# Patient Record
Sex: Female | Born: 1935 | Race: White | Hispanic: No | Smoking: Former smoker
Health system: Southern US, Community
[De-identification: ages and names within clinical notes are randomized; demographics above are authoritative.]

## PROBLEM LIST (undated history)

## (undated) DIAGNOSIS — I350 Nonrheumatic aortic (valve) stenosis: Secondary | ICD-10-CM

## (undated) DIAGNOSIS — I10 Essential (primary) hypertension: Secondary | ICD-10-CM

## (undated) DIAGNOSIS — R0600 Dyspnea, unspecified: Secondary | ICD-10-CM

## (undated) DIAGNOSIS — T7840XA Allergy, unspecified, initial encounter: Secondary | ICD-10-CM

## (undated) DIAGNOSIS — F429 Obsessive-compulsive disorder, unspecified: Secondary | ICD-10-CM

## (undated) DIAGNOSIS — F419 Anxiety disorder, unspecified: Secondary | ICD-10-CM

## (undated) DIAGNOSIS — I639 Cerebral infarction, unspecified: Secondary | ICD-10-CM

## (undated) DIAGNOSIS — R748 Abnormal levels of other serum enzymes: Secondary | ICD-10-CM

## (undated) DIAGNOSIS — W19XXXA Unspecified fall, initial encounter: Secondary | ICD-10-CM

## (undated) DIAGNOSIS — I951 Orthostatic hypotension: Secondary | ICD-10-CM

## (undated) DIAGNOSIS — I6529 Occlusion and stenosis of unspecified carotid artery: Secondary | ICD-10-CM

## (undated) DIAGNOSIS — J471 Bronchiectasis with (acute) exacerbation: Secondary | ICD-10-CM

## (undated) DIAGNOSIS — I2510 Atherosclerotic heart disease of native coronary artery without angina pectoris: Secondary | ICD-10-CM

## (undated) DIAGNOSIS — I0981 Rheumatic heart failure: Secondary | ICD-10-CM

## (undated) DIAGNOSIS — R42 Dizziness and giddiness: Secondary | ICD-10-CM

## (undated) DIAGNOSIS — I5181 Takotsubo syndrome: Secondary | ICD-10-CM

## (undated) DIAGNOSIS — J302 Other seasonal allergic rhinitis: Secondary | ICD-10-CM

## (undated) DIAGNOSIS — E785 Hyperlipidemia, unspecified: Secondary | ICD-10-CM

## (undated) DIAGNOSIS — I480 Paroxysmal atrial fibrillation: Secondary | ICD-10-CM

## (undated) DIAGNOSIS — K219 Gastro-esophageal reflux disease without esophagitis: Secondary | ICD-10-CM

## (undated) DIAGNOSIS — M1990 Unspecified osteoarthritis, unspecified site: Secondary | ICD-10-CM

## (undated) DIAGNOSIS — R011 Cardiac murmur, unspecified: Secondary | ICD-10-CM

## (undated) DIAGNOSIS — N289 Disorder of kidney and ureter, unspecified: Secondary | ICD-10-CM

## (undated) DIAGNOSIS — K649 Unspecified hemorrhoids: Secondary | ICD-10-CM

## (undated) DIAGNOSIS — K7469 Other cirrhosis of liver: Secondary | ICD-10-CM

## (undated) DIAGNOSIS — Z9289 Personal history of other medical treatment: Secondary | ICD-10-CM

## (undated) DIAGNOSIS — R5381 Other malaise: Secondary | ICD-10-CM

## (undated) DIAGNOSIS — I3139 Other pericardial effusion (noninflammatory): Secondary | ICD-10-CM

## (undated) DIAGNOSIS — E8770 Fluid overload, unspecified: Secondary | ICD-10-CM

## (undated) DIAGNOSIS — F0631 Mood disorder due to known physiological condition with depressive features: Secondary | ICD-10-CM

## (undated) HISTORY — DX: Other malaise: R53.81

## (undated) HISTORY — DX: Cardiac murmur, unspecified: R01.1

## (undated) HISTORY — DX: Unspecified fall, initial encounter: W19.XXXA

## (undated) HISTORY — DX: Obsessive-compulsive disorder, unspecified: F42.9

## (undated) HISTORY — DX: Dizziness and giddiness: R42

## (undated) HISTORY — DX: Other cirrhosis of liver: K74.69

## (undated) HISTORY — DX: Rheumatic heart failure: I09.81

## (undated) HISTORY — DX: Cerebral infarction, unspecified: I63.9

## (undated) HISTORY — DX: Abnormal levels of other serum enzymes: R74.8

## (undated) HISTORY — DX: Other pericardial effusion (noninflammatory): I31.39

## (undated) HISTORY — DX: Personal history of other medical treatment: Z92.89

## (undated) HISTORY — DX: Mood disorder due to known physiological condition with depressive features: F06.31

## (undated) HISTORY — DX: Disorder of kidney and ureter, unspecified: N28.9

## (undated) HISTORY — DX: Bronchiectasis with (acute) exacerbation: J47.1

## (undated) HISTORY — DX: Nonrheumatic aortic (valve) stenosis: I35.0

## (undated) HISTORY — DX: Occlusion and stenosis of unspecified carotid artery: I65.29

## (undated) HISTORY — PX: COLONOSCOPY: SHX174

## (undated) HISTORY — PX: UPPER GI ENDOSCOPY: SHX6162

## (undated) HISTORY — DX: Allergy, unspecified, initial encounter: T78.40XA

## (undated) HISTORY — DX: Orthostatic hypotension: I95.1

## (undated) HISTORY — PX: TONSILLECTOMY: SUR1361

## (undated) HISTORY — DX: Fluid overload, unspecified: E87.70

## (undated) HISTORY — PX: EYE SURGERY: SHX253

---

## 2007-10-24 ENCOUNTER — Encounter: Admission: RE | Admit: 2007-10-24 | Discharge: 2007-10-24 | Payer: Self-pay | Admitting: Family Medicine

## 2008-09-15 ENCOUNTER — Ambulatory Visit: Payer: Self-pay | Admitting: Diagnostic Radiology

## 2008-09-15 ENCOUNTER — Ambulatory Visit (HOSPITAL_BASED_OUTPATIENT_CLINIC_OR_DEPARTMENT_OTHER): Admission: RE | Admit: 2008-09-15 | Discharge: 2008-09-15 | Payer: Self-pay | Admitting: Family Medicine

## 2008-10-20 ENCOUNTER — Ambulatory Visit: Payer: Self-pay | Admitting: Radiology

## 2008-10-20 ENCOUNTER — Ambulatory Visit (HOSPITAL_BASED_OUTPATIENT_CLINIC_OR_DEPARTMENT_OTHER): Admission: RE | Admit: 2008-10-20 | Discharge: 2008-10-20 | Payer: Self-pay | Admitting: Family Medicine

## 2009-10-26 ENCOUNTER — Ambulatory Visit: Payer: Self-pay | Admitting: Diagnostic Radiology

## 2009-10-26 ENCOUNTER — Ambulatory Visit (HOSPITAL_BASED_OUTPATIENT_CLINIC_OR_DEPARTMENT_OTHER): Admission: RE | Admit: 2009-10-26 | Discharge: 2009-10-26 | Payer: Self-pay | Admitting: Family Medicine

## 2010-10-06 ENCOUNTER — Ambulatory Visit (HOSPITAL_BASED_OUTPATIENT_CLINIC_OR_DEPARTMENT_OTHER)
Admission: RE | Admit: 2010-10-06 | Discharge: 2010-10-06 | Disposition: A | Discharge status: Home / Self Care | Payer: Medicare Other | Source: Ambulatory Visit | Attending: Specialist | Admitting: Specialist

## 2010-10-06 ENCOUNTER — Ambulatory Visit (HOSPITAL_COMMUNITY): Admission: RE | Admit: 2010-10-06 | Payer: Medicare Other | Source: Ambulatory Visit

## 2010-10-06 DIAGNOSIS — Z01810 Encounter for preprocedural cardiovascular examination: Secondary | ICD-10-CM | POA: Insufficient documentation

## 2010-10-06 DIAGNOSIS — H269 Unspecified cataract: Secondary | ICD-10-CM | POA: Insufficient documentation

## 2010-10-06 DIAGNOSIS — Z01812 Encounter for preprocedural laboratory examination: Secondary | ICD-10-CM | POA: Insufficient documentation

## 2010-10-06 DIAGNOSIS — Z79899 Other long term (current) drug therapy: Secondary | ICD-10-CM | POA: Insufficient documentation

## 2010-10-06 DIAGNOSIS — I10 Essential (primary) hypertension: Secondary | ICD-10-CM | POA: Insufficient documentation

## 2010-10-06 LAB — POCT I-STAT 4, (NA,K, GLUC, HGB,HCT)
HCT: 38 % (ref 36.0–46.0)
Sodium: 137 mEq/L (ref 135–145)

## 2010-10-14 ENCOUNTER — Other Ambulatory Visit (HOSPITAL_BASED_OUTPATIENT_CLINIC_OR_DEPARTMENT_OTHER): Payer: Self-pay | Admitting: Family Medicine

## 2010-10-14 DIAGNOSIS — Z1239 Encounter for other screening for malignant neoplasm of breast: Secondary | ICD-10-CM

## 2010-11-01 ENCOUNTER — Ambulatory Visit (HOSPITAL_BASED_OUTPATIENT_CLINIC_OR_DEPARTMENT_OTHER)
Admission: RE | Admit: 2010-11-01 | Discharge: 2010-11-01 | Disposition: A | Discharge status: Home / Self Care | Payer: Medicare Other | Source: Ambulatory Visit | Attending: Family Medicine | Admitting: Family Medicine

## 2010-11-01 DIAGNOSIS — Z1231 Encounter for screening mammogram for malignant neoplasm of breast: Secondary | ICD-10-CM | POA: Insufficient documentation

## 2010-11-01 DIAGNOSIS — Z1239 Encounter for other screening for malignant neoplasm of breast: Secondary | ICD-10-CM

## 2010-11-10 ENCOUNTER — Ambulatory Visit (HOSPITAL_BASED_OUTPATIENT_CLINIC_OR_DEPARTMENT_OTHER)
Admission: RE | Admit: 2010-11-10 | Discharge: 2010-11-10 | Disposition: A | Discharge status: Home / Self Care | Payer: Medicare Other | Source: Ambulatory Visit | Attending: Specialist | Admitting: Specialist

## 2010-11-10 DIAGNOSIS — H269 Unspecified cataract: Secondary | ICD-10-CM | POA: Insufficient documentation

## 2011-02-18 NOTE — Op Note (Signed)
  NAME:  Barbara Mcconnell, Barbara Mcconnell NO.:  [id number]  MEDICAL RECORD NO.:  [id number]          PATIENT TYPE:  LOCATION:                                 FACILITY:  PHYSICIAN:  Chucky May, M.D.  DATE OF BIRTH:  Feb 21, 1935  DATE OF PROCEDURE: DATE OF DISCHARGE:                              OPERATIVE REPORT   SURGEON:  Chucky May, M.D.  ANESTHESIA:  MAC.  PREOPERATIVE DIAGNOSIS:  Cataract, left eye.  POSTOPERATIVE DIAGNOSIS:  Cataract, left eye.  OPERATION PERFORMED:  Cataract extraction by phacoemulsification with intraocular lens implant, left eye.  INDICATIONS FOR SURGERY:  The patient is a 76 year old female with painless progressive decrease in vision so that she has difficulty seeing for driving and reading.  On examination, she was found to have a cataract consistent with decrease in visual acuity.  DESCRIPTION OF PROCEDURE:  The patient was brought to the main operating room and placed in the supine position.  Anesthesia was obtained by means of topical 4% lidocaine drops and tetracaine.  Additional intravenous sedation was given by Anesthesia.  A lid speculum was inserted and the conjunctiva was irrigated with Balanced Salt Solution. The cornea was entered temporally with a 2.4-mm keratome with an additional port inferiorly.  Viscoelastic was instilled into the anterior chamber and an anterior capsulorrhexis was performed without difficulty.  The nucleus was then mobilized by hydrodissection followed by phacoemulsification of the nucleus and removal of residual cortical material by irrigation-aspiration.  The posterior capsule was then polished and a posterior chamber lens implant was placed in the bag, model [id number], power 22.0 diopters, without difficulty.  Residual viscoelastic was then removed by irrigation-aspiration and replaced with Balanced Salt Solution.  The wound was hydrated with Balanced Salt Solution and checked for fluid leaks and none  were noted.  The eye was dressed with topical Pred Forte, Vigamox and a Fox shield, and the patient was taken to the recovery room where she received written and verbal instructions for postoperative care and was scheduled for followup in 24 hours.          ______________________________ Chucky May, M.D.     DJD/MEDQ  D:  11/10/2010  T:  11/11/2010  Job:  [id number]  Electronically Signed by Nelson Chimes M.D. on 02/18/2011 08:58:12 AM

## 2011-04-19 ENCOUNTER — Encounter: Payer: Self-pay | Admitting: *Deleted

## 2011-04-19 ENCOUNTER — Emergency Department (HOSPITAL_BASED_OUTPATIENT_CLINIC_OR_DEPARTMENT_OTHER)
Admission: EM | Admit: 2011-04-19 | Discharge: 2011-04-19 | Disposition: A | Discharge status: Home / Self Care | Payer: Medicare Other | Attending: Emergency Medicine | Admitting: Emergency Medicine

## 2011-04-19 ENCOUNTER — Emergency Department (INDEPENDENT_AMBULATORY_CARE_PROVIDER_SITE_OTHER): Payer: Medicare Other

## 2011-04-19 DIAGNOSIS — M25569 Pain in unspecified knee: Secondary | ICD-10-CM | POA: Insufficient documentation

## 2011-04-19 DIAGNOSIS — I10 Essential (primary) hypertension: Secondary | ICD-10-CM | POA: Insufficient documentation

## 2011-04-19 DIAGNOSIS — IMO0002 Reserved for concepts with insufficient information to code with codable children: Secondary | ICD-10-CM

## 2011-04-19 DIAGNOSIS — M1711 Unilateral primary osteoarthritis, right knee: Secondary | ICD-10-CM

## 2011-04-19 DIAGNOSIS — E785 Hyperlipidemia, unspecified: Secondary | ICD-10-CM | POA: Insufficient documentation

## 2011-04-19 DIAGNOSIS — M1710 Unilateral primary osteoarthritis, unspecified knee: Secondary | ICD-10-CM

## 2011-04-19 DIAGNOSIS — M1120 Other chondrocalcinosis, unspecified site: Secondary | ICD-10-CM

## 2011-04-19 HISTORY — DX: Essential (primary) hypertension: I10

## 2011-04-19 HISTORY — DX: Hyperlipidemia, unspecified: E78.5

## 2011-04-19 NOTE — ED Notes (Signed)
Pt c/o right knee pain x 3 weeks w/o injury.

## 2011-04-19 NOTE — ED Provider Notes (Signed)
History     CSN: [id number] Arrival date & time: 04/19/2011  3:14 PM  Chief Complaint  Patient presents with  . Knee Pain    HPI  (Consider location/radiation/quality/duration/timing/severity/associated sxs/prior treatment)  HPI This is a 76 year old female comes in today complaining of right knee pain. She states that she has had increasing pain over several weeks. It is more severe today. She has not had any known trauma. She feels that the pain is in the medial aspect of the knee. She has not had any redness but feels that there may be some swelling. Pain when she puts weight on the leg. He denies any calf swelling or foot edema. She has no history of DVT.  Past Medical History  Diagnosis Date  . Hypertension   . Hyperlipemia     Past Surgical History  Procedure Date  . Tonsillectomy     History reviewed. No pertinent family history.  History  Substance Use Topics  . Smoking status: Never Smoker   . Smokeless tobacco: Not on file  . Alcohol Use: No    OB History    Grav Para Term Preterm Abortions TAB SAB Ect Mult Living                  Review of Systems  Review of Systems  All other systems reviewed and are negative.    Allergies  Review of patient's allergies indicates no known allergies.  Home Medications   Current Outpatient Rx  Name Route Sig Dispense Refill  . FEXOFENADINE HCL 180 MG PO TABS Oral Take 180 mg by mouth daily.      . IBUPROFEN 200 MG PO TABS Oral Take 200 mg by mouth every 6 (six) hours as needed. For pain     . LISINOPRIL-HYDROCHLOROTHIAZIDE 20-25 MG PO TABS Oral Take 1 tablet by mouth daily.      Marland Kitchen METOPROLOL SUCCINATE 25 MG PO TB24 Oral Take 25 mg by mouth daily.      Marland Kitchen ONE-DAILY MULTI VITAMINS PO TABS Oral Take 1 tablet by mouth daily.      Marland Kitchen FISH OIL 1200 MG PO CAPS Oral Take 1 capsule by mouth daily.      Marland Kitchen ROSUVASTATIN CALCIUM 10 MG PO TABS Oral Take 10 mg by mouth daily.      Marland Kitchen VISINE OP Ophthalmic Apply 1 drop to eye  daily.        Physical Exam    BP 160/86  Pulse 72  Temp(Src) 98.7 F (37.1 C) (Oral)  Resp 16  Ht 5\' 1"  (1.549 m)  Wt 145 lb (65.772 kg)  BMI 27.40 kg/m2  SpO2 99%  Physical Exam  Well-developed well-nourished 76 year old female sitting on the bed in no acute distress vital signs are stable except for blood pressure elevated at 160/86 HEENT normocephalic atraumatic neck is supple nontender to palpation Chest wall is normal lungs are clear auscultation Heart is regular rate and rhythm Abdomen is soft and nontender Extremities are normal with the exception of some tenderness over the medial aspect of the right knee. Effusion is noted. There is no tenderness of the calf or the medial aspect of the right thigh. She has full active range of motion of the knee. She has pain with no collateral ligament.  ED Course  Procedures (including critical care time)  Labs Reviewed - No data to display Dg Knee 1-2 Views Right  04/19/2011  *RADIOLOGY REPORT*  Clinical Data: Anterior and medial right knee pain, no injury  RIGHT KNEE - 1-2 VIEW  Comparison: None.  Findings:  The AP radiograph is limited due to obliquity.  No fracture or dislocation. Minimal medial compartmental joint space degenerative change and spurring of the tibial spines.  Chondrocalcinosis within the medial and lateral joint spaces as well as the patellofemoral joint.  No definite joint effusion.  Minimal enthesopathic changes of the insertion site of the quadriceps tendon.  Regional soft tissues are normal.  IMPRESSION: 1.  Minimal medial compartmental joint space degenerative change.  2.  Chondrocalcinosis compatible with CPPD.  Original Report Authenticated By: Waynard Reeds, M.D.     No diagnosis found.   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 She is advised regarding this and treatment. She is given a referral for followup.         Hilario Quarry, MD 04/19/11 1800

## 2011-10-12 ENCOUNTER — Other Ambulatory Visit (HOSPITAL_BASED_OUTPATIENT_CLINIC_OR_DEPARTMENT_OTHER): Payer: Self-pay | Admitting: Family Medicine

## 2011-10-12 DIAGNOSIS — Z1231 Encounter for screening mammogram for malignant neoplasm of breast: Secondary | ICD-10-CM

## 2011-11-14 ENCOUNTER — Ambulatory Visit (HOSPITAL_BASED_OUTPATIENT_CLINIC_OR_DEPARTMENT_OTHER)
Admission: RE | Admit: 2011-11-14 | Discharge: 2011-11-14 | Disposition: A | Discharge status: Home / Self Care | Payer: Medicare Other | Source: Ambulatory Visit | Attending: Family Medicine | Admitting: Family Medicine

## 2011-11-14 DIAGNOSIS — Z1231 Encounter for screening mammogram for malignant neoplasm of breast: Secondary | ICD-10-CM | POA: Insufficient documentation

## 2012-10-15 ENCOUNTER — Other Ambulatory Visit: Payer: Self-pay

## 2012-10-15 DIAGNOSIS — Z1231 Encounter for screening mammogram for malignant neoplasm of breast: Secondary | ICD-10-CM

## 2012-11-27 ENCOUNTER — Ambulatory Visit
Admission: RE | Admit: 2012-11-27 | Discharge: 2012-11-27 | Disposition: A | Discharge status: Home / Self Care | Payer: Medicare Other | Source: Ambulatory Visit

## 2012-11-27 DIAGNOSIS — Z1231 Encounter for screening mammogram for malignant neoplasm of breast: Secondary | ICD-10-CM

## 2013-05-11 ENCOUNTER — Encounter (HOSPITAL_BASED_OUTPATIENT_CLINIC_OR_DEPARTMENT_OTHER): Payer: Self-pay | Admitting: Emergency Medicine

## 2013-05-11 ENCOUNTER — Emergency Department (HOSPITAL_BASED_OUTPATIENT_CLINIC_OR_DEPARTMENT_OTHER)
Admission: EM | Admit: 2013-05-11 | Discharge: 2013-05-11 | Disposition: A | Discharge status: Skilled Nursing Facility | Payer: Medicare Other | Attending: Emergency Medicine | Admitting: Emergency Medicine

## 2013-05-11 ENCOUNTER — Emergency Department (HOSPITAL_BASED_OUTPATIENT_CLINIC_OR_DEPARTMENT_OTHER): Payer: Medicare Other

## 2013-05-11 DIAGNOSIS — E785 Hyperlipidemia, unspecified: Secondary | ICD-10-CM | POA: Insufficient documentation

## 2013-05-11 DIAGNOSIS — I10 Essential (primary) hypertension: Secondary | ICD-10-CM | POA: Insufficient documentation

## 2013-05-11 DIAGNOSIS — R6883 Chills (without fever): Secondary | ICD-10-CM | POA: Insufficient documentation

## 2013-05-11 DIAGNOSIS — I219 Acute myocardial infarction, unspecified: Secondary | ICD-10-CM | POA: Insufficient documentation

## 2013-05-11 DIAGNOSIS — R42 Dizziness and giddiness: Secondary | ICD-10-CM | POA: Insufficient documentation

## 2013-05-11 DIAGNOSIS — I214 Non-ST elevation (NSTEMI) myocardial infarction: Secondary | ICD-10-CM

## 2013-05-11 DIAGNOSIS — Z79899 Other long term (current) drug therapy: Secondary | ICD-10-CM | POA: Insufficient documentation

## 2013-05-11 DIAGNOSIS — R0789 Other chest pain: Secondary | ICD-10-CM | POA: Insufficient documentation

## 2013-05-11 DIAGNOSIS — R110 Nausea: Secondary | ICD-10-CM | POA: Insufficient documentation

## 2013-05-11 LAB — PROTIME-INR: Prothrombin Time: 12.6 seconds (ref 11.6–15.2)

## 2013-05-11 LAB — CBC WITH DIFFERENTIAL/PLATELET
Basophils Relative: 0 % (ref 0–1)
Eosinophils Absolute: 0.2 10*3/uL (ref 0.0–0.7)
Hemoglobin: 12 g/dL (ref 12.0–15.0)
MCH: 30.7 pg (ref 26.0–34.0)
MCHC: 34.6 g/dL (ref 30.0–36.0)
MCV: 88.7 fL (ref 78.0–100.0)
Monocytes Relative: 8 % (ref 3–12)
Neutro Abs: 3.6 10*3/uL (ref 1.7–7.7)
WBC: 7.1 10*3/uL (ref 4.0–10.5)

## 2013-05-11 LAB — COMPREHENSIVE METABOLIC PANEL
ALT: 26 U/L (ref 0–35)
AST: 29 U/L (ref 0–37)
Albumin: 4.1 g/dL (ref 3.5–5.2)
Alkaline Phosphatase: 39 U/L (ref 39–117)
BUN: 52 mg/dL — ABNORMAL HIGH (ref 6–23)
CO2: 23 mEq/L (ref 19–32)
Chloride: 99 mEq/L (ref 96–112)
Creatinine, Ser: 1.7 mg/dL — ABNORMAL HIGH (ref 0.50–1.10)
GFR calc non Af Amer: 28 mL/min — ABNORMAL LOW (ref >90)

## 2013-05-11 MED ORDER — HEPARIN (PORCINE) IN NACL 100-0.45 UNIT/ML-% IJ SOLN
INTRAMUSCULAR | Status: AC
  Administered 2013-05-11: 1000 [IU]/h
  Filled 2013-05-11: qty 250

## 2013-05-11 MED ORDER — SODIUM CHLORIDE 0.9 % IV BOLUS (SEPSIS)
500.0000 mL | Freq: Once | INTRAVENOUS | Status: AC
  Administered 2013-05-11: 500 mL via INTRAVENOUS

## 2013-05-11 MED ORDER — SODIUM CHLORIDE 0.9 % IV SOLN
INTRAVENOUS | Status: DC
  Administered 2013-05-11: 20:00:00 via INTRAVENOUS

## 2013-05-11 MED ORDER — ONDANSETRON HCL 4 MG/2ML IJ SOLN
INTRAMUSCULAR | Status: AC
  Administered 2013-05-11: 4 mg
  Filled 2013-05-11: qty 2

## 2013-05-11 NOTE — ED Notes (Signed)
Lab called and states troponin is 0.57. Dr. Radford Pax aware.

## 2013-05-11 NOTE — ED Notes (Signed)
Condition stable and unchanged stable denies pain or SOB skin pale pink warm and dry pt alert x4 NSR on monitor IV site WNL heparin infusing at 1000 units per hr

## 2013-05-11 NOTE — ED Notes (Signed)
Pt states she has had heart palpitations all day. Also c/o cold sweat and nausea.

## 2013-05-11 NOTE — ED Provider Notes (Signed)
CSN: [id number]     Arrival date & time 05/11/13  1903 History  This chart was scribed for Barbara Shi, MD by Danella Maiers, ED Scribe. This patient was seen in room MH02/MH02 and the patient's care was started at 7:13 PM.   Chief Complaint  Patient presents with  . Palpitations   HPI HPI Comments: Loetta Connelley is a 78 y.o. female with a h/o hypertension who presents to the Emergency Department complaining of sudden-onset heart palpitations with associated chills and nausea after getting out of the shower tonight. She reports feeling tightness in her chest and posterior neck and feeling like she was going to pass out. She states she was feeling great all day until after she got out of the shower. Her symptoms have since improved but she still feels "weak". She has been working outside the last two days. She has never felt this way before. She has no h/o heart problems. She is compliant with her blood pressure medications.  Past Medical History  Diagnosis Date  . Hypertension   . Hyperlipemia    Past Surgical History  Procedure Laterality Date  . Tonsillectomy     History reviewed. No pertinent family history. History  Substance Use Topics  . Smoking status: Never Smoker   . Smokeless tobacco: Not on file  . Alcohol Use: No   OB History   Grav Para Term Preterm Abortions TAB SAB Ect Mult Living                 Review of Systems A complete 10 system review of systems was obtained and all systems are negative except as noted in the HPI and PMH.   Allergies  Review of patient's allergies indicates no known allergies.  Home Medications   Current Outpatient Rx  Name  Route  Sig  Dispense  Refill  . fexofenadine (ALLEGRA) 180 MG tablet   Oral   Take 180 mg by mouth daily.           Marland Kitchen ibuprofen (ADVIL,MOTRIN) 200 MG tablet   Oral   Take 200 mg by mouth every 6 (six) hours as needed. For pain          . lisinopril-hydrochlorothiazide (PRINZIDE,ZESTORETIC) 20-25 MG  per tablet   Oral   Take 1 tablet by mouth daily.           . metoprolol succinate (TOPROL-XL) 25 MG 24 hr tablet   Oral   Take 25 mg by mouth daily.           . Multiple Vitamin (MULTIVITAMIN) tablet   Oral   Take 1 tablet by mouth daily.           . Omega-3 Fatty Acids (FISH OIL) 1200 MG CAPS   Oral   Take 1 capsule by mouth daily.           . rosuvastatin (CRESTOR) 10 MG tablet   Oral   Take 10 mg by mouth daily.           . Tetrahydrozoline HCl (VISINE OP)   Ophthalmic   Apply 1 drop to eye daily.            BP 117/90  Pulse 96  Temp(Src) 98.6 F (37 C) (Oral)  Resp 20  Ht 5\' 2"  (1.575 m)  Wt 145 lb (65.772 kg)  BMI 26.51 kg/m2  SpO2 99% Physical Exam  Nursing note and vitals reviewed. Constitutional: She is oriented to person, place, and time. She  appears well-developed and well-nourished. No distress.  HENT:  Head: Normocephalic and atraumatic.  Eyes: Pupils are equal, round, and reactive to light.  Neck: Normal range of motion.  Cardiovascular: Normal rate and intact distal pulses.   Pulmonary/Chest: No respiratory distress. She has no wheezes. She has no rales.  Abdominal: Normal appearance. She exhibits no distension.  Musculoskeletal: Normal range of motion.  Neurological: She is alert and oriented to person, place, and time. No cranial nerve deficit.  Skin: Skin is warm and dry. No rash noted.  Psychiatric: She has a normal mood and affect. Her behavior is normal.    ED Course  Procedures (including critical care time)  CRITICAL CARE Performed by: Nelva Nay L Total critical care time: 45 min Critical care time was exclusive of separately billable procedures and treating other patients. Critical care was necessary to treat or prevent imminent or life-threatening deterioration. Critical care was time spent personally by me on the following activities: development of treatment plan with patient and/or surrogate as well as nursing,  discussions with consultants, evaluation of patient's response to treatment, examination of patient, obtaining history from patient or surrogate, ordering and performing treatments and interventions, ordering and review of laboratory studies, ordering and review of radiographic studies, pulse oximetry and re-evaluation of patient's condition.  Medications  0.9 %  sodium chloride infusion ( Intravenous New Bag/Given 05/11/13 2001)  sodium chloride 0.9 % bolus 500 mL (500 mLs Intravenous New Bag/Given 05/11/13 2001)  ondansetron (ZOFRAN) 4 MG/2ML injection (4 mg  Given 05/11/13 2018)  heparin 100-0.45 UNIT/ML-% infusion (1,000 Units/hr  New Bag/Given 05/11/13 2050)     DIAGNOSTIC STUDIES: Oxygen Saturation is 99% on RA, normal by my interpretation.    COORDINATION OF CARE: 7:28 PM- Discussed treatment plan with pt which includes blood work, orthostatics, IV fluids. Pt agrees to plan.    Labs Review Labs Reviewed  COMPREHENSIVE METABOLIC PANEL - Abnormal; Notable for the following:    Glucose, Bld 123 (*)    BUN 52 (*)    Creatinine, Ser 1.70 (*)    GFR calc non Af Amer 28 (*)    GFR calc Af Amer 33 (*)    All other components within normal limits  CBC WITH DIFFERENTIAL - Abnormal; Notable for the following:    HCT 34.7 (*)    All other components within normal limits  TROPONIN I - Abnormal; Notable for the following:    Troponin I 0.57 (*)    All other components within normal limits  PROTIME-INR   Imaging Review No results found.   Date: 05/11/2013  Rate: 76  Rhythm: normal sinus rhythm  QRS Axis: normal  Intervals: normal  ST/T Wave abnormalities: normal  Conduction Disutrbances: none  Narrative Interpretation: unremarkable  Repeat EKG prior to discharge showed no change in ST segments.   Case was discussed with cardiologist at [facility].  Patient will be transferred there at her request.  Patient given heparin IV prior to transfer.   MDM   1.  NSTEMI (non-ST elevated myocardial infarction)        I personally performed the services described in this documentation, which was scribed in my presence. The recorded information has been reviewed and considered.   Barbara Shi, MD 05/17/13 2007

## 2013-09-23 ENCOUNTER — Emergency Department (HOSPITAL_BASED_OUTPATIENT_CLINIC_OR_DEPARTMENT_OTHER): Payer: Medicare HMO

## 2013-09-23 ENCOUNTER — Emergency Department (HOSPITAL_BASED_OUTPATIENT_CLINIC_OR_DEPARTMENT_OTHER)
Admission: EM | Admit: 2013-09-23 | Discharge: 2013-09-23 | Disposition: A | Discharge status: Home / Self Care | Payer: Medicare HMO | Attending: Emergency Medicine | Admitting: Emergency Medicine

## 2013-09-23 ENCOUNTER — Encounter (HOSPITAL_BASED_OUTPATIENT_CLINIC_OR_DEPARTMENT_OTHER): Payer: Self-pay | Admitting: Emergency Medicine

## 2013-09-23 DIAGNOSIS — R141 Gas pain: Secondary | ICD-10-CM | POA: Insufficient documentation

## 2013-09-23 DIAGNOSIS — R143 Flatulence: Secondary | ICD-10-CM

## 2013-09-23 DIAGNOSIS — I2510 Atherosclerotic heart disease of native coronary artery without angina pectoris: Secondary | ICD-10-CM | POA: Insufficient documentation

## 2013-09-23 DIAGNOSIS — Z79899 Other long term (current) drug therapy: Secondary | ICD-10-CM | POA: Insufficient documentation

## 2013-09-23 DIAGNOSIS — R6883 Chills (without fever): Secondary | ICD-10-CM | POA: Insufficient documentation

## 2013-09-23 DIAGNOSIS — R1032 Left lower quadrant pain: Secondary | ICD-10-CM | POA: Insufficient documentation

## 2013-09-23 DIAGNOSIS — R142 Eructation: Secondary | ICD-10-CM | POA: Insufficient documentation

## 2013-09-23 DIAGNOSIS — I10 Essential (primary) hypertension: Secondary | ICD-10-CM | POA: Insufficient documentation

## 2013-09-23 DIAGNOSIS — R109 Unspecified abdominal pain: Secondary | ICD-10-CM

## 2013-09-23 DIAGNOSIS — E785 Hyperlipidemia, unspecified: Secondary | ICD-10-CM | POA: Insufficient documentation

## 2013-09-23 DIAGNOSIS — M25519 Pain in unspecified shoulder: Secondary | ICD-10-CM | POA: Insufficient documentation

## 2013-09-23 DIAGNOSIS — R911 Solitary pulmonary nodule: Secondary | ICD-10-CM | POA: Insufficient documentation

## 2013-09-23 HISTORY — DX: Atherosclerotic heart disease of native coronary artery without angina pectoris: I25.10

## 2013-09-23 LAB — CBC WITH DIFFERENTIAL/PLATELET
BASOS ABS: 0 10*3/uL (ref 0.0–0.1)
Basophils Relative: 0 % (ref 0–1)
EOS PCT: 1 % (ref 0–5)
Eosinophils Absolute: 0.1 10*3/uL (ref 0.0–0.7)
HEMATOCRIT: 37.9 % (ref 36.0–46.0)
HEMOGLOBIN: 13 g/dL (ref 12.0–15.0)
LYMPHS ABS: 2.9 10*3/uL (ref 0.7–4.0)
LYMPHS PCT: 42 % (ref 12–46)
MCH: 29.7 pg (ref 26.0–34.0)
MCHC: 34.3 g/dL (ref 30.0–36.0)
MCV: 86.7 fL (ref 78.0–100.0)
MONO ABS: 0.5 10*3/uL (ref 0.1–1.0)
Monocytes Relative: 7 % (ref 3–12)
Neutro Abs: 3.4 10*3/uL (ref 1.7–7.7)
Neutrophils Relative %: 50 % (ref 43–77)
Platelets: 232 10*3/uL (ref 150–400)
RBC: 4.37 MIL/uL (ref 3.87–5.11)
RDW: 12.8 % (ref 11.5–15.5)
WBC: 6.9 10*3/uL (ref 4.0–10.5)

## 2013-09-23 LAB — COMPREHENSIVE METABOLIC PANEL
ALT: 19 U/L (ref 0–35)
AST: 22 U/L (ref 0–37)
Albumin: 4.1 g/dL (ref 3.5–5.2)
Alkaline Phosphatase: 55 U/L (ref 39–117)
BUN: 21 mg/dL (ref 6–23)
CHLORIDE: 95 meq/L — AB (ref 96–112)
CO2: 27 meq/L (ref 19–32)
CREATININE: 1.1 mg/dL (ref 0.50–1.10)
Calcium: 9.9 mg/dL (ref 8.4–10.5)
GFR, EST AFRICAN AMERICAN: 55 mL/min — AB (ref >90)
GFR, EST NON AFRICAN AMERICAN: 48 mL/min — AB (ref >90)
Glucose, Bld: 104 mg/dL — ABNORMAL HIGH (ref 70–99)
Potassium: 4 mEq/L (ref 3.7–5.3)
Sodium: 135 mEq/L — ABNORMAL LOW (ref 137–147)
Total Bilirubin: 0.7 mg/dL (ref 0.3–1.2)
Total Protein: 7.5 g/dL (ref 6.0–8.3)

## 2013-09-23 LAB — LIPASE, BLOOD: LIPASE: 39 U/L (ref 11–59)

## 2013-09-23 LAB — TROPONIN I
Troponin I: <0.3 ng/mL (ref <0.30)
Troponin I: <0.3 ng/mL (ref <0.30)

## 2013-09-23 MED ORDER — IOHEXOL 300 MG/ML  SOLN
80.0000 mL | Freq: Once | INTRAMUSCULAR | Status: AC | PRN
  Administered 2013-09-23: 80 mL via INTRAVENOUS

## 2013-09-23 MED ORDER — IOHEXOL 300 MG/ML  SOLN
50.0000 mL | Freq: Once | INTRAMUSCULAR | Status: AC | PRN
  Administered 2013-09-23: 50 mL via ORAL

## 2013-09-23 MED ORDER — PANTOPRAZOLE SODIUM 40 MG IV SOLR
40.0000 mg | Freq: Once | INTRAVENOUS | Status: DC
  Filled 2013-09-23: qty 40

## 2013-09-23 MED ORDER — ALUM & MAG HYDROXIDE-SIMETH 200-200-20 MG/5ML PO SUSP
15.0000 mL | Freq: Once | ORAL | Status: AC
  Administered 2013-09-23: 15 mL via ORAL
  Filled 2013-09-23: qty 30

## 2013-09-23 NOTE — Discharge Instructions (Signed)
Abdominal Pain, Adult Many things can cause abdominal pain. Usually, abdominal pain is not caused by a disease and will improve without treatment. It can often be observed and treated at home. Your health care provider will do a physical exam and possibly order blood tests and X-rays to help determine the seriousness of your pain. However, in many cases, more time must pass before a clear cause of the pain can be found. Before that point, your health care provider may not know if you need more testing or further treatment. HOME CARE INSTRUCTIONS  Monitor your abdominal pain for any changes. The following actions may help to alleviate any discomfort you are experiencing:  Only take over-the-counter or prescription medicines as directed by your health care provider.  Do not take laxatives unless directed to do so by your health care provider.  Try a clear liquid diet (broth, tea, or water) as directed by your health care provider. Slowly move to a bland diet as tolerated. SEEK MEDICAL CARE IF:  You have unexplained abdominal pain.  You have abdominal pain associated with nausea or diarrhea.  You have pain when you urinate or have a bowel movement.  You experience abdominal pain that wakes you in the night.  You have abdominal pain that is worsened or improved by eating food.  You have abdominal pain that is worsened with eating fatty foods. SEEK IMMEDIATE MEDICAL CARE IF:   Your pain does not go away within 2 hours.  You have a fever.  You keep throwing up (vomiting).  Your pain is felt only in portions of the abdomen, such as the right side or the left lower portion of the abdomen.  You pass bloody or black tarry stools. MAKE SURE YOU:  Understand these instructions.   Will watch your condition.   Will get help right away if you are not doing well or get worse.  Document Released: 04/20/2005 Document Revised: 05/01/2013 Document Reviewed: 03/20/2013 [facility] Patient  Information 2014 [employer], [place].  Pulmonary Nodule A pulmonary nodule is a small, round growth of tissue in the lung. Pulmonary nodules can range in size from less than 1/5 inch (4 mm) to a little bigger than an inch (25 mm). Most pulmonary nodules are detected when imaging tests of the lung are being performed for a different problem. Pulmonary nodules are usually not cancerous (benign). However, some pulmonary nodules are cancerous (malignant). Follow-up treatment or testing is based on the size of the pulmonary nodule and your risk of getting lung cancer.  CAUSES Benign pulmonary nodules can be caused by various things. Some of the causes include:   Bacterial, fungal, or viral infections. This is usually an old infection that is no longer active, but it can sometimes be a current, active infection.  A benign mass of tissue.  Inflammation from conditions such as rheumatoid arthritis.   Abnormal blood vessels in the lungs. Malignant pulmonary nodules can result from lung cancer or from cancers that spread to the lung from other places in the body. SIGNS AND SYMPTOMS Pulmonary nodules usually do not cause symptoms. DIAGNOSIS Most often, pulmonary nodules are found incidentally when an X-ray or CT scan is performed to look for some other problem in the lung area. To help determine whether a pulmonary nodule is benign or malignant, your health care provider will take a medical history and order a variety of tests. Tests done may include:   Blood tests.  A skin test called a tuberculin test. This test is  used to determine if you have been exposed to the germ that causes tuberculosis.   Chest X-rays. If possible, a new X-ray may be compared with X-rays you have had in the past.   CT scan. This test shows smaller pulmonary nodules more clearly than an X-ray.   Positron emission tomography (PET) scan. In this test, a safe amount of a radioactive substance is injected into the bloodstream.  Then, the scan takes a picture of the pulmonary nodule. The radioactive substance is eliminated from your body in your urine.   Biopsy. A tiny piece of the pulmonary nodule is removed so it can be checked under a microscope. TREATMENT  Pulmonary nodules that are benign normally do not require any treatment because they usually do not cause symptoms or breathing problems. Your health care provider may want to monitor the pulmonary nodule through follow-up CT scans. The frequency of these CT scans will vary based on the size of the nodule and the risk factors for lung cancer. For example, CT scans will need to be done more frequently if the pulmonary nodule is larger and if you have a history of smoking and a family history of cancer. Further testing or biopsies may be done if any follow-up CT scan shows that the size of the pulmonary nodule has increased. HOME CARE INSTRUCTIONS  Only take over-the-counter or prescription medicines as directed by your health care provider.  Keep all follow-up appointments with your health care provider. SEEK MEDICAL CARE IF:  You have trouble breathing when you are active.   You feel sick or unusually tired.   You do not feel like eating.   You lose weight without trying to.   You develop chills or night sweats.  SEEK IMMEDIATE MEDICAL CARE IF:  You cannot catch your breath, or you begin wheezing.   You cannot stop coughing.   You cough up blood.   You become dizzy or feel like you are going to pass out.   You have sudden chest pain.   You have a fever or persistent symptoms for more than 2 3 days.   You have a fever and your symptoms suddenly get worse. MAKE SURE YOU:  Understand these instructions.  Will watch your condition.  Will get help right away if you are not doing well or get worse. Document Released: 05/08/2009 Document Revised: 03/13/2013 Document Reviewed: 12/31/2012 [facility] Patient Information 2014 [place],  [place].

## 2013-09-23 NOTE — ED Notes (Signed)
Pt drank all of po contrast at once, now belching and having abdominal discomfort, but refusing medication. Ct notified that she is ready for ct. Patient made aware of how to contact RN if anything is needed.

## 2013-09-23 NOTE — ED Notes (Signed)
MD at bedside. 

## 2013-09-23 NOTE — ED Notes (Signed)
Patient transported to X-ray 

## 2013-09-23 NOTE — ED Notes (Signed)
Pt refused iv protonix at this time. She states she does not want to take any more medication

## 2013-09-23 NOTE — ED Notes (Signed)
Discussion with patient regarding protonix medication, pt now requesting to take protonix but only if doctor really wants patient to have it. Informed MD belfi of patients request to speak with her prior to taking the medication.

## 2013-09-23 NOTE — ED Provider Notes (Signed)
CSN: [id number]     Arrival date & time 09/23/13  1902 History  This chart was scribed for Rolan Bucco, MD by Beverly Milch, ED Scribe. This patient was seen in room MH05/MH05 and the patient's care was started at 7:41 PM.    Chief Complaint  Patient presents with  . Chest Pain     The history is provided by the patient and a relative. No language interpreter was used.   HPI Comments: Barbara Mcconnell is a 79 y.o. female with a h/o HTN, HLD, and CAD who presents to the Emergency Department complaining of intermittent "tight" central chest pain that began as early as February 9th with associated "dull" abdominal pain, aching right shoulder pain beneath the shoulder blade, chills, and SOB. Pt states that belching relieves the her chest pain. She states when she had difficulty belching today she drank ginger ale and took maalox with no relief. She reports she had abdominal pain earlier in the day was minimally relieved with the maalox. Pt denies nausea, vomiting, hematochezia, melena, dysuria, fever, and lower extremity edema. Pt denies h/o abdominal surgery. Pt is concerned her symptoms are related to doubling her Crestor dose.  She also took a course of abx in early Feb prior to symptoms starting.  She states over the last month, her stomach has been upset with increased belching and at times producing tightness to center of chest.  She does have improvement with maalox, but states she is unable to take any other antacid type medications.   She denies exertional symptoms.  No SOB, diaphoresis.      Past Medical History  Diagnosis Date  . Hypertension   . Hyperlipemia   . Coronary artery disease    Past Surgical History  Procedure Laterality Date  . Tonsillectomy     No family history on file. History  Substance Use Topics  . Smoking status: Never Smoker   . Smokeless tobacco: Not on file  . Alcohol Use: No   OB History   Grav Para Term Preterm Abortions TAB SAB Ect Mult Living                  Review of Systems  Constitutional: Positive for chills. Negative for fever, diaphoresis and fatigue.  HENT: Negative for congestion, rhinorrhea and sneezing.   Eyes: Negative.   Respiratory: Positive for chest tightness. Negative for cough and shortness of breath.   Cardiovascular: Negative for chest pain and leg swelling.  Gastrointestinal: Positive for abdominal pain. Negative for nausea, vomiting, diarrhea and blood in stool.  Genitourinary: Negative for frequency, hematuria, flank pain and difficulty urinating.  Musculoskeletal: Negative for arthralgias and back pain.  Skin: Negative for rash.  Neurological: Negative for dizziness, speech difficulty, weakness, numbness and headaches.      Allergies  Review of patient's allergies indicates no known allergies.  Home Medications   Current Outpatient Rx  Name  Route  Sig  Dispense  Refill  . fexofenadine (ALLEGRA) 180 MG tablet   Oral   Take 180 mg by mouth daily.           Marland Kitchen ibuprofen (ADVIL,MOTRIN) 200 MG tablet   Oral   Take 200 mg by mouth every 6 (six) hours as needed. For pain          . lisinopril-hydrochlorothiazide (PRINZIDE,ZESTORETIC) 20-25 MG per tablet   Oral   Take 1 tablet by mouth daily.           . metoprolol succinate (TOPROL-XL)  25 MG 24 hr tablet   Oral   Take 25 mg by mouth daily.           . Multiple Vitamin (MULTIVITAMIN) tablet   Oral   Take 1 tablet by mouth daily.           . Omega-3 Fatty Acids (FISH OIL) 1200 MG CAPS   Oral   Take 1 capsule by mouth daily.           . rosuvastatin (CRESTOR) 10 MG tablet   Oral   Take 10 mg by mouth daily.           . Tetrahydrozoline HCl (VISINE OP)   Ophthalmic   Apply 1 drop to eye daily.            Triage Vitals: BP 173/88  Pulse 81  Temp(Src) 98 F (36.7 C) (Oral)  Resp 18  Ht 5\' 2"  (1.575 m)  Wt 138 lb (62.596 kg)  BMI 25.23 kg/m2  SpO2 99%  Physical Exam  Constitutional: She is oriented to person,  place, and time. She appears well-developed and well-nourished.  HENT:  Head: Normocephalic and atraumatic.  Eyes: Pupils are equal, round, and reactive to light.  Neck: Normal range of motion. Neck supple.  Cardiovascular: Normal rate, regular rhythm and normal heart sounds.   Pulmonary/Chest: Effort normal and breath sounds normal. No respiratory distress. She has no wheezes. She has no rales. She exhibits no tenderness.  Abdominal: Soft. Bowel sounds are normal. There is tenderness (Positive tenderness to the left lower quadrant and across the lower abdomen). There is no rebound and no guarding.  Musculoskeletal: Normal range of motion. She exhibits no edema.  Lymphadenopathy:    She has no cervical adenopathy.  Neurological: She is alert and oriented to person, place, and time.  Skin: Skin is warm and dry. No rash noted.  Psychiatric: She has a normal mood and affect.    ED Course  Procedures (including critical care time)  DIAGNOSTIC STUDIES: Oxygen Saturation is 99% on RA, normal by my interpretation.    COORDINATION OF CARE: 7:56 PM- Will order CT scan of abdomen. Pt advised of plan for treatment and pt agrees.    Labs Review Results for orders placed during the hospital encounter of 09/23/13  CBC WITH DIFFERENTIAL      Result Value Ref Range   WBC 6.9  4.0 - 10.5 K/uL   RBC 4.37  3.87 - 5.11 MIL/uL   Hemoglobin 13.0  12.0 - 15.0 g/dL   HCT 16.137.9  09.636.0 - 04.546.0 %   MCV 86.7  78.0 - 100.0 fL   MCH 29.7  26.0 - 34.0 pg   MCHC 34.3  30.0 - 36.0 g/dL   RDW 40.912.8  81.111.5 - 91.415.5 %   Platelets 232  150 - 400 K/uL   Neutrophils Relative % 50  43 - 77 %   Neutro Abs 3.4  1.7 - 7.7 K/uL   Lymphocytes Relative 42  12 - 46 %   Lymphs Abs 2.9  0.7 - 4.0 K/uL   Monocytes Relative 7  3 - 12 %   Monocytes Absolute 0.5  0.1 - 1.0 K/uL   Eosinophils Relative 1  0 - 5 %   Eosinophils Absolute 0.1  0.0 - 0.7 K/uL   Basophils Relative 0  0 - 1 %   Basophils Absolute 0.0  0.0 - 0.1 K/uL   COMPREHENSIVE METABOLIC PANEL      Result Value Ref  Range   Sodium 135 (*) 137 - 147 mEq/L   Potassium 4.0  3.7 - 5.3 mEq/L   Chloride 95 (*) 96 - 112 mEq/L   CO2 27  19 - 32 mEq/L   Glucose, Bld 104 (*) 70 - 99 mg/dL   BUN 21  6 - 23 mg/dL   Creatinine, Ser 1.61  0.50 - 1.10 mg/dL   Calcium 9.9  8.4 - 09.6 mg/dL   Total Protein 7.5  6.0 - 8.3 g/dL   Albumin 4.1  3.5 - 5.2 g/dL   AST 22  0 - 37 U/L   ALT 19  0 - 35 U/L   Alkaline Phosphatase 55  39 - 117 U/L   Total Bilirubin 0.7  0.3 - 1.2 mg/dL   GFR calc non Af Amer 48 (*) >90 mL/min   GFR calc Af Amer 55 (*) >90 mL/min  TROPONIN I      Result Value Ref Range   Troponin I <0.30  <0.30 ng/mL  LIPASE, BLOOD      Result Value Ref Range   Lipase 39  11 - 59 U/L  TROPONIN I      Result Value Ref Range   Troponin I <0.30  <0.30 ng/mL   Dg Chest 2 View  09/23/2013   CLINICAL DATA:  Chest pain  EXAM: CHEST  2 VIEW  COMPARISON:  DG CHEST 1V PORT dated 05/11/2013; DG CHEST 2 VIEW dated 09/15/2008  FINDINGS: Cardiac silhouette is mildly enlarged. Lungs are hyperinflated. No effusion, infiltrate, or pneumothorax. There is a nodular density projecting over the left lung base not clearly seen on prior exams.  IMPRESSION: 1. No acute cardiopulmonary findings. 2. Hyperinflated lungs suggest emphysema. 3. Potential left lower lobe nodule. Recommend nonemergent CT thorax without contrast for further evaluation.   Electronically Signed   By: Genevive Bi M.D.   On: 09/23/2013 19:52   Ct Abdomen Pelvis W Contrast  09/23/2013   CLINICAL DATA:  Left lower quadrant pain.  EXAM: CT ABDOMEN AND PELVIS WITH CONTRAST  TECHNIQUE: Multidetector CT imaging of the abdomen and pelvis was performed using the standard protocol following bolus administration of intravenous contrast.  CONTRAST:  50mL OMNIPAQUE IOHEXOL 300 MG/ML SOLN, 80mL OMNIPAQUE IOHEXOL 300 MG/ML SOLN  COMPARISON:  11/13/2008  FINDINGS: Lung bases demonstrate minimal scarring anteriorly.   Abdominal images demonstrate a 7 mm hypodensity over the right lobe of the liver likely a small cyst or hemangioma. The spleen, pancreas, gallbladder and adrenal glands are within normal. Kidneys are normal in size without hydronephrosis or nephrolithiasis. There is moderate calcified plaque over the abdominal aorta. Appendix is within normal. There is no free fluid or focal inflammatory change. No evidence of bowel obstruction.  Pelvic images demonstrate mild bladder distention. The uterus in ovaries are within normal. There is no free fluid. There are mild degenerative changes of the spine and hips. There is a grade 1 anterolisthesis of L4 on L5 slightly worse.  IMPRESSION: No acute findings in the abdomen/pelvis.  Sub cm hypodensity over the right lobe of the liver likely a cyst or hemangioma.  Mild grade 1 anterolisthesis of L4 on L5.   Electronically Signed   By: Elberta Fortis M.D.   On: 09/23/2013 21:59     Imaging Review Dg Chest 2 View  09/23/2013   CLINICAL DATA:  Chest pain  EXAM: CHEST  2 VIEW  COMPARISON:  DG CHEST 1V PORT dated 05/11/2013; DG CHEST 2 VIEW dated 09/15/2008  FINDINGS:  Cardiac silhouette is mildly enlarged. Lungs are hyperinflated. No effusion, infiltrate, or pneumothorax. There is a nodular density projecting over the left lung base not clearly seen on prior exams.  IMPRESSION: 1. No acute cardiopulmonary findings. 2. Hyperinflated lungs suggest emphysema. 3. Potential left lower lobe nodule. Recommend nonemergent CT thorax without contrast for further evaluation.   Electronically Signed   By: Genevive Bi M.D.   On: 09/23/2013 19:52   Ct Abdomen Pelvis W Contrast  09/23/2013   CLINICAL DATA:  Left lower quadrant pain.  EXAM: CT ABDOMEN AND PELVIS WITH CONTRAST  TECHNIQUE: Multidetector CT imaging of the abdomen and pelvis was performed using the standard protocol following bolus administration of intravenous contrast.  CONTRAST:  50mL OMNIPAQUE IOHEXOL 300 MG/ML SOLN, 80mL  OMNIPAQUE IOHEXOL 300 MG/ML SOLN  COMPARISON:  11/13/2008  FINDINGS: Lung bases demonstrate minimal scarring anteriorly.  Abdominal images demonstrate a 7 mm hypodensity over the right lobe of the liver likely a small cyst or hemangioma. The spleen, pancreas, gallbladder and adrenal glands are within normal. Kidneys are normal in size without hydronephrosis or nephrolithiasis. There is moderate calcified plaque over the abdominal aorta. Appendix is within normal. There is no free fluid or focal inflammatory change. No evidence of bowel obstruction.  Pelvic images demonstrate mild bladder distention. The uterus in ovaries are within normal. There is no free fluid. There are mild degenerative changes of the spine and hips. There is a grade 1 anterolisthesis of L4 on L5 slightly worse.  IMPRESSION: No acute findings in the abdomen/pelvis.  Sub cm hypodensity over the right lobe of the liver likely a cyst or hemangioma.  Mild grade 1 anterolisthesis of L4 on L5.   Electronically Signed   By: Elberta Fortis M.D.   On: 09/23/2013 21:59     EKG Interpretation   Date/Time:  Monday September 23 2013 19:10:30 EST Ventricular Rate:  80 PR Interval:  150 QRS Duration: 94 QT Interval:  388 QTC Calculation: 447 R Axis:   56 Text Interpretation:  Normal sinus rhythm Possible Left atrial enlargement  Nonspecific T wave abnormality Abnormal ECG slight ST depression V5/V6 as  compared to prior EKG Confirmed by Joslynn Jamroz  MD, Trinton Prewitt ([id number]) on 09/23/2013  7:22:52 PM      MDM   Final diagnoses:  Abdominal pain  Lung nodule    Patient presents with intermittent abdominal discomfort for the last several weeks. This is at times associated with increased gas and tightness to her chest. She has no other symptoms that would be more consistent with acute coronary syndrome. She's had 2 negative troponins. She has been so ST depression V5 V6 but no other EKG changes as compared to her prior EKG. Her symptoms sound more  abdominal in nature versus cardiac. Her CT scan was unremarkable. She will not take any antacid take medicines other than Maalox so encouraged her to use this at home and to have close followup with her primary care physician who is with [facility]. I did advise her of the lung nodule that'll need outpatient followup.  I personally performed the services described in this documentation, which was scribed in my presence.  The recorded information has been reviewed and considered.     Rolan Bucco, MD 09/23/13 2350

## 2013-09-23 NOTE — ED Notes (Signed)
Chest pain for a week. Belching with relief. Chills. Right shoulder pain.

## 2013-09-23 NOTE — ED Notes (Signed)
Patient transported to CT 

## 2013-09-24 ENCOUNTER — Other Ambulatory Visit (HOSPITAL_BASED_OUTPATIENT_CLINIC_OR_DEPARTMENT_OTHER): Payer: Self-pay | Admitting: Emergency Medicine

## 2013-09-24 ENCOUNTER — Ambulatory Visit (HOSPITAL_BASED_OUTPATIENT_CLINIC_OR_DEPARTMENT_OTHER)
Admission: RE | Admit: 2013-09-24 | Discharge: 2013-09-24 | Disposition: A | Discharge status: Home / Self Care | Payer: Medicare HMO | Source: Ambulatory Visit | Attending: Emergency Medicine | Admitting: Emergency Medicine

## 2013-09-24 DIAGNOSIS — M25519 Pain in unspecified shoulder: Secondary | ICD-10-CM | POA: Insufficient documentation

## 2013-09-24 DIAGNOSIS — R079 Chest pain, unspecified: Secondary | ICD-10-CM | POA: Insufficient documentation

## 2013-09-24 DIAGNOSIS — R109 Unspecified abdominal pain: Secondary | ICD-10-CM

## 2013-09-24 NOTE — ED Notes (Signed)
tct to patient to review results of u/s done today, pt informed that u/s showed no acute findings in the abdomen to account for the patient's symptoms. Pt stated she has an appt Monday with pcp to discuss additional poc.

## 2013-11-19 ENCOUNTER — Other Ambulatory Visit: Payer: Self-pay

## 2013-11-19 DIAGNOSIS — Z1231 Encounter for screening mammogram for malignant neoplasm of breast: Secondary | ICD-10-CM

## 2013-12-03 ENCOUNTER — Ambulatory Visit
Admission: RE | Admit: 2013-12-03 | Discharge: 2013-12-03 | Disposition: A | Discharge status: Home / Self Care | Payer: Medicare HMO | Source: Ambulatory Visit

## 2013-12-03 DIAGNOSIS — Z1231 Encounter for screening mammogram for malignant neoplasm of breast: Secondary | ICD-10-CM

## 2014-12-09 ENCOUNTER — Other Ambulatory Visit: Payer: Self-pay

## 2014-12-09 DIAGNOSIS — Z1231 Encounter for screening mammogram for malignant neoplasm of breast: Secondary | ICD-10-CM

## 2015-01-06 ENCOUNTER — Ambulatory Visit
Admission: RE | Admit: 2015-01-06 | Discharge: 2015-01-06 | Disposition: A | Discharge status: Home / Self Care | Payer: Medicare HMO | Source: Ambulatory Visit

## 2015-01-06 DIAGNOSIS — Z1231 Encounter for screening mammogram for malignant neoplasm of breast: Secondary | ICD-10-CM

## 2015-01-07 ENCOUNTER — Other Ambulatory Visit: Payer: Self-pay | Admitting: Internal Medicine

## 2015-01-07 DIAGNOSIS — R928 Other abnormal and inconclusive findings on diagnostic imaging of breast: Secondary | ICD-10-CM

## 2015-01-13 ENCOUNTER — Ambulatory Visit
Admission: RE | Admit: 2015-01-13 | Discharge: 2015-01-13 | Disposition: A | Discharge status: Home / Self Care | Payer: Medicare HMO | Source: Ambulatory Visit | Attending: Internal Medicine | Admitting: Internal Medicine

## 2015-01-13 DIAGNOSIS — R928 Other abnormal and inconclusive findings on diagnostic imaging of breast: Secondary | ICD-10-CM

## 2015-06-09 ENCOUNTER — Other Ambulatory Visit: Payer: Self-pay | Admitting: Internal Medicine

## 2015-06-09 DIAGNOSIS — N6320 Unspecified lump in the left breast, unspecified quadrant: Secondary | ICD-10-CM

## 2015-07-14 ENCOUNTER — Other Ambulatory Visit: Payer: Medicare HMO

## 2015-07-14 ENCOUNTER — Ambulatory Visit
Admission: RE | Admit: 2015-07-14 | Discharge: 2015-07-14 | Disposition: A | Discharge status: Home / Self Care | Payer: Medicare HMO | Source: Ambulatory Visit | Attending: Internal Medicine | Admitting: Internal Medicine

## 2015-07-14 DIAGNOSIS — N6320 Unspecified lump in the left breast, unspecified quadrant: Secondary | ICD-10-CM

## 2015-08-04 ENCOUNTER — Other Ambulatory Visit: Payer: Medicare HMO

## 2016-06-09 ENCOUNTER — Emergency Department (HOSPITAL_BASED_OUTPATIENT_CLINIC_OR_DEPARTMENT_OTHER)
Admission: EM | Admit: 2016-06-09 | Discharge: 2016-06-09 | Disposition: A | Discharge status: Home / Self Care | Payer: Medicare HMO | Attending: Emergency Medicine | Admitting: Emergency Medicine

## 2016-06-09 ENCOUNTER — Encounter (HOSPITAL_BASED_OUTPATIENT_CLINIC_OR_DEPARTMENT_OTHER): Payer: Self-pay | Admitting: *Deleted

## 2016-06-09 DIAGNOSIS — E871 Hypo-osmolality and hyponatremia: Secondary | ICD-10-CM | POA: Diagnosis not present

## 2016-06-09 DIAGNOSIS — Z79899 Other long term (current) drug therapy: Secondary | ICD-10-CM | POA: Insufficient documentation

## 2016-06-09 DIAGNOSIS — R799 Abnormal finding of blood chemistry, unspecified: Secondary | ICD-10-CM | POA: Diagnosis present

## 2016-06-09 DIAGNOSIS — I10 Essential (primary) hypertension: Secondary | ICD-10-CM | POA: Insufficient documentation

## 2016-06-09 DIAGNOSIS — I2510 Atherosclerotic heart disease of native coronary artery without angina pectoris: Secondary | ICD-10-CM | POA: Insufficient documentation

## 2016-06-09 DIAGNOSIS — Z791 Long term (current) use of non-steroidal anti-inflammatories (NSAID): Secondary | ICD-10-CM | POA: Diagnosis not present

## 2016-06-09 LAB — BASIC METABOLIC PANEL
ANION GAP: 5 (ref 5–15)
BUN: 15 mg/dL (ref 6–20)
CHLORIDE: 95 mmol/L — AB (ref 101–111)
CO2: 27 mmol/L (ref 22–32)
Calcium: 8.9 mg/dL (ref 8.9–10.3)
Creatinine, Ser: 0.85 mg/dL (ref 0.44–1.00)
GFR calc non Af Amer: >60 mL/min (ref >60)
Glucose, Bld: 104 mg/dL — ABNORMAL HIGH (ref 65–99)
POTASSIUM: 3.9 mmol/L (ref 3.5–5.1)
SODIUM: 127 mmol/L — AB (ref 135–145)

## 2016-06-09 MED ORDER — SODIUM CHLORIDE 0.9 % IV BOLUS (SEPSIS)
500.0000 mL | Freq: Once | INTRAVENOUS | Status: AC
  Administered 2016-06-09: 500 mL via INTRAVENOUS

## 2016-06-09 MED ORDER — SODIUM CHLORIDE 0.9 % IV SOLN: INTRAVENOUS | Status: DC

## 2016-06-09 NOTE — ED Provider Notes (Signed)
[facility] Provider Note   CSN: [id number] Arrival date & time: 06/09/16  1621     History   Chief Complaint Chief Complaint  Patient presents with  . Abnormal Lab    HPI Barbara Mcconnell is a 81 y.o. female.  The patient referred in from [facility]. Patient had lab work done today that critical results in her sodium of 123. Patient's completely asymptomatic. Review of patient's labs show that her sodium over the past several months as been around 127. Renal function was normal. Patient's been consuming large amounts of water she believes at the request of her regular doctor to help her kidney function. A year ago patient did have slight elevation in creatinine. But as been normal for the past several months.      Past Medical History:  Diagnosis Date  . Coronary artery disease   . Hyperlipemia   . Hypertension     There are no active problems to display for this patient.   Past Surgical History:  Procedure Laterality Date  . TONSILLECTOMY      OB History    No data available       Home Medications    Prior to Admission medications   Medication Sig Start Date End Date Taking? Authorizing Provider  fexofenadine (ALLEGRA) 180 MG tablet Take 180 mg by mouth daily.     Yes Historical Provider, MD  ibuprofen (ADVIL,MOTRIN) 200 MG tablet Take 200 mg by mouth every 6 (six) hours as needed. For pain    Yes Historical Provider, MD  lisinopril-hydrochlorothiazide (PRINZIDE,ZESTORETIC) 20-25 MG per tablet Take 1 tablet by mouth daily.     Yes Historical Provider, MD  metoprolol succinate (TOPROL-XL) 25 MG 24 hr tablet Take 25 mg by mouth daily.     Yes Historical Provider, MD  Multiple Vitamin (MULTIVITAMIN) tablet Take 1 tablet by mouth daily.     Yes Historical Provider, MD  Omega-3 Fatty Acids (FISH OIL) 1200 MG CAPS Take 1 capsule by mouth daily.     Yes Historical Provider, MD  rosuvastatin (CRESTOR) 10 MG tablet Take 10 mg by mouth  daily.      Historical Provider, MD  Tetrahydrozoline HCl (VISINE OP) Apply 1 drop to eye daily.      Historical Provider, MD    Family History No family history on file.  Social History Social History  Substance Use Topics  . Smoking status: Never Smoker  . Smokeless tobacco: Never Used  . Alcohol use No     Allergies   Patient has no known allergies.   Review of Systems Review of Systems  Constitutional: Negative for fever.  HENT: Negative for congestion.   Eyes: Negative for redness and visual disturbance.  Respiratory: Negative for chest tightness and shortness of breath.   Cardiovascular: Negative for chest pain and leg swelling.  Gastrointestinal: Negative for abdominal pain, diarrhea, nausea and vomiting.  Genitourinary: Negative for dysuria and hematuria.  Skin: Negative for rash.  Neurological: Negative for seizures, speech difficulty, weakness, numbness and headaches.  Hematological: Does not bruise/bleed easily.  Psychiatric/Behavioral: Negative for confusion.     Physical Exam Updated Vital Signs BP 165/65 (BP Location: Left Arm)   Pulse 68   Temp 97.9 F (36.6 C) (Oral)   Resp 20   Ht 5\' 1"  (1.549 m)   Wt 59 kg   SpO2 100%   BMI 24.56 kg/m   Physical Exam  Constitutional: She is oriented to person, place, and time. She  appears well-developed and well-nourished. No distress.  HENT:  Head: Normocephalic and atraumatic.  Mouth/Throat: Oropharynx is clear and moist.  Eyes: Conjunctivae and EOM are normal. Pupils are equal, round, and reactive to light.  Neck: Normal range of motion.  Cardiovascular: Normal rate and regular rhythm.   Pulmonary/Chest: Effort normal and breath sounds normal. No respiratory distress.  Abdominal: Soft. Bowel sounds are normal. There is no tenderness.  Musculoskeletal: Normal range of motion. She exhibits no edema.  Neurological: She is alert and oriented to person, place, and time. No cranial nerve deficit or sensory  deficit. She exhibits normal muscle tone. Coordination normal.  Skin: Skin is warm.  Nursing note and vitals reviewed.    ED Treatments / Results  Labs (all labs ordered are listed, but only abnormal results are displayed) Labs Reviewed  BASIC METABOLIC PANEL - Abnormal; Notable for the following:       Result Value   Sodium 127 (*)    Chloride 95 (*)    Glucose, Bld 104 (*)    All other components within normal limits   Results for orders placed or performed during the hospital encounter of 06/09/16  Basic metabolic panel  Result Value Ref Range   Sodium 127 (L) 135 - 145 mmol/L   Potassium 3.9 3.5 - 5.1 mmol/L   Chloride 95 (L) 101 - 111 mmol/L   CO2 27 22 - 32 mmol/L   Glucose, Bld 104 (H) 65 - 99 mg/dL   BUN 15 6 - 20 mg/dL   Creatinine, Ser 1.61 0.44 - 1.00 mg/dL   Calcium 8.9 8.9 - 09.6 mg/dL   GFR calc non Af Amer >60 >60 mL/min   GFR calc Af Amer >60 >60 mL/min   Anion gap 5 5 - 15    EKG  EKG Interpretation None       Radiology No results found.  Procedures Procedures (including critical care time)  Medications Ordered in ED Medications  0.9 %  sodium chloride infusion (not administered)  sodium chloride 0.9 % bolus 500 mL (0 mLs Intravenous Stopped 06/09/16 1855)  sodium chloride 0.9 % bolus 500 mL (0 mLs Intravenous Stopped 06/09/16 1924)     Initial Impression / Assessment and Plan / ED Course  I have reviewed the triage vital signs and the nursing notes.  Pertinent labs & imaging results that were available during my care of the patient were reviewed by me and considered in my medical decision making (see chart for details).  Clinical Course     The patient referred in from [facility]. Patient had lab work done today. They had a critical sodium of 123. Patient completely asymptomatic. Renal function was normal. No other significant abnormalities.  Patient here was given 1 L of normal saline which she tolerated fine.  Recheck of her sodium was back up to 127 still not normal. Rest of electrolytes were okay on the chloride being slightly down.  Patient will need a recheck of sodium next week by her primary care doctor.  Patient's low sodium most likely due to a significant increase in water intake. Patient states that that was at the encouragement of her doctor. Patient's previous sodiums were [facility] of been around 127 for the past several several months. Patient will decrease her water intake. We'll see if this helps correct the sodium.  Final Clinical Impressions(s) / ED Diagnoses   Final diagnoses:  Hyponatremia    New Prescriptions New Prescriptions   No medications on file  Vanetta MuldersScott Lashandra Arauz, MD 06/09/16 [phone]

## 2016-06-09 NOTE — ED Notes (Signed)
Pt alert, NAD, calm, interactive, resps e/u, speaking in clear complete sentences, steady gait back from b/r to room. No dyspnea noted.

## 2016-06-09 NOTE — ED Triage Notes (Signed)
She had lab work at her doctors office and was called at home and told her sodium is low.

## 2016-06-09 NOTE — Discharge Instructions (Signed)
Patient referred in from [facility] for hyponatremia. Patient's sodium was 123. Patient without any symptoms. Patient has been consuming a lot of water at her doctor's advice. I believe to try to help her kidney function. But not clear. Patient's sodium show signs of improvement after 1 L of normal saline. Patient will cut down on her water intake she'll follow back up with her primary care doctor for recheck. Return for any new or worse symptoms.

## 2016-06-09 NOTE — ED Notes (Signed)
ED Provider at bedside. 

## 2016-06-09 NOTE — ED Notes (Signed)
Pt states she was seen by here PCP earlier today and was told that her sodium was low. She was told to "drink a lot of water" and she has been doing that. Was also done a week ago and it was "even lower than today". Has been urinating a lot and been on a lot of antibiotics. Pt is sitting up in chair in room. Responds approp to questions and follows commands. A&O.

## 2016-07-21 ENCOUNTER — Ambulatory Visit (HOSPITAL_BASED_OUTPATIENT_CLINIC_OR_DEPARTMENT_OTHER)
Admission: RE | Admit: 2016-07-21 | Discharge: 2016-07-21 | Disposition: A | Discharge status: Home / Self Care | Payer: Medicare HMO | Source: Ambulatory Visit | Attending: Nurse Practitioner | Admitting: Nurse Practitioner

## 2016-07-21 ENCOUNTER — Other Ambulatory Visit (HOSPITAL_BASED_OUTPATIENT_CLINIC_OR_DEPARTMENT_OTHER): Payer: Self-pay | Admitting: Nurse Practitioner

## 2016-07-21 DIAGNOSIS — M25511 Pain in right shoulder: Secondary | ICD-10-CM

## 2016-09-01 ENCOUNTER — Other Ambulatory Visit: Payer: Self-pay | Admitting: Internal Medicine

## 2016-09-01 DIAGNOSIS — N6320 Unspecified lump in the left breast, unspecified quadrant: Secondary | ICD-10-CM

## 2016-10-04 ENCOUNTER — Other Ambulatory Visit: Payer: Medicare HMO

## 2017-01-03 ENCOUNTER — Ambulatory Visit: Payer: Medicare HMO | Attending: General Practice | Admitting: Physical Therapy

## 2017-01-03 DIAGNOSIS — M6281 Muscle weakness (generalized): Secondary | ICD-10-CM | POA: Diagnosis present

## 2017-01-03 DIAGNOSIS — M25511 Pain in right shoulder: Secondary | ICD-10-CM

## 2017-01-03 DIAGNOSIS — R293 Abnormal posture: Secondary | ICD-10-CM

## 2017-01-03 NOTE — Patient Instructions (Signed)
NECK TENSION: Assisted Stretch   Reach right arm around head and hold slightly above ear. Gently bring right ear toward right shoulder. Hold position for _30__ breaths. Repeat with other arm. Repeat _3__ times, alternating arms. Do __2-3_ times per day.   Scapular Retraction (Standing)   With arms at sides, pinch shoulder blades together. Repeat _15___ times per set. Do __2__ sets per session. Do __2__ sessions per day.   Resistive Band Rowing   With resistive band anchored in door, grasp both ends. Keeping elbows bent, pull back, squeezing shoulder blades together. Hold __5__ seconds. Repeat __15__ times. Do __2__ sessions per day.   INTERNAL ROTATION: Standing - Stable: Exercise Band (Active)   Stand, right arm bent to 90, elbow against side, forearm out from body. Against yellow resistance band, rotate arm in to body, keeping elbow at side. Complete __2_ sets of _15__ repetitions.   EXTERNAL ROTATION: Standing - Stable: Exercise Band (Active)   Stand, right arm bent to 90, elbow against side, hand forward. Against yellow resistance band, rotate forearm outward, keeping elbow at side. Rotate forearm outward as far as possible. Complete _2__ sets of _15__ repetitions.

## 2017-01-03 NOTE — Therapy (Signed)
[facility] [address]  [address] [place], [place], [address] Phone: [phone]   Fax:  [phone]  Physical Therapy Evaluation  Patient Details  Name: Barbara Mcconnell MRN: [id number] Date of Birth: 12/10/1934 Referring Provider: Dr. Dagoberto Reef  Encounter Date: 01/03/2017      PT End of Session - 01/03/17 1600    Visit Number 1   Number of Visits 8   Date for PT Re-Evaluation 02/28/17   Authorization Type Medicare   PT Start Time 1445   PT Stop Time 1538   PT Time Calculation (min) 53 min   Activity Tolerance Patient tolerated treatment well   Behavior During Therapy [facility] for tasks assessed/performed      Past Medical History:  Diagnosis Date  . Coronary artery disease   . Hyperlipemia   . Hypertension     Past Surgical History:  Procedure Laterality Date  . TONSILLECTOMY      There were no vitals filed for this visit.       Subjective Assessment - 01/03/17 1549    Subjective Patient reporting R shoulder pain for approx past 6 months. Patient originally seen by PCP in December with x-ray completed and no findings. Then seen by ortho MD in May 2018 - with dx of supraspinatus tear. Patient reporting [place] and lives a very active lifestyle. Patient today very concerned regarding new "bump" on anterior R shoulder, of which she reports MD states is infammation   Pertinent History CAD, HTN   Diagnostic tests Xray R shoulder: negative   Patient Stated Goals improve pain and function   Currently in Pain? Yes   Pain Score 3    Pain Location Shoulder   Pain Orientation Right   Pain Descriptors / Indicators Aching;Sore   Pain Type Acute pain   Pain Radiating Towards intermittent pain radiation into R bicep/deltoid   Pain Onset More than a month ago   Pain Frequency Intermittent   Aggravating Factors  overhead/lateral movements   Pain Relieving Factors Salonpas             [facility] PT Assessment - 01/03/17 0001      Assessment   Medical Diagnosis Nontraumatic tear of R supraspinatus tendon   Referring Provider Dr. Dagoberto Reef   Onset Date/Surgical Date --  approx 6 months ago   Next MD Visit prn   Prior Therapy no     Precautions   Precautions None     Restrictions   Weight Bearing Restrictions No     Balance Screen   Has the patient fallen in the past 6 months No   Has the patient had a decrease in activity level because of a fear of falling?  No   Is the patient reluctant to leave their home because of a fear of falling?  No     Home [employer] information [employer] manager residence     Prior Function   Level of Independence Independent   Vocation Retired   Leisure exercise class     Cognition   Overall Cognitive Status Within Functional Limits for tasks assessed     Observation/Other Assessments   Focus on Therapeutic Outcomes (FOTO)  Shoulder: 38 (62% limited, predicted 39% limited)     Sensation   Light Touch Appears Intact     Coordination   Gross Motor Movements are Fluid and Coordinated Yes     Posture/Postural Control   Posture/Postural  Control Postural limitations   Postural Limitations Rounded Shoulders;Forward head;Increased thoracic kyphosis     ROM / Strength   AROM / PROM / Strength AROM;Strength     AROM   AROM Assessment Site Shoulder   Right/Left Shoulder Right   Right Shoulder Flexion 155 Degrees   Right Shoulder ABduction 156 Degrees   Right Shoulder Internal Rotation --  FIR to approx L1 - slight pain at end range   Right Shoulder External Rotation --  FER to approx TI - slight pain at end range     Strength   Strength Assessment Site Shoulder   Right/Left Shoulder Right   Right Shoulder Flexion 4-/5   Right Shoulder ABduction 3+/5   Right Shoulder Internal Rotation 3+/5   Right Shoulder External Rotation 3+/5     Palpation   Palpation comment slightly moveable nodule at R anterior shoulder -  tender to deep pressure     Special Tests    Special Tests Rotator Cuff Impingement   Rotator Cuff Impingment tests Leanord Asal test;Empty Can test     Hawkins-Kennedy test   Findings Positive   Side Right     Empty Can test   Side Right   Comment painful and weak, able to slightly resist            Objective measurements completed on examination: See above findings.          [facility] Adult PT Treatment/Exercise - 01/03/17 0001      Exercises   Exercises Shoulder     Shoulder Exercises: Seated   Retraction Strengthening;Both;10 reps   Retraction Limitations 5 sec hold     Shoulder Exercises: Standing   External Rotation Strengthening;Right;10 reps;Theraband   Theraband Level (Shoulder External Rotation) Level 2 (Red)   Internal Rotation Strengthening;Right;10 reps;Theraband   Theraband Level (Shoulder Internal Rotation) Level 2 (Red)   Row Strengthening;Both;10 reps;Theraband   Theraband Level (Shoulder Row) Level 2 (Red)     Shoulder Exercises: Stretch   Other Shoulder Stretches R UT stretch - 3 x 30 seconds                PT Education - 01/03/17 1600    Education provided Yes   Education Details exam findings, POC, HEP, advice to follow-up with MD regarding "bump" at R shoulder   Person(s) Educated Patient   Methods Explanation;Demonstration;Handout   Comprehension Verbalized understanding;Returned demonstration;Need further instruction          PT Short Term Goals - 01/03/17 1733      PT SHORT TERM GOAL #1   Title patient to be independent with initial HEP (01/31/17)   Status New           PT Long Term Goals - 01/03/17 1733      PT LONG TERM GOAL #1   Title patient to be independent with advanced HEP (02/28/17)   Status New     PT LONG TERM GOAL #2   Title Patient to perform AROM in all planes WNL without pain limiting (02/28/17)   Status New     PT LONG TERM GOAL #3   Title Patient to improve gross R shoulder strength to >/=  4+/5 without pain (02/28/17)   Status New     PT LONG TERM GOAL #4   Title Patient to report ability to particiapte in exercise class and perform ADLs and household tasks without pain limiting (02/28/17)   Status New     PT LONG TERM GOAL #5  Title Patient to demonstrate good postural alignment with ability to self-correct (02/28/17)   Status New                Plan - 01/03/17 1611    Clinical Impression Statement Patient is a 82 y/o female presenting to OPPT today regarding primary complaints of R shoulder pain limiting particiaption in exercise and daily activities. Patient today with near full AROM at R shoulder, however, noted weakness of RTC musculature, especially with resisted IR/ER and empty can testing -  able to resist motion with heave attempt. Initially patient apprehensive to beginning therapy at this time due to noted nodule at R anterior shoulder. PT providing patient with education to follow-up with MD regarding this new occurence, however if patient wishing to begin therapy at this time, therapy to be geared toward restoring full AROM, strength, and functional use of R UE without pain limiting with good carryover. Nodule at R anterior shoulder is slightly moveable as well as tenderness with deep pressure - with no known mechanism of injury. Patient given initial HEP today geared towards postural re-education as well as for some RTC strengtheing with good carryover. Patient to benefit from PT to address the above listed deficits to allow for improved functional use of R UE as well as improved quality of life.    Clinical Presentation Stable   Clinical Decision Making Low   Rehab Potential Good   PT Frequency 1x / week   PT Duration 8 weeks   PT Treatment/Interventions ADLs/Self Care Home Management;Cryotherapy;Electrical Stimulation;Iontophoresis 4mg /ml Dexamethasone;Moist Heat;Ultrasound;Neuromuscular re-education;Therapeutic exercise;Therapeutic activities;Patient/family  education;Manual techniques;Passive range of motion;Vasopneumatic Device;Taping;Dry needling   Consulted and Agree with Plan of Care Patient      Patient will benefit from skilled therapeutic intervention in order to improve the following deficits and impairments:  Decreased activity tolerance, Decreased range of motion, Decreased strength, Pain, Impaired UE functional use  Visit Diagnosis: Acute pain of right shoulder - Plan: PT plan of care cert/re-cert  Abnormal posture - Plan: PT plan of care cert/re-cert  Muscle weakness (generalized) - Plan: PT plan of care cert/re-cert      G-Codes - 01/03/17 1605    Functional Assessment Tool Used (Outpatient Only) FOTO: 38 (62% limited)   Functional Limitation Carrying, moving and handling objects   Carrying, Moving and Handling Objects Current Status ([id number]) At least 60 percent but less than 80 percent impaired, limited or restricted   Carrying, Moving and Handling Objects Goal Status (U0454(G8985) At least 20 percent but less than 40 percent impaired, limited or restricted       Problem List There are no active problems to display for this patient.    Kipp LaurenceStephanie R Brenten Janney, PT, DPT 01/03/17 5:37 PM   [facility] [place] [address]  [address] [place], [place], [address] Phone: [phone]   Fax:  [phone]  Name: Barbara CorkMary Mcconnell MRN: [id number] Date of Birth: 06/11/1935

## 2017-01-18 ENCOUNTER — Ambulatory Visit: Payer: Medicare HMO | Admitting: Physical Therapy

## 2017-01-18 DIAGNOSIS — M25511 Pain in right shoulder: Secondary | ICD-10-CM | POA: Diagnosis not present

## 2017-01-18 DIAGNOSIS — R293 Abnormal posture: Secondary | ICD-10-CM

## 2017-01-18 DIAGNOSIS — M6281 Muscle weakness (generalized): Secondary | ICD-10-CM

## 2017-01-18 NOTE — Patient Instructions (Signed)
External Rotation: Side-Lying (Dumbbell)    Lie with neck supported, left elbow bent to 90, forearm across stomach. Raise forearm, keeping elbow at side. Repeat _15___ times per set. Do ___2_ sets per session.    Abduction - Side-Lying (Dumbbell)    Lie with neck supported, right arm on hip. Lift straight arm toward ceiling. Repeat __15__ times per set. Do __2__ sets per session.    Prone Dumbbell Row    Lie prone over ball, body straight, toes touching floor. Lift __1-2_ lb dumbbells, pulling shoulder blades together. Do __2_ sets of __15_ repetitions.   Extension - Prone (Dumbbell)    Lie with right arm hanging off side of bed. Lift hand back and up. Repeat __15__ times per set. Do __2__ sets per session.   Abduction: Horizontal - Prone (Dumbbell)    Lie with right arm hanging down. Lift arm out to side, thumb up. Repeat __15__ times per set. Do __2__ sets per session.

## 2017-01-18 NOTE — Therapy (Addendum)
[facility] [address]  [place], [place], [address] Phone: [phone]   Fax:  [phone]  Physical Therapy Treatment  Patient Details  Name: Barbara Mcconnell MRN: [id number] Date of Birth: 1934/10/28 Referring Provider: Dr. Idelia Salm  Encounter Date: 01/18/2017      PT End of Session - 01/18/17 1757    Visit Number 2   Number of Visits 8   Date for PT Re-Evaluation 02/28/17   Authorization Type Medicare   PT Start Time 1400   PT Stop Time 1447   PT Time Calculation (min) 47 min   Activity Tolerance Patient tolerated treatment well   Behavior During Therapy [facility] for tasks assessed/performed      Past Medical History:  Diagnosis Date  . Coronary artery disease   . Hyperlipemia   . Hypertension     Past Surgical History:  Procedure Laterality Date  . TONSILLECTOMY      There were no vitals filed for this visit.      Subjective Assessment - 01/18/17 1753    Subjective Patient reporting that she doesn't believe in PT, but her daughter wants her to continue to be seen. Seen by MD last weekw tih aspiration of cyst at R shoulder. Still having some pain.    Pertinent History CAD, HTN   Diagnostic tests Xray R shoulder: negative   Patient Stated Goals improve pain and function   Currently in Pain? Yes   Pain Score 2    Pain Location Shoulder   Pain Orientation Right   Pain Descriptors / Indicators Aching;Sore;Discomfort   Pain Type Acute pain                         [facility] Adult PT Treatment/Exercise - 01/18/17 0001      Shoulder Exercises: Prone   Retraction Right;15 reps;Weights   Retraction Weight (lbs) 1   Retraction Limitations prone rows   Extension Right;15 reps;Weights   Extension Weight (lbs) 1   Extension Limitations prone I's   Horizontal ABduction 1 Right;15 reps;Weights   Horizontal ABduction 1 Weight (lbs) 1   Horizontal ABduction 1 Limitations prone T's     Shoulder Exercises: Sidelying   External Rotation Right;15 reps;Weights   External Rotation Weight (lbs) 2   ABduction Right;15 reps;Weights   ABduction Weight (lbs) 1   ABduction Limitations heavy VC to reduce shoulder hiking     Shoulder Exercises: ROM/Strengthening   UBE (Upper Arm Bike) L1 x 6 minutes (3/3)     Modalities   Modalities Iontophoresis     Iontophoresis   Type of Iontophoresis Dexamethasone   Location R shoulder   Dose 1.0 mL   Time 80 mA; 4-6 hours                  PT Short Term Goals - 01/03/17 1733      PT SHORT TERM GOAL #1   Title patient to be independent with initial HEP (01/31/17)   Status New           PT Long Term Goals - 01/03/17 1733      PT LONG TERM GOAL #1   Title patient to be independent with advanced HEP (02/28/17)   Status New     PT LONG TERM GOAL #2   Title Patient to perform AROM in all planes WNL without pain limiting (02/28/17)   Status New     PT LONG TERM GOAL #  3   Title Patient to improve gross R shoulder strength to >/= 4+/5 without pain (02/28/17)   Status New     PT LONG TERM GOAL #4   Title Patient to report ability to particiapte in exercise class and perform ADLs and household tasks without pain limiting (02/28/17)   Status New     PT LONG TERM GOAL #5   Title Patient to demonstrate good postural alignment with ability to self-correct (02/28/17)   Status New               Plan - 01/18/17 1757    Clinical Impression Statement Patient today reporting some continued pain - seen by MD last week with aspiration of R cyst. Patient has continued to be active with [employer], as well as intermittent complaince with HEP. PT session today focusing on RTC and periscapular musculature with heavy VC required to reduce shoulder hiking and promote normal biomechanics at [facility] joint. Ionto initiated today for hopeful pain releif with education on indications and precautions with good understanding.    PT  Treatment/Interventions ADLs/Self Care Home Management;Cryotherapy;Electrical Stimulation;Iontophoresis 73m/ml Dexamethasone;Moist Heat;Ultrasound;Neuromuscular re-education;Therapeutic exercise;Therapeutic activities;Patient/family education;Manual techniques;Passive range of motion;Vasopneumatic Device;Taping;Dry needling   Consulted and Agree with Plan of Care Patient      Patient will benefit from skilled therapeutic intervention in order to improve the following deficits and impairments:  Decreased activity tolerance, Decreased range of motion, Decreased strength, Pain, Impaired UE functional use  Visit Diagnosis: Acute pain of right shoulder  Abnormal posture  Muscle weakness (generalized)     G-Codes - 006/16/181605    Functional Assessment Tool Used (Outpatient Only) FOTO: 38 (62% limited)   Functional Limitation Carrying, moving and handling objects   Carrying, Moving and Handling Objects Goal Status ((U8280 At least 20 percent but less than 40 percent impaired, limited or restricted   Carrying, Moving and Handling Objects current Status ((K3491 At least 60 percent but less than 80 percent impaired, limited or restricted     Problem List There are no active problems to display for this patient.     SLanney Gins PT, DPT 01/18/17 6:03 PM    PHYSICAL THERAPY DISCHARGE SUMMARY  Visits from Start of Care: 2  Current functional level related to goals / functional outcomes: See abvoe   Remaining deficits: See above; only presenting to OPPT x2 - patient with poor/fair attitude regarding PT   Education / Equipment: HEP  Plan: Patient agrees to discharge.  Patient goals were not met. Patient is being discharged due to not returning since the last visit.  ?????    SLanney Gins PT, DPT 02/20/17 3:48 PM   [facility] [place]herryvilleHG[place] [address]Phone: [phone]  Fax:   [phone] Name: MVeta DambrosiaMRN: [id number] of Birth: 816-Jan-1936

## 2017-01-24 ENCOUNTER — Ambulatory Visit: Payer: Medicare HMO | Admitting: Physical Therapy

## 2017-02-20 DIAGNOSIS — M25511 Pain in right shoulder: Secondary | ICD-10-CM | POA: Diagnosis not present

## 2019-05-13 ENCOUNTER — Inpatient Hospital Stay (HOSPITAL_COMMUNITY): Payer: Medicare HMO

## 2019-05-13 ENCOUNTER — Inpatient Hospital Stay (HOSPITAL_BASED_OUTPATIENT_CLINIC_OR_DEPARTMENT_OTHER)
Admission: EM | Admit: 2019-05-13 | Discharge: 2019-05-15 | DRG: 280 | Disposition: A | Discharge status: Home / Self Care | Payer: Medicare HMO | Attending: Cardiology | Admitting: Cardiology

## 2019-05-13 ENCOUNTER — Emergency Department (HOSPITAL_BASED_OUTPATIENT_CLINIC_OR_DEPARTMENT_OTHER): Payer: Medicare HMO

## 2019-05-13 ENCOUNTER — Other Ambulatory Visit: Payer: Self-pay

## 2019-05-13 ENCOUNTER — Inpatient Hospital Stay (HOSPITAL_BASED_OUTPATIENT_CLINIC_OR_DEPARTMENT_OTHER): Payer: Medicare HMO

## 2019-05-13 ENCOUNTER — Encounter (HOSPITAL_BASED_OUTPATIENT_CLINIC_OR_DEPARTMENT_OTHER): Payer: Self-pay | Admitting: Emergency Medicine

## 2019-05-13 ENCOUNTER — Encounter (HOSPITAL_COMMUNITY)
Admission: EM | Disposition: A | Discharge status: Home / Self Care | Payer: Self-pay | Source: Home / Self Care | Attending: Cardiology

## 2019-05-13 DIAGNOSIS — Z885 Allergy status to narcotic agent status: Secondary | ICD-10-CM | POA: Diagnosis not present

## 2019-05-13 DIAGNOSIS — R42 Dizziness and giddiness: Secondary | ICD-10-CM

## 2019-05-13 DIAGNOSIS — N179 Acute kidney failure, unspecified: Secondary | ICD-10-CM | POA: Diagnosis not present

## 2019-05-13 DIAGNOSIS — I10 Essential (primary) hypertension: Secondary | ICD-10-CM | POA: Diagnosis not present

## 2019-05-13 DIAGNOSIS — I6523 Occlusion and stenosis of bilateral carotid arteries: Secondary | ICD-10-CM

## 2019-05-13 DIAGNOSIS — I350 Nonrheumatic aortic (valve) stenosis: Secondary | ICD-10-CM | POA: Diagnosis present

## 2019-05-13 DIAGNOSIS — E7800 Pure hypercholesterolemia, unspecified: Secondary | ICD-10-CM | POA: Diagnosis present

## 2019-05-13 DIAGNOSIS — Z888 Allergy status to other drugs, medicaments and biological substances status: Secondary | ICD-10-CM

## 2019-05-13 DIAGNOSIS — I5021 Acute systolic (congestive) heart failure: Secondary | ICD-10-CM | POA: Diagnosis not present

## 2019-05-13 DIAGNOSIS — Z809 Family history of malignant neoplasm, unspecified: Secondary | ICD-10-CM

## 2019-05-13 DIAGNOSIS — I361 Nonrheumatic tricuspid (valve) insufficiency: Secondary | ICD-10-CM | POA: Diagnosis not present

## 2019-05-13 DIAGNOSIS — R110 Nausea: Secondary | ICD-10-CM

## 2019-05-13 DIAGNOSIS — I2510 Atherosclerotic heart disease of native coronary artery without angina pectoris: Secondary | ICD-10-CM | POA: Diagnosis not present

## 2019-05-13 DIAGNOSIS — R778 Other specified abnormalities of plasma proteins: Secondary | ICD-10-CM

## 2019-05-13 DIAGNOSIS — I214 Non-ST elevation (NSTEMI) myocardial infarction: Secondary | ICD-10-CM | POA: Diagnosis not present

## 2019-05-13 DIAGNOSIS — I5181 Takotsubo syndrome: Secondary | ICD-10-CM | POA: Diagnosis not present

## 2019-05-13 DIAGNOSIS — Z20828 Contact with and (suspected) exposure to other viral communicable diseases: Secondary | ICD-10-CM | POA: Diagnosis present

## 2019-05-13 DIAGNOSIS — E785 Hyperlipidemia, unspecified: Secondary | ICD-10-CM | POA: Diagnosis not present

## 2019-05-13 DIAGNOSIS — I272 Pulmonary hypertension, unspecified: Secondary | ICD-10-CM | POA: Diagnosis not present

## 2019-05-13 DIAGNOSIS — I110 Hypertensive heart disease with heart failure: Secondary | ICD-10-CM | POA: Diagnosis not present

## 2019-05-13 DIAGNOSIS — I340 Nonrheumatic mitral (valve) insufficiency: Secondary | ICD-10-CM | POA: Diagnosis not present

## 2019-05-13 DIAGNOSIS — I779 Disorder of arteries and arterioles, unspecified: Secondary | ICD-10-CM | POA: Diagnosis present

## 2019-05-13 DIAGNOSIS — R079 Chest pain, unspecified: Secondary | ICD-10-CM

## 2019-05-13 DIAGNOSIS — I509 Heart failure, unspecified: Secondary | ICD-10-CM

## 2019-05-13 HISTORY — PX: RIGHT/LEFT HEART CATH AND CORONARY ANGIOGRAPHY: CATH118266

## 2019-05-13 LAB — DIFFERENTIAL
Abs Immature Granulocytes: 0.01 10*3/uL (ref 0.00–0.07)
Basophils Absolute: 0 10*3/uL (ref 0.0–0.1)
Basophils Relative: 1 %
Eosinophils Absolute: 0.1 10*3/uL (ref 0.0–0.5)
Eosinophils Relative: 2 %
Immature Granulocytes: 0 %
Lymphocytes Relative: 46 %
Lymphs Abs: 3.2 10*3/uL (ref 0.7–4.0)
Monocytes Absolute: 0.4 10*3/uL (ref 0.1–1.0)
Monocytes Relative: 7 %
Neutro Abs: 3 10*3/uL (ref 1.7–7.7)
Neutrophils Relative %: 44 %

## 2019-05-13 LAB — POCT I-STAT EG7
Acid-base deficit: 4 mmol/L — ABNORMAL HIGH (ref 0.0–2.0)
Acid-base deficit: 4 mmol/L — ABNORMAL HIGH (ref 0.0–2.0)
Bicarbonate: 23.6 mmol/L (ref 20.0–28.0)
Bicarbonate: 24 mmol/L (ref 20.0–28.0)
Calcium, Ion: 1.23 mmol/L (ref 1.15–1.40)
Calcium, Ion: 1.27 mmol/L (ref 1.15–1.40)
HCT: 40 % (ref 36.0–46.0)
HCT: 42 % (ref 36.0–46.0)
Hemoglobin: 13.6 g/dL (ref 12.0–15.0)
Hemoglobin: 14.3 g/dL (ref 12.0–15.0)
O2 Saturation: 62 %
O2 Saturation: 66 %
Potassium: 4.2 mmol/L (ref 3.5–5.1)
Potassium: 4.3 mmol/L (ref 3.5–5.1)
Sodium: 129 mmol/L — ABNORMAL LOW (ref 135–145)
Sodium: 129 mmol/L — ABNORMAL LOW (ref 135–145)
TCO2: 25 mmol/L (ref 22–32)
TCO2: 26 mmol/L (ref 22–32)
pCO2, Ven: 54.2 mmHg (ref 44.0–60.0)
pCO2, Ven: 55.2 mmHg (ref 44.0–60.0)
pH, Ven: 7.247 — ABNORMAL LOW (ref 7.250–7.430)
pH, Ven: 7.247 — ABNORMAL LOW (ref 7.250–7.430)
pO2, Ven: 38 mmHg (ref 32.0–45.0)
pO2, Ven: 40 mmHg (ref 32.0–45.0)

## 2019-05-13 LAB — COMPREHENSIVE METABOLIC PANEL
ALT: 27 U/L (ref 0–44)
AST: 29 U/L (ref 15–41)
Albumin: 3.9 g/dL (ref 3.5–5.0)
Alkaline Phosphatase: 51 U/L (ref 38–126)
Anion gap: 12 (ref 5–15)
BUN: 19 mg/dL (ref 8–23)
CO2: 26 mmol/L (ref 22–32)
Calcium: 9.4 mg/dL (ref 8.9–10.3)
Chloride: 95 mmol/L — ABNORMAL LOW (ref 98–111)
Creatinine, Ser: 0.74 mg/dL (ref 0.44–1.00)
GFR calc Af Amer: >60 mL/min (ref >60)
GFR calc non Af Amer: >60 mL/min (ref >60)
Glucose, Bld: 131 mg/dL — ABNORMAL HIGH (ref 70–99)
Potassium: 4 mmol/L (ref 3.5–5.1)
Sodium: 133 mmol/L — ABNORMAL LOW (ref 135–145)
Total Bilirubin: 1.1 mg/dL (ref 0.3–1.2)
Total Protein: 7.3 g/dL (ref 6.5–8.1)

## 2019-05-13 LAB — URINALYSIS, MICROSCOPIC (REFLEX)

## 2019-05-13 LAB — POCT I-STAT 7, (LYTES, BLD GAS, ICA,H+H)
Acid-base deficit: 4 mmol/L — ABNORMAL HIGH (ref 0.0–2.0)
Bicarbonate: 22.8 mmol/L (ref 20.0–28.0)
Calcium, Ion: 1.22 mmol/L (ref 1.15–1.40)
HCT: 41 % (ref 36.0–46.0)
Hemoglobin: 13.9 g/dL (ref 12.0–15.0)
O2 Saturation: 96 %
Potassium: 4.1 mmol/L (ref 3.5–5.1)
Sodium: 129 mmol/L — ABNORMAL LOW (ref 135–145)
TCO2: 24 mmol/L (ref 22–32)
pCO2 arterial: 48.6 mmHg — ABNORMAL HIGH (ref 32.0–48.0)
pH, Arterial: 7.279 — ABNORMAL LOW (ref 7.350–7.450)
pO2, Arterial: 96 mmHg (ref 83.0–108.0)

## 2019-05-13 LAB — CBC
HCT: 42.6 % (ref 36.0–46.0)
HCT: 40.7 % (ref 36.0–46.0)
Hemoglobin: 13.8 g/dL (ref 12.0–15.0)
Hemoglobin: 13.8 g/dL (ref 12.0–15.0)
MCH: 29.8 pg (ref 26.0–34.0)
MCH: 30.5 pg (ref 26.0–34.0)
MCHC: 32.4 g/dL (ref 30.0–36.0)
MCHC: 33.9 g/dL (ref 30.0–36.0)
MCV: 92 fL (ref 80.0–100.0)
MCV: 89.8 fL (ref 80.0–100.0)
Platelets: 265 10*3/uL (ref 150–400)
Platelets: 234 10*3/uL (ref 150–400)
RBC: 4.63 MIL/uL (ref 3.87–5.11)
RBC: 4.53 MIL/uL (ref 3.87–5.11)
RDW: 12.8 % (ref 11.5–15.5)
RDW: 12.8 % (ref 11.5–15.5)
WBC: 6.8 10*3/uL (ref 4.0–10.5)
WBC: 8.8 10*3/uL (ref 4.0–10.5)
nRBC: 0 % (ref 0.0–0.2)
nRBC: 0 % (ref 0.0–0.2)

## 2019-05-13 LAB — AMMONIA: Ammonia: 9 umol/L (ref 9–35)

## 2019-05-13 LAB — CREATININE, SERUM
Creatinine, Ser: 0.81 mg/dL (ref 0.44–1.00)
GFR calc Af Amer: >60 mL/min (ref >60)
GFR calc non Af Amer: >60 mL/min (ref >60)

## 2019-05-13 LAB — URINALYSIS, ROUTINE W REFLEX MICROSCOPIC
Bilirubin Urine: NEGATIVE
Glucose, UA: NEGATIVE mg/dL
Ketones, ur: NEGATIVE mg/dL
Leukocytes,Ua: NEGATIVE
Nitrite: NEGATIVE
Protein, ur: NEGATIVE mg/dL
Specific Gravity, Urine: 1.015 (ref 1.005–1.030)
pH: 7.5 (ref 5.0–8.0)

## 2019-05-13 LAB — RAPID URINE DRUG SCREEN, HOSP PERFORMED
Amphetamines: NOT DETECTED
Barbiturates: NOT DETECTED
Benzodiazepines: NOT DETECTED
Cocaine: NOT DETECTED
Opiates: NOT DETECTED
Tetrahydrocannabinol: NOT DETECTED

## 2019-05-13 LAB — TSH
TSH: 2.363 u[IU]/mL (ref 0.350–4.500)
TSH: 1.406 u[IU]/mL (ref 0.350–4.500)

## 2019-05-13 LAB — APTT: aPTT: 27 seconds (ref 24–36)

## 2019-05-13 LAB — PROTIME-INR
INR: 0.9 (ref 0.8–1.2)
Prothrombin Time: 12.4 seconds (ref 11.4–15.2)

## 2019-05-13 LAB — TROPONIN I (HIGH SENSITIVITY)
Troponin I (High Sensitivity): 37 ng/L — ABNORMAL HIGH (ref <18)
Troponin I (High Sensitivity): 879 ng/L (ref <18)

## 2019-05-13 LAB — SARS CORONAVIRUS 2 BY RT PCR (HOSPITAL ORDER, PERFORMED IN ~~LOC~~ HOSPITAL LAB): SARS Coronavirus 2: NEGATIVE

## 2019-05-13 LAB — POCT ACTIVATED CLOTTING TIME: Activated Clotting Time: 131 seconds

## 2019-05-13 LAB — CBG MONITORING, ED: Glucose-Capillary: 85 mg/dL (ref 70–99)

## 2019-05-13 LAB — BRAIN NATRIURETIC PEPTIDE: B Natriuretic Peptide: 450.2 pg/mL — ABNORMAL HIGH (ref 0.0–100.0)

## 2019-05-13 LAB — ETHANOL: Alcohol, Ethyl (B): <10 mg/dL (ref <10)

## 2019-05-13 SURGERY — RIGHT/LEFT HEART CATH AND CORONARY ANGIOGRAPHY
Anesthesia: LOCAL

## 2019-05-13 MED ORDER — ASPIRIN EC 81 MG PO TBEC
81.0000 mg | DELAYED_RELEASE_TABLET | Freq: Every day | ORAL | Status: DC
  Administered 2019-05-14 – 2019-05-15 (×2): 81 mg via ORAL
  Filled 2019-05-13 (×2): qty 1

## 2019-05-13 MED ORDER — OMEGA-3-ACID ETHYL ESTERS 1 G PO CAPS
1.0000 g | ORAL_CAPSULE | Freq: Every day | ORAL | Status: DC
  Administered 2019-05-14 – 2019-05-15 (×2): 1 g via ORAL
  Filled 2019-05-13 (×2): qty 1

## 2019-05-13 MED ORDER — MORPHINE SULFATE (PF) 4 MG/ML IV SOLN
INTRAVENOUS | Status: AC
  Administered 2019-05-13: 2 mg via INTRAVENOUS
  Filled 2019-05-13: qty 1

## 2019-05-13 MED ORDER — MORPHINE SULFATE (PF) 2 MG/ML IV SOLN
INTRAVENOUS | Status: AC
  Administered 2019-05-13: 2 mg via INTRAVENOUS
  Filled 2019-05-13: qty 1

## 2019-05-13 MED ORDER — SODIUM CHLORIDE 0.9 % IV SOLN: 250.0000 mL | INTRAVENOUS | Status: DC | PRN

## 2019-05-13 MED ORDER — ENOXAPARIN SODIUM 40 MG/0.4ML ~~LOC~~ SOLN: 40.0000 mg | SUBCUTANEOUS | Status: DC

## 2019-05-13 MED ORDER — ROSUVASTATIN CALCIUM 5 MG PO TABS
10.0000 mg | ORAL_TABLET | Freq: Every day | ORAL | Status: DC
  Filled 2019-05-13: qty 2

## 2019-05-13 MED ORDER — NITROGLYCERIN IN D5W 200-5 MCG/ML-% IV SOLN
INTRAVENOUS | Status: AC | PRN
  Administered 2019-05-13: 10 ug/min via INTRAVENOUS

## 2019-05-13 MED ORDER — FUROSEMIDE 10 MG/ML IJ SOLN
INTRAMUSCULAR | Status: AC
  Filled 2019-05-13: qty 4

## 2019-05-13 MED ORDER — MORPHINE SULFATE (PF) 4 MG/ML IV SOLN
4.0000 mg | Freq: Once | INTRAVENOUS | Status: AC
  Administered 2019-05-13: 12:00:00 2 mg via INTRAVENOUS

## 2019-05-13 MED ORDER — SODIUM CHLORIDE 0.9% FLUSH: 3.0000 mL | INTRAVENOUS | Status: DC | PRN

## 2019-05-13 MED ORDER — ASPIRIN 81 MG PO CHEW
324.0000 mg | CHEWABLE_TABLET | Freq: Once | ORAL | Status: AC
  Administered 2019-05-13: 243 mg via ORAL

## 2019-05-13 MED ORDER — SODIUM CHLORIDE 0.9% FLUSH
3.0000 mL | Freq: Two times a day (BID) | INTRAVENOUS | Status: DC
  Administered 2019-05-14 – 2019-05-15 (×2): 3 mL via INTRAVENOUS

## 2019-05-13 MED ORDER — NITROGLYCERIN IN D5W 200-5 MCG/ML-% IV SOLN
INTRAVENOUS | Status: AC
  Filled 2019-05-13: qty 250

## 2019-05-13 MED ORDER — FUROSEMIDE 10 MG/ML IJ SOLN
INTRAMUSCULAR | Status: DC | PRN
  Administered 2019-05-13: 40 mg via INTRAVENOUS

## 2019-05-13 MED ORDER — IOHEXOL 350 MG/ML SOLN
INTRAVENOUS | Status: DC | PRN
  Administered 2019-05-13: 75 mL

## 2019-05-13 MED ORDER — METOPROLOL SUCCINATE ER 25 MG PO TB24
25.0000 mg | ORAL_TABLET | Freq: Every day | ORAL | Status: DC
  Administered 2019-05-14 – 2019-05-15 (×2): 25 mg via ORAL
  Filled 2019-05-13 (×2): qty 1

## 2019-05-13 MED ORDER — ADULT MULTIVITAMIN W/MINERALS CH
1.0000 | ORAL_TABLET | Freq: Every day | ORAL | Status: DC
  Administered 2019-05-14 – 2019-05-15 (×2): 1 via ORAL
  Filled 2019-05-13 (×2): qty 1

## 2019-05-13 MED ORDER — ASPIRIN 81 MG PO CHEW
CHEWABLE_TABLET | ORAL | Status: AC
  Filled 2019-05-13: qty 4

## 2019-05-13 MED ORDER — CHLORHEXIDINE GLUCONATE CLOTH 2 % EX PADS
6.0000 | MEDICATED_PAD | Freq: Every day | CUTANEOUS | Status: DC
  Administered 2019-05-15: 6 via TOPICAL

## 2019-05-13 MED ORDER — HEPARIN (PORCINE) IN NACL 1000-0.9 UT/500ML-% IV SOLN
INTRAVENOUS | Status: AC
  Filled 2019-05-13: qty 1000

## 2019-05-13 MED ORDER — LORATADINE 10 MG PO TABS
10.0000 mg | ORAL_TABLET | Freq: Every day | ORAL | Status: DC
  Filled 2019-05-13: qty 1

## 2019-05-13 MED ORDER — ONDANSETRON HCL 4 MG/2ML IJ SOLN
INTRAMUSCULAR | Status: AC
  Filled 2019-05-13: qty 2

## 2019-05-13 MED ORDER — LIDOCAINE HCL (PF) 1 % IJ SOLN
INTRAMUSCULAR | Status: AC
  Filled 2019-05-13: qty 30

## 2019-05-13 MED ORDER — LIDOCAINE HCL (PF) 1 % IJ SOLN
INTRAMUSCULAR | Status: DC | PRN
  Administered 2019-05-13: 15 mL
  Administered 2019-05-13: 2 mL

## 2019-05-13 MED ORDER — ENOXAPARIN SODIUM 40 MG/0.4ML ~~LOC~~ SOLN
40.0000 mg | SUBCUTANEOUS | Status: DC
  Administered 2019-05-14: 40 mg via SUBCUTANEOUS
  Filled 2019-05-13: qty 0.4

## 2019-05-13 MED ORDER — HEPARIN BOLUS VIA INFUSION
3500.0000 [IU] | Freq: Once | INTRAVENOUS | Status: AC
  Administered 2019-05-13: 3500 [IU] via INTRAVENOUS

## 2019-05-13 MED ORDER — FUROSEMIDE 10 MG/ML IJ SOLN
40.0000 mg | Freq: Two times a day (BID) | INTRAMUSCULAR | Status: DC
  Administered 2019-05-14 (×2): 40 mg via INTRAVENOUS
  Filled 2019-05-13 (×2): qty 4

## 2019-05-13 MED ORDER — ONDANSETRON HCL 4 MG/2ML IJ SOLN: 4.0000 mg | Freq: Once | INTRAMUSCULAR | Status: DC

## 2019-05-13 MED ORDER — ASPIRIN 81 MG PO CHEW: 324.0000 mg | CHEWABLE_TABLET | Freq: Once | ORAL | Status: DC

## 2019-05-13 MED ORDER — HEPARIN (PORCINE) IN NACL 1000-0.9 UT/500ML-% IV SOLN
INTRAVENOUS | Status: DC | PRN
  Administered 2019-05-13 (×2): 500 mL

## 2019-05-13 MED ORDER — ONDANSETRON HCL 4 MG/2ML IJ SOLN: 4.0000 mg | Freq: Four times a day (QID) | INTRAMUSCULAR | Status: DC | PRN

## 2019-05-13 MED ORDER — VERAPAMIL HCL 2.5 MG/ML IV SOLN
INTRAVENOUS | Status: DC | PRN
  Administered 2019-05-13: 10 mL via INTRA_ARTERIAL

## 2019-05-13 MED ORDER — VERAPAMIL HCL 2.5 MG/ML IV SOLN
INTRAVENOUS | Status: AC
  Filled 2019-05-13: qty 2

## 2019-05-13 MED ORDER — ONDANSETRON HCL 4 MG/2ML IJ SOLN
4.0000 mg | Freq: Once | INTRAMUSCULAR | Status: AC
  Administered 2019-05-13: 4 mg via INTRAVENOUS

## 2019-05-13 MED ORDER — SODIUM CHLORIDE 0.9% FLUSH
3.0000 mL | Freq: Two times a day (BID) | INTRAVENOUS | Status: DC
  Administered 2019-05-14 (×2): 3 mL via INTRAVENOUS

## 2019-05-13 MED ORDER — LISINOPRIL 20 MG PO TABS
20.0000 mg | ORAL_TABLET | Freq: Every day | ORAL | Status: DC
  Filled 2019-05-13: qty 1

## 2019-05-13 MED ORDER — ALPRAZOLAM 0.25 MG PO TABS: 0.2500 mg | ORAL_TABLET | Freq: Once | ORAL | Status: DC | PRN

## 2019-05-13 MED ORDER — HEPARIN (PORCINE) 25000 UT/250ML-% IV SOLN
700.0000 [IU]/h | INTRAVENOUS | Status: DC
  Administered 2019-05-13: 700 [IU]/h via INTRAVENOUS
  Filled 2019-05-13: qty 250

## 2019-05-13 MED ORDER — ACETAMINOPHEN 325 MG PO TABS: 650.0000 mg | ORAL_TABLET | ORAL | Status: DC | PRN

## 2019-05-13 MED ORDER — IOHEXOL 350 MG/ML SOLN
100.0000 mL | Freq: Once | INTRAVENOUS | Status: AC | PRN
  Administered 2019-05-13: 100 mL via INTRAVENOUS

## 2019-05-13 SURGICAL SUPPLY — 18 items
CATH 5FR JL3.5 JR4 ANG PIG MP (CATHETERS) ×2 IMPLANT
CATH SWAN GANZ 7F STRAIGHT (CATHETERS) ×2 IMPLANT
DEVICE RAD COMP TR BAND LRG (VASCULAR PRODUCTS) ×2 IMPLANT
ELECT DEFIB PAD ADLT CADENCE (PAD) ×2 IMPLANT
GLIDESHEATH SLEND SS 6F .021 (SHEATH) ×2 IMPLANT
GUIDEWIRE INQWIRE 1.5J.035X260 (WIRE) ×1 IMPLANT
INQWIRE 1.5J .035X260CM (WIRE) ×2
KIT HEART LEFT (KITS) ×2 IMPLANT
PACK CARDIAC CATHETERIZATION (CUSTOM PROCEDURE TRAY) ×2 IMPLANT
SHEATH PINNACLE 5F 10CM (SHEATH) ×2 IMPLANT
SHEATH PINNACLE 7F 10CM (SHEATH) ×4 IMPLANT
SHEATH PROBE COVER 6X72 (BAG) ×2 IMPLANT
SYR MEDRAD MARK 7 150ML (SYRINGE) ×2 IMPLANT
TRANSDUCER W/STOPCOCK (MISCELLANEOUS) ×2 IMPLANT
TUBING CIL FLEX 10 FLL-RA (TUBING) ×2 IMPLANT
WIRE EMERALD 3MM-J .025X260CM (WIRE) ×2 IMPLANT
WIRE EMERALD 3MM-J .035X150CM (WIRE) ×2 IMPLANT
WIRE HI TORQ VERSACORE-J 145CM (WIRE) ×2 IMPLANT

## 2019-05-13 NOTE — Progress Notes (Signed)
   RN asked if patient needed additional dose of Lasix 40mg  tonight. She received dose of IV Lasix 40mg  at 1555 during heart cath after becoming short of breath. LVEDP moderately elevated. BNP elevated in the 400's. IV Lasix 40mg  twice daily was added after cath. BP soft at 97/64. Breathing has improved. O2 sats in the 90's. Minimal crackles note in bases. Given IV Lasix was given about 4 hours ago, breathing has improved, and BP are now soft; OK to hold additional dose of Lasix at this time. However, if breathing worsens tonight may need additional dose. Patient also very agitated. Daughter, Earnest Bailey, asked if she could be given anything for this. Discussed with Dr. Radford Pax - will put in one time dose for Xanax 0.25mg  PRN.  Darreld Mclean, PA-C 05/13/2019 7:48 PM

## 2019-05-13 NOTE — ED Provider Notes (Signed)
[facility] EMERGENCY DEPARTMENT Provider Note   CSN: [id number] Arrival date & time: 05/13/19  [id number]     History   Chief Complaint Chief Complaint  Patient presents with  . Dizziness    HPI Barbara Mcconnell is a 84 y.o. female.     HPI Patient reports that she felt well this morning when she got up at about 8 AM.  She reports that she was having her garage door replaced and had interacted with the workers.  At about 830 she started to feel nauseated and lightheaded and slightly dizzy.  She called her daughter to talk to her about it.  Her daughter reports that she seemed like she was maybe even slightly confused and was talking about lightheadedness as well as nausea and a feeling of heartburn or slight chest tightness.  Her daughter wanted her to take EMS but she refused so she drove over to pick her up.  She reports that after about 45 minutes the symptoms had resolved.  At the time of my interview the patient no longer has a chest pressure sensation does not feel nauseated or lightheaded.  Her daughter reports that she had a similar episode about 5 years ago whereupon she was seen at the emergency department and her symptoms seem fairly similar and they were planning to discharge her but then something changed and she ended up getting admitted and evaluated for her heart.  She described being diagnosed with Takotsubo syndrome and not ever getting any stents placed.  She reports that she was told that her arteries were clear.  She had not had any ongoing problems since that time.  As an aside, patient does note that she thought she was having a little bit of low back discomfort that was consistent with a UTI.  She started some antibiotics she had leftover from a prescription several days ago.  She was not noticing any change.  She felt like she got some loose stool and stopped antibiotics. Past Medical History:  Diagnosis Date  . Coronary artery disease   . Hyperlipemia   .  Hypertension     Patient Active Problem List   Diagnosis Date Noted  . NSTEMI (non-ST elevated myocardial infarction) (HCC) 05/13/2019    Past Surgical History:  Procedure Laterality Date  . TONSILLECTOMY       OB History   No obstetric history on file.      Home Medications    Prior to Admission medications   Medication Sig Start Date End Date Taking? Authorizing Provider  fexofenadine (ALLEGRA) 180 MG tablet Take 180 mg by mouth daily.      [provider]  ibuprofen (ADVIL,MOTRIN) 200 MG tablet Take 200 mg by mouth every 6 (six) hours as needed. For pain     [provider]  lisinopril-hydrochlorothiazide (PRINZIDE,ZESTORETIC) 20-25 MG per tablet Take 1 tablet by mouth daily.      [provider]  metoprolol succinate (TOPROL-XL) 25 MG 24 hr tablet Take 25 mg by mouth daily.      [provider]  Multiple Vitamin (MULTIVITAMIN) tablet Take 1 tablet by mouth daily.      [provider]  Omega-3 Fatty Acids (FISH OIL) 1200 MG CAPS Take 1 capsule by mouth daily.      [provider]  rosuvastatin (CRESTOR) 10 MG tablet Take 10 mg by mouth daily.      [provider]  Tetrahydrozoline HCl (VISINE OP) Apply 1 drop to eye daily.  [provider]    Family History No family history on file.  Social History Social History   Tobacco Use  . Smoking status: Never Smoker  . Smokeless tobacco: Never Used  Substance Use Topics  . Alcohol use: No  . Drug use: No     Allergies   Codeine and Prednisone   Review of Systems Review of Systems 10 Systems reviewed and are negative for acute change except as noted in the HPI.   Physical Exam Updated Vital Signs BP 102/71   Pulse 83   Temp (!) 97.5 F (36.4 C) (Oral)   Resp 16   SpO2 (!) 89%   Physical Exam Constitutional:      Appearance: She is well-developed.     Comments: Patient is in excellent condition.  She is alert and appropriate.   Well-nourished well-developed.  No respiratory distress.  HENT:     Head: Normocephalic and atraumatic.  Eyes:     Pupils: Pupils are equal, round, and reactive to light.  Neck:     Musculoskeletal: Neck supple.  Cardiovascular:     Rate and Rhythm: Normal rate and regular rhythm.     Heart sounds: Normal heart sounds.  Pulmonary:     Effort: Pulmonary effort is normal.     Breath sounds: Normal breath sounds.  Abdominal:     General: Bowel sounds are normal. There is no distension.     Palpations: Abdomen is soft.     Tenderness: There is no abdominal tenderness.  Musculoskeletal: Normal range of motion.     Right lower leg: No edema.     Left lower leg: No edema.     Comments: Extremities are in very good condition.  Well muscled.  No peripheral edema calves are soft.  Skin:    General: Skin is warm and dry.  Neurological:     Mental Status: She is alert and oriented to person, place, and time.     GCS: GCS eye subscore is 4. GCS verbal subscore is 5. GCS motor subscore is 6.     Coordination: Coordination normal.      ED Treatments / Results  Labs (all labs ordered are listed, but only abnormal results are displayed) Labs Reviewed  COMPREHENSIVE METABOLIC PANEL - Abnormal; Notable for the following components:      Result Value   Sodium 133 (*)    Chloride 95 (*)    Glucose, Bld 131 (*)    All other components within normal limits  URINALYSIS, ROUTINE W REFLEX MICROSCOPIC - Abnormal; Notable for the following components:   APPearance CLOUDY (*)    Hgb urine dipstick TRACE (*)    All other components within normal limits  URINALYSIS, MICROSCOPIC (REFLEX) - Abnormal; Notable for the following components:   Bacteria, UA RARE (*)    All other components within normal limits  TROPONIN I (HIGH SENSITIVITY) - Abnormal; Notable for the following components:   Troponin I (High Sensitivity) 37 (*)    All other components within normal limits  TROPONIN I (HIGH SENSITIVITY)  - Abnormal; Notable for the following components:   Troponin I (High Sensitivity) 879 (*)    All other components within normal limits  SARS CORONAVIRUS 2 BY RT PCR (HOSPITAL ORDER, PERFORMED IN [facility])  ETHANOL  PROTIME-INR  APTT  CBC  DIFFERENTIAL  RAPID URINE DRUG SCREEN, HOSP PERFORMED  AMMONIA  TSH  HEPARIN LEVEL (UNFRACTIONATED)  CBG MONITORING, ED    EKG EKG Interpretation  Date/Time:  Monday May 13 2019 09:47:20 EDT Ventricular Rate:  82 PR Interval:    QRS Duration: 105 QT Interval:  383 QTC Calculation: 448 R Axis:   77 Text Interpretation:  Sinus rhythm LAE, consider biatrial enlargement Left ventricular hypertrophy Minimal ST elevation, inferior leads t waves more acute c/w previous Confirmed by Arby Barrette [phone]) on 05/13/2019 9:51:52 AM   Radiology Ct Head Wo Contrast  Result Date: 05/13/2019 CLINICAL DATA:  Lightheaded.  Flush. EXAM: CT HEAD WITHOUT CONTRAST TECHNIQUE: Contiguous axial images were obtained from the base of the skull through the vertex without intravenous contrast. COMPARISON:  None. FINDINGS: Brain: No subdural, epidural, or subarachnoid hemorrhage. The cerebellum, brainstem, and basal cisterns are normal. No mass effect or midline shift. Ventricles and sulci are unremarkable. No acute cortical ischemia or infarct. Vascular: No hyperdense vessel or unexpected calcification. Skull: Normal. Negative for fracture or focal lesion. Sinuses/Orbits: No acute finding. Other: None. IMPRESSION: No acute intracranial abnormalities. Electronically Signed   By: Gerome Sam III M.D   On: 05/13/2019 10:18    Procedures Procedures (including critical care time) CRITICAL CARE Performed by: Arby Barrette   Total critical care time: 60 minutes  Critical care time was exclusive of separately billable procedures and treating other patients.  Critical care was necessary to treat or prevent imminent or life-threatening  deterioration.  Critical care was time spent personally by me on the following activities: development of treatment plan with patient and/or surrogate as well as nursing, discussions with consultants, evaluation of patient's response to treatment, examination of patient, obtaining history from patient or surrogate, ordering and performing treatments and interventions, ordering and review of laboratory studies, ordering and review of radiographic studies, pulse oximetry and re-evaluation of patient's condition. Medications Ordered in ED Medications  heparin ADULT infusion 100 units/mL (25000 units/242mL sodium chloride 0.45%) (700 Units/hr Intravenous New Bag/Given 05/13/19 1202)  ondansetron (ZOFRAN) injection 4 mg (4 mg Intravenous Given 05/13/19 1130)  morphine 4 MG/ML injection 4 mg (2 mg Intravenous Given 05/13/19 1146)  aspirin chewable tablet 324 mg (243 mg Oral Given 05/13/19 1135)  heparin bolus via infusion 3,500 Units (3,500 Units Intravenous Bolus from Bag 05/13/19 1203)  iohexol (OMNIPAQUE) 350 MG/ML injection 100 mL (100 mLs Intravenous Contrast Given 05/13/19 1300)     Initial Impression / Assessment and Plan / ED Course  I have reviewed the triage vital signs and the nursing notes.  Pertinent labs & imaging results that were available during my care of the patient were reviewed by me and considered in my medical decision making (see chart for details).  Clinical Course as of May 12 1332  Mon May 13, 2019  1135 Consult: Reviewed with Dr. Swaziland.  He has reviewed the EKGs and made the comparison reviews.  EKGs do not meet STEMI criteria.  Agrees with initiating heparin and admission to hospitalist service with consultation to cardiology.   [MP]  1224 Consult:[facility] hospitalist dr. Jacqulyn Bath accepts for admission.  I have reviewed the HPI and consult with Dr. [place] of cardiology.   [MP]  1241 Patient had a repeat episode of getting nauseated and clammy.  Mental status remains  clear.  No respiratory distress.  Patient did become hypotensive with systolic blood pressures reading in the high 70s.  Heart rhythm is still sinus and regular.  Repeat EKG checked with no changes.  Patient reports now that she feels like there is some discomfort or bloating in her abdomen.  She describes having taken some antibiotics  several days ago believing that she might of had a UTI.  She reports having some loose stool.  We will proceed with CT to rule out any intrathoracic or intra-abdominal structural anomaly.  Patient is getting a liter fluid bolus.  Pressures are already rebounding to the 80'X systolic.   [MP]  1331 Reviewed with Wannetta Sender that the patient has had stuttering chest pain episodes and now troponin elevation.  She has called Dr. Martinique and they will take directly to the Cath Lab.  [facility] has been called for transport.  Patient is alert and appropriate.  No signs of confusion.  No respiratory distress.   [MP]    Clinical Course User Index [MP] Charlesetta Shanks, MD       Final Clinical Impressions(s) / ED Diagnoses   Final diagnoses:  Chest pain, unspecified type  Lightheadedness  Nausea  Troponin I above reference range  NSTEMI (non-ST elevated myocardial infarction) [facility]    ED Discharge Orders    None       Charlesetta Shanks, MD 05/13/19 1338

## 2019-05-13 NOTE — H&P (Signed)
Cardiology Admission History and Physical:   Patient ID: Barbara Mcconnell MRN: [id number]; DOB: 06/21/1935   Admission date: 05/13/2019  Primary Care Provider: Kristopher Mcconnell., MD Primary Cardiologist: New Primary Electrophysiologist:  None   Chief Complaint:  Chest tightness  Patient Profile:   Barbara Mcconnell is a 84 y.o. female with history of HTN, HLD, carotid disease and prior history of Takotsubo's disease presents in transfer from [facility] [place] [facility] with chest pain, diaphoresis and SOB. Troponin elevation  History of Present Illness:   Barbara Mcconnell is a pleasant 84 yo WF with history of HTN and HLD. Has moderate carotid disease. Reports hospitalization 5 years ago. Was told she had Takotsubo cardiomyopathy. Had cardiac cath with some blockage noted. Was treated medically and apparently Echo showed good recovery. This am she was at home when she developed SS chest tightness like indigestion. She became very weak and diaphoretic. She went to [place]. Ecg was not diagnostic. Showed LVH. While in ED she went to [facility] and again developed symptoms of diaphoresis and became severely hypotensive with BP 70 systolic. Symptoms resolved. She has had intermittent chest pain since then relieved with MSO4. On transfer here she became acutely SOB just transferring from stretcher.   Heart Pathway Score:     Past Medical History:  Diagnosis Date   Coronary artery disease    Hyperlipemia    Hypertension     Past Surgical History:  Procedure Laterality Date   TONSILLECTOMY       Medications Prior to Admission: Prior to Admission medications   Medication Sig Start Date End Date Taking? Authorizing Provider  fexofenadine (ALLEGRA) 180 MG tablet Take 180 mg by mouth daily.      [provider]  ibuprofen (ADVIL,MOTRIN) 200 MG tablet Take 200 mg by mouth every 6 (six) hours as needed. For pain     [provider]  lisinopril-hydrochlorothiazide (PRINZIDE,ZESTORETIC)  20-25 MG per tablet Take 1 tablet by mouth daily.      [provider]  metoprolol succinate (TOPROL-XL) 25 MG 24 hr tablet Take 25 mg by mouth daily.      [provider]  Multiple Vitamin (MULTIVITAMIN) tablet Take 1 tablet by mouth daily.      [provider]  Omega-3 Fatty Acids (FISH OIL) 1200 MG CAPS Take 1 capsule by mouth daily.      [provider]  rosuvastatin (CRESTOR) 10 MG tablet Take 10 mg by mouth daily.      [provider]  Tetrahydrozoline HCl (VISINE OP) Apply 1 drop to eye daily.      [provider]     Allergies:    Allergies  Allergen Reactions   Codeine Nausea And Vomiting and Nausea Only   Prednisone Other (See Comments)    Sores in mouth    Social History:   Social History   Socioeconomic History   Marital status: Divorced    Spouse name: Not on file   Number of children: Not on file   Years of education: Not on file   Highest education level: Not on file  Occupational History   Not on file  Social Needs   Financial resource strain: Not on file   Food insecurity    Worry: Not on file    Inability: Not on file   Transportation needs    Medical: Not on file    Non-medical: Not on file  Tobacco Use   Smoking status: Never Smoker  Smokeless tobacco: Never Used  Substance and Sexual Activity   Alcohol use: No   Drug use: No   Sexual activity: Not on file  Lifestyle   Physical activity    Days per week: Not on file    Minutes per session: Not on file   Stress: Not on file  Relationships   Social connections    Talks on phone: Not on file    Gets together: Not on file    Attends religious service: Not on file    Active member of club or organization: Not on file    Attends meetings of clubs or organizations: Not on file    Relationship status: Not on file   Intimate partner violence    Fear of current or ex partner: Not on file    Emotionally abused: Not on file     Physically abused: Not on file    Forced sexual activity: Not on file  Other Topics Concern   Not on file  Social History Narrative   Not on file    Family History:  Father died of silicosis. Mother died of cancer age 77. She is one of 10 siblings. No known cardiac history.  The patient's family history is not on file.    ROS:  Please see the history of present illness.  All other ROS reviewed and negative.     Physical Exam/Data:   Vitals:   05/13/19 1345 05/13/19 1357 05/13/19 1400 05/13/19 1455  BP: 105/67  101/60 130/66  Pulse: 80  84   Resp: (!) 22  (!) 29 19  Temp:      TempSrc:      SpO2: 94%  100%   Weight:  56.2 kg    Height:  _0  (1.549 m)     No intake or output data in the 24 hours ending 05/13/19 1501 Last 3 Weights 05/13/2019 06/09/2016 09/23/2013  Weight (lbs) 124 lb 130 lb 138 lb  Weight (kg) 56.246 kg 58.968 kg 62.596 kg     Body mass index is 23.43 kg/m.  General:  Well nourished, well developed, in no acute distress HEENT: normal Lymph: no adenopathy Neck: no JVD, soft carotid bruit.  Endocrine:  No thryomegaly Vascular: No carotid bruits; FA pulses 2+ bilaterally without bruits  Cardiac:  normal S1, S2; RRR; no murmur  Lungs:  Patient appears dyspneic. Mild bilateral wheezes.  Abd: soft, nontender, no hepatomegaly  Ext: no edema Musculoskeletal:  No deformities, BUE and BLE strength normal and equal Skin: warm and dry  Neuro:  CNs 2-12 intact, no focal abnormalities noted Psych:  Normal affect    EKG:  The ECG that was done today was personally reviewed and demonstrates NSR with LVH. I have personally reviewed and interpreted this study.   Relevant CV Studies: none  Laboratory Data:  High Sensitivity Troponin:   Recent Labs  Lab 05/13/19 0952 05/13/19 1235  TROPONINIHS 37* 879*      Chemistry Recent Labs  Lab 05/13/19 0952  NA 133*  K 4.0  CL 95*  CO2 26  GLUCOSE 131*  BUN 19  CREATININE 0.74  CALCIUM 9.4  GFRNONAA >60   GFRAA >60  ANIONGAP 12    Recent Labs  Lab 05/13/19 0952  PROT 7.3  ALBUMIN 3.9  AST 29  ALT 27  ALKPHOS 51  BILITOT 1.1   Hematology Recent Labs  Lab 05/13/19 0952  WBC 6.8  RBC 4.63  HGB 13.8  HCT 42.6  MCV  92.0  MCH 29.8  MCHC 32.4  RDW 12.8  PLT 265   BNPNo results for input(s): BNP, PROBNP in the last 168 hours.  DDimer No results for input(s): DDIMER in the last 168 hours.   Radiology/Studies:  Ct Head Wo Contrast  Result Date: 05/13/2019 CLINICAL DATA:  Lightheaded.  Flush. EXAM: CT HEAD WITHOUT CONTRAST TECHNIQUE: Contiguous axial images were obtained from the base of the skull through the vertex without intravenous contrast. COMPARISON:  None. FINDINGS: Brain: No subdural, epidural, or subarachnoid hemorrhage. The cerebellum, brainstem, and basal cisterns are normal. No mass effect or midline shift. Ventricles and sulci are unremarkable. No acute cortical ischemia or infarct. Vascular: No hyperdense vessel or unexpected calcification. Skull: Normal. Negative for fracture or focal lesion. Sinuses/Orbits: No acute finding. Other: None. IMPRESSION: No acute intracranial abnormalities. Electronically Signed   By: Dorise Bullion III M.D   On: 05/13/2019 10:18   Ct Angio Chest/abd/pel For Dissection W And/or W/wo  Result Date: 05/13/2019 CLINICAL DATA:  Lightheaded, flushed and disorientation. Weakness and chest pain and back pain. EXAM: CT ANGIOGRAPHY CHEST, ABDOMEN AND PELVIS TECHNIQUE: Multidetector CT imaging through the chest, abdomen and pelvis was performed using the standard protocol during bolus administration of intravenous contrast. Multiplanar reconstructed images and MIPs were obtained and reviewed to evaluate the vascular anatomy. CONTRAST:  157m OMNIPAQUE IOHEXOL 350 MG/ML SOLN COMPARISON:  CT abdomen/pelvis 11/24/2013 FINDINGS: CTA CHEST FINDINGS Cardiovascular: The heart is normal in size. No pericardial effusion. There is mild tortuosity, ectasia and  atherosclerotic calcification involving the thoracic aorta but no dissection or focal aneurysm. The branch vessels are patent. Scattered three-vessel coronary artery calcifications are noted. The pulmonary arteries appear normal. No filling defects to suggest pulmonary embolism. Mediastinum/Nodes: No mediastinal or hilar mass or adenopathy. The esophagus is grossly normal. Lungs/Pleura: Patchy peripheral tree-in-bud findings involving both lungs suggesting chronic inflammation or atypical infection such as MAC. Some of these findings are chronic when compared to the lung bases from the prior CT scan of the abdomen from 2015. I do not see any definite emphysematous changes. There are some patchy areas of bronchiectasis which could be related to remote infection. Subpleural atelectasis or scarring change noted in the right middle lobe and lingula. No worrisome pulmonary lesions. No focal airspace consolidation. No pleural effusions. Musculoskeletal: No breast masses, supraclavicular or axillary adenopathy. Small scattered lymph nodes are noted. No significant bony findings. Review of the MIP images confirms the above findings. CTA ABDOMEN AND PELVIS FINDINGS VASCULAR Aorta: Moderate atherosclerotic calcifications most notably near the thoracoabdominal junction. No focal aneurysm or dissection. Celiac: Patent.  Some calcifications near the ostia. SMA: Patent. Renals: Moderate right and mild left proximal renal artery calcifications. Both renal arteries appears somewhat nodular and could not exclude component of FMD. IMA: Patent. Inflow: Moderate atherosclerotic calcifications. No significant stenosis, occlusion or aneurysm. Veins: Grossly normal. Review of the MIP images confirms the above findings. NON-VASCULAR Hepatobiliary: No obvious hepatic lesions or intrahepatic biliary dilatation. The gallbladder appears normal. No common bile duct dilatation. Pancreas: No mass, inflammation or ductal dilatation. Spleen: Normal  size.  No focal lesions. Adrenals/Urinary Tract: Adrenal glands and kidneys are grossly normal. No worrisome renal lesions or hydronephrosis. The bladder is unremarkable. Stomach/Bowel: The stomach, duodenum, small bowel and colon are grossly normal without oral contrast. No acute inflammatory changes, mass lesions or obstructive findings. Sigmoid colon diverticulosis but no findings for acute diverticulitis. Lymphatic: No abdominal or pelvic lymphadenopathy. Reproductive: The uterus and ovaries appear normal. Other: No  pelvic mass or free pelvic fluid collections. No inguinal mass or adenopathy. Musculoskeletal: No significant bony findings. Moderate degenerative lumbar spondylosis with degenerative anterolisthesis of L4. No worrisome bone lesions. Review of the MIP images confirms the above findings. IMPRESSION: 1. Atherosclerotic changes involving the thoracic and abdominal aorta and branch vessels but no aneurysm or dissection or occlusion. 2. Normal appearance of the pulmonary arteries. No findings to suggest pulmonary embolism. 3. Chronic appearing inflammatory changes in the lungs with tree-in-bud appearance. Atypical infection such as MAC is also possible. 4. No worrisome pulmonary lesions or acute pneumonia. 5. No acute abdominal/pelvic findings, mass lesions or lymphadenopathy. Electronically Signed   By: Marijo Sanes M.D.   On: 05/13/2019 13:31    Assessment and Plan:   1. NSTEMI. Troponin rising. Patient with intermittent chest pain throughout the day. Acutely SOB with any movement. Intermittent diaphoresis. Suspect high grade CAD although Takotsubo's cardiomyopathy is a possibility based on prior history 2. Hypercholesterolemia. On low dose Crestor. Will intensify statin therapy. Check lipid levels 3. HTN on lisinopril and metoprolol at home 4. Carotid arterial disease. Asymptomatic.   Plan: recommend urgent cardiac catheterization with possible PCI if indicated. Patient on ASA and IV  heparin. The procedure and risks were reviewed including but not limited to death, myocardial infarction, stroke, arrythmias, bleeding, transfusion, emergency surgery, dye allergy, or renal dysfunction. The patient voices understanding and is agreeable to proceed..   Severity of Illness: The appropriate patient status for this patient is INPATIENT. Inpatient status is judged to be reasonable and necessary in order to provide the required intensity of service to ensure the patient's safety. The patient's presenting symptoms, physical exam findings, and initial radiographic and laboratory data in the context of their chronic comorbidities is felt to place them at high risk for further clinical deterioration. Furthermore, it is not anticipated that the patient will be medically stable for discharge from the hospital within 2 midnights of admission. The following factors support the patient status of inpatient.   " The patient's presenting symptoms include acute chest pain, dyspnea. " The worrisome physical exam findings include wheezing. Carotid bruit. " The initial radiographic and laboratory data are worrisome because of rising troponin levels " The chronic co-morbidities include HTN, HLD, carotid disease.   * I certify that at the point of admission it is my clinical judgment that the patient will require inpatient hospital care spanning beyond 2 midnights from the point of admission due to high intensity of service, high risk for further deterioration and high frequency of surveillance required.*    For questions or updates, please contact [place] Please consult [email or web address] for contact info under        Signed, Vidyuth Belsito Martinique, MD  05/13/2019 3:01 PM

## 2019-05-13 NOTE — ED Triage Notes (Signed)
Pt lightheaded, flushed and warm this am.  Pt states she started feeling bad at 0830 this am.  Daughter states she spoke to her on the phone 0845, states she was having some disorientation.  Pt states she woke up fine this am.  Started to feel weak with some chest tightness.  Pt admits to lower back pain for 2 days.  No obvious weakness.  No LOC.

## 2019-05-13 NOTE — Progress Notes (Signed)
Site area: right groin  Site Prior to Removal:  Level 0  Pressure Applied For 20 MINUTES    Minutes Beginning at 1730  Manual:   Yes.    Patient Status During Pull:  Stable  Post Pull Groin Site:  Level 0  Post Pull Instructions Given:  Yes.    Post Pull Pulses Present:  Yes.    Dressing Applied:  Yes.    Comments:  Bed rest started at 1745 X  4 hr

## 2019-05-13 NOTE — ED Notes (Signed)
Pt's daughter has come out to nurses station.  Pt states she doesn't "Feel right".  Went to check on patient.  Pt states she thinks her kidneys are not doing right.  Second troponin drawn from IV site

## 2019-05-13 NOTE — Progress Notes (Signed)
ANTICOAGULATION CONSULT NOTE - Initial Consult  Pharmacy Consult for heparin Indication: chest pain/ACS   Patient Measurements: Heparin Dosing Weight: 58.5 kg  Vital Signs: Temp: 97.5 F (36.4 C) (10/19 0957) Temp Source: Oral (10/19 0957) BP: 115/71 (10/19 1100) Pulse Rate: 79 (10/19 1100)  Labs: Recent Labs    05/13/19 0952  HGB 13.8  HCT 42.6  PLT 265  APTT 27  LABPROT 12.4  INR 0.9  CREATININE 0.74  TROPONINIHS 37*     Medical History: Past Medical History:  Diagnosis Date  . Coronary artery disease   . Hyperlipemia   . Hypertension     Assessment: 84 yo female admitted with chest tightness and weakness. Planning to initiate heparin for possible ACS. Initial troponin is slightly elevated. SCr is wnl, h/h plts wnl.  Goal of Therapy:  Heparin level 0.3-0.7 units/ml Monitor platelets by anticoagulation protocol: Yes   Plan:  -Heparin bolus 3500 units x1 then 700 units/hr -Daily HL, CBC -Check level this evening   Harvel Quale 05/13/2019,11:36 AM

## 2019-05-14 ENCOUNTER — Inpatient Hospital Stay (HOSPITAL_COMMUNITY): Payer: Medicare HMO

## 2019-05-14 ENCOUNTER — Other Ambulatory Visit: Payer: Self-pay

## 2019-05-14 ENCOUNTER — Encounter (HOSPITAL_COMMUNITY): Payer: Self-pay | Admitting: Cardiology

## 2019-05-14 DIAGNOSIS — I361 Nonrheumatic tricuspid (valve) insufficiency: Secondary | ICD-10-CM

## 2019-05-14 DIAGNOSIS — I340 Nonrheumatic mitral (valve) insufficiency: Secondary | ICD-10-CM

## 2019-05-14 DIAGNOSIS — I5021 Acute systolic (congestive) heart failure: Secondary | ICD-10-CM

## 2019-05-14 LAB — BASIC METABOLIC PANEL
Anion gap: 14 (ref 5–15)
BUN: 21 mg/dL (ref 8–23)
CO2: 22 mmol/L (ref 22–32)
Calcium: 9.2 mg/dL (ref 8.9–10.3)
Chloride: 92 mmol/L — ABNORMAL LOW (ref 98–111)
Creatinine, Ser: 1.21 mg/dL — ABNORMAL HIGH (ref 0.44–1.00)
GFR calc Af Amer: 48 mL/min — ABNORMAL LOW (ref >60)
GFR calc non Af Amer: 42 mL/min — ABNORMAL LOW (ref >60)
Glucose, Bld: 155 mg/dL — ABNORMAL HIGH (ref 70–99)
Potassium: 3.9 mmol/L (ref 3.5–5.1)
Sodium: 128 mmol/L — ABNORMAL LOW (ref 135–145)

## 2019-05-14 LAB — CBC
HCT: 38.7 % (ref 36.0–46.0)
Hemoglobin: 13.2 g/dL (ref 12.0–15.0)
MCH: 30.5 pg (ref 26.0–34.0)
MCHC: 34.1 g/dL (ref 30.0–36.0)
MCV: 89.4 fL (ref 80.0–100.0)
Platelets: 248 10*3/uL (ref 150–400)
RBC: 4.33 MIL/uL (ref 3.87–5.11)
RDW: 12.7 % (ref 11.5–15.5)
WBC: 9.5 10*3/uL (ref 4.0–10.5)
nRBC: 0 % (ref 0.0–0.2)

## 2019-05-14 LAB — ECHOCARDIOGRAM COMPLETE
Height: 61 in
Weight: 1992.96 oz

## 2019-05-14 MED ORDER — CETIRIZINE HCL 10 MG PO TABS
10.0000 mg | ORAL_TABLET | Freq: Every day | ORAL | Status: DC
  Administered 2019-05-15: 10 mg via ORAL
  Filled 2019-05-14 (×2): qty 1

## 2019-05-14 MED ORDER — ENOXAPARIN SODIUM 30 MG/0.3ML ~~LOC~~ SOLN
30.0000 mg | SUBCUTANEOUS | Status: DC
  Filled 2019-05-14: qty 0.3

## 2019-05-14 MED ORDER — LISINOPRIL 20 MG PO TABS
20.0000 mg | ORAL_TABLET | Freq: Every day | ORAL | Status: DC
  Administered 2019-05-14: 20 mg via ORAL
  Filled 2019-05-14: qty 1

## 2019-05-14 MED ORDER — ROSUVASTATIN CALCIUM 5 MG PO TABS
5.0000 mg | ORAL_TABLET | Freq: Every day | ORAL | Status: DC
  Administered 2019-05-14: 5 mg via ORAL

## 2019-05-14 NOTE — Progress Notes (Addendum)
The patient has been seen in conjunction with Obed Agyei. All aspects of care have been considered and discussed. The patient has been personally interviewed, examined, and all clinical data has been reviewed.   Stress CM --> recurrent. EF 71%--> acute systolic HF without volume overload. Plan supportive care with RAAS and BB therapy. Care with diuretic therapy  Moderate AS and also dynamic LVOF (signal related to Takotsubo).  ECG shows evolution to prominent precordial TWI as can be seen with Stress CM  Monitor closely, watch for CHF development and transfer to floor late today or preferably in AM.  Progress Note  Patient Name: Barbara Mcconnell Date of Encounter: 05/14/2019  Primary Cardiologist: No primary care provider on file.   Subjective   Doing well this morning and denies chest pain or shortness of breath.   In 2015, chest x-ray reviewed up with potential left lower lobe nodule for which patient was worried about.  However CT angiography chest that was performed yesterday does not reveal any evidence of pulmonary lesions.   Inpatient Medications    Scheduled Meds: . aspirin EC  81 mg Oral Daily  . Chlorhexidine Gluconate Cloth  6 each Topical Daily  . enoxaparin (LOVENOX) injection  40 mg Subcutaneous Q24H  . furosemide  40 mg Intravenous Q12H  . lisinopril  20 mg Oral Daily  . loratadine  10 mg Oral Daily  . metoprolol succinate  25 mg Oral Daily  . multivitamin with minerals  1 tablet Oral Daily  . omega-3 acid ethyl esters  1 g Oral Daily  . rosuvastatin  10 mg Oral Daily  . sodium chloride flush  3 mL Intravenous Q12H  . sodium chloride flush  3 mL Intravenous Q12H   Continuous Infusions: . sodium chloride    . sodium chloride     PRN Meds: sodium chloride, sodium chloride, acetaminophen, ALPRAZolam, ondansetron (ZOFRAN) IV, sodium chloride flush, sodium chloride flush   Vital Signs    Vitals:   05/14/19 0200 05/14/19 0300 05/14/19 0400 05/14/19 0500   BP: 100/61 103/66 117/67 (!) 106/58  Pulse: 79 78 93 85  Resp: (!) 26 (!) 25 (!) 23 (!) 31  Temp:   (!) 97.5 F (36.4 C)   TempSrc:   Axillary   SpO2: 100% 99% 96% 100%  Weight:      Height:        Intake/Output Summary (Last 24 hours) at 05/14/2019 0708 Last data filed at 05/14/2019 0400 Gross per 24 hour  Intake 556.28 ml  Output 400 ml  Net 156.28 ml   Filed Weights   05/13/19 1357  Weight: 56.2 kg    Telemetry    NSVT- Personally Reviewed  ECG    None this a.m.- Personally Reviewed  Physical Exam   GEN: No acute distress.   Cardiac: RRR, 3/6 systolic murmur in the second right intercostal space, no rubs, or gallops.  Respiratory: Clear to auscultation bilaterally. Skin: No evidence of hematoma at the right radial and right femoral access sites MS: No edema; No deformity. Neuro:  Nonfocal  Psych: Normal affect   Labs    Chemistry Recent Labs  Lab 05/13/19 0952 05/13/19 1554 05/13/19 1608 05/13/19 1610 05/13/19 1830  NA 133* 129* 129* 129*  --   K 4.0 4.3 4.1 4.2  --   CL 95*  --   --   --   --   CO2 26  --   --   --   --  GLUCOSE 131*  --   --   --   --   BUN 19  --   --   --   --   CREATININE 0.74  --   --   --  0.81  CALCIUM 9.4  --   --   --   --   PROT 7.3  --   --   --   --   ALBUMIN 3.9  --   --   --   --   AST 29  --   --   --   --   ALT 27  --   --   --   --   ALKPHOS 51  --   --   --   --   BILITOT 1.1  --   --   --   --   GFRNONAA >60  --   --   --  >60  GFRAA >60  --   --   --  >60  ANIONGAP 12  --   --   --   --      Hematology Recent Labs  Lab 05/13/19 0952  05/13/19 1608 05/13/19 1610 05/13/19 1830  WBC 6.8  --   --   --  8.8  RBC 4.63  --   --   --  4.53  HGB 13.8   < > 13.9 14.3 13.8  HCT 42.6   < > 41.0 42.0 40.7  MCV 92.0  --   --   --  89.8  MCH 29.8  --   --   --  30.5  MCHC 32.4  --   --   --  33.9  RDW 12.8  --   --   --  12.8  PLT 265  --   --   --  234   < > = values in this interval not displayed.     Cardiac EnzymesNo results for input(s): TROPONINI in the last 168 hours. No results for input(s): TROPIPOC in the last 168 hours.   BNP Recent Labs  Lab 05/13/19 1830  BNP 450.2*     DDimer No results for input(s): DDIMER in the last 168 hours.   Radiology    Ct Head Wo Contrast  Result Date: 05/13/2019 CLINICAL DATA:  Lightheaded.  Flush. EXAM: CT HEAD WITHOUT CONTRAST TECHNIQUE: Contiguous axial images were obtained from the base of the skull through the vertex without intravenous contrast. COMPARISON:  None. FINDINGS: Brain: No subdural, epidural, or subarachnoid hemorrhage. The cerebellum, brainstem, and basal cisterns are normal. No mass effect or midline shift. Ventricles and sulci are unremarkable. No acute cortical ischemia or infarct. Vascular: No hyperdense vessel or unexpected calcification. Skull: Normal. Negative for fracture or focal lesion. Sinuses/Orbits: No acute finding. Other: None. IMPRESSION: No acute intracranial abnormalities. Electronically Signed   By: Dorise Bullion III M.D   On: 05/13/2019 10:18   Dg Chest Port 1 View  Result Date: 05/13/2019 CLINICAL DATA:  Postprocedural film, heart cath EXAM: PORTABLE CHEST 1 VIEW COMPARISON:  03/07/2017, CT 05/13/2019 FINDINGS: Hyperinflation. No acute consolidation, pleural effusion or pneumothorax. Mild cardiomegaly with aortic atherosclerosis. IMPRESSION: No active disease. Mild cardiomegaly. Minimal scarring or atelectasis at the bases. Electronically Signed   By: Donavan Foil M.D.   On: 05/13/2019 18:56   Ct Angio Chest/abd/pel For Dissection W And/or W/wo  Result Date: 05/13/2019 CLINICAL DATA:  Lightheaded, flushed and disorientation. Weakness and chest pain and back pain. EXAM: CT ANGIOGRAPHY  CHEST, ABDOMEN AND PELVIS TECHNIQUE: Multidetector CT imaging through the chest, abdomen and pelvis was performed using the standard protocol during bolus administration of intravenous contrast. Multiplanar reconstructed images  and MIPs were obtained and reviewed to evaluate the vascular anatomy. CONTRAST:  117m OMNIPAQUE IOHEXOL 350 MG/ML SOLN COMPARISON:  CT abdomen/pelvis 11/24/2013 FINDINGS: CTA CHEST FINDINGS Cardiovascular: The heart is normal in size. No pericardial effusion. There is mild tortuosity, ectasia and atherosclerotic calcification involving the thoracic aorta but no dissection or focal aneurysm. The branch vessels are patent. Scattered three-vessel coronary artery calcifications are noted. The pulmonary arteries appear normal. No filling defects to suggest pulmonary embolism. Mediastinum/Nodes: No mediastinal or hilar mass or adenopathy. The esophagus is grossly normal. Lungs/Pleura: Patchy peripheral tree-in-bud findings involving both lungs suggesting chronic inflammation or atypical infection such as MAC. Some of these findings are chronic when compared to the lung bases from the prior CT scan of the abdomen from 2015. I do not see any definite emphysematous changes. There are some patchy areas of bronchiectasis which could be related to remote infection. Subpleural atelectasis or scarring change noted in the right middle lobe and lingula. No worrisome pulmonary lesions. No focal airspace consolidation. No pleural effusions. Musculoskeletal: No breast masses, supraclavicular or axillary adenopathy. Small scattered lymph nodes are noted. No significant bony findings. Review of the MIP images confirms the above findings. CTA ABDOMEN AND PELVIS FINDINGS VASCULAR Aorta: Moderate atherosclerotic calcifications most notably near the thoracoabdominal junction. No focal aneurysm or dissection. Celiac: Patent.  Some calcifications near the ostia. SMA: Patent. Renals: Moderate right and mild left proximal renal artery calcifications. Both renal arteries appears somewhat nodular and could not exclude component of FMD. IMA: Patent. Inflow: Moderate atherosclerotic calcifications. No significant stenosis, occlusion or aneurysm.  Veins: Grossly normal. Review of the MIP images confirms the above findings. NON-VASCULAR Hepatobiliary: No obvious hepatic lesions or intrahepatic biliary dilatation. The gallbladder appears normal. No common bile duct dilatation. Pancreas: No mass, inflammation or ductal dilatation. Spleen: Normal size.  No focal lesions. Adrenals/Urinary Tract: Adrenal glands and kidneys are grossly normal. No worrisome renal lesions or hydronephrosis. The bladder is unremarkable. Stomach/Bowel: The stomach, duodenum, small bowel and colon are grossly normal without oral contrast. No acute inflammatory changes, mass lesions or obstructive findings. Sigmoid colon diverticulosis but no findings for acute diverticulitis. Lymphatic: No abdominal or pelvic lymphadenopathy. Reproductive: The uterus and ovaries appear normal. Other: No pelvic mass or free pelvic fluid collections. No inguinal mass or adenopathy. Musculoskeletal: No significant bony findings. Moderate degenerative lumbar spondylosis with degenerative anterolisthesis of L4. No worrisome bone lesions. Review of the MIP images confirms the above findings. IMPRESSION: 1. Atherosclerotic changes involving the thoracic and abdominal aorta and branch vessels but no aneurysm or dissection or occlusion. 2. Normal appearance of the pulmonary arteries. No findings to suggest pulmonary embolism. 3. Chronic appearing inflammatory changes in the lungs with tree-in-bud appearance. Atypical infection such as MAC is also possible. 4. No worrisome pulmonary lesions or acute pneumonia. 5. No acute abdominal/pelvic findings, mass lesions or lymphadenopathy. Electronically Signed   By: PMarijo SanesM.D.   On: 05/13/2019 13:31    Cardiac Studies   05/13/2019-right and left heart cath    Prox RCA lesion is 50% stenosed.  There is moderate to severe left ventricular systolic dysfunction.  LV end diastolic pressure is moderately elevated.  The left ventricular ejection fraction is  35-45% by visual estimate.  There is trivial (1+) mitral regurgitation.  Hemodynamic findings consistent with moderate pulmonary  hypertension.   1. Nonobstructive CAD 2. Moderate to severe LV dysfunction. EF 35-40%. Wall motion abnormality classic for Takotsubo's cardiomyopathy 3. Moderately elevated LV filling pressures 4. Moderate pulmonary venous HTN 5. Reduced cardiac output. CI 2.3  Plan: patient was SOB in the lab. We administered IV Ntg and lasix in the lab and increased oxygen with improvement in her symptoms and improvement in PA sat from 62% to 66%. Will continue with IV diuresis, continue ACEi and Metoprolol  Patient Profile     Latina Frank is a 84 y.o. female with history of HTN, HLD, carotid disease and prior history of Takotsubo's disease presents in transfer from [place] with chest pain, diaphoresis and SOB found to have troponin elevation.  He is status post left heart cath which revealed nonobstructive CAD and moderate pulmonary hypertension  Assessment & Plan    #Takotsubo cardiomyopathy She is doing well this a.m and currently without symptoms.  She status post left heart cath which revealed 50% stenosis of proximal RCA otherwise nonobstructive CAD with moderate pulmonary hypertension.  Review of her images during cath does show apical dyskinesis very quite consistent with Takotsubo.  She reports of having a prior history of ataxia about 5 years ago but denies any recent stressful events though she does mention difficulty sleeping recently.  Noted to have LVEF of 35-40%.  Her EKG this a.m. review diffuse T wave inversion very well consistent with Takotsubo cardiomyopathy. -Continue aspirin, metoprolol, Crestor  #HFrEF secondary to above Echocardiogram reveals apical ballooning with akinesis and LVEF of 30-35%.  Also was found to have increased left ventricular hypertrophy.  Currently, she has signs of volume overload and will treat symptomatically.   #Asymptomatic severe aortic valve stenosis Echocardiogram reviewed severely thickening and severe calcification of aortic valve with aortic valve peak gradient of 44.7 mmHg with aortic valve area of 2.64 cm though the degree of stenosis was noted to be possibly underestimated due to underlying LV dysfunction.  Given that she is asymptomatic, we will optimize medical therapy with beta-blocker and diuresis if symptoms arise.    #Hypertension BP this a.m. in the 100s/50s. -Continue lisinopril, metoprolol  #Moderate pulmonary hypertension This was found on cardiac catheterization yesterday.  SPO2 this a.m. since admission, he has received IV Lasix with improvement to shortness of breath. -If SOB, will assess for volume overload and administer Lasix  #Hyperlipidemia -Continue Crestor  For questions or updates, please contact [place] Please consult [email or web address] for contact info under Cardiology/STEMI.      Signed, Jean Rosenthal, MD  05/14/2019, 7:08 AM

## 2019-05-14 NOTE — Progress Notes (Signed)
  Echocardiogram 2D Echocardiogram has been performed.  Matilde Bash 05/14/2019, 10:01 AM

## 2019-05-14 NOTE — Progress Notes (Signed)
1320-1440 Pt just walked with RN about 1230. Education completed with pt and daughter who voiced understanding. Pt has had takot subo before and has some knowledge about it. Discussed signs of CHF since EF low. Encouraged daily weights and 2LFR. Encouraged pt to take it easy to recover and gave walking instructions for ex. Discussed CRP 2 and will refer to [facility] for follow up. Pt had many questions which I answered. Encouraged pt to set up My Chart as she would like to be able to see labs and reports. Will follow up tomorrow and ambulate.  Daughter stated pt needs to relax more and pt to discuss with cardiologist if she can have something to help with this.Graylon Good RN BSN 05/14/2019 2:44 PM

## 2019-05-15 ENCOUNTER — Telehealth: Payer: Self-pay | Admitting: Cardiology

## 2019-05-15 ENCOUNTER — Encounter (HOSPITAL_COMMUNITY): Payer: Self-pay | Admitting: *Deleted

## 2019-05-15 LAB — BASIC METABOLIC PANEL
Anion gap: 16 — ABNORMAL HIGH (ref 5–15)
BUN: 32 mg/dL — ABNORMAL HIGH (ref 8–23)
CO2: 22 mmol/L (ref 22–32)
Calcium: 9.2 mg/dL (ref 8.9–10.3)
Chloride: 90 mmol/L — ABNORMAL LOW (ref 98–111)
Creatinine, Ser: 1.46 mg/dL — ABNORMAL HIGH (ref 0.44–1.00)
GFR calc Af Amer: 38 mL/min — ABNORMAL LOW (ref >60)
GFR calc non Af Amer: 33 mL/min — ABNORMAL LOW (ref >60)
Glucose, Bld: 119 mg/dL — ABNORMAL HIGH (ref 70–99)
Potassium: 3.6 mmol/L (ref 3.5–5.1)
Sodium: 128 mmol/L — ABNORMAL LOW (ref 135–145)

## 2019-05-15 LAB — CBC
HCT: 39.4 % (ref 36.0–46.0)
Hemoglobin: 13.7 g/dL (ref 12.0–15.0)
MCH: 30.9 pg (ref 26.0–34.0)
MCHC: 34.8 g/dL (ref 30.0–36.0)
MCV: 88.9 fL (ref 80.0–100.0)
Platelets: 259 10*3/uL (ref 150–400)
RBC: 4.43 MIL/uL (ref 3.87–5.11)
RDW: 12.6 % (ref 11.5–15.5)
WBC: 9.5 10*3/uL (ref 4.0–10.5)
nRBC: 0 % (ref 0.0–0.2)

## 2019-05-15 MED ORDER — DOCUSATE SODIUM 100 MG PO CAPS
100.0000 mg | ORAL_CAPSULE | Freq: Every day | ORAL | Status: DC
  Administered 2019-05-15: 100 mg via ORAL
  Filled 2019-05-15: qty 1

## 2019-05-15 MED ORDER — SODIUM CHLORIDE 0.9 % IV SOLN: INTRAVENOUS | Status: DC

## 2019-05-15 MED ORDER — LISINOPRIL 10 MG PO TABS: 10.0000 mg | ORAL_TABLET | Freq: Every day | ORAL | 1 refills | Status: DC

## 2019-05-15 MED ORDER — GLYCERIN (LAXATIVE) 2.1 G RE SUPP
1.0000 | RECTAL | Status: DC | PRN
  Administered 2019-05-15: 1 via RECTAL
  Filled 2019-05-15 (×2): qty 1

## 2019-05-15 MED ORDER — LISINOPRIL 10 MG PO TABS: 10.0000 mg | ORAL_TABLET | Freq: Every day | ORAL | Status: DC

## 2019-05-15 NOTE — Telephone Encounter (Signed)
New Message    [facility] scheduled for 05/28/19 @ 8:30am with Kerin Ransom PA

## 2019-05-15 NOTE — Progress Notes (Signed)
CARDIAC REHAB PHASE I   PRE:  Rate/Rhythm: 95 SR  BP:  Supine: 95/78  Sitting:   Standing:    SaO2: 93%RA  MODE:  Ambulation: 470 ft   POST:  Rate/Rhythm: 112 ST  BP:  Supine:   Sitting: 120/77  Standing:    SaO2: 97%RA [id number] Pt walked 470 ft on RA with hand held asst with steady gait and tolerated well. To recliner with call bell after walk. Wants to go home.   Graylon Good, RN BSN  05/15/2019 8:27 AM

## 2019-05-15 NOTE — Discharge Summary (Addendum)
The patient has been seen in conjunction with Reino Bellis, NP. All aspects of care have been considered and discussed. The patient has been personally interviewed, examined, and all clinical data has been reviewed.   Please see note from earlier today.   Discharge Summary    Patient ID: Barbara Mcconnell,  MRN: [id number], DOB/AGE: [birth year] 82 y.o.  Admit date: 05/13/2019 Discharge date: 05/15/2019  Primary Care Provider: Kristopher Glee. Primary Cardiologist: Peter Martinique, MD  Discharge Diagnoses    Principal Problem:   Takotsubo cardiomyopathy Active Problems:   Non-ST elevation (NSTEMI) myocardial infarction ([place])   HTN (hypertension)   Hypercholesterolemia   Carotid arterial disease (HCC)   Allergies Allergies  Allergen Reactions  . Codeine Nausea And Vomiting and Nausea Only  . Prednisone Other (See Comments)    Sores in mouth    Diagnostic Studies/Procedures    Cath: 05/13/19   Prox RCA lesion is 50% stenosed.  There is moderate to severe left ventricular systolic dysfunction.  LV end diastolic pressure is moderately elevated.  The left ventricular ejection fraction is 35-45% by visual estimate.  There is trivial (1+) mitral regurgitation.  Hemodynamic findings consistent with moderate pulmonary hypertension.   1. Nonobstructive CAD 2. Moderate to severe LV dysfunction. EF 35-40%. Wall motion abnormality classic for Takotsubo's cardiomyopathy 3. Moderately elevated LV filling pressures 4. Moderate pulmonary venous HTN 5. Reduced cardiac output. CI 2.3  Plan: patient was SOB in the lab. We administered IV Ntg and lasix in the lab and increased oxygen with improvement in her symptoms and improvement in PA sat from 62% to 66%. Will continue with IV diuresis, continue ACEi and Metoprolol. Echo.   Diagnostic Dominance: Right  TTE: 05/14/19  IMPRESSIONS    1. There is apical ballooning with akinesis of the mid and apical lateral, mid  inferoseptal, apical septal, apical, mid and apical anterior, apical inferior, mid anteroseptal and apical inferolateral walls. Left ventricular ejection fraction, by visual  estimation, is 30 to 35%. There is severely increased left ventricular hypertrophy. The left ventricular hypertrophy involves basal-septum walls. Findings consistent with stress induced CM.  2. Left ventricular diastolic Doppler parameters are consistent with impaired relaxation pattern of LV diastolic filling.  3. Global right ventricle has normal systolic function.The right ventricular size is normal. No increase in right ventricular wall thickness.  4. Left atrial size was normal.  5. Right atrial size was normal.  6. Moderate mitral annular calcification.  7. The mitral valve is normal in structure. Mild mitral valve regurgitation. No evidence of mitral stenosis.  8. The tricuspid valve is normal in structure. Tricuspid valve regurgitation is mild.  9. The aortic valve is tricuspid. There is Severely thickening of the aortic valve. There is Severe calcifcation of the aortic valve. Aortic valve regurgitation was not visualized by color flow Doppler. Moderate aortic valve stenosis. The degree of  aortic stenosis may be underestimated due to underlying LV dysfunction 10. The pulmonic valve was normal in structure. Pulmonic valve regurgitation is trivial by color flow Doppler. _____________   History of Present Illness     Barbara Mcconnell is a pleasant 84 yo WF with history of HTN, HLD carotid disease and hx of Takotsubo who presented with chest pain. Reported hospitalization 5 years ago. Was told she had Takotsubo cardiomyopathy. Had cardiac cath with some blockage noted. Was treated medically and apparently Echo showed good recovery. The morning of admission she was at home when she developed SS chest tightness like indigestion.  She became very weak and diaphoretic. She went to [place]. Ecg was not diagnostic. Showed  LVH. While in ED she went to [facility] and again developed symptoms of diaphoresis and became severely hypotensive with BP 70 systolic. Symptoms resolved. She has had intermittent chest pain since then relieved with MSO4. On transfer to [facility] she became acutely SOB just transferring from stretcher. Found to have a rising troponin, and decision made to take for cardiac cath.   Hospital Course     She went cardiac cath which revealed 50% stenosis of proximal RCA otherwise nonobstructive CAD with moderate pulmonary hypertension. LV gram showed apical dyskinesis very quite consistent with Takotsubo. Denied any recent stressful events though she did mention difficulty sleeping recently.  Noted to have LVEF of 35-40% on follow up echo. Also noted moderate AS with dynamic LVOF. Her follow up EKG showed diffuse T wave inversion consistent with Takotsubo cardiomyopathy. She was started on low dose metoprolol. Blood pressures were soft but able to add back lisinopril 82m (on 230mPTA) with blood pressure tolerating. No signs of volume overload during admission. Seen by cardiac rehab and educated. She was instructed on the signs/symptoms of worsening CHF and told to call if she develops. No diuretic at the time of discharge. Able to ambulate without difficulty or recurrent chest pain.   MaIsyss Espinalas seen by Dr. SmTamala Juliannd determined stable for discharge home. Follow up in the office has been arranged. Medications are listed below.   _____________  Discharge Vitals Blood pressure 104/63, pulse 78, temperature 98 F (36.7 C), temperature source Oral, resp. rate (!) 24, height _0  (1.549 m), weight 54.4 kg, SpO2 95 %.  Filed Weights   05/13/19 1357 05/14/19 0500 05/15/19 0610  Weight: 56.2 kg 56.5 kg 54.4 kg    Labs & Radiologic Studies    CBC Recent Labs    05/13/19 0952  05/14/19 1910 05/15/19 1215  WBC 6.8   < > 9.5 9.5  NEUTROABS 3.0  --   --   --   HGB 13.8   < > 13.2 13.7  HCT 42.6   < > 38.7 39.4   MCV 92.0   < > 89.4 88.9  PLT 265   < > 248 259   < > = values in this interval not displayed.   Basic Metabolic Panel Recent Labs    05/14/19 1910 05/15/19 1215  NA 128* 128*  K 3.9 3.6  CL 92* 90*  CO2 22 22  GLUCOSE 155* 119*  BUN 21 32*  CREATININE 1.21* 1.46*  CALCIUM 9.2 9.2   Liver Function Tests Recent Labs    05/13/19 0952  AST 29  ALT 27  ALKPHOS 51  BILITOT 1.1  PROT 7.3  ALBUMIN 3.9   No results for input(s): LIPASE, AMYLASE in the last 72 hours. Cardiac Enzymes No results for input(s): CKTOTAL, CKMB, CKMBINDEX, TROPONINI in the last 72 hours. BNP Invalid input(s): POCBNP D-Dimer No results for input(s): DDIMER in the last 72 hours. Hemoglobin A1C No results for input(s): HGBA1C in the last 72 hours. Fasting Lipid Panel No results for input(s): CHOL, HDL, LDLCALC, TRIG, CHOLHDL, LDLDIRECT in the last 72 hours. Thyroid Function Tests Recent Labs    05/13/19 1830  TSH 1.406   _____________  Ct Head Wo Contrast  Result Date: 05/13/2019 CLINICAL DATA:  Lightheaded.  Flush. EXAM: CT HEAD WITHOUT CONTRAST TECHNIQUE: Contiguous axial images were obtained from the base of the skull through the  vertex without intravenous contrast. COMPARISON:  None. FINDINGS: Brain: No subdural, epidural, or subarachnoid hemorrhage. The cerebellum, brainstem, and basal cisterns are normal. No mass effect or midline shift. Ventricles and sulci are unremarkable. No acute cortical ischemia or infarct. Vascular: No hyperdense vessel or unexpected calcification. Skull: Normal. Negative for fracture or focal lesion. Sinuses/Orbits: No acute finding. Other: None. IMPRESSION: No acute intracranial abnormalities. Electronically Signed   By: Dorise Bullion III M.D   On: 05/13/2019 10:18   Dg Chest Port 1 View  Result Date: 05/13/2019 CLINICAL DATA:  Postprocedural film, heart cath EXAM: PORTABLE CHEST 1 VIEW COMPARISON:  03/07/2017, CT 05/13/2019 FINDINGS: Hyperinflation. No acute  consolidation, pleural effusion or pneumothorax. Mild cardiomegaly with aortic atherosclerosis. IMPRESSION: No active disease. Mild cardiomegaly. Minimal scarring or atelectasis at the bases. Electronically Signed   By: Donavan Foil M.D.   On: 05/13/2019 18:56   Ct Angio Chest/abd/pel For Dissection W And/or W/wo  Result Date: 05/13/2019 CLINICAL DATA:  Lightheaded, flushed and disorientation. Weakness and chest pain and back pain. EXAM: CT ANGIOGRAPHY CHEST, ABDOMEN AND PELVIS TECHNIQUE: Multidetector CT imaging through the chest, abdomen and pelvis was performed using the standard protocol during bolus administration of intravenous contrast. Multiplanar reconstructed images and MIPs were obtained and reviewed to evaluate the vascular anatomy. CONTRAST:  137m OMNIPAQUE IOHEXOL 350 MG/ML SOLN COMPARISON:  CT abdomen/pelvis 11/24/2013 FINDINGS: CTA CHEST FINDINGS Cardiovascular: The heart is normal in size. No pericardial effusion. There is mild tortuosity, ectasia and atherosclerotic calcification involving the thoracic aorta but no dissection or focal aneurysm. The branch vessels are patent. Scattered three-vessel coronary artery calcifications are noted. The pulmonary arteries appear normal. No filling defects to suggest pulmonary embolism. Mediastinum/Nodes: No mediastinal or hilar mass or adenopathy. The esophagus is grossly normal. Lungs/Pleura: Patchy peripheral tree-in-bud findings involving both lungs suggesting chronic inflammation or atypical infection such as MAC. Some of these findings are chronic when compared to the lung bases from the prior CT scan of the abdomen from 2015. I do not see any definite emphysematous changes. There are some patchy areas of bronchiectasis which could be related to remote infection. Subpleural atelectasis or scarring change noted in the right middle lobe and lingula. No worrisome pulmonary lesions. No focal airspace consolidation. No pleural effusions.  Musculoskeletal: No breast masses, supraclavicular or axillary adenopathy. Small scattered lymph nodes are noted. No significant bony findings. Review of the MIP images confirms the above findings. CTA ABDOMEN AND PELVIS FINDINGS VASCULAR Aorta: Moderate atherosclerotic calcifications most notably near the thoracoabdominal junction. No focal aneurysm or dissection. Celiac: Patent.  Some calcifications near the ostia. SMA: Patent. Renals: Moderate right and mild left proximal renal artery calcifications. Both renal arteries appears somewhat nodular and could not exclude component of FMD. IMA: Patent. Inflow: Moderate atherosclerotic calcifications. No significant stenosis, occlusion or aneurysm. Veins: Grossly normal. Review of the MIP images confirms the above findings. NON-VASCULAR Hepatobiliary: No obvious hepatic lesions or intrahepatic biliary dilatation. The gallbladder appears normal. No common bile duct dilatation. Pancreas: No mass, inflammation or ductal dilatation. Spleen: Normal size.  No focal lesions. Adrenals/Urinary Tract: Adrenal glands and kidneys are grossly normal. No worrisome renal lesions or hydronephrosis. The bladder is unremarkable. Stomach/Bowel: The stomach, duodenum, small bowel and colon are grossly normal without oral contrast. No acute inflammatory changes, mass lesions or obstructive findings. Sigmoid colon diverticulosis but no findings for acute diverticulitis. Lymphatic: No abdominal or pelvic lymphadenopathy. Reproductive: The uterus and ovaries appear normal. Other: No pelvic mass or free pelvic fluid  collections. No inguinal mass or adenopathy. Musculoskeletal: No significant bony findings. Moderate degenerative lumbar spondylosis with degenerative anterolisthesis of L4. No worrisome bone lesions. Review of the MIP images confirms the above findings. IMPRESSION: 1. Atherosclerotic changes involving the thoracic and abdominal aorta and branch vessels but no aneurysm or dissection  or occlusion. 2. Normal appearance of the pulmonary arteries. No findings to suggest pulmonary embolism. 3. Chronic appearing inflammatory changes in the lungs with tree-in-bud appearance. Atypical infection such as MAC is also possible. 4. No worrisome pulmonary lesions or acute pneumonia. 5. No acute abdominal/pelvic findings, mass lesions or lymphadenopathy. Electronically Signed   By: Marijo Sanes M.D.   On: 05/13/2019 13:31   Disposition   Pt is being discharged home today in good condition.  Follow-up Plans & Appointments    Follow-up Information    Erlene Quan, PA-C Follow up on 05/28/2019.   Specialties: Cardiology, Radiology Why: at 8:30am for your follow up appt.  Contact information: [address] [place] [address] [phone]          Discharge Instructions    (HEART FAILURE PATIENTS) Call MD:  Anytime you have any of the following symptoms: 1) 3 pound weight gain in 24 hours or 5 pounds in 1 week 2) shortness of breath, with or without a dry hacking cough 3) swelling in the hands, feet or stomach 4) if you have to sleep on extra pillows at night in order to breathe.   Complete by: As directed    Amb Referral to Cardiac Rehabilitation   Complete by: As directed    Referring to [place] CRP 2   Diagnosis: NSTEMI   After initial evaluation and assessments completed: Virtual Based Care may be provided alone or in conjunction with Phase 2 Cardiac Rehab based on patient barriers.: Yes   Diet - low sodium heart healthy   Complete by: As directed    Discharge instructions   Complete by: As directed    Radial Site Care Refer to this sheet in the next few weeks. These instructions provide you with information on caring for yourself after your procedure. Your caregiver may also give you more specific instructions. Your treatment has been planned according to current medical practices, but problems sometimes occur. Call your caregiver if you have any  problems or questions after your procedure. HOME CARE INSTRUCTIONS You may shower the day after the procedure.Remove the bandage (dressing) and gently wash the site with plain soap and water.Gently pat the site dry.  Do not apply powder or lotion to the site.  Do not submerge the affected site in water for 3 to 5 days.  Inspect the site at least twice daily.  Do not flex or bend the affected arm for 24 hours.  No lifting over 5 pounds (2.3 kg) for 5 days after your procedure.  What to expect: Any bruising will usually fade within 1 to 2 weeks.  Blood that collects in the tissue (hematoma) may be painful to the touch. It should usually decrease in size and tenderness within 1 to 2 weeks.  SEEK IMMEDIATE MEDICAL CARE IF: You have unusual pain at the radial site.  You have redness, warmth, swelling, or pain at the radial site.  You have drainage (other than a small amount of blood on the dressing).  You have chills.  You have a fever or persistent symptoms for more than 72 hours.  You have a fever and your symptoms suddenly get worse.  Your arm becomes pale, cool, tingly, or numb.  You have heavy bleeding from the site. Hold pressure on the site.   For patients with congestive heart failure, we give them these special instructions:  1. Follow a low-salt diet and watch your fluid intake. In general, you should not be taking in more than 2 liters of fluid per day (no more than 8 glasses per day). Some patients are restricted to less than 1.5 liters of fluid per day (no more than 6 glasses per day). This includes sources of water in foods like soup, coffee, tea, milk, etc. 2. Weigh yourself on the same scale at same time of day and keep a log. 3. Call your doctor: (Anytime you feel any of the following symptoms)  - 3-4 pound weight gain in 1-2 days or 2 pounds overnight  - Shortness of breath, with or without a dry hacking cough  - Swelling in the hands, feet or stomach  - If you have to  sleep on extra pillows at night in order to breathe   IT IS IMPORTANT TO LET YOUR DOCTOR KNOW EARLY ON IF YOU ARE HAVING SYMPTOMS SO WE CAN HELP YOU!   Increase activity slowly   Complete by: As directed       Discharge Medications     Medication List    STOP taking these medications   amoxicillin 875 MG tablet Commonly known as: AMOXIL     TAKE these medications   albuterol 108 (90 Base) MCG/ACT inhaler Commonly known as: VENTOLIN HFA Inhale 2 puffs into the lungs every 4 (four) hours as needed for wheezing or shortness of breath (seasonal allergies).   ASPERCREME EX Apply 1 application topically daily as needed (knee pain).   aspirin EC 81 MG tablet Take 81 mg by mouth See admin instructions. Take one tablet (81 mg) by mouth twice weekly - on Sunday and Thursday   cetirizine 10 MG tablet Commonly known as: ZYRTEC Take 10 mg by mouth daily with breakfast.   docusate sodium 100 MG capsule Commonly known as: COLACE Take 100 mg by mouth daily with breakfast.   Emergen-C Vitamin C Pack Take 1 packet by mouth daily with lunch.   FISH OIL PO Take 1 capsule by mouth daily.   lisinopril 10 MG tablet Commonly known as: ZESTRIL Take 1 tablet (10 mg total) by mouth at bedtime. What changed:   medication strength  how much to take  when to take this   metoprolol succinate 25 MG 24 hr tablet Commonly known as: TOPROL-XL Take 25 mg by mouth daily with breakfast.   montelukast 10 MG tablet Commonly known as: SINGULAIR Take 10 mg by mouth daily after supper.   multivitamin with minerals Tabs tablet Take 1 tablet by mouth daily. Centrum 50 plus   omeprazole 20 MG capsule Commonly known as: PRILOSEC Take 20 mg by mouth daily before breakfast.   rosuvastatin 5 MG tablet Commonly known as: CRESTOR Take 5 mg by mouth daily after supper.   SALONPAS EX Place 1 patch onto the skin daily as needed (knee pain/ shoulder pain).   VITAMIN D3 PO Take 1 tablet by mouth  daily.        Outstanding Labs/Studies   BMET at follow up appt.  Duration of Discharge Encounter   Greater than 30 minutes including physician time.  Signed, Reino Bellis NP-C 05/15/2019, 2:14 PM

## 2019-05-15 NOTE — Progress Notes (Addendum)
The patient has been seen in conjunction with Nathanial Rancher, MD. All aspects of care have been considered and discussed. The patient has been personally interviewed, examined, and all clinical data has been reviewed.   Stress cardiomyopathy, recurrent and clinically stable.  Continue beta blocker anf lisinopril (decreased dose to 10 mg daily from 20 mg due to soft BP).  F/u as transition of care with Dr. Doug Sou team 7 days max.  Specifically requested a call from patient if any shortness of breath.  Cath sites are unremarkable.  Discharge today.  Progress Note  Patient Name: Barbara Mcconnell Date of Encounter: 05/15/2019  Primary Cardiologist: No primary care provider on file.   Subjective   She is progressing very well and currently denies chest pain, shortness of breath.  She has been able to ambulate without any difficulties.  Inpatient Medications    Scheduled Meds:  aspirin EC  81 mg Oral Daily   cetirizine  10 mg Oral Daily   Chlorhexidine Gluconate Cloth  6 each Topical Daily   enoxaparin (LOVENOX) injection  30 mg Subcutaneous Q24H   furosemide  40 mg Intravenous Q12H   lisinopril  20 mg Oral QHS   metoprolol succinate  25 mg Oral Daily   multivitamin with minerals  1 tablet Oral Daily   omega-3 acid ethyl esters  1 g Oral Daily   rosuvastatin  5 mg Oral Daily   sodium chloride flush  3 mL Intravenous Q12H   sodium chloride flush  3 mL Intravenous Q12H   Continuous Infusions:  sodium chloride     sodium chloride     PRN Meds: sodium chloride, sodium chloride, acetaminophen, ALPRAZolam, ondansetron (ZOFRAN) IV, sodium chloride flush, sodium chloride flush   Vital Signs    Vitals:   05/15/19 0400 05/15/19 0430 05/15/19 0525 05/15/19 0610  BP: (!) 80/50 (!) 90/51 (!) 88/75   Pulse: 80 80 72   Resp: (!) 25 (!) 24 (!) 24   Temp:      TempSrc:      SpO2: 91% 94% 91%   Weight:    54.4 kg  Height:        Intake/Output Summary (Last 24 hours)  at 05/15/2019 0649 Last data filed at 05/14/2019 2200 Gross per 24 hour  Intake 180 ml  Output 1725 ml  Net -1545 ml   Filed Weights   05/13/19 1357 05/14/19 0500 05/15/19 0610  Weight: 56.2 kg 56.5 kg 54.4 kg    Telemetry    No evidence of V. Fib, V. tach, SVT- Personally Reviewed  ECG    None today- Personally Reviewed  Physical Exam   GEN: No acute distress.   Cardiac: RRR, no murmurs, rubs, or gallops.  Respiratory: Clear to auscultation bilaterally. MS: No edema; No deformity. Neuro:  Nonfocal  Psych: Normal affect   Labs    Chemistry Recent Labs  Lab 05/13/19 0952  05/13/19 1608 05/13/19 1610 05/13/19 1830 05/14/19 1910  NA 133*   < > 129* 129*  --  128*  K 4.0   < > 4.1 4.2  --  3.9  CL 95*  --   --   --   --  92*  CO2 26  --   --   --   --  22  GLUCOSE 131*  --   --   --   --  155*  BUN 19  --   --   --   --  21  CREATININE 0.74  --   --   --  0.81 1.21*  CALCIUM 9.4  --   --   --   --  9.2  PROT 7.3  --   --   --   --   --   ALBUMIN 3.9  --   --   --   --   --   AST 29  --   --   --   --   --   ALT 27  --   --   --   --   --   ALKPHOS 51  --   --   --   --   --   BILITOT 1.1  --   --   --   --   --   GFRNONAA >60  --   --   --  >60 42*  GFRAA >60  --   --   --  >60 48*  ANIONGAP 12  --   --   --   --  14   < > = values in this interval not displayed.     Hematology Recent Labs  Lab 05/13/19 0952  05/13/19 1610 05/13/19 1830 05/14/19 1910  WBC 6.8  --   --  8.8 9.5  RBC 4.63  --   --  4.53 4.33  HGB 13.8   < > 14.3 13.8 13.2  HCT 42.6   < > 42.0 40.7 38.7  MCV 92.0  --   --  89.8 89.4  MCH 29.8  --   --  30.5 30.5  MCHC 32.4  --   --  33.9 34.1  RDW 12.8  --   --  12.8 12.7  PLT 265  --   --  234 248   < > = values in this interval not displayed.    Cardiac EnzymesNo results for input(s): TROPONINI in the last 168 hours. No results for input(s): TROPIPOC in the last 168 hours.   BNP Recent Labs  Lab 05/13/19 1830  BNP 450.2*       DDimer No results for input(s): DDIMER in the last 168 hours.   Radiology    Ct Head Wo Contrast  Result Date: 05/13/2019 CLINICAL DATA:  Lightheaded.  Flush. EXAM: CT HEAD WITHOUT CONTRAST TECHNIQUE: Contiguous axial images were obtained from the base of the skull through the vertex without intravenous contrast. COMPARISON:  None. FINDINGS: Brain: No subdural, epidural, or subarachnoid hemorrhage. The cerebellum, brainstem, and basal cisterns are normal. No mass effect or midline shift. Ventricles and sulci are unremarkable. No acute cortical ischemia or infarct. Vascular: No hyperdense vessel or unexpected calcification. Skull: Normal. Negative for fracture or focal lesion. Sinuses/Orbits: No acute finding. Other: None. IMPRESSION: No acute intracranial abnormalities. Electronically Signed   By: Dorise Bullion III M.D   On: 05/13/2019 10:18   Dg Chest Port 1 View  Result Date: 05/13/2019 CLINICAL DATA:  Postprocedural film, heart cath EXAM: PORTABLE CHEST 1 VIEW COMPARISON:  03/07/2017, CT 05/13/2019 FINDINGS: Hyperinflation. No acute consolidation, pleural effusion or pneumothorax. Mild cardiomegaly with aortic atherosclerosis. IMPRESSION: No active disease. Mild cardiomegaly. Minimal scarring or atelectasis at the bases. Electronically Signed   By: Donavan Foil M.D.   On: 05/13/2019 18:56   Ct Angio Chest/abd/pel For Dissection W And/or W/wo  Result Date: 05/13/2019 CLINICAL DATA:  Lightheaded, flushed and disorientation. Weakness and chest pain and back pain. EXAM: CT ANGIOGRAPHY CHEST, ABDOMEN AND PELVIS TECHNIQUE: Multidetector CT imaging through the chest, abdomen and pelvis was performed using the standard protocol  during bolus administration of intravenous contrast. Multiplanar reconstructed images and MIPs were obtained and reviewed to evaluate the vascular anatomy. CONTRAST:  128m OMNIPAQUE IOHEXOL 350 MG/ML SOLN COMPARISON:  CT abdomen/pelvis 11/24/2013 FINDINGS: CTA CHEST  FINDINGS Cardiovascular: The heart is normal in size. No pericardial effusion. There is mild tortuosity, ectasia and atherosclerotic calcification involving the thoracic aorta but no dissection or focal aneurysm. The branch vessels are patent. Scattered three-vessel coronary artery calcifications are noted. The pulmonary arteries appear normal. No filling defects to suggest pulmonary embolism. Mediastinum/Nodes: No mediastinal or hilar mass or adenopathy. The esophagus is grossly normal. Lungs/Pleura: Patchy peripheral tree-in-bud findings involving both lungs suggesting chronic inflammation or atypical infection such as MAC. Some of these findings are chronic when compared to the lung bases from the prior CT scan of the abdomen from 2015. I do not see any definite emphysematous changes. There are some patchy areas of bronchiectasis which could be related to remote infection. Subpleural atelectasis or scarring change noted in the right middle lobe and lingula. No worrisome pulmonary lesions. No focal airspace consolidation. No pleural effusions. Musculoskeletal: No breast masses, supraclavicular or axillary adenopathy. Small scattered lymph nodes are noted. No significant bony findings. Review of the MIP images confirms the above findings. CTA ABDOMEN AND PELVIS FINDINGS VASCULAR Aorta: Moderate atherosclerotic calcifications most notably near the thoracoabdominal junction. No focal aneurysm or dissection. Celiac: Patent.  Some calcifications near the ostia. SMA: Patent. Renals: Moderate right and mild left proximal renal artery calcifications. Both renal arteries appears somewhat nodular and could not exclude component of FMD. IMA: Patent. Inflow: Moderate atherosclerotic calcifications. No significant stenosis, occlusion or aneurysm. Veins: Grossly normal. Review of the MIP images confirms the above findings. NON-VASCULAR Hepatobiliary: No obvious hepatic lesions or intrahepatic biliary dilatation. The gallbladder  appears normal. No common bile duct dilatation. Pancreas: No mass, inflammation or ductal dilatation. Spleen: Normal size.  No focal lesions. Adrenals/Urinary Tract: Adrenal glands and kidneys are grossly normal. No worrisome renal lesions or hydronephrosis. The bladder is unremarkable. Stomach/Bowel: The stomach, duodenum, small bowel and colon are grossly normal without oral contrast. No acute inflammatory changes, mass lesions or obstructive findings. Sigmoid colon diverticulosis but no findings for acute diverticulitis. Lymphatic: No abdominal or pelvic lymphadenopathy. Reproductive: The uterus and ovaries appear normal. Other: No pelvic mass or free pelvic fluid collections. No inguinal mass or adenopathy. Musculoskeletal: No significant bony findings. Moderate degenerative lumbar spondylosis with degenerative anterolisthesis of L4. No worrisome bone lesions. Review of the MIP images confirms the above findings. IMPRESSION: 1. Atherosclerotic changes involving the thoracic and abdominal aorta and branch vessels but no aneurysm or dissection or occlusion. 2. Normal appearance of the pulmonary arteries. No findings to suggest pulmonary embolism. 3. Chronic appearing inflammatory changes in the lungs with tree-in-bud appearance. Atypical infection such as MAC is also possible. 4. No worrisome pulmonary lesions or acute pneumonia. 5. No acute abdominal/pelvic findings, mass lesions or lymphadenopathy. Electronically Signed   By: PMarijo SanesM.D.   On: 05/13/2019 13:31    Cardiac Studies   05/13/2019-right and left heart cath    Prox RCA lesion is 50% stenosed.  There is moderate to severe left ventricular systolic dysfunction.  LV end diastolic pressure is moderately elevated.  The left ventricular ejection fraction is 35-45% by visual estimate.  There is trivial (1+) mitral regurgitation.  Hemodynamic findings consistent with moderate pulmonary hypertension.  1. Nonobstructive CAD 2.  Moderate to severe LV dysfunction. EF 35-40%. Wall motion abnormality classic for Takotsubo's cardiomyopathy  3. Moderately elevated LV filling pressures 4. Moderate pulmonary venous HTN 5. Reduced cardiac output. CI 2.3    05/14/2019-echocardiogram  1. There is apical ballooning with akinesis of the mid and apical lateral, mid inferoseptal, apical septal, apical, mid and apical anterior, apical inferior, mid anteroseptal and apical inferolateral walls. Left ventricular ejection fraction, by visual  estimation, is 30 to 35%. There is severely increased left ventricular hypertrophy. The left ventricular hypertrophy involves basal-septum walls. Findings consistent with stress induced CM.  2. Left ventricular diastolic Doppler parameters are consistent with impaired relaxation pattern of LV diastolic filling.  3. Global right ventricle has normal systolic function.The right ventricular size is normal. No increase in right ventricular wall thickness.  4. Left atrial size was normal.  5. Right atrial size was normal.  6. Moderate mitral annular calcification.  7. The mitral valve is normal in structure. Mild mitral valve regurgitation. No evidence of mitral stenosis.  8. The tricuspid valve is normal in structure. Tricuspid valve regurgitation is mild.  9. The aortic valve is tricuspid. There is Severely thickening of the aortic valve. There is Severe calcifcation of the aortic valve. Aortic valve regurgitation was not visualized by color flow Doppler. Moderate aortic valve stenosis. The degree of  aortic stenosis may be underestimated due to underlying LV dysfunction 10. The pulmonic valve was normal in structure. Pulmonic valve regurgitation is trivial by color flow Doppler.   Patient Profile     Alisia Vanengen a 84 y.o.femalewith history of HTN, HLD, carotid disease and prior history of Takotsubo's disease presents in transfer from [place] with chest pain, diaphoresis and SOB  found to have troponin elevation.  He is status post left heart cath which revealed nonobstructive CAD and moderate pulmonary hypertension  Assessment & Plan    #Takotsubo cardiomyopathy She is doing very well this morning and has been able to ambulate without any difficulties. She is very well stable to discharge home today to follow up with cardiology -Continue aspirin, metoprolol, lisinopril, Crestor  #HFrEF secondary to above Echocardiogram reveals apical ballooning with akinesis and LVEF of 30-35%.   She currently has no sign of volume overload however would have to be monitored closely for heart failure.  She has an appointment set up to follow-up with cardiology.  #Moderate aortic stenosis with LVOF She remains asymptomatic without any signs of dyspnea on exertion.  Going forward, will have to be careful with diuresis as LVOF is preload dependent.  #Hypertension BP this a.m. 108/57. Given soft BP, will decrease dose of lisinopril to 66m -Continue lisinopril, metoprolol  #Acute kidney injury-prerenal Serum creatinine yesterday mildly elevated at 1.2 from 0.8 -Strict intake and output -Lasix has been discontinued  #Hyperlipidemia -Continue Crestor  For questions or updates, please contact [facility] Please consult [email or web address] for contact info under Cardiology/STEMI.      Signed, OJean Rosenthal MD  05/15/2019, 6:49 AM

## 2019-05-16 NOTE — Telephone Encounter (Signed)
Patient contacted regarding discharge from [facility] on 05/15/2019.  Patient understands to follow up with provider Kerin Ransom, [place] on 11/6 at 1:30 PM at [facility] office. Patient understands discharge instructions? Yes Patient understands medications and regiment? Yes Patient understands to bring all medications to this visit? Yes  Patient requested to move appointment to a different time- moved appointment from 11/3 to 11/6 at 1:30. Patient verbalized understanding.

## 2019-05-28 ENCOUNTER — Ambulatory Visit: Payer: Medicare HMO | Admitting: Cardiology

## 2019-05-31 ENCOUNTER — Ambulatory Visit: Payer: Medicare HMO | Admitting: Cardiology

## 2019-05-31 ENCOUNTER — Other Ambulatory Visit: Payer: Self-pay

## 2019-05-31 VITALS — BP 152/82 | HR 81 | Temp 97.6°F | Ht 61.0 in | Wt 123.0 lb

## 2019-05-31 DIAGNOSIS — J439 Emphysema, unspecified: Secondary | ICD-10-CM

## 2019-05-31 DIAGNOSIS — I6523 Occlusion and stenosis of bilateral carotid arteries: Secondary | ICD-10-CM

## 2019-05-31 DIAGNOSIS — N289 Disorder of kidney and ureter, unspecified: Secondary | ICD-10-CM

## 2019-05-31 DIAGNOSIS — I10 Essential (primary) hypertension: Secondary | ICD-10-CM | POA: Diagnosis not present

## 2019-05-31 DIAGNOSIS — I5181 Takotsubo syndrome: Secondary | ICD-10-CM | POA: Diagnosis not present

## 2019-05-31 DIAGNOSIS — I214 Non-ST elevation (NSTEMI) myocardial infarction: Secondary | ICD-10-CM | POA: Diagnosis not present

## 2019-05-31 DIAGNOSIS — I2510 Atherosclerotic heart disease of native coronary artery without angina pectoris: Secondary | ICD-10-CM

## 2019-05-31 DIAGNOSIS — I350 Nonrheumatic aortic (valve) stenosis: Secondary | ICD-10-CM | POA: Insufficient documentation

## 2019-05-31 DIAGNOSIS — E785 Hyperlipidemia, unspecified: Secondary | ICD-10-CM

## 2019-05-31 MED ORDER — LISINOPRIL 20 MG PO TABS: 20.0000 mg | ORAL_TABLET | Freq: Every day | ORAL | 5 refills | Status: DC

## 2019-05-31 NOTE — Assessment & Plan Note (Signed)
Followed at [facility] for "moderate emphysema"

## 2019-05-31 NOTE — Progress Notes (Signed)
Cardiology Office Note:    Date:  05/31/2019   ID:  Barbara Mcconnell, DOB 12/14/1934, MRN [id number]  PCP:  Andreas Blowerabeza, Yuri M., MD  Cardiologist:  Peter SwazilandJordan, MD  Electrophysiologist:  None   Referring MD: Andreas Blowerabeza, Yuri M., MD   No chief complaint on file. Post hospital follow up  History of Present Illness:    Barbara Mcconnell is a 84 y.o. female with a hx of Takotsubo cardiomyopathy.  [facility], we have no records of that.  She has been followed by her primary care provider since.  Other medical issues include hypertension, peripheral vascular disease with moderate carotid stenosis, and dyslipidemia with a history of statin intolerance.  The patient tells me she has not been to her primary care provider in more than a year.  The patient [facility] [place] [facility]2019 with substernal chest pain and diaphoresis.  She ruled in for an MI.  Catheterization was done which showed a 50% RCA otherwise normal coronaries and an ejection fraction of 30 to 35% with moderate AS.  In the hospital she was somewhat hypotensive but this improved prior to discharge and she was put back on her lisinopril but at a lower dose.  She is in the office today for follow-up.  Not sure she has a good understanding of her medical issues.  She admitted to me that she is a very anxious person.  Since discharge she is not had chest pain or shortness of breath but she feels somewhat apprehensive, she says she feels "frail".  Blood pressure in the office was initially 152/82, I repeated this and get the exact same reading.  Past Medical History:  Diagnosis Date  . Coronary artery disease   . Hyperlipemia   . Hypertension     Past Surgical History:  Procedure Laterality Date  . RIGHT/LEFT HEART CATH AND CORONARY ANGIOGRAPHY N/A 05/13/2019   Procedure: RIGHT/LEFT HEART CATH AND CORONARY ANGIOGRAPHY;  Surgeon: SwazilandJordan, Peter M, MD;  [facility];   Service: Cardiovascular;  Laterality: N/A;  . TONSILLECTOMY      Current Medications: Current Meds  Medication Sig  . albuterol (VENTOLIN HFA) 108 (90 Base) MCG/ACT inhaler Inhale 2 puffs into the lungs every 4 (four) hours as needed for wheezing or shortness of breath (seasonal allergies).   Marland Kitchen. aspirin EC 81 MG tablet Take 81 mg by mouth See admin instructions. Take one tablet (81 mg) by mouth twice weekly - on Sunday and Thursday  . cetirizine (ZYRTEC) 10 MG tablet Take 10 mg by mouth daily with breakfast.  . Cholecalciferol (VITAMIN D3 PO) Take 1 tablet by mouth daily.  Marland Kitchen. docusate sodium (COLACE) 100 MG capsule Take 100 mg by mouth daily with breakfast.  . Liniments (SALONPAS EX) Place 1 patch onto the skin daily as needed (knee pain/ shoulder pain).  Marland Kitchen. lisinopril (ZESTRIL) 20 MG tablet Take 1 tablet (20 mg total) by mouth at bedtime.  . metoprolol succinate (TOPROL-XL) 25 MG 24 hr tablet Take 25 mg by mouth daily with breakfast.   . montelukast (SINGULAIR) 10 MG tablet Take 10 mg by mouth daily after supper.  . Multiple Vitamin (MULTIVITAMIN WITH MINERALS) TABS tablet Take 1 tablet by mouth daily. Centrum 50 plus  . Multiple Vitamins-Minerals (EMERGEN-C VITAMIN C) PACK Take 1 packet by mouth daily with lunch.  . Omega-3 Fatty Acids (FISH OIL PO) Take 1 capsule by mouth daily.   .Marland Kitchen  omeprazole (PRILOSEC) 20 MG capsule Take 20 mg by mouth daily before breakfast.  . rosuvastatin (CRESTOR) 5 MG tablet Take 5 mg by mouth daily after supper.   Terrance Mass Salicylate (ASPERCREME EX) Apply 1 application topically daily as needed (knee pain).  . [DISCONTINUED] lisinopril (ZESTRIL) 10 MG tablet Take 1 tablet (10 mg total) by mouth at bedtime.     Allergies:   Codeine and Prednisone   Social History   Socioeconomic History  . Marital status: Divorced    Spouse name: Not on file  . Number of children: Not on file  . Years of education: Not on file  . Highest education level: Not on file   Occupational History  . Not on file  Social Needs  . Financial resource strain: Not hard at all  . Food insecurity    Worry: Patient refused    Inability: Patient refused  . Transportation needs    Medical: No    Non-medical: No  Tobacco Use  . Smoking status: Never Smoker  . Smokeless tobacco: Never Used  Substance and Sexual Activity  . Alcohol use: No  . Drug use: No  . Sexual activity: Not Currently  Lifestyle  . Physical activity    Days per week: Not on file    Minutes per session: Not on file  . Stress: Not on file  Relationships  . Social connections    Talks on phone: More than three times a week    Gets together: More than three times a week    Attends religious service: Never    Active member of club or organization: Not on file    Attends meetings of clubs or organizations: Not on file    Relationship status: Not on file  Other Topics Concern  . Not on file  Social History Narrative  . Not on file     Family History: The patient's family history is not on file.  ROS:   Please see the history of present illness.     All other systems reviewed and are negative.  EKGs/Labs/Other Studies Reviewed:    The following studies were reviewed today: Cath 05/13/2019 Echo 05/14/2019  EKG:  EKG is ordered today.  The ekg ordered today demonstrates NSR, HR 81, LVH with repol changes  Recent Labs: 05/13/2019: ALT 27; B Natriuretic Peptide 450.2; TSH 1.406 05/15/2019: BUN 32; Creatinine, Ser 1.46; Hemoglobin 13.7; Platelets 259; Potassium 3.6; Sodium 128  Recent Lipid Panel No results found for: CHOL, TRIG, HDL, CHOLHDL, VLDL, LDLCALC, LDLDIRECT  Physical Exam:    VS:  BP (!) 152/82   Pulse 81   Temp 97.6 F (36.4 C)   Ht 5\' 1"  (1.549 m)   Wt 123 lb (55.8 kg)   BMI 23.24 kg/m     Wt Readings from Last 3 Encounters:  05/31/19 123 lb (55.8 kg)  05/15/19 119 lb 14.9 oz (54.4 kg)  06/09/16 130 lb (59 kg)     GEN: Thin caucasian female, well developed  in no acute distress HEENT: Normal NECK: No JVD; soft RICA stenosis LYMPHATICS: No lymphadenopathy CARDIAC: RRR, no murmurs, rubs, gallops RESPIRATORY:  Clear to auscultation without rales, wheezing or rhonchi  ABDOMEN: Soft, non-tender, non-distended MUSCULOSKELETAL:  No edema; No deformity  SKIN: Warm and dry NEUROLOGIC:  Alert and oriented x 3 PSYCHIATRIC:  Normal affect   ASSESSMENT:    Non-ST elevation (NSTEMI) myocardial infarction (HCC) 05/13/2019- HS Troponin peak 879  Takotsubo cardiomyopathy H/O remote Takotsubo event 5 years  ago- EF 30-35% by echo 05/13/2019 when she presented with second event  Renal insufficiency DC SCr 1.43-? chronic  Essential hypertension Controlled  Carotid arterial disease (HCC) 78-29% RICA, 56-21% LICA by doppler in 3086- [facility] She was evaluated by a vascular surgeon at [facility] who felt it was closer to 60% and 40%- f/u was recomended  CAD (coronary artery disease) 50% RCA at cath Oct 2020  Aortic stenosis Moderate AS by echo Oct 2020  Dyslipidemia, goal LDL below 70 On statin rx prior to admission Oct 2020   PLAN:    Increase lisinopril to her previous home dose of 20 mg daily. I'm not sure she would be a good candidate for Entresto.  Check BMP.  Order f/u carotid dopplers.. F/U with MD 4-6 weeks- repeat echo 3 months.    Medication Adjustments/Labs and Tests Ordered: Current medicines are reviewed at length with the patient today.  Concerns regarding medicines are outlined above.  Orders Placed This Encounter  Procedures  . Basic Metabolic Panel (BMET)  . EKG 12-Lead  . VAS US CAROTID   Meds ordered this encounter  Medications  . lisinopril (ZESTRIL) 20 MG tablet    Sig: Take 1 tablet (20 mg total) by mouth at bedtime.    Dispense:  30 tablet    Refill:  5    Patient Instructions  Medication Instructions:  INCREASE Lisinopril to 20mg  Take 1 tablet once a day  *If you need a refill on your cardiac medications before your  next appointment, please call your pharmacy*  Lab Work: Your physician recommends that you return for lab work in: TODAY-BMET If you have labs (blood work) drawn today and your tests are completely normal, you will receive your results only by: Marland Kitchen MyChart Message (if you have MyChart) OR . A paper copy in the mail If you have any lab test that is abnormal or we need to change your treatment, we will call you to review the results.  Testing/Procedures: Your physician has requested that you have a carotid duplex. This test is an ultrasound of the carotid arteries in your neck. It looks at blood flow through these arteries that supply the brain with blood. Allow one hour for this exam. There are no restrictions or special instructions.  Follow-Up: At [facility], you and your health needs are our priority.  As part of our continuing mission to provide you with exceptional heart care, we have created designated Provider Care Teams.  These Care Teams include your primary Cardiologist (physician) and Advanced Practice Providers (APPs -  Physician Assistants and Nurse Practitioners) who all work together to provide you with the care you need, when you need it.  Your next appointment:   4-6 WEEKS  The format for your next appointment:   In Person  Provider:   Peter Martinique, MD  Other Instructions      Signed, Kerin Ransom, PA-C  05/31/2019 2:19 PM    [place]

## 2019-05-31 NOTE — Assessment & Plan Note (Signed)
Moderate AS by echo Oct 2020

## 2019-05-31 NOTE — Assessment & Plan Note (Signed)
On statin rx prior to admission Oct 2020

## 2019-05-31 NOTE — Assessment & Plan Note (Signed)
Controlled.  

## 2019-05-31 NOTE — Assessment & Plan Note (Signed)
DC SCr 1.43-? chronic

## 2019-05-31 NOTE — Assessment & Plan Note (Signed)
05/13/2019- HS Troponin peak 879

## 2019-05-31 NOTE — Assessment & Plan Note (Addendum)
92-11% RICA, 94-17% LICA by doppler in 4081- [facility] She was evaluated by a vascular surgeon at [facility] who felt it was closer to 60% and 40%- f/u was recomended

## 2019-05-31 NOTE — Assessment & Plan Note (Signed)
50% RCA at cath Oct 2020

## 2019-05-31 NOTE — Assessment & Plan Note (Signed)
H/O remote Takotsubo event 5 years ago- EF 30-35% by echo 05/13/2019 when she presented with second event

## 2019-05-31 NOTE — Patient Instructions (Signed)
Medication Instructions:  INCREASE Lisinopril to 20mg  Take 1 tablet once a day  *If you need a refill on your cardiac medications before your next appointment, please call your pharmacy*  Lab Work: Your physician recommends that you return for lab work in: TODAY-BMET If you have labs (blood work) drawn today and your tests are completely normal, you will receive your results only by: Marland Kitchen MyChart Message (if you have MyChart) OR . A paper copy in the mail If you have any lab test that is abnormal or we need to change your treatment, we will call you to review the results.  Testing/Procedures: Your physician has requested that you have a carotid duplex. This test is an ultrasound of the carotid arteries in your neck. It looks at blood flow through these arteries that supply the brain with blood. Allow one hour for this exam. There are no restrictions or special instructions.  Follow-Up: At [facility], you and your health needs are our priority.  As part of our continuing mission to provide you with exceptional heart care, we have created designated Provider Care Teams.  These Care Teams include your primary Cardiologist (physician) and Advanced Practice Providers (APPs -  Physician Assistants and Nurse Practitioners) who all work together to provide you with the care you need, when you need it.  Your next appointment:   4-6 WEEKS  The format for your next appointment:   In Person  Provider:   Peter Martinique, MD  Other Instructions

## 2019-06-01 LAB — BASIC METABOLIC PANEL
BUN/Creatinine Ratio: 21 (ref 12–28)
BUN: 20 mg/dL (ref 8–27)
CO2: 24 mmol/L (ref 20–29)
Calcium: 10 mg/dL (ref 8.7–10.3)
Chloride: 100 mmol/L (ref 96–106)
Creatinine, Ser: 0.94 mg/dL (ref 0.57–1.00)
GFR calc Af Amer: 65 mL/min/{1.73_m2} (ref >59)
GFR calc non Af Amer: 57 mL/min/{1.73_m2} — ABNORMAL LOW (ref >59)
Glucose: 92 mg/dL (ref 65–99)
Potassium: 4.9 mmol/L (ref 3.5–5.2)
Sodium: 136 mmol/L (ref 134–144)

## 2019-07-09 NOTE — Progress Notes (Signed)
Cardiology Office Note:    Date:  07/11/2019   ID:  Barbara Mcconnell, DOB 11/04/1934, MRN [id number]  PCP:  Andreas Blowerabeza, Yuri M., MD  Cardiologist:  Shakeitha Umbaugh SwazilandJordan, MD  Electrophysiologist:  None   Referring MD: Andreas Blowerabeza, Yuri M., MD   Chief Complaint  Patient presents with  . Congestive Heart Failure    History of Present Illness:    Barbara CorkMary Guin is a 84 y.o. female is seen for follow upTakotsubo cardiomyopathy.  She had a cardiac event approximately [place] ago [place] [facility], we have no records of that. According to the patient this was Takotsubos cardiomyopathy.  She had been followed by her primary care provider since.  Other medical issues include hypertension, peripheral vascular disease with moderate carotid stenosis, and dyslipidemia with a history of statin intolerance.    The patient [facility] 05/13/2019 with substernal chest pain and diaphoresis.  She ruled in for an MI.  [facility] Catheterization was done which showed a 50% RCA otherwise normal coronaries and an ejection fraction of 30 to 35% with moderate AS. LV pattern was c/w Takotsubo's disease.  In the hospital she was somewhat hypotensive but this improved prior to discharge and she was put back on her lisinopril but at a lower dose. She is also on low dose Toprol.  On follow up today she states she is feeling well. Denies dyspnea or chest pain. Admits she is easily stressed. BP at home ranges from 121/66-163/77. Pulse in 70s. Very concerned about the pandemic. No edema. No palpitations.    Past Medical History:  Diagnosis Date  . Coronary artery disease   . Hyperlipemia   . Hypertension     Past Surgical History:  Procedure Laterality Date  . RIGHT/LEFT HEART CATH AND CORONARY ANGIOGRAPHY N/A 05/13/2019   Procedure: RIGHT/LEFT HEART CATH AND CORONARY ANGIOGRAPHY;  Surgeon: SwazilandJordan, Lynde Ludwig M, MD;  [facility];  Service: Cardiovascular;  Laterality: N/A;  .  TONSILLECTOMY      Current Medications: Current Meds  Medication Sig  . albuterol (VENTOLIN HFA) 108 (90 Base) MCG/ACT inhaler Inhale 2 puffs into the lungs every 4 (four) hours as needed for wheezing or shortness of breath (seasonal allergies).   Marland Kitchen. aspirin EC 81 MG tablet Take 81 mg by mouth See admin instructions. Take one tablet (81 mg) by mouth twice weekly - on Sunday and Thursday  . cetirizine (ZYRTEC) 10 MG tablet Take 10 mg by mouth daily with breakfast.  . Cholecalciferol (VITAMIN D3 PO) Take 1 tablet by mouth daily.  Marland Kitchen. docusate sodium (COLACE) 100 MG capsule Take 100 mg by mouth daily with breakfast.  . Liniments (SALONPAS EX) Place 1 patch onto the skin daily as needed (knee pain/ shoulder pain).  Marland Kitchen. lisinopril (ZESTRIL) 20 MG tablet Take 1 tablet (20 mg total) by mouth at bedtime.  . montelukast (SINGULAIR) 10 MG tablet Take 10 mg by mouth daily after supper.  . Multiple Vitamin (MULTIVITAMIN WITH MINERALS) TABS tablet Take 1 tablet by mouth daily. Centrum 50 plus  . Multiple Vitamins-Minerals (EMERGEN-C VITAMIN C) PACK Take 1 packet by mouth daily with lunch.  . Omega-3 Fatty Acids (FISH OIL PO) Take 1 capsule by mouth daily.   Marland Kitchen. omeprazole (PRILOSEC) 20 MG capsule Take 20 mg by mouth daily before breakfast.  . rosuvastatin (CRESTOR) 5 MG tablet Take 5 mg by mouth daily after supper.   Terrance Mass. Trolamine Salicylate (ASPERCREME EX) Apply 1 application  topically daily as needed (knee pain).  . [DISCONTINUED] metoprolol succinate (TOPROL-XL) 25 MG 24 hr tablet Take 25 mg by mouth daily with breakfast.      Allergies:   Codeine and Prednisone   Social History   Socioeconomic History  . Marital status: Divorced    Spouse name: Not on file  . Number of children: Not on file  . Years of education: Not on file  . Highest education level: Not on file  Occupational History  . Not on file  Tobacco Use  . Smoking status: Never Smoker  . Smokeless tobacco: Never Used  Substance and  Sexual Activity  . Alcohol use: No  . Drug use: No  . Sexual activity: Not Currently  Other Topics Concern  . Not on file  Social History Narrative  . Not on file   Social Determinants of Health   Financial Resource Strain: Low Risk   . Difficulty of Paying Living Expenses: Not hard at all  Food Insecurity: Unknown  . Worried About Charity fundraiser in the Last Year: Patient refused  . Ran Out of Food in the Last Year: Patient refused  Transportation Needs: No Transportation Needs  . Lack of Transportation (Medical): No  . Lack of Transportation (Non-Medical): No  Physical Activity:   . Days of Exercise per Week: Not on file  . Minutes of Exercise per Session: Not on file  Stress:   . Feeling of Stress : Not on file  Social Connections: Unknown  . Frequency of Communication with Friends and Family: More than three times a week  . Frequency of Social Gatherings with Friends and Family: More than three times a week  . Attends Religious Services: Never  . Active Member of Clubs or Organizations: Not on file  . Attends Archivist Meetings: Not on file  . Marital Status: Not on file     Family History: The patient's family history is not on file.  ROS:   Please see the history of present illness.     All other systems reviewed and are negative.  EKGs/Labs/Other Studies Reviewed:     EKG:  EKG is not ordered today.    Recent Labs: 05/13/2019: ALT 27; B Natriuretic Peptide 450.2; TSH 1.406 05/15/2019: Hemoglobin 13.7; Platelets 259 05/31/2019: BUN 20; Creatinine, Ser 0.94; Potassium 4.9; Sodium 136  Recent Lipid Panel No results found for: CHOL, TRIG, HDL, CHOLHDL, VLDL, LDLCALC, LDLDIRECT   Cardiac cath: 05/13/19:  RIGHT/LEFT HEART CATH AND CORONARY ANGIOGRAPHY  Conclusion    Prox RCA lesion is 50% stenosed.  There is moderate to severe left ventricular systolic dysfunction.  LV end diastolic pressure is moderately elevated.  The left ventricular  ejection fraction is 35-45% by visual estimate.  There is trivial (1+) mitral regurgitation.  Hemodynamic findings consistent with moderate pulmonary hypertension.   1. Nonobstructive CAD 2. Moderate to severe LV dysfunction. EF 35-40%. Wall motion abnormality classic for Takotsubo's cardiomyopathy 3. Moderately elevated LV filling pressures 4. Moderate pulmonary venous HTN 5. Reduced cardiac output. CI 2.3  Plan: patient was SOB in the lab. We administered IV Ntg and lasix in the lab and increased oxygen with improvement in her symptoms and improvement in PA sat from 62% to 66%. Will continue with IV diuresis, continue ACEi and Metoprolol. Echo.    Echo 05/14/19: 1. There is apical ballooning with akinesis of the mid and apical lateral, mid inferoseptal, apical septal, apical, mid and apical anterior, apical inferior, mid anteroseptal and  apical inferolateral walls. Left ventricular ejection fraction, by visual estimation, is 30 to 35%. There is severely increased left ventricular hypertrophy. The left ventricular hypertrophy involves basal-septum walls. Findings consistent with stress induced CM. 2. Left ventricular diastolic Doppler parameters are consistent with impaired relaxation pattern of LV diastolic filling. 3. Global right ventricle has normal systolic function.The right ventricular size is normal. No increase in right ventricular wall thickness. 4. Left atrial size was normal. 5. Right atrial size was normal. 6. Moderate mitral annular calcification. 7. The mitral valve is normal in structure. Mild mitral valve regurgitation. No evidence of mitral stenosis. 8. The tricuspid valve is normal in structure. Tricuspid valve regurgitation is mild. 9. The aortic valve is tricuspid. There is Severely thickening of the aortic valve. There is Severe calcifcation of the aortic valve. Aortic valve regurgitation was not visualized by color flow Doppler. Moderate aortic valve stenosis.  The degree of aortic stenosis may be underestimated due to underlying LV dysfunction 10. The pulmonic valve was normal in structure. Pulmonic valve regurgitation is trivial by color   Physical Exam:    VS:  BP (!) 191/87   Pulse 73   Ht  (1.549 m)   Wt 128 lb (58.1 kg)   SpO2 98%   BMI 24.19 kg/m     Wt Readings from Last 3 Encounters:  07/11/19 128 lb (58.1 kg)  05/31/19 123 lb (55.8 kg)  05/15/19 119 lb 14.9 oz (54.4 kg)     GEN: Thin caucasian female, well developed in no acute distress HEENT: Normal NECK: No JVD; soft RICA bruit. LYMPHATICS: No lymphadenopathy CARDIAC: RRR, no murmurs, rubs, gallops RESPIRATORY:  Clear to auscultation without rales, wheezing or rhonchi  ABDOMEN: Soft, non-tender, non-distended MUSCULOSKELETAL:  No edema; No deformity  SKIN: Warm and dry NEUROLOGIC:  Alert and oriented x 3 PSYCHIATRIC:  Normal affect   ASSESSMENT:    1.s/p Non-ST elevation (NSTEMI) myocardial infarction (HCC) due to demand ischemia in setting of stress induced CM 05/13/2019- HS Troponin peak 879. With decreased EF and no significant CAD strongly suspect this is related to recurrent Takotsubo cardiomyopathy  2. Takotsubo cardiomyopathy H/O remote Takotsubo event 5 years ago-Recurrent in October 2020 EF 30-35% by echo 05/13/2019 when she presented with second event - she is asymptomatic.  - continue lisinopril 20 mg daily - increase Toprol XL to 50 mg daily - recommend repeat Echo. She is very concerned about Covid exposure so wants to wait until the end of February or until she gets the vaccine. Will tentatively schedule for then.  3. Aortic stenosis. Moderate by Echo in October. Will update echo and follow.   4. Essential hypertension Poorly controlled today. Generally better control at home. Will increase Toprol XL and monitor.   5. Carotid arterial disease (HCC) 60-79% RICA, 40-59% LICA by doppler in 2019- [facility] She was evaluated by a vascular surgeon at  [facility] who felt it was closer to 60% and 40%- f/u was recommended. Dopplers done today. Preliminary review indicates 40-59% right ICA stenosis and <39% left ICA stenosis.   6. CAD (coronary artery disease) 50% RCA at cath Oct 2020  7. Dyslipidemia, goal LDL below 70 On statin rx prior to admission Oct 2020. I see no lipid levels in record. Will repeat on follow up visit.     Medication Adjustments/Labs and Tests Ordered: Current medicines are reviewed at length with the patient today.  Concerns regarding medicines are outlined above.  No orders of the defined types were placed  in this encounter.  No orders of the defined types were placed in this encounter.   Patient Instructions  Increase Toprol XL to 50 mg daily  We will schedule for an Echocardiogram in February  Follow up in 4 months        Signed, Dena Esperanza [place], MD  07/11/2019 4:27 PM    [facility]

## 2019-07-11 ENCOUNTER — Ambulatory Visit (INDEPENDENT_AMBULATORY_CARE_PROVIDER_SITE_OTHER): Payer: Medicare HMO | Admitting: Cardiology

## 2019-07-11 ENCOUNTER — Other Ambulatory Visit (HOSPITAL_COMMUNITY): Payer: Self-pay | Admitting: Cardiology

## 2019-07-11 ENCOUNTER — Encounter: Payer: Self-pay | Admitting: Cardiology

## 2019-07-11 ENCOUNTER — Other Ambulatory Visit: Payer: Self-pay

## 2019-07-11 ENCOUNTER — Ambulatory Visit (HOSPITAL_COMMUNITY)
Admission: RE | Admit: 2019-07-11 | Discharge: 2019-07-11 | Disposition: A | Discharge status: Home / Self Care | Payer: Medicare HMO | Source: Ambulatory Visit | Attending: Cardiology | Admitting: Cardiology

## 2019-07-11 VITALS — BP 191/87 | HR 73 | Ht 61.0 in | Wt 128.0 lb

## 2019-07-11 DIAGNOSIS — J439 Emphysema, unspecified: Secondary | ICD-10-CM | POA: Insufficient documentation

## 2019-07-11 DIAGNOSIS — I350 Nonrheumatic aortic (valve) stenosis: Secondary | ICD-10-CM

## 2019-07-11 DIAGNOSIS — I6523 Occlusion and stenosis of bilateral carotid arteries: Secondary | ICD-10-CM | POA: Diagnosis present

## 2019-07-11 DIAGNOSIS — I214 Non-ST elevation (NSTEMI) myocardial infarction: Secondary | ICD-10-CM | POA: Insufficient documentation

## 2019-07-11 DIAGNOSIS — I10 Essential (primary) hypertension: Secondary | ICD-10-CM | POA: Diagnosis present

## 2019-07-11 DIAGNOSIS — E785 Hyperlipidemia, unspecified: Secondary | ICD-10-CM | POA: Insufficient documentation

## 2019-07-11 DIAGNOSIS — N289 Disorder of kidney and ureter, unspecified: Secondary | ICD-10-CM

## 2019-07-11 DIAGNOSIS — I5181 Takotsubo syndrome: Secondary | ICD-10-CM | POA: Insufficient documentation

## 2019-07-11 DIAGNOSIS — I2510 Atherosclerotic heart disease of native coronary artery without angina pectoris: Secondary | ICD-10-CM | POA: Insufficient documentation

## 2019-07-11 MED ORDER — METOPROLOL SUCCINATE ER 50 MG PO TB24: 50.0000 mg | ORAL_TABLET | Freq: Every day | ORAL | 3 refills | Status: DC

## 2019-07-11 NOTE — Addendum Note (Signed)
Addended by: Kathyrn Lass on: 07/11/2019 04:34 PM   Modules accepted: Orders

## 2019-07-11 NOTE — Patient Instructions (Addendum)
Increase Toprol XL to 50 mg daily  We will schedule for an Echocardiogram in February  Follow up in 4 months

## 2019-07-15 ENCOUNTER — Telehealth: Payer: Self-pay | Admitting: Cardiology

## 2019-07-15 NOTE — Telephone Encounter (Signed)
Patient is heading out for an appt, please leave detailed message on voice mail.

## 2019-07-15 NOTE — Telephone Encounter (Signed)
    Please return call to patient with carotid results

## 2019-07-17 ENCOUNTER — Telehealth: Payer: Self-pay | Admitting: Cardiology

## 2019-07-17 NOTE — Telephone Encounter (Signed)
Encounter not needed

## 2019-07-29 ENCOUNTER — Other Ambulatory Visit: Payer: Self-pay

## 2019-08-23 ENCOUNTER — Ambulatory Visit: Payer: Medicare HMO

## 2019-09-06 ENCOUNTER — Other Ambulatory Visit (HOSPITAL_COMMUNITY): Payer: Medicare HMO

## 2019-09-16 ENCOUNTER — Other Ambulatory Visit (HOSPITAL_BASED_OUTPATIENT_CLINIC_OR_DEPARTMENT_OTHER): Payer: Medicare HMO

## 2019-09-23 ENCOUNTER — Ambulatory Visit (HOSPITAL_BASED_OUTPATIENT_CLINIC_OR_DEPARTMENT_OTHER)
Admission: RE | Admit: 2019-09-23 | Discharge: 2019-09-23 | Disposition: A | Discharge status: Home / Self Care | Payer: Medicare HMO | Source: Ambulatory Visit | Attending: Cardiology | Admitting: Cardiology

## 2019-09-23 ENCOUNTER — Other Ambulatory Visit (HOSPITAL_BASED_OUTPATIENT_CLINIC_OR_DEPARTMENT_OTHER): Payer: Self-pay | Admitting: Internal Medicine

## 2019-09-23 DIAGNOSIS — N289 Disorder of kidney and ureter, unspecified: Secondary | ICD-10-CM | POA: Diagnosis not present

## 2019-09-23 DIAGNOSIS — I350 Nonrheumatic aortic (valve) stenosis: Secondary | ICD-10-CM | POA: Diagnosis not present

## 2019-09-23 DIAGNOSIS — I10 Essential (primary) hypertension: Secondary | ICD-10-CM | POA: Diagnosis not present

## 2019-09-23 DIAGNOSIS — I5181 Takotsubo syndrome: Secondary | ICD-10-CM | POA: Insufficient documentation

## 2019-09-23 NOTE — Progress Notes (Signed)
  Echocardiogram 2D Echocardiogram has been performed.  Sinda Du 09/23/2019, 2:03 PM

## 2019-09-24 ENCOUNTER — Other Ambulatory Visit: Payer: Self-pay

## 2019-09-24 DIAGNOSIS — I5181 Takotsubo syndrome: Secondary | ICD-10-CM

## 2019-09-24 DIAGNOSIS — I350 Nonrheumatic aortic (valve) stenosis: Secondary | ICD-10-CM

## 2019-10-04 ENCOUNTER — Telehealth: Payer: Self-pay | Admitting: Cardiology

## 2019-10-04 NOTE — Telephone Encounter (Signed)
Patient returned my call and states she will call [phone] closer to September to schedule

## 2019-10-04 NOTE — Telephone Encounter (Signed)
Left message for patient to call and schedule Echo in September at [facility] ordered by Dr. Sw[place]iland

## 2019-12-12 ENCOUNTER — Other Ambulatory Visit: Payer: Self-pay | Admitting: Cardiology

## 2020-04-15 ENCOUNTER — Other Ambulatory Visit (HOSPITAL_BASED_OUTPATIENT_CLINIC_OR_DEPARTMENT_OTHER): Payer: Medicare HMO

## 2020-07-10 ENCOUNTER — Encounter (HOSPITAL_COMMUNITY): Payer: Medicare HMO

## 2021-06-30 ENCOUNTER — Other Ambulatory Visit: Payer: Self-pay

## 2021-06-30 ENCOUNTER — Encounter (HOSPITAL_BASED_OUTPATIENT_CLINIC_OR_DEPARTMENT_OTHER): Payer: Self-pay | Admitting: Emergency Medicine

## 2021-06-30 ENCOUNTER — Emergency Department (HOSPITAL_BASED_OUTPATIENT_CLINIC_OR_DEPARTMENT_OTHER): Payer: Medicare HMO

## 2021-06-30 ENCOUNTER — Emergency Department (HOSPITAL_BASED_OUTPATIENT_CLINIC_OR_DEPARTMENT_OTHER)
Admission: EM | Admit: 2021-06-30 | Discharge: 2021-06-30 | Disposition: A | Discharge status: Home / Self Care | Payer: Medicare HMO | Attending: Emergency Medicine | Admitting: Emergency Medicine

## 2021-06-30 DIAGNOSIS — R030 Elevated blood-pressure reading, without diagnosis of hypertension: Secondary | ICD-10-CM

## 2021-06-30 DIAGNOSIS — J984 Other disorders of lung: Secondary | ICD-10-CM

## 2021-06-30 DIAGNOSIS — R0602 Shortness of breath: Secondary | ICD-10-CM | POA: Diagnosis not present

## 2021-06-30 DIAGNOSIS — Z7982 Long term (current) use of aspirin: Secondary | ICD-10-CM | POA: Insufficient documentation

## 2021-06-30 DIAGNOSIS — Z79899 Other long term (current) drug therapy: Secondary | ICD-10-CM | POA: Insufficient documentation

## 2021-06-30 DIAGNOSIS — R0789 Other chest pain: Secondary | ICD-10-CM | POA: Insufficient documentation

## 2021-06-30 DIAGNOSIS — I10 Essential (primary) hypertension: Secondary | ICD-10-CM | POA: Diagnosis not present

## 2021-06-30 DIAGNOSIS — M546 Pain in thoracic spine: Secondary | ICD-10-CM | POA: Insufficient documentation

## 2021-06-30 DIAGNOSIS — R918 Other nonspecific abnormal finding of lung field: Secondary | ICD-10-CM | POA: Insufficient documentation

## 2021-06-30 DIAGNOSIS — I2510 Atherosclerotic heart disease of native coronary artery without angina pectoris: Secondary | ICD-10-CM | POA: Insufficient documentation

## 2021-06-30 DIAGNOSIS — R079 Chest pain, unspecified: Secondary | ICD-10-CM | POA: Diagnosis present

## 2021-06-30 HISTORY — DX: Takotsubo syndrome: I51.81

## 2021-06-30 LAB — BASIC METABOLIC PANEL
Anion gap: 9 (ref 5–15)
BUN: 18 mg/dL (ref 8–23)
CO2: 25 mmol/L (ref 22–32)
Calcium: 9.1 mg/dL (ref 8.9–10.3)
Chloride: 96 mmol/L — ABNORMAL LOW (ref 98–111)
Creatinine, Ser: 0.87 mg/dL (ref 0.44–1.00)
GFR, Estimated: >60 mL/min (ref >60)
Glucose, Bld: 110 mg/dL — ABNORMAL HIGH (ref 70–99)
Potassium: 4.2 mmol/L (ref 3.5–5.1)
Sodium: 130 mmol/L — ABNORMAL LOW (ref 135–145)

## 2021-06-30 LAB — CBC WITH DIFFERENTIAL/PLATELET
Abs Immature Granulocytes: 0.03 10*3/uL (ref 0.00–0.07)
Basophils Absolute: 0 10*3/uL (ref 0.0–0.1)
Basophils Relative: 0 %
Eosinophils Absolute: 0 10*3/uL (ref 0.0–0.5)
Eosinophils Relative: 0 %
HCT: 38.6 % (ref 36.0–46.0)
Hemoglobin: 12.8 g/dL (ref 12.0–15.0)
Immature Granulocytes: 0 %
Lymphocytes Relative: 20 %
Lymphs Abs: 1.6 10*3/uL (ref 0.7–4.0)
MCH: 29.5 pg (ref 26.0–34.0)
MCHC: 33.2 g/dL (ref 30.0–36.0)
MCV: 88.9 fL (ref 80.0–100.0)
Monocytes Absolute: 0.8 10*3/uL (ref 0.1–1.0)
Monocytes Relative: 10 %
Neutro Abs: 5.6 10*3/uL (ref 1.7–7.7)
Neutrophils Relative %: 70 %
Platelets: 199 10*3/uL (ref 150–400)
RBC: 4.34 MIL/uL (ref 3.87–5.11)
RDW: 13.2 % (ref 11.5–15.5)
WBC: 8.1 10*3/uL (ref 4.0–10.5)
nRBC: 0 % (ref 0.0–0.2)

## 2021-06-30 LAB — BRAIN NATRIURETIC PEPTIDE: B Natriuretic Peptide: 350.9 pg/mL — ABNORMAL HIGH (ref 0.0–100.0)

## 2021-06-30 LAB — TROPONIN I (HIGH SENSITIVITY)
Troponin I (High Sensitivity): 11 ng/L (ref <18)
Troponin I (High Sensitivity): 13 ng/L (ref <18)

## 2021-06-30 MED ORDER — LIDOCAINE 5 % EX PTCH: 1.0000 | MEDICATED_PATCH | CUTANEOUS | 0 refills | Status: DC

## 2021-06-30 MED ORDER — CYCLOBENZAPRINE HCL 5 MG PO TABS: 10.0000 mg | ORAL_TABLET | Freq: Two times a day (BID) | ORAL | 0 refills | Status: DC | PRN

## 2021-06-30 MED ORDER — KETOROLAC TROMETHAMINE 30 MG/ML IJ SOLN
15.0000 mg | Freq: Once | INTRAMUSCULAR | Status: AC
  Administered 2021-06-30: 15 mg via INTRAVENOUS
  Filled 2021-06-30: qty 1

## 2021-06-30 MED ORDER — IOHEXOL 350 MG/ML SOLN
100.0000 mL | Freq: Once | INTRAVENOUS | Status: AC | PRN
  Administered 2021-06-30: 100 mL via INTRAVENOUS

## 2021-06-30 NOTE — Discharge Instructions (Addendum)
Your history, exam, work-up today did not show evidence of an acute cardiac cause of her symptoms with negative troponins and the CT scan did not show evidence of any blood clot, dissection, or aneurysm in your chest.  It did show atherosclerosis as we discussed and also showed possible atypical/chronic/indolent infection in the lungs.  Your blood pressure was also quite variable today, please follow-up with your primary doctor to discuss a blood pressure medication titration as well as discussing follow-up with pulmonology and/or treating the atypical finding on the CT scan.  Please use the Lidoderm patches to help with the pain which we suspect is musculoskeletal in nature and also consider using the lower dose muscle relaxant to help.  Please be careful not to fall as it can be a sedating medication.  If any symptoms develop, change, or worsen acutely, please return to the nearest emergency department.

## 2021-06-30 NOTE — ED Triage Notes (Signed)
Pt states she lifted a heavy pot yesterday.  Pt states she later started to have left shoulder and chest pain on inspiration.  Pt states she put on a patch and took ibuprofen.  The pain moved in her back between her shoulder blades.  She continues to have tightness in her chest with inspiration.

## 2021-06-30 NOTE — ED Provider Notes (Signed)
[facility] EMERGENCY DEPARTMENT Provider Note   CSN: [id number] Arrival date & time: 06/30/21  1149     History Chief Complaint  Patient presents with   Chest Pain   Back Pain    Barbara Mcconnell is a 86 y.o. female.  She has known coronary disease hyperlipidemia hypertension aortic stenosis and had a Takotsubo event a few years ago.  She has been having some pain in her left shoulder and between her shoulder blades little bit of tightness in her chest that began yesterday after lifting a heavy pot.  She thinks she may feel little bit short of breath.  Not associated with any diaphoresis nausea lightheadedness.  No abdominal pain vomiting diarrhea.  No numbness or weakness.  Also states she gets shoulder pain with statins and her doctor recently increased her statin.  The history is provided by the patient.  Chest Pain Pain location:  Substernal area Pain quality: tightness   Pain radiates to:  L shoulder and upper back Pain severity:  Moderate Onset quality:  Gradual Duration:  24 hours Timing:  Intermittent Progression:  Unchanged Chronicity:  New Context: lifting   Relieved by: nsaid. Worsened by:  Deep breathing Ineffective treatments:  None tried Associated symptoms: back pain and shortness of breath   Associated symptoms: no abdominal pain, no cough, no diaphoresis, no fever, no headache, no nausea and no vomiting   Risk factors: coronary artery disease, high cholesterol and hypertension   Risk factors: no smoking   Back Pain Location:  Thoracic spine Quality:  Aching Radiates to:  L shoulder Pain severity:  Moderate Pain is:  Same all the time Onset quality:  Gradual Duration:  24 hours Timing:  Intermittent Progression:  Unchanged Chronicity:  New Context: lifting heavy objects   Relieved by:  NSAIDs Worsened by:  Bending, deep breathing and movement Ineffective treatments:  None tried Associated symptoms: chest pain   Associated symptoms: no  abdominal pain, no dysuria, no fever and no headaches       Past Medical History:  Diagnosis Date   Coronary artery disease    Hyperlipemia    Hypertension     Patient Active Problem List   Diagnosis Date Noted   Aortic stenosis 05/31/2019   Renal insufficiency 05/31/2019   CAD (coronary artery disease) 05/31/2019   Non-ST elevation (NSTEMI) myocardial infarction ([place]) 05/13/2019   Essential hypertension 05/13/2019   Dyslipidemia, goal LDL below 70 05/13/2019   Carotid arterial disease ([place]) 05/13/2019   Takotsubo cardiomyopathy     Past Surgical History:  Procedure Laterality Date   RIGHT/LEFT HEART CATH AND CORONARY ANGIOGRAPHY N/A 05/13/2019   Procedure: RIGHT/LEFT HEART CATH AND CORONARY ANGIOGRAPHY;  Surgeon: Martinique, Peter M, MD;  Location: [facility];  Service: Cardiovascular;  Laterality: N/A;   TONSILLECTOMY       OB History   No obstetric history on file.     No family history on file.  Social History   Tobacco Use   Smoking status: Never   Smokeless tobacco: Never  Vaping Use   Vaping Use: Never used  Substance Use Topics   Alcohol use: No   Drug use: No    Home Medications Prior to Admission medications   Medication Sig Start Date End Date Taking? Authorizing Provider  albuterol (VENTOLIN HFA) 108 (90 Base) MCG/ACT inhaler Inhale 2 puffs into the lungs every 4 (four) hours as needed for wheezing or shortness of breath (seasonal allergies).  02/04/19   [provider]  aspirin EC 81 MG tablet Take 81 mg by mouth See admin instructions. Take one tablet (81 mg) by mouth twice weekly - on Sunday and Thursday    [provider]  cetirizine (ZYRTEC) 10 MG tablet Take 10 mg by mouth daily with breakfast.    [provider]  Cholecalciferol (VITAMIN D3 PO) Take 1 tablet by mouth daily.    [provider]  docusate sodium (COLACE) 100 MG capsule Take 100 mg by mouth daily with breakfast.    [provider]   Liniments (SALONPAS EX) Place 1 patch onto the skin daily as needed (knee pain/ shoulder pain).    [provider]  lisinopril (ZESTRIL) 20 MG tablet Take 1 tablet (20 mg total) by mouth at bedtime. 12/12/19   Erlene Quan, PA-C  metoprolol succinate (TOPROL XL) 25 MG 24 hr tablet Take 1 tablet (25 mg total) by mouth daily. 07/29/19   Martinique, Peter M, MD  montelukast (SINGULAIR) 10 MG tablet Take 10 mg by mouth daily after supper. 12/17/18   [provider]  Multiple Vitamin (MULTIVITAMIN WITH MINERALS) TABS tablet Take 1 tablet by mouth daily. Centrum 50 plus    [provider]  Multiple Vitamins-Minerals (EMERGEN-C VITAMIN C) PACK Take 1 packet by mouth daily with lunch.    [provider]  Omega-3 Fatty Acids (FISH OIL PO) Take 1 capsule by mouth daily.     [provider]  omeprazole (PRILOSEC) 20 MG capsule Take 20 mg by mouth daily before breakfast. 03/19/19   [provider]  rosuvastatin (CRESTOR) 5 MG tablet Take 5 mg by mouth daily after supper.  03/17/19   [provider]  Trolamine Salicylate (ASPERCREME EX) Apply 1 application topically daily as needed (knee pain).    [provider]    Allergies    Codeine and Prednisone  Review of Systems   Review of Systems  Constitutional:  Negative for diaphoresis and fever.  HENT:  Negative for sore throat.   Eyes:  Negative for visual disturbance.  Respiratory:  Positive for shortness of breath. Negative for cough.   Cardiovascular:  Positive for chest pain.  Gastrointestinal:  Negative for abdominal pain, nausea and vomiting.  Genitourinary:  Negative for dysuria.  Musculoskeletal:  Positive for back pain.  Skin:  Negative for rash.  Neurological:  Negative for headaches.   Physical Exam Updated Vital Signs BP (!) 189/79   Pulse 88   Temp 100.1 F (37.8 C) (Oral)   Resp (!) 22   Ht _0  (1.549 m)   Wt 59.7 kg   SpO2 98%   BMI 24.87 kg/m   Physical  Exam Vitals and nursing note reviewed.  Constitutional:      General: She is not in acute distress.    Appearance: Normal appearance. She is well-developed.  HENT:     Head: Normocephalic and atraumatic.  Eyes:     Conjunctiva/sclera: Conjunctivae normal.  Cardiovascular:     Rate and Rhythm: Normal rate and regular rhythm.     Heart sounds: Murmur heard.  Systolic murmur is present.  Pulmonary:     Effort: Pulmonary effort is normal. No respiratory distress.     Breath sounds: Normal breath sounds.  Abdominal:     Palpations: Abdomen is soft.     Tenderness: There is no abdominal tenderness.  Musculoskeletal:        General: No swelling. Normal range of motion.     Cervical back: Neck  supple.     Right lower leg: No tenderness. No edema.     Left lower leg: No tenderness. No edema.  Skin:    General: Skin is warm and dry.     Capillary Refill: Capillary refill takes less than 2 seconds.  Neurological:     General: No focal deficit present.     Mental Status: She is alert.     Sensory: No sensory deficit.     Motor: No weakness.  Psychiatric:        Mood and Affect: Mood normal.    ED Results / Procedures / Treatments   Labs (all labs ordered are listed, but only abnormal results are displayed) Labs Reviewed  BASIC METABOLIC PANEL - Abnormal; Notable for the following components:      Result Value   Sodium 130 (*)    Chloride 96 (*)    Glucose, Bld 110 (*)    All other components within normal limits  BRAIN NATRIURETIC PEPTIDE - Abnormal; Notable for the following components:   B Natriuretic Peptide 350.9 (*)    All other components within normal limits  CBC WITH DIFFERENTIAL/PLATELET  TROPONIN I (HIGH SENSITIVITY)  TROPONIN I (HIGH SENSITIVITY)    EKG EKG Interpretation  Date/Time:  Wednesday June 30 2021 11:58:33 EST Ventricular Rate:  94 PR Interval:  148 QRS Duration: 98 QT Interval:  354 QTC Calculation: 442 R Axis:   37 Text  Interpretation: Normal sinus rhythm Possible Left atrial enlargement Minimal voltage criteria for LVH, may be normal variant ( Cornell product ) ST elevation, consider early repolarization, pericarditis, or injury Abnormal ECG sl inferior ST elevation/pr depression Confirmed by Aletta Edouard [phone]) on 06/30/2021 12:09:31 PM  Radiology DG Chest Port 1 View  Result Date: 06/30/2021 CLINICAL DATA:  Chest pain. Additional history provided: Patient reports lifting a heavy pot yesterday, left shoulder and chest pain upon inspiration, pain in back and between shoulder blades, chest tightness with inspiration. EXAM: PORTABLE CHEST 1 VIEW COMPARISON:  Chest radiograph 05/13/2019 and earlier. FINDINGS: Cardiomegaly. Aortic atherosclerosis. Suspected trace left pleural effusion. Minimal atelectasis and/or scarring within the lung bases. No evidence of pneumothorax. No acute bony abnormality identified. IMPRESSION: Suspected trace left pleural effusion. Minimal atelectasis and/or scarring within the lung bases. Cardiomegaly. Aortic Atherosclerosis (ICD10-I70.0). Electronically Signed   By: Kellie Simmering D.O.   On: 06/30/2021 12:59   CT Angio Chest/Abd/Pel for Dissection W and/or Wo Contrast  Result Date: 06/30/2021 CLINICAL DATA:  Chest pain radiating to the back and shoulder with elevated blood pressure EXAM: CT ANGIOGRAPHY CHEST, ABDOMEN AND PELVIS TECHNIQUE: Non-contrast CT of the chest was initially obtained. Multidetector CT imaging through the chest, abdomen and pelvis was performed using the standard protocol during bolus administration of intravenous contrast. Multiplanar reconstructed images and MIPs were obtained and reviewed to evaluate the vascular anatomy. CONTRAST:  167m OMNIPAQUE IOHEXOL 350 MG/ML SOLN COMPARISON:  Chest x-ray 06/30/2021, CT chest 05/13/2019, 02/16/2021, CT abdomen and pelvis 09/23/2013 FINDINGS: CTA CHEST FINDINGS Cardiovascular: Non contrasted images of the chest demonstrate no acute  intramural hematoma. Moderate aortic atherosclerosis. No aneurysm or dissection. Coronary vascular calcifications. Borderline cardiomegaly. No sizable pericardial effusion Mediastinum/Nodes: Midline trachea. No thyroid mass. No suspicious adenopathy. Esophagus unremarkable Lungs/Pleura: Small left-sided pleural effusion with partial atelectasis in the left lower lobe. Scattered bilateral areas of mild bronchiectasis and tree-in-bud density. Probable right middle lobe and lingular scarring. No acute consolidation. Musculoskeletal: Sternum is intact. Vertebral body heights are normal. Old appearing fracture deformities involving the  right third, fourth, and fifth anterior ribs. Stable sclerotic lesion in the T6 vertebral body. Review of the MIP images confirms the above findings. CTA ABDOMEN AND PELVIS FINDINGS VASCULAR Aorta: Moderate aortic atherosclerosis. No aneurysm, dissection, or occlusive disease. Celiac: Origin calcification without significant stenosis. No aneurysm or dissection. SMA: Patent without evidence of aneurysm, dissection, vasculitis or significant stenosis. Renals: Single right and single left renal arteries. Calcification at the origin of right renal artery with mild narrowing. No hemodynamically significant stenosis. IMA: Patent without evidence of aneurysm, dissection, vasculitis or significant stenosis. Inflow: Moderate atherosclerosis without aneurysm, dissection, or occlusive disease. Review of the MIP images confirms the above findings. NON-VASCULAR Hepatobiliary: Subcentimeter hypodensity in the inferior right hepatic lobe too small to further characterize. No calcified gallstone or biliary dilatation Pancreas: Unremarkable. No pancreatic ductal dilatation or surrounding inflammatory changes. Spleen: Normal in size without focal abnormality. Adrenals/Urinary Tract: Adrenal glands are normal. No hydronephrosis. Cortical scarring left kidney. The bladder is unremarkable. Stomach/Bowel:  Stomach is within normal limits. Appendix appears normal. No evidence of bowel wall thickening, distention, or inflammatory changes. Lymphatic: No significantly enlarged lymph nodes. Reproductive: Uterus and bilateral adnexa are unremarkable. Other: Negative for pelvic effusion or free air. Musculoskeletal: No acute osseous abnormality. Grade 1 anterolisthesis L4 on L5. Review of the MIP images confirms the above findings. IMPRESSION: 1. Negative for acute aortic dissection or aneurysm. No acute aortic occlusive disease. 2. Moderate aortic atherosclerosis without hemodynamically significant stenosis within the abdomen or pelvis. 3. Chronic lung disease with areas of bronchiectasis and tree-in-bud density suggesting chronic/indolent infection, possible MAC. 4. Small left-sided pleural effusion Electronically Signed   By: Donavan Foil M.D.   On: 06/30/2021 17:58    Procedures Procedures   Medications Ordered in ED Medications  ketorolac (TORADOL) 30 MG/ML injection 15 mg (15 mg Intravenous Given 06/30/21 1408)  iohexol (OMNIPAQUE) 350 MG/ML injection 100 mL (100 mLs Intravenous Contrast Given 06/30/21 1713)    ED Course  I have reviewed the triage vital signs and the nursing notes.  Pertinent labs & imaging results that were available during my care of the patient were reviewed by me and considered in my medical decision making (see chart for details).  Clinical Course as of 06/30/21 1828  Wed Jun 30, 2021  1211 Cath 2020 -    Prox RCA lesion is 50% stenosed.  There is moderate to severe left ventricular systolic dysfunction.  LV end diastolic pressure is moderately elevated.  The left ventricular ejection fraction is 35-45% by visual estimate.  There is trivial (1+) mitral regurgitation.  Hemodynamic findings consistent with moderate pulmonary hypertension.   1. Nonobstructive CAD 2. Moderate to severe LV dysfunction. EF 35-40%. Wall motion abnormality classic for Takotsubo's  cardiomyopathy 3. Moderately elevated LV filling pressures 4. Moderate pulmonary venous HTN 5. Reduced cardiac output. CI 2.3  [MB]  1302 Discussed with: Cardiology Dr. Miachel Roux.  He agrees that the inferior ST segments do look elevated although there are somewhat also on prior EKGs.  Recommended getting her blood pressure down a little bit and following up on the troponin. [MB]    Clinical Course User Index [MB] Hayden Rasmussen, MD   MDM Rules/Calculators/A&P                          This patient complains of left shoulder pain chest pain upper back pain; this involves an extensive number of treatment Options and is a complaint that carries with it  a high risk of complications and Morbidity. The differential includes musculoskeletal pain, ACS, pneumonia, pneumonia thorax, dissection, reflux, PE   I ordered, reviewed and interpreted labs, which included CBC with normal white count normal hemoglobin, chemistries with low sodium has been low prior, normal renal function, troponins flat, BNP mildly elevated although better than prior I ordered medication IV Toradol with some improvement in her pain I ordered imaging studies which included chest x-ray and I independently    visualized and interpreted imaging which showed no acute disease, possible left effusion Additional history obtained from patient's daughter Previous records obtained and reviewed in epic, no recent admissions  After the interventions stated above, I reevaluated the patient and found patient still to be in discomfort although appears in no distress.  Blood pressure elevated with history of hypertension.  Her care is signed out to oncoming provider Dr. Sherry Ruffing to follow-up on delta troponin and consideration for possible further imaging.  Likely disposition home with outpatient follow-up.   Final Clinical Impression(s) / ED Diagnoses Final diagnoses:  Other chest pain  Acute thoracic back pain, unspecified back pain  laterality  Elevated blood pressure reading  Lung abnormality    Rx / DC Orders ED Discharge Orders     None        Hayden Rasmussen, MD 06/30/21 [phone]

## 2021-06-30 NOTE — ED Provider Notes (Signed)
3:34 PM Care assumed from Dr. Charm Barges.  At time of transfer care, patient is awaiting for results of delta troponin and reassessment.  Given the patient's symptoms and vital signs with tachypnea, plan is to reassess and discussed with patient if we feel that CT PE study is needed given the pleuritic discomfort although she is on chronic anticoagulation.  Anticipate reassessment.   Patient's work-up revealed negative delta troponin however blood pressure has increased to around 190 systolic.  Had a shared decision-making conversation with patient and family.  Due to the pain going from the chest to the back, we did agree to get a CTA to look for dissection which would also see large blood clots.  CT imaging reassuring in regards to dissection or clot but did show some evidence of chronic or atypical indolent infection in the lungs.  Had a discussion with patient and they agree to follow-up with PCP and pulmonology to discuss if she needs antibiotics or further management for that.  Patient was feeling better on reassessment and will be discharged for outpatient follow-up.   Clinical Impression: 1. Other chest pain   2. Acute thoracic back pain, unspecified back pain laterality   3. Elevated blood pressure reading   4. Lung abnormality     Disposition: Discharge  Condition: Good  I have discussed the results, Dx and Tx plan with the pt(& family if present). He/she/they expressed understanding and agree(s) with the plan. Discharge instructions discussed at great length. Strict return precautions discussed and pt &/or family have verbalized understanding of the instructions. No further questions at time of discharge.    Discharge Medication List as of 06/30/2021  6:32 PM     START taking these medications   Details  cyclobenzaprine (FLEXERIL) 5 MG tablet Take 2 tablets (10 mg total) by mouth 2 (two) times daily as needed for muscle spasms., Starting Wed 06/30/2021, Print    lidocaine  (LIDODERM) 5 % Place 1 patch onto the skin daily. Remove & Discard patch within 12 hours or as directed by MD, Starting Wed 06/30/2021, Print        Follow Up: Andreas Blower., MD [address] [place] [address] [phone]     your pulmonologist        Karyl Sharrar, Canary Brim, MD 07/01/21 [phone]

## 2021-07-21 ENCOUNTER — Inpatient Hospital Stay (HOSPITAL_COMMUNITY): Payer: Medicare HMO

## 2021-07-21 ENCOUNTER — Emergency Department (HOSPITAL_COMMUNITY): Payer: Medicare HMO

## 2021-07-21 ENCOUNTER — Inpatient Hospital Stay (HOSPITAL_COMMUNITY)
Admission: EM | Admit: 2021-07-21 | Discharge: 2021-07-30 | DRG: 286 | Disposition: A | Discharge status: Home / Self Care | Payer: Medicare HMO | Source: Ambulatory Visit | Attending: Internal Medicine | Admitting: Internal Medicine

## 2021-07-21 ENCOUNTER — Encounter (HOSPITAL_COMMUNITY): Payer: Self-pay

## 2021-07-21 ENCOUNTER — Other Ambulatory Visit: Payer: Self-pay

## 2021-07-21 DIAGNOSIS — I6529 Occlusion and stenosis of unspecified carotid artery: Secondary | ICD-10-CM | POA: Diagnosis not present

## 2021-07-21 DIAGNOSIS — I2510 Atherosclerotic heart disease of native coronary artery without angina pectoris: Secondary | ICD-10-CM | POA: Diagnosis present

## 2021-07-21 DIAGNOSIS — J189 Pneumonia, unspecified organism: Secondary | ICD-10-CM | POA: Diagnosis not present

## 2021-07-21 DIAGNOSIS — I110 Hypertensive heart disease with heart failure: Secondary | ICD-10-CM | POA: Diagnosis present

## 2021-07-21 DIAGNOSIS — Z79899 Other long term (current) drug therapy: Secondary | ICD-10-CM | POA: Diagnosis not present

## 2021-07-21 DIAGNOSIS — Z66 Do not resuscitate: Secondary | ICD-10-CM | POA: Diagnosis present

## 2021-07-21 DIAGNOSIS — F39 Unspecified mood [affective] disorder: Secondary | ICD-10-CM | POA: Diagnosis present

## 2021-07-21 DIAGNOSIS — I350 Nonrheumatic aortic (valve) stenosis: Secondary | ICD-10-CM | POA: Diagnosis present

## 2021-07-21 DIAGNOSIS — I739 Peripheral vascular disease, unspecified: Secondary | ICD-10-CM | POA: Diagnosis not present

## 2021-07-21 DIAGNOSIS — E785 Hyperlipidemia, unspecified: Secondary | ICD-10-CM | POA: Diagnosis present

## 2021-07-21 DIAGNOSIS — Z8249 Family history of ischemic heart disease and other diseases of the circulatory system: Secondary | ICD-10-CM | POA: Diagnosis not present

## 2021-07-21 DIAGNOSIS — I3139 Other pericardial effusion (noninflammatory): Principal | ICD-10-CM | POA: Diagnosis present

## 2021-07-21 DIAGNOSIS — I4892 Unspecified atrial flutter: Secondary | ICD-10-CM | POA: Diagnosis not present

## 2021-07-21 DIAGNOSIS — K551 Chronic vascular disorders of intestine: Secondary | ICD-10-CM | POA: Diagnosis present

## 2021-07-21 DIAGNOSIS — I10 Essential (primary) hypertension: Secondary | ICD-10-CM | POA: Diagnosis present

## 2021-07-21 DIAGNOSIS — R6889 Other general symptoms and signs: Secondary | ICD-10-CM | POA: Diagnosis not present

## 2021-07-21 DIAGNOSIS — I272 Pulmonary hypertension, unspecified: Secondary | ICD-10-CM | POA: Diagnosis present

## 2021-07-21 DIAGNOSIS — Z20822 Contact with and (suspected) exposure to covid-19: Secondary | ICD-10-CM | POA: Diagnosis present

## 2021-07-21 DIAGNOSIS — B159 Hepatitis A without hepatic coma: Secondary | ICD-10-CM | POA: Diagnosis present

## 2021-07-21 DIAGNOSIS — I480 Paroxysmal atrial fibrillation: Secondary | ICD-10-CM | POA: Diagnosis not present

## 2021-07-21 DIAGNOSIS — Z8679 Personal history of other diseases of the circulatory system: Secondary | ICD-10-CM

## 2021-07-21 DIAGNOSIS — I779 Disorder of arteries and arterioles, unspecified: Secondary | ICD-10-CM | POA: Diagnosis present

## 2021-07-21 DIAGNOSIS — E871 Hypo-osmolality and hyponatremia: Secondary | ICD-10-CM

## 2021-07-21 DIAGNOSIS — E8770 Fluid overload, unspecified: Secondary | ICD-10-CM | POA: Diagnosis not present

## 2021-07-21 DIAGNOSIS — I6523 Occlusion and stenosis of bilateral carotid arteries: Secondary | ICD-10-CM | POA: Diagnosis present

## 2021-07-21 DIAGNOSIS — I252 Old myocardial infarction: Secondary | ICD-10-CM

## 2021-07-21 DIAGNOSIS — K7469 Other cirrhosis of liver: Secondary | ICD-10-CM | POA: Diagnosis present

## 2021-07-21 DIAGNOSIS — J471 Bronchiectasis with (acute) exacerbation: Secondary | ICD-10-CM | POA: Diagnosis present

## 2021-07-21 DIAGNOSIS — M549 Dorsalgia, unspecified: Secondary | ICD-10-CM | POA: Diagnosis present

## 2021-07-21 DIAGNOSIS — Z91041 Radiographic dye allergy status: Secondary | ICD-10-CM

## 2021-07-21 DIAGNOSIS — R7989 Other specified abnormal findings of blood chemistry: Secondary | ICD-10-CM

## 2021-07-21 DIAGNOSIS — R0602 Shortness of breath: Secondary | ICD-10-CM | POA: Diagnosis present

## 2021-07-21 DIAGNOSIS — Z885 Allergy status to narcotic agent status: Secondary | ICD-10-CM

## 2021-07-21 DIAGNOSIS — I509 Heart failure, unspecified: Secondary | ICD-10-CM | POA: Diagnosis not present

## 2021-07-21 DIAGNOSIS — Z888 Allergy status to other drugs, medicaments and biological substances status: Secondary | ICD-10-CM

## 2021-07-21 DIAGNOSIS — I5033 Acute on chronic diastolic (congestive) heart failure: Secondary | ICD-10-CM | POA: Diagnosis present

## 2021-07-21 DIAGNOSIS — R0603 Acute respiratory distress: Secondary | ICD-10-CM | POA: Diagnosis present

## 2021-07-21 HISTORY — DX: Atherosclerotic heart disease of native coronary artery without angina pectoris: I25.10

## 2021-07-21 HISTORY — DX: Nonrheumatic aortic (valve) stenosis: I35.0

## 2021-07-21 LAB — CBC WITH DIFFERENTIAL/PLATELET
Abs Immature Granulocytes: 0.03 10*3/uL (ref 0.00–0.07)
Basophils Absolute: 0 10*3/uL (ref 0.0–0.1)
Basophils Relative: 1 %
Eosinophils Absolute: 0.1 10*3/uL (ref 0.0–0.5)
Eosinophils Relative: 1 %
HCT: 35.6 % — ABNORMAL LOW (ref 36.0–46.0)
Hemoglobin: 11.5 g/dL — ABNORMAL LOW (ref 12.0–15.0)
Immature Granulocytes: 0 %
Lymphocytes Relative: 21 %
Lymphs Abs: 1.8 10*3/uL (ref 0.7–4.0)
MCH: 29.6 pg (ref 26.0–34.0)
MCHC: 32.3 g/dL (ref 30.0–36.0)
MCV: 91.8 fL (ref 80.0–100.0)
Monocytes Absolute: 0.7 10*3/uL (ref 0.1–1.0)
Monocytes Relative: 8 %
Neutro Abs: 5.8 10*3/uL (ref 1.7–7.7)
Neutrophils Relative %: 69 %
Platelets: 267 10*3/uL (ref 150–400)
RBC: 3.88 MIL/uL (ref 3.87–5.11)
RDW: 13.4 % (ref 11.5–15.5)
WBC: 8.4 10*3/uL (ref 4.0–10.5)
nRBC: 0 % (ref 0.0–0.2)

## 2021-07-21 LAB — TROPONIN I (HIGH SENSITIVITY)
Troponin I (High Sensitivity): 63 ng/L — ABNORMAL HIGH (ref <18)
Troponin I (High Sensitivity): 56 ng/L — ABNORMAL HIGH (ref <18)

## 2021-07-21 LAB — URINALYSIS, ROUTINE W REFLEX MICROSCOPIC
Bilirubin Urine: NEGATIVE
Glucose, UA: NEGATIVE mg/dL
Hgb urine dipstick: NEGATIVE
Ketones, ur: NEGATIVE mg/dL
Leukocytes,Ua: NEGATIVE
Nitrite: NEGATIVE
Protein, ur: NEGATIVE mg/dL
Specific Gravity, Urine: 1.004 — ABNORMAL LOW (ref 1.005–1.030)
pH: 7 (ref 5.0–8.0)

## 2021-07-21 LAB — BRAIN NATRIURETIC PEPTIDE: B Natriuretic Peptide: 201.8 pg/mL — ABNORMAL HIGH (ref 0.0–100.0)

## 2021-07-21 LAB — BLOOD GAS, VENOUS
Acid-Base Excess: 2.3 mmol/L — ABNORMAL HIGH (ref 0.0–2.0)
Bicarbonate: 27.8 mmol/L (ref 20.0–28.0)
O2 Saturation: 44.6 %
Patient temperature: 98.6
pCO2, Ven: 49.8 mmHg (ref 44.0–60.0)
pH, Ven: 7.365 (ref 7.250–7.430)
pO2, Ven: 28.3 mmHg — CL (ref 32.0–45.0)

## 2021-07-21 LAB — COMPREHENSIVE METABOLIC PANEL
ALT: 121 U/L — ABNORMAL HIGH (ref 0–44)
AST: 106 U/L — ABNORMAL HIGH (ref 15–41)
Albumin: 3.5 g/dL (ref 3.5–5.0)
Alkaline Phosphatase: 205 U/L — ABNORMAL HIGH (ref 38–126)
Anion gap: 8 (ref 5–15)
BUN: 20 mg/dL (ref 8–23)
CO2: 27 mmol/L (ref 22–32)
Calcium: 8.9 mg/dL (ref 8.9–10.3)
Chloride: 93 mmol/L — ABNORMAL LOW (ref 98–111)
Creatinine, Ser: 0.93 mg/dL (ref 0.44–1.00)
GFR, Estimated: >60 mL/min (ref >60)
Glucose, Bld: 112 mg/dL — ABNORMAL HIGH (ref 70–99)
Potassium: 4.2 mmol/L (ref 3.5–5.1)
Sodium: 128 mmol/L — ABNORMAL LOW (ref 135–145)
Total Bilirubin: 0.8 mg/dL (ref 0.3–1.2)
Total Protein: 7.1 g/dL (ref 6.5–8.1)

## 2021-07-21 LAB — SODIUM, URINE, RANDOM: Sodium, Ur: 101 mmol/L

## 2021-07-21 LAB — RESP PANEL BY RT-PCR (FLU A&B, COVID) ARPGX2
Influenza A by PCR: NEGATIVE
Influenza B by PCR: NEGATIVE
SARS Coronavirus 2 by RT PCR: NEGATIVE

## 2021-07-21 LAB — ACETAMINOPHEN LEVEL: Acetaminophen (Tylenol), Serum: 20 ug/mL (ref 10–30)

## 2021-07-21 LAB — MAGNESIUM: Magnesium: 2.1 mg/dL (ref 1.7–2.4)

## 2021-07-21 LAB — PROTIME-INR
INR: 1 (ref 0.8–1.2)
Prothrombin Time: 12.8 seconds (ref 11.4–15.2)

## 2021-07-21 LAB — D-DIMER, QUANTITATIVE: D-Dimer, Quant: 1.58 ug/mL-FEU — ABNORMAL HIGH (ref 0.00–0.50)

## 2021-07-21 LAB — CREATININE, URINE, RANDOM: Creatinine, Urine: <10 mg/dL

## 2021-07-21 MED ORDER — LIDOCAINE 5 % EX PTCH
1.0000 | MEDICATED_PATCH | CUTANEOUS | Status: DC
  Administered 2021-07-21 – 2021-07-29 (×9): 1 via TRANSDERMAL
  Filled 2021-07-21 (×9): qty 1

## 2021-07-21 MED ORDER — ALBUTEROL SULFATE HFA 108 (90 BASE) MCG/ACT IN AERS
2.0000 | INHALATION_SPRAY | RESPIRATORY_TRACT | Status: DC | PRN
  Filled 2021-07-21: qty 6.7

## 2021-07-21 MED ORDER — NITROGLYCERIN IN D5W 200-5 MCG/ML-% IV SOLN
0.0000 ug/min | INTRAVENOUS | Status: DC
  Filled 2021-07-21: qty 250

## 2021-07-21 MED ORDER — FUROSEMIDE 10 MG/ML IJ SOLN
40.0000 mg | Freq: Once | INTRAMUSCULAR | Status: AC
  Administered 2021-07-21: 21:00:00 40 mg via INTRAVENOUS
  Filled 2021-07-21: qty 4

## 2021-07-21 NOTE — H&P (Signed)
Barbara Mcconnell AST:[id number] DOB: 06-Oct-1934 DOA: 07/21/2021    PCP: Kristopher Glee., MD   Outpatient Specialists:   CARDS:  Dr. Martinique   Pulmonary   Dr. Josefina Do     Patient arrived to ER on 07/21/21 at 1801 Referred by Attending Godfrey Pick, MD   Patient coming from: home Lives alone,    Chief Complaint:   Chief Complaint  Patient presents with   Shortness of Breath   Hypertension    HPI: Barbara Mcconnell is a 86 y.o. female with medical history significant of MAC, HTN, HLD  Takotsubo cardiomyopathy  Presented with  increased work of breathing With severe SOB for few weeks, was seen by pulmonology Has been treated for MAC in the past and restarted her on Azythro and added LAsix Was seen by PCP no hypoxia but given tachypnea was sent to ER   Have not been on diuretic in the past  CTA 07/18/21 no PE scattered tree-in-bud nodular  densities likely reflecting chronic/indolent infection, scarring in  the right middle lobe and lingula, and trace left pleural effusion.    reports orthopnea, lightheadedness,   When her medicines were doubled in the beginning of the month she started to feel more dizzy  She has been having back pain and has been taking tylenol  throughout the day max 6 pill per day Back pain she attributes to moving heavy pots States she have had 2 episodes of Takotsubo cardiomyopathy and recovered from both Reports that she had worked really hard prior to Christmas and then became extremely fatigued  Has   been vaccinated against COVID and boosted had   flu shot   Initial COVID TEST  NEGATIVE   Lab Results  Component Value Date   [place] 07/21/2021   [place] NEGATIVE 05/13/2019     Regarding pertinent Chronic problems:     Hyperlipidemia - on statins Crestor     HTN on lisinopril, metoprololol   chronic CHF diastolic/systolic/ combined - last   Echo March 2021 has completely normalized c/w resolved Takotsubo's  cardiomyopathy. moderate- severe AS.       While in ER:    Bedside echo showed worsening pericardial effusion Called Cardiology who rec admitting to [place] - Cardiomegaly with vascular congestion and small bilateral pleural effusions    Following Medications were ordered in ER: Medications  albuterol (VENTOLIN HFA) 108 (90 Base) MCG/ACT inhaler 2 puff (has no administration in time range)  lidocaine (LIDODERM) 5 % 1 patch (1 patch Transdermal Patch Applied 07/21/21 2044)  furosemide (LASIX) injection 40 mg (40 mg Intravenous Given 07/21/21 2116)    _______________________________________________________ ER Provider Called:  Cardiology   Dr.O'neal They Recommend admit to medicine   Will see In AM    ED Triage Vitals [07/21/21 1828]  Enc Vitals Group     BP (!) 201/109     Pulse Rate (!) 110     Resp (!) 30     Temp 98.2 F (36.8 C)     Temp Source Oral     SpO2 98 %     Weight      Height      Head Circumference      Peak Flow      Pain Score      Pain Loc      Pain Edu?      Excl. in [place]?   LGXQ(11)@     _________________________________________ Significant initial  Findings: Abnormal Labs Reviewed  D-DIMER, QUANTITATIVE - Abnormal; Notable for the following components:      Result Value   D-Dimer, Quant 1.58 (*)    All other components within normal limits  BRAIN NATRIURETIC PEPTIDE - Abnormal; Notable for the following components:   B Natriuretic Peptide 201.8 (*)    All other components within normal limits  CBC WITH DIFFERENTIAL/PLATELET - Abnormal; Notable for the following components:   Hemoglobin 11.5 (*)    HCT 35.6 (*)    All other components within normal limits  COMPREHENSIVE METABOLIC PANEL - Abnormal; Notable for the following components:   Sodium 128 (*)    Chloride 93 (*)    Glucose, Bld 112 (*)    AST 106 (*)    ALT 121 (*)    Alkaline Phosphatase 205 (*)    All other components within normal limits  BLOOD GAS, VENOUS - Abnormal;  Notable for the following components:   pO2, Ven 28.3 (*)    Acid-Base Excess 2.3 (*)    All other components within normal limits  TROPONIN I (HIGH SENSITIVITY) - Abnormal; Notable for the following components:   Troponin I (High Sensitivity) 63 (*)    All other components within normal limits     _________________________ Troponin  63 ECG: Ordered Personally reviewed by me showing: HR : 99 Rhythm: NSR,    no evidence of ischemic changes QTC 402     The recent clinical data is shown below. Vitals:   07/21/21 1830 07/21/21 1900 07/21/21 2003 07/21/21 2032  BP:  (!) 146/100  (!) 177/95  Pulse: (!) 103 97 (!) 101 98  Resp: (!) 38 (!) 29 (!) 34 19  Temp:      TempSrc:      SpO2: 100% 97% 97% 97%     WBC     Component Value Date/Time   WBC 8.4 07/21/2021 1948   LYMPHSABS 1.8 07/21/2021 1948   MONOABS 0.7 07/21/2021 1948   EOSABS 0.1 07/21/2021 1948   BASOSABS 0.0 07/21/2021 1948     Lactic Acid, Venous    Component Value Date/Time   LATICACIDVEN 0.9 07/22/2021 0022     UA  no evidence of UTI      Urine analysis:    Component Value Date/Time   COLORURINE COLORLESS (A) 07/21/2021 2240   APPEARANCEUR CLEAR 07/21/2021 2240   LABSPEC 1.004 (L) 07/21/2021 2240   PHURINE 7.0 07/21/2021 2240   GLUCOSEU NEGATIVE 07/21/2021 2240   HGBUR NEGATIVE 07/21/2021 2240   BILIRUBINUR NEGATIVE 07/21/2021 2240   KETONESUR NEGATIVE 07/21/2021 2240   PROTEINUR NEGATIVE 07/21/2021 2240   NITRITE NEGATIVE 07/21/2021 2240   LEUKOCYTESUR NEGATIVE 07/21/2021 2240    Results for orders placed or performed during the hospital encounter of 07/21/21  Resp Panel by RT-PCR (Flu A&B, Covid) Nasopharyngeal Swab     Status: None   Collection Time: 07/21/21  7:48 PM   Specimen: Nasopharyngeal Swab; Nasopharyngeal(NP) swabs in vial transport medium  Result Value Ref Range Status   SARS Coronavirus 2 by RT PCR NEGATIVE NEGATIVE Final         Influenza A by PCR NEGATIVE NEGATIVE Final    Influenza B by PCR NEGATIVE NEGATIVE Final           _______________________________________________ Hospitalist was called for admission for  CHF exacerbation  The following Work up has been ordered so far:  Orders Placed This Encounter  Procedures   Resp Panel by RT-PCR (Flu A&B, Covid) Nasopharyngeal Swab   DG Chest  2 View   D-dimer, quantitative   Brain natriuretic peptide   CBC WITH DIFFERENTIAL   Comprehensive metabolic panel   Magnesium   Blood gas, venous   Cardiac monitoring   Utilize spacer/aerochamber with mdi inhaler for COVID-19 positive patients or PUI for COVID-19   If O2 Sat <94% administer O2 at 2 liters/minute via nasal cannula   Initiate Carrier Fluid Protocol   Consult to hospitalist   Pulse oximetry, continuous   Adult wheeze protocol - initiate by RT   ED EKG   ECHOCARDIOGRAM COMPLETE     OTHER Significant initial  Findings:  labs showing:    Recent Labs  Lab 07/21/21 1948  NA 128*  K 4.2  CO2 27  GLUCOSE 112*  BUN 20  CREATININE 0.93  CALCIUM 8.9  MG 2.1    Cr  stable  Lab Results  Component Value Date   CREATININE 0.93 07/21/2021   CREATININE 0.87 06/30/2021   CREATININE 0.94 05/31/2019    Recent Labs  Lab 07/21/21 1948  AST 106*  ALT 121*  ALKPHOS 205*  BILITOT 0.8  PROT 7.1  ALBUMIN 3.5   Lab Results  Component Value Date   CALCIUM 8.9 07/21/2021        Plt: Lab Results  Component Value Date   PLT 267 07/21/2021      COVID-19 Labs  Recent Labs    07/21/21 1948  DDIMER 1.58*    Lab Results  Component Value Date   SARSCOV2NAA NEGATIVE 07/21/2021   [place] NEGATIVE 05/13/2019        Recent Labs  Lab 07/21/21 1948  WBC 8.4  NEUTROABS 5.8  HGB 11.5*  HCT 35.6*  MCV 91.8  PLT 267    HG/HCT  stable,      Component Value Date/Time   HGB 11.5 (L) 07/21/2021 1948   HCT 35.6 (L) 07/21/2021 1948   MCV 91.8 07/21/2021 1948      Cardiac Panel (last 3 results) Recent Labs    07/22/21 0022   CKTOTAL 49    .car BNP (last 3 results) Recent Labs    06/30/21 1323 07/21/21 1948  BNP 350.9* 201.8*      Cultures: No results found for: SDES, SPECREQUEST, CULT, REPTSTATUS   Radiological Exams on Admission: DG Chest 2 View  Result Date: 07/21/2021 CLINICAL DATA:  Shortness of breath. EXAM: CHEST - 2 VIEW COMPARISON:  Chest radiograph dated 07/18/2021. FINDINGS: Cardiomegaly with vascular congestion. Probable small bilateral pleural effusions and bibasilar atelectasis or infiltrate. No pneumothorax. Atherosclerotic calcification of the aorta. No acute osseous pathology. IMPRESSION: Cardiomegaly with vascular congestion and small bilateral pleural effusions. Electronically Signed   By: Anner Crete M.D.   On: 07/21/2021 19:28   US Abdomen Limited RUQ (LIVER/GB)  Result Date: 07/21/2021 CLINICAL DATA:  Elevated LFTs. EXAM: ULTRASOUND ABDOMEN LIMITED RIGHT UPPER QUADRANT COMPARISON:  CT dated 05/13/2019. FINDINGS: Gallbladder: No gallstones or wall thickening visualized. No sonographic Murphy sign noted by sonographer. Common bile duct: Diameter: 5 mm Liver: The liver demonstrates a coarsened echotexture with normal echogenicity. There is slight irregularity of the liver contour. Clinical correlation is recommended to evaluate for early changes of cirrhosis. Portal vein is patent on color Doppler imaging with normal direction of blood flow towards the liver. Other: None. IMPRESSION: Coarsened liver echotexture, otherwise unremarkable right upper quadrant ultrasound. Electronically Signed   By: Anner Crete M.D.   On: 07/21/2021 23:52   _______________________________________________________________________________________________________ Latest   Blood pressure (!) 177/95, pulse 98, temperature  98.2 F (36.8 C), temperature source Oral, resp. rate 19, SpO2 97 %.   Vitals  labs and radiology finding personally reviewed  Review of Systems:    Pertinent positives include:    fatigue, Orthopnea, PND,  dizziness, shortness of breath at rest.  dyspnea on exertion,  Constitutional:  No weight loss, night sweats, Fevers, chills,weight loss  HEENT:  No headaches, Difficulty swallowing,Tooth/dental problems,Sore throat,  No sneezing, itching, ear ache, nasal congestion, post nasal drip,  Cardio-vascular:  No chest pain, anasarca, palpitations.no Bilateral lower extremity swelling  GI:  No heartburn, indigestion, abdominal pain, nausea, vomiting, diarrhea, change in bowel habits, loss of appetite, melena, blood in stool, hematemesis Resp:  No excess mucus, no productive cough, No non-productive cough, No coughing up of blood.No change in color of mucus.No wheezing. Skin:  no rash or lesions. No jaundice GU:  no dysuria, change in color of urine, no urgency or frequency. No straining to urinate.  No flank pain.  Musculoskeletal:  No joint pain or no joint swelling. No decreased range of motion. No back pain.  Psych:  No change in mood or affect. No depression or anxiety. No memory loss.  Neuro: no localizing neurological complaints, no tingling, no weakness, no double vision, no gait abnormality, no slurred speech, no confusion  All systems reviewed and apart from [place] all are negative _______________________________________________________________________________________________ Past Medical History:   Past Medical History:  Diagnosis Date   Coronary artery disease    Hyperlipemia    Hypertension    Takotsubo cardiomyopathy       Past Surgical History:  Procedure Laterality Date   RIGHT/LEFT HEART CATH AND CORONARY ANGIOGRAPHY N/A 05/13/2019   Procedure: RIGHT/LEFT HEART CATH AND CORONARY ANGIOGRAPHY;  Surgeon: Martinique, Peter M, MD;  Location: [facility];  Service: Cardiovascular;  Laterality: N/A;   TONSILLECTOMY      Social History:  Ambulatory  independently     reports that she has never smoked. She has never used smokeless tobacco. She  reports that she does not drink alcohol and does not use drugs.     Family History:   Family History  Problem Relation Age of Onset   Hypertension Other    ______________________________________________________________________________________________ Allergies: Allergies  Allergen Reactions   Codeine Nausea And Vomiting and Nausea Only   Cyclobenzaprine    Iodinated Contrast Media    Prednisone Other (See Comments)    Sores in mouth     Prior to Admission medications   Medication Sig Start Date End Date Taking? Authorizing Provider  albuterol (VENTOLIN HFA) 108 (90 Base) MCG/ACT inhaler Inhale 2 puffs into the lungs every 4 (four) hours as needed for wheezing or shortness of breath (seasonal allergies).  02/04/19   [provider]  cetirizine (ZYRTEC) 10 MG tablet Take 10 mg by mouth daily with breakfast.    [provider]  Cholecalciferol (VITAMIN D3 PO) Take 1 tablet by mouth daily.    [provider]  cyclobenzaprine (FLEXERIL) 5 MG tablet Take 2 tablets (10 mg total) by mouth 2 (two) times daily as needed for muscle spasms. 06/30/21   Tegeler, Gwenyth Allegra, MD  docusate sodium (COLACE) 100 MG capsule Take 100 mg by mouth daily with breakfast.    [provider]  escitalopram (LEXAPRO) 5 mg TABS tablet  06/07/19   [provider]  lidocaine (LIDODERM) 5 % Place 1 patch onto the skin daily. Remove & Discard patch within 12 hours or as directed by MD 06/30/21  Tegeler, Gwenyth Allegra, MD  [facility] EX) Place 1 patch onto the skin daily as needed (knee pain/ shoulder pain).    [provider]  lisinopril (ZESTRIL) 10 MG tablet Take 25 mg by mouth daily. 06/15/21   [provider]  metoprolol succinate (TOPROL XL) 25 MG 24 hr tablet Take 1 tablet (25 mg total) by mouth daily. 07/29/19   Martinique, Peter M, MD  montelukast (SINGULAIR) 10 MG tablet Take 10 mg by mouth daily after supper. 12/17/18   [provider]  Multiple Vitamin (MULTIVITAMIN WITH MINERALS) TABS tablet Take 1 tablet by mouth daily. Centrum 50 plus    [provider]  Multiple Vitamins-Minerals (EMERGEN-C VITAMIN C) PACK Take 1 packet by mouth daily with lunch.    [provider]  Omega-3 Fatty Acids (FISH OIL PO) Take 1 capsule by mouth daily.     [provider]  omeprazole (PRILOSEC) 20 MG capsule Take 20 mg by mouth daily before breakfast. 03/19/19   [provider]  rosuvastatin (CRESTOR) 5 MG tablet Take by mouth. 06/24/21 09/22/21  [provider]  Trolamine Salicylate (ASPERCREME EX) Apply 1 application topically daily as needed (knee pain).    [provider]    ___________________________________________________________________________________________________ Physical Exam: Vitals with BMI 07/21/2021 07/21/2021 07/21/2021  Height - - -  Weight - - -  BMI - - -  Systolic 076 - 226  Diastolic 95 - 333  Pulse 98 101 97     1. General:  in No  Acute distress    Chronically ill  -appearing 2. Psychological: Alert and   Oriented 3. Head/ENT:   Moist  Mucous Membranes                          Head Non traumatic, neck supple                          Poor Dentition 4. SKIN: normal  Skin turgor,  Skin clean Dry and intact no rash 5. Heart: Regular rate and rhythm systolic Murmur, no Rub or gallop 6. Lungs:   no wheezes  some  crackles   7. Abdomen: Soft,  non-tender, Non distended  bowel sounds present 8. Lower extremities: no clubbing, cyanosis, trace edema 9. Neurologically Grossly intact, moving all 4 extremities equally   10. MSK: Normal range of motion    Chart has been reviewed  ______________________________________________________________________________________________  Assessment/Plan 86 y.o. female with medical history significant of MAC, HTN, HLD  Takotsubo cardiomyopathy   Admitted for CHF exacerbation and worsening pericardiac effusion   Present  on Admission:  Pericardial effusion  Essential hypertension  Dyslipidemia, goal LDL below 70  Non-ST elevation (NSTEMI) myocardial infarction ([place])  Hypertension  Fluid overload  Bronchiectasis with acute exacerbation (HCC)     Essential hypertension Continue lisinopril  Dyslipidemia, goal LDL below 70 Hold Crestor given elevated LFTs  Aortic stenosis Echogram appreciate cardiology input   Pericardial effusion Chest x-ray showing potentially worsening pericardial effusion Bedside echogram was done by ER provider which confirmed pericardial effusion Cardiology was consulted and recommended diuresis and admitted to [facility] repeat echogram in a.m.  - Will monitor for any evidence of tamponade N.p.o. postmidnight in case needs procedure Avoid over aggressive fluid shifts for today appreciate cardiology consult  Hypertension Restart lisinopril changed Toprol to metoprolol twice daily For better titration  Fluid overload Received a dose of Lasix in the  emergency department good diuresis Obtain Avoid over aggressive fluid shifts given worsening pericardial effusion Appreciate cardiology consult in a.m.  Carotid arterial disease (HCC) Based on CT scan results.  In the past cardiac catheterization showed no significant CAD.  Appreciate cardiology input. Resume home medications when able to tolerate currently no chest pain Slightly elevated troponin stable flAT  Bronchiectasis with acute exacerbation (HCC) History of MAC.  Bronchiectasis may be contributing to dyspnea.  Would benefit from pulmonology consult in a.m. please consult continue azithromycin for now would defer to pulmonology for further treatment  Other cirrhosis of liver ([place]) Right upper quadrant ultrasound showed possible early cirrhosis Could be secondary to underlying cardiogenic fluid overload. Noted elevated LFTs.  Obtain hepatitis serologies would likely benefit from further GI evaluation   Other plan  as per orders.  DVT prophylaxis:  SCD   Code Status:  DNR/DNI  as per patient   I had personally discussed CODE STATUS with patient     Family Communication:   Family  at  Bedside  plan of care was discussed   with  Daughter   Disposition Plan:     To home once workup is complete and patient is stable   Following barriers for discharge:                            Electrolytes corrected                                                         Will likely need home health, home O2, set up                           Will need consultants to evaluate patient prior to discharge    Would benefit from PT/OT eval prior to DC  Ordered                                        Consults called: cARDIOLOYG NOTIFIED BY er AND I SENT EMAIL, Please consult pulmonology in AM Sent msg to [facility]  Admission status:  ED Disposition     ED Disposition  [place]: [place] [[id number]]  Level of Care: Progressive [102]  Admit to Progressive based on following criteria: CARDIOVASCULAR & THORACIC of moderate stability with acute coronary syndrome symptoms/low risk myocardial infarction/hypertensive urgency/arrhythmias/heart failure potentially compromising stability and stable post cardiovascular intervention patients.  May admit patient to [facility] or [facility] if equivalent level of care is available:: No  Covid Evaluation: Confirmed COVID Negative  Diagnosis: Pericardial effusion [810175]  Admitting Physician: Toy Baker [[id number]]  Attending Physician: Toy Baker [[id number]]  Estimated length of stay: past midnight tomorrow  Certification:: I certify this patient will need inpatient services for at least 2 midnights          inpatient     I Expect 2 midnight stay secondary to severity of patient's current illness need for inpatient interventions justified by the following:  hemodynamic instability despite optimal treatment  (hypertention )  Severe lab/radiological/exam abnormalities including:    Pericardial effusion  and extensive comorbidities including:  CHF  CAD   That are currently affecting medical management.   I expect  patient to be hospitalized for 2 midnights requiring inpatient medical care.  Patient is at high risk for adverse outcome (such as loss of life or disability) if not treated.  Indication for inpatient stay as follows:   Need for operative/procedural  intervention      Level of care       progressive tele indefinitely please discontinue once patient no longer qualifies COVID-19 Labs    Lab Results  Component Value Date   [place] 07/21/2021     Precautions: admitted as   Covid Negative      after 2 AM please page floor coverage PA If 7AM-7PM, please contact the day team taking care of the patient using [email or web address]   Patient was evaluated in the context of the global COVID-19 pandemic, which necessitated consideration that the patient might be at risk for infection with the SARS-CoV-2 virus that causes COVID-19. Institutional protocols and algorithms that pertain to the evaluation of patients at risk for COVID-19 are in a state of rapid change based on information released by regulatory bodies including the CDC and federal and state organizations. These policies and algorithms were followed during the patient's care.

## 2021-07-21 NOTE — ED Provider Notes (Signed)
[facility] DEPT Provider Note   CSN: [id number] Arrival date & time: 07/21/21  1801     History Chief Complaint  Patient presents with   Shortness of Breath   Hypertension    Barbara Mcconnell is a 86 y.o. female.   Shortness of Breath Associated symptoms: no abdominal pain, no chest pain, no cough, no ear pain, no fever, no headaches, no neck pain, no rash, no sore throat, no vomiting and no wheezing   Hypertension Associated symptoms include shortness of breath. Pertinent negatives include no chest pain, no abdominal pain and no headaches.  Patient presents for shortness of breath.  Medical history is as follows: She was diagnosed with an atypical lung infection over the summer.  She underwent long-term antibiotics.  Over the past several weeks, she has had fatigue, shortness of breath, exercise intolerance, and orthopnea.  The symptoms have acutely worsened over the past several days.  She was seen by pulmonology yesterday and started on azithromycin and Lasix.  She followed up with her primary care doctor today.  In the office, she was noted to have tachypnea and increased work of breathing.  She was advised to come to the ED for further evaluation and likely admission.  Per chart review, patient underwent a CTA of chest 2 days ago.  Results were negative for PE.  She did have cardiomegaly and a small pericardial effusion.  There were chronic changes and a trace left pleural effusion.  Patient currently endorses continued shortness of breath, even at rest.  She has an area of discomfort in the right subscapular area of her back.  She denies any other areas of discomfort.  She has no known history of CHF or reactive airway disease.    Past Medical History:  Diagnosis Date   Coronary artery disease    Hyperlipemia    Hypertension    Takotsubo cardiomyopathy     Patient Active Problem List   Diagnosis Date Noted   Pericardial effusion 07/21/2021    Hypertension    Aortic stenosis 05/31/2019   Renal insufficiency 05/31/2019   CAD (coronary artery disease) 05/31/2019   Non-ST elevation (NSTEMI) myocardial infarction ([place]) 05/13/2019   Essential hypertension 05/13/2019   Dyslipidemia, goal LDL below 70 05/13/2019   Carotid arterial disease ([place]) 05/13/2019   Takotsubo cardiomyopathy     Past Surgical History:  Procedure Laterality Date   RIGHT/LEFT HEART CATH AND CORONARY ANGIOGRAPHY N/A 05/13/2019   Procedure: RIGHT/LEFT HEART CATH AND CORONARY ANGIOGRAPHY;  Surgeon: Martinique, Peter M, MD;  Location: [facility];  Service: Cardiovascular;  Laterality: N/A;   TONSILLECTOMY       OB History   No obstetric history on file.     History reviewed. No pertinent family history.  Social History   Tobacco Use   Smoking status: Never   Smokeless tobacco: Never  Vaping Use   Vaping Use: Never used  Substance Use Topics   Alcohol use: No   Drug use: No    Home Medications Prior to Admission medications   Medication Sig Start Date End Date Taking? Authorizing Provider  acetaminophen (TYLENOL) 500 MG tablet Take 500-1,000 mg by mouth every 6 (six) hours as needed for moderate pain.   Yes [provider]  albuterol (VENTOLIN HFA) 108 (90 Base) MCG/ACT inhaler Inhale 2 puffs into the lungs every 4 (four) hours as needed for wheezing or shortness of breath (seasonal allergies).  02/04/19  Yes [provider]  azithromycin Azucena Fallen)  250 MG tablet Take 250 mg by mouth daily. 07/20/21  Yes [provider]  cetirizine (ZYRTEC) 10 MG tablet Take 10 mg by mouth daily with breakfast.   Yes [provider]  Cholecalciferol (VITAMIN D3 PO) Take 1 tablet by mouth daily.   Yes [provider]  docusate sodium (COLACE) 100 MG capsule Take 100 mg by mouth daily with breakfast.   Yes [provider]  escitalopram (LEXAPRO) 5 mg TABS tablet Take 5 mg by mouth daily. 06/07/19  Yes [provider]  furosemide (LASIX) 20 MG tablet Take 20 mg by mouth daily. 07/20/21  Yes [provider]  lidocaine (LIDODERM) 5 % Place 1 patch onto the skin daily. Remove & Discard patch within 12 hours or as directed by MD 06/30/21  Yes Tegeler, Gwenyth Allegra, MD  lisinopril (ZESTRIL) 10 MG tablet Take 25 mg by mouth daily. 06/15/21  Yes [provider]  metoprolol succinate (TOPROL-XL) 25 MG 24 hr tablet Take 1 tablet (25 mg total) by mouth daily. 07/29/19  Yes Martinique, Peter M, MD  montelukast (SINGULAIR) 10 MG tablet Take 10 mg by mouth daily after supper. 12/17/18  Yes [provider]  Multiple Vitamin (MULTIVITAMIN WITH MINERALS) TABS tablet Take 1 tablet by mouth daily. Centrum 50 plus   Yes [provider]  Multiple Vitamins-Minerals (EMERGEN-C VITAMIN C) PACK Take 1 packet by mouth daily with lunch.   Yes [provider]  Omega-3 Fatty Acids (FISH OIL PO) Take 1 capsule by mouth daily.    Yes [provider]  omeprazole (PRILOSEC) 20 MG capsule Take 20 mg by mouth daily before breakfast. 03/19/19  Yes [provider]  rosuvastatin (CRESTOR) 5 MG tablet Take 5 mg by mouth every evening. 06/24/21 09/22/21 Yes [provider]  Trolamine Salicylate (ASPERCREME EX) Apply 1 application topically daily as needed (knee pain).   Yes [provider]  cyclobenzaprine (FLEXERIL) 5 MG tablet Take 2 tablets (10 mg total) by mouth 2 (two) times daily as needed for muscle spasms. Patient not taking: Reported on 07/21/2021 06/30/21   Tegeler, Gwenyth Allegra, MD    Allergies    Codeine, Cyclobenzaprine, Iodinated contrast media, and Prednisone  Review of Systems   Review of Systems  Constitutional:  Positive for activity change, appetite change and fatigue. Negative for chills and fever.  HENT:  Negative for congestion, ear pain and sore throat.   Eyes:  Negative for pain and visual disturbance.  Respiratory:  Positive for  shortness of breath. Negative for cough, chest tightness and wheezing.   Cardiovascular:  Negative for chest pain, palpitations and leg swelling.  Gastrointestinal:  Negative for abdominal pain, diarrhea, nausea and vomiting.  Genitourinary:  Negative for dysuria, flank pain, hematuria and pelvic pain.  Musculoskeletal:  Positive for back pain. Negative for arthralgias, joint swelling, myalgias and neck pain.  Skin:  Negative for color change and rash.  Neurological:  Negative for dizziness, seizures, syncope, speech difficulty, weakness, light-headedness, numbness and headaches.  Hematological:  Does not bruise/bleed easily.  Psychiatric/Behavioral:  Negative for confusion and decreased concentration.   All other systems reviewed and are negative.  Physical Exam Updated Vital Signs BP 111/69    Pulse 100    Temp 98.2 F (36.8 C) (Oral)    Resp (!) 27    SpO2 93%   Physical Exam Vitals and nursing note reviewed.  Constitutional:      General: She is not in acute distress.    Appearance:  She is well-developed and normal weight. She is ill-appearing. She is not toxic-appearing.  HENT:     Head: Normocephalic and atraumatic.     Mouth/Throat:     Mouth: Mucous membranes are moist.     Pharynx: Oropharynx is clear.  Eyes:     Extraocular Movements: Extraocular movements intact.     Conjunctiva/sclera: Conjunctivae normal.  Neck:     Vascular: JVD present.  Cardiovascular:     Rate and Rhythm: Normal rate and regular rhythm.     Heart sounds: No murmur heard. Pulmonary:     Effort: Tachypnea, accessory muscle usage and respiratory distress present.     Breath sounds: Normal breath sounds. No decreased air movement. No decreased breath sounds, wheezing, rhonchi or rales.  Chest:     Chest wall: No tenderness.  Abdominal:     Palpations: Abdomen is soft.     Tenderness: There is no abdominal tenderness.  Musculoskeletal:        General: No swelling.     Cervical back: Neck  supple.     Right lower leg: No edema.     Left lower leg: No edema.  Skin:    General: Skin is warm and dry.     Capillary Refill: Capillary refill takes less than 2 seconds.  Neurological:     General: No focal deficit present.     Mental Status: She is alert and oriented to person, place, and time.     Cranial Nerves: No cranial nerve deficit.     Motor: No weakness.  Psychiatric:        Mood and Affect: Mood normal.        Behavior: Behavior normal.    ED Results / Procedures / Treatments   Labs (all labs ordered are listed, but only abnormal results are displayed) Labs Reviewed  D-DIMER, QUANTITATIVE - Abnormal; Notable for the following components:      Result Value   D-Dimer, Quant 1.58 (*)    All other components within normal limits  BRAIN NATRIURETIC PEPTIDE - Abnormal; Notable for the following components:   B Natriuretic Peptide 201.8 (*)    All other components within normal limits  CBC WITH DIFFERENTIAL/PLATELET - Abnormal; Notable for the following components:   Hemoglobin 11.5 (*)    HCT 35.6 (*)    All other components within normal limits  COMPREHENSIVE METABOLIC PANEL - Abnormal; Notable for the following components:   Sodium 128 (*)    Chloride 93 (*)    Glucose, Bld 112 (*)    AST 106 (*)    ALT 121 (*)    Alkaline Phosphatase 205 (*)    All other components within normal limits  BLOOD GAS, VENOUS - Abnormal; Notable for the following components:   pO2, Ven 28.3 (*)    Acid-Base Excess 2.3 (*)    All other components within normal limits  OSMOLALITY, URINE - Abnormal; Notable for the following components:   Osmolality, Ur 246 (*)    All other components within normal limits  URINALYSIS, ROUTINE W REFLEX MICROSCOPIC - Abnormal; Notable for the following components:   Color, Urine COLORLESS (*)    Specific Gravity, Urine 1.004 (*)    All other components within normal limits  TROPONIN I (HIGH SENSITIVITY) - Abnormal; Notable for the following  components:   Troponin I (High Sensitivity) 63 (*)    All other components within normal limits  TROPONIN I (HIGH SENSITIVITY) - Abnormal; Notable for the following components:  Troponin I (High Sensitivity) 56 (*)    All other components within normal limits  TROPONIN I (HIGH SENSITIVITY) - Abnormal; Notable for the following components:   Troponin I (High Sensitivity) 51 (*)    All other components within normal limits  RESP PANEL BY RT-PCR (FLU A&B, COVID) ARPGX2  MAGNESIUM  CK  PHOSPHORUS  CREATININE, URINE, RANDOM  TSH  SODIUM, URINE, RANDOM  ACETAMINOPHEN LEVEL  PROTIME-INR  LACTIC ACID, PLASMA  OSMOLALITY  LACTIC ACID, PLASMA  MAGNESIUM  PHOSPHORUS  CBC WITH DIFFERENTIAL/PLATELET  TSH  COMPREHENSIVE METABOLIC PANEL  TROPONIN I (HIGH SENSITIVITY)    EKG EKG Interpretation  Date/Time:  Wednesday July 21 2021 19:59:39 EST Ventricular Rate:  99 PR Interval:  134 QRS Duration: 100 QT Interval:  313 QTC Calculation: 402 R Axis:   55 Text Interpretation: Sinus tachycardia Atrial premature complexes Probable left atrial enlargement Nonspecific T abnormalities, lateral leads Confirmed by Gloris Manchester [phone]) on 07/21/2021 8:13:24 PM  Radiology DG Chest 2 View  Result Date: 07/21/2021 CLINICAL DATA:  Shortness of breath. EXAM: CHEST - 2 VIEW COMPARISON:  Chest radiograph dated 07/18/2021. FINDINGS: Cardiomegaly with vascular congestion. Probable small bilateral pleural effusions and bibasilar atelectasis or infiltrate. No pneumothorax. Atherosclerotic calcification of the aorta. No acute osseous pathology. IMPRESSION: Cardiomegaly with vascular congestion and small bilateral pleural effusions. Electronically Signed   By: Elgie Collard M.D.   On: 07/21/2021 19:28   US Abdomen Limited RUQ (LIVER/GB)  Result Date: 07/21/2021 CLINICAL DATA:  Elevated LFTs. EXAM: ULTRASOUND ABDOMEN LIMITED RIGHT UPPER QUADRANT COMPARISON:  CT dated 05/13/2019. FINDINGS: Gallbladder: No  gallstones or wall thickening visualized. No sonographic Murphy sign noted by sonographer. Common bile duct: Diameter: 5 mm Liver: The liver demonstrates a coarsened echotexture with normal echogenicity. There is slight irregularity of the liver contour. Clinical correlation is recommended to evaluate for early changes of cirrhosis. Portal vein is patent on color Doppler imaging with normal direction of blood flow towards the liver. Other: None. IMPRESSION: Coarsened liver echotexture, otherwise unremarkable right upper quadrant ultrasound. Electronically Signed   By: Elgie Collard M.D.   On: 07/21/2021 23:52    Procedures Procedures   Medications Ordered in ED Medications  albuterol (VENTOLIN HFA) 108 (90 Base) MCG/ACT inhaler 2 puff (has no administration in time range)  lidocaine (LIDODERM) 5 % 1 patch (1 patch Transdermal Patch Applied 07/21/21 2044)  azithromycin (ZITHROMAX) tablet 250 mg (has no administration in time range)  escitalopram (LEXAPRO) tablet 5 mg (has no administration in time range)  lisinopril (ZESTRIL) tablet 25 mg (has no administration in time range)  metoprolol tartrate (LOPRESSOR) tablet 12.5 mg (12.5 mg Oral Given 07/22/21 0125)  montelukast (SINGULAIR) tablet 10 mg (has no administration in time range)  sodium chloride flush (NS) 0.9 % injection 3 mL (3 mLs Intravenous Given 07/22/21 0116)  sodium chloride flush (NS) 0.9 % injection 3 mL (has no administration in time range)  0.9 %  sodium chloride infusion (has no administration in time range)  furosemide (LASIX) injection 40 mg (40 mg Intravenous Given 07/21/21 2116)    ED Course  I have reviewed the triage vital signs and the nursing notes.  Pertinent labs & imaging results that were available during my care of the patient were reviewed by me and considered in my medical decision making (see chart for details).    MDM Rules/Calculators/A&P  Patient is a pleasant 86 year old female  with no known history of CHF or asthma/COPD, presenting for several weeks of shortness of breath, exercise intolerance, and orthopnea.  The symptoms have worsened over the past several days.  She has undergone outpatient work-up through her pulmonologist as well as her primary care doctor.  She had a CTA of chest 2 days ago that was negative for PE.  She was started on Lasix 2 days ago, 20 mg/day.  She was also restarted on azithromycin for treatment of a previous atypical lung infection that was diagnosed over the summer.  On arrival in the ED, patient is tachycardic, tachypneic, and hypertensive.  She has increased work of breathing and accessory muscle use with inspiration.  JVD is present.  Her lungs are clear to auscultation.  Her SPO2 is normal on room air.  EKG shows variable amplitude, most notable in lead V5.  This, along with her symptoms and presentation, are concerning for cardiac tamponade, although this is unlikely in the setting of hypertension.  Additionally, patient has been diagnosed with moderate to severe aortic stenosis.  This was on an echocardiogram in June of this year.  This may be contributing to her symptoms, however, this would also be inconsistent with her current hypertension.  On bedside echocardiogram, there is evidence of a moderate pericardial effusion with right atrial collapse.  Although patient currently does not appear to be in tamponade, these findings are concerning for impending tamponade.  A stat echocardiogram was ordered.  I received a call from the sonographer who stated that she would need I would need to contact her cardiologist to interpret the study.   I spoke with the cardiologist attending on call, Dr. Marisue Ivan, who stated that there is no concern for pericardial tamponade in the setting of hypertension.  Given that ultrasound sonographer is at [facility], stat echocardiogram was refused.  Patient to be admitted to [facility] to undergo further cardiac  testing.  Per Dr. Vivia Ewing recommendation, patient to be treated for CHF exacerbation with cardiology consult tomorrow as needed.  40 mg of Lasix was ordered.  Patient did not have any clinical worsening throughout the remainder of the shift.  She was admitted to hospitalist for further management and to await transfer to [facility].  Final Clinical Impression(s) / ED Diagnoses Final diagnoses:  Shortness of breath  Exercise intolerance    Rx / DC Orders ED Discharge Orders     None        Godfrey Pick, MD 07/22/21 [phone]

## 2021-07-21 NOTE — Subjective & Objective (Signed)
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

## 2021-07-21 NOTE — ED Triage Notes (Signed)
Pt c/o SOB, with diagnosed lung infection. Pt saw Dr. Luiz Iron and was told to come to [facility] ED and be admitted. Pt was told by MD that she was retaining fluid, pt tachycardic in triage with BP 201/109.

## 2021-07-21 NOTE — ED Notes (Addendum)
Called requesting stat Echocardiogram for EDP, Dixon@20 :28pm.

## 2021-07-22 ENCOUNTER — Inpatient Hospital Stay (HOSPITAL_COMMUNITY): Payer: Medicare HMO

## 2021-07-22 ENCOUNTER — Encounter (HOSPITAL_COMMUNITY): Payer: Self-pay | Admitting: Internal Medicine

## 2021-07-22 DIAGNOSIS — I10 Essential (primary) hypertension: Secondary | ICD-10-CM

## 2021-07-22 DIAGNOSIS — I3139 Other pericardial effusion (noninflammatory): Principal | ICD-10-CM

## 2021-07-22 DIAGNOSIS — I350 Nonrheumatic aortic (valve) stenosis: Secondary | ICD-10-CM

## 2021-07-22 DIAGNOSIS — J471 Bronchiectasis with (acute) exacerbation: Secondary | ICD-10-CM | POA: Diagnosis present

## 2021-07-22 DIAGNOSIS — E8770 Fluid overload, unspecified: Secondary | ICD-10-CM | POA: Diagnosis present

## 2021-07-22 DIAGNOSIS — K7469 Other cirrhosis of liver: Secondary | ICD-10-CM | POA: Diagnosis present

## 2021-07-22 LAB — CBC WITH DIFFERENTIAL/PLATELET
Abs Immature Granulocytes: 0.02 10*3/uL (ref 0.00–0.07)
Basophils Absolute: 0 10*3/uL (ref 0.0–0.1)
Basophils Relative: 1 %
Eosinophils Absolute: 0.1 10*3/uL (ref 0.0–0.5)
Eosinophils Relative: 1 %
HCT: 33.6 % — ABNORMAL LOW (ref 36.0–46.0)
Hemoglobin: 11.3 g/dL — ABNORMAL LOW (ref 12.0–15.0)
Immature Granulocytes: 0 %
Lymphocytes Relative: 27 %
Lymphs Abs: 2 10*3/uL (ref 0.7–4.0)
MCH: 30.5 pg (ref 26.0–34.0)
MCHC: 33.6 g/dL (ref 30.0–36.0)
MCV: 90.8 fL (ref 80.0–100.0)
Monocytes Absolute: 0.6 10*3/uL (ref 0.1–1.0)
Monocytes Relative: 8 %
Neutro Abs: 4.7 10*3/uL (ref 1.7–7.7)
Neutrophils Relative %: 63 %
Platelets: 275 10*3/uL (ref 150–400)
RBC: 3.7 MIL/uL — ABNORMAL LOW (ref 3.87–5.11)
RDW: 13.4 % (ref 11.5–15.5)
WBC: 7.5 10*3/uL (ref 4.0–10.5)
nRBC: 0 % (ref 0.0–0.2)

## 2021-07-22 LAB — COMPREHENSIVE METABOLIC PANEL
ALT: 99 U/L — ABNORMAL HIGH (ref 0–44)
AST: 70 U/L — ABNORMAL HIGH (ref 15–41)
Albumin: 3.2 g/dL — ABNORMAL LOW (ref 3.5–5.0)
Alkaline Phosphatase: 175 U/L — ABNORMAL HIGH (ref 38–126)
Anion gap: 10 (ref 5–15)
BUN: 21 mg/dL (ref 8–23)
CO2: 28 mmol/L (ref 22–32)
Calcium: 9.1 mg/dL (ref 8.9–10.3)
Chloride: 93 mmol/L — ABNORMAL LOW (ref 98–111)
Creatinine, Ser: 1.09 mg/dL — ABNORMAL HIGH (ref 0.44–1.00)
GFR, Estimated: 50 mL/min — ABNORMAL LOW (ref >60)
Glucose, Bld: 109 mg/dL — ABNORMAL HIGH (ref 70–99)
Potassium: 3.9 mmol/L (ref 3.5–5.1)
Sodium: 131 mmol/L — ABNORMAL LOW (ref 135–145)
Total Bilirubin: 0.9 mg/dL (ref 0.3–1.2)
Total Protein: 6.6 g/dL (ref 6.5–8.1)

## 2021-07-22 LAB — CK: Total CK: 49 U/L (ref 38–234)

## 2021-07-22 LAB — LACTIC ACID, PLASMA
Lactic Acid, Venous: 0.9 mmol/L (ref 0.5–1.9)
Lactic Acid, Venous: 0.7 mmol/L (ref 0.5–1.9)

## 2021-07-22 LAB — ECHOCARDIOGRAM COMPLETE
AR max vel: 1.41 cm2
AV Area VTI: 1.38 cm2
AV Area mean vel: 1.25 cm2
AV Mean grad: 33 mmHg
AV Peak grad: 46.5 mmHg
Ao pk vel: 3.41 m/s
Area-P 1/2: 4.24 cm2
S' Lateral: 1.8 cm

## 2021-07-22 LAB — OSMOLALITY: Osmolality: 276 mOsm/kg (ref 275–295)

## 2021-07-22 LAB — MAGNESIUM: Magnesium: 2.2 mg/dL (ref 1.7–2.4)

## 2021-07-22 LAB — PHOSPHORUS
Phosphorus: 3 mg/dL (ref 2.5–4.6)
Phosphorus: 3.4 mg/dL (ref 2.5–4.6)

## 2021-07-22 LAB — OSMOLALITY, URINE: Osmolality, Ur: 246 mOsm/kg — ABNORMAL LOW (ref 300–900)

## 2021-07-22 LAB — TROPONIN I (HIGH SENSITIVITY)
Troponin I (High Sensitivity): 51 ng/L — ABNORMAL HIGH (ref <18)
Troponin I (High Sensitivity): 48 ng/L — ABNORMAL HIGH (ref <18)

## 2021-07-22 LAB — TSH
TSH: 1.763 u[IU]/mL (ref 0.350–4.500)
TSH: 1.472 u[IU]/mL (ref 0.350–4.500)

## 2021-07-22 MED ORDER — LORATADINE 10 MG PO TABS
10.0000 mg | ORAL_TABLET | Freq: Every day | ORAL | Status: DC
  Filled 2021-07-22 (×7): qty 1

## 2021-07-22 MED ORDER — SODIUM CHLORIDE 0.9 % WEIGHT BASED INFUSION
3.0000 mL/kg/h | INTRAVENOUS | Status: DC
  Administered 2021-07-23: 04:00:00 3 mL/kg/h via INTRAVENOUS

## 2021-07-22 MED ORDER — METOPROLOL TARTRATE 25 MG PO TABS
12.5000 mg | ORAL_TABLET | Freq: Two times a day (BID) | ORAL | Status: DC
  Administered 2021-07-22 (×2): 12.5 mg via ORAL
  Filled 2021-07-22 (×2): qty 1

## 2021-07-22 MED ORDER — ACETAMINOPHEN 500 MG PO TABS
500.0000 mg | ORAL_TABLET | Freq: Four times a day (QID) | ORAL | Status: DC | PRN
  Administered 2021-07-22 – 2021-07-23 (×2): 1000 mg via ORAL
  Administered 2021-07-23: 20:00:00 500 mg via ORAL
  Administered 2021-07-24 – 2021-07-28 (×7): 1000 mg via ORAL
  Filled 2021-07-22 (×10): qty 2

## 2021-07-22 MED ORDER — LIDOCAINE 5 % EX PTCH: 1.0000 | MEDICATED_PATCH | CUTANEOUS | Status: DC

## 2021-07-22 MED ORDER — ALBUTEROL SULFATE (2.5 MG/3ML) 0.083% IN NEBU: 2.5000 mg | INHALATION_SOLUTION | RESPIRATORY_TRACT | Status: DC | PRN

## 2021-07-22 MED ORDER — DOCUSATE SODIUM 100 MG PO CAPS
100.0000 mg | ORAL_CAPSULE | Freq: Every day | ORAL | Status: DC
  Administered 2021-07-22 – 2021-07-30 (×9): 100 mg via ORAL
  Filled 2021-07-22 (×9): qty 1

## 2021-07-22 MED ORDER — VITAMIN D 25 MCG (1000 UNIT) PO TABS
1000.0000 [IU] | ORAL_TABLET | Freq: Every day | ORAL | Status: DC
  Administered 2021-07-23 – 2021-07-30 (×8): 1000 [IU] via ORAL
  Filled 2021-07-22 (×9): qty 1

## 2021-07-22 MED ORDER — METOPROLOL TARTRATE 25 MG PO TABS: 25.0000 mg | ORAL_TABLET | Freq: Two times a day (BID) | ORAL | Status: DC

## 2021-07-22 MED ORDER — LISINOPRIL 5 MG PO TABS
25.0000 mg | ORAL_TABLET | Freq: Every day | ORAL | Status: DC
  Administered 2021-07-22 – 2021-07-30 (×9): 25 mg via ORAL
  Filled 2021-07-22 (×6): qty 1
  Filled 2021-07-22: qty 3
  Filled 2021-07-22 (×2): qty 1

## 2021-07-22 MED ORDER — MONTELUKAST SODIUM 10 MG PO TABS
10.0000 mg | ORAL_TABLET | Freq: Every day | ORAL | Status: DC
  Administered 2021-07-22 – 2021-07-29 (×8): 10 mg via ORAL
  Filled 2021-07-22 (×8): qty 1

## 2021-07-22 MED ORDER — ALBUTEROL SULFATE HFA 108 (90 BASE) MCG/ACT IN AERS: 2.0000 | INHALATION_SPRAY | RESPIRATORY_TRACT | Status: DC | PRN

## 2021-07-22 MED ORDER — SODIUM CHLORIDE 0.9% FLUSH
3.0000 mL | Freq: Two times a day (BID) | INTRAVENOUS | Status: DC
  Administered 2021-07-22: 23:00:00 3 mL via INTRAVENOUS

## 2021-07-22 MED ORDER — ASPIRIN EC 81 MG PO TBEC
81.0000 mg | DELAYED_RELEASE_TABLET | Freq: Every day | ORAL | Status: DC
  Administered 2021-07-22 – 2021-07-29 (×6): 81 mg via ORAL
  Filled 2021-07-22 (×7): qty 1

## 2021-07-22 MED ORDER — SODIUM CHLORIDE 0.9% FLUSH: 3.0000 mL | INTRAVENOUS | Status: DC | PRN

## 2021-07-22 MED ORDER — SODIUM CHLORIDE 0.9 % IV SOLN: 250.0000 mL | INTRAVENOUS | Status: DC | PRN

## 2021-07-22 MED ORDER — SODIUM CHLORIDE 0.9% FLUSH
3.0000 mL | Freq: Two times a day (BID) | INTRAVENOUS | Status: DC
  Administered 2021-07-22 (×2): 3 mL via INTRAVENOUS

## 2021-07-22 MED ORDER — PANTOPRAZOLE SODIUM 40 MG PO TBEC
40.0000 mg | DELAYED_RELEASE_TABLET | Freq: Every day | ORAL | Status: DC
  Administered 2021-07-25 – 2021-07-30 (×4): 40 mg via ORAL
  Filled 2021-07-22 (×8): qty 1

## 2021-07-22 MED ORDER — METOPROLOL TARTRATE 25 MG PO TABS
25.0000 mg | ORAL_TABLET | Freq: Two times a day (BID) | ORAL | Status: DC
  Administered 2021-07-22 – 2021-07-30 (×13): 25 mg via ORAL
  Filled 2021-07-22 (×14): qty 1

## 2021-07-22 MED ORDER — ESCITALOPRAM OXALATE 10 MG PO TABS
5.0000 mg | ORAL_TABLET | Freq: Every day | ORAL | Status: DC
  Administered 2021-07-22 – 2021-07-30 (×9): 5 mg via ORAL
  Filled 2021-07-22 (×9): qty 1

## 2021-07-22 MED ORDER — ASPIRIN 81 MG PO CHEW
81.0000 mg | CHEWABLE_TABLET | ORAL | Status: AC
  Administered 2021-07-23: 06:00:00 81 mg via ORAL
  Filled 2021-07-22: qty 1

## 2021-07-22 MED ORDER — SODIUM CHLORIDE 0.9 % WEIGHT BASED INFUSION
1.0000 mL/kg/h | INTRAVENOUS | Status: DC
  Administered 2021-07-23: 06:00:00 1 mL/kg/h via INTRAVENOUS

## 2021-07-22 MED ORDER — AZITHROMYCIN 250 MG PO TABS
250.0000 mg | ORAL_TABLET | Freq: Every day | ORAL | Status: DC
  Administered 2021-07-22 – 2021-07-30 (×9): 250 mg via ORAL
  Filled 2021-07-22 (×10): qty 1

## 2021-07-22 NOTE — Progress Notes (Signed)
HOSPITAL MEDICINE OVERNIGHT EVENT NOTE    Contacted by nursing due to patient exhibiting bouts of significant tachycardia with heart rates upwards of 150 to 160 bpm on occasion, mainly when the patient begins ambulating and pacing around the room.  Patient is hemodynamically stable and denies chest pain or shortness of breath.  Nursing has obtained 2 EKGs.  1 EKG interpretation is atrial fibrillation however I disagree with this.  Patient is likely experiencing sinus tachycardia with extremely frequent PACs as the P waves are easily discernible.  Chart reviewed, last cardiology recommendation was for patient to receive an increased dose of metoprolol.  Next dose of metoprolol is not until 10 PM, we will go ahead and administer this now.  Advised nursing to not let patient ambulate around the room for now.    Will monitor patient's response to administration of metoprolol.  Nursing to notify me if patient begins to develop chest pain, shortness of breath, worsening tachycardia or obvious rhythm change.  Marinda Elk  MD [facility]

## 2021-07-22 NOTE — Assessment & Plan Note (Signed)
Echogram appreciate cardiology input

## 2021-07-22 NOTE — ED Notes (Signed)
RN walked into room, patients heart rate remained in 160s. EKG performed. Dr. Leafy Half notified [facility] coverage.

## 2021-07-22 NOTE — Assessment & Plan Note (Signed)
Chest x-ray showing potentially worsening pericardial effusion Bedside echogram was done by ER provider which confirmed pericardial effusion Cardiology was consulted and recommended diuresis and admitted to [facility] repeat echogram in a.m.  - Will monitor for any evidence of tamponade N.p.o. postmidnight in case needs procedure Avoid over aggressive fluid shifts for today appreciate cardiology consult

## 2021-07-22 NOTE — Assessment & Plan Note (Addendum)
Received a dose of Lasix in the emergency department good diuresis Obtain Avoid over aggressive fluid shifts given worsening pericardial effusion Appreciate cardiology consult in a.m.

## 2021-07-22 NOTE — Assessment & Plan Note (Signed)
Continue lisinopril

## 2021-07-22 NOTE — Progress Notes (Signed)
PROGRESS NOTE  Barbara Mcconnell ZOX:[id number] DOB: 07/13/1935 DOA: 07/21/2021 PCP: Kristopher Glee., MD   LOS: 1 day   Brief narrative:  Barbara Mcconnell is a 86 y.o. female with medical history significant of MAC, HTN, HLD  Takotsubo cardiomyopathy presented hospital with increased work of breathing.  Patient was recently seen by pulmonary as outpatient and was restarted on azithromycin for MAC infection.  Was also seen by primary care physician but no hypoxia but was tachypneic so was sent to the ED.  CT angiogram of the chest done on 07/19/2019 showed no evidence of PE and scattered tree-in-bud nodular densities in the lungs.  Patient continued to report orthopnea lightheadedness with some back pain.  Patient does have a history of 2 episodes of Takotsubo cardiomyopathy in the past.  In the ED, initial vitals were elevated with blood pressure of 201/109, patient was mildly tachycardic at 110/min and tachypneic at 30 breaths/min.  Patient was afebrile with a temperature of 98.2 F with pulse ox of 98%.  D-dimer was 1.5 with BNP of 201 and hemoglobin of 11.5.  Sodium was low at 128.  EKG showed normal sinus rhythm.  Bedside echocardiogram was done which showed worsening pericardial effusion.  Cardiology was consulted who recommended admission to [facility].  Chest x-ray showed cardiomegaly with vascular congestion and small bilateral pleural effusion.  In the ED patient was given albuterol inhaler, Lasix 40 mg and was considered for admission to the hospital.   Assessment/Plan:  Principal Problem:   Pericardial effusion Active Problems:   Essential hypertension   Dyslipidemia, goal LDL below 70   Carotid arterial disease (HCC)   Aortic stenosis   Hypertension   Fluid overload   Bronchiectasis with acute exacerbation (HCC)   Other cirrhosis of liver (HCC)  Pericardial effusion with dyspnea..  Increased pericardial effusion status from bedside echo.  Currently on board.  Continue IV diuresis  as per cardiology.  Patient has been waiting for transfer to [facility]   Essential hypertension.  On lisinopril.  We will continue.  Hyperlipidemia.  On Crestor as outpatient.  Currently on hold due to elevated LFT.  Aortic stenosis.  Check 2D echocardiogram.  Cardiology has been consulted.  Fluid overload.  Patient received IV Lasix in ED.  Continue IV diuresis at this time.  Bronchiectasis with acute exacerbation.  History of MAC.  Continue azithromycin.  Might benefit from pulmonary evaluation.  Cirrhosis of liver.  Right upper quadrant ultrasound with evidence of possible early cirrhosis.  Could be secondary to cardiogenic etiology.  LFTs mildly elevated.  Hold statins.  Closely monitor.  No need for inpatient GI follow-up.  Communicated with Dr. Therisa Doyne who recommended outpatient follow-up with [facility] on discharge.   DVT prophylaxis: SCDs Start: 07/22/21 0013   Code Status: DNR/DNI  Family Communication: Spoke with the patient at bedside.  Status is: Inpatient  Remains inpatient appropriate because: Need for IV diuresis, cardiac evaluation and monitoring.   Consultants: Cardiology  Procedures: Bedside 2D echo  Anti-infectives:  Azithromycin  Anti-infectives (From admission, onward)    Start     Dose/Rate Route Frequency Ordered Stop   07/22/21 1000  azithromycin (ZITHROMAX) tablet 250 mg        250 mg Oral Daily 07/22/21 0012        Subjective: Today, patient was seen and examined at bedside.  Complains of shortness of breath on minimal exertion.  Has orthopnea.  Denies any chest pain, nausea or vomiting.    Objective:  Vitals:   07/22/21 0600 07/22/21 0800  BP: (!) 164/88 (!) 156/84  Pulse: 96 100  Resp: (!) 26 (!) 25  Temp:    SpO2: 96% 96%   No intake or output data in the 24 hours ending 07/22/21 0851 There were no vitals filed for this visit. There is no height or weight on file to calculate BMI.   Physical Exam:  GENERAL: Patient is  alert awake and oriented.  In mild distress, short of breath even on talking, on nasal cannula oxygen, chronically ill. HENT: No scleral pallor or icterus. Pupils equally reactive to light. Oral mucosa is moist, poor dentition elevated JVD NECK: is cardiomyopathy, no gross swelling noted. CHEST: Clear to auscultation. No crackles or wheezes.  Diminished breath sounds bilaterally. CVS: S1 and S2 heard, systolic murmur noted ABDOMEN: Soft, non-tender, bowel sounds are present. EXTREMITIES: Bilateral lower extremity trace edema noted CNS: Cranial nerves are intact. No focal motor deficits. SKIN: warm and dry without rashes.  Data Review: I have personally reviewed the following laboratory data and studies,  CBC: Recent Labs  Lab 07/21/21 1948 07/22/21 0421  WBC 8.4 7.5  NEUTROABS 5.8 4.7  HGB 11.5* 11.3*  HCT 35.6* 33.6*  MCV 91.8 90.8  PLT 267 992   Basic Metabolic Panel: Recent Labs  Lab 07/21/21 1948 07/22/21 0022 07/22/21 0421  NA 128*  --  131*  K 4.2  --  3.9  CL 93*  --  93*  CO2 27  --  28  GLUCOSE 112*  --  109*  BUN 20  --  21  CREATININE 0.93  --  1.09*  CALCIUM 8.9  --  9.1  MG 2.1  --  2.2  PHOS  --  3.0 3.4   Liver Function Tests: Recent Labs  Lab 07/21/21 1948 07/22/21 0421  AST 106* 70*  ALT 121* 99*  ALKPHOS 205* 175*  BILITOT 0.8 0.9  PROT 7.1 6.6  ALBUMIN 3.5 3.2*   No results for input(s): LIPASE, AMYLASE in the last 168 hours. No results for input(s): AMMONIA in the last 168 hours. Cardiac Enzymes: Recent Labs  Lab 07/22/21 0022  CKTOTAL 49   BNP (last 3 results) Recent Labs    06/30/21 1323 07/21/21 1948  BNP 350.9* 201.8*    ProBNP (last 3 results) No results for input(s): PROBNP in the last 8760 hours.  CBG: No results for input(s): GLUCAP in the last 168 hours. Recent Results (from the past 240 hour(s))  Resp Panel by RT-PCR (Flu A&B, Covid) Nasopharyngeal Swab     Status: None   Collection Time: 07/21/21  7:48 PM    Specimen: Nasopharyngeal Swab; Nasopharyngeal(NP) swabs in vial transport medium  Result Value Ref Range Status   SARS Coronavirus 2 by RT PCR NEGATIVE NEGATIVE Final    Comment: (NOTE) SARS-CoV-2 target nucleic acids are NOT DETECTED.  The SARS-CoV-2 RNA is generally detectable in upper respiratory specimens during the acute phase of infection. The lowest concentration of SARS-CoV-2 viral copies this assay can detect is 138 copies/mL. A negative result does not preclude SARS-Cov-2 infection and should not be used as the sole basis for treatment or other patient management decisions. A negative result may occur with  improper specimen collection/handling, submission of specimen other than nasopharyngeal swab, presence of viral mutation(s) within the areas targeted by this assay, and inadequate number of viral copies(<138 copies/mL). A negative result must be combined with clinical observations, patient history, and epidemiological information. The expected result is  Negative.  Fact Sheet for Patients:  EntrepreneurPulse.com.au  Fact Sheet for Healthcare Providers:  IncredibleEmployment.be  This test is no t yet approved or cleared by the [place] FDA and  has been authorized for detection and/or diagnosis of SARS-CoV-2 by FDA under an Emergency Use Authorization (EUA). This EUA will remain  in effect (meaning this test can be used) for the duration of the COVID-19 declaration under Section 564(b)(1) of the Act, 21 U.S.C.section 360bbb-3(b)(1), unless the authorization is terminated  or revoked sooner.       Influenza A by PCR NEGATIVE NEGATIVE Final   Influenza B by PCR NEGATIVE NEGATIVE Final    Comment: (NOTE) The Xpert Xpress SARS-CoV-2/FLU/RSV plus assay is intended as an aid in the diagnosis of influenza from Nasopharyngeal swab specimens and should not be used as a sole basis for treatment. Nasal washings and aspirates are  unacceptable for Xpert Xpress SARS-CoV-2/FLU/RSV testing.  Fact Sheet for Patients: EntrepreneurPulse.com.au  Fact Sheet for Healthcare Providers: IncredibleEmployment.be  This test is not yet approved or cleared by the [place] FDA and has been authorized for detection and/or diagnosis of SARS-CoV-2 by FDA under an Emergency Use Authorization (EUA). This EUA will remain in effect (meaning this test can be used) for the duration of the COVID-19 declaration under Section 564(b)(1) of the Act, 21 U.S.C. section 360bbb-3(b)(1), unless the authorization is terminated or revoked.  Performed at [facility], [place] [address], [place], [place] [address]      Studies: DG Chest 2 View  Result Date: 07/21/2021 CLINICAL DATA:  Shortness of breath. EXAM: CHEST - 2 VIEW COMPARISON:  Chest radiograph dated 07/18/2021. FINDINGS: Cardiomegaly with vascular congestion. Probable small bilateral pleural effusions and bibasilar atelectasis or infiltrate. No pneumothorax. Atherosclerotic calcification of the aorta. No acute osseous pathology. IMPRESSION: Cardiomegaly with vascular congestion and small bilateral pleural effusions. Electronically Signed   By: Anner Crete M.D.   On: 07/21/2021 19:28   US Abdomen Limited RUQ (LIVER/GB)  Result Date: 07/21/2021 CLINICAL DATA:  Elevated LFTs. EXAM: ULTRASOUND ABDOMEN LIMITED RIGHT UPPER QUADRANT COMPARISON:  CT dated 05/13/2019. FINDINGS: Gallbladder: No gallstones or wall thickening visualized. No sonographic Murphy sign noted by sonographer. Common bile duct: Diameter: 5 mm Liver: The liver demonstrates a coarsened echotexture with normal echogenicity. There is slight irregularity of the liver contour. Clinical correlation is recommended to evaluate for early changes of cirrhosis. Portal vein is patent on color Doppler imaging with normal direction of blood flow towards the liver. Other: None.  IMPRESSION: Coarsened liver echotexture, otherwise unremarkable right upper quadrant ultrasound. Electronically Signed   By: Anner Crete M.D.   On: 07/21/2021 23:52      Flora Lipps, MD  [facility] 07/22/2021  If 7PM-7AM, please contact night-coverage

## 2021-07-22 NOTE — Consult Note (Addendum)
Cardiology Consultation:   Patient ID: Barbara Mcconnell MRN: [id number]; DOB: September 16, 1934  Admit date: 07/21/2021 Date of Consult: 07/22/2021  PCP:  Kristopher Glee., MD   [facility] Providers Cardiologist:  Peter Martinique, MD   {  Patient Profile:   Barbara Mcconnell is a 86 y.o. female with a hx of Takotsubo cardiomyopathy with normalized LV function hypertension, hyperlipidemia with statin intolerance, moderate aortic stenosis and peripheral vascular disease with moderate carotid stenosis ho is being seen 07/22/2021 for the evaluation of congestive heart failure at the request of Dr. Louanne Belton.  Reported history of Takotsubo cardiomyopathy in 2015.  He was admitted to [facility].  Cath with some blockages but did not require stenting >>treated medically.  Follow-up echo showed good recovery.  Admitted to [facility] October 2020 for chest pain concerning for unstable angina.  Cath revealed 50% stenosis of proximal RCA otherwise nonobstructive CAD with moderate pulmonary hypertension. LV gram showed apical dyskinesis very quite consistent with Takotsubo. Denied any recent stressful events though she did mention difficulty sleeping recently.  Noted to have LVEF of 35-40% on follow up echo. Also noted moderate AS with dynamic LVOF.  Placed on Toprol and lisinopril.  Patient was doing relatively well on cardiac standpoint when last seen by Dr. Martinique July 11, 2019.  Last echocardiogram March 2021 showed improved LV function to 60 to 65% with no wall motion abnormality.  Moderate to severe aortic stenosis with mean gradient of 20.5 mmHg.  History of Present Illness:   [name] was seen at [facility] ER 12/25 for worsening for shortness of breath and shoulder pain.  Seen by by [facility] pulmonology July 20, 2021 for ER follow-up.  Seems reviewed CT of chest (no PE) from ER and diagnosed with left upper lobe pneumonia>> he was started on azithromycin.   Recommended outpatient sleep study for daytime somnolence and snoring.  Patient was seen by PCP yesterday and noted tachycardic with mild respiratory distress.  Patient did not wanted to go back to [place] and daughter brought him to [facility] long emergency room.  On arrival to ER patient was hypertensive at 201/109. Heart Rate 110.   Per note, bedside echo by ED provider showed worsening pericardial effusion.  Patient was admitted under internal medicine service.  Given IV Lasix 40 mg x 1.  Chest x-ray showed cardiomegaly with vascular congestion and small bilateral pleural effusion. BNP 201 Hs-troponin 63>>56>>51>>48 Elevated LFTs>> trending down Sodium 128>>131 Creatinine 0.93>>1.09 Hemoglobin stable at 11.3 TSH normal Respiratory panel negative for COVID and influenza Right upper quarter ultrasound is unremarkable Echo done pending reading  At baseline, patient is very active.  She does light exercise 3 times per week.  She does Silver sneakers and house hold activities.  For past 6 months he has noted progressive worsening dyspnea on exertion.  Intermittent chest tightness especially waking up in the morning.  Her breathing gets better as days goes by.  Has some orthopnea and PND and lower extremity edema.  May eats excess salt.   Past Medical History:  Diagnosis Date   Aortic stenosis    CAD (coronary artery disease)    cath 04/2019: 50% pRCA   Coronary artery disease    Hyperlipemia    Hypertension    Takotsubo cardiomyopathy     Past Surgical History:  Procedure Laterality Date   RIGHT/LEFT HEART CATH AND CORONARY ANGIOGRAPHY N/A 05/13/2019   Procedure: RIGHT/LEFT HEART CATH AND CORONARY ANGIOGRAPHY;  Surgeon:  Martinique, Peter M, MD;  Location: [place] [facility];  Service: Cardiovascular;  Laterality: N/A;   TONSILLECTOMY      Inpatient Medications: Scheduled Meds:  azithromycin  250 mg Oral Daily   escitalopram  5 mg Oral Daily   lidocaine  1 patch Transdermal Q24H    lisinopril  25 mg Oral Daily   metoprolol tartrate  12.5 mg Oral BID   montelukast  10 mg Oral QPC supper   sodium chloride flush  3 mL Intravenous Q12H   Continuous Infusions:  sodium chloride     PRN Meds: sodium chloride, albuterol, sodium chloride flush  Allergies:    Allergies  Allergen Reactions   Codeine Nausea And Vomiting and Nausea Only   Cyclobenzaprine    Iodinated Contrast Media    Prednisone Other (See Comments)    Sores in mouth    Social History:   Social History   Socioeconomic History   Marital status: Divorced    Spouse name: Not on file   Number of children: Not on file   Years of education: Not on file   Highest education level: Not on file  Occupational History   Not on file  Tobacco Use   Smoking status: Never   Smokeless tobacco: Never  Vaping Use   Vaping Use: Never used  Substance and Sexual Activity   Alcohol use: No   Drug use: No   Sexual activity: Not Currently  Other Topics Concern   Not on file  Social History Narrative   Not on file   Social Determinants of Health   Financial Resource Strain: Not on file  Food Insecurity: Not on file  Transportation Needs: Not on file  Physical Activity: Not on file  Stress: Not on file  Social Connections: Not on file  Intimate Partner Violence: Not on file    Family History:    Family History  Problem Relation Age of Onset   Hypertension Other      ROS:  Please see the history of present illness.  All other ROS reviewed and negative.     Physical Exam/Data:   Vitals:   07/22/21 0445 07/22/21 0500 07/22/21 0600 07/22/21 0800  BP: 133/88 (!) 128/108 (!) 164/88 (!) 156/84  Pulse: 91 91 96 100  Resp: (!) 21 20 (!) 26 (!) 25  Temp:      TempSrc:      SpO2: 94% 97% 96% 96%   No intake or output data in the 24 hours ending 07/22/21 0956 Last 3 Weights 06/30/2021 07/11/2019 05/31/2019  Weight (lbs) 131 lb 9.6 oz 128 lb 123 lb  Weight (kg) 59.693 kg 58.06 kg 55.792 kg      There is no height or weight on file to calculate BMI.  General:  Well nourished, well developed, in no acute distress HEENT: normal Neck: + JVD Vascular: No carotid bruits; Distal pulses 2+ bilaterally Cardiac:  normal S1, S2; RRR; 3/6 murmur  Lungs:  clear to auscultation bilaterally, no wheezing, rhonchi or rales  Abd: soft, nontender, no hepatomegaly  Ext: no edema Musculoskeletal:  No deformities, BUE and BLE strength normal and equal Skin: warm and dry  Neuro:  CNs 2-12 intact, no focal abnormalities noted Psych:  Normal affect   EKG:  The EKG was personally reviewed and demonstrates: Sinus tachycardia, LVH Telemetry:  Telemetry was personally reviewed and demonstrates:  NSR  Relevant CV Studies:  Echo 09/2019  1. Left ventricular ejection fraction, by estimation, is 60 to 65%.  The  left ventricle has normal function. The left ventricle has no regional  wall motion abnormalities. There is severe asymmetric left ventricular  hypertrophy of the septal segment  (Sigmoid basal septum). Left ventricular diastolic parameters are  consistent with Grade II diastolic dysfunction (pseudonormalization).   2. Right ventricular systolic function is normal. The right ventricular  size is normal. There is normal pulmonary artery systolic pressure.   3. Left atrial size was mildly dilated.   4. There is mild posterior mitral annular calcification. Mild to moderate  mitral valve regurgitation. No evidence of mitral stenosis.   5. The aortic valve is is thickened and moderately calcified with annular  calcification. Aortic valve regurgitation is mild to moderate. Moderate to  severe aortic valve stenosis. Aortic valve area, by VTI measures 0.85 cm.  Aortic valve mean gradient  measures 20.5 mmHg. Aortic valve Vmax measures 2.81 m/s.   6. Compared to prior study done is October 2020, there have been  improvement in the EF which was 30-35% with wall motion abnormalities. Now  EF is 60-65%  with no wall motion abnormality. In addition, the Aortic  stenosis is now moderate to severe was  moderate.   Carotid doppler 07/11/2019 Summary:  Right Carotid: Velocities in the right ICA are consistent with a 40-59%                 stenosis. The RICA velocities are elevated and stable  compared to                 the prior exam.   Left Carotid: Velocities in the left ICA are consistent with a 1-39%  stenosis.                Non-hemodynamically significant plaque <50% noted in the  CCA. The                LICA velocities remain within normal range and stable  compared to                the prior exam.   Vertebrals:  Bilateral vertebral arteries demonstrate antegrade flow.  Subclavians: Normal flow hemodynamics were seen in bilateral subclavian               arteries.    Cardiac cath: 05/13/19:  RIGHT/LEFT HEART CATH AND CORONARY ANGIOGRAPHY  Conclusion     Prox RCA lesion is 50% stenosed. There is moderate to severe left ventricular systolic dysfunction. LV end diastolic pressure is moderately elevated. The left ventricular ejection fraction is 35-45% by visual estimate. There is trivial (1+) mitral regurgitation. Hemodynamic findings consistent with moderate pulmonary hypertension.   1. Nonobstructive CAD 2. Moderate to severe LV dysfunction. EF 35-40%. Wall motion abnormality classic for Takotsubo's cardiomyopathy 3. Moderately elevated LV filling pressures 4. Moderate pulmonary venous HTN 5. Reduced cardiac output. CI 2.3   Plan: patient was SOB in the lab. We administered IV Ntg and lasix in the lab and increased oxygen with improvement in her symptoms and improvement in PA sat from 62% to 66%. Will continue with IV diuresis, continue ACEi and Metoprolol. Echo.     Echo 05/14/19: 1. There is apical ballooning with akinesis of the mid and apical lateral, mid inferoseptal, apical septal, apical, mid and apical anterior, apical inferior, mid anteroseptal and  apical inferolateral walls. Left ventricular ejection fraction, by visual estimation, is 30 to 35%. There is severely increased left ventricular hypertrophy. The left ventricular hypertrophy involves basal-septum walls. Findings consistent with  stress induced CM. 2. Left ventricular diastolic Doppler parameters are consistent with impaired relaxation pattern of LV diastolic filling. 3. Global right ventricle has normal systolic function.The right ventricular size is normal. No increase in right ventricular wall thickness. 4. Left atrial size was normal. 5. Right atrial size was normal. 6. Moderate mitral annular calcification. 7. The mitral valve is normal in structure. Mild mitral valve regurgitation. No evidence of mitral stenosis. 8. The tricuspid valve is normal in structure. Tricuspid valve regurgitation is mild. 9. The aortic valve is tricuspid. There is Severely thickening of the aortic valve. There is Severe calcifcation of the aortic valve. Aortic valve regurgitation was not visualized by color flow Doppler. Moderate aortic valve stenosis. The degree of aortic stenosis may be underestimated due to underlying LV dysfunction 10. The pulmonic valve was normal in structure. Pulmonic valve regurgitation is trivial by color     Laboratory Data:  High Sensitivity Troponin:   Recent Labs  Lab 06/30/21 1532 07/21/21 1948 07/21/21 2152 07/22/21 0022 07/22/21 0421  TROPONINIHS 13 63* 56* 51* 48*     Chemistry Recent Labs  Lab 07/21/21 1948 07/22/21 0421  NA 128* 131*  K 4.2 3.9  CL 93* 93*  CO2 27 28  GLUCOSE 112* 109*  BUN 20 21  CREATININE 0.93 1.09*  CALCIUM 8.9 9.1  MG 2.1 2.2  GFRNONAA >60 50*  ANIONGAP 8 10    Recent Labs  Lab 07/21/21 1948 07/22/21 0421  PROT 7.1 6.6  ALBUMIN 3.5 3.2*  AST 106* 70*  ALT 121* 99*  ALKPHOS 205* 175*  BILITOT 0.8 0.9   Lipids No results for input(s): CHOL, TRIG, HDL, LABVLDL, LDLCALC, CHOLHDL in the last 168 hours.   Hematology Recent Labs  Lab 07/21/21 1948 07/22/21 0421  WBC 8.4 7.5  RBC 3.88 3.70*  HGB 11.5* 11.3*  HCT 35.6* 33.6*  MCV 91.8 90.8  MCH 29.6 30.5  MCHC 32.3 33.6  RDW 13.4 13.4  PLT 267 275   Thyroid  Recent Labs  Lab 07/22/21 0421  TSH 1.472    BNP Recent Labs  Lab 07/21/21 1948  BNP 201.8*    DDimer  Recent Labs  Lab 07/21/21 1948  DDIMER 1.58*     Radiology/Studies:  DG Chest 2 View  Result Date: 07/21/2021 CLINICAL DATA:  Shortness of breath. EXAM: CHEST - 2 VIEW COMPARISON:  Chest radiograph dated 07/18/2021. FINDINGS: Cardiomegaly with vascular congestion. Probable small bilateral pleural effusions and bibasilar atelectasis or infiltrate. No pneumothorax. Atherosclerotic calcification of the aorta. No acute osseous pathology. IMPRESSION: Cardiomegaly with vascular congestion and small bilateral pleural effusions. Electronically Signed   By: Anner Crete M.D.   On: 07/21/2021 19:28   US Abdomen Limited RUQ (LIVER/GB)  Result Date: 07/21/2021 CLINICAL DATA:  Elevated LFTs. EXAM: ULTRASOUND ABDOMEN LIMITED RIGHT UPPER QUADRANT COMPARISON:  CT dated 05/13/2019. FINDINGS: Gallbladder: No gallstones or wall thickening visualized. No sonographic Murphy sign noted by sonographer. Common bile duct: Diameter: 5 mm Liver: The liver demonstrates a coarsened echotexture with normal echogenicity. There is slight irregularity of the liver contour. Clinical correlation is recommended to evaluate for early changes of cirrhosis. Portal vein is patent on color Doppler imaging with normal direction of blood flow towards the liver. Other: None. IMPRESSION: Coarsened liver echotexture, otherwise unremarkable right upper quadrant ultrasound. Electronically Signed   By: Anner Crete M.D.   On: 07/21/2021 23:52     Assessment and Plan:   Aortic stenosis -I strongly suspect her symptoms is related  to worsening aortic stenosis.  Her last echocardiogram was approximately 3  years ago showing moderate to severe AS.  She has 18-months history of progressive worsening dyspnea on exertion.  Also mild volume overload recently related to excess salt intake and elevated blood pressure. -Pending reading of echocardiogram to confirm diagnosis -Per ED provider note there was pericardial effusion noted on bedside echocardiogram.  Fortunately patient is stable with normal blood pressure and heart rate currently.  Patient was laying comfortably during my evaluation.  However reports with minimal exertion she will get really short winded.  2.  Nonobstructive CAD with elevated troponin -50% RCA stenosis by cardiac catheterization in 2020 -Intermittent substernal chest pressure -Chest pain-free -As above -Home Crestor on hold due to elevated LFTs -Add aspirin 81 mg daily  3.  History of Takotsubo cardiomyopathy improved LV function -Pending echocardiogram reading today  4.  Acute on chronic diastolic heart failure -Given IV Lasix 40 mg x 1 -Likely patient will need low-dose Lasix  5.  Carotid artery disease -Carotid doppler 06/2019:  right ICA with a 40-59% stenosis & left ICA with a 1-39% stenosis.  - Will need repeat study  6. HTN - BP stable on lopressor 12.5mg  BID and Lisinopril 25mg  qd  7.  Transaminitis -Unremarkable right upper quadrant ultrasound -Home Crestor on hold  Dr. Acie Fredrickson to see.   Risk Assessment/Risk Scores:        New York Heart Association (NYHA) Functional Class NYHA Class III        For questions or updates, please contact [facility] Please consult [email or web address] for contact info under    Jarrett Soho, PA  07/22/2021 9:56 AM   Attending Note:   The patient was seen and examined.  Agree with assessment and plan as noted above.  Changes made to the above note as needed.  Patient seen and independently examined with Robbie Lis, PA .   We discussed all aspects of the encounter. I agree with the assessment and plan  as stated above.     Progressive dyspnea / DOE:  has had months of progressive DOE.   Exercise intolerence.   Still going to silver sneakers but is limited in how much she can do  Her AV gradient has incresed to 33 ( from 20) echo today did not show the AV very well I think she needs a TEE  Schedule is full tomorrow    I think she may also need a R and L heart cath - although she has had caths in the past .  We are able to get a R and L heart cath for tomorrow. I have discussed the risks, benefits, optins with her and her daughter, Earnest Bailey.  They understand an agree to proceed.   I have reviewed the echo. Her LV contractility is very hypercontractile It looks like she would benefit from an increased dose of metoprolol     I have spent a total of 40 minutes with patient reviewing hospital  notes , telemetry, EKGs, labs and examining patient as well as establishing an assessment and plan that was discussed with the patient.  > 50% of time was spent in direct patient care.    Thayer Headings, Brooke Bonito., MD, [facility] 07/22/2021, 4:13 PM [address]  [place] Pager [phone]

## 2021-07-22 NOTE — ED Notes (Signed)
Attempted to call reports to RN at [facility]. Secretary stated she would have the RN call back once the charge RN has approved the admission.

## 2021-07-22 NOTE — H&P (View-Only) (Signed)
°Cardiology Consultation:  ° °Patient ID: Barbara Mcconnell °MRN: [id number]; DOB: 01/19/1935 ° °Admit date: 07/21/2021 °Date of Consult: 07/22/2021 ° °PCP:  Cabeza, Yuri M., MD °  °[facility] Providers °Cardiologist:  Barbara Jordan, MD   { ° °Patient Profile:  ° °Barbara Mcconnell is a 86 y.o. female with a hx of Takotsubo cardiomyopathy with normalized LV function hypertension, hyperlipidemia with statin intolerance, moderate aortic stenosis and peripheral vascular disease with moderate carotid stenosis ho is being seen 07/22/2021 for the evaluation of congestive heart failure at the request of Dr. Pokhrel. ° °Reported history of Takotsubo cardiomyopathy in 2015.  He was admitted to [facility].  Cath with some blockages but did not require stenting >>treated medically.  Follow-up echo showed good recovery. ° °Admitted to [facility] October 2020 for chest pain concerning for unstable angina.  Cath revealed 50% stenosis of proximal RCA otherwise nonobstructive CAD with moderate pulmonary hypertension. LV gram showed apical dyskinesis very quite consistent with Takotsubo. Denied any recent stressful events though she did mention difficulty sleeping recently.  Noted to have LVEF of 35-40% on follow up echo. Also noted moderate AS with dynamic LVOF.  Placed on Toprol and lisinopril. ° °Patient was doing relatively well on cardiac standpoint when last seen by Dr. Jordan July 11, 2019. ° °Last echocardiogram March 2021 showed improved LV function to 60 to 65% with no wall motion abnormality.  Moderate to severe aortic stenosis with mean gradient of 20.5 mmHg. ° °History of Present Illness:  ° °[name] was seen at [facility] ER 12/25 for worsening for shortness of breath and shoulder pain.  Seen by by [facility] pulmonology July 20, 2021 for ER follow-up.  Seems reviewed CT of chest (no PE) from ER and diagnosed with left upper lobe pneumonia>> he was started on azithromycin.   Recommended outpatient sleep study for daytime somnolence and snoring.  Patient was seen by PCP yesterday and noted tachycardic with mild respiratory distress.  Patient did not wanted to go back to [facility] ER and daughter brought him to  emergency room.  On arrival to ER patient was hypertensive at 201/109. Heart Rate 110.  ° °Per note, bedside echo by ED provider showed worsening pericardial effusion.  Patient was admitted under internal medicine service.  Given IV Lasix 40 mg x 1. ° °Chest x-ray showed cardiomegaly with vascular congestion and small bilateral pleural effusion. °BNP 201 °Hs-troponin 63>>56>>51>>48 °Elevated LFTs>> trending down °Sodium 128>>131 °Creatinine 0.93>>1.09 °Hemoglobin stable at 11.3 °TSH normal °Respiratory panel negative for COVID and influenza °Right upper quarter ultrasound is unremarkable °Echo done pending reading ° °At baseline, patient is very active.  She does light exercise 3 times per week.  She does Silver sneakers and house hold activities.  For past 6 months he has noted progressive worsening dyspnea on exertion.  Intermittent chest tightness especially waking up in the morning.  Her breathing gets better as days goes by.  Has some orthopnea and PND and lower extremity edema.  May eats excess salt. ° ° °Past Medical History:  °Diagnosis Date  ° Aortic stenosis   ° CAD (coronary artery disease)   ° cath 04/2019: 50% pRCA  ° Coronary artery disease   ° Hyperlipemia   ° Hypertension   ° Takotsubo cardiomyopathy   ° ° °Past Surgical History:  °Procedure Laterality Date  ° RIGHT/LEFT HEART CATH AND CORONARY ANGIOGRAPHY N/A 05/13/2019  ° Procedure: RIGHT/LEFT HEART CATH AND CORONARY ANGIOGRAPHY;  Surgeon:   Martinique, Barbara M, MD;  Location: [facility];  Service: Cardiovascular;  Laterality: N/A;   TONSILLECTOMY      Inpatient Medications: Scheduled Meds:  azithromycin  250 mg Oral Daily   escitalopram  5 mg Oral Daily   lidocaine  1 patch Transdermal Q24H    lisinopril  25 mg Oral Daily   metoprolol tartrate  12.5 mg Oral BID   montelukast  10 mg Oral QPC supper   sodium chloride flush  3 mL Intravenous Q12H   Continuous Infusions:  sodium chloride     PRN Meds: sodium chloride, albuterol, sodium chloride flush  Allergies:    Allergies  Allergen Reactions   Codeine Nausea And Vomiting and Nausea Only   Cyclobenzaprine    Iodinated Contrast Media    Prednisone Other (See Comments)    Sores in mouth    Social History:   Social History   Socioeconomic History   Marital status: Divorced    Spouse name: Not on file   Number of children: Not on file   Years of education: Not on file   Highest education level: Not on file  Occupational History   Not on file  Tobacco Use   Smoking status: Never   Smokeless tobacco: Never  Vaping Use   Vaping Use: Never used  Substance and Sexual Activity   Alcohol use: No   Drug use: No   Sexual activity: Not Currently  Other Topics Concern   Not on file  Social History Narrative   Not on file   Social Determinants of Health   Financial Resource Strain: Not on file  Food Insecurity: Not on file  Transportation Needs: Not on file  Physical Activity: Not on file  Stress: Not on file  Social Connections: Not on file  Intimate Partner Violence: Not on file    Family History:    Family History  Problem Relation Age of Onset   Hypertension Other      ROS:  Please see the history of present illness.  All other ROS reviewed and negative.     Physical Exam/Data:   Vitals:   07/22/21 0445 07/22/21 0500 07/22/21 0600 07/22/21 0800  BP: 133/88 (!) 128/108 (!) 164/88 (!) 156/84  Pulse: 91 91 96 100  Resp: (!) 21 20 (!) 26 (!) 25  Temp:      TempSrc:      SpO2: 94% 97% 96% 96%   No intake or output data in the 24 hours ending 07/22/21 0956 Last 3 Weights 06/30/2021 07/11/2019 05/31/2019  Weight (lbs) 131 lb 9.6 oz 128 lb 123 lb  Weight (kg) 59.693 kg 58.06 kg 55.792 kg      There is no height or weight on file to calculate BMI.  General:  Well nourished, well developed, in no acute distress HEENT: normal Neck: + JVD Vascular: No carotid bruits; Distal pulses 2+ bilaterally Cardiac:  normal S1, S2; RRR; 3/6 murmur  Lungs:  clear to auscultation bilaterally, no wheezing, rhonchi or rales  Abd: soft, nontender, no hepatomegaly  Ext: no edema Musculoskeletal:  No deformities, BUE and BLE strength normal and equal Skin: warm and dry  Neuro:  CNs 2-12 intact, no focal abnormalities noted Psych:  Normal affect   EKG:  The EKG was personally reviewed and demonstrates: Sinus tachycardia, LVH Telemetry:  Telemetry was personally reviewed and demonstrates:  NSR  Relevant CV Studies:  Echo 09/2019  1. Left ventricular ejection fraction, by estimation, is 60 to 65%.  The  left ventricle has normal function. The left ventricle has no regional  wall motion abnormalities. There is severe asymmetric left ventricular  hypertrophy of the septal segment  (Sigmoid basal septum). Left ventricular diastolic parameters are  consistent with Grade II diastolic dysfunction (pseudonormalization).   2. Right ventricular systolic function is normal. The right ventricular  size is normal. There is normal pulmonary artery systolic pressure.   3. Left atrial size was mildly dilated.   4. There is mild posterior mitral annular calcification. Mild to moderate  mitral valve regurgitation. No evidence of mitral stenosis.   5. The aortic valve is is thickened and moderately calcified with annular  calcification. Aortic valve regurgitation is mild to moderate. Moderate to  severe aortic valve stenosis. Aortic valve area, by VTI measures 0.85 cm.  Aortic valve mean gradient  measures 20.5 mmHg. Aortic valve Vmax measures 2.81 m/s.   6. Compared to prior study done is October 2020, there have been  improvement in the EF which was 30-35% with wall motion abnormalities. Now  EF is 60-65%  with no wall motion abnormality. In addition, the Aortic  stenosis is now moderate to severe was  moderate.   Carotid doppler 07/11/2019 Summary:  Right Carotid: Velocities in the right ICA are consistent with a 40-59%                 stenosis. The RICA velocities are elevated and stable  compared to                 the prior exam.   Left Carotid: Velocities in the left ICA are consistent with a 1-39%  stenosis.                Non-hemodynamically significant plaque <50% noted in the  CCA. The                LICA velocities remain within normal range and stable  compared to                the prior exam.   Vertebrals:  Bilateral vertebral arteries demonstrate antegrade flow.  Subclavians: Normal flow hemodynamics were seen in bilateral subclavian               arteries.    Cardiac cath: 05/13/19:  RIGHT/LEFT HEART CATH AND CORONARY ANGIOGRAPHY  Conclusion     Prox RCA lesion is 50% stenosed. There is moderate to severe left ventricular systolic dysfunction. LV end diastolic pressure is moderately elevated. The left ventricular ejection fraction is 35-45% by visual estimate. There is trivial (1+) mitral regurgitation. Hemodynamic findings consistent with moderate pulmonary hypertension.   1. Nonobstructive CAD 2. Moderate to severe LV dysfunction. EF 35-40%. Wall motion abnormality classic for Takotsubo's cardiomyopathy 3. Moderately elevated LV filling pressures 4. Moderate pulmonary venous HTN 5. Reduced cardiac output. CI 2.3   Plan: patient was SOB in the lab. We administered IV Ntg and lasix in the lab and increased oxygen with improvement in her symptoms and improvement in PA sat from 62% to 66%. Will continue with IV diuresis, continue ACEi and Metoprolol. Echo.     Echo 05/14/19: 1. There is apical ballooning with akinesis of the mid and apical lateral, mid inferoseptal, apical septal, apical, mid and apical anterior, apical inferior, mid anteroseptal and  apical inferolateral walls. Left ventricular ejection fraction, by visual estimation, is 30 to 35%. There is severely increased left ventricular hypertrophy. The left ventricular hypertrophy involves basal-septum walls. Findings consistent with  stress induced CM. 2. Left ventricular diastolic Doppler parameters are consistent with impaired relaxation pattern of LV diastolic filling. 3. Global right ventricle has normal systolic function.The right ventricular size is normal. No increase in right ventricular wall thickness. 4. Left atrial size was normal. 5. Right atrial size was normal. 6. Moderate mitral annular calcification. 7. The mitral valve is normal in structure. Mild mitral valve regurgitation. No evidence of mitral stenosis. 8. The tricuspid valve is normal in structure. Tricuspid valve regurgitation is mild. 9. The aortic valve is tricuspid. There is Severely thickening of the aortic valve. There is Severe calcifcation of the aortic valve. Aortic valve regurgitation was not visualized by color flow Doppler. Moderate aortic valve stenosis. The degree of aortic stenosis may be underestimated due to underlying LV dysfunction 10. The pulmonic valve was normal in structure. Pulmonic valve regurgitation is trivial by color     Laboratory Data:  High Sensitivity Troponin:   Recent Labs  Lab 06/30/21 1532 07/21/21 1948 07/21/21 2152 07/22/21 0022 07/22/21 0421  TROPONINIHS 13 63* 56* 51* 48*     Chemistry Recent Labs  Lab 07/21/21 1948 07/22/21 0421  NA 128* 131*  K 4.2 3.9  CL 93* 93*  CO2 27 28  GLUCOSE 112* 109*  BUN 20 21  CREATININE 0.93 1.09*  CALCIUM 8.9 9.1  MG 2.1 2.2  GFRNONAA >60 50*  ANIONGAP 8 10    Recent Labs  Lab 07/21/21 1948 07/22/21 0421  PROT 7.1 6.6  ALBUMIN 3.5 3.2*  AST 106* 70*  ALT 121* 99*  ALKPHOS 205* 175*  BILITOT 0.8 0.9   Lipids No results for input(s): CHOL, TRIG, HDL, LABVLDL, LDLCALC, CHOLHDL in the last 168 hours.   Hematology Recent Labs  Lab 07/21/21 1948 07/22/21 0421  WBC 8.4 7.5  RBC 3.88 3.70*  HGB 11.5* 11.3*  HCT 35.6* 33.6*  MCV 91.8 90.8  MCH 29.6 30.5  MCHC 32.3 33.6  RDW 13.4 13.4  PLT 267 275   Thyroid  Recent Labs  Lab 07/22/21 0421  TSH 1.472    BNP Recent Labs  Lab 07/21/21 1948  BNP 201.8*    DDimer  Recent Labs  Lab 07/21/21 1948  DDIMER 1.58*     Radiology/Studies:  DG Chest 2 View  Result Date: 07/21/2021 CLINICAL DATA:  Shortness of breath. EXAM: CHEST - 2 VIEW COMPARISON:  Chest radiograph dated 07/18/2021. FINDINGS: Cardiomegaly with vascular congestion. Probable small bilateral pleural effusions and bibasilar atelectasis or infiltrate. No pneumothorax. Atherosclerotic calcification of the aorta. No acute osseous pathology. IMPRESSION: Cardiomegaly with vascular congestion and small bilateral pleural effusions. Electronically Signed   By: Anner Crete McconnellD.   On: 07/21/2021 19:28   US Abdomen Limited RUQ (LIVER/GB)  Result Date: 07/21/2021 CLINICAL DATA:  Elevated LFTs. EXAM: ULTRASOUND ABDOMEN LIMITED RIGHT UPPER QUADRANT COMPARISON:  CT dated 05/13/2019. FINDINGS: Gallbladder: No gallstones or wall thickening visualized. No sonographic Murphy sign noted by sonographer. Common bile duct: Diameter: 5 mm Liver: The liver demonstrates a coarsened echotexture with normal echogenicity. There is slight irregularity of the liver contour. Clinical correlation is recommended to evaluate for early changes of cirrhosis. Portal vein is patent on color Doppler imaging with normal direction of blood flow towards the liver. Other: None. IMPRESSION: Coarsened liver echotexture, otherwise unremarkable right upper quadrant ultrasound. Electronically Signed   By: Anner Crete McconnellD.   On: 07/21/2021 23:52     Assessment and Plan:   Aortic stenosis -I strongly suspect her symptoms is related  to worsening aortic stenosis.  Her last echocardiogram was approximately 3  years ago showing moderate to severe AS.  She has 60-months history of progressive worsening dyspnea on exertion.  Also mild volume overload recently related to excess salt intake and elevated blood pressure. -Pending reading of echocardiogram to confirm diagnosis -Per ED provider note there was pericardial effusion noted on bedside echocardiogram.  Fortunately patient is stable with normal blood pressure and heart rate currently.  Patient was laying comfortably during my evaluation.  However reports with minimal exertion she will get really short winded.  2.  Nonobstructive CAD with elevated troponin -50% RCA stenosis by cardiac catheterization in 2020 -Intermittent substernal chest pressure -Chest pain-free -As above -Home Crestor on hold due to elevated LFTs -Add aspirin 81 mg daily  3.  History of Takotsubo cardiomyopathy improved LV function -Pending echocardiogram reading today  4.  Acute on chronic diastolic heart failure -Given IV Lasix 40 mg x 1 -Likely patient will need low-dose Lasix  5.  Carotid artery disease -Carotid doppler 06/2019:  right ICA with a 40-59% stenosis & left ICA with a 1-39% stenosis.  - Will need repeat study  6. HTN - BP stable on lopressor 12.5mg  BID and Lisinopril 25mg  qd  7.  Transaminitis -Unremarkable right upper quadrant ultrasound -Home Crestor on hold  Dr. Acie Fredrickson to see.   Risk Assessment/Risk Scores:        New York Heart Association (NYHA) Functional Class NYHA Class III        For questions or updates, please contact [facility] Please consult [email or web address] for contact info under    Jarrett Soho, PA  07/22/2021 9:56 AM   Attending Note:   The patient was seen and examined.  Agree with assessment and plan as noted above.  Changes made to the above note as needed.  Patient seen and independently examined with Robbie Lis, PA .   We discussed all aspects of the encounter. I agree with the assessment and plan  as stated above.     Progressive dyspnea / DOE:  has had months of progressive DOE.   Exercise intolerence.   Still going to silver sneakers but is limited in how much she can do  Her AV gradient has incresed to 33 ( from 20) echo today did not show the AV very well I think she needs a TEE  Schedule is full tomorrow    I think she may also need a R and L heart cath - although she has had caths in the past .  We are able to get a R and L heart cath for tomorrow. I have discussed the risks, benefits, optins with her and her daughter, Earnest Bailey.  They understand an agree to proceed.   I have reviewed the echo. Her LV contractility is very hypercontractile It looks like she would benefit from an increased dose of metoprolol     I have spent a total of 40 minutes with patient reviewing hospital  notes , telemetry, EKGs, labs and examining patient as well as establishing an assessment and plan that was discussed with the patient.  > 50% of time was spent in direct patient care.    Thayer Headings, Brooke Bonito., MD, [facility] 07/22/2021, 4:13 PM [address]  [place] Pager [phone]

## 2021-07-22 NOTE — Assessment & Plan Note (Signed)
Right upper quadrant ultrasound showed possible early cirrhosis Could be secondary to underlying cardiogenic fluid overload. Noted elevated LFTs.  Obtain hepatitis serologies would likely benefit from further GI evaluation

## 2021-07-22 NOTE — Assessment & Plan Note (Signed)
Restart lisinopril changed Toprol to metoprolol twice daily For better titration

## 2021-07-22 NOTE — ED Notes (Signed)
[facility] called for transport. 

## 2021-07-22 NOTE — Assessment & Plan Note (Signed)
Based on CT scan results.  In the past cardiac catheterization showed no significant CAD.  Appreciate cardiology input. Resume home medications when able to tolerate currently no chest pain Slightly elevated troponin stable flAT

## 2021-07-22 NOTE — Assessment & Plan Note (Addendum)
Hold Crestor given elevated LFTs 

## 2021-07-22 NOTE — Assessment & Plan Note (Signed)
History of MAC.  Bronchiectasis may be contributing to dyspnea.  Would benefit from pulmonology consult in a.m. please consult continue azithromycin for now would defer to pulmonology for further treatment

## 2021-07-22 NOTE — Progress Notes (Signed)
Echocardiogram 2D Echocardiogram has been performed.  Warren Lacy Makana Feigel RDCS 07/22/2021, 8:33 AM  Notified Dr. Duke Salvia of stat echo

## 2021-07-23 ENCOUNTER — Encounter (HOSPITAL_COMMUNITY): Payer: Self-pay | Admitting: Interventional Cardiology

## 2021-07-23 ENCOUNTER — Encounter (HOSPITAL_COMMUNITY)
Admission: EM | Disposition: A | Discharge status: Home / Self Care | Payer: Self-pay | Source: Ambulatory Visit | Attending: Internal Medicine

## 2021-07-23 DIAGNOSIS — I350 Nonrheumatic aortic (valve) stenosis: Secondary | ICD-10-CM | POA: Diagnosis not present

## 2021-07-23 DIAGNOSIS — I2510 Atherosclerotic heart disease of native coronary artery without angina pectoris: Secondary | ICD-10-CM | POA: Diagnosis not present

## 2021-07-23 DIAGNOSIS — I3139 Other pericardial effusion (noninflammatory): Secondary | ICD-10-CM | POA: Diagnosis not present

## 2021-07-23 HISTORY — PX: RIGHT/LEFT HEART CATH AND CORONARY ANGIOGRAPHY: CATH118266

## 2021-07-23 LAB — POCT I-STAT EG7
Acid-Base Excess: 2 mmol/L (ref 0.0–2.0)
Acid-Base Excess: 3 mmol/L — ABNORMAL HIGH (ref 0.0–2.0)
Bicarbonate: 28.5 mmol/L — ABNORMAL HIGH (ref 20.0–28.0)
Bicarbonate: 29.2 mmol/L — ABNORMAL HIGH (ref 20.0–28.0)
Calcium, Ion: 1.27 mmol/L (ref 1.15–1.40)
Calcium, Ion: 1.27 mmol/L (ref 1.15–1.40)
HCT: 32 % — ABNORMAL LOW (ref 36.0–46.0)
HCT: 31 % — ABNORMAL LOW (ref 36.0–46.0)
Hemoglobin: 10.9 g/dL — ABNORMAL LOW (ref 12.0–15.0)
Hemoglobin: 10.5 g/dL — ABNORMAL LOW (ref 12.0–15.0)
O2 Saturation: 64 %
O2 Saturation: 64 %
Potassium: 3.9 mmol/L (ref 3.5–5.1)
Potassium: 3.8 mmol/L (ref 3.5–5.1)
Sodium: 128 mmol/L — ABNORMAL LOW (ref 135–145)
Sodium: 128 mmol/L — ABNORMAL LOW (ref 135–145)
TCO2: 30 mmol/L (ref 22–32)
TCO2: 31 mmol/L (ref 22–32)
pCO2, Ven: 52 mmHg (ref 44.0–60.0)
pCO2, Ven: 52.3 mmHg (ref 44.0–60.0)
pH, Ven: 7.347 (ref 7.250–7.430)
pH, Ven: 7.354 (ref 7.250–7.430)
pO2, Ven: 35 mmHg (ref 32.0–45.0)
pO2, Ven: 35 mmHg (ref 32.0–45.0)

## 2021-07-23 LAB — POCT I-STAT 7, (LYTES, BLD GAS, ICA,H+H)
Acid-Base Excess: 1 mmol/L (ref 0.0–2.0)
Bicarbonate: 27.1 mmol/L (ref 20.0–28.0)
Calcium, Ion: 1.28 mmol/L (ref 1.15–1.40)
HCT: 31 % — ABNORMAL LOW (ref 36.0–46.0)
Hemoglobin: 10.5 g/dL — ABNORMAL LOW (ref 12.0–15.0)
O2 Saturation: 96 %
Potassium: 3.8 mmol/L (ref 3.5–5.1)
Sodium: 128 mmol/L — ABNORMAL LOW (ref 135–145)
TCO2: 28 mmol/L (ref 22–32)
pCO2 arterial: 47 mmHg (ref 32.0–48.0)
pH, Arterial: 7.368 (ref 7.350–7.450)
pO2, Arterial: 88 mmHg (ref 83.0–108.0)

## 2021-07-23 LAB — BASIC METABOLIC PANEL
Anion gap: 11 (ref 5–15)
BUN: 18 mg/dL (ref 8–23)
CO2: 25 mmol/L (ref 22–32)
Calcium: 8.9 mg/dL (ref 8.9–10.3)
Chloride: 93 mmol/L — ABNORMAL LOW (ref 98–111)
Creatinine, Ser: 0.95 mg/dL (ref 0.44–1.00)
GFR, Estimated: 59 mL/min — ABNORMAL LOW (ref >60)
Glucose, Bld: 106 mg/dL — ABNORMAL HIGH (ref 70–99)
Potassium: 4.1 mmol/L (ref 3.5–5.1)
Sodium: 129 mmol/L — ABNORMAL LOW (ref 135–145)

## 2021-07-23 LAB — HEPATITIS PANEL, ACUTE
HCV Ab: NONREACTIVE
Hep A IgM: REACTIVE — AB
Hep B C IgM: NONREACTIVE
Hepatitis B Surface Ag: NONREACTIVE

## 2021-07-23 SURGERY — RIGHT/LEFT HEART CATH AND CORONARY ANGIOGRAPHY
Anesthesia: LOCAL

## 2021-07-23 MED ORDER — DILTIAZEM LOAD VIA INFUSION
10.0000 mg | Freq: Once | INTRAVENOUS | Status: AC
  Administered 2021-07-23: 16:00:00 10 mg via INTRAVENOUS
  Filled 2021-07-23: qty 10

## 2021-07-23 MED ORDER — SODIUM CHLORIDE 0.9% FLUSH: 3.0000 mL | INTRAVENOUS | Status: DC | PRN

## 2021-07-23 MED ORDER — SODIUM CHLORIDE 0.9 % IV SOLN: 250.0000 mL | INTRAVENOUS | Status: DC | PRN

## 2021-07-23 MED ORDER — SODIUM CHLORIDE 0.9% FLUSH
3.0000 mL | Freq: Two times a day (BID) | INTRAVENOUS | Status: DC
  Administered 2021-07-24 – 2021-07-30 (×10): 3 mL via INTRAVENOUS

## 2021-07-23 MED ORDER — FENTANYL CITRATE (PF) 100 MCG/2ML IJ SOLN
INTRAMUSCULAR | Status: DC | PRN
  Administered 2021-07-23: 25 ug via INTRAVENOUS

## 2021-07-23 MED ORDER — HEPARIN (PORCINE) IN NACL 1000-0.9 UT/500ML-% IV SOLN
INTRAVENOUS | Status: DC | PRN
  Administered 2021-07-23 (×2): 500 mL

## 2021-07-23 MED ORDER — HEPARIN (PORCINE) IN NACL 1000-0.9 UT/500ML-% IV SOLN
INTRAVENOUS | Status: AC
  Filled 2021-07-23: qty 1000

## 2021-07-23 MED ORDER — MIDAZOLAM HCL 2 MG/2ML IJ SOLN
INTRAMUSCULAR | Status: DC | PRN
  Administered 2021-07-23: 1 mg via INTRAVENOUS

## 2021-07-23 MED ORDER — ACETAMINOPHEN 325 MG PO TABS
650.0000 mg | ORAL_TABLET | ORAL | Status: DC | PRN
  Administered 2021-07-27 – 2021-07-29 (×5): 650 mg via ORAL
  Filled 2021-07-23 (×5): qty 2

## 2021-07-23 MED ORDER — LIDOCAINE HCL (PF) 1 % IJ SOLN
INTRAMUSCULAR | Status: DC | PRN
  Administered 2021-07-23: 10 mL

## 2021-07-23 MED ORDER — LABETALOL HCL 5 MG/ML IV SOLN: 10.0000 mg | INTRAVENOUS | Status: AC | PRN

## 2021-07-23 MED ORDER — LIDOCAINE HCL (PF) 1 % IJ SOLN
INTRAMUSCULAR | Status: AC
  Filled 2021-07-23: qty 30

## 2021-07-23 MED ORDER — MIDAZOLAM HCL 2 MG/2ML IJ SOLN
INTRAMUSCULAR | Status: AC
  Filled 2021-07-23: qty 2

## 2021-07-23 MED ORDER — DILTIAZEM HCL-DEXTROSE 125-5 MG/125ML-% IV SOLN (PREMIX)
5.0000 mg/h | INTRAVENOUS | Status: DC
  Administered 2021-07-23 – 2021-07-25 (×3): 5 mg/h via INTRAVENOUS
  Filled 2021-07-23 (×3): qty 125

## 2021-07-23 MED ORDER — ONDANSETRON HCL 4 MG/2ML IJ SOLN: 4.0000 mg | Freq: Four times a day (QID) | INTRAMUSCULAR | Status: DC | PRN

## 2021-07-23 MED ORDER — HYDRALAZINE HCL 20 MG/ML IJ SOLN: 10.0000 mg | INTRAMUSCULAR | Status: AC | PRN

## 2021-07-23 MED ORDER — ONDANSETRON HCL 4 MG/2ML IJ SOLN
4.0000 mg | Freq: Once | INTRAMUSCULAR | Status: AC
  Administered 2021-07-23: 09:00:00 4 mg via INTRAVENOUS
  Filled 2021-07-23: qty 2

## 2021-07-23 MED ORDER — FENTANYL CITRATE (PF) 100 MCG/2ML IJ SOLN
INTRAMUSCULAR | Status: AC
  Filled 2021-07-23: qty 2

## 2021-07-23 MED ORDER — IOHEXOL 350 MG/ML SOLN
INTRAVENOUS | Status: DC | PRN
  Administered 2021-07-23: 10:00:00 40 mL

## 2021-07-23 SURGICAL SUPPLY — 16 items
CATH INFINITI 5FR AL1 (CATHETERS) ×1 IMPLANT
CATH INFINITI 5FR MULTPACK ANG (CATHETERS) ×1 IMPLANT
CATH SWAN GANZ 7F STRAIGHT (CATHETERS) ×1 IMPLANT
GLIDESHEATH SLEND SS 6F .021 (SHEATH) IMPLANT
GUIDEWIRE INQWIRE 1.5J.035X260 (WIRE) IMPLANT
INQWIRE 1.5J .035X260CM (WIRE)
KIT HEART LEFT (KITS) ×2 IMPLANT
PACK CARDIAC CATHETERIZATION (CUSTOM PROCEDURE TRAY) ×2 IMPLANT
SHEATH GLIDE SLENDER 4/5FR (SHEATH) IMPLANT
SHEATH PINNACLE 5F 10CM (SHEATH) ×1 IMPLANT
SHEATH PINNACLE 7F 10CM (SHEATH) ×1 IMPLANT
SYR MEDRAD MARK 7 150ML (SYRINGE) ×2 IMPLANT
TRANSDUCER W/STOPCOCK (MISCELLANEOUS) ×2 IMPLANT
TUBING CIL FLEX 10 FLL-RA (TUBING) ×2 IMPLANT
WIRE EMERALD 3MM-J .035X150CM (WIRE) ×1 IMPLANT
WIRE EMERALD ST .035X260CM (WIRE) ×1 IMPLANT

## 2021-07-23 NOTE — Plan of Care (Signed)
  Problem: Education: Goal: Ability to demonstrate management of disease process will improve Outcome: Progressing Goal: Ability to verbalize understanding of medication therapies will improve Outcome: Progressing   

## 2021-07-23 NOTE — Progress Notes (Signed)
OT Cancellation Note  Patient Details Name: Barbara Mcconnell MRN: [id number] DOB: April 06, 1935   Cancelled Treatment:    Reason Eval/Treat Not Completed: Patient not medically ready. Pt now with active bed rest order, will reattempt as schedule allows.  Alfonzo Beers, OTD, OTR/L Acute Rehab [phone]   Mayer Masker 07/23/2021, 11:35 AM

## 2021-07-23 NOTE — Progress Notes (Signed)
Progress Note  Patient Name: Barbara Mcconnell Date of Encounter: 07/23/2021  [facility] Cardiologist: Peter Martinique, MD    Subjective    86 yo with progressive DOE , hx of mild CAD  Has mod - severe AS - possibly worse   Cath this am showed minimal CAD ( no changes from previous )  Unable to cross the AV   Inpatient Medications    Scheduled Meds:  aspirin EC  81 mg Oral Daily   azithromycin  250 mg Oral Daily   cholecalciferol  1,000 Units Oral Daily   docusate sodium  100 mg Oral Q breakfast   escitalopram  5 mg Oral Daily   lidocaine  1 patch Transdermal Q24H   lisinopril  25 mg Oral Daily   loratadine  10 mg Oral Daily   metoprolol tartrate  25 mg Oral BID   montelukast  10 mg Oral QPC supper   pantoprazole  40 mg Oral Daily   sodium chloride flush  3 mL Intravenous Q12H   Continuous Infusions:  sodium chloride     PRN Meds: sodium chloride, acetaminophen, acetaminophen, albuterol, hydrALAZINE, labetalol, ondansetron (ZOFRAN) IV, sodium chloride flush   Vital Signs    Vitals:   07/23/21 1100 07/23/21 1105 07/23/21 1110 07/23/21 1142  BP: (!) 142/71 120/60 121/65 127/79  Pulse: 77 79 81 83  Resp: 19 20 20    Temp:      TempSrc:      SpO2: 93% 91% (!) 88% 96%  Weight:      Height:       No intake or output data in the 24 hours ending 07/23/21 1328 Last 3 Weights 07/22/2021 06/30/2021 07/11/2019  Weight (lbs) 130 lb 8 oz 131 lb 9.6 oz 128 lb  Weight (kg) 59.194 kg 59.693 kg 58.06 kg      Telemetry     - Personally Reviewed  ECG     - Personally Reviewed  Physical Exam   GEN:  elderly female   Neck: No JVD Cardiac: RRR,  + systolic murmur  Respiratory: Clear to auscultation bilaterally. GI: Soft, nontender, non-distended  MS: No edema; No deformity. Neuro:  Nonfocal  Psych: Normal affect   Labs    High Sensitivity Troponin:   Recent Labs  Lab 06/30/21 1532 07/21/21 1948 07/21/21 2152 07/22/21 0022 07/22/21 0421  TROPONINIHS 13 63*  56* 51* 48*     Chemistry Recent Labs  Lab 07/21/21 1948 07/22/21 0421 07/23/21 0745 07/23/21 0937 07/23/21 0942  NA 128* 131* 129* 128* 128*  K 4.2 3.9 4.1 3.8 3.9  CL 93* 93* 93*  --   --   CO2 27 28 25   --   --   GLUCOSE 112* 109* 106*  --   --   BUN 20 21 18   --   --   CREATININE 0.93 1.09* 0.95  --   --   CALCIUM 8.9 9.1 8.9  --   --   MG 2.1 2.2  --   --   --   PROT 7.1 6.6  --   --   --   ALBUMIN 3.5 3.2*  --   --   --   AST 106* 70*  --   --   --   ALT 121* 99*  --   --   --   ALKPHOS 205* 175*  --   --   --   BILITOT 0.8 0.9  --   --   --  GFRNONAA >60 50* 59*  --   --   ANIONGAP 8 10 11   --   --     Lipids No results for input(s): CHOL, TRIG, HDL, LABVLDL, LDLCALC, CHOLHDL in the last 168 hours.  Hematology Recent Labs  Lab 07/21/21 1948 07/22/21 0421 07/23/21 0937 07/23/21 0942  WBC 8.4 7.5  --   --   RBC 3.88 3.70*  --   --   HGB 11.5* 11.3* 10.5* 10.9*  HCT 35.6* 33.6* 31.0* 32.0*  MCV 91.8 90.8  --   --   MCH 29.6 30.5  --   --   MCHC 32.3 33.6  --   --   RDW 13.4 13.4  --   --   PLT 267 275  --   --    Thyroid  Recent Labs  Lab 07/22/21 0421  TSH 1.472    BNP Recent Labs  Lab 07/21/21 1948  BNP 201.8*    DDimer  Recent Labs  Lab 07/21/21 1948  DDIMER 1.58*     Radiology    DG Chest 2 View  Result Date: 07/21/2021 CLINICAL DATA:  Shortness of breath. EXAM: CHEST - 2 VIEW COMPARISON:  Chest radiograph dated 07/18/2021. FINDINGS: Cardiomegaly with vascular congestion. Probable small bilateral pleural effusions and bibasilar atelectasis or infiltrate. No pneumothorax. Atherosclerotic calcification of the aorta. No acute osseous pathology. IMPRESSION: Cardiomegaly with vascular congestion and small bilateral pleural effusions. Electronically Signed   By: 07/20/2021 M.D.   On: 07/21/2021 19:28   CARDIAC CATHETERIZATION  Result Date: 07/23/2021   Prox RCA lesion is 50% stenosed.   Prox Cx to Mid Cx lesion is 25% stenosed.    Prox LAD to Mid LAD lesion is 25% stenosed.   Hemodynamic findings consistent with pulmonary hypertension. Stable, mild to moderate coronary artery disease.  No indication for PCI. Unable to cross aortic valve, consistent with worsening noted on echocardiogram.  Continue plans for TAVR w/u.   ECHOCARDIOGRAM COMPLETE  Result Date: 07/22/2021    ECHOCARDIOGRAM REPORT   Patient Name:   Barbara Mcconnell Date of Exam: 07/22/2021 Medical Rec #:  07/24/2021    Height:       61.0 in Accession #:    [id number]   Weight:       131.6 lb Date of Birth:  [birth year]     BSA:          1.581 m Patient Age:    86 years     BP:           155/92 mmHg Patient Gender: F            HR:           102 bpm. Exam Location:  Inpatient Procedure: 2D Echo, Color Doppler and Cardiac Doppler STAT ECHO Indications:    I31.3 Pericardial effusion  History:        Patient has prior history of Echocardiogram examinations, most                 recent 09/23/2019. Risk Factors:Hypertension and Dyslipidemia.                 Remote history of Takotsubo x2.  Sonographer:    11/23/2019 Senior RDCS Referring Phys: Irving Burton ANASTASSIA DOUTOVA IMPRESSIONS  1. Left ventricular ejection fraction, by estimation, is 60 to 65%. The left ventricle has normal function. The left ventricle has no regional wall motion abnormalities. There is moderate concentric left ventricular hypertrophy. Left ventricular diastolic parameters are  consistent with Grade I diastolic dysfunction (impaired relaxation). Elevated left ventricular end-diastolic pressure.  2. Right ventricular systolic function is normal. The right ventricular size is normal. There is normal pulmonary artery systolic pressure.  3. Left atrial size was severely dilated.  4. Pericardial effusion measuring 1.5 cm. Increased compared with 09/2019. . Moderate pericardial effusion. The pericardial effusion is circumferential. There is no evidence of cardiac tamponade.  5. The mitral valve is degenerative. Trivial mitral valve  regurgitation. No evidence of mitral stenosis. Moderate mitral annular calcification.  6. AoV not well visualized. Appears calcified. Vmax 3.4 m/s, MG 33 mmHG, AVA 1.38 cm2, DI 0.29. Moderate aortic stenosis. Mean gradient has increased from 20 mmHg 09/2019 to 33 mmHg now. The aortic valve is tricuspid. There is moderate calcification of the aortic valve. There is moderate thickening of the aortic valve. Aortic valve regurgitation is mild. Moderate aortic valve stenosis. Aortic valve area, by VTI measures 1.38 cm. Aortic valve mean gradient measures 33.0 mmHg. Aortic valve Vmax measures  3.41 m/s.  7. Aortic dilatation noted. There is mild dilatation of the ascending aorta, measuring 37 mm.  8. The inferior vena cava is dilated in size with >50% respiratory variability, suggesting right atrial pressure of 8 mmHg. FINDINGS  Left Ventricle: Left ventricular ejection fraction, by estimation, is 60 to 65%. The left ventricle has normal function. The left ventricle has no regional wall motion abnormalities. The left ventricular internal cavity size was normal in size. There is  moderate concentric left ventricular hypertrophy. Left ventricular diastolic parameters are consistent with Grade I diastolic dysfunction (impaired relaxation). Elevated left ventricular end-diastolic pressure. Right Ventricle: The right ventricular size is normal. No increase in right ventricular wall thickness. Right ventricular systolic function is normal. There is normal pulmonary artery systolic pressure. The tricuspid regurgitant velocity is 1.66 m/s, and  with an assumed right atrial pressure of 8 mmHg, the estimated right ventricular systolic pressure is [id number] mmHg. Left Atrium: Left atrial size was severely dilated. Right Atrium: Right atrial size was normal in size. Pericardium: Pericardial effusion measuring 1.5 cm. Increased compared with 09/2019. A moderately sized pericardial effusion is present. The pericardial effusion is  circumferential. There is no evidence of cardiac tamponade. Presence of epicardial fat layer. Thickening/calcification of pericardium present. Mitral Valve: The mitral valve is degenerative in appearance. Moderate mitral annular calcification. Trivial mitral valve regurgitation. No evidence of mitral valve stenosis. MV peak gradient, 17.1 mmHg. The mean mitral valve gradient is 7.0 mmHg. Tricuspid Valve: The tricuspid valve is grossly normal. Tricuspid valve regurgitation is trivial. No evidence of tricuspid stenosis. Aortic Valve: AoV not well visualized. Appears calcified. Vmax 3.4 m/s, MG 33 mmHG, AVA 1.38 cm2, DI 0.29. Moderate aortic stenosis. Mean gradient has increased from 20 mmHg 09/2019 to 33 mmHg now. The aortic valve is tricuspid. There is moderate calcification of the aortic valve. There is moderate thickening of the aortic valve. Aortic valve regurgitation is mild. Moderate aortic stenosis is present. Aortic valve mean gradient measures 33.0 mmHg. Aortic valve peak gradient measures 46.5 mmHg. Aortic valve area, by VTI measures 1.38 cm. Pulmonic Valve: The pulmonic valve was grossly normal. Pulmonic valve regurgitation is trivial. No evidence of pulmonic stenosis. Aorta: Aortic dilatation noted. There is mild dilatation of the ascending aorta, measuring 37 mm. Venous: The inferior vena cava is dilated in size with greater than 50% respiratory variability, suggesting right atrial pressure of 8 mmHg. IAS/Shunts: The atrial septum is grossly normal.  LEFT VENTRICLE PLAX 2D LVIDd:  2.50 cm   Diastology LVIDs:         1.80 cm   LV e' medial:    5.66 cm/s LV PW:         1.30 cm   LV E/e' medial:  16.5 LV IVS:        1.60 cm   LV e' lateral:   3.48 cm/s LVOT diam:     1.91 cm   LV E/e' lateral: 26.8 LV SV:         85 LV SV Index:   54 LVOT Area:     2.87 cm  RIGHT VENTRICLE RV S prime:     13.30 cm/s TAPSE (M-mode): 2.3 cm LEFT ATRIUM             Index        RIGHT ATRIUM           Index LA diam:         2.90 cm 1.83 cm/m   RA Area:     14.50 cm LA Vol (A2C):   44.7 ml 28.27 ml/m  RA Volume:   36.00 ml  22.77 ml/m LA Vol (A4C):   69.0 ml 43.64 ml/m LA Biplane Vol: 56.0 ml 35.42 ml/m  AORTIC VALVE AV Area (Vmax):    1.41 cm AV Area (Vmean):   1.25 cm AV Area (VTI):     1.38 cm AV Vmax:           341.00 cm/s AV Vmean:          280.000 cm/s AV VTI:            0.614 m AV Peak Grad:      46.5 mmHg AV Mean Grad:      33.0 mmHg LVOT Vmax:         168.00 cm/s LVOT Vmean:        122.000 cm/s LVOT VTI:          0.296 m LVOT/AV VTI ratio: 0.48  AORTA Ao Root diam: 3.20 cm Ao Asc diam:  3.70 cm MITRAL VALVE                TRICUSPID VALVE MV Area (PHT): 4.24 cm     TR Peak grad:   11.0 mmHg MV Peak grad:  17.1 mmHg    TR Vmax:        166.00 cm/s MV Mean grad:  7.0 mmHg MV Vmax:       2.07 m/s     SHUNTS MV Vmean:      129.0 cm/s   Systemic VTI:  0.30 m MV Decel Time: 179 msec     Systemic Diam: 1.91 cm MV E velocity: 93.40 cm/s MV A velocity: 179.00 cm/s MV E/A ratio:  0.52 Skeet Latch MD Electronically signed by Skeet Latch MD Signature Date/Time: 07/22/2021/10:57:57 AM    Final    US Abdomen Limited RUQ (LIVER/GB)  Result Date: 07/21/2021 CLINICAL DATA:  Elevated LFTs. EXAM: ULTRASOUND ABDOMEN LIMITED RIGHT UPPER QUADRANT COMPARISON:  CT dated 05/13/2019. FINDINGS: Gallbladder: No gallstones or wall thickening visualized. No sonographic Murphy sign noted by sonographer. Common bile duct: Diameter: 5 mm Liver: The liver demonstrates a coarsened echotexture with normal echogenicity. There is slight irregularity of the liver contour. Clinical correlation is recommended to evaluate for early changes of cirrhosis. Portal vein is patent on color Doppler imaging with normal direction of blood flow towards the liver. Other: None. IMPRESSION: Coarsened liver echotexture, otherwise unremarkable right upper quadrant  ultrasound. Electronically Signed   By: Anner Crete M.D.   On: 07/21/2021 23:52     Cardiac Studies     Patient Profile     86 y.o. female    [place]     1.  AS :  likely more severe than we suspected .  Will need a TEE .  I have consulted the TAVR team to see along with [place] to help [place] make the decision about whether or not she would benefit form a TAVR .    2. CAD :  mild , insignificant    For questions or updates, please contact [place] Please consult [email or web address] for contact info under        Signed, Mertie Moores, MD  07/23/2021, 1:28 PM

## 2021-07-23 NOTE — Consult Note (Addendum)
[place] VALVE TEAM  Cardiology Consultation:   Patient ID: Barbara Mcconnell MRN: [id number]; DOB: 07-Aug-1934  Admit date: 07/21/2021 Date of Consult: 07/23/2021  Primary Care Provider: Kristopher Glee., MD [facility] Cardiologist: Peter Martinique, MD   Patient Profile:   Barbara Mcconnell is a 86 y.o. female with a hx of HTN, HLD with statin intolerance, peripheral vascular disease with moderate carotid artery stenosis and hx of Takotsubo cardiomyopathy with normalization who is being seen today for the evaluation of moderate aortic stenosis at the request of Dr. Cathie Olden.  History of Present Illness:   [name] is a very functional 86yo who lives alone in [place]. She has one daughter who lives locally who is very involved with her care. She states that up until about 5 weeks ago she was doing very well with no issues at all. She began to notice more exertional dyspnea. Activities which used to be easy for her became much harder due to SOB. She denies chest pain, palpitations, orthopnea, LE edema, dizziness, or syncope. She participates in regular exercise with silver sneakers three times per week without an issues. She performs all her own housework and continues to drive herself. More recently, as her symptoms progressed, she followed closely with her PCP along with a pulmonary team. 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 She then saw her PCP 07/21/21 and was found to be tachypneic and in mild respiratory distress and was therefore sent to the ED for further assessment. On arrival she was quite hypertensive with a BP at 201/109. Bedside echocardiogram performed by EDP showed pericardial effusion. CXR showed vascular congestion and small bilateral pleural effusion. BNP 201. HsT 63>>56>>51>>48. LFTs elevated however are now down trending with diuresis.  Creatine 0.93>>1.09. Right upper quarter ultrasound was unremarkable. Repeat formal echocardiogram showed normal LVEF with a moderate circumfrential pericardial effusion measuring 1.5 cm. There was moderate AS with a mean gradient at 52mHg (was 20.578mg), AVA 1.38cm and DI 0.29. She underwent R/LHC today 07/23/21 which showed stable, mild to moderate CAD with hemodynamics consistent with pulmonary hypertension. MD unable to cross aortic valve during procedure. With discrepancies between echocardiogram findings and inability to cross AV, will plan to proceed with further evaluation of her AV with TEE with structural heart reader and full structural heart consultation.   She was initially seen by Dr. [facility]fter [facility]. She had previously been hospitalized about 5 years prior and was told that she had Takotsubo cardiomyopathy with mild non-obstructive CAD which had been treated medically. She reported normalization of her cardiac function although there are no records of her prior cath or echocardiogram reports. She had been doing very well but developed sub sternal chest pain with weakness and diaphoresis and [facility], found to have non-obstructive CAD per cardiac catheterization with severe LV dysfunction at 35-40% with wall motion abnormalities consistent with Takotsubo's cardiomyopathy. Echocardiogram 05/14/19 confirmed EF at 35-40% with moderate AS with a mean gradient at 21.5 mmHg, peak gradient at 44.7 mmHg, and AVA by VTI measuring 2.64 cm. Her medications were adjusted and she was seen in cardiology follow up several times in 2020 however has been lost to follow up until her current admission. It appears that she had a follow up echocardiogram 09/2019 which showed normalized LV function to 60-65% with  no wall motion abnormalities moderate to severe aortic valve stenosis with AVA at 0.85 cm, mean gradient measures 20.5 mmHg and  peak gradient at 31.56mHg.   Past Medical History:  Diagnosis Date   Aortic stenosis    CAD (coronary artery disease)    cath 04/2019: 50% pRCA   Coronary artery disease    Hyperlipemia    Hypertension    Takotsubo cardiomyopathy     Past Surgical History:  Procedure Laterality Date   RIGHT/LEFT HEART CATH AND CORONARY ANGIOGRAPHY N/A 05/13/2019   Procedure: RIGHT/LEFT HEART CATH AND CORONARY ANGIOGRAPHY;  Surgeon: JMartinique Peter M, MD;  [facility];  Service: Cardiovascular;  Laterality: N/A;   TONSILLECTOMY      Inpatient Medications: Scheduled Meds:  aspirin EC  81 mg Oral Daily   azithromycin  250 mg Oral Daily   cholecalciferol  1,000 Units Oral Daily   docusate sodium  100 mg Oral Q breakfast   escitalopram  5 mg Oral Daily   lidocaine  1 patch Transdermal Q24H   lisinopril  25 mg Oral Daily   loratadine  10 mg Oral Daily   metoprolol tartrate  25 mg Oral BID   montelukast  10 mg Oral QPC supper   pantoprazole  40 mg Oral Daily   Continuous Infusions:  PRN Meds: acetaminophen, acetaminophen, albuterol, hydrALAZINE, labetalol, ondansetron (ZOFRAN) IV  Allergies:    Allergies  Allergen Reactions   Codeine Nausea And Vomiting and Nausea Only   Cyclobenzaprine    Iodinated Contrast Media    Prednisone Other (See Comments)    Sores in mouth   Social History:   Social History   Socioeconomic History   Marital status: Divorced    Spouse name: Not on file   Number of children: Not on file   Years of education: Not on file   Highest education level: Not on file  Occupational History   Not on file  Tobacco Use   Smoking status: Never   Smokeless tobacco: Never  Vaping Use   Vaping Use: Never used  Substance and Sexual Activity   Alcohol use: No   Drug use: No   Sexual activity: Not Currently  Other Topics Concern   Not on file  Social History Narrative   Not on file   Social Determinants of Health   Financial Resource Strain: Not  on file  Food Insecurity: Not on file  Transportation Needs: Not on file  Physical Activity: Not on file  Stress: Not on file  Social Connections: Not on file  Intimate Partner Violence: Not on file    Family History:    Family History  Problem Relation Age of Onset   Hypertension Other     ROS:  Please see the history of present illness.   All other ROS reviewed and negative.     Physical Exam/Data:   Vitals:   07/23/21 1055 07/23/21 1100 07/23/21 1105 07/23/21 1110  BP: 132/65 (!) 142/71 120/60 121/65  Pulse: 67 77 79 81  Resp: _0 Temp:      TempSrc:      SpO2: 95% 93% 91% (!) 88%  Weight:      Height:       No intake or output data in the 24 hours ending 07/23/21 1132 Last 3 Weights 07/22/2021 06/30/2021 07/11/2019  Weight (lbs) 130 lb 8 oz 131 lb 9.6 oz 128 lb  Weight (kg) 59.194 kg 59.693 kg 58.06 kg  Body mass index is 25.49 kg/m.   General: Well developed, well nourished, NAD Lungs:Clear to ausculation bilaterally. Breathing is unlabored. Cardiovascular: RRR with S1 S2. + harsh systolic murmur.  Abdomen: Soft, non-tender, non-distended. No obvious abdominal masses. Extremities: No edema.  Neuro: Alert and oriented. No focal deficits. No facial asymmetry. MAE spontaneously. Psych: Responds to questions appropriately with normal affect.    EKG:  The EKG was personally reviewed and demonstrates: 07/22/21 sinus tachycardia with HR 108bpm with frequent PACs.  Telemetry:  Telemetry was personally reviewed and demonstrates: 07/23/21 NSR with HR in the 90's with PACs.   Relevant CV Studies:  [facility] 07/23/21:    Prox RCA lesion is 50% stenosed.   Prox Cx to Mid Cx lesion is 25% stenosed.   Prox LAD to Mid LAD lesion is 25% stenosed.   Hemodynamic findings consistent with pulmonary hypertension.   Stable, mild to moderate coronary artery disease.  No indication for PCI.   Unable to cross aortic valve, consistent with worsening noted on  echocardiogram.  Continue plans for TAVR w/u.  Echocardiogram 07/22/21:  IMPRESSIONS Left ventricular ejection fraction, by estimation, is 60 to 65%. The left ventricle has normal function. The left ventricle has no regional wall motion abnormalities. There is moderate concentric left ventricular hypertrophy. Left ventricular diastolic parameters are consistent with Grade I diastolic dysfunction (impaired relaxation). Elevated left ventricular end-diastolic pressure. 1.Right ventricular systolic function is normal. The right ventricular size is normal. There is normal pulmonary artery systolic pressure. 2. 3. Left atrial size was severely dilated. Pericardial effusion measuring 1.5 cm. Increased compared with 09/2019. . Moderate pericardial effusion. The pericardial effusion is circumferential. There is no evidence of cardiac tamponade. 4. The mitral valve is degenerative. Trivial mitral valve regurgitation. No evidence of mitral stenosis. Moderate mitral annular calcification. 5. AoV not well visualized. Appears calcified. Vmax 3.4 m/s, MG 33 mmHG, AVA 1.38 cm2, DI 0.29. Moderate aortic stenosis. Mean gradient has increased from 20 mmHg 09/2019 to 33 mmHg now. The aortic valve is tricuspid. There is moderate calcification of the aortic valve. There is moderate thickening of the aortic valve. Aortic valve regurgitation is mild. Moderate aortic valve stenosis. Aortic valve area, by VTI measures 1.38 cm. Aortic valve mean gradient measures 33.0 mmHg. Aortic valve Vmax measures 3.41 m/s. 6. 7. Aortic dilatation noted. There is mild dilatation of the ascending aorta, measuring 37  Echocardiogram 09/24/2019:  1. Left ventricular ejection fraction, by estimation, is 60 to 65%. The left ventricle has normal function. The left ventricle has no regional wall motion abnormalities. There is severe asymmetric left ventricular hypertrophy of the septal segment (Sigmoid basal septum).  Left ventricular diastolic parameters are consistent with Grade II diastolic dysfunction (pseudonormalization). 2. Right ventricular systolic function is normal. The right ventricular size is normal. There is normal pulmonary artery systolic pressure. 3. Left atrial size was mildly dilated. 4. There is mild posterior mitral annular calcification. Mild to moderate mitral valve regurgitation. No evidence of mitral stenosis. 5. The aortic valve is is thickened and moderately calcified with annular calcification. Aortic valve regurgitation is mild to moderate. Moderate to severe aortic valve stenosis. Aortic valve area, by VTI measures 0.85 cm. Aortic valve mean gradient measures 20.5 mmHg. Aortic valve Vmax measures 2.81 m/s. 6. Compared to prior study done is October 2020, there have been improvement in the EF which was 30-35% with wall motion abnormalities. Now EF is 60-65% with no wall motion abnormality. In addition, the Aortic stenosis is now moderate to severe  was moderate.  R/LHC 05/13/2019:  Prox RCA lesion is 50% stenosed. There is moderate to severe left ventricular systolic dysfunction. LV end diastolic pressure is moderately elevated. The left ventricular ejection fraction is 35-45% by visual estimate. There is trivial (1+) mitral regurgitation. Hemodynamic findings consistent with moderate pulmonary hypertension.   1. Nonobstructive CAD 2. Moderate to severe LV dysfunction. EF 35-40%. Wall motion abnormality classic for Takotsubo's cardiomyopathy 3. Moderately elevated LV filling pressures 4. Moderate pulmonary venous HTN 5. Reduced cardiac output. CI 2.3   Plan: patient was SOB in the lab. We administered IV Ntg and lasix in the lab and increased oxygen with improvement in her symptoms and improvement in PA sat from 62% to 66%. Will continue with IV diuresis, continue ACEi and Metoprolol. Echo.    Laboratory Data:  High Sensitivity Troponin:   Recent Labs  Lab  06/30/21 1532 07/21/21 1948 07/21/21 2152 07/22/21 0022 07/22/21 0421  TROPONINIHS 13 63* 56* 51* 48*     Chemistry Recent Labs  Lab 07/21/21 1948 07/22/21 0421 07/23/21 0745 07/23/21 0937 07/23/21 0942  NA 128* 131* 129* 128* 128*  K 4.2 3.9 4.1 3.8 3.9  CL 93* 93* 93*  --   --   CO2 _0 --   --   GLUCOSE 112* 109* 106*  --   --   BUN _1 --   --   CREATININE 0.93 1.09* 0.95  --   --   CALCIUM 8.9 9.1 8.9  --   --   GFRNONAA >60 50* 59*  --   --   ANIONGAP _2 --   --     Recent Labs  Lab 07/21/21 1948 07/22/21 0421  PROT 7.1 6.6  ALBUMIN 3.5 3.2*  AST 106* 70*  ALT 121* 99*  ALKPHOS 205* 175*  BILITOT 0.8 0.9   Hematology Recent Labs  Lab 07/21/21 1948 07/22/21 0421 07/23/21 0937 07/23/21 0942  WBC 8.4 7.5  --   --   RBC 3.88 3.70*  --   --   HGB 11.5* 11.3* 10.5* 10.9*  HCT 35.6* 33.6* 31.0* 32.0*  MCV 91.8 90.8  --   --   MCH 29.6 30.5  --   --   MCHC 32.3 33.6  --   --   RDW 13.4 13.4  --   --   PLT 267 275  --   --    BNP Recent Labs  Lab 07/21/21 1948  BNP 201.8*    DDimer  Recent Labs  Lab 07/21/21 1948  DDIMER 1.58*     Radiology/Studies:  DG Chest 2 View  Result Date: 07/21/2021 CLINICAL DATA:  Shortness of breath. EXAM: CHEST - 2 VIEW COMPARISON:  Chest radiograph dated 07/18/2021. FINDINGS: Cardiomegaly with vascular congestion. Probable small bilateral pleural effusions and bibasilar atelectasis or infiltrate. No pneumothorax. Atherosclerotic calcification of the aorta. No acute osseous pathology. IMPRESSION: Cardiomegaly with vascular congestion and small bilateral pleural effusions. Electronically Signed   By: Anner Crete M.D.   On: 07/21/2021 19:28   CARDIAC CATHETERIZATION  Result Date: 07/23/2021   Prox RCA lesion is 50% stenosed.   Prox Cx to Mid Cx lesion is 25% stenosed.   Prox LAD to Mid LAD lesion is 25% stenosed.   Hemodynamic findings consistent with pulmonary hypertension. Stable, mild to  moderate coronary artery disease.  No indication for PCI. Unable to cross aortic valve, consistent with worsening noted on echocardiogram.  Continue plans for  TAVR w/u.   ECHOCARDIOGRAM COMPLETE  Result Date: 07/22/2021    ECHOCARDIOGRAM REPORT   Patient Name:   GLENNIS BORGER Date of Exam: 07/22/2021 Medical Rec #:  [id number]    Height:       61.0 in Accession #:    [id number]   Weight:       131.6 lb Date of Birth:  09/23/1934     BSA:          1.581 m Patient Age:    60 years     BP:           155/92 mmHg Patient Gender: F            HR:           102 bpm. Exam Location:  Inpatient Procedure: 2D Echo, Color Doppler and Cardiac Doppler STAT ECHO Indications:    I31.3 Pericardial effusion  History:        Patient has prior history of Echocardiogram examinations, most                 recent 09/23/2019. Risk Factors:Hypertension and Dyslipidemia.                 Remote history of Takotsubo x2.  Sonographer:    Raquel Sarna Senior RDCS Referring Phys: [place]  1. Left ventricular ejection fraction, by estimation, is 60 to 65%. The left ventricle has normal function. The left ventricle has no regional wall motion abnormalities. There is moderate concentric left ventricular hypertrophy. Left ventricular diastolic parameters are consistent with Grade I diastolic dysfunction (impaired relaxation). Elevated left ventricular end-diastolic pressure.  2. Right ventricular systolic function is normal. The right ventricular size is normal. There is normal pulmonary artery systolic pressure.  3. Left atrial size was severely dilated.  4. Pericardial effusion measuring 1.5 cm. Increased compared with 09/2019. . Moderate pericardial effusion. The pericardial effusion is circumferential. There is no evidence of cardiac tamponade.  5. The mitral valve is degenerative. Trivial mitral valve regurgitation. No evidence of mitral stenosis. Moderate mitral annular calcification.  6. AoV not well visualized. Appears  calcified. Vmax 3.4 m/s, MG 33 mmHG, AVA 1.38 cm2, DI 0.29. Moderate aortic stenosis. Mean gradient has increased from 20 mmHg 09/2019 to 33 mmHg now. The aortic valve is tricuspid. There is moderate calcification of the aortic valve. There is moderate thickening of the aortic valve. Aortic valve regurgitation is mild. Moderate aortic valve stenosis. Aortic valve area, by VTI measures 1.38 cm. Aortic valve mean gradient measures 33.0 mmHg. Aortic valve Vmax measures  3.41 m/s.  7. Aortic dilatation noted. There is mild dilatation of the ascending aorta, measuring 37 mm.  8. The inferior vena cava is dilated in size with >50% respiratory variability, suggesting right atrial pressure of 8 mmHg. FINDINGS  Left Ventricle: Left ventricular ejection fraction, by estimation, is 60 to 65%. The left ventricle has normal function. The left ventricle has no regional wall motion abnormalities. The left ventricular internal cavity size was normal in size. There is  moderate concentric left ventricular hypertrophy. Left ventricular diastolic parameters are consistent with Grade I diastolic dysfunction (impaired relaxation). Elevated left ventricular end-diastolic pressure. Right Ventricle: The right ventricular size is normal. No increase in right ventricular wall thickness. Right ventricular systolic function is normal. There is normal pulmonary artery systolic pressure. The tricuspid regurgitant velocity is 1.66 m/s, and  with an assumed right atrial pressure of 8 mmHg, the estimated right ventricular systolic pressure is 29.5 mmHg.  Left Atrium: Left atrial size was severely dilated. Right Atrium: Right atrial size was normal in size. Pericardium: Pericardial effusion measuring 1.5 cm. Increased compared with 09/2019. A moderately sized pericardial effusion is present. The pericardial effusion is circumferential. There is no evidence of cardiac tamponade. Presence of epicardial fat layer. Thickening/calcification of pericardium  present. Mitral Valve: The mitral valve is degenerative in appearance. Moderate mitral annular calcification. Trivial mitral valve regurgitation. No evidence of mitral valve stenosis. MV peak gradient, 17.1 mmHg. The mean mitral valve gradient is 7.0 mmHg. Tricuspid Valve: The tricuspid valve is grossly normal. Tricuspid valve regurgitation is trivial. No evidence of tricuspid stenosis. Aortic Valve: AoV not well visualized. Appears calcified. Vmax 3.4 m/s, MG 33 mmHG, AVA 1.38 cm2, DI 0.29. Moderate aortic stenosis. Mean gradient has increased from 20 mmHg 09/2019 to 33 mmHg now. The aortic valve is tricuspid. There is moderate calcification of the aortic valve. There is moderate thickening of the aortic valve. Aortic valve regurgitation is mild. Moderate aortic stenosis is present. Aortic valve mean gradient measures 33.0 mmHg. Aortic valve peak gradient measures 46.5 mmHg. Aortic valve area, by VTI measures 1.38 cm. Pulmonic Valve: The pulmonic valve was grossly normal. Pulmonic valve regurgitation is trivial. No evidence of pulmonic stenosis. Aorta: Aortic dilatation noted. There is mild dilatation of the ascending aorta, measuring 37 mm. Venous: The inferior vena cava is dilated in size with greater than 50% respiratory variability, suggesting right atrial pressure of 8 mmHg. IAS/Shunts: The atrial septum is grossly normal.  LEFT VENTRICLE PLAX 2D LVIDd:         2.50 cm   Diastology LVIDs:         1.80 cm   LV e' medial:    5.66 cm/s LV PW:         1.30 cm   LV E/e' medial:  16.5 LV IVS:        1.60 cm   LV e' lateral:   3.48 cm/s LVOT diam:     1.91 cm   LV E/e' lateral: 26.8 LV SV:         85 LV SV Index:   54 LVOT Area:     2.87 cm  RIGHT VENTRICLE RV S prime:     13.30 cm/s TAPSE (M-mode): 2.3 cm LEFT ATRIUM             Index        RIGHT ATRIUM           Index LA diam:        2.90 cm 1.83 cm/m   RA Area:     14.50 cm LA Vol (A2C):   44.7 ml 28.27 ml/m  RA Volume:   36.00 ml  22.77 ml/m LA Vol (A4C):    69.0 ml 43.64 ml/m LA Biplane Vol: 56.0 ml 35.42 ml/m  AORTIC VALVE AV Area (Vmax):    1.41 cm AV Area (Vmean):   1.25 cm AV Area (VTI):     1.38 cm AV Vmax:           341.00 cm/s AV Vmean:          280.000 cm/s AV VTI:            0.614 m AV Peak Grad:      46.5 mmHg AV Mean Grad:      33.0 mmHg LVOT Vmax:         168.00 cm/s LVOT Vmean:        122.000 cm/s LVOT  VTI:          0.296 m LVOT/AV VTI ratio: 0.48  AORTA Ao Root diam: 3.20 cm Ao Asc diam:  3.70 cm MITRAL VALVE                TRICUSPID VALVE MV Area (PHT): 4.24 cm     TR Peak grad:   11.0 mmHg MV Peak grad:  17.1 mmHg    TR Vmax:        166.00 cm/s MV Mean grad:  7.0 mmHg MV Vmax:       2.07 m/s     SHUNTS MV Vmean:      129.0 cm/s   Systemic VTI:  0.30 m MV Decel Time: 179 msec     Systemic Diam: 1.91 cm MV E velocity: 93.40 cm/s MV A velocity: 179.00 cm/s MV E/A ratio:  0.52 Skeet Latch MD Electronically signed by Skeet Latch MD Signature Date/Time: 07/22/2021/10:57:57 AM    Final    US Abdomen Limited RUQ (LIVER/GB)  Result Date: 07/21/2021 CLINICAL DATA:  Elevated LFTs. EXAM: ULTRASOUND ABDOMEN LIMITED RIGHT UPPER QUADRANT COMPARISON:  CT dated 05/13/2019. FINDINGS: Gallbladder: No gallstones or wall thickening visualized. No sonographic Murphy sign noted by sonographer. Common bile duct: Diameter: 5 mm Liver: The liver demonstrates a coarsened echotexture with normal echogenicity. There is slight irregularity of the liver contour. Clinical correlation is recommended to evaluate for early changes of cirrhosis. Portal vein is patent on color Doppler imaging with normal direction of blood flow towards the liver. Other: None. IMPRESSION: Coarsened liver echotexture, otherwise unremarkable right upper quadrant ultrasound. Electronically Signed   By: Anner Crete M.D.   On: 07/21/2021 23:52    STS Risk Calculator: Procedure: AV Replacement: Risk of Mortality: 3.209% Renal Failure: 3.347% Permanent Stroke: 2.478% Prolonged  Ventilation: 10.012% DSW Infection: 0.071% Reoperation: 3.514% Morbidity or Mortality:15.283% Short Length of Stay: 24.645% Long Length of Stay: 7.788%  [facility] Cardiomyopathy Questionnaire  KCCQ-12 07/23/2021  1 a. Ability to shower/bathe Slightly limited  1 b. Ability to walk 1 block Slightly limited  1 c. Ability to hurry/jog Other, Did not do  2. Edema feet/ankles/legs Less than once a week  3. Limited by fatigue 1-2 times a week  4. Limited by dyspnea At least once a day  5. Sitting up / on 3+ pillows Less than once a week  6. Limited enjoyment of life Moderately limited  7. Rest of life w/ symptoms Mostly dissatisfied  8 a. Participation in hobbies Limited quite a bit  8 b. Participation in chores Moderately limited  8 c. Visiting family/friends Slightly limited    Assessment and Plan:   Tinsley Everman is a 86 y.o. female with symptoms of moderate symptomatic aortic stenosis with NYHA Class III symptoms. I have reviewed the patient's recent echocardiogram which is notable for normal LV systolic function and moderate aortic stenosis with peak gradient of 46.100mHg and mean transvalvular gradient of 354mg. The patient's dimensionless index is 0.29 and calculated aortic valve area is 1.38 cm.    I have reviewed the natural history of aortic stenosis with the patient. We have discussed the limitations of medical therapy and the poor prognosis associated with symptomatic aortic stenosis. We have reviewed potential treatment options, including palliative medical therapy, conventional surgical aortic valve replacement, and transcatheter aortic valve replacement. We discussed treatment options in the context of this patient's specific comorbid medical conditions.    The patient's predicted risk of mortality with conventional aortic valve replacement is  3.2% primarily based on recent PNA, and acute CHF with new circumferential pericardial and pleural effusions. Other significant comorbid  conditions include advanced age. TAVR seems like a reasonable treatment option at some point for this patient however given significant pericardial effusion, would likely benefit from pericardial drain placement for symptomatic relief. We can then discern discrepancies between moderate AS on echocardiogram and inability to cross AV on cath earlier today as a contributing factor for her persistent shortness of breath. If TAVR is pursed, she will require formal cardiac surgical consultation. We discussed typical evaluation which will include a gated cardiac CTA and a CTA of the chest/abdomen/pelvis to evaluate both her cardiac anatomy and peripheral vasculature.   For questions or updates, please contact [place] Please consult [email or web address] for contact info under    Signed, Kathyrn Drown, NP  07/23/2021 11:32 AM    ATTENDING ATTESTATION:  After conducting a review of all available clinical information with the care team, interviewing the patient, and performing a physical exam, I agree with the findings and plan described in this note.   The patient is an 86 year old female with moderate symptomatic aortic stenosis (Stage B), hypertension, history of Takotsubo cardiomyopathy with resolution on 2 separate occasions, peripheral vascular disease, mild obstructive coronary artery disease who presents with failure to thrive and acute on chronic diastolic heart failure.  She was treated for pneumonia recently.  She presented with lower extremity edema to her PCPs office and Lasix was initiated.  She was ultimately admitted to the hospital here.  Her echocardiogram which I reviewed demonstrates a calcified aortic valve with only moderate mean gradient.  Her cardiac catheterization demonstrated mild obstructive coronary artery disease.  Of note her right heart catheterization demonstrated no equalization of pressures consistent with tamponade as she has a moderate pericardial effusion.  Her filling  pressures were relatively normal.  I reviewed the natural history of aortic stenosis and the fact that moderate aortic stenosis is not an indication for aortic intervention at this time.  I did tell her that we are participating in the PROGRESS trial randomizing moderate aortic stenosis patients to TAVR and watchful waiting until traditional guideline criteria are met.  I did reach out to our research personnel here and she does qualify for trial inclusion by her echocardiographic parameters.  I discussed the prospect of participating in this trial with the patient and and her daughter.  They are interested in learning further about this.  She is still symptomatic with shortness of breath and fatigue despite only moderate aortic stenosis and relatively normal filling pressures on right heart catheterization.  This may be due to an underlying pulmonary process and the patient is on azithromycin per the primary team.  Additionally this may represent deconditioning as she has been hospitalized several times in the last few months.  Likely it is a combination of all of these etiologies.  I am hopeful that she will improve over the next few days and could be discharged with close follow-up (with our research personnel.)  If she is here on Tuesday we will have her meet with the research personnel then to discuss trial inclusion.  We will obtain a TAVR procotol CTA.     Of note, the patient did also develop rapid atrial fibrillation was placed on a diltiazem drip with conversion to normal sinus rhythm.  Lenna Sciara, MD Pager [phone]

## 2021-07-23 NOTE — Progress Notes (Signed)
OT Cancellation Note  Patient Details Name: Larine Fielding MRN: [id number] DOB: 08/30/1934   Cancelled Treatment:    Reason Eval/Treat Not Completed: (P) Pt at procedure or test/unavailable. Pt is in Cath Lab, will follow up for eval as schedule permits.   Alfonzo Beers, OTD, OTR/L Acute Rehab [phone]    Mayer Masker 07/23/2021, 9:37 AM

## 2021-07-23 NOTE — Progress Notes (Signed)
SITE AREA: right groin/femoral  SITE PRIOR TO REMOVAL:  LEVEL 0  PRESSURE APPLIED FOR: approximately 10 minutes for veinous, approximately 20 minutes for arterial, Veinous sheath removed 1st and arterial sheath removed 2nd  MANUAL: yes  PATIENT STATUS DURING PULL: stable/sleeping  POST PULL SITE:  LEVEL 0  POST PULL INSTRUCTIONS GIVEN: yes  POST PULL PULSES PRESENT: right pedal pulse at +2  DRESSING APPLIED: gauze with tegaderm  BEDREST BEGINS @ 1120  COMMENTS: S Feras Gardella, RN  removed veinous than arterial sheath, [place], RTR, took over midway through arterial sheath removal

## 2021-07-23 NOTE — Progress Notes (Signed)
[facility] PROGRESS NOTE    Progress Note  Dovey Fatzinger  KVQ:[id number] DOB: 08-21-1934 DOA: 07/21/2021 PCP: Kristopher Glee., MD     Brief Narrative:   Keiko Myricks is an 86 y.o. 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   Assessment/Plan:   Shortness of breath multifactorial due to pericardial effusion and moderate aortic stenosis: 2D echo was done that showed a mean aortic gradient on 2D echo 20 mmHg and a moderate pericardial effusion which is circumferential Cardiology has been consulted was started on IV Lasix, poorly recorded. Continue strict I's and O's and daily weights. Continue metoprolol. For left and right heart cath today 07/24/2019  Acute on chronic diastolic heart failure:  She was given a single dose of IV Lasix, I's and O's poorly recorded. Lasix on hold, further management per cardiology. Continue metoprolol.  Essential hypertension Continue lisinopril.  Blood pressure is well controlled,  Hypervolemic hyponatremia: Sodium is improving slowly.  Bronchiectasis with acute exacerbation: With a history of MAC continue azithromycin.  Transaminitis/cirrhosis: Right upper quadrant ultrasound revealed early cirrhosis.  Etiology unclear, hepatitis A was positive statins were held. Discussed with GI they recommended follow-up as an outpatient.  Nonobstructive CAD: Elevated tropes, 50% stenosis of RCA in 2020.  Currently chest pain-free. Continue  aspirin    DVT prophylaxis: lovenox Family  Communication:none Status is: Inpatient  Remains inpatient appropriate because: Shortness of breath        Code Status:     Code Status Orders  (From admission, onward)           Start     Ordered   07/22/21 0013  Do not attempt resuscitation (DNR)  Continuous       Question Answer Comment  In the event of cardiac or respiratory ARREST Do not call a code blue   In the event of cardiac or respiratory ARREST Do not perform Intubation, CPR, defibrillation or ACLS   In the event of cardiac or respiratory ARREST Use medication by any route, position, wound care, and other measures to relive pain and suffering. May use oxygen, suction and manual treatment of airway obstruction as needed for comfort.      07/22/21 0012           Code Status History     Date Active Date Inactive Code Status Order ID Comments User Context   07/21/2021 2247 07/22/2021 0012 DNR [id number]  Toy Baker, MD ED   05/13/2019 1817 05/15/2019 2036 Full Code [id number]  [place], Peter M, MD Inpatient   05/13/2019 1817 05/13/2019 1817 Full Code [id number]  [place], Peter M, MD Inpatient         IV Access:   Peripheral IV   Procedures and diagnostic studies:   DG Chest 2 View  Result Date: 07/21/2021 CLINICAL DATA:  Shortness of breath. EXAM: CHEST - 2 VIEW COMPARISON:  Chest radiograph dated 07/18/2021. FINDINGS: Cardiomegaly with vascular congestion. Probable small bilateral pleural effusions and bibasilar atelectasis or infiltrate. No pneumothorax. Atherosclerotic calcification of the aorta. No acute osseous pathology. IMPRESSION: Cardiomegaly with vascular congestion and small bilateral pleural effusions.  Electronically Signed   By: Anner Crete M.D.   On: 07/21/2021 19:28   ECHOCARDIOGRAM COMPLETE  Result Date: 07/22/2021    ECHOCARDIOGRAM REPORT   Patient Name:   JAYLEA PLOURDE Date of Exam: 07/22/2021 Medical Rec #:  [id number]    Height:       61.0 in Accession #:    [id number]    Weight:       131.6 lb Date of Birth:  Jan 28, 1935     BSA:          1.581 m Patient Age:    49 years     BP:           155/92 mmHg Patient Gender: F            HR:           102 bpm. Exam Location:  Inpatient Procedure: 2D Echo, Color Doppler and Cardiac Doppler STAT ECHO Indications:    I31.3 Pericardial effusion  History:        Patient has prior history of Echocardiogram examinations, most                 recent 09/23/2019. Risk Factors:Hypertension and Dyslipidemia.                 Remote history of Takotsubo x2.  Sonographer:    Raquel Sarna Senior RDCS Referring Phys: [place]  1. Left ventricular ejection fraction, by estimation, is 60 to 65%. The left ventricle has normal function. The left ventricle has no regional wall motion abnormalities. There is moderate concentric left ventricular hypertrophy. Left ventricular diastolic parameters are consistent with Grade I diastolic dysfunction (impaired relaxation). Elevated left ventricular end-diastolic pressure.  2. Right ventricular systolic function is normal. The right ventricular size is normal. There is normal pulmonary artery systolic pressure.  3. Left atrial size was severely dilated.  4. Pericardial effusion measuring 1.5 cm. Increased compared with 09/2019. . Moderate pericardial effusion. The pericardial effusion is circumferential. There is no evidence of cardiac tamponade.  5. The mitral valve is degenerative. Trivial mitral valve regurgitation. No evidence of mitral stenosis. Moderate mitral annular calcification.  6. AoV not well visualized. Appears calcified. Vmax 3.4 m/s, MG 33 mmHG, AVA 1.38 cm2, DI 0.29. Moderate aortic stenosis. Mean gradient has increased from 20 mmHg 09/2019 to 33 mmHg now. The aortic valve is tricuspid. There is moderate calcification of the aortic valve. There is moderate thickening of the aortic valve. Aortic valve regurgitation is mild. Moderate aortic valve stenosis. Aortic valve area, by VTI measures  1.38 cm. Aortic valve mean gradient measures 33.0 mmHg. Aortic valve Vmax measures  3.41 m/s.  7. Aortic dilatation noted. There is mild dilatation of the ascending aorta, measuring 37 mm.  8. The inferior vena cava is dilated in size with >50% respiratory variability, suggesting right atrial pressure of 8 mmHg. FINDINGS  Left Ventricle: Left ventricular ejection fraction, by estimation, is 60 to 65%. The left ventricle has normal function. The left ventricle has no regional wall motion abnormalities. The left ventricular internal cavity size was normal in size. There is  moderate concentric left ventricular hypertrophy. Left ventricular diastolic parameters are consistent with Grade I diastolic dysfunction (impaired relaxation). Elevated left ventricular end-diastolic pressure. Right Ventricle: The right ventricular size is normal. No increase in right ventricular wall thickness. Right ventricular systolic function is normal. There is normal pulmonary artery systolic pressure. The tricuspid regurgitant velocity is 1.66 m/s, and  with an assumed right atrial  pressure of 8 mmHg, the estimated right ventricular systolic pressure is 14.4 mmHg. Left Atrium: Left atrial size was severely dilated. Right Atrium: Right atrial size was normal in size. Pericardium: Pericardial effusion measuring 1.5 cm. Increased compared with 09/2019. A moderately sized pericardial effusion is present. The pericardial effusion is circumferential. There is no evidence of cardiac tamponade. Presence of epicardial fat layer. Thickening/calcification of pericardium present. Mitral Valve: The mitral valve is degenerative in appearance. Moderate mitral annular calcification. Trivial mitral valve regurgitation. No evidence of mitral valve stenosis. MV peak gradient, 17.1 mmHg. The mean mitral valve gradient is 7.0 mmHg. Tricuspid Valve: The tricuspid valve is grossly normal. Tricuspid valve regurgitation is trivial. No evidence of tricuspid  stenosis. Aortic Valve: AoV not well visualized. Appears calcified. Vmax 3.4 m/s, MG 33 mmHG, AVA 1.38 cm2, DI 0.29. Moderate aortic stenosis. Mean gradient has increased from 20 mmHg 09/2019 to 33 mmHg now. The aortic valve is tricuspid. There is moderate calcification of the aortic valve. There is moderate thickening of the aortic valve. Aortic valve regurgitation is mild. Moderate aortic stenosis is present. Aortic valve mean gradient measures 33.0 mmHg. Aortic valve peak gradient measures 46.5 mmHg. Aortic valve area, by VTI measures 1.38 cm. Pulmonic Valve: The pulmonic valve was grossly normal. Pulmonic valve regurgitation is trivial. No evidence of pulmonic stenosis. Aorta: Aortic dilatation noted. There is mild dilatation of the ascending aorta, measuring 37 mm. Venous: The inferior vena cava is dilated in size with greater than 50% respiratory variability, suggesting right atrial pressure of 8 mmHg. IAS/Shunts: The atrial septum is grossly normal.  LEFT VENTRICLE PLAX 2D LVIDd:         2.50 cm   Diastology LVIDs:         1.80 cm   LV e' medial:    5.66 cm/s LV PW:         1.30 cm   LV E/e' medial:  16.5 LV IVS:        1.60 cm   LV e' lateral:   3.48 cm/s LVOT diam:     1.91 cm   LV E/e' lateral: 26.8 LV SV:         85 LV SV Index:   54 LVOT Area:     2.87 cm  RIGHT VENTRICLE RV S prime:     13.30 cm/s TAPSE (M-mode): 2.3 cm LEFT ATRIUM             Index        RIGHT ATRIUM           Index LA diam:        2.90 cm 1.83 cm/m   RA Area:     14.50 cm LA Vol (A2C):   44.7 ml 28.27 ml/m  RA Volume:   36.00 ml  22.77 ml/m LA Vol (A4C):   69.0 ml 43.64 ml/m LA Biplane Vol: 56.0 ml 35.42 ml/m  AORTIC VALVE AV Area (Vmax):    1.41 cm AV Area (Vmean):   1.25 cm AV Area (VTI):     1.38 cm AV Vmax:           341.00 cm/s AV Vmean:          280.000 cm/s AV VTI:            0.614 m AV Peak Grad:      46.5 mmHg AV Mean Grad:      33.0 mmHg LVOT Vmax:         168.00 cm/s  LVOT Vmean:        122.000 cm/s LVOT VTI:           0.296 m LVOT/AV VTI ratio: 0.48  AORTA Ao Root diam: 3.20 cm Ao Asc diam:  3.70 cm MITRAL VALVE                TRICUSPID VALVE MV Area (PHT): 4.24 cm     TR Peak grad:   11.0 mmHg MV Peak grad:  17.1 mmHg    TR Vmax:        166.00 cm/s MV Mean grad:  7.0 mmHg MV Vmax:       2.07 m/s     SHUNTS MV Vmean:      129.0 cm/s   Systemic VTI:  0.30 m MV Decel Time: 179 msec     Systemic Diam: 1.91 cm MV E velocity: 93.40 cm/s MV A velocity: 179.00 cm/s MV E/A ratio:  0.52 Skeet Latch MD Electronically signed by Skeet Latch MD Signature Date/Time: 07/22/2021/10:57:57 AM    Final    US Abdomen Limited RUQ (LIVER/GB)  Result Date: 07/21/2021 CLINICAL DATA:  Elevated LFTs. EXAM: ULTRASOUND ABDOMEN LIMITED RIGHT UPPER QUADRANT COMPARISON:  CT dated 05/13/2019. FINDINGS: Gallbladder: No gallstones or wall thickening visualized. No sonographic Murphy sign noted by sonographer. Common bile duct: Diameter: 5 mm Liver: The liver demonstrates a coarsened echotexture with normal echogenicity. There is slight irregularity of the liver contour. Clinical correlation is recommended to evaluate for early changes of cirrhosis. Portal vein is patent on color Doppler imaging with normal direction of blood flow towards the liver. Other: None. IMPRESSION: Coarsened liver echotexture, otherwise unremarkable right upper quadrant ultrasound. Electronically Signed   By: Anner Crete M.D.   On: 07/21/2021 23:52     Medical Consultants:   None.   Subjective:    Bjorn Pippin shortness of breath is better.  Objective:    Vitals:   07/22/21 1930 07/22/21 2000 07/22/21 2115 07/23/21 0400  BP: (!) 144/92 130/67 (!) 159/82 118/82  Pulse: (!) 108 (!) 110 90   Resp: 20 (!) 23 (!) 22 20  Temp:   98.5 F (36.9 C) (!) 97.5 F (36.4 C)  TempSrc:   Oral Oral  SpO2: 98% 99% 99% 96%  Weight:   59.2 kg   Height:   5' (1.524 m)    SpO2: 96 %  No intake or output data in the 24 hours ending 07/23/21 0729 Filed  Weights   07/22/21 2115  Weight: 59.2 kg    Exam: General exam: In no acute distress. Respiratory system: Good air movement and clear to auscultation. Cardiovascular system: S1 & S2 heard, RRR.  Positive JVD Gastrointestinal system: Abdomen is nondistended, soft and nontender.  Extremities: No pedal edema. Skin: No rashes, lesions or ulcers Psychiatry: Judgement and insight appear normal. Mood & affect appropriate.    Data Reviewed:    Labs: Basic Metabolic Panel: Recent Labs  Lab 07/21/21 1948 07/22/21 0022 07/22/21 0421  NA 128*  --  131*  K 4.2  --  3.9  CL 93*  --  93*  CO2 27  --  28  GLUCOSE 112*  --  109*  BUN 20  --  21  CREATININE 0.93  --  1.09*  CALCIUM 8.9  --  9.1  MG 2.1  --  2.2  PHOS  --  3.0 3.4   GFR Estimated Creatinine Clearance: 30.9 mL/min (A) (by C-G formula based on SCr of 1.09 mg/dL (  H)). Liver Function Tests: Recent Labs  Lab 07/21/21 1948 07/22/21 0421  AST 106* 70*  ALT 121* 99*  ALKPHOS 205* 175*  BILITOT 0.8 0.9  PROT 7.1 6.6  ALBUMIN 3.5 3.2*   No results for input(s): LIPASE, AMYLASE in the last 168 hours. No results for input(s): AMMONIA in the last 168 hours. Coagulation profile Recent Labs  Lab 07/21/21 1948  INR 1.0   COVID-19 Labs  Recent Labs    07/21/21 1948  DDIMER 1.58*    Lab Results  Component Value Date   SARSCOV2NAA NEGATIVE 07/21/2021   [place] NEGATIVE 05/13/2019    CBC: Recent Labs  Lab 07/21/21 1948 07/22/21 0421  WBC 8.4 7.5  NEUTROABS 5.8 4.7  HGB 11.5* 11.3*  HCT 35.6* 33.6*  MCV 91.8 90.8  PLT 267 275   Cardiac Enzymes: Recent Labs  Lab 07/22/21 0022  CKTOTAL 49   BNP (last 3 results) No results for input(s): PROBNP in the last 8760 hours. CBG: No results for input(s): GLUCAP in the last 168 hours. D-Dimer: Recent Labs    07/21/21 1948  DDIMER 1.58*   Hgb A1c: No results for input(s): HGBA1C in the last 72 hours. Lipid Profile: No results for input(s): CHOL,  HDL, LDLCALC, TRIG, CHOLHDL, LDLDIRECT in the last 72 hours. Thyroid function studies: Recent Labs    07/22/21 0421  TSH 1.472   Anemia work up: No results for input(s): VITAMINB12, FOLATE, FERRITIN, TIBC, IRON, RETICCTPCT in the last 72 hours. Sepsis Labs: Recent Labs  Lab 07/21/21 1948 07/22/21 0022 07/22/21 0421 07/22/21 2156  WBC 8.4  --  7.5  --   LATICACIDVEN  --  0.9  --  0.7   Microbiology Recent Results (from the past 240 hour(s))  Resp Panel by RT-PCR (Flu A&B, Covid) Nasopharyngeal Swab     Status: None   Collection Time: 07/21/21  7:48 PM   Specimen: Nasopharyngeal Swab; Nasopharyngeal(NP) swabs in vial transport medium  Result Value Ref Range Status   SARS Coronavirus 2 by RT PCR NEGATIVE NEGATIVE Final    Comment: (NOTE) SARS-CoV-2 target nucleic acids are NOT DETECTED.  The SARS-CoV-2 RNA is generally detectable in upper respiratory specimens during the acute phase of infection. The lowest concentration of SARS-CoV-2 viral copies this assay can detect is 138 copies/mL. A negative result does not preclude SARS-Cov-2 infection and should not be used as the sole basis for treatment or other patient management decisions. A negative result may occur with  improper specimen collection/handling, submission of specimen other than nasopharyngeal swab, presence of viral mutation(s) within the areas targeted by this assay, and inadequate number of viral copies(<138 copies/mL). A negative result must be combined with clinical observations, patient history, and epidemiological information. The expected result is Negative.  Fact Sheet for Patients:  EntrepreneurPulse.com.au  Fact Sheet for Healthcare Providers:  IncredibleEmployment.be  This test is no t yet approved or cleared by the [place] FDA and  has been authorized for detection and/or diagnosis of SARS-CoV-2 by FDA under an Emergency Use Authorization (EUA). This EUA  will remain  in effect (meaning this test can be used) for the duration of the COVID-19 declaration under Section 564(b)(1) of the Act, 21 U.S.C.section 360bbb-3(b)(1), unless the authorization is terminated  or revoked sooner.       Influenza A by PCR NEGATIVE NEGATIVE Final   Influenza B by PCR NEGATIVE NEGATIVE Final    Comment: (NOTE) The Xpert Xpress SARS-CoV-2/FLU/RSV plus assay is intended as an aid in  the diagnosis of influenza from Nasopharyngeal swab specimens and should not be used as a sole basis for treatment. Nasal washings and aspirates are unacceptable for Xpert Xpress SARS-CoV-2/FLU/RSV testing.  Fact Sheet for Patients: EntrepreneurPulse.com.au  Fact Sheet for Healthcare Providers: IncredibleEmployment.be  This test is not yet approved or cleared by the [place] FDA and has been authorized for detection and/or diagnosis of SARS-CoV-2 by FDA under an Emergency Use Authorization (EUA). This EUA will remain in effect (meaning this test can be used) for the duration of the COVID-19 declaration under Section 564(b)(1) of the Act, 21 U.S.C. section 360bbb-3(b)(1), unless the authorization is terminated or revoked.  Performed at [facility], [place] [address], [place], [place] [address]      Medications:    aspirin EC  81 mg Oral Daily   azithromycin  250 mg Oral Daily   cholecalciferol  1,000 Units Oral Daily   docusate sodium  100 mg Oral Q breakfast   escitalopram  5 mg Oral Daily   lidocaine  1 patch Transdermal Q24H   lisinopril  25 mg Oral Daily   loratadine  10 mg Oral Daily   metoprolol tartrate  25 mg Oral BID   montelukast  10 mg Oral QPC supper   pantoprazole  40 mg Oral Daily   sodium chloride flush  3 mL Intravenous Q12H   sodium chloride flush  3 mL Intravenous Q12H   Continuous Infusions:  sodium chloride     sodium chloride     sodium chloride 1 mL/kg/hr (07/23/21 0535)       LOS: 2 days   Charlynne Cousins  [facility]  07/23/2021, 7:29 AM

## 2021-07-23 NOTE — Progress Notes (Signed)
°   07/23/21 1602  Assess: MEWS Score  BP 98/69  Pulse Rate (!) 108  ECG Heart Rate (!) 107  SpO2 97 %  Assess: MEWS Score  MEWS Temp 0  MEWS Systolic 1  MEWS Pulse 1  MEWS RR 0  MEWS LOC 0  MEWS Score 2  MEWS Score Color Yellow  Assess: if the MEWS score is Yellow or Red  Were vital signs taken at a resting state? Yes  Focused Assessment Change from prior assessment (see assessment flowsheet)  Early Detection of Sepsis Score *See Row Information* Low  MEWS guidelines implemented *See Row Information* Yes  Treat  MEWS Interventions Administered prn meds/treatments;Administered scheduled meds/treatments  Notify: Charge Nurse/RN  Name of Charge Nurse/RN Notified Christy RN  Date Charge Nurse/RN Notified 07/23/21  Time Charge Nurse/RN Notified 1600

## 2021-07-23 NOTE — Progress Notes (Signed)
PT Cancellation Note  Patient Details Name: Barbara Mcconnell MRN: [id number] DOB: Nov 27, 1934   Cancelled Treatment:    Reason Eval/Treat Not Completed: Medical issues which prohibited therapy. Pt in cath earlier, then on bedrest post cath, and now went into a-fib. Will try again tomorrow.    Angelina Ok [name] 07/23/2021, 4:30 PM [name] PT Acute Rehabilitation Services Pager [phone] Office [phone]

## 2021-07-23 NOTE — Plan of Care (Signed)
°  Problem: Education: Goal: Ability to demonstrate management of disease process will improve 07/23/2021 0108 by Patria Mane, RN Outcome: Progressing 07/23/2021 0107 by Patria Mane, RN Outcome: Progressing Goal: Ability to verbalize understanding of medication therapies will improve 07/23/2021 0108 by Patria Mane, RN Outcome: Progressing 07/23/2021 0107 by Patria Mane, RN Outcome: Progressing Goal: Individualized Educational Video(s) 07/23/2021 0108 by Patria Mane, RN Outcome: Progressing 07/23/2021 0107 by Patria Mane, RN Outcome: Progressing   Problem: Activity: Goal: Capacity to carry out activities will improve 07/23/2021 0108 by Patria Mane, RN Outcome: Progressing 07/23/2021 0107 by Patria Mane, RN Outcome: Progressing   Problem: Cardiac: Goal: Ability to achieve and maintain adequate cardiopulmonary perfusion will improve 07/23/2021 0108 by Patria Mane, RN Outcome: Progressing 07/23/2021 0107 by Patria Mane, RN Outcome: Progressing   Problem: Education: Goal: Knowledge of General Education information will improve Description: Including pain rating scale, medication(s)/side effects and non-pharmacologic comfort measures Outcome: Progressing   Problem: Health Behavior/Discharge Planning: Goal: Ability to manage health-related needs will improve Outcome: Progressing   Problem: Clinical Measurements: Goal: Ability to maintain clinical measurements within normal limits will improve Outcome: Progressing Goal: Will remain free from infection Outcome: Progressing Goal: Diagnostic test results will improve Outcome: Progressing Goal: Respiratory complications will improve Outcome: Progressing Goal: Cardiovascular complication will be avoided Outcome: Progressing   Problem: Activity: Goal: Risk for activity intolerance will decrease Outcome: Progressing   Problem:  Nutrition: Goal: Adequate nutrition will be maintained Outcome: Progressing   Problem: Coping: Goal: Level of anxiety will decrease Outcome: Progressing   Problem: Elimination: Goal: Will not experience complications related to bowel motility Outcome: Progressing Goal: Will not experience complications related to urinary retention Outcome: Progressing   Problem: Pain Managment: Goal: General experience of comfort will improve Outcome: Progressing   Problem: Safety: Goal: Ability to remain free from injury will improve Outcome: Progressing   Problem: Skin Integrity: Goal: Risk for impaired skin integrity will decrease Outcome: Progressing

## 2021-07-23 NOTE — Progress Notes (Signed)
PT Cancellation Note  Patient Details Name: Barbara Mcconnell MRN: [id number] DOB: September 18, 1934   Cancelled Treatment:    Reason Eval/Treat Not Completed: (P) Patient at procedure or test/unavailable Pt is in the Cath Lab. PT will follow back this afternoon for Eval as able.  Jermiyah Ricotta B. Beverely Risen PT, DPT Acute Rehabilitation Services Pager [phone] Office [phone]    [name] Fleet 07/23/2021, 9:31 AM

## 2021-07-23 NOTE — Interval H&P Note (Signed)
Cath Lab Visit (complete for each Cath Lab visit)  Clinical Evaluation Leading to the Procedure:   ACS: Yes.    Non-ACS:    Anginal Classification: CCS IV  Anti-ischemic medical therapy: Minimal Therapy (1 class of medications)  Non-Invasive Test Results: High-risk stress test findings: cardiac mortality >3%/year  Prior CABG: No previous CABG  Severe aortic stenosis    History and Physical Interval Note:  07/23/2021 8:58 AM  Hilton Cork  has presented today for surgery, with the diagnosis of as.  The various methods of treatment have been discussed with the patient and family. After consideration of risks, benefits and other options for treatment, the patient has consented to  Procedure(s): RIGHT/LEFT HEART CATH AND CORONARY ANGIOGRAPHY (N/A) as a surgical intervention.  The patient's history has been reviewed, patient examined, no change in status, stable for surgery.  I have reviewed the patient's chart and labs.  Questions were answered to the patient's satisfaction.     Lance Muss

## 2021-07-24 ENCOUNTER — Inpatient Hospital Stay (HOSPITAL_COMMUNITY): Payer: Medicare HMO

## 2021-07-24 DIAGNOSIS — I350 Nonrheumatic aortic (valve) stenosis: Secondary | ICD-10-CM | POA: Diagnosis not present

## 2021-07-24 DIAGNOSIS — I3139 Other pericardial effusion (noninflammatory): Secondary | ICD-10-CM | POA: Diagnosis not present

## 2021-07-24 LAB — BASIC METABOLIC PANEL
Anion gap: 9 (ref 5–15)
BUN: 22 mg/dL (ref 8–23)
CO2: 27 mmol/L (ref 22–32)
Calcium: 9.1 mg/dL (ref 8.9–10.3)
Chloride: 93 mmol/L — ABNORMAL LOW (ref 98–111)
Creatinine, Ser: 1 mg/dL (ref 0.44–1.00)
GFR, Estimated: 56 mL/min — ABNORMAL LOW (ref >60)
Glucose, Bld: 101 mg/dL — ABNORMAL HIGH (ref 70–99)
Potassium: 4.1 mmol/L (ref 3.5–5.1)
Sodium: 129 mmol/L — ABNORMAL LOW (ref 135–145)

## 2021-07-24 MED ORDER — DIPHENHYDRAMINE HCL 50 MG/ML IJ SOLN: 50.0000 mg | Freq: Once | INTRAMUSCULAR | Status: AC

## 2021-07-24 MED ORDER — IOHEXOL 350 MG/ML SOLN
100.0000 mL | Freq: Once | INTRAVENOUS | Status: AC | PRN
  Administered 2021-07-24: 100 mL via INTRAVENOUS

## 2021-07-24 MED ORDER — METHYLPREDNISOLONE SODIUM SUCC 125 MG IJ SOLR: 60.0000 mg | Freq: Once | INTRAMUSCULAR | Status: DC

## 2021-07-24 MED ORDER — PREDNISONE 5 MG/ML PO CONC: 60.0000 mg | Freq: Every day | ORAL | Status: DC

## 2021-07-24 MED ORDER — METHYLPREDNISOLONE SODIUM SUCC 125 MG IJ SOLR
60.0000 mg | Freq: Once | INTRAMUSCULAR | Status: AC
  Administered 2021-07-24: 60 mg via INTRAVENOUS
  Filled 2021-07-24: qty 2

## 2021-07-24 MED ORDER — DIPHENHYDRAMINE HCL 25 MG PO CAPS
50.0000 mg | ORAL_CAPSULE | Freq: Once | ORAL | Status: AC
  Administered 2021-07-24: 50 mg via ORAL
  Filled 2021-07-24: qty 2

## 2021-07-24 NOTE — Progress Notes (Signed)
Progress Note  Patient Name: Barbara Mcconnell Date of Encounter: 07/24/2021  [facility] Cardiologist: Shakoya Gilmore [place], MD    Subjective   No complaints confused about w/u for TAVR  I had long discussion with daughter Barbara Mcconnell regarding w/u  Inpatient Medications    Scheduled Meds:  aspirin EC  81 mg Oral Daily   azithromycin  250 mg Oral Daily   cholecalciferol  1,000 Units Oral Daily   diphenhydrAMINE  50 mg Oral Once   Or   diphenhydrAMINE  50 mg Intravenous Once   docusate sodium  100 mg Oral Q breakfast   escitalopram  5 mg Oral Daily   lidocaine  1 patch Transdermal Q24H   lisinopril  25 mg Oral Daily   loratadine  10 mg Oral Daily   methylPREDNISolone (SOLU-MEDROL) injection  60 mg Intravenous Once   metoprolol tartrate  25 mg Oral BID   montelukast  10 mg Oral QPC supper   pantoprazole  40 mg Oral Daily   sodium chloride flush  3 mL Intravenous Q12H   Continuous Infusions:  sodium chloride     diltiazem (CARDIZEM) infusion 5 mg/hr (07/23/21 1601)   PRN Meds: sodium chloride, acetaminophen, acetaminophen, albuterol, ondansetron (ZOFRAN) IV, sodium chloride flush   Vital Signs    Vitals:   07/23/21 1602 07/23/21 1935 07/24/21 0515 07/24/21 0755  BP: 98/69 98/68 138/76 134/63  Pulse: (!) 108  86 86  Resp:  18 18 17   Temp:  (!) 97.5 F (36.4 C) 97.8 F (36.6 C) 98 F (36.7 C)  TempSrc:  Oral Oral Oral  SpO2: 97% 97% 97% 98%  Weight:   58.3 kg   Height:        Intake/Output Summary (Last 24 hours) at 07/24/2021 0916 Last data filed at 07/24/2021 0400 Gross per 24 hour  Intake 59.88 ml  Output --  Net 59.88 ml   Last 3 Weights 07/24/2021 07/22/2021 06/30/2021  Weight (lbs) 128 lb 8 oz 130 lb 8 oz 131 lb 9.6 oz  Weight (kg) 58.287 kg 59.194 kg 59.693 kg      Telemetry     - Personally Reviewed  ECG     - Personally Reviewed  Physical Exam   Elderly female some confusion Significant AS murmur  Lungs clear Abdomen benign  No edema Palpable  DP's   Labs    High Sensitivity Troponin:   Recent Labs  Lab 06/30/21 1532 07/21/21 1948 07/21/21 2152 07/22/21 0022 07/22/21 0421  TROPONINIHS 13 63* 56* 51* 48*     Chemistry Recent Labs  Lab 07/21/21 1948 07/22/21 0421 07/23/21 0745 07/23/21 0937 07/23/21 0942 07/23/21 0943 07/24/21 0119  NA 128* 131* 129*   < > 128* 128* 129*  K 4.2 3.9 4.1   < > 3.9 3.8 4.1  CL 93* 93* 93*  --   --   --  93*  CO2 27 28 25   --   --   --  27  GLUCOSE 112* 109* 106*  --   --   --  101*  BUN 20 21 18   --   --   --  22  CREATININE 0.93 1.09* 0.95  --   --   --  1.00  CALCIUM 8.9 9.1 8.9  --   --   --  9.1  MG 2.1 2.2  --   --   --   --   --   PROT 7.1 6.6  --   --   --   --   --  ALBUMIN 3.5 3.2*  --   --   --   --   --   AST 106* 70*  --   --   --   --   --   ALT 121* 99*  --   --   --   --   --   ALKPHOS 205* 175*  --   --   --   --   --   BILITOT 0.8 0.9  --   --   --   --   --   GFRNONAA >60 50* 59*  --   --   --  56*  ANIONGAP 8 10 11   --   --   --  9   < > = values in this interval not displayed.    Lipids No results for input(s): CHOL, TRIG, HDL, LABVLDL, LDLCALC, CHOLHDL in the last 168 hours.  Hematology Recent Labs  Lab 07/21/21 1948 07/22/21 0421 07/23/21 [id number] 07/23/21 0942 07/23/21 0943  WBC 8.4 7.5  --   --   --   RBC 3.88 3.70*  --   --   --   HGB 11.5* 11.3* 10.5* 10.9* 10.5*  HCT 35.6* 33.6* 31.0* 32.0* 31.0*  MCV 91.8 90.8  --   --   --   MCH 29.6 30.5  --   --   --   MCHC 32.3 33.6  --   --   --   RDW 13.4 13.4  --   --   --   PLT 267 275  --   --   --    Thyroid  Recent Labs  Lab 07/22/21 0421  TSH 1.472    BNP Recent Labs  Lab 07/21/21 1948  BNP 201.8*    DDimer  Recent Labs  Lab 07/21/21 1948  DDIMER 1.58*     Radiology    CARDIAC CATHETERIZATION  Result Date: 07/23/2021   Prox RCA lesion is 50% stenosed.   Prox Cx to Mid Cx lesion is 25% stenosed.   Prox LAD to Mid LAD lesion is 25% stenosed.   Hemodynamic findings consistent  with pulmonary hypertension. Stable, mild to moderate coronary artery disease.  No indication for PCI. Unable to cross aortic valve, consistent with worsening noted on echocardiogram.  Continue plans for TAVR w/u.    Cardiac Studies     Patient Profile     86 y.o. female    [place]     1.  AS :  moderate to severe by echo but morphologically severely calcified and limited motion. Unable to be crossed in lab I don't think she needs to be randomized to clinical trial to be done. Apparently Dr Hillery Hunter had spoken to family at length. He did not order CT scans and she has not been seen by surgeon. She has a mild/moderate pericardial effusion that may need to be drained during any procedure as well. I have ordered CT scans to size and can read tomorrow hopefully. She has allergy listed to prednisone according to daughter GI upset and fatigue during iv contrast for 2 CT abdomen done this past month IV team to start 18 g and solumedrol given Spoke with CT tech Elmyra Ricks who knows to do retrospective TAVR scan    2. CAD :  mild , insignificant at cath    For questions or updates, please contact [place] Please consult [email or web address] for contact info under        Signed, Jenkins Rouge, MD  07/24/2021, 9:16 AM

## 2021-07-24 NOTE — Progress Notes (Signed)
New dinner tray ordered per daughter request.  Tray was cold upon arrival.  Tray should be delivered by 2030 per dietary.

## 2021-07-24 NOTE — Social Work (Signed)
CSW acknowledges consult for SNF. The patient will require PT/OT evaluations. CSW will assist with disposition planning once the evaluations have been completed.    CSW will continue to follow.  

## 2021-07-24 NOTE — Progress Notes (Signed)
[facility] PROGRESS NOTE    Progress Note  Barbara Mcconnell  AXK:[id number] DOB: 25-Jan-1935 DOA: 07/21/2021 PCP: Kristopher Glee., MD     Brief Narrative:   Barbara Mcconnell is an 86 y.o. female past medical history significant for MAC, essential hypertension, hyperlipidemia, Takotsubo cardiomyopathy, recently seen by pulmonary as an outpatient started on azithromycin for MAC infection sent to the ED by PCP ED for shortness of breath CT angio of the chest was done on Christmas Day showed no evidence of PE but it did show scattered tree-in-bud nodular density in lungs bedside echo showed worsening pericardial effusion cardiology was consulted recommended transfer to [facility]. 2D echo was done that showed a mean aortic gradient on 2D echo 20 mmHg and a moderate pericardial effusion which is circumferential.  Cardiac cath showed proximal RCA 50% stenosis stable mild to moderate coronary artery disease no indication for PCI   Assessment/Plan:   Shortness of breath multifactorial due to pericardial effusion and moderate to severe aortic stenosis: Lasix was held, she is currently on lisinopril and metoprolol. cardiology recommended a CT scan to size her valve, she has an allergy to contrast that she was started on steroids. Awaiting cardiology further recommendations.  Acute on chronic diastolic heart failure:  Lasix on hold, further management per cardiology. Currently on metoprolol, diltiazem drip and lisinopril. Her blood pressure is well controlled.  I's and O's are poorly recorded.  Essential hypertension Blood pressure is well, see above for further details.  Hypervolemic hyponatremia: Sodium is improving slowly.  Bronchiectasis with acute exacerbation: With a history of MAC continue azithromycin.  Transaminitis/cirrhosis: Right upper quadrant ultrasound revealed early cirrhosis.  Etiology unclear, hepatitis A was positive statins were held. Discussed with GI they  recommended follow-up as an outpatient.  Nonobstructive CAD: Elevated tropes, 50% stenosis of RCA in 2020.  Currently chest pain-free. Continue  aspirin    DVT prophylaxis: lovenox Family Communication:none Status is: Inpatient  Remains inpatient appropriate because: Shortness of breath        Code Status:     Code Status Orders  (From admission, onward)           Start     Ordered   07/22/21 0013  Do not attempt resuscitation (DNR)  Continuous       Question Answer Comment  In the event of cardiac or respiratory ARREST Do not call a code blue   In the event of cardiac or respiratory ARREST Do not perform Intubation, CPR, defibrillation or ACLS   In the event of cardiac or respiratory ARREST Use medication by any route, position, wound care, and other measures to relive pain and suffering. May use oxygen, suction and manual treatment of airway obstruction as needed for comfort.      07/22/21 0012           Code Status History     Date Active Date Inactive Code Status Order ID Comments User Context   07/21/2021 2247 07/22/2021 0012 DNR [id number]  Toy Baker, MD ED   05/13/2019 1817 05/15/2019 2036 Full Code [id number]  [place], Peter M, MD Inpatient   05/13/2019 1817 05/13/2019 1817 Full Code [id number]  [place], Peter M, MD Inpatient         IV Access:   Peripheral IV   Procedures and diagnostic studies:   CARDIAC CATHETERIZATION  Result Date: 07/23/2021   Prox RCA lesion is 50% stenosed.   Prox Cx to Mid Cx lesion is 25% stenosed.   Prox LAD  to Mid LAD lesion is 25% stenosed.   Hemodynamic findings consistent with pulmonary hypertension. Stable, mild to moderate coronary artery disease.  No indication for PCI. Unable to cross aortic valve, consistent with worsening noted on echocardiogram.  Continue plans for TAVR w/u.     Medical Consultants:   None.   Subjective:    Barbara StewartSOb unchanged  Objective:    Vitals:   07/23/21  1602 07/23/21 1935 07/24/21 0515 07/24/21 0755  BP: 98/69 98/68 138/76 134/63  Pulse: (!) 108  86 86  Resp:  _0 Temp:  (!) 97.5 F (36.4 C) 97.8 F (36.6 C) 98 F (36.7 C)  TempSrc:  Oral Oral Oral  SpO2: 97% 97% 97% 98%  Weight:   58.3 kg   Height:       SpO2: 98 % O2 Flow Rate (L/min): 2 L/min   Intake/Output Summary (Last 24 hours) at 07/24/2021 1013 Last data filed at 07/24/2021 0400 Gross per 24 hour  Intake 59.88 ml  Output --  Net 59.88 ml   Filed Weights   07/22/21 2115 07/24/21 0515  Weight: 59.2 kg 58.3 kg    Exam: General exam: In no acute distress. Respiratory system: Good air movement and clear to auscultation. Cardiovascular system: S1 & S2 heard, RRR. No JVD. Gastrointestinal system: Abdomen is nondistended, soft and nontender.  Extremities: No pedal edema. Skin: No rashes, lesions or ulcers Psychiatry: Judgement and insight appear normal. Mood & affect appropriate.   Data Reviewed:    Labs: Basic Metabolic Panel: Recent Labs  Lab 07/21/21 1948 07/22/21 0022 07/22/21 0421 07/23/21 0745 07/23/21 1751 07/23/21 0258 07/23/21 0943 07/24/21 0119  NA 128*  --  131* 129* 128* 128* 128* 129*  K 4.2  --  3.9 4.1 3.8 3.9 3.8 4.1  CL 93*  --  93* 93*  --   --   --  93*  CO2 27  --  28 25  --   --   --  27  GLUCOSE 112*  --  109* 106*  --   --   --  101*  BUN 20  --  21 18  --   --   --  22  CREATININE 0.93  --  1.09* 0.95  --   --   --  1.00  CALCIUM 8.9  --  9.1 8.9  --   --   --  9.1  MG 2.1  --  2.2  --   --   --   --   --   PHOS  --  3.0 3.4  --   --   --   --   --     GFR Estimated Creatinine Clearance: 33.5 mL/min (by C-G formula based on SCr of 1 mg/dL). Liver Function Tests: Recent Labs  Lab 07/21/21 1948 07/22/21 0421  AST 106* 70*  ALT 121* 99*  ALKPHOS 205* 175*  BILITOT 0.8 0.9  PROT 7.1 6.6  ALBUMIN 3.5 3.2*    No results for input(s): LIPASE, AMYLASE in the last 168 hours. No results for input(s): AMMONIA in  the last 168 hours. Coagulation profile Recent Labs  Lab 07/21/21 1948  INR 1.0    COVID-19 Labs  Recent Labs    07/21/21 1948  DDIMER 1.58*     Lab Results  Component Value Date   SARSCOV2NAA NEGATIVE 07/21/2021   [place] NEGATIVE 05/13/2019    CBC: Recent Labs  Lab 07/21/21 1948 07/22/21 0421 07/23/21 5277  07/23/21 0942 07/23/21 0943  WBC 8.4 7.5  --   --   --   NEUTROABS 5.8 4.7  --   --   --   HGB 11.5* 11.3* 10.5* 10.9* 10.5*  HCT 35.6* 33.6* 31.0* 32.0* 31.0*  MCV 91.8 90.8  --   --   --   PLT 267 275  --   --   --     Cardiac Enzymes: Recent Labs  Lab 07/22/21 0022  CKTOTAL 49    BNP (last 3 results) No results for input(s): PROBNP in the last 8760 hours. CBG: No results for input(s): GLUCAP in the last 168 hours. D-Dimer: Recent Labs    07/21/21 1948  DDIMER 1.58*    Hgb A1c: No results for input(s): HGBA1C in the last 72 hours. Lipid Profile: No results for input(s): CHOL, HDL, LDLCALC, TRIG, CHOLHDL, LDLDIRECT in the last 72 hours. Thyroid function studies: Recent Labs    07/22/21 0421  TSH 1.472    Anemia work up: No results for input(s): VITAMINB12, FOLATE, FERRITIN, TIBC, IRON, RETICCTPCT in the last 72 hours. Sepsis Labs: Recent Labs  Lab 07/21/21 1948 07/22/21 0022 07/22/21 0421 07/22/21 2156  WBC 8.4  --  7.5  --   LATICACIDVEN  --  0.9  --  0.7    Microbiology Recent Results (from the past 240 hour(s))  Resp Panel by RT-PCR (Flu A&B, Covid) Nasopharyngeal Swab     Status: None   Collection Time: 07/21/21  7:48 PM   Specimen: Nasopharyngeal Swab; Nasopharyngeal(NP) swabs in vial transport medium  Result Value Ref Range Status   SARS Coronavirus 2 by RT PCR NEGATIVE NEGATIVE Final    Comment: (NOTE) SARS-CoV-2 target nucleic acids are NOT DETECTED.  The SARS-CoV-2 RNA is generally detectable in upper respiratory specimens during the acute phase of infection. The lowest concentration of SARS-CoV-2 viral  copies this assay can detect is 138 copies/mL. A negative result does not preclude SARS-Cov-2 infection and should not be used as the sole basis for treatment or other patient management decisions. A negative result may occur with  improper specimen collection/handling, submission of specimen other than nasopharyngeal swab, presence of viral mutation(s) within the areas targeted by this assay, and inadequate number of viral copies(<138 copies/mL). A negative result must be combined with clinical observations, patient history, and epidemiological information. The expected result is Negative.  Fact Sheet for Patients:  EntrepreneurPulse.com.au  Fact Sheet for Healthcare Providers:  IncredibleEmployment.be  This test is no t yet approved or cleared by the [place] FDA and  has been authorized for detection and/or diagnosis of SARS-CoV-2 by FDA under an Emergency Use Authorization (EUA). This EUA will remain  in effect (meaning this test can be used) for the duration of the COVID-19 declaration under Section 564(b)(1) of the Act, 21 U.S.C.section 360bbb-3(b)(1), unless the authorization is terminated  or revoked sooner.       Influenza A by PCR NEGATIVE NEGATIVE Final   Influenza B by PCR NEGATIVE NEGATIVE Final    Comment: (NOTE) The Xpert Xpress SARS-CoV-2/FLU/RSV plus assay is intended as an aid in the diagnosis of influenza from Nasopharyngeal swab specimens and should not be used as a sole basis for treatment. Nasal washings and aspirates are unacceptable for Xpert Xpress SARS-CoV-2/FLU/RSV testing.  Fact Sheet for Patients: EntrepreneurPulse.com.au  Fact Sheet for Healthcare Providers: IncredibleEmployment.be  This test is not yet approved or cleared by the [place] FDA and has been authorized for detection and/or diagnosis of  SARS-CoV-2 by FDA under an Emergency Use Authorization (EUA). This  EUA will remain in effect (meaning this test can be used) for the duration of the COVID-19 declaration under Section 564(b)(1) of the Act, 21 U.S.C. section 360bbb-3(b)(1), unless the authorization is terminated or revoked.  Performed at [facility], [place] [address], [place],  [place] [address]      Medications:    aspirin EC  81 mg Oral Daily   azithromycin  250 mg Oral Daily   cholecalciferol  1,000 Units Oral Daily   diphenhydrAMINE  50 mg Oral Once   Or   diphenhydrAMINE  50 mg Intravenous Once   docusate sodium  100 mg Oral Q breakfast   escitalopram  5 mg Oral Daily   lidocaine  1 patch Transdermal Q24H   lisinopril  25 mg Oral Daily   loratadine  10 mg Oral Daily   methylPREDNISolone (SOLU-MEDROL) injection  60 mg Intravenous Once   methylPREDNISolone (SOLU-MEDROL) injection  60 mg Intravenous Once   metoprolol tartrate  25 mg Oral BID   montelukast  10 mg Oral QPC supper   pantoprazole  40 mg Oral Daily   sodium chloride flush  3 mL Intravenous Q12H   Continuous Infusions:  sodium chloride     diltiazem (CARDIZEM) infusion 5 mg/hr (07/23/21 1601)      LOS: 3 days   Charlynne Cousins  [facility]  07/24/2021, 10:13 AM

## 2021-07-24 NOTE — Evaluation (Signed)
Physical Therapy Evaluation Patient Details Name: Barbara Mcconnell MRN: [id number] DOB: 21-May-1935 Today's Date: 07/24/2021  History of Present Illness  86 y.o. female sent to the ED by PCP on 12/28 to ED for shortness of breath. 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  Clinical Impression  Pt was seen for initiation of mobility, and for work on her safety and tolerance with vitals for gait.  Follow along with her to get her safely able to walk with vitals controlled, and to progress her to independence on RW or to no AD, depending on her safety and ability.  Follow as well with family to assist her and to have potentially a caregiver to avoid stressing her system.  Pt discusses pushing herself and does not monitor her response to the effort of keeping her house.  Recommend home health PT with regular help at home, or will have to change her plan to SNF care for safety and monitoring of her ability to care for herself. Focus on balance, LE strength and vitals with gait on room air.     Recommendations for follow up therapy are one component of a multi-disciplinary discharge planning process, led by the attending physician.  Recommendations may be updated based on patient status, additional functional criteria and insurance authorization.  Follow Up Recommendations Home health PT    Assistance Recommended at Discharge Intermittent Supervision/Assistance  Functional Status Assessment Patient has had a recent decline in their functional status and demonstrates the ability to make significant improvements in function in a reasonable and predictable amount of time.  Equipment Recommendations  None recommended by PT    Recommendations for Other Services        Precautions / Restrictions Precautions Precautions: Fall Precaution Comments: monitor HR and sats Restrictions Weight Bearing Restrictions: No      Mobility  Bed Mobility Overal bed mobility: Modified Independent             General bed mobility comments: I to get to chair at side of bed    Transfers Overall transfer level: Needs assistance Equipment used: 1 person hand held assist Transfers: Sit to/from Stand Sit to Stand: Min guard                Ambulation/Gait Ambulation/Gait assistance: Min guard Gait Distance (Feet): 20 Feet Assistive device: Rolling walker (2 wheels) Gait Pattern/deviations: Step-through pattern;Decreased stride length;Trunk flexed Gait velocity: reduced Gait velocity interpretation: <1.31 ft/sec, indicative of household ambulator Pre-gait activities: able to stand without help General Gait Details: pt experienced a drop in sats to 90% and HR up to 113.  Stairs            Wheelchair Mobility    Modified Rankin (Stroke Patients Only)       Balance Overall balance assessment: Needs assistance Sitting-balance support: Feet supported Sitting balance-Leahy Scale: Good     Standing balance support: Bilateral upper extremity supported Standing balance-Leahy Scale: Fair                               Pertinent Vitals/Pain Pain Assessment: No/denies pain    Home Living Family/patient expects to be discharged to:: Private residence Living Arrangements: Alone Available Help at Discharge: Family;Available PRN/intermittently Type of Home: House Home Access: Level entry  Home Layout: One level Home Equipment: Cane - single point Additional Comments: daughter works from home & in the office, can assist PRN    Prior Function Prior Level of Function : Independent/Modified Independent             Mobility Comments: no AD, exercises 3x/wk       Hand Dominance   Dominant Hand: Right     Extremity/Trunk Assessment   Upper Extremity Assessment Upper Extremity Assessment: Defer to OT evaluation    Lower Extremity Assessment Lower Extremity Assessment: Generalized weakness    Cervical / Trunk Assessment Cervical / Trunk Assessment: Normal  Communication   Communication: No difficulties  Cognition Arousal/Alertness: Awake/alert Behavior During Therapy: WFL for tasks assessed/performed;Anxious Overall Cognitive Status: Within Functional Limits for tasks assessed                                 General Comments: talked with pt about her need for control of her situation, after so many trips to ED        General Comments General comments (skin integrity, edema, etc.): on room air    Exercises     Assessment/Plan    PT Assessment Patient needs continued PT services  PT Problem List Decreased strength;Decreased balance;Decreased safety awareness       PT Treatment Interventions DME instruction;Gait training;Functional mobility training;Therapeutic activities;Therapeutic exercise;Balance training;Neuromuscular re-education;Patient/family education    PT Goals (Current goals can be found in the Care Plan section)  Acute Rehab PT Goals Patient Stated Goal: to go home alone and feel better PT Goal Formulation: With patient Time For Goal Achievement: 08/06/21 Potential to Achieve Goals: Good    Frequency Min 3X/week   Barriers to discharge Decreased caregiver support home alone with no family readily available to help    Co-evaluation               AM-PAC PT "6 Clicks" Mobility  Outcome Measure Help needed turning from your back to your side while in a flat bed without using bedrails?: None Help needed moving from lying on your back to sitting on the side of a flat bed without using bedrails?: None Help needed moving to and from a bed to a chair (including a wheelchair)?: A Little Help needed standing up from a chair using your arms  (e.g., wheelchair or bedside chair)?: A Little Help needed to walk in hospital room?: A Little Help needed climbing 3-5 steps with a railing? : A Lot 6 Click Score: 19    End of Session Equipment Utilized During Treatment: Gait belt Activity Tolerance: Patient tolerated treatment well Patient left: in chair;with call bell/phone within reach Nurse Communication: Mobility status PT Visit Diagnosis: Unsteadiness on feet (R26.81);Muscle weakness (generalized) (M62.81)    Time: 1791-5056 PT Time Calculation (min) (ACUTE ONLY): 29 min   Charges:   PT Evaluation $PT Eval Moderate Complexity: 1 Mod PT Treatments $Therapeutic Activity: 8-22 mins       Ramond Dial 07/24/2021, 5:25 PM  Mee Hives, PT PhD Acute Rehab Dept. Number: [place] [facility] [place]

## 2021-07-24 NOTE — Evaluation (Signed)
Occupational Therapy Evaluation Patient Details Name: Barbara Mcconnell MRN: [id number] DOB: [birth year] Today's Date: 07/24/2021   History of Present Illness Barbara Mcconnell is an 86 y.o. female sent to the ED by PCP ED for shortness of breath. 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   Clinical Impression   Barbara Mcconnell was indep PTA, no AD and reports that she exercises 3x/wk. She lives alone in a 1 level town home, level entry. Pt & pt's daughter are concerned with pt returning home, alone, with her SOB and associated symptoms. Pt is limited by general fatigue, impaired activity tolerance, and SOB. Overall she completed transfers and functional mobility with min guard for safety, and ADLs with up to min guard given increased time for all tasks. Pt will benefit from OT acutely to progress to indep baseline. Recommend HHOT at d/c.   Recommendations for follow up therapy are one component of a multi-disciplinary discharge planning process, led by the attending physician.  Recommendations may be updated based on patient status, additional functional criteria and insurance authorization.   Follow Up Recommendations  Home health OT    Assistance Recommended at Discharge Intermittent Supervision/Assistance     Equipment Recommendations  Tub/shower seat       Precautions / Restrictions Precautions Precautions: Fall Restrictions Weight Bearing Restrictions: No      Mobility Bed Mobility Overal bed mobility: Modified Independent             General bed mobility comments: pt sitting in chair upon arrival. Per RN, pt is transferring in/out of bed indep.    Transfers Overall transfer level: Needs assistance Equipment used: None Transfers: Sit to/from  Stand Sit to Stand: Min guard           General transfer comment: for safety only. increased time.      Balance Overall balance assessment: Needs assistance Sitting-balance support: Feet supported Sitting balance-Leahy Scale: Good     Standing balance support: No upper extremity supported;During functional activity Standing balance-Leahy Scale: Fair                             ADL either performed or assessed with clinical judgement   ADL Overall ADL's : Needs assistance/impaired Eating/Feeding: Independent;Sitting   Grooming: Min guard;Standing   Upper Body Bathing: Set up;Sitting   Lower Body Bathing: Min guard;Sit to/from stand   Upper Body Dressing : Set up;Sitting   Lower Body Dressing: Min guard;Sit to/from stand   Toilet Transfer: Min guard;Ambulation   Toileting- Clothing Manipulation and Hygiene: Supervision/safety;Sitting/lateral lean       Functional mobility during ADLs: Min guard General ADL Comments: incrased time for all tasks due to SOB and poor activity tolerance. pt with increased RR after ambulating the the BR and back. no AD.     Vision Baseline Vision/History: 0 No visual deficits Ability to See in Adequate Light: 0 Adequate Patient Visual Report: No change from baseline Vision Assessment?: No apparent visual deficits         Hand Dominance Right   Extremity/Trunk Assessment Upper Extremity Assessment Upper Extremity Assessment: Generalized weakness   Lower Extremity Assessment Lower Extremity Assessment: Defer to PT evaluation   Cervical / Trunk Assessment Cervical / Trunk Assessment: Normal   Communication Communication Communication: No difficulties   Cognition  Arousal/Alertness: Awake/alert Behavior During Therapy: WFL for tasks assessed/performed;Anxious Overall Cognitive Status: Within Functional Limits for tasks assessed             General Comments: pt and daughter are agitated with care, they have  had 3 ED visits in the last month. Pt reports being anxious about her SOB symptoms and wanting to find answers.     General Comments  VSS on RA, daughter present and supportive            Home Living Family/patient expects to be discharged to:: Private residence Living Arrangements: Alone Available Help at Discharge: Family;Available PRN/intermittently (daughter) Type of Home: House (townhome) Home Access: Level entry     Home Layout: One level     Bathroom Shower/Tub: Teacher, early years/pre: Handicapped height     Home Equipment: Radio producer - single point   Additional Comments: daughter works from home & in the office, can assist PRN      Prior Functioning/Environment Prior Level of Function : Independent/Modified Independent;Driving             Mobility Comments: no AD, exercises 3x/wk ADLs Comments: indep        OT Problem List: Decreased activity tolerance;Impaired balance (sitting and/or standing);Decreased knowledge of precautions;Cardiopulmonary status limiting activity      OT Treatment/Interventions: Self-care/ADL training;Therapeutic exercise;Therapeutic activities;Patient/family education;Balance training    OT Goals(Current goals can be found in the care plan section) Acute Rehab OT Goals Patient Stated Goal: back to indep OT Goal Formulation: With patient Time For Goal Achievement: 08/07/21 Potential to Achieve Goals: Good ADL Goals Pt/caregiver will Perform Home Exercise Program: Increased ROM;Increased strength;Both right and left upper extremity;With written HEP provided Additional ADL Goal #1: Pt will indep complete all BADLs Additional ADL Goal #2: pt will indep verbalize at least 3 energy conservation strategies to apply in the home setting  OT Frequency: Min 2X/week   Barriers to D/C: Decreased caregiver support  pt lives alone          AM-PAC OT "6 Clicks" Daily Activity     Outcome Measure Help from another person eating  meals?: None Help from another person taking care of personal grooming?: A Little Help from another person toileting, which includes using toliet, bedpan, or urinal?: A Little Help from another person bathing (including washing, rinsing, drying)?: A Little Help from another person to put on and taking off regular upper body clothing?: None Help from another person to put on and taking off regular lower body clothing?: A Little 6 Click Score: 20   End of Session Nurse Communication: Mobility status  Activity Tolerance: Patient tolerated treatment well Patient left: in chair;with call bell/phone within reach;with family/visitor present  OT Visit Diagnosis: Muscle weakness (generalized) (M62.81);Other abnormalities of gait and mobility (R26.89)                Time: 5027-7412 OT Time Calculation (min): 20 min Charges:  OT General Charges $OT Visit: 1 Visit OT Evaluation $OT Eval Moderate Complexity: 1 Mod  Peytyn Trine A Felicity Penix 07/24/2021, 11:39 AM

## 2021-07-25 DIAGNOSIS — I350 Nonrheumatic aortic (valve) stenosis: Secondary | ICD-10-CM | POA: Diagnosis not present

## 2021-07-25 DIAGNOSIS — I3139 Other pericardial effusion (noninflammatory): Secondary | ICD-10-CM | POA: Diagnosis not present

## 2021-07-25 NOTE — Progress Notes (Signed)
Progress Note  Patient Name: Barbara Mcconnell Date of Encounter: 07/25/2021  [facility] Cardiologist: Linsey Arteaga [place], MD    Subjective   Long discussion again with patient and daughter Barbara Mcconnell with Dr Hillery Hunter as well. She is adamant that she wants valve fixed ASAP and not wait or be enrolled in trial She has been hospitalized with CHF 3 times recently and wants definitive RX  Inpatient Medications    Scheduled Meds:  aspirin EC  81 mg Oral Daily   azithromycin  250 mg Oral Daily   cholecalciferol  1,000 Units Oral Daily   docusate sodium  100 mg Oral Q breakfast   escitalopram  5 mg Oral Daily   lidocaine  1 patch Transdermal Q24H   lisinopril  25 mg Oral Daily   loratadine  10 mg Oral Daily   metoprolol tartrate  25 mg Oral BID   montelukast  10 mg Oral QPC supper   pantoprazole  40 mg Oral Daily   sodium chloride flush  3 mL Intravenous Q12H   Continuous Infusions:  sodium chloride     diltiazem (CARDIZEM) infusion 5 mg/hr (07/24/21 1402)   PRN Meds: sodium chloride, acetaminophen, acetaminophen, albuterol, ondansetron (ZOFRAN) IV, sodium chloride flush   Vital Signs    Vitals:   07/24/21 1643 07/24/21 2015 07/25/21 0500 07/25/21 0805  BP: (!) 147/71 128/64  (!) 149/75  Pulse: 87     Resp: 18 18  18   Temp:      TempSrc: Oral     SpO2: 99% 98%  98%  Weight:   58.6 kg   Height:        Intake/Output Summary (Last 24 hours) at 07/25/2021 0901 Last data filed at 07/25/2021 [id number] Gross per 24 hour  Intake 368.07 ml  Output --  Net 368.07 ml   Last 3 Weights 07/25/2021 07/24/2021 07/22/2021  Weight (lbs) 129 lb 3.2 oz 128 lb 8 oz 130 lb 8 oz  Weight (kg) 58.605 kg 58.287 kg 59.194 kg      Telemetry     - Personally Reviewed  ECG     - Personally Reviewed  Physical Exam   Elderly female some confusion Significant AS murmur  Lungs clear Abdomen benign  No edema Palpable DP's   Labs    High Sensitivity Troponin:   Recent Labs  Lab 06/30/21 1532  07/21/21 1948 07/21/21 2152 07/22/21 0022 07/22/21 0421  TROPONINIHS 13 63* 56* 51* 48*     Chemistry Recent Labs  Lab 07/21/21 1948 07/22/21 0421 07/23/21 0745 07/23/21 0937 07/23/21 0942 07/23/21 0943 07/24/21 0119  NA 128* 131* 129*   < > 128* 128* 129*  K 4.2 3.9 4.1   < > 3.9 3.8 4.1  CL 93* 93* 93*  --   --   --  93*  CO2 27 28 25   --   --   --  27  GLUCOSE 112* 109* 106*  --   --   --  101*  BUN 20 21 18   --   --   --  22  CREATININE 0.93 1.09* 0.95  --   --   --  1.00  CALCIUM 8.9 9.1 8.9  --   --   --  9.1  MG 2.1 2.2  --   --   --   --   --   PROT 7.1 6.6  --   --   --   --   --   ALBUMIN 3.5  3.2*  --   --   --   --   --   AST 106* 70*  --   --   --   --   --   ALT 121* 99*  --   --   --   --   --   ALKPHOS 205* 175*  --   --   --   --   --   BILITOT 0.8 0.9  --   --   --   --   --   GFRNONAA >60 50* 59*  --   --   --  56*  ANIONGAP 8 10 11   --   --   --  9   < > = values in this interval not displayed.    Lipids No results for input(s): CHOL, TRIG, HDL, LABVLDL, LDLCALC, CHOLHDL in the last 168 hours.  Hematology Recent Labs  Lab 07/21/21 1948 07/22/21 0421 07/23/21 [id number] 07/23/21 0942 07/23/21 0943  WBC 8.4 7.5  --   --   --   RBC 3.88 3.70*  --   --   --   HGB 11.5* 11.3* 10.5* 10.9* 10.5*  HCT 35.6* 33.6* 31.0* 32.0* 31.0*  MCV 91.8 90.8  --   --   --   MCH 29.6 30.5  --   --   --   MCHC 32.3 33.6  --   --   --   RDW 13.4 13.4  --   --   --   PLT 267 275  --   --   --    Thyroid  Recent Labs  Lab 07/22/21 0421  TSH 1.472    BNP Recent Labs  Lab 07/21/21 1948  BNP 201.8*    DDimer  Recent Labs  Lab 07/21/21 1948  DDIMER 1.58*     Radiology    CT CHEST W CONTRAST  Result Date: 07/24/2021 CLINICAL DATA:  Thoracic aorta disease, post-op; Aneurysm, pelvis or lower extremity. Aneurysm, pelvis or lower extremity. Severe symptomatic aortic stenosis. EXAM: CT ANGIOGRAPHY CHEST, ABDOMEN AND PELVIS TECHNIQUE: Multidetector CT imaging  through the chest, abdomen and pelvis was performed using the standard protocol during bolus administration of intravenous contrast. Multiplanar reconstructed images and MIPs were obtained and reviewed to evaluate the vascular anatomy. CONTRAST:  160mL OMNIPAQUE IOHEXOL 350 MG/ML SOLN COMPARISON:  07/18/2021 FINDINGS: CTA CHEST FINDINGS Cardiovascular: The thoracic aorta is of normal caliber, measuring 3.6 x 3.7 cm in its ascending segment, 2.9 x 2.6 cm within the arch just beyond the takeoff of the left subclavian artery, and 2.6 x 2.2 cm within its descending segment at the level of the left atrium. No intramural hematoma or dissection. Moderate calcified atherosclerotic plaque. Conventional arch vessel anatomic configuration. Less than 50% stenosis of the left subclavian artery at its origin. Extensive, irregular mixed atherosclerotic plaque at the right carotid bifurcation results in a greater than 90% stenosis of the proximal right internal carotid artery calcified atherosclerotic plaque at the left carotid bifurcation results in a less than 50% stenosis the proximal left internal carotid artery. Calcified atherosclerotic plaque results in a 50% stenosis of the dominant left vertebral artery at its origin. Mild cardiomegaly with left ventricular hypertrophy noted. Extensive atherosclerotic calcification of the aortic valve leaflets. The aortic valve is trileaflet. Moderate coronary artery calcification with probable stenting of the proximal right coronary artery. Moderate pericardial effusion is present, enlarged in size when compared to prior examination. Pericardial fluid appears slightly hyperdense suggesting proteinaceous or  hemorrhagic fluid. No CT evidence of cardiac tamponade. The central pulmonary arteries are of normal caliber. Mediastinum/Nodes: No enlarged mediastinal, hilar, or axillary lymph nodes. Thyroid gland, trachea, and esophagus demonstrate no significant findings. Lungs/Pleura: Small  bilateral pleural effusions are present with compressive atelectasis of the a lower lobes bilaterally, left greater than right. There is cylindrical bronchiectasis and volume loss noted within the lingula and basilar right middle lobe. Mild peribronchial nodularity within these regions may reflect the sequela of remote inflammation or a chronic atypical infectious process as can be seen with mycobacterial or fungal infection. No confluent pulmonary infiltrate. No pneumothorax. No central obstructing lesion. Musculoskeletal: No acute bone abnormality. No lytic or blastic bone lesion. Review of the MIP images confirms the above findings. CTA ABDOMEN AND PELVIS FINDINGS VASCULAR Aorta: Normal caliber. No aneurysm or dissection. Moderate atherosclerotic calcification. No periaortic inflammatory change. Celiac: Less than 50% stenosis of the a celiac origin secondary to calcified atherosclerotic plaque. Distally widely patent. No aneurysm or dissection. SMA: Patent without evidence of aneurysm, dissection, vasculitis or significant stenosis. Renals: Single renal arteries bilaterally. Less than 50% stenosis of the origin of the right renal artery. Widely patent left renal artery. Both arteries demonstrate a a beaded appearance within there mid segments in keeping with changes of fibromuscular dysplasia. No aneurysm or dissection. IMA: 50-75% stenosis of the origin.  Distally widely patent. Inflow: Mild atherosclerotic calcification. No evidence of hemodynamically significant stenosis. No aneurysm or dissection. Common iliac arteries measure 8-9 mm in diameter bilaterally. External iliac arteries measure 6 mm in diameter bilaterally. Common femoral arteries measure 7 mm in diameter bilaterally. Veins: No obvious venous abnormality within the limitations of this arterial phase study. Review of the MIP images confirms the above findings. NON-VASCULAR Hepatobiliary: Geographic region of hyperenhancement within segment 2 of the  liver may relate to underlying arterial portal shunting with a resultant transient hepatic attenuation difference (THAD). Similar region is seen within segment 4B of the liver. There are at least 3 arterial enhancing rounded lesions within segment 4 and segment 8 of the liver measuring up to 8 mm (image # 137/4) which are nonspecific but may represent small flash filled hemangioma. The liver is otherwise unremarkable. Gallbladder unremarkable. No intra or extrahepatic biliary ductal dilation. Pancreas: Unremarkable Spleen: Unremarkable Adrenals/Urinary Tract: The adrenal glands are unremarkable. The kidneys are normal in position. Moderate left renal asymmetric cortical atrophy. No enhancing intrarenal masses. No hydronephrosis. No intrarenal or ureteral calculi. The bladder is unremarkable. Stomach/Bowel: Stomach is within normal limits. Appendix appears normal. No evidence of bowel wall thickening, distention, or inflammatory changes. Lymphatic: No pathologic adenopathy within the abdomen and pelvis. Reproductive: Uterus and bilateral adnexa are unremarkable. Other: Surgical dressing noted within the right inguinal region. No abdominal wall hernia. Musculoskeletal: Degenerative changes are noted within the lumbar spine with grade 1 anterolisthesis of L4-5. No acute bone abnormality. No lytic or blastic bone lesion. Review of the MIP images confirms the above findings. IMPRESSION: No evidence of thoracoabdominal aortic aneurysm or dissection. Peripheral vascular disease with greater than 90% stenosis of the proximal right internal carotid artery. Plaque morphology appears irregular and may be better assessed with dedicated carotid artery Doppler sonography. 50% stenosis of the a dominant left vertebral artery at its origin. 50-75% stenosis of the inferior mesenteric artery at its origin, though this is of questionable clinical significance given patency of the celiac axis and superior mesenteric artery. Morphologic  changes in keeping with fibromuscular dysplasia involving the renal arteries bilaterally. Cardiomegaly with  left ventricular hypertrophy. Extensive calcification of the aortic valve leaflets. Enlarging, moderate pericardial effusion without definite CT evidence of cardiac tamponade. Note, however, small bilateral pleural effusions have developed since prior examination and can be seen the setting of progressive cardiogenic failure. Pericardial fluid appears slightly hyperdense suggesting a proteinaceous or hemorrhagic component. Cylindrical bronchiectasis and volume loss within the lingula and basilar right middle lobe with associated peribronchial nodularity. This may reflect the sequela of remote inflammation or reflect changes of a chronic infectious process such as atypical mycobacterial or fungal pneumonia. Multiple subcentimeter arterially enhancing lesions within the liver possibly representing small flash filled hemangioma. These are not optimally characterized on this examination may be better assessed with dedicated MRI examination if clinically indicated. Aortic Atherosclerosis (ICD10-I70.0). Electronically Signed   By: Fidela Salisbury M.D.   On: 07/24/2021 21:16   CT ANGIO ABDOMEN W &/OR WO CONTRAST  Result Date: 07/24/2021 CLINICAL DATA:  Thoracic aorta disease, post-op; Aneurysm, pelvis or lower extremity. Aneurysm, pelvis or lower extremity. Severe symptomatic aortic stenosis. EXAM: CT ANGIOGRAPHY CHEST, ABDOMEN AND PELVIS TECHNIQUE: Multidetector CT imaging through the chest, abdomen and pelvis was performed using the standard protocol during bolus administration of intravenous contrast. Multiplanar reconstructed images and MIPs were obtained and reviewed to evaluate the vascular anatomy. CONTRAST:  163mL OMNIPAQUE IOHEXOL 350 MG/ML SOLN COMPARISON:  07/18/2021 FINDINGS: CTA CHEST FINDINGS Cardiovascular: The thoracic aorta is of normal caliber, measuring 3.6 x 3.7 cm in its ascending segment,  2.9 x 2.6 cm within the arch just beyond the takeoff of the left subclavian artery, and 2.6 x 2.2 cm within its descending segment at the level of the left atrium. No intramural hematoma or dissection. Moderate calcified atherosclerotic plaque. Conventional arch vessel anatomic configuration. Less than 50% stenosis of the left subclavian artery at its origin. Extensive, irregular mixed atherosclerotic plaque at the right carotid bifurcation results in a greater than 90% stenosis of the proximal right internal carotid artery calcified atherosclerotic plaque at the left carotid bifurcation results in a less than 50% stenosis the proximal left internal carotid artery. Calcified atherosclerotic plaque results in a 50% stenosis of the dominant left vertebral artery at its origin. Mild cardiomegaly with left ventricular hypertrophy noted. Extensive atherosclerotic calcification of the aortic valve leaflets. The aortic valve is trileaflet. Moderate coronary artery calcification with probable stenting of the proximal right coronary artery. Moderate pericardial effusion is present, enlarged in size when compared to prior examination. Pericardial fluid appears slightly hyperdense suggesting proteinaceous or hemorrhagic fluid. No CT evidence of cardiac tamponade. The central pulmonary arteries are of normal caliber. Mediastinum/Nodes: No enlarged mediastinal, hilar, or axillary lymph nodes. Thyroid gland, trachea, and esophagus demonstrate no significant findings. Lungs/Pleura: Small bilateral pleural effusions are present with compressive atelectasis of the a lower lobes bilaterally, left greater than right. There is cylindrical bronchiectasis and volume loss noted within the lingula and basilar right middle lobe. Mild peribronchial nodularity within these regions may reflect the sequela of remote inflammation or a chronic atypical infectious process as can be seen with mycobacterial or fungal infection. No confluent  pulmonary infiltrate. No pneumothorax. No central obstructing lesion. Musculoskeletal: No acute bone abnormality. No lytic or blastic bone lesion. Review of the MIP images confirms the above findings. CTA ABDOMEN AND PELVIS FINDINGS VASCULAR Aorta: Normal caliber. No aneurysm or dissection. Moderate atherosclerotic calcification. No periaortic inflammatory change. Celiac: Less than 50% stenosis of the a celiac origin secondary to calcified atherosclerotic plaque. Distally widely patent. No aneurysm or dissection. SMA: Patent  without evidence of aneurysm, dissection, vasculitis or significant stenosis. Renals: Single renal arteries bilaterally. Less than 50% stenosis of the origin of the right renal artery. Widely patent left renal artery. Both arteries demonstrate a a beaded appearance within there mid segments in keeping with changes of fibromuscular dysplasia. No aneurysm or dissection. IMA: 50-75% stenosis of the origin.  Distally widely patent. Inflow: Mild atherosclerotic calcification. No evidence of hemodynamically significant stenosis. No aneurysm or dissection. Common iliac arteries measure 8-9 mm in diameter bilaterally. External iliac arteries measure 6 mm in diameter bilaterally. Common femoral arteries measure 7 mm in diameter bilaterally. Veins: No obvious venous abnormality within the limitations of this arterial phase study. Review of the MIP images confirms the above findings. NON-VASCULAR Hepatobiliary: Geographic region of hyperenhancement within segment 2 of the liver may relate to underlying arterial portal shunting with a resultant transient hepatic attenuation difference (THAD). Similar region is seen within segment 4B of the liver. There are at least 3 arterial enhancing rounded lesions within segment 4 and segment 8 of the liver measuring up to 8 mm (image # 137/4) which are nonspecific but may represent small flash filled hemangioma. The liver is otherwise unremarkable. Gallbladder  unremarkable. No intra or extrahepatic biliary ductal dilation. Pancreas: Unremarkable Spleen: Unremarkable Adrenals/Urinary Tract: The adrenal glands are unremarkable. The kidneys are normal in position. Moderate left renal asymmetric cortical atrophy. No enhancing intrarenal masses. No hydronephrosis. No intrarenal or ureteral calculi. The bladder is unremarkable. Stomach/Bowel: Stomach is within normal limits. Appendix appears normal. No evidence of bowel wall thickening, distention, or inflammatory changes. Lymphatic: No pathologic adenopathy within the abdomen and pelvis. Reproductive: Uterus and bilateral adnexa are unremarkable. Other: Surgical dressing noted within the right inguinal region. No abdominal wall hernia. Musculoskeletal: Degenerative changes are noted within the lumbar spine with grade 1 anterolisthesis of L4-5. No acute bone abnormality. No lytic or blastic bone lesion. Review of the MIP images confirms the above findings. IMPRESSION: No evidence of thoracoabdominal aortic aneurysm or dissection. Peripheral vascular disease with greater than 90% stenosis of the proximal right internal carotid artery. Plaque morphology appears irregular and may be better assessed with dedicated carotid artery Doppler sonography. 50% stenosis of the a dominant left vertebral artery at its origin. 50-75% stenosis of the inferior mesenteric artery at its origin, though this is of questionable clinical significance given patency of the celiac axis and superior mesenteric artery. 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 Cardiomegaly with left ventricular hypertrophy. Extensive calcification of the aortic valve leaflets. Enlarging, moderate pericardial effusion without definite CT evidence of cardiac tamponade. Note, however, small bilateral pleural effusions have developed since prior examination and can be seen the setting of progressive cardiogenic  failure. Pericardial fluid appears slightly hyperdense suggesting a proteinaceous or hemorrhagic component. Cylindrical bronchiectasis and volume loss within the lingula and basilar right middle lobe with associated peribronchial nodularity. This may reflect the sequela of remote inflammation or reflect changes of a chronic infectious process such as atypical mycobacterial or fungal pneumonia. Multiple subcentimeter arterially enhancing lesions within the liver possibly representing small flash filled hemangioma. These are not optimally characterized on this examination may be better assessed with dedicated MRI examination if clinically indicated. Aortic Atherosclerosis (ICD10-I70.0). Electronically Signed   By: Fidela Salisbury M.D.   On: 07/24/2021 21:16   CARDIAC CATHETERIZATION  Result Date: 07/23/2021   Prox RCA lesion is 50% stenosed.   Prox Cx to Mid Cx lesion is 25% stenosed.   Prox  LAD to Mid LAD lesion is 25% stenosed.   Hemodynamic findings consistent with pulmonary hypertension. Stable, mild to moderate coronary artery disease.  No indication for PCI. Unable to cross aortic valve, consistent with worsening noted on echocardiogram.  Continue plans for TAVR w/u.   CT CORONARY MORPH W/CTA COR W/SCORE W/CA W/CM &/OR WO/CM  Result Date: 07/25/2021 CLINICAL DATA:  Aortic Stenosis EXAM: Cardiac TAVR CT TECHNIQUE: The patient was scanned on a Siemens Force [id number] slice scanner. A 120 kV retrospective scan was triggered in the ascending thoracic aorta at 140 HU's. Gantry rotation speed was 250 msecs and collimation was .6 mm. No beta blockade or nitro were given. The 3D data set was reconstructed in 5% intervals of the R-R cycle. Systolic and diastolic phases were analyzed on a dedicated work station using MPR, MIP and VRT modes. The patient received 80 cc of contrast. FINDINGS: Aortic Valve: Tri leaflet calcified score 2093 Aorta: Bovine arch no aneurysm moderate calcific atherosclerosis Sino-tubular  Junction: 25 nn Ascending Thoracic Aorta: 35 mm Aortic Arch: 27 mm Descending Thoracic Aorta: 27 mm Sinus of Valsalva Measurements: Non-coronary: 29.3 mm Right - coronary: 28.6 mm Left -   coronary: 28 mm Coronary Artery Height above Annulus: Left Main: 15 mm above annulus Right Coronary: 15.6 mm above annulus Virtual Basal Annulus Measurements: Average  diameter: 22.8 mm Perimeter: 74.9 mm Area: 407 mm2 AVA by planimetry 1.01 cm2 Coronary Arteries: Sufficient height above annulus for deployment Optimum Fluoroscopic Angle for Delivery: LAO 20 Caudal 19 degrees IMPRESSION: 1. Tri leaflet AV with calcium score 2093 and planimetered AVA 1.01 cm2 2.  Annular area of 407 mm2 suitable for a 23 mm Sapien 3 valve 3.  Coronary arteries sufficient height above annulus for deployment 4. Optimal angiographic angle for deployment LAO 20 Caudal 19 degrees 5.  Large circumferential pericardial effusion Jenkins Rouge Electronically Signed   By: Jenkins Rouge M.D.   On: 07/25/2021 06:57    Cardiac Studies     Patient Profile     87 y.o. female    [place]     1.  AS :  Despite gradients not being in severe range she has LVH with small LV cavity and likely low SV mean gradient is 33 mmHg Valvue morphologically severe with calcium score 2093 planimetered AVA 1 cm2 and CTA shows she is suitable for a 23 mm Sapien 3 valve. Will discuss with structural team but feel pericardial effusion should be drained Tuesday ( quite large on CT ) and then proceed with surgical consultation and TAVR next available slot     2. CAD :  mild , insignificant at cath    For questions or updates, please contact [place] Please consult [email or web address] for contact info under        Signed, Jenkins Rouge, MD  07/25/2021, 9:01 AM

## 2021-07-25 NOTE — Care Plan (Signed)
Reviewed patient with Dr. Johnsie Cancel.  Given overall clinical trajectory and patient desires, will move towards aortic valve intervention with likely TAVR (as patient does no longer interested in participation in clinical trial) and pericardial effusion evacuation.  I discussed with patient and daughter.  All questions answered.

## 2021-07-25 NOTE — Progress Notes (Signed)
[facility] PROGRESS NOTE    Progress Note  Barbara Mcconnell  GBM:[id number] DOB: 11/11/1934 DOA: 07/21/2021 PCP: Kristopher Glee., MD     Brief Narrative:   Barbara Mcconnell is an 87 y.o. female past medical history significant for MAC, essential hypertension, hyperlipidemia, Takotsubo cardiomyopathy, recently seen by pulmonary as an outpatient started on azithromycin for MAC infection sent to the ED by PCP ED for shortness of breath CT angio of the chest was done on Christmas Day showed no evidence of PE but it did show scattered tree-in-bud nodular density in lungs bedside echo showed worsening pericardial effusion cardiology was consulted recommended transfer to [facility]. 2D echo was done that showed a mean aortic gradient on 2D echo 20 mmHg and a moderate pericardial effusion which is circumferential.  Cardiac cath showed proximal RCA 50% stenosis stable mild to moderate coronary artery disease no indication for PCI   Assessment/Plan:   Shortness of breath multifactorial due to pericardial effusion and moderate to severe aortic stenosis: Lasix was held, she is currently on lisinopril and metoprolol. Cardiology on board CT of the coronaries with a mean gradient of 33 mmHg with severe calcium score. They recommended further evaluation with a TAVR's team, he also recommended to proceed with surgical consultation for pericardial effusion.  Acute on chronic diastolic heart failure:  Lasix on hold, further management per cardiology. Currently on metoprolol, diltiazem drip and lisinopril. Her blood pressure is well controlled.  I's and O's are poorly recorded.  Essential hypertension Blood pressure is well, see above for further details.  Hypervolemic hyponatremia: Sodium was 129.  Bronchiectasis with acute exacerbation: With a history of MAC continue azithromycin.  Transaminitis/cirrhosis: Right upper quadrant ultrasound revealed early cirrhosis.  Etiology unclear,  hepatitis A was positive statins were held. Discussed with GI they recommended follow-up as an outpatient.  Nonobstructive CAD: Elevated tropes, 50% stenosis of RCA in 2020.  Currently chest pain-free. Continue  aspirin  Peripheral vascular disease: CT of the chest showed right carotid artery has 90% stenosis she remains asymptomatic. 50% stenosis of the dominant left vertebral artery 50%-75% stenosis of the inferior mesenteric artery at its origin    DVT prophylaxis: lovenox Family Communication:none Status is: Inpatient  Remains inpatient appropriate because: Shortness of breath        Code Status:     Code Status Orders  (From admission, onward)           Start     Ordered   07/22/21 0013  Do not attempt resuscitation (DNR)  Continuous       Question Answer Comment  In the event of cardiac or respiratory ARREST Do not call a code blue   In the event of cardiac or respiratory ARREST Do not perform Intubation, CPR, defibrillation or ACLS   In the event of cardiac or respiratory ARREST Use medication by any route, position, wound care, and other measures to relive pain and suffering. May use oxygen, suction and manual treatment of airway obstruction as needed for comfort.      07/22/21 0012           Code Status History     Date Active Date Inactive Code Status Order ID Comments User Context   07/21/2021 2247 07/22/2021 0012 DNR [id number]  Toy Baker, MD ED   05/13/2019 1817 05/15/2019 2036 Full Code [id number]  [place], Peter M, MD Inpatient   05/13/2019 1817 05/13/2019 1817 Full Code [id number]  [place], Peter M, MD Inpatient  IV Access:   Peripheral IV   Procedures and diagnostic studies:   CT CHEST W CONTRAST  Result Date: 07/24/2021 CLINICAL DATA:  Thoracic aorta disease, post-op; Aneurysm, pelvis or lower extremity. Aneurysm, pelvis or lower extremity. Severe symptomatic aortic stenosis. EXAM: CT ANGIOGRAPHY CHEST, ABDOMEN  AND PELVIS TECHNIQUE: Multidetector CT imaging through the chest, abdomen and pelvis was performed using the standard protocol during bolus administration of intravenous contrast. Multiplanar reconstructed images and MIPs were obtained and reviewed to evaluate the vascular anatomy. CONTRAST:  153m OMNIPAQUE IOHEXOL 350 MG/ML SOLN COMPARISON:  07/18/2021 FINDINGS: CTA CHEST FINDINGS Cardiovascular: The thoracic aorta is of normal caliber, measuring 3.6 x 3.7 cm in its ascending segment, 2.9 x 2.6 cm within the arch just beyond the takeoff of the left subclavian artery, and 2.6 x 2.2 cm within its descending segment at the level of the left atrium. No intramural hematoma or dissection. Moderate calcified atherosclerotic plaque. Conventional arch vessel anatomic configuration. Less than 50% stenosis of the left subclavian artery at its origin. Extensive, irregular mixed atherosclerotic plaque at the right carotid bifurcation results in a greater than 90% stenosis of the proximal right internal carotid artery calcified atherosclerotic plaque at the left carotid bifurcation results in a less than 50% stenosis the proximal left internal carotid artery. Calcified atherosclerotic plaque results in a 50% stenosis of the dominant left vertebral artery at its origin. Mild cardiomegaly with left ventricular hypertrophy noted. Extensive atherosclerotic calcification of the aortic valve leaflets. The aortic valve is trileaflet. Moderate coronary artery calcification with probable stenting of the proximal right coronary artery. Moderate pericardial effusion is present, enlarged in size when compared to prior examination. Pericardial fluid appears slightly hyperdense suggesting proteinaceous or hemorrhagic fluid. No CT evidence of cardiac tamponade. The central pulmonary arteries are of normal caliber. Mediastinum/Nodes: No enlarged mediastinal, hilar, or axillary lymph nodes. Thyroid gland, trachea, and esophagus demonstrate no  significant findings. Lungs/Pleura: Small bilateral pleural effusions are present with compressive atelectasis of the a lower lobes bilaterally, left greater than right. There is cylindrical bronchiectasis and volume loss noted within the lingula and basilar right middle lobe. Mild peribronchial nodularity within these regions may reflect the sequela of remote inflammation or a chronic atypical infectious process as can be seen with mycobacterial or fungal infection. No confluent pulmonary infiltrate. No pneumothorax. No central obstructing lesion. Musculoskeletal: No acute bone abnormality. No lytic or blastic bone lesion. Review of the MIP images confirms the above findings. CTA ABDOMEN AND PELVIS FINDINGS VASCULAR Aorta: Normal caliber. No aneurysm or dissection. Moderate atherosclerotic calcification. No periaortic inflammatory change. Celiac: Less than 50% stenosis of the a celiac origin secondary to calcified atherosclerotic plaque. Distally widely patent. No aneurysm or dissection. SMA: Patent without evidence of aneurysm, dissection, vasculitis or significant stenosis. Renals: Single renal arteries bilaterally. Less than 50% stenosis of the origin of the right renal artery. Widely patent left renal artery. Both arteries demonstrate a a beaded appearance within there mid segments in keeping with changes of fibromuscular dysplasia. No aneurysm or dissection. IMA: 50-75% stenosis of the origin.  Distally widely patent. Inflow: Mild atherosclerotic calcification. No evidence of hemodynamically significant stenosis. No aneurysm or dissection. Common iliac arteries measure 8-9 mm in diameter bilaterally. External iliac arteries measure 6 mm in diameter bilaterally. Common femoral arteries measure 7 mm in diameter bilaterally. Veins: No obvious venous abnormality within the limitations of this arterial phase study. Review of the MIP images confirms the above findings. NON-VASCULAR Hepatobiliary: Geographic region  of hyperenhancement within  segment 2 of the liver may relate to underlying arterial portal shunting with a resultant transient hepatic attenuation difference (THAD). Similar region is seen within segment 4B of the liver. There are at least 3 arterial enhancing rounded lesions within segment 4 and segment 8 of the liver measuring up to 8 mm (image # 137/4) which are nonspecific but may represent small flash filled hemangioma. The liver is otherwise unremarkable. Gallbladder unremarkable. No intra or extrahepatic biliary ductal dilation. Pancreas: Unremarkable Spleen: Unremarkable Adrenals/Urinary Tract: The adrenal glands are unremarkable. The kidneys are normal in position. Moderate left renal asymmetric cortical atrophy. No enhancing intrarenal masses. No hydronephrosis. No intrarenal or ureteral calculi. The bladder is unremarkable. Stomach/Bowel: Stomach is within normal limits. Appendix appears normal. No evidence of bowel wall thickening, distention, or inflammatory changes. Lymphatic: No pathologic adenopathy within the abdomen and pelvis. Reproductive: Uterus and bilateral adnexa are unremarkable. Other: Surgical dressing noted within the right inguinal region. No abdominal wall hernia. Musculoskeletal: Degenerative changes are noted within the lumbar spine with grade 1 anterolisthesis of L4-5. No acute bone abnormality. No lytic or blastic bone lesion. Review of the MIP images confirms the above findings. IMPRESSION: No evidence of thoracoabdominal aortic aneurysm or dissection. Peripheral vascular disease with greater than 90% stenosis of the proximal right internal carotid artery. Plaque morphology appears irregular and may be better assessed with dedicated carotid artery Doppler sonography. 50% stenosis of the a dominant left vertebral artery at its origin. 50-75% stenosis of the inferior mesenteric artery at its origin, though this is of questionable clinical significance given patency of the celiac axis  and superior mesenteric artery. 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 Cardiomegaly with left ventricular hypertrophy. Extensive calcification of the aortic valve leaflets. Enlarging, moderate pericardial effusion without definite CT evidence of cardiac tamponade. Note, however, small bilateral pleural effusions have developed since prior examination and can be seen the setting of progressive cardiogenic failure. Pericardial fluid appears slightly hyperdense suggesting a proteinaceous or hemorrhagic component. Cylindrical bronchiectasis and volume loss within the lingula and basilar right middle lobe with associated peribronchial nodularity. This may reflect the sequela of remote inflammation or reflect changes of a chronic infectious process such as atypical mycobacterial or fungal pneumonia. Multiple subcentimeter arterially enhancing lesions within the liver possibly representing small flash filled hemangioma. These are not optimally characterized on this examination may be better assessed with dedicated MRI examination if clinically indicated. Aortic Atherosclerosis (ICD10-I70.0). Electronically Signed   By: Fidela Salisbury M.D.   On: 07/24/2021 21:16   CT ANGIO ABDOMEN W &/OR WO CONTRAST  Result Date: 07/24/2021 CLINICAL DATA:  Thoracic aorta disease, post-op; Aneurysm, pelvis or lower extremity. Aneurysm, pelvis or lower extremity. Severe symptomatic aortic stenosis. EXAM: CT ANGIOGRAPHY CHEST, ABDOMEN AND PELVIS TECHNIQUE: Multidetector CT imaging through the chest, abdomen and pelvis was performed using the standard protocol during bolus administration of intravenous contrast. Multiplanar reconstructed images and MIPs were obtained and reviewed to evaluate the vascular anatomy. CONTRAST:  173m OMNIPAQUE IOHEXOL 350 MG/ML SOLN COMPARISON:  07/18/2021 FINDINGS: CTA CHEST FINDINGS Cardiovascular: The thoracic aorta is of normal caliber,  measuring 3.6 x 3.7 cm in its ascending segment, 2.9 x 2.6 cm within the arch just beyond the takeoff of the left subclavian artery, and 2.6 x 2.2 cm within its descending segment at the level of the left atrium. No intramural hematoma or dissection. Moderate calcified atherosclerotic plaque. Conventional arch vessel anatomic configuration. Less than 50% stenosis of the left subclavian  artery at its origin. Extensive, irregular mixed atherosclerotic plaque at the right carotid bifurcation results in a greater than 90% stenosis of the proximal right internal carotid artery calcified atherosclerotic plaque at the left carotid bifurcation results in a less than 50% stenosis the proximal left internal carotid artery. Calcified atherosclerotic plaque results in a 50% stenosis of the dominant left vertebral artery at its origin. Mild cardiomegaly with left ventricular hypertrophy noted. Extensive atherosclerotic calcification of the aortic valve leaflets. The aortic valve is trileaflet. Moderate coronary artery calcification with probable stenting of the proximal right coronary artery. Moderate pericardial effusion is present, enlarged in size when compared to prior examination. Pericardial fluid appears slightly hyperdense suggesting proteinaceous or hemorrhagic fluid. No CT evidence of cardiac tamponade. The central pulmonary arteries are of normal caliber. Mediastinum/Nodes: No enlarged mediastinal, hilar, or axillary lymph nodes. Thyroid gland, trachea, and esophagus demonstrate no significant findings. Lungs/Pleura: Small bilateral pleural effusions are present with compressive atelectasis of the a lower lobes bilaterally, left greater than right. There is cylindrical bronchiectasis and volume loss noted within the lingula and basilar right middle lobe. Mild peribronchial nodularity within these regions may reflect the sequela of remote inflammation or a chronic atypical infectious process as can be seen with  mycobacterial or fungal infection. No confluent pulmonary infiltrate. No pneumothorax. No central obstructing lesion. Musculoskeletal: No acute bone abnormality. No lytic or blastic bone lesion. Review of the MIP images confirms the above findings. CTA ABDOMEN AND PELVIS FINDINGS VASCULAR Aorta: Normal caliber. No aneurysm or dissection. Moderate atherosclerotic calcification. No periaortic inflammatory change. Celiac: Less than 50% stenosis of the a celiac origin secondary to calcified atherosclerotic plaque. Distally widely patent. No aneurysm or dissection. SMA: Patent without evidence of aneurysm, dissection, vasculitis or significant stenosis. Renals: Single renal arteries bilaterally. Less than 50% stenosis of the origin of the right renal artery. Widely patent left renal artery. Both arteries demonstrate a a beaded appearance within there mid segments in keeping with changes of fibromuscular dysplasia. No aneurysm or dissection. IMA: 50-75% stenosis of the origin.  Distally widely patent. Inflow: Mild atherosclerotic calcification. No evidence of hemodynamically significant stenosis. No aneurysm or dissection. Common iliac arteries measure 8-9 mm in diameter bilaterally. External iliac arteries measure 6 mm in diameter bilaterally. Common femoral arteries measure 7 mm in diameter bilaterally. Veins: No obvious venous abnormality within the limitations of this arterial phase study. Review of the MIP images confirms the above findings. NON-VASCULAR Hepatobiliary: Geographic region of hyperenhancement within segment 2 of the liver may relate to underlying arterial portal shunting with a resultant transient hepatic attenuation difference (THAD). Similar region is seen within segment 4B of the liver. There are at least 3 arterial enhancing rounded lesions within segment 4 and segment 8 of the liver measuring up to 8 mm (image # 137/4) which are nonspecific but may represent small flash filled hemangioma. The liver  is otherwise unremarkable. Gallbladder unremarkable. No intra or extrahepatic biliary ductal dilation. Pancreas: Unremarkable Spleen: Unremarkable Adrenals/Urinary Tract: The adrenal glands are unremarkable. The kidneys are normal in position. Moderate left renal asymmetric cortical atrophy. No enhancing intrarenal masses. No hydronephrosis. No intrarenal or ureteral calculi. The bladder is unremarkable. Stomach/Bowel: Stomach is within normal limits. Appendix appears normal. No evidence of bowel wall thickening, distention, or inflammatory changes. Lymphatic: No pathologic adenopathy within the abdomen and pelvis. Reproductive: Uterus and bilateral adnexa are unremarkable. Other: Surgical dressing noted within the right inguinal region. No abdominal wall hernia. Musculoskeletal: Degenerative changes are noted within the  lumbar spine with grade 1 anterolisthesis of L4-5. No acute bone abnormality. No lytic or blastic bone lesion. Review of the MIP images confirms the above findings. IMPRESSION: No evidence of thoracoabdominal aortic aneurysm or dissection. Peripheral vascular disease with greater than 90% stenosis of the proximal right internal carotid artery. Plaque morphology appears irregular and may be better assessed with dedicated carotid artery Doppler sonography. 50% stenosis of the a dominant left vertebral artery at its origin. 50-75% stenosis of the inferior mesenteric artery at its origin, though this is of questionable clinical significance given patency of the celiac axis and superior mesenteric artery. 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 Cardiomegaly with left ventricular hypertrophy. Extensive calcification of the aortic valve leaflets. Enlarging, moderate pericardial effusion without definite CT evidence of cardiac tamponade. Note, however, small bilateral pleural effusions have developed since prior examination and can be seen the  setting of progressive cardiogenic failure. Pericardial fluid appears slightly hyperdense suggesting a proteinaceous or hemorrhagic component. Cylindrical bronchiectasis and volume loss within the lingula and basilar right middle lobe with associated peribronchial nodularity. This may reflect the sequela of remote inflammation or reflect changes of a chronic infectious process such as atypical mycobacterial or fungal pneumonia. Multiple subcentimeter arterially enhancing lesions within the liver possibly representing small flash filled hemangioma. These are not optimally characterized on this examination may be better assessed with dedicated MRI examination if clinically indicated. Aortic Atherosclerosis (ICD10-I70.0). Electronically Signed   By: Fidela Salisbury M.D.   On: 07/24/2021 21:16   CT CORONARY MORPH W/CTA COR W/SCORE W/CA W/CM &/OR WO/CM  Result Date: 07/25/2021 CLINICAL DATA:  Aortic Stenosis EXAM: Cardiac TAVR CT TECHNIQUE: The patient was scanned on a Siemens Force 212 slice scanner. A 120 kV retrospective scan was triggered in the ascending thoracic aorta at 140 HU's. Gantry rotation speed was 250 msecs and collimation was .6 mm. No beta blockade or nitro were given. The 3D data set was reconstructed in 5% intervals of the R-R cycle. Systolic and diastolic phases were analyzed on a dedicated work station using MPR, MIP and VRT modes. The patient received 80 cc of contrast. FINDINGS: Aortic Valve: Tri leaflet calcified score 2093 Aorta: Bovine arch no aneurysm moderate calcific atherosclerosis Sino-tubular Junction: 25 nn Ascending Thoracic Aorta: 35 mm Aortic Arch: 27 mm Descending Thoracic Aorta: 27 mm Sinus of Valsalva Measurements: Non-coronary: 29.3 mm Right - coronary: 28.6 mm Left -   coronary: 28 mm Coronary Artery Height above Annulus: Left Main: 15 mm above annulus Right Coronary: 15.6 mm above annulus Virtual Basal Annulus Measurements: Average  diameter: 22.8 mm Perimeter: 74.9 mm Area:  407 mm2 AVA by planimetry 1.01 cm2 Coronary Arteries: Sufficient height above annulus for deployment Optimum Fluoroscopic Angle for Delivery: LAO 20 Caudal 19 degrees IMPRESSION: 1. Tri leaflet AV with calcium score 2093 and planimetered AVA 1.01 cm2 2.  Annular area of 407 mm2 suitable for a 23 mm Sapien 3 valve 3.  Coronary arteries sufficient height above annulus for deployment 4. Optimal angiographic angle for deployment LAO 20 Caudal 19 degrees 5.  Large circumferential pericardial effusion [place] Rouge Electronically Signed   By: Jenkins Rouge M.D.   On: 07/25/2021 06:57     Medical Consultants:   None.   Subjective:    Barbara Mcconnell shortness of breath continues to be unchanged.  Objective:    Vitals:   07/24/21 1643 07/24/21 2015 07/25/21 0500 07/25/21 0805  BP: (!) 147/71 128/64  (!) 149/75  Pulse: 87     Resp: _0 Temp:      TempSrc: Oral     SpO2: 99% 98%  98%  Weight:   58.6 kg   Height:       SpO2: 98 % O2 Flow Rate (L/min): 2 L/min   Intake/Output Summary (Last 24 hours) at 07/25/2021 0928 Last data filed at 07/25/2021 2924 Gross per 24 hour  Intake 368.07 ml  Output --  Net 368.07 ml    Filed Weights   07/22/21 2115 07/24/21 0515 07/25/21 0500  Weight: 59.2 kg 58.3 kg 58.6 kg    Exam: General exam: In no acute distress. Respiratory system: Good air movement and clear to auscultation. Cardiovascular system: S1 & S2 heard, RRR. No JVD. Gastrointestinal system: Abdomen is nondistended, soft and nontender.  Extremities: No pedal edema. Skin: No rashes, lesions or ulcers Psychiatry: Judgement and insight appear normal. Mood & affect appropriate.   Data Reviewed:    Labs: Basic Metabolic Panel: Recent Labs  Lab 07/21/21 1948 07/22/21 0022 07/22/21 0421 07/23/21 0745 07/23/21 4628 07/23/21 6381 07/23/21 0943 07/24/21 0119  NA 128*  --  131* 129* 128* 128* 128* 129*  K 4.2  --  3.9 4.1 3.8 3.9 3.8 4.1  CL 93*  --  93* 93*  --   --   --   93*  CO2 27  --  28 25  --   --   --  27  GLUCOSE 112*  --  109* 106*  --   --   --  101*  BUN 20  --  21 18  --   --   --  22  CREATININE 0.93  --  1.09* 0.95  --   --   --  1.00  CALCIUM 8.9  --  9.1 8.9  --   --   --  9.1  MG 2.1  --  2.2  --   --   --   --   --   PHOS  --  3.0 3.4  --   --   --   --   --     GFR Estimated Creatinine Clearance: 33.5 mL/min (by C-G formula based on SCr of 1 mg/dL). Liver Function Tests: Recent Labs  Lab 07/21/21 1948 07/22/21 0421  AST 106* 70*  ALT 121* 99*  ALKPHOS 205* 175*  BILITOT 0.8 0.9  PROT 7.1 6.6  ALBUMIN 3.5 3.2*    No results for input(s): LIPASE, AMYLASE in the last 168 hours. No results for input(s): AMMONIA in the last 168 hours. Coagulation profile Recent Labs  Lab 07/21/21 1948  INR 1.0    COVID-19 Labs  No results for input(s): DDIMER, FERRITIN, LDH, CRP in the last 72 hours.   Lab Results  Component Value Date   SARSCOV2NAA NEGATIVE 07/21/2021   [place] NEGATIVE 05/13/2019    CBC: Recent Labs  Lab 07/21/21 1948 07/22/21 0421 07/23/21 0937 07/23/21 0942 07/23/21 0943  WBC 8.4 7.5  --   --   --   NEUTROABS 5.8 4.7  --   --   --   HGB 11.5* 11.3* 10.5* 10.9* 10.5*  HCT 35.6* 33.6* 31.0* 32.0* 31.0*  MCV 91.8 90.8  --   --   --   PLT 267 275  --   --   --     Cardiac Enzymes: Recent Labs  Lab 07/22/21 0022  CKTOTAL 49    BNP (last 3 results)  No results for input(s): PROBNP in the last 8760 hours. CBG: No results for input(s): GLUCAP in the last 168 hours. D-Dimer: No results for input(s): DDIMER in the last 72 hours.  Hgb A1c: No results for input(s): HGBA1C in the last 72 hours. Lipid Profile: No results for input(s): CHOL, HDL, LDLCALC, TRIG, CHOLHDL, LDLDIRECT in the last 72 hours. Thyroid function studies: No results for input(s): TSH, T4TOTAL, T3FREE, THYROIDAB in the last 72 hours.  Invalid input(s): FREET3  Anemia work up: No results for input(s): VITAMINB12, FOLATE,  FERRITIN, TIBC, IRON, RETICCTPCT in the last 72 hours. Sepsis Labs: Recent Labs  Lab 07/21/21 1948 07/22/21 0022 07/22/21 0421 07/22/21 2156  WBC 8.4  --  7.5  --   LATICACIDVEN  --  0.9  --  0.7    Microbiology Recent Results (from the past 240 hour(s))  Resp Panel by RT-PCR (Flu A&B, Covid) Nasopharyngeal Swab     Status: None   Collection Time: 07/21/21  7:48 PM   Specimen: Nasopharyngeal Swab; Nasopharyngeal(NP) swabs in vial transport medium  Result Value Ref Range Status   SARS Coronavirus 2 by RT PCR NEGATIVE NEGATIVE Final    Comment: (NOTE) SARS-CoV-2 target nucleic acids are NOT DETECTED.  The SARS-CoV-2 RNA is generally detectable in upper respiratory specimens during the acute phase of infection. The lowest concentration of SARS-CoV-2 viral copies this assay can detect is 138 copies/mL. A negative result does not preclude SARS-Cov-2 infection and should not be used as the sole basis for treatment or other patient management decisions. A negative result may occur with  improper specimen collection/handling, submission of specimen other than nasopharyngeal swab, presence of viral mutation(s) within the areas targeted by this assay, and inadequate number of viral copies(<138 copies/mL). A negative result must be combined with clinical observations, patient history, and epidemiological information. The expected result is Negative.  Fact Sheet for Patients:  EntrepreneurPulse.com.au  Fact Sheet for Healthcare Providers:  IncredibleEmployment.be  This test is no t yet approved or cleared by the [place] FDA and  has been authorized for detection and/or diagnosis of SARS-CoV-2 by FDA under an Emergency Use Authorization (EUA). This EUA will remain  in effect (meaning this test can be used) for the duration of the COVID-19 declaration under Section 564(b)(1) of the Act, 21 U.S.C.section 360bbb-3(b)(1), unless the authorization  is terminated  or revoked sooner.       Influenza A by PCR NEGATIVE NEGATIVE Final   Influenza B by PCR NEGATIVE NEGATIVE Final    Comment: (NOTE) The Xpert Xpress SARS-CoV-2/FLU/RSV plus assay is intended as an aid in the diagnosis of influenza from Nasopharyngeal swab specimens and should not be used as a sole basis for treatment. Nasal washings and aspirates are unacceptable for Xpert Xpress SARS-CoV-2/FLU/RSV testing.  Fact Sheet for Patients: EntrepreneurPulse.com.au  Fact Sheet for Healthcare Providers: IncredibleEmployment.be  This test is not yet approved or cleared by the [place] FDA and has been authorized for detection and/or diagnosis of SARS-CoV-2 by FDA under an Emergency Use Authorization (EUA). This EUA will remain in effect (meaning this test can be used) for the duration of the COVID-19 declaration under Section 564(b)(1) of the Act, 21 U.S.C. section 360bbb-3(b)(1), unless the authorization is terminated or revoked.  Performed at [facility], [place] [address], [place], [place] [address]      Medications:    aspirin EC  81 mg Oral Daily   azithromycin  250 mg Oral Daily   cholecalciferol  1,000 Units Oral Daily   docusate sodium  100 mg Oral Q breakfast   escitalopram  5 mg Oral Daily   lidocaine  1 patch Transdermal Q24H   lisinopril  25 mg Oral Daily   loratadine  10 mg Oral Daily   metoprolol tartrate  25 mg Oral BID   montelukast  10 mg Oral QPC supper   pantoprazole  40 mg Oral Daily   sodium chloride flush  3 mL Intravenous Q12H   Continuous Infusions:  sodium chloride     diltiazem (CARDIZEM) infusion 5 mg/hr (07/24/21 1402)      LOS: 4 days   Charlynne Cousins  [facility]  07/25/2021, 9:28 AM

## 2021-07-25 NOTE — Plan of Care (Signed)
°  Problem: Activity: Goal: Capacity to carry out activities will improve Outcome: Progressing   Problem: Clinical Measurements: Goal: Will remain free from infection Outcome: Progressing   Problem: Activity: Goal: Risk for activity intolerance will decrease Outcome: Progressing

## 2021-07-26 DIAGNOSIS — I3139 Other pericardial effusion (noninflammatory): Secondary | ICD-10-CM | POA: Diagnosis not present

## 2021-07-26 MED ORDER — SODIUM CHLORIDE 0.9% FLUSH: 3.0000 mL | INTRAVENOUS | Status: DC | PRN

## 2021-07-26 MED ORDER — SODIUM CHLORIDE 0.9 % IV SOLN: 250.0000 mL | INTRAVENOUS | Status: DC | PRN

## 2021-07-26 MED ORDER — DILTIAZEM HCL 60 MG PO TABS
30.0000 mg | ORAL_TABLET | Freq: Four times a day (QID) | ORAL | Status: DC
  Administered 2021-07-26 – 2021-07-27 (×4): 30 mg via ORAL
  Filled 2021-07-26 (×4): qty 1

## 2021-07-26 MED ORDER — SODIUM CHLORIDE 0.9 % IV SOLN: INTRAVENOUS | Status: DC

## 2021-07-26 MED ORDER — SORBITOL 70 % SOLN
400.0000 mL | TOPICAL_OIL | Freq: Once | ORAL | Status: DC
  Filled 2021-07-26 (×2): qty 120

## 2021-07-26 MED ORDER — POLYETHYLENE GLYCOL 3350 17 G PO PACK
17.0000 g | PACK | Freq: Two times a day (BID) | ORAL | Status: AC
  Administered 2021-07-26 – 2021-07-27 (×4): 17 g via ORAL
  Filled 2021-07-26 (×4): qty 1

## 2021-07-26 MED ORDER — SODIUM CHLORIDE 0.9% FLUSH
3.0000 mL | Freq: Two times a day (BID) | INTRAVENOUS | Status: DC
  Administered 2021-07-26 – 2021-07-27 (×3): 3 mL via INTRAVENOUS

## 2021-07-26 MED ORDER — ASPIRIN 81 MG PO CHEW
81.0000 mg | CHEWABLE_TABLET | ORAL | Status: AC
  Administered 2021-07-27: 81 mg via ORAL
  Filled 2021-07-26: qty 1

## 2021-07-26 NOTE — Progress Notes (Signed)
Progress Note  Patient Name: Barbara Mcconnell Date of Encounter: 07/26/2021  [facility] Cardiologist: Peter [place], MD    Subjective   87 year old female with a history of aortic stenosis, progressive dyspnea on exertion.  She has been found to have a moderate sized pericardial effusion.  She went into atrial fibrillation yesterday.  She is converted back to sinus rhythm at this point.  She is being worked up for a TAVR. CT scan yesterday was performed as part of a preoperative work-up.  The pericardial effusion appears to be large and the plan is for pericardiocentesis on Tuesday.  Inpatient Medications    Scheduled Meds:  aspirin EC  81 mg Oral Daily   azithromycin  250 mg Oral Daily   cholecalciferol  1,000 Units Oral Daily   docusate sodium  100 mg Oral Q breakfast   escitalopram  5 mg Oral Daily   lidocaine  1 patch Transdermal Q24H   lisinopril  25 mg Oral Daily   loratadine  10 mg Oral Daily   metoprolol tartrate  25 mg Oral BID   montelukast  10 mg Oral QPC supper   pantoprazole  40 mg Oral Daily   sodium chloride flush  3 mL Intravenous Q12H   Continuous Infusions:  sodium chloride     diltiazem (CARDIZEM) infusion 5 mg/hr (07/25/21 1658)   PRN Meds: sodium chloride, acetaminophen, acetaminophen, albuterol, ondansetron (ZOFRAN) IV, sodium chloride flush   Vital Signs    Vitals:   07/25/21 2049 07/25/21 2136 07/26/21 0027 07/26/21 0416  BP: (!) 172/75 (!) 183/78 130/79 (!) 169/83  Pulse: 87 81 66 67  Resp: 20  19 18   Temp: 97.7 F (36.5 C)  97.7 F (36.5 C) 97.8 F (36.6 C)  TempSrc: Oral  Oral Oral  SpO2: 95%  95% 95%  Weight:    58.8 kg  Height:        Intake/Output Summary (Last 24 hours) at 07/26/2021 2585 Last data filed at 07/25/2021 1526 Gross per 24 hour  Intake 40.08 ml  Output --  Net 40.08 ml   Last 3 Weights 07/26/2021 07/25/2021 07/24/2021  Weight (lbs) 129 lb 9.6 oz 129 lb 3.2 oz 128 lb 8 oz  Weight (kg) 58.786 kg 58.605 kg 58.287 kg       Telemetry    NSR - Personally Reviewed  ECG     - Personally Reviewed  Physical Exam   GEN:   elderly female,  NAD  Neck: No JVD Cardiac: RRR, 2-3/6 systolic murmur  Respiratory: Clear to auscultation bilaterally. GI: Soft, nontender, non-distended  MS: No edema; No deformity. Neuro:  Nonfocal  Psych: Normal affect   Labs    High Sensitivity Troponin:   Recent Labs  Lab 06/30/21 1532 07/21/21 1948 07/21/21 2152 07/22/21 0022 07/22/21 0421  TROPONINIHS 13 63* 56* 51* 48*     Chemistry Recent Labs  Lab 07/21/21 1948 07/22/21 0421 07/23/21 0745 07/23/21 0937 07/23/21 0942 07/23/21 0943 07/24/21 0119  NA 128* 131* 129*   < > 128* 128* 129*  K 4.2 3.9 4.1   < > 3.9 3.8 4.1  CL 93* 93* 93*  --   --   --  93*  CO2 27 28 25   --   --   --  27  GLUCOSE 112* 109* 106*  --   --   --  101*  BUN 20 21 18   --   --   --  22  CREATININE 0.93 1.09* 0.95  --   --   --  1.00  CALCIUM 8.9 9.1 8.9  --   --   --  9.1  MG 2.1 2.2  --   --   --   --   --   PROT 7.1 6.6  --   --   --   --   --   ALBUMIN 3.5 3.2*  --   --   --   --   --   AST 106* 70*  --   --   --   --   --   ALT 121* 99*  --   --   --   --   --   ALKPHOS 205* 175*  --   --   --   --   --   BILITOT 0.8 0.9  --   --   --   --   --   GFRNONAA >60 50* 59*  --   --   --  56*  ANIONGAP 8 10 11   --   --   --  9   < > = values in this interval not displayed.    Lipids No results for input(s): CHOL, TRIG, HDL, LABVLDL, LDLCALC, CHOLHDL in the last 168 hours.  Hematology Recent Labs  Lab 07/21/21 1948 07/22/21 0421 07/23/21 [id number] 07/23/21 0942 07/23/21 0943  WBC 8.4 7.5  --   --   --   RBC 3.88 3.70*  --   --   --   HGB 11.5* 11.3* 10.5* 10.9* 10.5*  HCT 35.6* 33.6* 31.0* 32.0* 31.0*  MCV 91.8 90.8  --   --   --   MCH 29.6 30.5  --   --   --   MCHC 32.3 33.6  --   --   --   RDW 13.4 13.4  --   --   --   PLT 267 275  --   --   --    Thyroid  Recent Labs  Lab 07/22/21 0421  TSH 1.472    BNP Recent  Labs  Lab 07/21/21 1948  BNP 201.8*    DDimer  Recent Labs  Lab 07/21/21 1948  DDIMER 1.58*     Radiology    CT CHEST W CONTRAST  Result Date: 07/24/2021 CLINICAL DATA:  Thoracic aorta disease, post-op; Aneurysm, pelvis or lower extremity. Aneurysm, pelvis or lower extremity. Severe symptomatic aortic stenosis. EXAM: CT ANGIOGRAPHY CHEST, ABDOMEN AND PELVIS TECHNIQUE: Multidetector CT imaging through the chest, abdomen and pelvis was performed using the standard protocol during bolus administration of intravenous contrast. Multiplanar reconstructed images and MIPs were obtained and reviewed to evaluate the vascular anatomy. CONTRAST:  181mL OMNIPAQUE IOHEXOL 350 MG/ML SOLN COMPARISON:  07/18/2021 FINDINGS: CTA CHEST FINDINGS Cardiovascular: The thoracic aorta is of normal caliber, measuring 3.6 x 3.7 cm in its ascending segment, 2.9 x 2.6 cm within the arch just beyond the takeoff of the left subclavian artery, and 2.6 x 2.2 cm within its descending segment at the level of the left atrium. No intramural hematoma or dissection. Moderate calcified atherosclerotic plaque. Conventional arch vessel anatomic configuration. Less than 50% stenosis of the left subclavian artery at its origin. Extensive, irregular mixed atherosclerotic plaque at the right carotid bifurcation results in a greater than 90% stenosis of the proximal right internal carotid artery calcified atherosclerotic plaque at the left carotid bifurcation results in a less than 50% stenosis the proximal left internal carotid artery. Calcified atherosclerotic plaque results in a 50% stenosis of the dominant left vertebral artery  at its origin. Mild cardiomegaly with left ventricular hypertrophy noted. Extensive atherosclerotic calcification of the aortic valve leaflets. The aortic valve is trileaflet. Moderate coronary artery calcification with probable stenting of the proximal right coronary artery. Moderate pericardial effusion is present,  enlarged in size when compared to prior examination. Pericardial fluid appears slightly hyperdense suggesting proteinaceous or hemorrhagic fluid. No CT evidence of cardiac tamponade. The central pulmonary arteries are of normal caliber. Mediastinum/Nodes: No enlarged mediastinal, hilar, or axillary lymph nodes. Thyroid gland, trachea, and esophagus demonstrate no significant findings. Lungs/Pleura: Small bilateral pleural effusions are present with compressive atelectasis of the a lower lobes bilaterally, left greater than right. There is cylindrical bronchiectasis and volume loss noted within the lingula and basilar right middle lobe. Mild peribronchial nodularity within these regions may reflect the sequela of remote inflammation or a chronic atypical infectious process as can be seen with mycobacterial or fungal infection. No confluent pulmonary infiltrate. No pneumothorax. No central obstructing lesion. Musculoskeletal: No acute bone abnormality. No lytic or blastic bone lesion. Review of the MIP images confirms the above findings. CTA ABDOMEN AND PELVIS FINDINGS VASCULAR Aorta: Normal caliber. No aneurysm or dissection. Moderate atherosclerotic calcification. No periaortic inflammatory change. Celiac: Less than 50% stenosis of the a celiac origin secondary to calcified atherosclerotic plaque. Distally widely patent. No aneurysm or dissection. SMA: Patent without evidence of aneurysm, dissection, vasculitis or significant stenosis. Renals: Single renal arteries bilaterally. Less than 50% stenosis of the origin of the right renal artery. Widely patent left renal artery. Both arteries demonstrate a a beaded appearance within there mid segments in keeping with changes of fibromuscular dysplasia. No aneurysm or dissection. IMA: 50-75% stenosis of the origin.  Distally widely patent. Inflow: Mild atherosclerotic calcification. No evidence of hemodynamically significant stenosis. No aneurysm or dissection. Common  iliac arteries measure 8-9 mm in diameter bilaterally. External iliac arteries measure 6 mm in diameter bilaterally. Common femoral arteries measure 7 mm in diameter bilaterally. Veins: No obvious venous abnormality within the limitations of this arterial phase study. Review of the MIP images confirms the above findings. NON-VASCULAR Hepatobiliary: Geographic region of hyperenhancement within segment 2 of the liver may relate to underlying arterial portal shunting with a resultant transient hepatic attenuation difference (THAD). Similar region is seen within segment 4B of the liver. There are at least 3 arterial enhancing rounded lesions within segment 4 and segment 8 of the liver measuring up to 8 mm (image # 137/4) which are nonspecific but may represent small flash filled hemangioma. The liver is otherwise unremarkable. Gallbladder unremarkable. No intra or extrahepatic biliary ductal dilation. Pancreas: Unremarkable Spleen: Unremarkable Adrenals/Urinary Tract: The adrenal glands are unremarkable. The kidneys are normal in position. Moderate left renal asymmetric cortical atrophy. No enhancing intrarenal masses. No hydronephrosis. No intrarenal or ureteral calculi. The bladder is unremarkable. Stomach/Bowel: Stomach is within normal limits. Appendix appears normal. No evidence of bowel wall thickening, distention, or inflammatory changes. Lymphatic: No pathologic adenopathy within the abdomen and pelvis. Reproductive: Uterus and bilateral adnexa are unremarkable. Other: Surgical dressing noted within the right inguinal region. No abdominal wall hernia. Musculoskeletal: Degenerative changes are noted within the lumbar spine with grade 1 anterolisthesis of L4-5. No acute bone abnormality. No lytic or blastic bone lesion. Review of the MIP images confirms the above findings. IMPRESSION: No evidence of thoracoabdominal aortic aneurysm or dissection. Peripheral vascular disease with greater than 90% stenosis of the  proximal right internal carotid artery. Plaque morphology appears irregular and may be better assessed with dedicated  carotid artery Doppler sonography. 50% stenosis of the a dominant left vertebral artery at its origin. 50-75% stenosis of the inferior mesenteric artery at its origin, though this is of questionable clinical significance given patency of the celiac axis and superior mesenteric artery. 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 Cardiomegaly with left ventricular hypertrophy. Extensive calcification of the aortic valve leaflets. Enlarging, moderate pericardial effusion without definite CT evidence of cardiac tamponade. Note, however, small bilateral pleural effusions have developed since prior examination and can be seen the setting of progressive cardiogenic failure. Pericardial fluid appears slightly hyperdense suggesting a proteinaceous or hemorrhagic component. Cylindrical bronchiectasis and volume loss within the lingula and basilar right middle lobe with associated peribronchial nodularity. This may reflect the sequela of remote inflammation or reflect changes of a chronic infectious process such as atypical mycobacterial or fungal pneumonia. Multiple subcentimeter arterially enhancing lesions within the liver possibly representing small flash filled hemangioma. These are not optimally characterized on this examination may be better assessed with dedicated MRI examination if clinically indicated. Aortic Atherosclerosis (ICD10-I70.0). Electronically Signed   By: Fidela Salisbury M.D.   On: 07/24/2021 21:16   CT ANGIO ABDOMEN W &/OR WO CONTRAST  Result Date: 07/24/2021 CLINICAL DATA:  Thoracic aorta disease, post-op; Aneurysm, pelvis or lower extremity. Aneurysm, pelvis or lower extremity. Severe symptomatic aortic stenosis. EXAM: CT ANGIOGRAPHY CHEST, ABDOMEN AND PELVIS TECHNIQUE: Multidetector CT imaging through the chest, abdomen and  pelvis was performed using the standard protocol during bolus administration of intravenous contrast. Multiplanar reconstructed images and MIPs were obtained and reviewed to evaluate the vascular anatomy. CONTRAST:  13mL OMNIPAQUE IOHEXOL 350 MG/ML SOLN COMPARISON:  07/18/2021 FINDINGS: CTA CHEST FINDINGS Cardiovascular: The thoracic aorta is of normal caliber, measuring 3.6 x 3.7 cm in its ascending segment, 2.9 x 2.6 cm within the arch just beyond the takeoff of the left subclavian artery, and 2.6 x 2.2 cm within its descending segment at the level of the left atrium. No intramural hematoma or dissection. Moderate calcified atherosclerotic plaque. Conventional arch vessel anatomic configuration. Less than 50% stenosis of the left subclavian artery at its origin. Extensive, irregular mixed atherosclerotic plaque at the right carotid bifurcation results in a greater than 90% stenosis of the proximal right internal carotid artery calcified atherosclerotic plaque at the left carotid bifurcation results in a less than 50% stenosis the proximal left internal carotid artery. Calcified atherosclerotic plaque results in a 50% stenosis of the dominant left vertebral artery at its origin. Mild cardiomegaly with left ventricular hypertrophy noted. Extensive atherosclerotic calcification of the aortic valve leaflets. The aortic valve is trileaflet. Moderate coronary artery calcification with probable stenting of the proximal right coronary artery. Moderate pericardial effusion is present, enlarged in size when compared to prior examination. Pericardial fluid appears slightly hyperdense suggesting proteinaceous or hemorrhagic fluid. No CT evidence of cardiac tamponade. The central pulmonary arteries are of normal caliber. Mediastinum/Nodes: No enlarged mediastinal, hilar, or axillary lymph nodes. Thyroid gland, trachea, and esophagus demonstrate no significant findings. Lungs/Pleura: Small bilateral pleural effusions are  present with compressive atelectasis of the a lower lobes bilaterally, left greater than right. There is cylindrical bronchiectasis and volume loss noted within the lingula and basilar right middle lobe. Mild peribronchial nodularity within these regions may reflect the sequela of remote inflammation or a chronic atypical infectious process as can be seen with mycobacterial or fungal infection. No confluent pulmonary infiltrate. No pneumothorax. No central obstructing lesion. Musculoskeletal: No acute bone abnormality. No lytic  or blastic bone lesion. Review of the MIP images confirms the above findings. CTA ABDOMEN AND PELVIS FINDINGS VASCULAR Aorta: Normal caliber. No aneurysm or dissection. Moderate atherosclerotic calcification. No periaortic inflammatory change. Celiac: Less than 50% stenosis of the a celiac origin secondary to calcified atherosclerotic plaque. Distally widely patent. No aneurysm or dissection. SMA: Patent without evidence of aneurysm, dissection, vasculitis or significant stenosis. Renals: Single renal arteries bilaterally. Less than 50% stenosis of the origin of the right renal artery. Widely patent left renal artery. Both arteries demonstrate a a beaded appearance within there mid segments in keeping with changes of fibromuscular dysplasia. No aneurysm or dissection. IMA: 50-75% stenosis of the origin.  Distally widely patent. Inflow: Mild atherosclerotic calcification. No evidence of hemodynamically significant stenosis. No aneurysm or dissection. Common iliac arteries measure 8-9 mm in diameter bilaterally. External iliac arteries measure 6 mm in diameter bilaterally. Common femoral arteries measure 7 mm in diameter bilaterally. Veins: No obvious venous abnormality within the limitations of this arterial phase study. Review of the MIP images confirms the above findings. NON-VASCULAR Hepatobiliary: Geographic region of hyperenhancement within segment 2 of the liver may relate to underlying  arterial portal shunting with a resultant transient hepatic attenuation difference (THAD). Similar region is seen within segment 4B of the liver. There are at least 3 arterial enhancing rounded lesions within segment 4 and segment 8 of the liver measuring up to 8 mm (image # 137/4) which are nonspecific but may represent small flash filled hemangioma. The liver is otherwise unremarkable. Gallbladder unremarkable. No intra or extrahepatic biliary ductal dilation. Pancreas: Unremarkable Spleen: Unremarkable Adrenals/Urinary Tract: The adrenal glands are unremarkable. The kidneys are normal in position. Moderate left renal asymmetric cortical atrophy. No enhancing intrarenal masses. No hydronephrosis. No intrarenal or ureteral calculi. The bladder is unremarkable. Stomach/Bowel: Stomach is within normal limits. Appendix appears normal. No evidence of bowel wall thickening, distention, or inflammatory changes. Lymphatic: No pathologic adenopathy within the abdomen and pelvis. Reproductive: Uterus and bilateral adnexa are unremarkable. Other: Surgical dressing noted within the right inguinal region. No abdominal wall hernia. Musculoskeletal: Degenerative changes are noted within the lumbar spine with grade 1 anterolisthesis of L4-5. No acute bone abnormality. No lytic or blastic bone lesion. Review of the MIP images confirms the above findings. IMPRESSION: No evidence of thoracoabdominal aortic aneurysm or dissection. Peripheral vascular disease with greater than 90% stenosis of the proximal right internal carotid artery. Plaque morphology appears irregular and may be better assessed with dedicated carotid artery Doppler sonography. 50% stenosis of the a dominant left vertebral artery at its origin. 50-75% stenosis of the inferior mesenteric artery at its origin, though this is of questionable clinical significance given patency of the celiac axis and superior mesenteric artery. Morphologic changes in keeping with  fibromuscular dysplasia involving the renal arteries bilaterally. Cardiomegaly with left ventricular hypertrophy. Extensive calcification of the aortic valve leaflets. Enlarging, moderate pericardial effusion without definite CT evidence of cardiac tamponade. Note, however, small bilateral pleural effusions have developed since prior examination and can be seen the setting of progressive cardiogenic failure. Pericardial fluid appears slightly hyperdense suggesting a proteinaceous or hemorrhagic component. Cylindrical bronchiectasis and volume loss within the lingula and basilar right middle lobe with associated peribronchial nodularity. This may reflect the sequela of remote inflammation or reflect changes of a chronic infectious process such as atypical mycobacterial or fungal pneumonia. Multiple subcentimeter arterially enhancing lesions within the liver possibly representing small flash filled hemangioma. These are not optimally characterized on this examination may  be better assessed with dedicated MRI examination if clinically indicated. Aortic Atherosclerosis (ICD10-I70.0). Electronically Signed   By: Fidela Salisbury M.D.   On: 07/24/2021 21:16   CT CORONARY MORPH W/CTA COR W/SCORE W/CA W/CM &/OR WO/CM  Result Date: 07/25/2021 CLINICAL DATA:  Aortic Stenosis EXAM: Cardiac TAVR CT TECHNIQUE: The patient was scanned on a Siemens Force [id number] slice scanner. A 120 kV retrospective scan was triggered in the ascending thoracic aorta at 140 HU's. Gantry rotation speed was 250 msecs and collimation was .6 mm. No beta blockade or nitro were given. The 3D data set was reconstructed in 5% intervals of the R-R cycle. Systolic and diastolic phases were analyzed on a dedicated work station using MPR, MIP and VRT modes. The patient received 80 cc of contrast. FINDINGS: Aortic Valve: Tri leaflet calcified score 2093 Aorta: Bovine arch no aneurysm moderate calcific atherosclerosis Sino-tubular Junction: 25 nn Ascending Thoracic  Aorta: 35 mm Aortic Arch: 27 mm Descending Thoracic Aorta: 27 mm Sinus of Valsalva Measurements: Non-coronary: 29.3 mm Right - coronary: 28.6 mm Left -   coronary: 28 mm Coronary Artery Height above Annulus: Left Main: 15 mm above annulus Right Coronary: 15.6 mm above annulus Virtual Basal Annulus Measurements: Average  diameter: 22.8 mm Perimeter: 74.9 mm Area: 407 mm2 AVA by planimetry 1.01 cm2 Coronary Arteries: Sufficient height above annulus for deployment Optimum Fluoroscopic Angle for Delivery: LAO 20 Caudal 19 degrees IMPRESSION: 1. Tri leaflet AV with calcium score 2093 and planimetered AVA 1.01 cm2 2.  Annular area of 407 mm2 suitable for a 23 mm Sapien 3 valve 3.  Coronary arteries sufficient height above annulus for deployment 4. Optimal angiographic angle for deployment LAO 20 Caudal 19 degrees 5.  Large circumferential pericardial effusion Jenkins Rouge Electronically Signed   By: Jenkins Rouge M.D.   On: 07/25/2021 06:57    Cardiac Studies      Patient Profile     87 y.o. female  with AS, pericardial effusion   Assessment & Plan    1  AS :   tenatively planned for TAVR .  Needs surgical consultation  It looks like that the plan is to have a pericardiocentesis and then discharge her to home for surgical.  Plan for OP TAVR   2.  Pericardial effusion:  plan is for pericardiocentesis tomorrow per Dr Johnsie Cancel .    Orders written   3.  CAD :  mild CAD .  Insignificant .       For questions or updates, please contact [place] Please consult [email or web address] for contact info under        Signed, Mertie Moores, MD  07/26/2021, 8:22 AM

## 2021-07-26 NOTE — Progress Notes (Signed)
Mobility Specialist Progress Note    07/26/21 1627  Mobility  Activity Ambulated in hall  Level of Assistance Contact guard assist, steadying assist  Assistive Device Front wheel walker  Distance Ambulated (ft) 290 ft  Mobility Ambulated with assistance in hallway  Mobility Response Tolerated fair  Mobility performed by Mobility specialist  Bed Position Chair  $Mobility charge 1 Mobility   Pre-Mobility: 75 HR, 95% SpO2  Pt received in chair and agreeable. Encouraged pursed lip breathing. Took x3 short standing rest breaks. Left in chair with call bell in reach and RN present.   [facility] Mobility Specialist  M.S. Primary Phone: [phone] M.S. Secondary Phone: [phone]

## 2021-07-26 NOTE — Progress Notes (Signed)
Was notified by central tele.that Pt.converted to A fib. the patient was asymptomatic.B/P=133/85.hr=102.;MD on call made aware.EKG done.

## 2021-07-26 NOTE — Progress Notes (Signed)
Occupational Therapy Treatment Patient Details Name: Barbara Mcconnell MRN: [id number] DOB: 1935-03-19 Today's Date: 07/26/2021   History of present illness 87 y.o. female sent to the ED by PCP on 12/28 to ED for shortness of breath. 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   OT comments  Patient continues to make steady progress towards goals in skilled OT session. Patient's session encompassed  functional ambulation in order to complete household distances. Patient remains frustrated with her progress, however was able to complete 5-6 laps in the room without seated rest break on RA with VSS. Patient with increased WOB at end of session but with good demonstration of pursed lip breathing. Therapy continues to recommend home health; will continue to follow in house.    Recommendations for follow up therapy are one component of a multi-disciplinary discharge planning process, led by the attending physician.  Recommendations may be updated based on patient status, additional functional criteria and insurance authorization.    Follow Up Recommendations  Home health OT    Assistance Recommended at Discharge Intermittent Supervision/Assistance  Equipment Recommendations  Tub/shower seat    Recommendations for Other Services      Precautions / Restrictions Precautions Precautions: Fall Precaution Comments: monitor HR and sats Restrictions Weight Bearing Restrictions: No       Mobility Bed Mobility               General bed mobility comments: up in chair upon arrival    Transfers Overall transfer level: Independent   Transfers: Sit to/from Stand                   Balance Overall balance assessment: Needs assistance Sitting-balance  support: Feet supported Sitting balance-Leahy Scale: Good     Standing balance support: No upper extremity supported;During functional activity Standing balance-Leahy Scale: Good                             ADL either performed or assessed with clinical judgement   ADL Overall ADL's : Needs assistance/impaired                         Toilet Transfer: Ambulation;Supervision/safety Toilet Transfer Details (indicate cue type and reason): simluated with multiple bouts of ambulation in room, reports she is going to the bathroom independently throughout the day         Functional mobility during ADLs: Supervision/safety General ADL Comments: improved activity tolerance, but reports she is not at her baseline, increased WOB with VSS after ambulating mutiple laps in room    Extremity/Trunk Assessment              Vision       Perception     Praxis      Cognition Arousal/Alertness: Awake/alert Behavior During Therapy: WFL for tasks assessed/performed Overall Cognitive Status: Within Functional Limits for tasks assessed                                            Exercises     Shoulder Instructions       General Comments      Pertinent Vitals/ Pain  Pain Assessment: No/denies pain  Home Living                                          Prior Functioning/Environment              Frequency  Min 2X/week        Progress Toward Goals  OT Goals(current goals can now be found in the care plan section)  Progress towards OT goals: Progressing toward goals  Acute Rehab OT Goals Patient Stated Goal: back to PLOF OT Goal Formulation: With patient Time For Goal Achievement: 08/07/21 Potential to Achieve Goals: Good  Plan Discharge plan remains appropriate    Co-evaluation                 AM-PAC OT "6 Clicks" Daily Activity     Outcome Measure   Help from another person eating meals?:  None Help from another person taking care of personal grooming?: None Help from another person toileting, which includes using toliet, bedpan, or urinal?: A Little Help from another person bathing (including washing, rinsing, drying)?: A Little Help from another person to put on and taking off regular upper body clothing?: None Help from another person to put on and taking off regular lower body clothing?: A Little 6 Click Score: 21    End of Session    OT Visit Diagnosis: Muscle weakness (generalized) (M62.81);Other abnormalities of gait and mobility (R26.89)   Activity Tolerance Patient tolerated treatment well   Patient Left in chair;with call bell/phone within reach   Nurse Communication Mobility status        Time: 9292-4462 OT Time Calculation (min): 25 min  Charges: OT General Charges $OT Visit: 1 Visit OT Treatments $Self Care/Home Management : 23-37 mins  Corinne [place]. [place], [facility] [place] 07/26/2021, 1:31 PM

## 2021-07-26 NOTE — Progress Notes (Signed)
[facility] PROGRESS NOTE    Progress Note  Barbara Mcconnell  YSA:[id number] DOB: [birth year] DOA: 07/21/2021 PCP: Kristopher Glee., MD     Brief Narrative:   Barbara Mcconnell is an 87 y.o. female past medical history significant for MAC, essential hypertension, hyperlipidemia, Takotsubo cardiomyopathy, recently seen by pulmonary as an outpatient started on azithromycin for MAC infection sent to the ED by PCP ED for shortness of breath CT angio of the chest was done on Christmas Day showed no evidence of PE but it did show scattered tree-in-bud nodular density in lungs bedside echo showed worsening pericardial effusion cardiology was consulted recommended transfer to [facility]. 2D echo was done that showed a mean aortic gradient on 2D echo 20 mmHg and a moderate pericardial effusion which is circumferential.  Cardiac cath showed proximal RCA 50% stenosis stable mild to moderate coronary artery disease no indication for PCI   Assessment/Plan:   Shortness of breath multifactorial due to pericardial effusion and moderate to severe aortic stenosis: She is currently on lisinopril and metoprolol. Cardiology on board CT of the coronaries with a mean gradient of 33 mmHg with severe calcium score. They recommended further evaluation with a TAVR's team, he also recommended to proceed with surgical consultation for pericardial effusion.  Acute on chronic diastolic heart failure:  Lasix on hold, further management per cardiology. Currently on metoprolol and lisinopril, will transition IV dill drip to oral. Her blood pressure is well controlled.  I's and O's are poorly recorded.  New onset paroxysmal atrial fibrillation: Now in sinus rhythm rate control. Chads Vascor greater than 2, will discuss with cardiology to start a DOAC.  Essential hypertension Blood pressure is well, see above for further details.  Hypervolemic hyponatremia: Sodium was 129.  Bronchiectasis with acute  exacerbation: With a history of MAC continue azithromycin.  Transaminitis/cirrhosis: Right upper quadrant ultrasound revealed early cirrhosis.  Etiology unclear, hepatitis A was positive statins were held. Discussed with GI they recommended follow-up as an outpatient.  Nonobstructive CAD: Elevated tropes, 50% stenosis of RCA in 2020.  Currently chest pain-free. Continue  aspirin  Peripheral vascular disease: CT of the chest showed right carotid artery has 90% stenosis she remains asymptomatic. 50% stenosis of the dominant left vertebral artery 50%-75% stenosis of the inferior mesenteric artery at its origin    DVT prophylaxis: lovenox Family Communication:none Status is: Inpatient  Remains inpatient appropriate because: Shortness of breath        Code Status:     Code Status Orders  (From admission, onward)           Start     Ordered   07/22/21 0013  Do not attempt resuscitation (DNR)  Continuous       Question Answer Comment  In the event of cardiac or respiratory ARREST Do not call a code blue   In the event of cardiac or respiratory ARREST Do not perform Intubation, CPR, defibrillation or ACLS   In the event of cardiac or respiratory ARREST Use medication by any route, position, wound care, and other measures to relive pain and suffering. May use oxygen, suction and manual treatment of airway obstruction as needed for comfort.      07/22/21 0012           Code Status History     Date Active Date Inactive Code Status Order ID Comments User Context   07/21/2021 2247 07/22/2021 0012 DNR [id number]  Toy Baker, MD ED   05/13/2019 1817 05/15/2019 2036 Full  Code [id number]  [place], Peter M, MD Inpatient   05/13/2019 1817 05/13/2019 1817 Full Code [id number]  [place], Peter M, MD Inpatient         IV Access:   Peripheral IV   Procedures and diagnostic studies:   CT CHEST W CONTRAST  Result Date: 07/24/2021 CLINICAL DATA:  Thoracic aorta  disease, post-op; Aneurysm, pelvis or lower extremity. Aneurysm, pelvis or lower extremity. Severe symptomatic aortic stenosis. EXAM: CT ANGIOGRAPHY CHEST, ABDOMEN AND PELVIS TECHNIQUE: Multidetector CT imaging through the chest, abdomen and pelvis was performed using the standard protocol during bolus administration of intravenous contrast. Multiplanar reconstructed images and MIPs were obtained and reviewed to evaluate the vascular anatomy. CONTRAST:  174m OMNIPAQUE IOHEXOL 350 MG/ML SOLN COMPARISON:  07/18/2021 FINDINGS: CTA CHEST FINDINGS Cardiovascular: The thoracic aorta is of normal caliber, measuring 3.6 x 3.7 cm in its ascending segment, 2.9 x 2.6 cm within the arch just beyond the takeoff of the left subclavian artery, and 2.6 x 2.2 cm within its descending segment at the level of the left atrium. No intramural hematoma or dissection. Moderate calcified atherosclerotic plaque. Conventional arch vessel anatomic configuration. Less than 50% stenosis of the left subclavian artery at its origin. Extensive, irregular mixed atherosclerotic plaque at the right carotid bifurcation results in a greater than 90% stenosis of the proximal right internal carotid artery calcified atherosclerotic plaque at the left carotid bifurcation results in a less than 50% stenosis the proximal left internal carotid artery. Calcified atherosclerotic plaque results in a 50% stenosis of the dominant left vertebral artery at its origin. Mild cardiomegaly with left ventricular hypertrophy noted. Extensive atherosclerotic calcification of the aortic valve leaflets. The aortic valve is trileaflet. Moderate coronary artery calcification with probable stenting of the proximal right coronary artery. Moderate pericardial effusion is present, enlarged in size when compared to prior examination. Pericardial fluid appears slightly hyperdense suggesting proteinaceous or hemorrhagic fluid. No CT evidence of cardiac tamponade. The central  pulmonary arteries are of normal caliber. Mediastinum/Nodes: No enlarged mediastinal, hilar, or axillary lymph nodes. Thyroid gland, trachea, and esophagus demonstrate no significant findings. Lungs/Pleura: Small bilateral pleural effusions are present with compressive atelectasis of the a lower lobes bilaterally, left greater than right. There is cylindrical bronchiectasis and volume loss noted within the lingula and basilar right middle lobe. Mild peribronchial nodularity within these regions may reflect the sequela of remote inflammation or a chronic atypical infectious process as can be seen with mycobacterial or fungal infection. No confluent pulmonary infiltrate. No pneumothorax. No central obstructing lesion. Musculoskeletal: No acute bone abnormality. No lytic or blastic bone lesion. Review of the MIP images confirms the above findings. CTA ABDOMEN AND PELVIS FINDINGS VASCULAR Aorta: Normal caliber. No aneurysm or dissection. Moderate atherosclerotic calcification. No periaortic inflammatory change. Celiac: Less than 50% stenosis of the a celiac origin secondary to calcified atherosclerotic plaque. Distally widely patent. No aneurysm or dissection. SMA: Patent without evidence of aneurysm, dissection, vasculitis or significant stenosis. Renals: Single renal arteries bilaterally. Less than 50% stenosis of the origin of the right renal artery. Widely patent left renal artery. Both arteries demonstrate a a beaded appearance within there mid segments in keeping with changes of fibromuscular dysplasia. No aneurysm or dissection. IMA: 50-75% stenosis of the origin.  Distally widely patent. Inflow: Mild atherosclerotic calcification. No evidence of hemodynamically significant stenosis. No aneurysm or dissection. Common iliac arteries measure 8-9 mm in diameter bilaterally. External iliac arteries measure 6 mm in diameter bilaterally. Common femoral arteries measure 7 mm in diameter  bilaterally. Veins: No obvious  venous abnormality within the limitations of this arterial phase study. Review of the MIP images confirms the above findings. NON-VASCULAR Hepatobiliary: Geographic region of hyperenhancement within segment 2 of the liver may relate to underlying arterial portal shunting with a resultant transient hepatic attenuation difference (THAD). Similar region is seen within segment 4B of the liver. There are at least 3 arterial enhancing rounded lesions within segment 4 and segment 8 of the liver measuring up to 8 mm (image # 137/4) which are nonspecific but may represent small flash filled hemangioma. The liver is otherwise unremarkable. Gallbladder unremarkable. No intra or extrahepatic biliary ductal dilation. Pancreas: Unremarkable Spleen: Unremarkable Adrenals/Urinary Tract: The adrenal glands are unremarkable. The kidneys are normal in position. Moderate left renal asymmetric cortical atrophy. No enhancing intrarenal masses. No hydronephrosis. No intrarenal or ureteral calculi. The bladder is unremarkable. Stomach/Bowel: Stomach is within normal limits. Appendix appears normal. No evidence of bowel wall thickening, distention, or inflammatory changes. Lymphatic: No pathologic adenopathy within the abdomen and pelvis. Reproductive: Uterus and bilateral adnexa are unremarkable. Other: Surgical dressing noted within the right inguinal region. No abdominal wall hernia. Musculoskeletal: Degenerative changes are noted within the lumbar spine with grade 1 anterolisthesis of L4-5. No acute bone abnormality. No lytic or blastic bone lesion. Review of the MIP images confirms the above findings. IMPRESSION: No evidence of thoracoabdominal aortic aneurysm or dissection. Peripheral vascular disease with greater than 90% stenosis of the proximal right internal carotid artery. Plaque morphology appears irregular and may be better assessed with dedicated carotid artery Doppler sonography. 50% stenosis of the a dominant left vertebral  artery at its origin. 50-75% stenosis of the inferior mesenteric artery at its origin, though this is of questionable clinical significance given patency of the celiac axis and superior mesenteric artery. 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 Cardiomegaly with left ventricular hypertrophy. Extensive calcification of the aortic valve leaflets. Enlarging, moderate pericardial effusion without definite CT evidence of cardiac tamponade. Note, however, small bilateral pleural effusions have developed since prior examination and can be seen the setting of progressive cardiogenic failure. Pericardial fluid appears slightly hyperdense suggesting a proteinaceous or hemorrhagic component. Cylindrical bronchiectasis and volume loss within the lingula and basilar right middle lobe with associated peribronchial nodularity. This may reflect the sequela of remote inflammation or reflect changes of a chronic infectious process such as atypical mycobacterial or fungal pneumonia. Multiple subcentimeter arterially enhancing lesions within the liver possibly representing small flash filled hemangioma. These are not optimally characterized on this examination may be better assessed with dedicated MRI examination if clinically indicated. Aortic Atherosclerosis (ICD10-I70.0). Electronically Signed   By: Fidela Salisbury M.D.   On: 07/24/2021 21:16   CT ANGIO ABDOMEN W &/OR WO CONTRAST  Result Date: 07/24/2021 CLINICAL DATA:  Thoracic aorta disease, post-op; Aneurysm, pelvis or lower extremity. Aneurysm, pelvis or lower extremity. Severe symptomatic aortic stenosis. EXAM: CT ANGIOGRAPHY CHEST, ABDOMEN AND PELVIS TECHNIQUE: Multidetector CT imaging through the chest, abdomen and pelvis was performed using the standard protocol during bolus administration of intravenous contrast. Multiplanar reconstructed images and MIPs were obtained and reviewed to evaluate the vascular  anatomy. CONTRAST:  136m OMNIPAQUE IOHEXOL 350 MG/ML SOLN COMPARISON:  07/18/2021 FINDINGS: CTA CHEST FINDINGS Cardiovascular: The thoracic aorta is of normal caliber, measuring 3.6 x 3.7 cm in its ascending segment, 2.9 x 2.6 cm within the arch just beyond the takeoff of the left subclavian artery, and 2.6 x 2.2 cm within its  descending segment at the level of the left atrium. No intramural hematoma or dissection. Moderate calcified atherosclerotic plaque. Conventional arch vessel anatomic configuration. Less than 50% stenosis of the left subclavian artery at its origin. Extensive, irregular mixed atherosclerotic plaque at the right carotid bifurcation results in a greater than 90% stenosis of the proximal right internal carotid artery calcified atherosclerotic plaque at the left carotid bifurcation results in a less than 50% stenosis the proximal left internal carotid artery. Calcified atherosclerotic plaque results in a 50% stenosis of the dominant left vertebral artery at its origin. Mild cardiomegaly with left ventricular hypertrophy noted. Extensive atherosclerotic calcification of the aortic valve leaflets. The aortic valve is trileaflet. Moderate coronary artery calcification with probable stenting of the proximal right coronary artery. Moderate pericardial effusion is present, enlarged in size when compared to prior examination. Pericardial fluid appears slightly hyperdense suggesting proteinaceous or hemorrhagic fluid. No CT evidence of cardiac tamponade. The central pulmonary arteries are of normal caliber. Mediastinum/Nodes: No enlarged mediastinal, hilar, or axillary lymph nodes. Thyroid gland, trachea, and esophagus demonstrate no significant findings. Lungs/Pleura: Small bilateral pleural effusions are present with compressive atelectasis of the a lower lobes bilaterally, left greater than right. There is cylindrical bronchiectasis and volume loss noted within the lingula and basilar right middle  lobe. Mild peribronchial nodularity within these regions may reflect the sequela of remote inflammation or a chronic atypical infectious process as can be seen with mycobacterial or fungal infection. No confluent pulmonary infiltrate. No pneumothorax. No central obstructing lesion. Musculoskeletal: No acute bone abnormality. No lytic or blastic bone lesion. Review of the MIP images confirms the above findings. CTA ABDOMEN AND PELVIS FINDINGS VASCULAR Aorta: Normal caliber. No aneurysm or dissection. Moderate atherosclerotic calcification. No periaortic inflammatory change. Celiac: Less than 50% stenosis of the a celiac origin secondary to calcified atherosclerotic plaque. Distally widely patent. No aneurysm or dissection. SMA: Patent without evidence of aneurysm, dissection, vasculitis or significant stenosis. Renals: Single renal arteries bilaterally. Less than 50% stenosis of the origin of the right renal artery. Widely patent left renal artery. Both arteries demonstrate a a beaded appearance within there mid segments in keeping with changes of fibromuscular dysplasia. No aneurysm or dissection. IMA: 50-75% stenosis of the origin.  Distally widely patent. Inflow: Mild atherosclerotic calcification. No evidence of hemodynamically significant stenosis. No aneurysm or dissection. Common iliac arteries measure 8-9 mm in diameter bilaterally. External iliac arteries measure 6 mm in diameter bilaterally. Common femoral arteries measure 7 mm in diameter bilaterally. Veins: No obvious venous abnormality within the limitations of this arterial phase study. Review of the MIP images confirms the above findings. NON-VASCULAR Hepatobiliary: Geographic region of hyperenhancement within segment 2 of the liver may relate to underlying arterial portal shunting with a resultant transient hepatic attenuation difference (THAD). Similar region is seen within segment 4B of the liver. There are at least 3 arterial enhancing rounded  lesions within segment 4 and segment 8 of the liver measuring up to 8 mm (image # 137/4) which are nonspecific but may represent small flash filled hemangioma. The liver is otherwise unremarkable. Gallbladder unremarkable. No intra or extrahepatic biliary ductal dilation. Pancreas: Unremarkable Spleen: Unremarkable Adrenals/Urinary Tract: The adrenal glands are unremarkable. The kidneys are normal in position. Moderate left renal asymmetric cortical atrophy. No enhancing intrarenal masses. No hydronephrosis. No intrarenal or ureteral calculi. The bladder is unremarkable. Stomach/Bowel: Stomach is within normal limits. Appendix appears normal. No evidence of bowel wall thickening, distention, or inflammatory changes. Lymphatic: No pathologic adenopathy within  the abdomen and pelvis. Reproductive: Uterus and bilateral adnexa are unremarkable. Other: Surgical dressing noted within the right inguinal region. No abdominal wall hernia. Musculoskeletal: Degenerative changes are noted within the lumbar spine with grade 1 anterolisthesis of L4-5. No acute bone abnormality. No lytic or blastic bone lesion. Review of the MIP images confirms the above findings. IMPRESSION: No evidence of thoracoabdominal aortic aneurysm or dissection. Peripheral vascular disease with greater than 90% stenosis of the proximal right internal carotid artery. Plaque morphology appears irregular and may be better assessed with dedicated carotid artery Doppler sonography. 50% stenosis of the a dominant left vertebral artery at its origin. 50-75% stenosis of the inferior mesenteric artery at its origin, though this is of questionable clinical significance given patency of the celiac axis and superior mesenteric artery. 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 Cardiomegaly with left ventricular hypertrophy. Extensive calcification of the aortic valve leaflets. Enlarging, moderate pericardial  effusion without definite CT evidence of cardiac tamponade. Note, however, small bilateral pleural effusions have developed since prior examination and can be seen the setting of progressive cardiogenic failure. Pericardial fluid appears slightly hyperdense suggesting a proteinaceous or hemorrhagic component. Cylindrical bronchiectasis and volume loss within the lingula and basilar right middle lobe with associated peribronchial nodularity. This may reflect the sequela of remote inflammation or reflect changes of a chronic infectious process such as atypical mycobacterial or fungal pneumonia. Multiple subcentimeter arterially enhancing lesions within the liver possibly representing small flash filled hemangioma. These are not optimally characterized on this examination may be better assessed with dedicated MRI examination if clinically indicated. Aortic Atherosclerosis (ICD10-I70.0). Electronically Signed   By: Fidela Salisbury M.D.   On: 07/24/2021 21:16   CT CORONARY MORPH W/CTA COR W/SCORE W/CA W/CM &/OR WO/CM  Result Date: 07/25/2021 CLINICAL DATA:  Aortic Stenosis EXAM: Cardiac TAVR CT TECHNIQUE: The patient was scanned on a Siemens Force 829 slice scanner. A 120 kV retrospective scan was triggered in the ascending thoracic aorta at 140 HU's. Gantry rotation speed was 250 msecs and collimation was .6 mm. No beta blockade or nitro were given. The 3D data set was reconstructed in 5% intervals of the R-R cycle. Systolic and diastolic phases were analyzed on a dedicated work station using MPR, MIP and VRT modes. The patient received 80 cc of contrast. FINDINGS: Aortic Valve: Tri leaflet calcified score 2093 Aorta: Bovine arch no aneurysm moderate calcific atherosclerosis Sino-tubular Junction: 25 nn Ascending Thoracic Aorta: 35 mm Aortic Arch: 27 mm Descending Thoracic Aorta: 27 mm Sinus of Valsalva Measurements: Non-coronary: 29.3 mm Right - coronary: 28.6 mm Left -   coronary: 28 mm Coronary Artery Height above  Annulus: Left Main: 15 mm above annulus Right Coronary: 15.6 mm above annulus Virtual Basal Annulus Measurements: Average  diameter: 22.8 mm Perimeter: 74.9 mm Area: 407 mm2 AVA by planimetry 1.01 cm2 Coronary Arteries: Sufficient height above annulus for deployment Optimum Fluoroscopic Angle for Delivery: LAO 20 Caudal 19 degrees IMPRESSION: 1. Tri leaflet AV with calcium score 2093 and planimetered AVA 1.01 cm2 2.  Annular area of 407 mm2 suitable for a 23 mm Sapien 3 valve 3.  Coronary arteries sufficient height above annulus for deployment 4. Optimal angiographic angle for deployment LAO 20 Caudal 19 degrees 5.  Large circumferential pericardial effusion [place] Electronically Signed   By: Jenkins Rouge M.D.   On: 07/25/2021 06:57     Medical Consultants:   None.   Subjective:    Raziya Aveni has  not had a bowel movement is frustrated  Objective:    Vitals:   07/25/21 2049 07/25/21 2136 07/26/21 0027 07/26/21 0416  BP: (!) 172/75 (!) 183/78 130/79 (!) 169/83  Pulse: 87 81 66 67  Resp: _0 Temp: 97.7 F (36.5 C)  97.7 F (36.5 C) 97.8 F (36.6 C)  TempSrc: Oral  Oral Oral  SpO2: 95%  95% 95%  Weight:    58.8 kg  Height:       SpO2: 95 % O2 Flow Rate (L/min): 2 L/min   Intake/Output Summary (Last 24 hours) at 07/26/2021 0841 Last data filed at 07/25/2021 1526 Gross per 24 hour  Intake 40.08 ml  Output --  Net 40.08 ml    Filed Weights   07/24/21 0515 07/25/21 0500 07/26/21 0416  Weight: 58.3 kg 58.6 kg 58.8 kg    Exam: General exam: In no acute distress. Respiratory system: Good air movement and clear to auscultation. Cardiovascular system: S1 & S2 heard, RRR. No JVD. Gastrointestinal system: Abdomen is nondistended, soft and nontender.  Extremities: No pedal edema. Skin: No rashes, lesions or ulcers Psychiatry: Judgement and insight appear normal. Mood & affect appropriate.   Data Reviewed:    Labs: Basic Metabolic Panel: Recent Labs  Lab  07/21/21 1948 07/22/21 0022 07/22/21 0421 07/23/21 0745 07/23/21 5956 07/23/21 3875 07/23/21 0943 07/24/21 0119  NA 128*  --  131* 129* 128* 128* 128* 129*  K 4.2  --  3.9 4.1 3.8 3.9 3.8 4.1  CL 93*  --  93* 93*  --   --   --  93*  CO2 27  --  28 25  --   --   --  27  GLUCOSE 112*  --  109* 106*  --   --   --  101*  BUN 20  --  21 18  --   --   --  22  CREATININE 0.93  --  1.09* 0.95  --   --   --  1.00  CALCIUM 8.9  --  9.1 8.9  --   --   --  9.1  MG 2.1  --  2.2  --   --   --   --   --   PHOS  --  3.0 3.4  --   --   --   --   --     GFR Estimated Creatinine Clearance: 33.6 mL/min (by C-G formula based on SCr of 1 mg/dL). Liver Function Tests: Recent Labs  Lab 07/21/21 1948 07/22/21 0421  AST 106* 70*  ALT 121* 99*  ALKPHOS 205* 175*  BILITOT 0.8 0.9  PROT 7.1 6.6  ALBUMIN 3.5 3.2*    No results for input(s): LIPASE, AMYLASE in the last 168 hours. No results for input(s): AMMONIA in the last 168 hours. Coagulation profile Recent Labs  Lab 07/21/21 1948  INR 1.0    COVID-19 Labs  No results for input(s): DDIMER, FERRITIN, LDH, CRP in the last 72 hours.   Lab Results  Component Value Date   SARSCOV2NAA NEGATIVE 07/21/2021   [place] NEGATIVE 05/13/2019    CBC: Recent Labs  Lab 07/21/21 1948 07/22/21 0421 07/23/21 0937 07/23/21 0942 07/23/21 0943  WBC 8.4 7.5  --   --   --   NEUTROABS 5.8 4.7  --   --   --   HGB 11.5* 11.3* 10.5* 10.9* 10.5*  HCT 35.6* 33.6* 31.0* 32.0* 31.0*  MCV 91.8 90.8  --   --   --  PLT 267 275  --   --   --     Cardiac Enzymes: Recent Labs  Lab 07/22/21 0022  CKTOTAL 49    BNP (last 3 results) No results for input(s): PROBNP in the last 8760 hours. CBG: No results for input(s): GLUCAP in the last 168 hours. D-Dimer: No results for input(s): DDIMER in the last 72 hours.  Hgb A1c: No results for input(s): HGBA1C in the last 72 hours. Lipid Profile: No results for input(s): CHOL, HDL, LDLCALC, TRIG,  CHOLHDL, LDLDIRECT in the last 72 hours. Thyroid function studies: No results for input(s): TSH, T4TOTAL, T3FREE, THYROIDAB in the last 72 hours.  Invalid input(s): FREET3  Anemia work up: No results for input(s): VITAMINB12, FOLATE, FERRITIN, TIBC, IRON, RETICCTPCT in the last 72 hours. Sepsis Labs: Recent Labs  Lab 07/21/21 1948 07/22/21 0022 07/22/21 0421 07/22/21 2156  WBC 8.4  --  7.5  --   LATICACIDVEN  --  0.9  --  0.7    Microbiology Recent Results (from the past 240 hour(s))  Resp Panel by RT-PCR (Flu A&B, Covid) Nasopharyngeal Swab     Status: None   Collection Time: 07/21/21  7:48 PM   Specimen: Nasopharyngeal Swab; Nasopharyngeal(NP) swabs in vial transport medium  Result Value Ref Range Status   SARS Coronavirus 2 by RT PCR NEGATIVE NEGATIVE Final    Comment: (NOTE) SARS-CoV-2 target nucleic acids are NOT DETECTED.  The SARS-CoV-2 RNA is generally detectable in upper respiratory specimens during the acute phase of infection. The lowest concentration of SARS-CoV-2 viral copies this assay can detect is 138 copies/mL. A negative result does not preclude SARS-Cov-2 infection and should not be used as the sole basis for treatment or other patient management decisions. A negative result may occur with  improper specimen collection/handling, submission of specimen other than nasopharyngeal swab, presence of viral mutation(s) within the areas targeted by this assay, and inadequate number of viral copies(<138 copies/mL). A negative result must be combined with clinical observations, patient history, and epidemiological information. The expected result is Negative.  Fact Sheet for Patients:  EntrepreneurPulse.com.au  Fact Sheet for Healthcare Providers:  IncredibleEmployment.be  This test is no t yet approved or cleared by the [place] FDA and  has been authorized for detection and/or diagnosis of SARS-CoV-2 by FDA under an  Emergency Use Authorization (EUA). This EUA will remain  in effect (meaning this test can be used) for the duration of the COVID-19 declaration under Section 564(b)(1) of the Act, 21 U.S.C.section 360bbb-3(b)(1), unless the authorization is terminated  or revoked sooner.       Influenza A by PCR NEGATIVE NEGATIVE Final   Influenza B by PCR NEGATIVE NEGATIVE Final    Comment: (NOTE) The Xpert Xpress SARS-CoV-2/FLU/RSV plus assay is intended as an aid in the diagnosis of influenza from Nasopharyngeal swab specimens and should not be used as a sole basis for treatment. Nasal washings and aspirates are unacceptable for Xpert Xpress SARS-CoV-2/FLU/RSV testing.  Fact Sheet for Patients: EntrepreneurPulse.com.au  Fact Sheet for Healthcare Providers: IncredibleEmployment.be  This test is not yet approved or cleared by the [place] FDA and has been authorized for detection and/or diagnosis of SARS-CoV-2 by FDA under an Emergency Use Authorization (EUA). This EUA will remain in effect (meaning this test can be used) for the duration of the COVID-19 declaration under Section 564(b)(1) of the Act, 21 U.S.C. section 360bbb-3(b)(1), unless the authorization is terminated or revoked.  Performed at [facility], [place]  [address], [place], [place] [address]      Medications:    aspirin EC  81 mg Oral Daily   azithromycin  250 mg Oral Daily   cholecalciferol  1,000 Units Oral Daily   docusate sodium  100 mg Oral Q breakfast   escitalopram  5 mg Oral Daily   lidocaine  1 patch Transdermal Q24H   lisinopril  25 mg Oral Daily   loratadine  10 mg Oral Daily   metoprolol tartrate  25 mg Oral BID   montelukast  10 mg Oral QPC supper   pantoprazole  40 mg Oral Daily   sodium chloride flush  3 mL Intravenous Q12H   Continuous Infusions:  sodium chloride     diltiazem (CARDIZEM) infusion 5 mg/hr (07/25/21 1658)      LOS: 5 days    Charlynne Cousins  [facility]  07/26/2021, 8:41 AM

## 2021-07-26 NOTE — Progress Notes (Signed)
Notified by CCMD patient  converted back to sinus. MD on call notified. EKG obtained.

## 2021-07-26 NOTE — Care Management Important Message (Signed)
Important Message  Patient Details  Name: Barbara Mcconnell MRN: [id number] Date of Birth: 20-Mar-1935   Medicare Important Message Given:  Yes     Sherilyn Banker 07/26/2021, 2:23 PM

## 2021-07-26 NOTE — Progress Notes (Signed)
TR[facility] night cross cover note:  I was notified by RN that this patient has converted into rate controlled atrial fibrillation, with associated heart rates in the 90s to low 100s bpm.  Normotensive, with most recent blood pressure 133/85.  She has been asymptomatic since conversion to atrial fibrillation.  Currently unclear to me as she has any known prior history of atrial fibrillation.    Babs Bertin, DO Hospitalist

## 2021-07-27 ENCOUNTER — Other Ambulatory Visit (HOSPITAL_COMMUNITY): Payer: Medicare HMO

## 2021-07-27 DIAGNOSIS — I739 Peripheral vascular disease, unspecified: Secondary | ICD-10-CM | POA: Diagnosis not present

## 2021-07-27 DIAGNOSIS — I2510 Atherosclerotic heart disease of native coronary artery without angina pectoris: Secondary | ICD-10-CM | POA: Diagnosis not present

## 2021-07-27 DIAGNOSIS — I6529 Occlusion and stenosis of unspecified carotid artery: Secondary | ICD-10-CM

## 2021-07-27 DIAGNOSIS — I350 Nonrheumatic aortic (valve) stenosis: Secondary | ICD-10-CM | POA: Diagnosis not present

## 2021-07-27 DIAGNOSIS — I509 Heart failure, unspecified: Secondary | ICD-10-CM | POA: Diagnosis not present

## 2021-07-27 DIAGNOSIS — I3139 Other pericardial effusion (noninflammatory): Secondary | ICD-10-CM | POA: Diagnosis not present

## 2021-07-27 MED ORDER — GLYCERIN (LAXATIVE) 2 G RE SUPP
1.0000 | Freq: Every day | RECTAL | Status: DC | PRN
  Filled 2021-07-27: qty 1

## 2021-07-27 MED ORDER — GLYCERIN (LAXATIVE) 2.1 G RE SUPP
1.0000 | Freq: Every day | RECTAL | Status: DC | PRN
  Filled 2021-07-27: qty 1

## 2021-07-27 MED ORDER — DILTIAZEM HCL ER COATED BEADS 120 MG PO CP24
120.0000 mg | ORAL_CAPSULE | Freq: Every day | ORAL | Status: DC
  Administered 2021-07-27 – 2021-07-30 (×4): 120 mg via ORAL
  Filled 2021-07-27 (×4): qty 1

## 2021-07-27 NOTE — Progress Notes (Signed)
Progress Note  Patient Name: Barbara Mcconnell Date of Encounter: 07/27/2021  [facility] Cardiologist: Peter Martinique, MD   Subjective   No CP or dyspnea  Inpatient Medications    Scheduled Meds:  aspirin EC  81 mg Oral Daily   azithromycin  250 mg Oral Daily   cholecalciferol  1,000 Units Oral Daily   diltiazem  30 mg Oral Q6H   docusate sodium  100 mg Oral Q breakfast   escitalopram  5 mg Oral Daily   lidocaine  1 patch Transdermal Q24H   lisinopril  25 mg Oral Daily   loratadine  10 mg Oral Daily   metoprolol tartrate  25 mg Oral BID   montelukast  10 mg Oral QPC supper   pantoprazole  40 mg Oral Daily   polyethylene glycol  17 g Oral BID   sodium chloride flush  3 mL Intravenous Q12H   sodium chloride flush  3 mL Intravenous Q12H   sorbitol, milk of mag, mineral oil, glycerin (SMOG) enema  400 mL Rectal Once   Continuous Infusions:  sodium chloride     sodium chloride     sodium chloride     PRN Meds: sodium chloride, sodium chloride, acetaminophen, acetaminophen, albuterol, ondansetron (ZOFRAN) IV, sodium chloride flush, sodium chloride flush   Vital Signs    Vitals:   07/26/21 1327 07/26/21 2034 07/27/21 0500 07/27/21 0632  BP:  (!) 157/78  (!) 163/81  Pulse:  90  95  Resp:  18  18  Temp:  97.7 F (36.5 C)  97.8 F (36.6 C)  TempSrc:  Oral  Oral  SpO2: 98% 98%  98%  Weight:   58.7 kg   Height:       No intake or output data in the 24 hours ending 07/27/21 0734 Last 3 Weights 07/27/2021 07/26/2021 07/25/2021  Weight (lbs) 129 lb 8 oz 129 lb 9.6 oz 129 lb 3.2 oz  Weight (kg) 58.741 kg 58.786 kg 58.605 kg      Telemetry    Sinus with rare PVC- Personally Reviewed  Physical Exam   GEN: No acute distress.   Neck: No JVD Cardiac: RRR, 2/6 systolic murmur Respiratory: Clear to auscultation bilaterally. GI: Soft, nontender, non-distended  MS: trace edema Neuro:  Nonfocal  Psych: Normal affect   Labs    High Sensitivity Troponin:   Recent Labs  Lab  06/30/21 1532 07/21/21 1948 07/21/21 2152 07/22/21 0022 07/22/21 0421  TROPONINIHS 13 63* 56* 51* 48*     Chemistry Recent Labs  Lab 07/21/21 1948 07/22/21 0421 07/23/21 0745 07/23/21 0937 07/23/21 0942 07/23/21 0943 07/24/21 0119  NA 128* 131* 129*   < > 128* 128* 129*  K 4.2 3.9 4.1   < > 3.9 3.8 4.1  CL 93* 93* 93*  --   --   --  93*  CO2 27 28 25   --   --   --  27  GLUCOSE 112* 109* 106*  --   --   --  101*  BUN 20 21 18   --   --   --  22  CREATININE 0.93 1.09* 0.95  --   --   --  1.00  CALCIUM 8.9 9.1 8.9  --   --   --  9.1  MG 2.1 2.2  --   --   --   --   --   PROT 7.1 6.6  --   --   --   --   --  ALBUMIN 3.5 3.2*  --   --   --   --   --   AST 106* 70*  --   --   --   --   --   ALT 121* 99*  --   --   --   --   --   ALKPHOS 205* 175*  --   --   --   --   --   BILITOT 0.8 0.9  --   --   --   --   --   GFRNONAA >60 50* 59*  --   --   --  56*  ANIONGAP 8 10 11   --   --   --  9   < > = values in this interval not displayed.     Hematology Recent Labs  Lab 07/21/21 1948 07/22/21 0421 07/23/21 [id number] 07/23/21 0942 07/23/21 0943  WBC 8.4 7.5  --   --   --   RBC 3.88 3.70*  --   --   --   HGB 11.5* 11.3* 10.5* 10.9* 10.5*  HCT 35.6* 33.6* 31.0* 32.0* 31.0*  MCV 91.8 90.8  --   --   --   MCH 29.6 30.5  --   --   --   MCHC 32.3 33.6  --   --   --   RDW 13.4 13.4  --   --   --   PLT 267 275  --   --   --    Thyroid  Recent Labs  Lab 07/22/21 0421  TSH 1.472    BNP Recent Labs  Lab 07/21/21 1948  BNP 201.8*    DDimer  Recent Labs  Lab 07/21/21 1948  DDIMER 1.58*   Echo   1. Left ventricular ejection fraction, by estimation, is 60 to 65%. The  left ventricle has normal function. The left ventricle has no regional  wall motion abnormalities. There is moderate concentric left ventricular  hypertrophy. Left ventricular  diastolic parameters are consistent with Grade I diastolic dysfunction  (impaired relaxation). Elevated left ventricular  end-diastolic pressure.   2. Right ventricular systolic function is normal. The right ventricular  size is normal. There is normal pulmonary artery systolic pressure.   3. Left atrial size was severely dilated.   4. Pericardial effusion measuring 1.5 cm. Increased compared with 09/2019.  . Moderate pericardial effusion. The pericardial effusion is  circumferential. There is no evidence of cardiac tamponade.   5. The mitral valve is degenerative. Trivial mitral valve regurgitation.  No evidence of mitral stenosis. Moderate mitral annular calcification.   6. AoV not well visualized. Appears calcified. Vmax 3.4 m/s, MG 33 mmHG,  AVA 1.38 cm2, DI 0.29. Moderate aortic stenosis. Mean gradient has  increased from 20 mmHg 09/2019 to 33 mmHg now. The aortic valve is  tricuspid. There is moderate calcification of  the aortic valve. There is moderate thickening of the aortic valve. Aortic  valve regurgitation is mild. Moderate aortic valve stenosis. Aortic valve  area, by VTI measures 1.38 cm. Aortic valve mean gradient measures 33.0  mmHg. Aortic valve Vmax measures   3.41 m/s.   7. Aortic dilatation noted. There is mild dilatation of the ascending  aorta, measuring 37 mm.   8. The inferior vena cava is dilated in size with >50% respiratory  variability, suggesting right atrial pressure of 8 mmHg.   Cath    Prox RCA lesion is 50% stenosed.   Prox Cx to Mid Cx  lesion is 25% stenosed.   Prox LAD to Mid LAD lesion is 25% stenosed.   Hemodynamic findings consistent with pulmonary hypertension.   Stable, mild to moderate coronary artery disease.  No indication for PCI.   Unable to cross aortic valve, consistent with worsening noted on echocardiogram.  Continue plans for TAVR w/u.  CTA IMPRESSION: 1. Tri leaflet AV with calcium score 2093 and planimetered AVA 1.01 cm2   2.  Annular area of 407 mm2 suitable for a 23 mm Sapien 3 valve   3.  Coronary arteries sufficient height above annulus for  deployment   4. Optimal angiographic angle for deployment LAO 20 Caudal 19 degrees   5.  Large circumferential pericardial effusion  Patient Profile     87 y.o. female with PMH of stress induced cardiomyopathy (LV function normalized), moderate to severe AS, pericardial effusion, PVD with CHF.   Assessment & Plan    1 large pericardial effusion noted on CT scan-plan is for pericardiocentesis today per Dr. Johnsie Cancel.  2 aortic stenosis-we will need surgical consultation as an outpatient and plan is ultimately to proceed with TAVR.  3 carotid artery disease-we will need to arrange carotid Dopplers as she has significant right internal carotid artery disease on CTA.  This will be arranged as an outpatient.  4 coronary artery disease-nonobstructive on catheterization.  Continue aspirin.  Statin on hold due to elevated liver functions.  5 paroxysmal atrial fibrillation-patient remains in sinus rhythm on telemetry.  We will continue metoprolol.  Change Cardizem CD to 120 mg daily.  We will not add DOAC at this point as she is scheduled to have pericardiocentesis.  There is also the risk of hemorrhagic transformation of pericardial effusion.  6 chronic diastolic congestive heart failure-she appears to be euvolemic on examination.  7 hypertension-blood pressure mildly elevated.  We will follow and adjust medications as needed.  8 bronchiectasis-Per primary service.  For questions or updates, please contact [place] Please consult [email or web address] for contact info under        Signed, Kirk Ruths, MD  07/27/2021, 7:34 AM

## 2021-07-27 NOTE — TOC Initial Note (Signed)
Transition of Care [facility]) - Initial/Assessment Note    Patient Details  Name: Barbara Mcconnell MRN: [id number] Date of Birth: [birth year]  Transition of Care [facility] CM/SW Contact:    Bethena Roys, RN Phone Number: 07/27/2021, 11:36 AM  Clinical Narrative: Case Manager spoke with patient and daughter yesterday regarding disposition needs. Daughter had several questions regarding home health and personal care services that would be of assistance to the patient. Case Manager discussed home health services and that the services are a three week program. We discussed two agencies that would likely be able to service the patient and her needs. [facility] is able to accept the patient; however, [facility] is not in network and is unable to service the patient. Case Manager did check with [facility] to see if they can service the patient and the patient is not in network with a [facility] Provider; therefore [facility] cannot follow the patient in the community. Case Manager did discuss personal care services with the patient and daughter and brochures were left in the room for the daughter to look over. Daughter is aware that the personal care services is an out of pocket fee. No home health is arranged at this time-following to see when the TAVR will take place. Case Manager will continue to follow for additional disposition needs.    Expected Discharge Plan: [place] Barriers to Discharge: Continued Medical Work up   Patient Goals and CMS Choice Patient states their goals for this hospitalization and ongoing recovery are:: to return home      Expected Discharge Plan and Services Expected Discharge Plan: [place] In-house Referral: NA Discharge Planning Services: CM Consult Post Acute Care Choice: [place] arrangements for the past 2 months: [place] (town home-one level)                           HH Arranged: PT, OT, Nurse's Aide           Prior Living Arrangements/Services Living arrangements for the past 2 months: [place] (town home-one level) Lives with:: Self (has support of daughter) Patient language and need for interpreter reviewed:: Yes        Need for Family Participation in Patient Care: Yes (Comment) Care giver support system in place?: Yes (comment)   Criminal Activity/Legal Involvement Pertinent to Current Situation/Hospitalization: No - Comment as needed  Activities of Daily Living Home Assistive Devices/Equipment: None ADL Screening (condition at time of admission) Patient's cognitive ability adequate to safely complete daily activities?: Yes Is the patient deaf or have difficulty hearing?: No Does the patient have difficulty seeing, even when wearing glasses/contacts?: No Does the patient have difficulty concentrating, remembering, or making decisions?: No Patient able to express need for assistance with ADLs?: Yes Does the patient have difficulty dressing or bathing?: No Independently performs ADLs?: Yes (appropriate for developmental age) Does the patient have difficulty walking or climbing stairs?: No Weakness of Legs: None Weakness of Arms/Hands: None  Permission Sought/Granted Permission sought to share information with : Family Supports, Customer service manager, Case Manager                Emotional Assessment Appearance:: Appears stated age Attitude/Demeanor/Rapport: Engaged Affect (typically observed): Appropriate Orientation: : Oriented to Situation, Oriented to Place, Oriented to Self, Oriented to  Time Alcohol / Substance Use: Not Applicable Psych Involvement: No (comment)  Admission diagnosis:  Shortness of breath [R06.02]  Pericardial effusion [I31.39] Elevated LFTs [R79.89] Exercise intolerance [R68.89] Patient Active Problem List   Diagnosis Date Noted   Fluid overload 07/22/2021   Bronchiectasis with acute exacerbation ([place]) 07/22/2021   Other  cirrhosis of liver ([place]) 07/22/2021   Pericardial effusion 07/21/2021   Hypertension    Aortic stenosis 05/31/2019   Renal insufficiency 05/31/2019   CAD (coronary artery disease) 05/31/2019   Non-ST elevation (NSTEMI) myocardial infarction ([place]) 05/13/2019   Essential hypertension 05/13/2019   Dyslipidemia, goal LDL below 70 05/13/2019   Carotid arterial disease ([place]) 05/13/2019   Takotsubo cardiomyopathy    PCP:  Kristopher Glee., MD Pharmacy:   [facility] - [place], [place] [address] Phone: [phone] Fax: [facility] [id number] - [place], [address] [place] [address] Phone: [phone] Fax: [facility] [phone] - [place], [place] - [address] AT [place] [address] [place] [address] Phone: [phone] Fax: [phone]    Readmission Risk Interventions No flowsheet data found.

## 2021-07-27 NOTE — H&P (View-Only) (Signed)
Progress Note  Patient Name: Barbara Mcconnell Date of Encounter: 07/27/2021  [facility] Cardiologist: Peter Martinique, MD   Subjective   No CP or dyspnea  Inpatient Medications    Scheduled Meds:  aspirin EC  81 mg Oral Daily   azithromycin  250 mg Oral Daily   cholecalciferol  1,000 Units Oral Daily   diltiazem  30 mg Oral Q6H   docusate sodium  100 mg Oral Q breakfast   escitalopram  5 mg Oral Daily   lidocaine  1 patch Transdermal Q24H   lisinopril  25 mg Oral Daily   loratadine  10 mg Oral Daily   metoprolol tartrate  25 mg Oral BID   montelukast  10 mg Oral QPC supper   pantoprazole  40 mg Oral Daily   polyethylene glycol  17 g Oral BID   sodium chloride flush  3 mL Intravenous Q12H   sodium chloride flush  3 mL Intravenous Q12H   sorbitol, milk of mag, mineral oil, glycerin (SMOG) enema  400 mL Rectal Once   Continuous Infusions:  sodium chloride     sodium chloride     sodium chloride     PRN Meds: sodium chloride, sodium chloride, acetaminophen, acetaminophen, albuterol, ondansetron (ZOFRAN) IV, sodium chloride flush, sodium chloride flush   Vital Signs    Vitals:   07/26/21 1327 07/26/21 2034 07/27/21 0500 07/27/21 0632  BP:  (!) 157/78  (!) 163/81  Pulse:  90  95  Resp:  18  18  Temp:  97.7 F (36.5 C)  97.8 F (36.6 C)  TempSrc:  Oral  Oral  SpO2: 98% 98%  98%  Weight:   58.7 kg   Height:       No intake or output data in the 24 hours ending 07/27/21 0734 Last 3 Weights 07/27/2021 07/26/2021 07/25/2021  Weight (lbs) 129 lb 8 oz 129 lb 9.6 oz 129 lb 3.2 oz  Weight (kg) 58.741 kg 58.786 kg 58.605 kg      Telemetry    Sinus with rare PVC- Personally Reviewed  Physical Exam   GEN: No acute distress.   Neck: No JVD Cardiac: RRR, 2/6 systolic murmur Respiratory: Clear to auscultation bilaterally. GI: Soft, nontender, non-distended  MS: trace edema Neuro:  Nonfocal  Psych: Normal affect   Labs    High Sensitivity Troponin:   Recent Labs  Lab  06/30/21 1532 07/21/21 1948 07/21/21 2152 07/22/21 0022 07/22/21 0421  TROPONINIHS 13 63* 56* 51* 48*     Chemistry Recent Labs  Lab 07/21/21 1948 07/22/21 0421 07/23/21 0745 07/23/21 0937 07/23/21 0942 07/23/21 0943 07/24/21 0119  NA 128* 131* 129*   < > 128* 128* 129*  K 4.2 3.9 4.1   < > 3.9 3.8 4.1  CL 93* 93* 93*  --   --   --  93*  CO2 27 28 25   --   --   --  27  GLUCOSE 112* 109* 106*  --   --   --  101*  BUN 20 21 18   --   --   --  22  CREATININE 0.93 1.09* 0.95  --   --   --  1.00  CALCIUM 8.9 9.1 8.9  --   --   --  9.1  MG 2.1 2.2  --   --   --   --   --   PROT 7.1 6.6  --   --   --   --   --  ALBUMIN 3.5 3.2*  --   --   --   --   --   AST 106* 70*  --   --   --   --   --   ALT 121* 99*  --   --   --   --   --   ALKPHOS 205* 175*  --   --   --   --   --   BILITOT 0.8 0.9  --   --   --   --   --   GFRNONAA >60 50* 59*  --   --   --  56*  ANIONGAP 8 10 11   --   --   --  9   < > = values in this interval not displayed.     Hematology Recent Labs  Lab 07/21/21 1948 07/22/21 0421 07/23/21 [id number] 07/23/21 0942 07/23/21 0943  WBC 8.4 7.5  --   --   --   RBC 3.88 3.70*  --   --   --   HGB 11.5* 11.3* 10.5* 10.9* 10.5*  HCT 35.6* 33.6* 31.0* 32.0* 31.0*  MCV 91.8 90.8  --   --   --   MCH 29.6 30.5  --   --   --   MCHC 32.3 33.6  --   --   --   RDW 13.4 13.4  --   --   --   PLT 267 275  --   --   --    Thyroid  Recent Labs  Lab 07/22/21 0421  TSH 1.472    BNP Recent Labs  Lab 07/21/21 1948  BNP 201.8*    DDimer  Recent Labs  Lab 07/21/21 1948  DDIMER 1.58*   Echo   1. Left ventricular ejection fraction, by estimation, is 60 to 65%. The  left ventricle has normal function. The left ventricle has no regional  wall motion abnormalities. There is moderate concentric left ventricular  hypertrophy. Left ventricular  diastolic parameters are consistent with Grade I diastolic dysfunction  (impaired relaxation). Elevated left ventricular  end-diastolic pressure.   2. Right ventricular systolic function is normal. The right ventricular  size is normal. There is normal pulmonary artery systolic pressure.   3. Left atrial size was severely dilated.   4. Pericardial effusion measuring 1.5 cm. Increased compared with 09/2019.  . Moderate pericardial effusion. The pericardial effusion is  circumferential. There is no evidence of cardiac tamponade.   5. The mitral valve is degenerative. Trivial mitral valve regurgitation.  No evidence of mitral stenosis. Moderate mitral annular calcification.   6. AoV not well visualized. Appears calcified. Vmax 3.4 m/s, MG 33 mmHG,  AVA 1.38 cm2, DI 0.29. Moderate aortic stenosis. Mean gradient has  increased from 20 mmHg 09/2019 to 33 mmHg now. The aortic valve is  tricuspid. There is moderate calcification of  the aortic valve. There is moderate thickening of the aortic valve. Aortic  valve regurgitation is mild. Moderate aortic valve stenosis. Aortic valve  area, by VTI measures 1.38 cm. Aortic valve mean gradient measures 33.0  mmHg. Aortic valve Vmax measures   3.41 m/s.   7. Aortic dilatation noted. There is mild dilatation of the ascending  aorta, measuring 37 mm.   8. The inferior vena cava is dilated in size with >50% respiratory  variability, suggesting right atrial pressure of 8 mmHg.   Cath    Prox RCA lesion is 50% stenosed.   Prox Cx to Mid Cx  lesion is 25% stenosed.   Prox LAD to Mid LAD lesion is 25% stenosed.   Hemodynamic findings consistent with pulmonary hypertension.   Stable, mild to moderate coronary artery disease.  No indication for PCI.   Unable to cross aortic valve, consistent with worsening noted on echocardiogram.  Continue plans for TAVR w/u.  CTA IMPRESSION: 1. Tri leaflet AV with calcium score 2093 and planimetered AVA 1.01 cm2   2.  Annular area of 407 mm2 suitable for a 23 mm Sapien 3 valve   3.  Coronary arteries sufficient height above annulus for  deployment   4. Optimal angiographic angle for deployment LAO 20 Caudal 19 degrees   5.  Large circumferential pericardial effusion  Patient Profile     87 y.o. female with PMH of stress induced cardiomyopathy (LV function normalized), moderate to severe AS, pericardial effusion, PVD with CHF.   Assessment & Plan    1 large pericardial effusion noted on CT scan-plan is for pericardiocentesis today per Dr. Johnsie Cancel.  2 aortic stenosis-we will need surgical consultation as an outpatient and plan is ultimately to proceed with TAVR.  3 carotid artery disease-we will need to arrange carotid Dopplers as she has significant right internal carotid artery disease on CTA.  This will be arranged as an outpatient.  4 coronary artery disease-nonobstructive on catheterization.  Continue aspirin.  Statin on hold due to elevated liver functions.  5 paroxysmal atrial fibrillation-patient remains in sinus rhythm on telemetry.  We will continue metoprolol.  Change Cardizem CD to 120 mg daily.  We will not add DOAC at this point as she is scheduled to have pericardiocentesis.  There is also the risk of hemorrhagic transformation of pericardial effusion.  6 chronic diastolic congestive heart failure-she appears to be euvolemic on examination.  7 hypertension-blood pressure mildly elevated.  We will follow and adjust medications as needed.  8 bronchiectasis-Per primary service.  For questions or updates, please contact [place] Please consult [email or web address] for contact info under        Signed, Kirk Ruths, MD  07/27/2021, 7:34 AM

## 2021-07-27 NOTE — Progress Notes (Signed)
Physical Therapy Treatment Patient Details Name: Barbara Mcconnell MRN: [id number] DOB: 1935-07-24 Today's Date: 07/27/2021   History of Present Illness 87 y.o. female sent to the ED by PCP on 12/28 to ED for shortness of breath. 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    PT Comments    Patient agreeable to exercises for balance and LE strengthening today but declined out of room activity. Patient completed seated exercises for LE strengthening and sitting and standing and balance activity for bil LE's with intermittent UE support and min guard for safety. Anticipate pt will continue to progress well with mobility, recommendation for HHPT remains appropriate with transition to OPPT or back to silver sneakers program as pt was participating in exercise classes 3x/week PTA. Acute PT will continue to follow and progress as able.    Recommendations for follow up therapy are one component of a multi-disciplinary discharge planning process, led by the attending physician.  Recommendations may be updated based on patient status, additional functional criteria and insurance authorization.  Follow Up Recommendations  Home health PT (vs OPPT to return to silver sneakers exercise classes)     Assistance Recommended at Discharge Intermittent Supervision/Assistance  Patient can return home with the following     Equipment Recommendations  None recommended by PT    Recommendations for Other Services       Precautions / Restrictions Precautions Precautions: Fall Precaution Comments: monitor HR and sats Restrictions Weight Bearing Restrictions: No     Mobility  Bed Mobility               General bed mobility comments: OOB to standard chair at start of  session    Transfers Overall transfer level: Independent Equipment used: None Transfers: Sit to/from Stand Sit to Stand: Min guard           General transfer comment: no cues needed for technique, guarding for safety and pt taking extra time. completed with and without UE use.    Ambulation/Gait Ambulation/Gait assistance: Min guard Gait Distance (Feet): 20 Feet Assistive device: None;1 person hand held assist Gait Pattern/deviations: Step-through pattern;Decreased stride length;Trunk flexed;Shuffle Gait velocity: decr     General Gait Details: pt amb short bout in room while completing exercises. decliend gait out of room. no LOB noted, pt with reduced foot clearance at time and shuffled steps. posture slightly flexed when reaching for external support of chair back or counter.   Stairs             Wheelchair Mobility    Modified Rankin (Stroke Patients Only)       Balance Overall balance assessment: Needs assistance Sitting-balance support: Feet supported Sitting balance-Leahy Scale: Good     Standing balance support: No upper extremity supported;During functional activity Standing balance-Leahy Scale: Good Standing balance comment: occasional reaching for external support (furniture surfing)                            Cognition Arousal/Alertness: Awake/alert Behavior During Therapy: WFL for tasks assessed/performed Overall Cognitive Status: Within Functional Limits for tasks assessed  Exercises Other Exercises Other Exercises: 10 reps Sit<>Stand, no UE use for power up. Other Exercises: 10 reps bil LE marching with counter support Other Exercises: 15 reps heel raises with counter support Other Exercises: 10 reps Bil LE LAQ Other Exercises: 3 reps bil LE SLS with inermittent counter support (5 seconds)    General Comments        Pertinent Vitals/Pain Pain Assessment:  No/denies pain    Home Living                          Prior Function            PT Goals (current goals can now be found in the care plan section) Acute Rehab PT Goals Patient Stated Goal: to go home alone and feel better PT Goal Formulation: With patient Time For Goal Achievement: 08/06/21 Potential to Achieve Goals: Good Progress towards PT goals: Progressing toward goals    Frequency    Min 3X/week      PT Plan Current plan remains appropriate    Co-evaluation              AM-PAC PT "6 Clicks" Mobility   Outcome Measure  Help needed turning from your back to your side while in a flat bed without using bedrails?: None Help needed moving from lying on your back to sitting on the side of a flat bed without using bedrails?: None Help needed moving to and from a bed to a chair (including a wheelchair)?: A Little Help needed standing up from a chair using your arms (e.g., wheelchair or bedside chair)?: None Help needed to walk in hospital room?: A Little Help needed climbing 3-5 steps with a railing? : A Little 6 Click Score: 21    End of Session Equipment Utilized During Treatment: Gait belt Activity Tolerance: Patient tolerated treatment well Patient left: in chair;with call bell/phone within reach Nurse Communication: Mobility status PT Visit Diagnosis: Unsteadiness on feet (R26.81);Muscle weakness (generalized) (M62.81)     Time: 8527-7824 PT Time Calculation (min) (ACUTE ONLY): 20 min  Charges:  $Therapeutic Exercise: 8-22 mins                     Verner Mould, DPT Acute Rehabilitation Services Office [phone] Pager [phone]    Barbara Mcconnell 07/27/2021, 12:53 PM

## 2021-07-27 NOTE — Plan of Care (Signed)

## 2021-07-27 NOTE — Progress Notes (Signed)
[facility] PROGRESS NOTE    Progress Note  Barbara Mcconnell  UVO:[id number] DOB: 1934-10-01 DOA: 07/21/2021 PCP: Kristopher Glee., MD     Brief Narrative:   Barbara Mcconnell is an 87 y.o. female past medical history significant for MAC, essential hypertension, hyperlipidemia, Takotsubo cardiomyopathy, recently seen by pulmonary as an outpatient started on azithromycin for MAC infection sent to the ED by PCP ED for shortness of breath CT angio of the chest was done on Christmas Day showed no evidence of PE but it did show scattered tree-in-bud nodular density in lungs bedside echo showed worsening pericardial effusion cardiology was consulted recommended transfer to [facility]. 2D echo was done that showed a mean aortic gradient on 2D echo 20 mmHg and a moderate pericardial effusion which is circumferential.  Cardiac cath showed proximal RCA 50% stenosis stable mild to moderate coronary artery disease no indication for PCI   Assessment/Plan:   Shortness of breath multifactorial due to pericardial effusion and moderate to severe aortic stenosis and pericardial effusion: She is currently on lisinopril and metoprolol. Cardiology on board CT of the coronaries with a mean gradient of 33 mmHg with severe calcium score. They recommended further evaluation with TAVR's team, pericardiocentesis today 07/27/2021  Acute on chronic diastolic heart failure:  Lasix on hold, further management per cardiology. Currently on metoprolol and lisinopril, continue Cardizem CD Her blood pressure is well controlled.    Carotid artery stenosis: Will need carotid Doppler to be arranged as an outpatient by cardiology.  New onset paroxysmal atrial fibrillation: Now in sinus rhythm rate control. Chads Vascor greater than 2, will discuss with cardiology to start a as an outpatient DOAC.  Essential hypertension Blood pressure is well, see above for further details.  Hypervolemic hyponatremia: Sodium was  129.  Bronchiectasis with acute exacerbation: With a history of MAC continue azithromycin.  Transaminitis/cirrhosis: Right upper quadrant ultrasound revealed early cirrhosis.  Etiology unclear, hepatitis A was positive statins were held. Discussed with GI they recommended follow-up as an outpatient.  Nonobstructive CAD: Elevated tropes, 50% stenosis of RCA in 2020.  Currently chest pain-free. Continue  aspirin  Peripheral vascular disease: CT of the chest showed right carotid artery has 90% stenosis she remains asymptomatic. 50% stenosis of the dominant left vertebral artery 50%-75% stenosis of the inferior mesenteric artery at its origin    DVT prophylaxis: lovenox Family Communication:none Status is: Inpatient  Remains inpatient appropriate because: Shortness of breath        Code Status:     Code Status Orders  (From admission, onward)           Start     Ordered   07/22/21 0013  Do not attempt resuscitation (DNR)  Continuous       Question Answer Comment  In the event of cardiac or respiratory ARREST Do not call a code blue   In the event of cardiac or respiratory ARREST Do not perform Intubation, CPR, defibrillation or ACLS   In the event of cardiac or respiratory ARREST Use medication by any route, position, wound care, and other measures to relive pain and suffering. May use oxygen, suction and manual treatment of airway obstruction as needed for comfort.      07/22/21 0012           Code Status History     Date Active Date Inactive Code Status Order ID Comments User Context   07/21/2021 2247 07/22/2021 0012 DNR [id number]  Toy Baker, MD ED   05/13/2019  1817 05/15/2019 2036 Full Code [id number]  [place], Peter M, MD Inpatient   05/13/2019 1817 05/13/2019 1817 Full Code [id number]  [place], Peter M, MD Inpatient         IV Access:   Peripheral IV   Procedures and diagnostic studies:   No results found.   Medical Consultants:    None.   Subjective:    Barbara Mcconnell has not had a bowel movement.  Objective:    Vitals:   07/26/21 2034 07/27/21 0500 07/27/21 0632 07/27/21 0843  BP: (!) 157/78  (!) 163/81 (!) 155/92  Pulse: 90  95   Resp: 18  18   Temp: 97.7 F (36.5 C)  97.8 F (36.6 C)   TempSrc: Oral  Oral   SpO2: 98%  98%   Weight:  58.7 kg    Height:       SpO2: 98 % O2 Flow Rate (L/min): 2 L/min  No intake or output data in the 24 hours ending 07/27/21 0953  Filed Weights   07/25/21 0500 07/26/21 0416 07/27/21 0500  Weight: 58.6 kg 58.8 kg 58.7 kg    Exam: General exam: In no acute distress. Respiratory system: Good air movement and clear to auscultation. Cardiovascular system: S1 & S2 heard, RRR. No JVD. Gastrointestinal system: Abdomen is nondistended, soft and nontender.  Extremities: No pedal edema. Skin: No rashes, lesions or ulcers Psychiatry: Judgement and insight appear normal. Mood & affect appropriate.   Data Reviewed:    Labs: Basic Metabolic Panel: Recent Labs  Lab 07/21/21 1948 07/22/21 0022 07/22/21 0421 07/23/21 0745 07/23/21 5277 07/23/21 8242 07/23/21 0943 07/24/21 0119  NA 128*  --  131* 129* 128* 128* 128* 129*  K 4.2  --  3.9 4.1 3.8 3.9 3.8 4.1  CL 93*  --  93* 93*  --   --   --  93*  CO2 27  --  28 25  --   --   --  27  GLUCOSE 112*  --  109* 106*  --   --   --  101*  BUN 20  --  21 18  --   --   --  22  CREATININE 0.93  --  1.09* 0.95  --   --   --  1.00  CALCIUM 8.9  --  9.1 8.9  --   --   --  9.1  MG 2.1  --  2.2  --   --   --   --   --   PHOS  --  3.0 3.4  --   --   --   --   --     GFR Estimated Creatinine Clearance: 33.6 mL/min (by C-G formula based on SCr of 1 mg/dL). Liver Function Tests: Recent Labs  Lab 07/21/21 1948 07/22/21 0421  AST 106* 70*  ALT 121* 99*  ALKPHOS 205* 175*  BILITOT 0.8 0.9  PROT 7.1 6.6  ALBUMIN 3.5 3.2*    No results for input(s): LIPASE, AMYLASE in the last 168 hours. No results for input(s):  AMMONIA in the last 168 hours. Coagulation profile Recent Labs  Lab 07/21/21 1948  INR 1.0    COVID-19 Labs  No results for input(s): DDIMER, FERRITIN, LDH, CRP in the last 72 hours.   Lab Results  Component Value Date   [place] NEGATIVE 07/21/2021   [place] NEGATIVE 05/13/2019    CBC: Recent Labs  Lab 07/21/21 1948 07/22/21 0421 07/23/21 3536 07/23/21 1443 07/23/21 1540  WBC 8.4 7.5  --   --   --   NEUTROABS 5.8 4.7  --   --   --   HGB 11.5* 11.3* 10.5* 10.9* 10.5*  HCT 35.6* 33.6* 31.0* 32.0* 31.0*  MCV 91.8 90.8  --   --   --   PLT 267 275  --   --   --     Cardiac Enzymes: Recent Labs  Lab 07/22/21 0022  CKTOTAL 49    BNP (last 3 results) No results for input(s): PROBNP in the last 8760 hours. CBG: No results for input(s): GLUCAP in the last 168 hours. D-Dimer: No results for input(s): DDIMER in the last 72 hours.  Hgb A1c: No results for input(s): HGBA1C in the last 72 hours. Lipid Profile: No results for input(s): CHOL, HDL, LDLCALC, TRIG, CHOLHDL, LDLDIRECT in the last 72 hours. Thyroid function studies: No results for input(s): TSH, T4TOTAL, T3FREE, THYROIDAB in the last 72 hours.  Invalid input(s): FREET3  Anemia work up: No results for input(s): VITAMINB12, FOLATE, FERRITIN, TIBC, IRON, RETICCTPCT in the last 72 hours. Sepsis Labs: Recent Labs  Lab 07/21/21 1948 07/22/21 0022 07/22/21 0421 07/22/21 2156  WBC 8.4  --  7.5  --   LATICACIDVEN  --  0.9  --  0.7    Microbiology Recent Results (from the past 240 hour(s))  Resp Panel by RT-PCR (Flu A&B, Covid) Nasopharyngeal Swab     Status: None   Collection Time: 07/21/21  7:48 PM   Specimen: Nasopharyngeal Swab; Nasopharyngeal(NP) swabs in vial transport medium  Result Value Ref Range Status   SARS Coronavirus 2 by RT PCR NEGATIVE NEGATIVE Final    Comment: (NOTE) SARS-CoV-2 target nucleic acids are NOT DETECTED.  The SARS-CoV-2 RNA is generally detectable in upper  respiratory specimens during the acute phase of infection. The lowest concentration of SARS-CoV-2 viral copies this assay can detect is 138 copies/mL. A negative result does not preclude SARS-Cov-2 infection and should not be used as the sole basis for treatment or other patient management decisions. A negative result may occur with  improper specimen collection/handling, submission of specimen other than nasopharyngeal swab, presence of viral mutation(s) within the areas targeted by this assay, and inadequate number of viral copies(<138 copies/mL). A negative result must be combined with clinical observations, patient history, and epidemiological information. The expected result is Negative.  Fact Sheet for Patients:  EntrepreneurPulse.com.au  Fact Sheet for Healthcare Providers:  IncredibleEmployment.be  This test is no t yet approved or cleared by the [place] FDA and  has been authorized for detection and/or diagnosis of SARS-CoV-2 by FDA under an Emergency Use Authorization (EUA). This EUA will remain  in effect (meaning this test can be used) for the duration of the COVID-19 declaration under Section 564(b)(1) of the Act, 21 U.S.C.section 360bbb-3(b)(1), unless the authorization is terminated  or revoked sooner.       Influenza A by PCR NEGATIVE NEGATIVE Final   Influenza B by PCR NEGATIVE NEGATIVE Final    Comment: (NOTE) The Xpert Xpress SARS-CoV-2/FLU/RSV plus assay is intended as an aid in the diagnosis of influenza from Nasopharyngeal swab specimens and should not be used as a sole basis for treatment. Nasal washings and aspirates are unacceptable for Xpert Xpress SARS-CoV-2/FLU/RSV testing.  Fact Sheet for Patients: EntrepreneurPulse.com.au  Fact Sheet for Healthcare Providers: IncredibleEmployment.be  This test is not yet approved or cleared by the [place] FDA and has been  authorized for detection and/or diagnosis of SARS-CoV-2  by FDA under an Emergency Use Authorization (EUA). This EUA will remain in effect (meaning this test can be used) for the duration of the COVID-19 declaration under Section 564(b)(1) of the Act, 21 U.S.C. section 360bbb-3(b)(1), unless the authorization is terminated or revoked.  Performed at [facility], [place] [address], [place], [place] [address]      Medications:    aspirin EC  81 mg Oral Daily   azithromycin  250 mg Oral Daily   cholecalciferol  1,000 Units Oral Daily   diltiazem  120 mg Oral Daily   docusate sodium  100 mg Oral Q breakfast   escitalopram  5 mg Oral Daily   lidocaine  1 patch Transdermal Q24H   lisinopril  25 mg Oral Daily   loratadine  10 mg Oral Daily   metoprolol tartrate  25 mg Oral BID   montelukast  10 mg Oral QPC supper   pantoprazole  40 mg Oral Daily   polyethylene glycol  17 g Oral BID   sodium chloride flush  3 mL Intravenous Q12H   sodium chloride flush  3 mL Intravenous Q12H   sorbitol, milk of mag, mineral oil, glycerin (SMOG) enema  400 mL Rectal Once   Continuous Infusions:  sodium chloride     sodium chloride     sodium chloride        LOS: 6 days   Barbara Mcconnell  [facility]  07/27/2021, 9:53 AM

## 2021-07-27 NOTE — Consult Note (Addendum)
[facility] TEAM  Cardiology Consultation:   Patient ID: Barbara Mcconnell MRN: [id number]; DOB: 1934/12/31  Admit date: 07/21/2021 Date of Consult: 07/27/2021  Primary Care Provider: Kristopher Glee., MD [facility] Cardiologist: Peter Martinique, MD   Patient Profile:   Barbara Mcconnell is a 87 y.o. female with a hx of HTN, HLD with statin intolerance, peripheral vascular disease with moderate carotid artery stenosis and hx of Takotsubo cardiomyopathy with normalization who is being seen today for the evaluation of symptomatic, moderate aortic stenosis at the request of Dr. Ali Lowe.  History of Present Illness:   Barbara Mcconnell is a very functional 87yo who lives alone in [place]. She has one daughter who lives locally and is very involved with her care. She was initially seen by Dr. Martinique after presenting to [facility] 05/13/2019 with chest pain. She had previously been hospitalized about 5 years prior and was told that she had Takotsubo cardiomyopathy with mild non-obstructive CAD which had been treated medically. She reported normalization of her cardiac function although there are no records of her prior cath or echocardiogram reports. She had been doing very well but developed sub sternal chest pain with weakness and diaphoresis and presented to [facility] 04/2019 for further evaluation, found to have non-obstructive CAD per cardiac catheterization with severe LV dysfunction at 35-40% with wall motion abnormalities consistent with Takotsubo's cardiomyopathy. Echocardiogram 05/14/19 confirmed EF at 35-40% with moderate AS with a mean gradient at 21.5 mmHg, peak gradient at 44.7 mmHg, and AVA by VTI measuring 2.64 cm. Her medications were adjusted and she was seen in cardiology follow up several times in 2020 however has been lost to follow up until present admission. It appears that she had a follow up echocardiogram 09/2019 which showed normalized LV function to 60-65%  with no wall motion abnormalities moderate to severe aortic valve stenosis with AVA at 0.85 cm, mean gradient measures 20.5 mmHg and peak gradient at 31.44mHg.   She reports that up until about 5 weeks ago she was doing very well with no issues at all. She began to notice more exertional dyspnea. Activities which used to be easy for her became much harder due to SOB. She denies chest pain, palpitations, orthopnea, LE edema, dizziness, or syncope. She participates in regular exercise with silver sneakers three times per week without an issues. She performs all her own housework and continues to drive herself. More recently, as her symptoms progressed, she followed closely with her PCP along with a pulmonary team. 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 She then saw her PCP 07/21/21 and was found to be tachypneic and in mild respiratory distress and was therefore sent to the ED for further assessment. On arrival she was quite hypertensive with a BP at 201/109. Bedside echocardiogram performed by EDP showed pericardial effusion. CXR showed vascular congestion and small bilateral pleural effusion. BNP 201. HsT 63>>56>>51>>48. LFTs elevated on arrival however down trended  with diuresis. Creatine 0.93>>1.09>>1.00. Right upper quarter ultrasound was unremarkable. Repeat formal echocardiogram showed normal LVEF with a moderate circumfrential pericardial effusion measuring 1.5 cm. There was moderate AS with a mean gradient at 374mg (was 20.24m76m), AVA 1.38cm and DI 0.29. [facility] which showed stable, mild to moderate CAD with hemodynamics consistent with pulmonary hypertension. MD unable to cross aortic valve during procedure. Due to moderate AS, she was initially  felt to be a good PROGRESS trial candidate however with discrepancies between echocardiogram findings and inability to cross AV, pre-TAVR  CT imaging was obtained. These showed an AVA 1.01cm2, annular area measuring 422m2, and large circumferential pericardial effusion. After further discussion, she opted to defer PROGRESS trail consideration and move forward with TAVR given progressive symptoms, LVH with small LV cavity and calcium score at 2093. Her hospital stay was further complicated by new onset atrial fibrillation with RVR. She was placed on diltiazem infusion with relatively quick conversion to NSR. Additionally, she was scheduled for pericardiocentesis, planned for 07/27/20.   She is being evaluated by our multidisciplinary valve team and felt to have symptomatic, stage D aortic stenosis with Class III symptoms and felt to be a suitable candidate for TAVR.   Past Medical History:  Diagnosis Date   Aortic stenosis    CAD (coronary artery disease)    cath 04/2019: 50% pRCA   Coronary artery disease    Hyperlipemia    Hypertension    Takotsubo cardiomyopathy     Past Surgical History:  Procedure Laterality Date   RIGHT/LEFT HEART CATH AND CORONARY ANGIOGRAPHY N/A 05/13/2019   Procedure: RIGHT/LEFT HEART CATH AND CORONARY ANGIOGRAPHY;  Surgeon: JMartinique Peter M, MD;  [facility];  Service: Cardiovascular;  Laterality: N/A;   RIGHT/LEFT HEART CATH AND CORONARY ANGIOGRAPHY N/A 07/23/2021   Procedure: RIGHT/LEFT HEART CATH AND CORONARY ANGIOGRAPHY;  Surgeon: VJettie Booze MD;  [facility];  Service: Cardiovascular;  Laterality: N/A;   TONSILLECTOMY      Inpatient Medications: Scheduled Meds:  aspirin EC  81 mg Oral Daily   azithromycin  250 mg Oral Daily   cholecalciferol  1,000 Units Oral Daily   diltiazem  120 mg Oral Daily   docusate sodium  100 mg Oral Q breakfast   escitalopram  5 mg Oral Daily   lidocaine  1 patch Transdermal Q24H   lisinopril  25 mg Oral Daily   loratadine  10 mg Oral Daily   metoprolol tartrate  25 mg Oral BID   montelukast  10 mg Oral QPC supper    pantoprazole  40 mg Oral Daily   polyethylene glycol  17 g Oral BID   sodium chloride flush  3 mL Intravenous Q12H   sodium chloride flush  3 mL Intravenous Q12H   sorbitol, milk of mag, mineral oil, glycerin (SMOG) enema  400 mL Rectal Once   Continuous Infusions:  sodium chloride     sodium chloride     sodium chloride     PRN Meds: sodium chloride, sodium chloride, acetaminophen, acetaminophen, albuterol, ondansetron (ZOFRAN) IV, sodium chloride flush, sodium chloride flush  Allergies:    Allergies  Allergen Reactions   Codeine Nausea And Vomiting and Nausea Only   Cyclobenzaprine    Iodinated Contrast Media    Prednisone Other (See Comments)    Sores in mouth    Social History:   Social History   Socioeconomic History   Marital status: Divorced    Spouse name: Not on file   Number of children: Not on file   Years of education: Not on file   Highest education level: Not on file  Occupational History   Not on file  Tobacco Use   Smoking status: Never   Smokeless tobacco: Never  Vaping Use   Vaping Use: Never used  Substance and Sexual Activity   Alcohol use: No   Drug use: No   Sexual  activity: Not Currently  Other Topics Concern   Not on file  Social History Narrative   Not on file   Social Determinants of Health   Financial Resource Strain: Not on file  Food Insecurity: Not on file  Transportation Needs: Not on file  Physical Activity: Not on file  Stress: Not on file  Social Connections: Not on file  Intimate Partner Violence: Not on file    Family History:    Family History  Problem Relation Age of Onset   Hypertension Other     ROS:  Please see the history of present illness.   All other ROS reviewed and negative.     Physical Exam/Data:   Vitals:   07/26/21 1327 07/26/21 2034 07/27/21 0500 07/27/21 0632  BP:  (!) 157/78  (!) 163/81  Pulse:  90  95  Resp:  18  18  Temp:  97.7 F (36.5 C)  97.8 F (36.6 C)  TempSrc:  Oral  Oral   SpO2: 98% 98%  98%  Weight:   58.7 kg   Height:       No intake or output data in the 24 hours ending 07/27/21 0841 Last 3 Weights 07/27/2021 07/26/2021 07/25/2021  Weight (lbs) 129 lb 8 oz 129 lb 9.6 oz 129 lb 3.2 oz  Weight (kg) 58.741 kg 58.786 kg 58.605 kg     Body mass index is 25.29 kg/m.   General: Well developed, well nourished, NAD Neck: No JVD Lungs:Clear to ausculation bilaterally. Breathing is unlabored. Cardiovascular: RRR with S1 S2. + systolic murmur. Abdomen: Soft, non-tender, non-distended. No obvious abdominal masses. Extremities: No edema.  Neuro: Alert and oriented. No focal deficits. No facial asymmetry. MAE spontaneously. Psych: Responds to questions appropriately with normal affect.    EKG:  The EKG was personally reviewed and demonstrates:  07/26/20 NSR with HR 79bpm, evidence of LVH Telemetry:  Telemetry was personally reviewed and demonstrates:  07/27/20 NSR with infrequent PVCs, HR 70-90s.   Relevant CV Studies:  Coronary CT 07/24/21:  IMPRESSION: 1. Tri leaflet AV with calcium score 2093 and planimetered AVA 1.01 cm2   2.  Annular area of 407 mm2 suitable for a 23 mm Sapien 3 valve   3.  Coronary arteries sufficient height above annulus for deployment   4. Optimal angiographic angle for deployment LAO 20 Caudal 19 degrees   5.  Large circumferential pericardial effusion  CT Chest 07/24/21:  IMPRESSION: No evidence of thoracoabdominal aortic aneurysm or dissection.   Peripheral vascular disease with greater than 90% stenosis of the proximal right internal carotid artery. Plaque morphology appears irregular and may be better assessed with dedicated carotid artery Doppler sonography. 50% stenosis of the a dominant left vertebral artery at its origin. 50-75% stenosis of the inferior mesenteric artery at its origin, though this is of questionable clinical significance given patency of the celiac axis and superior mesenteric artery.   Morphologic  changes in keeping with fibromuscular dysplasia involving the renal arteries bilaterally.   Cardiomegaly with left ventricular hypertrophy. Extensive calcification of the aortic valve leaflets. Enlarging, moderate pericardial effusion without definite CT evidence of cardiac tamponade. Note, however, small bilateral pleural effusions have developed since prior examination and can be seen the setting of progressive cardiogenic failure. Pericardial fluid appears slightly hyperdense suggesting a proteinaceous or hemorrhagic component.   Cylindrical bronchiectasis and volume loss within the lingula and basilar right middle lobe with associated peribronchial nodularity. This may reflect the sequela of remote inflammation or reflect changes of  a chronic infectious process such as atypical mycobacterial or fungal pneumonia.   Multiple subcentimeter arterially enhancing lesions within the liver possibly representing small flash filled hemangioma. These are not optimally characterized on this examination may be better assessed with dedicated MRI examination if clinically indicated.    R/LHC 07/23/21:     Prox RCA lesion is 50% stenosed.   Prox Cx to Mid Cx lesion is 25% stenosed.   Prox LAD to Mid LAD lesion is 25% stenosed.   Hemodynamic findings consistent with pulmonary hypertension.   Stable, mild to moderate coronary artery disease.  No indication for PCI.   Unable to cross aortic valve, consistent with worsening noted on echocardiogram.  Continue plans for TAVR w/u.   Echocardiogram 07/22/21:   IMPRESSIONS Left ventricular ejection fraction, by estimation, is 60 to 65%. The left ventricle has normal function. The left ventricle has no regional wall motion abnormalities. There is moderate concentric left ventricular hypertrophy. Left ventricular diastolic parameters are consistent with Grade I diastolic dysfunction (impaired relaxation). Elevated left ventricular end-diastolic  pressure. 1.Right ventricular systolic function is normal. The right ventricular size is normal. There is normal pulmonary artery systolic pressure. 2. 3. Left atrial size was severely dilated. Pericardial effusion measuring 1.5 cm. Increased compared with 09/2019. . Moderate pericardial effusion. The pericardial effusion is circumferential. There is no evidence of cardiac tamponade. 4. The mitral valve is degenerative. Trivial mitral valve regurgitation. No evidence of mitral stenosis. Moderate mitral annular calcification. 5. AoV not well visualized. Appears calcified. Vmax 3.4 m/s, MG 33 mmHG, AVA 1.38 cm2, DI 0.29. Moderate aortic stenosis. Mean gradient has increased from 20 mmHg 09/2019 to 33 mmHg now. The aortic valve is tricuspid. There is moderate calcification of the aortic valve. There is moderate thickening of the aortic valve. Aortic valve regurgitation is mild. Moderate aortic valve stenosis. Aortic valve area, by VTI measures 1.38 cm. Aortic valve mean gradient measures 33.0 mmHg. Aortic valve Vmax measures 3.41 m/s. 6. 7. Aortic dilatation noted. There is mild dilatation of the ascending aorta, measuring 37   Echocardiogram 09/24/2019:   1. Left ventricular ejection fraction, by estimation, is 60 to 65%. The left ventricle has normal function. The left ventricle has no regional wall motion abnormalities. There is severe asymmetric left ventricular hypertrophy of the septal segment (Sigmoid basal septum). Left ventricular diastolic parameters are consistent with Grade II diastolic dysfunction (pseudonormalization). 2. Right ventricular systolic function is normal. The right ventricular size is normal. There is normal pulmonary artery systolic pressure. 3. Left atrial size was mildly dilated. 4. There is mild posterior mitral annular calcification. Mild to moderate mitral valve regurgitation. No evidence of mitral stenosis. 5. The aortic valve is is thickened and  moderately calcified with annular calcification. Aortic valve regurgitation is mild to moderate. Moderate to severe aortic valve stenosis. Aortic valve area, by VTI measures 0.85 cm. Aortic valve mean gradient measures 20.5 mmHg. Aortic valve Vmax measures 2.81 m/s. 6. Compared to prior study done is October 2020, there have been improvement in the EF which was 30-35% with wall motion abnormalities. Now EF is 60-65% with no wall motion abnormality. In addition, the Aortic stenosis is now moderate to severe was moderate.   R/LHC 05/13/2019:   Prox RCA lesion is 50% stenosed. There is moderate to severe left ventricular systolic dysfunction. LV end diastolic pressure is moderately elevated. The left ventricular ejection fraction is 35-45% by visual estimate. There is trivial (1+) mitral regurgitation. Hemodynamic findings consistent with moderate pulmonary hypertension.  1. Nonobstructive CAD 2. Moderate to severe LV dysfunction. EF 35-40%. Wall motion abnormality classic for Takotsubo's cardiomyopathy 3. Moderately elevated LV filling pressures 4. Moderate pulmonary venous HTN 5. Reduced cardiac output. CI 2.3   Plan: patient was SOB in the lab. We administered IV Ntg and lasix in the lab and increased oxygen with improvement in her symptoms and improvement in PA sat from 62% to 66%. Will continue with IV diuresis, continue ACEi and Metoprolol. Echo.   Laboratory Data:  High Sensitivity Troponin:   Recent Labs  Lab 06/30/21 1532 07/21/21 1948 07/21/21 2152 07/22/21 0022 07/22/21 0421  TROPONINIHS 13 63* 56* 51* 48*     Chemistry Recent Labs  Lab 07/22/21 0421 07/23/21 0745 07/23/21 0937 07/23/21 0942 07/23/21 0943 07/24/21 0119  NA 131* 129*   < > 128* 128* 129*  K 3.9 4.1   < > 3.9 3.8 4.1  CL 93* 93*  --   --   --  93*  CO2 28 25  --   --   --  27  GLUCOSE 109* 106*  --   --   --  101*  BUN 21 18  --   --   --  22  CREATININE 1.09* 0.95  --   --   --  1.00   CALCIUM 9.1 8.9  --   --   --  9.1  GFRNONAA 50* 59*  --   --   --  56*  ANIONGAP 10 11  --   --   --  9   < > = values in this interval not displayed.    Recent Labs  Lab 07/21/21 1948 07/22/21 0421  PROT 7.1 6.6  ALBUMIN 3.5 3.2*  AST 106* 70*  ALT 121* 99*  ALKPHOS 205* 175*  BILITOT 0.8 0.9   Hematology Recent Labs  Lab 07/21/21 1948 07/22/21 0421 07/23/21 0937 07/23/21 0942 07/23/21 0943  WBC 8.4 7.5  --   --   --   RBC 3.88 3.70*  --   --   --   HGB 11.5* 11.3* 10.5* 10.9* 10.5*  HCT 35.6* 33.6* 31.0* 32.0* 31.0*  MCV 91.8 90.8  --   --   --   MCH 29.6 30.5  --   --   --   MCHC 32.3 33.6  --   --   --   RDW 13.4 13.4  --   --   --   PLT 267 275  --   --   --    BNP Recent Labs  Lab 07/21/21 1948  BNP 201.8*    DDimer  Recent Labs  Lab 07/21/21 1948  DDIMER 1.58*     Radiology/Studies:  CT CHEST W CONTRAST  Result Date: 07/24/2021 CLINICAL DATA:  Thoracic aorta disease, post-op; Aneurysm, pelvis or lower extremity. Aneurysm, pelvis or lower extremity. Severe symptomatic aortic stenosis. EXAM: CT ANGIOGRAPHY CHEST, ABDOMEN AND PELVIS TECHNIQUE: Multidetector CT imaging through the chest, abdomen and pelvis was performed using the standard protocol during bolus administration of intravenous contrast. Multiplanar reconstructed images and MIPs were obtained and reviewed to evaluate the vascular anatomy. CONTRAST:  143m OMNIPAQUE IOHEXOL 350 MG/ML SOLN COMPARISON:  07/18/2021 FINDINGS: CTA CHEST FINDINGS Cardiovascular: The thoracic aorta is of normal caliber, measuring 3.6 x 3.7 cm in its ascending segment, 2.9 x 2.6 cm within the arch just beyond the takeoff of the left subclavian artery, and 2.6 x 2.2 cm within its descending segment at the level of  the left atrium. No intramural hematoma or dissection. Moderate calcified atherosclerotic plaque. Conventional arch vessel anatomic configuration. Less than 50% stenosis of the left subclavian artery at its  origin. Extensive, irregular mixed atherosclerotic plaque at the right carotid bifurcation results in a greater than 90% stenosis of the proximal right internal carotid artery calcified atherosclerotic plaque at the left carotid bifurcation results in a less than 50% stenosis the proximal left internal carotid artery. Calcified atherosclerotic plaque results in a 50% stenosis of the dominant left vertebral artery at its origin. Mild cardiomegaly with left ventricular hypertrophy noted. Extensive atherosclerotic calcification of the aortic valve leaflets. The aortic valve is trileaflet. Moderate coronary artery calcification with probable stenting of the proximal right coronary artery. Moderate pericardial effusion is present, enlarged in size when compared to prior examination. Pericardial fluid appears slightly hyperdense suggesting proteinaceous or hemorrhagic fluid. No CT evidence of cardiac tamponade. The central pulmonary arteries are of normal caliber. Mediastinum/Nodes: No enlarged mediastinal, hilar, or axillary lymph nodes. Thyroid gland, trachea, and esophagus demonstrate no significant findings. Lungs/Pleura: Small bilateral pleural effusions are present with compressive atelectasis of the a lower lobes bilaterally, left greater than right. There is cylindrical bronchiectasis and volume loss noted within the lingula and basilar right middle lobe. Mild peribronchial nodularity within these regions may reflect the sequela of remote inflammation or a chronic atypical infectious process as can be seen with mycobacterial or fungal infection. No confluent pulmonary infiltrate. No pneumothorax. No central obstructing lesion. Musculoskeletal: No acute bone abnormality. No lytic or blastic bone lesion. Review of the MIP images confirms the above findings. CTA ABDOMEN AND PELVIS FINDINGS VASCULAR Aorta: Normal caliber. No aneurysm or dissection. Moderate atherosclerotic calcification. No periaortic inflammatory  change. Celiac: Less than 50% stenosis of the a celiac origin secondary to calcified atherosclerotic plaque. Distally widely patent. No aneurysm or dissection. SMA: Patent without evidence of aneurysm, dissection, vasculitis or significant stenosis. Renals: Single renal arteries bilaterally. Less than 50% stenosis of the origin of the right renal artery. Widely patent left renal artery. Both arteries demonstrate a a beaded appearance within there mid segments in keeping with changes of fibromuscular dysplasia. No aneurysm or dissection. IMA: 50-75% stenosis of the origin.  Distally widely patent. Inflow: Mild atherosclerotic calcification. No evidence of hemodynamically significant stenosis. No aneurysm or dissection. Common iliac arteries measure 8-9 mm in diameter bilaterally. External iliac arteries measure 6 mm in diameter bilaterally. Common femoral arteries measure 7 mm in diameter bilaterally. Veins: No obvious venous abnormality within the limitations of this arterial phase study. Review of the MIP images confirms the above findings. NON-VASCULAR Hepatobiliary: Geographic region of hyperenhancement within segment 2 of the liver may relate to underlying arterial portal shunting with a resultant transient hepatic attenuation difference (THAD). Similar region is seen within segment 4B of the liver. There are at least 3 arterial enhancing rounded lesions within segment 4 and segment 8 of the liver measuring up to 8 mm (image # 137/4) which are nonspecific but may represent small flash filled hemangioma. The liver is otherwise unremarkable. Gallbladder unremarkable. No intra or extrahepatic biliary ductal dilation. Pancreas: Unremarkable Spleen: Unremarkable Adrenals/Urinary Tract: The adrenal glands are unremarkable. The kidneys are normal in position. Moderate left renal asymmetric cortical atrophy. No enhancing intrarenal masses. No hydronephrosis. No intrarenal or ureteral calculi. The bladder is  unremarkable. Stomach/Bowel: Stomach is within normal limits. Appendix appears normal. No evidence of bowel wall thickening, distention, or inflammatory changes. Lymphatic: No pathologic adenopathy within the abdomen and pelvis. Reproductive:  Uterus and bilateral adnexa are unremarkable. Other: Surgical dressing noted within the right inguinal region. No abdominal wall hernia. Musculoskeletal: Degenerative changes are noted within the lumbar spine with grade 1 anterolisthesis of L4-5. No acute bone abnormality. No lytic or blastic bone lesion. Review of the MIP images confirms the above findings. IMPRESSION: No evidence of thoracoabdominal aortic aneurysm or dissection. Peripheral vascular disease with greater than 90% stenosis of the proximal right internal carotid artery. Plaque morphology appears irregular and may be better assessed with dedicated carotid artery Doppler sonography. 50% stenosis of the a dominant left vertebral artery at its origin. 50-75% stenosis of the inferior mesenteric artery at its origin, though this is of questionable clinical significance given patency of the celiac axis and superior mesenteric artery. 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 Cardiomegaly with left ventricular hypertrophy. Extensive calcification of the aortic valve leaflets. Enlarging, moderate pericardial effusion without definite CT evidence of cardiac tamponade. Note, however, small bilateral pleural effusions have developed since prior examination and can be seen the setting of progressive cardiogenic failure. Pericardial fluid appears slightly hyperdense suggesting a proteinaceous or hemorrhagic component. Cylindrical bronchiectasis and volume loss within the lingula and basilar right middle lobe with associated peribronchial nodularity. This may reflect the sequela of remote inflammation or reflect changes of a chronic infectious process such as atypical  mycobacterial or fungal pneumonia. Multiple subcentimeter arterially enhancing lesions within the liver possibly representing small flash filled hemangioma. These are not optimally characterized on this examination may be better assessed with dedicated MRI examination if clinically indicated. Aortic Atherosclerosis (ICD10-I70.0). Electronically Signed   By: Fidela Salisbury M.D.   On: 07/24/2021 21:16   CT ANGIO ABDOMEN W &/OR WO CONTRAST  Result Date: 07/24/2021 CLINICAL DATA:  Thoracic aorta disease, post-op; Aneurysm, pelvis or lower extremity. Aneurysm, pelvis or lower extremity. Severe symptomatic aortic stenosis. EXAM: CT ANGIOGRAPHY CHEST, ABDOMEN AND PELVIS TECHNIQUE: Multidetector CT imaging through the chest, abdomen and pelvis was performed using the standard protocol during bolus administration of intravenous contrast. Multiplanar reconstructed images and MIPs were obtained and reviewed to evaluate the vascular anatomy. CONTRAST:  150m OMNIPAQUE IOHEXOL 350 MG/ML SOLN COMPARISON:  07/18/2021 FINDINGS: CTA CHEST FINDINGS Cardiovascular: The thoracic aorta is of normal caliber, measuring 3.6 x 3.7 cm in its ascending segment, 2.9 x 2.6 cm within the arch just beyond the takeoff of the left subclavian artery, and 2.6 x 2.2 cm within its descending segment at the level of the left atrium. No intramural hematoma or dissection. Moderate calcified atherosclerotic plaque. Conventional arch vessel anatomic configuration. Less than 50% stenosis of the left subclavian artery at its origin. Extensive, irregular mixed atherosclerotic plaque at the right carotid bifurcation results in a greater than 90% stenosis of the proximal right internal carotid artery calcified atherosclerotic plaque at the left carotid bifurcation results in a less than 50% stenosis the proximal left internal carotid artery. Calcified atherosclerotic plaque results in a 50% stenosis of the dominant left vertebral artery at its origin. Mild  cardiomegaly with left ventricular hypertrophy noted. Extensive atherosclerotic calcification of the aortic valve leaflets. The aortic valve is trileaflet. Moderate coronary artery calcification with probable stenting of the proximal right coronary artery. Moderate pericardial effusion is present, enlarged in size when compared to prior examination. Pericardial fluid appears slightly hyperdense suggesting proteinaceous or hemorrhagic fluid. No CT evidence of cardiac tamponade. The central pulmonary arteries are of normal caliber. Mediastinum/Nodes: No enlarged mediastinal, hilar, or axillary lymph nodes. Thyroid gland,  trachea, and esophagus demonstrate no significant findings. Lungs/Pleura: Small bilateral pleural effusions are present with compressive atelectasis of the a lower lobes bilaterally, left greater than right. There is cylindrical bronchiectasis and volume loss noted within the lingula and basilar right middle lobe. Mild peribronchial nodularity within these regions may reflect the sequela of remote inflammation or a chronic atypical infectious process as can be seen with mycobacterial or fungal infection. No confluent pulmonary infiltrate. No pneumothorax. No central obstructing lesion. Musculoskeletal: No acute bone abnormality. No lytic or blastic bone lesion. Review of the MIP images confirms the above findings. CTA ABDOMEN AND PELVIS FINDINGS VASCULAR Aorta: Normal caliber. No aneurysm or dissection. Moderate atherosclerotic calcification. No periaortic inflammatory change. Celiac: Less than 50% stenosis of the a celiac origin secondary to calcified atherosclerotic plaque. Distally widely patent. No aneurysm or dissection. SMA: Patent without evidence of aneurysm, dissection, vasculitis or significant stenosis. Renals: Single renal arteries bilaterally. Less than 50% stenosis of the origin of the right renal artery. Widely patent left renal artery. Both arteries demonstrate a a beaded appearance  within there mid segments in keeping with changes of fibromuscular dysplasia. No aneurysm or dissection. IMA: 50-75% stenosis of the origin.  Distally widely patent. Inflow: Mild atherosclerotic calcification. No evidence of hemodynamically significant stenosis. No aneurysm or dissection. Common iliac arteries measure 8-9 mm in diameter bilaterally. External iliac arteries measure 6 mm in diameter bilaterally. Common femoral arteries measure 7 mm in diameter bilaterally. Veins: No obvious venous abnormality within the limitations of this arterial phase study. Review of the MIP images confirms the above findings. NON-VASCULAR Hepatobiliary: Geographic region of hyperenhancement within segment 2 of the liver may relate to underlying arterial portal shunting with a resultant transient hepatic attenuation difference (THAD). Similar region is seen within segment 4B of the liver. There are at least 3 arterial enhancing rounded lesions within segment 4 and segment 8 of the liver measuring up to 8 mm (image # 137/4) which are nonspecific but may represent small flash filled hemangioma. The liver is otherwise unremarkable. Gallbladder unremarkable. No intra or extrahepatic biliary ductal dilation. Pancreas: Unremarkable Spleen: Unremarkable Adrenals/Urinary Tract: The adrenal glands are unremarkable. The kidneys are normal in position. Moderate left renal asymmetric cortical atrophy. No enhancing intrarenal masses. No hydronephrosis. No intrarenal or ureteral calculi. The bladder is unremarkable. Stomach/Bowel: Stomach is within normal limits. Appendix appears normal. No evidence of bowel wall thickening, distention, or inflammatory changes. Lymphatic: No pathologic adenopathy within the abdomen and pelvis. Reproductive: Uterus and bilateral adnexa are unremarkable. Other: Surgical dressing noted within the right inguinal region. No abdominal wall hernia. Musculoskeletal: Degenerative changes are noted within the lumbar  spine with grade 1 anterolisthesis of L4-5. No acute bone abnormality. No lytic or blastic bone lesion. Review of the MIP images confirms the above findings. IMPRESSION: No evidence of thoracoabdominal aortic aneurysm or dissection. Peripheral vascular disease with greater than 90% stenosis of the proximal right internal carotid artery. Plaque morphology appears irregular and may be better assessed with dedicated carotid artery Doppler sonography. 50% stenosis of the a dominant left vertebral artery at its origin. 50-75% stenosis of the inferior mesenteric artery at its origin, though this is of questionable clinical significance given patency of the celiac axis and superior mesenteric artery. 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 Cardiomegaly with left ventricular hypertrophy. Extensive calcification of the aortic valve leaflets. Enlarging, moderate pericardial effusion without definite CT evidence of cardiac tamponade. Note, however, small bilateral pleural effusions have  developed since prior examination and can be seen the setting of progressive cardiogenic failure. Pericardial fluid appears slightly hyperdense suggesting a proteinaceous or hemorrhagic component. Cylindrical bronchiectasis and volume loss within the lingula and basilar right middle lobe with associated peribronchial nodularity. This may reflect the sequela of remote inflammation or reflect changes of a chronic infectious process such as atypical mycobacterial or fungal pneumonia. Multiple subcentimeter arterially enhancing lesions within the liver possibly representing small flash filled hemangioma. These are not optimally characterized on this examination may be better assessed with dedicated MRI examination if clinically indicated. Aortic Atherosclerosis (ICD10-I70.0). Electronically Signed   By: Fidela Salisbury M.D.   On: 07/24/2021 21:16   CARDIAC CATHETERIZATION  Result  Date: 07/23/2021   Prox RCA lesion is 50% stenosed.   Prox Cx to Mid Cx lesion is 25% stenosed.   Prox LAD to Mid LAD lesion is 25% stenosed.   Hemodynamic findings consistent with pulmonary hypertension. Stable, mild to moderate coronary artery disease.  No indication for PCI. Unable to cross aortic valve, consistent with worsening noted on echocardiogram.  Continue plans for TAVR w/u.   CT CORONARY MORPH W/CTA COR W/SCORE W/CA W/CM &/OR WO/CM  Result Date: 07/25/2021 CLINICAL DATA:  Aortic Stenosis EXAM: Cardiac TAVR CT TECHNIQUE: The patient was scanned on a Siemens Force 160 slice scanner. A 120 kV retrospective scan was triggered in the ascending thoracic aorta at 140 HU's. Gantry rotation speed was 250 msecs and collimation was .6 mm. No beta blockade or nitro were given. The 3D data set was reconstructed in 5% intervals of the R-R cycle. Systolic and diastolic phases were analyzed on a dedicated work station using MPR, MIP and VRT modes. The patient received 80 cc of contrast. FINDINGS: Aortic Valve: Tri leaflet calcified score 2093 Aorta: Bovine arch no aneurysm moderate calcific atherosclerosis Sino-tubular Junction: 25 nn Ascending Thoracic Aorta: 35 mm Aortic Arch: 27 mm Descending Thoracic Aorta: 27 mm Sinus of Valsalva Measurements: Non-coronary: 29.3 mm Right - coronary: 28.6 mm Left -   coronary: 28 mm Coronary Artery Height above Annulus: Left Main: 15 mm above annulus Right Coronary: 15.6 mm above annulus Virtual Basal Annulus Measurements: Average  diameter: 22.8 mm Perimeter: 74.9 mm Area: 407 mm2 AVA by planimetry 1.01 cm2 Coronary Arteries: Sufficient height above annulus for deployment Optimum Fluoroscopic Angle for Delivery: LAO 20 Caudal 19 degrees IMPRESSION: 1. Tri leaflet AV with calcium score 2093 and planimetered AVA 1.01 cm2 2.  Annular area of 407 mm2 suitable for a 23 mm Sapien 3 valve 3.  Coronary arteries sufficient height above annulus for deployment 4. Optimal angiographic  angle for deployment LAO 20 Caudal 19 degrees 5.  Large circumferential pericardial effusion Jenkins Rouge Electronically Signed   By: Jenkins Rouge M.D.   On: 07/25/2021 06:57    STS Risk Calculator: Procedure: AV Replacement  Risk of Mortality: 3.209% Renal Failure: 3.347% Permanent Stroke: 2.478% Prolonged Ventilation: 10.012% DSW Infection: 0.071% Reoperation: 3.514% Morbidity or Mortality:15.283% Short Length of Stay: 24.645% Long Length of Stay: 7.788%  [facility] Cardiomyopathy Questionnaire  KCCQ-12 07/23/2021  1 a. Ability to shower/bathe Slightly limited  1 b. Ability to walk 1 block Slightly limited  1 c. Ability to hurry/jog Other, Did not do  2. Edema feet/ankles/legs Less than once a week  3. Limited by fatigue 1-2 times a week  4. Limited by dyspnea At least once a day  5. Sitting up / on 3+ pillows Less than once a week  6. Limited  enjoyment of life Moderately limited  7. Rest of life w/ symptoms Mostly dissatisfied  8 a. Participation in hobbies Limited quite a bit  8 b. Participation in chores Moderately limited  8 c. Visiting family/friends Slightly limited    Assessment and Plan:   Barbara Mcconnell is a 87 y.o. female with moderate, symptomatic stage D aortic stenosis with NYHA Class III symptoms. I have reviewed the patient's recent echocardiogram which is notable for normal LV systolic function and moderate aortic stenosis with peak gradient of 46.43mHg and mean transvalvular gradient of 354mg. The patient's dimensionless index is 0.29 and calculated aortic valve area is 1.38 cm although coronary CT imaging shows tri-leaflet AV with AVA at 1.01cm2, calcium score 2093, coronaries at sufficient height for deployment and large circumferential pericardial effusion with plans for pericardiocentesis today, 07/27/20.    I have reviewed the natural history of aortic stenosis with the patient. We have discussed the limitations of medical therapy and the poor prognosis  associated with symptomatic aortic stenosis. We have reviewed potential treatment options, including palliative medical therapy, conventional surgical aortic valve replacement, and transcatheter aortic valve replacement. We discussed treatment options in the context of this patient's specific comorbid medical conditions.    The patient's predicted risk of mortality with conventional aortic valve replacement is 3.2% primarily based on  recent PNA, acute CHF with new circumferential pericardial and pleural effusions. Other significant comorbid conditions include advanced age. TAVR seems like a reasonable treatment option given progressive symptoms despite imagining confirming moderate aortic stenosis.   For questions or updates, please contact [facility] consult [email or web address] for contact info under    Signed, JiKathyrn DrownNP  07/27/2021 8:41 AM    Chart reviewed, patient examined, agree with above.  The patient is an active [age]ear old woman with a prior history of moderate aortic stenosis who presents with a 5 to 6-week history of progressive exertional shortness of breath and fatigue.  She says that in retrospect she has noted a more gradual onset of symptoms but they have progressed quickly more recently.  She saw her PCP on 07/21/2021 and was noted to be in mild respiratory distress and was sent to the emergency room.  Chest x-ray showed vascular congestion and small bilateral pleural effusions.  She was markedly hypertensive in the emergency room.  A bedside echocardiogram performed by the emergency room physician showed a pericardial effusion.  She had a formal 2D echocardiogram showing a calcified aortic valve with a mean pressure gradient of 33 mmHg.  Her previous mean aortic valve gradient was 20 mmHg in March 2021.  Left ventricular ejection fraction was 60 to 65%.  There was moderate concentric LVH.  There was a moderate sized circumferential pericardial effusion with no evidence  of tamponade.  Cardiac catheterization showed mild to moderate nonobstructive coronary disease.  She underwent a gated cardiac CTA which showed a calcified aortic valve with a planimetry aortic valve area of 1.0 cm.  There was a large circumferential pericardial effusion.  On my exam today she is short of breath walking from the bathroom back to her bed.  It took her about 15 minutes after she sat down to recover from that.  There is a 3/6 harsh systolic murmur along the right sternal border.  There is no diastolic murmur.  Lung exam reveals clear breath sounds bilaterally.  There is no peripheral edema.  Although her mean gradient is only 33 mmHg she has a severely calcified and thickened aortic valve with restricted  leaflet mobility and given her NYHA class III symptoms I think this represents stage D severe symptomatic aortic stenosis.  I think the best treatment is aortic valve replacement.  Given her age I think transcatheter aortic valve replacement would be the best option for her.  Her gated cardiac CTA shows anatomy suitable for TAVR using a SAPIEN 3 valve.  Her abdominal and pelvic CTA shows adequate pelvic vascular anatomy to allow transfemoral insertion.  I think she should have a pericardiocentesis to drain the large circumferential pericardial effusion preoperatively since this may be contributing to her symptoms and certainly could affect her hemodynamics during the procedure especially if it is complicated.  The patient and her daughter ( by conference call) were counseled at length regarding treatment alternatives for management of severe symptomatic aortic stenosis. The risks and benefits of surgical intervention has been discussed in detail. Long-term prognosis with medical therapy was discussed. Alternative approaches such as conventional surgical aortic valve replacement, transcatheter aortic valve replacement, and palliative medical therapy were compared and contrasted at length. This  discussion was placed in the context of the patient's own specific clinical presentation and past medical history. All of their questions have been addressed.   Following the decision to proceed with transcatheter aortic valve replacement, a discussion was held regarding what types of management strategies would be attempted intraoperatively in the event of life-threatening complications, including whether or not the patient would be considered a candidate for the use of cardiopulmonary bypass and/or conversion to open sternotomy for attempted surgical intervention. The patient is aware of the fact that transient use of cardiopulmonary bypass may be necessary. The patient has been advised of a variety of complications that might develop including but not limited to risks of death, stroke, paravalvular leak, aortic dissection or other major vascular complications, aortic annulus rupture, device embolization, cardiac rupture or perforation, mitral regurgitation, acute myocardial infarction, arrhythmia, heart block or bradycardia requiring permanent pacemaker placement, congestive heart failure, respiratory failure, renal failure, pneumonia, infection, other late complications related to structural valve deterioration or migration, or other complications that might ultimately cause a temporary or permanent loss of functional independence or other long term morbidity. The patient provides full informed consent for the procedure as described and all questions were answered.   She is apparently scheduled for pericardiocentesis tomorrow.  Then a decision can be made about discharging her home preoperatively to return for TAVR next Tuesday.  Gaye Pollack, MD 07/27/2021

## 2021-07-28 ENCOUNTER — Encounter (HOSPITAL_COMMUNITY): Payer: Self-pay | Admitting: Internal Medicine

## 2021-07-28 ENCOUNTER — Encounter (HOSPITAL_COMMUNITY)
Admission: EM | Disposition: A | Discharge status: Home / Self Care | Payer: Self-pay | Source: Ambulatory Visit | Attending: Internal Medicine

## 2021-07-28 ENCOUNTER — Inpatient Hospital Stay (HOSPITAL_COMMUNITY): Payer: Medicare HMO

## 2021-07-28 DIAGNOSIS — I3139 Other pericardial effusion (noninflammatory): Secondary | ICD-10-CM

## 2021-07-28 DIAGNOSIS — I480 Paroxysmal atrial fibrillation: Secondary | ICD-10-CM

## 2021-07-28 HISTORY — PX: PERICARDIOCENTESIS: CATH118255

## 2021-07-28 LAB — ECHOCARDIOGRAM LIMITED
Height: 60 in
Weight: 2075.2 [oz_av]

## 2021-07-28 LAB — COMPREHENSIVE METABOLIC PANEL WITH GFR
ALT: 31 U/L (ref 0–44)
AST: 18 U/L (ref 15–41)
Albumin: 2.7 g/dL — ABNORMAL LOW (ref 3.5–5.0)
Alkaline Phosphatase: 82 U/L (ref 38–126)
Anion gap: 7 (ref 5–15)
BUN: 14 mg/dL (ref 8–23)
CO2: 28 mmol/L (ref 22–32)
Calcium: 8.9 mg/dL (ref 8.9–10.3)
Chloride: 90 mmol/L — ABNORMAL LOW (ref 98–111)
Creatinine, Ser: 0.82 mg/dL (ref 0.44–1.00)
GFR, Estimated: >60 mL/min
Glucose, Bld: 106 mg/dL — ABNORMAL HIGH (ref 70–99)
Potassium: 4.3 mmol/L (ref 3.5–5.1)
Sodium: 125 mmol/L — ABNORMAL LOW (ref 135–145)
Total Bilirubin: 0.7 mg/dL (ref 0.3–1.2)
Total Protein: 5.9 g/dL — ABNORMAL LOW (ref 6.5–8.1)

## 2021-07-28 LAB — BODY FLUID CELL COUNT WITH DIFFERENTIAL
Eos, Fluid: 0 %
Lymphs, Fluid: 49 %
Monocyte-Macrophage-Serous Fluid: 4 % — ABNORMAL LOW (ref 50–90)
Neutrophil Count, Fluid: 47 % — ABNORMAL HIGH (ref 0–25)
Total Nucleated Cell Count, Fluid: UNDETERMINED uL (ref 0–1000)

## 2021-07-28 LAB — CBC
HCT: 33.3 % — ABNORMAL LOW (ref 36.0–46.0)
Hemoglobin: 11.4 g/dL — ABNORMAL LOW (ref 12.0–15.0)
MCH: 30 pg (ref 26.0–34.0)
MCHC: 34.2 g/dL (ref 30.0–36.0)
MCV: 87.6 fL (ref 80.0–100.0)
Platelets: 352 10*3/uL (ref 150–400)
RBC: 3.8 MIL/uL — ABNORMAL LOW (ref 3.87–5.11)
RDW: 12.9 % (ref 11.5–15.5)
WBC: 7.2 10*3/uL (ref 4.0–10.5)
nRBC: 0 % (ref 0.0–0.2)

## 2021-07-28 LAB — OSMOLALITY: Osmolality: 267 mOsm/kg — ABNORMAL LOW (ref 275–295)

## 2021-07-28 SURGERY — PERICARDIOCENTESIS
Anesthesia: LOCAL

## 2021-07-28 MED ORDER — FENTANYL CITRATE (PF) 100 MCG/2ML IJ SOLN
INTRAMUSCULAR | Status: DC | PRN
  Administered 2021-07-28: 25 ug via INTRAVENOUS

## 2021-07-28 MED ORDER — MIDAZOLAM HCL 2 MG/2ML IJ SOLN
INTRAMUSCULAR | Status: DC | PRN
  Administered 2021-07-28: 1 mg via INTRAVENOUS

## 2021-07-28 MED ORDER — FUROSEMIDE 40 MG PO TABS
40.0000 mg | ORAL_TABLET | Freq: Every day | ORAL | Status: DC
  Administered 2021-07-28: 40 mg via ORAL
  Filled 2021-07-28: qty 1

## 2021-07-28 MED ORDER — MIDAZOLAM HCL 2 MG/2ML IJ SOLN
INTRAMUSCULAR | Status: AC
  Filled 2021-07-28: qty 2

## 2021-07-28 MED ORDER — LIDOCAINE HCL (PF) 1 % IJ SOLN
INTRAMUSCULAR | Status: AC
  Filled 2021-07-28: qty 30

## 2021-07-28 MED ORDER — LIDOCAINE HCL (PF) 1 % IJ SOLN
INTRAMUSCULAR | Status: DC | PRN
  Administered 2021-07-28: 5 mL

## 2021-07-28 MED ORDER — IOHEXOL 350 MG/ML SOLN
INTRAVENOUS | Status: DC | PRN
  Administered 2021-07-28: 10 mL

## 2021-07-28 MED ORDER — FENTANYL CITRATE (PF) 100 MCG/2ML IJ SOLN
INTRAMUSCULAR | Status: AC
  Filled 2021-07-28: qty 2

## 2021-07-28 MED ORDER — HEPARIN (PORCINE) IN NACL 1000-0.9 UT/500ML-% IV SOLN
INTRAVENOUS | Status: DC | PRN
  Administered 2021-07-28: 500 mL

## 2021-07-28 MED ORDER — CHLORHEXIDINE GLUCONATE CLOTH 2 % EX PADS
6.0000 | MEDICATED_PAD | Freq: Every day | CUTANEOUS | Status: DC
  Administered 2021-07-28 – 2021-07-30 (×3): 6 via TOPICAL

## 2021-07-28 SURGICAL SUPPLY — 11 items
ELECT DEFIB PAD ADLT CADENCE (PAD) ×1 IMPLANT
KIT PERIVAC ST CATH 8.3FR (TRAY / TRAY PROCEDURE) ×1 IMPLANT
PACK CARDIAC CATHETERIZATION (CUSTOM PROCEDURE TRAY) ×1 IMPLANT
PROTECTION STATION PRESSURIZED (MISCELLANEOUS) ×2
STATION PROTECTION PRESSURIZED (MISCELLANEOUS) IMPLANT
TRANSDUCER W/STOPCOCK (MISCELLANEOUS) ×1 IMPLANT
TUBING ART PRESS 72  MALE/FEM (TUBING) ×1
TUBING ART PRESS 72 MALE/FEM (TUBING) IMPLANT
TUBING CIL FLEX 10 FLL-RA (TUBING) ×1 IMPLANT
WIRE MICRO SET SILHO 5FR 7 (SHEATH) ×1 IMPLANT
WIRE MICROINTRODUCER 60CM (WIRE) ×1 IMPLANT

## 2021-07-28 NOTE — Interval H&P Note (Signed)
History and Physical Interval Note:  07/28/2021 7:47 AM  Barbara Mcconnell  has presented today for surgery, with the diagnosis of pericardial effusion.  The various methods of treatment have been discussed with the patient and family. After consideration of risks, benefits and other options for treatment, the patient has consented to  Procedure(s): PERICARDIOCENTESIS (N/A) as a surgical intervention.  The patient's history has been reviewed, patient examined, no change in status, stable for surgery.  I have reviewed the patient's chart and labs.  Questions were answered to the patient's satisfaction.     Early Osmond

## 2021-07-28 NOTE — Progress Notes (Signed)
°   07/28/21 1445  Clinical Encounter Type  Visited With Patient  Visit Type Follow-up;Spiritual support  Referral From Nurse  Consult/Referral To Chaplain   Chaplain Tery Sanfilippo received a call from RN,Zack stating the patient's daughter is requesting a notary to sign the Advanced Directive. Donnajean Lopes along with the notary and two witness were at the bedside when the patient signed. Donnajean Lopes provided two copies along with the original, gave unit secretary a copy to place in the patient's file. The patient said she is Catholic. Donnajean Lopes understands the patient misses her community. Donnajean Lopes actively listened as the patient spoke of feeling anxious to be discharge and get back to going to the [facility] and spending time with her friends. Chaplain Noriah Osgood provided hospitality by giving the patient Rosary beads that may give her a sense of comfort and support. Donnajean Lopes prayed at the request of the patient. This note was prepared by Deneen Harts, M.Div..  For questions please contact by phone [phone].

## 2021-07-28 NOTE — Progress Notes (Signed)
CSW received consult for patient. Patients daughter requesting assistance with HCPOA paperwork for patient. CSW paged [facility]. CSW received callback from [facility]. Thayer Ohm confirmed he will go down to see patient and daughter now to assist with questions regarding HCPOA paperwork. All questions answered. No further questions reported at this time.

## 2021-07-28 NOTE — Progress Notes (Signed)
PT Cancellation Note  Patient Details Name: Barbara Mcconnell MRN: [id number] DOB: 1935/07/19   Cancelled Treatment:    Reason Eval/Treat Not Completed: Other (comment) (Pt had procedure this am and is [place] with Bathroom privileges. Pt states doctor told her not to get up today. Nurse has messaged MD regarding activity level and has not heard back. Will return tomorrow.)   [name] 07/28/2021, 12:36 PM Macoy Rodwell M,PT Acute Rehab Services [phone] (pager)

## 2021-07-28 NOTE — Progress Notes (Signed)
Progress Note  Patient Name: Barbara Mcconnell Date of Encounter: 07/28/2021  [facility] Cardiologist: Peter Martinique, MD   Subjective   Patient doing well.  Still somewhat dyspneic at times.  Inpatient Medications    Scheduled Meds:  aspirin EC  81 mg Oral Daily   azithromycin  250 mg Oral Daily   cholecalciferol  1,000 Units Oral Daily   diltiazem  120 mg Oral Daily   docusate sodium  100 mg Oral Q breakfast   escitalopram  5 mg Oral Daily   lidocaine  1 patch Transdermal Q24H   lisinopril  25 mg Oral Daily   loratadine  10 mg Oral Daily   metoprolol tartrate  25 mg Oral BID   montelukast  10 mg Oral QPC supper   pantoprazole  40 mg Oral Daily   sodium chloride flush  3 mL Intravenous Q12H   sodium chloride flush  3 mL Intravenous Q12H   sorbitol, milk of mag, mineral oil, glycerin (SMOG) enema  400 mL Rectal Once   Continuous Infusions:  sodium chloride     sodium chloride     sodium chloride 10 mL/hr at 07/27/21 1016   PRN Meds: sodium chloride, sodium chloride, acetaminophen, acetaminophen, albuterol, Glycerin (Adult), ondansetron (ZOFRAN) IV, sodium chloride flush, sodium chloride flush   Vital Signs    Vitals:   07/27/21 1244 07/27/21 1945 07/28/21 0522 07/28/21 0533  BP: (!) 167/75 (!) 158/85 (!) 186/82   Pulse: 87     Resp: 18 18 18    Temp: 97.8 F (36.6 C) 97.8 F (36.6 C) 97.8 F (36.6 C)   TempSrc: Oral Oral Oral   SpO2:  97% 99%   Weight:    58.8 kg  Height:        Intake/Output Summary (Last 24 hours) at 07/28/2021 0732 Last data filed at 07/28/2021 0400 Gross per 24 hour  Intake 271.84 ml  Output --  Net 271.84 ml   Last 3 Weights 07/28/2021 07/27/2021 07/26/2021  Weight (lbs) 129 lb 11.2 oz 129 lb 8 oz 129 lb 9.6 oz  Weight (kg) 58.832 kg 58.741 kg 58.786 kg      Telemetry    Sinus rhythm - Personally Reviewed  ECG    Not performed  Physical Exam   GEN: No acute distress.   Neck: No JVD Cardiac: RRR, 3/6 SEM Respiratory: Clear to  auscultation bilaterally. GI: Soft, nontender, non-distended  MS: No edema; No deformity. Neuro:  Nonfocal  Psych: Normal affect   Labs    High Sensitivity Troponin:   Recent Labs  Lab 06/30/21 1532 07/21/21 1948 07/21/21 2152 07/22/21 0022 07/22/21 0421  TROPONINIHS 13 63* 56* 51* 48*     Chemistry Recent Labs  Lab 07/21/21 1948 07/22/21 0421 07/23/21 0745 07/23/21 0937 07/23/21 0942 07/23/21 0943 07/24/21 0119  NA 128* 131* 129*   < > 128* 128* 129*  K 4.2 3.9 4.1   < > 3.9 3.8 4.1  CL 93* 93* 93*  --   --   --  93*  CO2 27 28 25   --   --   --  27  GLUCOSE 112* 109* 106*  --   --   --  101*  BUN 20 21 18   --   --   --  22  CREATININE 0.93 1.09* 0.95  --   --   --  1.00  CALCIUM 8.9 9.1 8.9  --   --   --  9.1  MG 2.1  2.2  --   --   --   --   --   PROT 7.1 6.6  --   --   --   --   --   ALBUMIN 3.5 3.2*  --   --   --   --   --   AST 106* 70*  --   --   --   --   --   ALT 121* 99*  --   --   --   --   --   ALKPHOS 205* 175*  --   --   --   --   --   BILITOT 0.8 0.9  --   --   --   --   --   GFRNONAA >60 50* 59*  --   --   --  56*  ANIONGAP 8 10 11   --   --   --  9   < > = values in this interval not displayed.    Lipids No results for input(s): CHOL, TRIG, HDL, LABVLDL, LDLCALC, CHOLHDL in the last 168 hours.  Hematology Recent Labs  Lab 07/21/21 1948 07/22/21 0421 07/23/21 [id number] 07/23/21 0942 07/23/21 0943  WBC 8.4 7.5  --   --   --   RBC 3.88 3.70*  --   --   --   HGB 11.5* 11.3* 10.5* 10.9* 10.5*  HCT 35.6* 33.6* 31.0* 32.0* 31.0*  MCV 91.8 90.8  --   --   --   MCH 29.6 30.5  --   --   --   MCHC 32.3 33.6  --   --   --   RDW 13.4 13.4  --   --   --   PLT 267 275  --   --   --    Thyroid  Recent Labs  Lab 07/22/21 0421  TSH 1.472    BNP Recent Labs  Lab 07/21/21 1948  BNP 201.8*    DDimer  Recent Labs  Lab 07/21/21 1948  DDIMER 1.58*     Radiology    No results found.  Cardiac Studies   Echo   1. Left ventricular ejection  fraction, by estimation, is 60 to 65%. The  left ventricle has normal function. The left ventricle has no regional  wall motion abnormalities. There is moderate concentric left ventricular  hypertrophy. Left ventricular  diastolic parameters are consistent with Grade I diastolic dysfunction  (impaired relaxation). Elevated left ventricular end-diastolic pressure.   2. Right ventricular systolic function is normal. The right ventricular  size is normal. There is normal pulmonary artery systolic pressure.   3. Left atrial size was severely dilated.   4. Pericardial effusion measuring 1.5 cm. Increased compared with 09/2019.  . Moderate pericardial effusion. The pericardial effusion is  circumferential. There is no evidence of cardiac tamponade.   5. The mitral valve is degenerative. Trivial mitral valve regurgitation.  No evidence of mitral stenosis. Moderate mitral annular calcification.   6. AoV not well visualized. Appears calcified. Vmax 3.4 m/s, MG 33 mmHG,  AVA 1.38 cm2, DI 0.29. Moderate aortic stenosis. Mean gradient has  increased from 20 mmHg 09/2019 to 33 mmHg now. The aortic valve is  tricuspid. There is moderate calcification of  the aortic valve. There is moderate thickening of the aortic valve. Aortic  valve regurgitation is mild. Moderate aortic valve stenosis. Aortic valve  area, by VTI measures 1.38 cm. Aortic valve mean gradient measures 33.0  mmHg. Aortic valve  Vmax measures   3.41 m/s.   7. Aortic dilatation noted. There is mild dilatation of the ascending  aorta, measuring 37 mm.   8. The inferior vena cava is dilated in size with >50% respiratory  variability, suggesting right atrial pressure of 8 mmHg.    Cath     Prox RCA lesion is 50% stenosed.   Prox Cx to Mid Cx lesion is 25% stenosed.   Prox LAD to Mid LAD lesion is 25% stenosed.   Hemodynamic findings consistent with pulmonary hypertension.   Stable, mild to moderate coronary artery disease.  No  indication for PCI.   Unable to cross aortic valve, consistent with worsening noted on echocardiogram.  Continue plans for TAVR w/u.   CTA IMPRESSION: 1. Tri leaflet AV with calcium score 2093 and planimetered AVA 1.01 cm2   2.  Annular area of 407 mm2 suitable for a 23 mm Sapien 3 valve   3.  Coronary arteries sufficient height above annulus for deployment   4. Optimal angiographic angle for deployment LAO 20 Caudal 19 degrees   5.  Large circumferential pericardial effusion  Patient Profile     87 y.o. female with PMH of stress induced cardiomyopathy (LV function normalized), moderate to severe AS, pericardial effusion, PVD with CHF.     Assessment & Plan    1. Pericardial effusion:  May be contributing to symptoms.  Pericardiocentesis and drain today.   2. Aortic stenosis:  Appreciate CTS consult.  CTA and cath performed.  Hopefully patient can be discharged prior to TAVR next week.   3.  Carotid artery disease-we will need to arrange carotid Dopplers as she has significant right internal carotid artery disease on CTA.  This will be arranged as an outpatient.   4.  Coronary artery disease-nonobstructive on catheterization.  Continue aspirin.  Statin on hold due to elevated liver functions.   5.  Paroxysmal atrial fibrillation-patient remains in sinus rhythm on telemetry.  Cont Cardizem, now in NSR.  Would plan on holding DOAC and will start after TAVR.   6.   Chronic diastolic congestive heart failure:  continue current therapy, patient is euvolemic on exam today.   7.   Hypertension:  Elevated today, will address after TAVR   8.   Bronchiectasis-Per primary service.     For questions or updates, please contact [place] Please consult [email or web address] for contact info under        Signed, Early Osmond, MD  07/28/2021, 7:32 AM

## 2021-07-28 NOTE — Progress Notes (Signed)
°  Echocardiogram 2D Echocardiogram has been performed.  Barbara Mcconnell 07/28/2021, 10:27 AM

## 2021-07-28 NOTE — Progress Notes (Addendum)
PROGRESS NOTE        PATIENT DETAILS Name: Barbara Mcconnell Age: 87 y.o. Sex: female Date of Birth: 1934/11/09 Admit Date: 07/21/2021 Admitting Physician Toy Baker, MD [id number], Valaria Good., MD  Brief Narrative: Patient is a 87 y.o. female with history of HTN, HLD, moderate AAS, Takotsubo cardiomyopathy-who presented with worsening exertional dyspnea-she was found to have large pericardial effusion, severe AS.  See below for further details  Subjective: Lying comfortably in bed-denies any chest pain.  Mildly short of breath at times but appears comfortable.  Claims to have unchanged exertional dyspnea.  Objective: Vitals: Blood pressure (!) 169/110, pulse 77, temperature 97.8 F (36.6 C), temperature source Tympanic, resp. rate 14, height 5' (1.524 m), weight 58.8 kg, SpO2 100 %.   Exam: Gen Exam:Alert awake-not in any distress HEENT:atraumatic, normocephalic Chest: B/L clear to auscultation anteriorly CVS:S1S2 regular, 3/6 systolic murmur. Abdomen:soft non tender, non distended Extremities:+ edema Neurology: Non focal Skin: no rash  Pertinent Labs/Radiology: Recent Labs  Lab 07/21/21 1948 07/22/21 0421 07/23/21 0745 07/28/21 0716  WBC 8.4 7.5  --  7.2  HGB 11.5* 11.3*   < > 11.4*  PLT 267 275  --  352  NA 128* 131*   < > 125*  K 4.2 3.9   < > 4.3  CREATININE 0.93 1.09*   < > 0.82  AST 106* 70*  --  18  ALT 121* 99*  --  31  ALKPHOS 205* 175*  --  82  BILITOT 0.8 0.9  --  0.7   < > = values in this interval not displayed.    Assessment/Plan: Large pericardial effusion: Cardiology following-pericardiocentesis planned for today.  Severe aortic stenosis: Cardiology/CTS following-CTA/LHC performed-plans are for TAVR next week.  90% right internal carotid artery disease: Cardiology planning on repeating outpatient Dopplers.  On antiplatelet agents- will also require vascular surgery evaluation after TAVR.  Nonobstructive CAD: Continue  ASA-statin on hold due to elevated LFTs.  PAF: Maintaining sinus rhythm-on Cardizem-anticoagulation planned only after TAVR.  Chronic diastolic heart failure: Volume status relatively stable-although has mild lower extremity edema-continue to monitor closely-restart furosemide today given hyponatremia.  Atypical pneumonia: Reviewed outpatient pulmonology note-Zithromax x7 days was planned.  Suspect her shortness of breath was mostly from her cardiac issues rather than PNA.  HTN: BP on the higher side-continue metoprolol, lisinopril and Cardizem.  Restarted furosemide today.  Acute hepatitis A infection (IgM Ab positive): Supportive care-LFTs have now normalized.  Hyponatremia: Asymptomatic-has some lower extremity edema-restart furosemide-and recheck electrolytes tomorrow.  May need to stop Lexapro if sodium levels do not improve  Mood disorder: Stable-continue Lexapro  BMI Estimated body mass index is 25.33 kg/m as calculated from the following:   Height as of this encounter: 5' (1.524 m).   Weight as of this encounter: 58.8 kg.    Procedures: None Consults: Cardiology, cardiothoracic surgery DVT Prophylaxis: SCDs-as pericardiocentesis planned today. Code Status:Full code (per nursing staff on 1/4-does not want to continue DNR status) Family Communication: None at bedside  Time spent: 35 minutes-Greater than 50% of this time was spent in counseling, explanation of diagnosis, planning of further management, and coordination of care.   Disposition Plan: Status is: Inpatient  Remains inpatient appropriate because: Pericardiocentesis plan for large pericardial effusion-still exertional dyspnea-not yet stable for discharge.    Diet: Diet Order  Diet Heart Room service appropriate? Yes; Fluid consistency: Thin  Diet effective now                     Antimicrobial agents: Anti-infectives (From admission, onward)    Start     Dose/Rate Route Frequency  Ordered Stop   07/22/21 1000  azithromycin (ZITHROMAX) tablet 250 mg        250 mg Oral Daily 07/22/21 0012          MEDICATIONS: Scheduled Meds:  aspirin EC  81 mg Oral Daily   azithromycin  250 mg Oral Daily   cholecalciferol  1,000 Units Oral Daily   diltiazem  120 mg Oral Daily   docusate sodium  100 mg Oral Q breakfast   escitalopram  5 mg Oral Daily   lidocaine  1 patch Transdermal Q24H   lisinopril  25 mg Oral Daily   loratadine  10 mg Oral Daily   metoprolol tartrate  25 mg Oral BID   montelukast  10 mg Oral QPC supper   pantoprazole  40 mg Oral Daily   sodium chloride flush  3 mL Intravenous Q12H   sorbitol, milk of mag, mineral oil, glycerin (SMOG) enema  400 mL Rectal Once   Continuous Infusions:  sodium chloride     PRN Meds:.sodium chloride, acetaminophen, acetaminophen, albuterol, Glycerin (Adult), ondansetron (ZOFRAN) IV, sodium chloride flush   I have personally reviewed following labs and imaging studies  LABORATORY DATA: CBC: Recent Labs  Lab 07/21/21 1948 07/22/21 0421 07/23/21 0937 07/23/21 0942 07/23/21 0943 07/28/21 0716  WBC 8.4 7.5  --   --   --  7.2  NEUTROABS 5.8 4.7  --   --   --   --   HGB 11.5* 11.3* 10.5* 10.9* 10.5* 11.4*  HCT 35.6* 33.6* 31.0* 32.0* 31.0* 33.3*  MCV 91.8 90.8  --   --   --  87.6  PLT 267 275  --   --   --  [id number]    Basic Metabolic Panel: Recent Labs  Lab 07/21/21 1948 07/22/21 0022 07/22/21 0421 07/23/21 0745 07/23/21 0937 07/23/21 0942 07/23/21 0943 07/24/21 0119 07/28/21 0716  NA 128*  --  131* 129* 128* 128* 128* 129* 125*  K 4.2  --  3.9 4.1 3.8 3.9 3.8 4.1 4.3  CL 93*  --  93* 93*  --   --   --  93* 90*  CO2 27  --  28 25  --   --   --  27 28  GLUCOSE 112*  --  109* 106*  --   --   --  101* 106*  BUN 20  --  21 18  --   --   --  22 14  CREATININE 0.93  --  1.09* 0.95  --   --   --  1.00 0.82  CALCIUM 8.9  --  9.1 8.9  --   --   --  9.1 8.9  MG 2.1  --  2.2  --   --   --   --   --   --   PHOS   --  3.0 3.4  --   --   --   --   --   --     GFR: Estimated Creatinine Clearance: 41 mL/min (by C-G formula based on SCr of 0.82 mg/dL).  Liver Function Tests: Recent Labs  Lab 07/21/21 1948 07/22/21 0421 07/28/21 0716  AST 106* 70* 18  ALT 121* 99* 31  ALKPHOS 205* 175* 82  BILITOT 0.8 0.9 0.7  PROT 7.1 6.6 5.9*  ALBUMIN 3.5 3.2* 2.7*   No results for input(s): LIPASE, AMYLASE in the last 168 hours. No results for input(s): AMMONIA in the last 168 hours.  Coagulation Profile: Recent Labs  Lab 07/21/21 1948  INR 1.0    Cardiac Enzymes: Recent Labs  Lab 07/22/21 0022  CKTOTAL 49    BNP (last 3 results) No results for input(s): PROBNP in the last 8760 hours.  Lipid Profile: No results for input(s): CHOL, HDL, LDLCALC, TRIG, CHOLHDL, LDLDIRECT in the last 72 hours.  Thyroid Function Tests: No results for input(s): TSH, T4TOTAL, FREET4, T3FREE, THYROIDAB in the last 72 hours.  Anemia Panel: No results for input(s): VITAMINB12, FOLATE, FERRITIN, TIBC, IRON, RETICCTPCT in the last 72 hours.  Urine analysis:    Component Value Date/Time   COLORURINE COLORLESS (A) 07/21/2021 2240   APPEARANCEUR CLEAR 07/21/2021 2240   LABSPEC 1.004 (L) 07/21/2021 2240   PHURINE 7.0 07/21/2021 2240   GLUCOSEU NEGATIVE 07/21/2021 2240   HGBUR NEGATIVE 07/21/2021 2240   BILIRUBINUR NEGATIVE 07/21/2021 2240   KETONESUR NEGATIVE 07/21/2021 2240   PROTEINUR NEGATIVE 07/21/2021 2240   NITRITE NEGATIVE 07/21/2021 2240   LEUKOCYTESUR NEGATIVE 07/21/2021 2240    Sepsis Labs: Lactic Acid, Venous    Component Value Date/Time   LATICACIDVEN 0.7 07/22/2021 2156    MICROBIOLOGY: Recent Results (from the past 240 hour(s))  Resp Panel by RT-PCR (Flu A&B, Covid) Nasopharyngeal Swab     Status: None   Collection Time: 07/21/21  7:48 PM   Specimen: Nasopharyngeal Swab; Nasopharyngeal(NP) swabs in vial transport medium  Result Value Ref Range Status   SARS Coronavirus 2 by RT PCR  NEGATIVE NEGATIVE Final    Comment: (NOTE) SARS-CoV-2 target nucleic acids are NOT DETECTED.  The SARS-CoV-2 RNA is generally detectable in upper respiratory specimens during the acute phase of infection. The lowest concentration of SARS-CoV-2 viral copies this assay can detect is 138 copies/mL. A negative result does not preclude SARS-Cov-2 infection and should not be used as the sole basis for treatment or other patient management decisions. A negative result may occur with  improper specimen collection/handling, submission of specimen other than nasopharyngeal swab, presence of viral mutation(s) within the areas targeted by this assay, and inadequate number of viral copies(<138 copies/mL). A negative result must be combined with clinical observations, patient history, and epidemiological information. The expected result is Negative.  Fact Sheet for Patients:  EntrepreneurPulse.com.au  Fact Sheet for Healthcare Providers:  IncredibleEmployment.be  This test is no t yet approved or cleared by the [place] FDA and  has been authorized for detection and/or diagnosis of SARS-CoV-2 by FDA under an Emergency Use Authorization (EUA). This EUA will remain  in effect (meaning this test can be used) for the duration of the COVID-19 declaration under Section 564(b)(1) of the Act, 21 U.S.C.section 360bbb-3(b)(1), unless the authorization is terminated  or revoked sooner.       Influenza A by PCR NEGATIVE NEGATIVE Final   Influenza B by PCR NEGATIVE NEGATIVE Final    Comment: (NOTE) The Xpert Xpress SARS-CoV-2/FLU/RSV plus assay is intended as an aid in the diagnosis of influenza from Nasopharyngeal swab specimens and should not be used as a sole basis for treatment. Nasal washings and aspirates are unacceptable for Xpert Xpress SARS-CoV-2/FLU/RSV testing.  Fact Sheet for Patients: EntrepreneurPulse.com.au  Fact Sheet for  Healthcare Providers: IncredibleEmployment.be  This test is  not yet approved or cleared by the [place] and has been authorized for detection and/or diagnosis of SARS-CoV-2 by FDA under an Emergency Use Authorization (EUA). This EUA will remain in effect (meaning this test can be used) for the duration of the COVID-19 declaration under Section 564(b)(1) of the Act, 21 U.S.C. section 360bbb-3(b)(1), unless the authorization is terminated or revoked.  Performed at [facility], [place] [address] [place], [place] [address]     RADIOLOGY STUDIES/RESULTS: CARDIAC CATHETERIZATION  Result Date: 07/28/2021 1.  Successful pericardiocentesis and drain placement with evacuation of 400 cc of dark bloody fluid which was sent for analysis; the patient be transferred to the to heart unit for further monitoring.     LOS: 7 days   Oren Binet, MD  [facility]    To contact the attending provider between 7A-7P or the covering provider during after hours 7P-7A, please log into the web site [email or web address] and access using universal [place] password for that web site. If you do not have the password, please call the hospital operator.  07/28/2021, 9:48 AM

## 2021-07-28 NOTE — Progress Notes (Addendum)
[facility]   MULTIDISCIPLINARY HEART VALVE TEAM  Patient Name: Barbara Mcconnell Date of Encounter: 07/28/2021  Primary Cardiologist: Peter Swaziland, MD    Hospital Problem List     Principal Problem:   Pericardial effusion Active Problems:   Essential hypertension   Dyslipidemia, goal LDL below 70   Carotid arterial disease (HCC)   Aortic stenosis   Hypertension   Fluid overload   Bronchiectasis with acute exacerbation (HCC)   Other cirrhosis of liver (HCC)   Subjective   Feeling well today with no specific complaints. Multiple questions regarding timeline for TAVR next week. No chest pain. Continues to have significant SOB with ambulation.   Inpatient Medications    Scheduled Meds:  aspirin EC  81 mg Oral Daily   azithromycin  250 mg Oral Daily   Chlorhexidine Gluconate Cloth  6 each Topical Daily   cholecalciferol  1,000 Units Oral Daily   diltiazem  120 mg Oral Daily   docusate sodium  100 mg Oral Q breakfast   escitalopram  5 mg Oral Daily   furosemide  40 mg Oral Daily   lidocaine  1 patch Transdermal Q24H   lisinopril  25 mg Oral Daily   loratadine  10 mg Oral Daily   metoprolol tartrate  25 mg Oral BID   montelukast  10 mg Oral QPC supper   pantoprazole  40 mg Oral Daily   sodium chloride flush  3 mL Intravenous Q12H   sorbitol, milk of mag, mineral oil, glycerin (SMOG) enema  400 mL Rectal Once   Continuous Infusions:  sodium chloride     PRN Meds: sodium chloride, acetaminophen, acetaminophen, albuterol, Glycerin (Adult), ondansetron (ZOFRAN) IV, sodium chloride flush   Vital Signs    Vitals:   07/28/21 1100 07/28/21 1121 07/28/21 1200 07/28/21 1300  BP: (!) 145/80 137/76 (!) 141/79 104/65  Pulse: 81 82 92 70  Resp: 15 (!) 24 (!) 29 (!) 23  Temp:  97.6 F (36.4 C)    TempSrc:  Oral    SpO2: 95% 94% 94% 97%  Weight:      Height:        Intake/Output Summary (Last 24 hours) at 07/28/2021 1450 Last data filed at 07/28/2021 0400 Gross  per 24 hour  Intake 271.84 ml  Output --  Net 271.84 ml   Filed Weights   07/26/21 0416 07/27/21 0500 07/28/21 0533  Weight: 58.8 kg 58.7 kg 58.8 kg    Physical Exam    General: Well developed, well nourished, NAD Neck: Negative for carotid bruits. No JVD Lungs:Clear to ausculation bilaterally. Breathing is unlabored at rest.  Cardiovascular: RRR with S1 S2. Harsh systolic murmur Abdomen: Soft, non-tender, non-distended. No obvious abdominal masses. Extremities: No edema. Neuro: Alert and oriented. No focal deficits. No facial asymmetry. MAE spontaneously. Psych: Responds to questions appropriately with normal affect.    Labs    CBC Recent Labs    07/28/21 0716  WBC 7.2  HGB 11.4*  HCT 33.3*  MCV 87.6  PLT 352   Basic Metabolic Panel Recent Labs    72/90/21 0716  NA 125*  K 4.3  CL 90*  CO2 28  GLUCOSE 106*  BUN 14  CREATININE 0.82  CALCIUM 8.9   Liver Function Tests Recent Labs    07/28/21 0716  AST 18  ALT 31  ALKPHOS 82  BILITOT 0.7  PROT 5.9*  ALBUMIN 2.7*   No results for input(s): LIPASE, AMYLASE in the last  72 hours. Cardiac Enzymes No results for input(s): CKTOTAL, CKMB, CKMBINDEX, TROPONINI in the last 72 hours. BNP Invalid input(s): POCBNP D-Dimer No results for input(s): DDIMER in the last 72 hours. Hemoglobin A1C No results for input(s): HGBA1C in the last 72 hours. Fasting Lipid Panel No results for input(s): CHOL, HDL, LDLCALC, TRIG, CHOLHDL, LDLDIRECT in the last 72 hours. Thyroid Function Tests No results for input(s): TSH, T4TOTAL, T3FREE, THYROIDAB in the last 72 hours.  Invalid input(s): FREET3  Telemetry    07/29/20 NSR with one brief episode of atrial fibrillation overnight - Personally Reviewed  ECG    No new tracing as of 07/29/20 - Personally Reviewed  Radiology    CARDIAC CATHETERIZATION  Result Date: 07/28/2021 1.  Successful pericardiocentesis and drain placement with evacuation of 400 cc of dark bloody fluid  which was sent for analysis; the patient be transferred to the to heart unit for further monitoring.   ECHOCARDIOGRAM LIMITED  Result Date: 07/28/2021    ECHOCARDIOGRAM LIMITED REPORT   Patient Name:   Barbara Mcconnell Date of Exam: 07/28/2021 Medical Rec #:  [id number]    Height:       60.0 in Accession #:    [id number]   Weight:       129.7 lb Date of Birth:  1934/09/23     BSA:          1.553 m Patient Age:    87 years     BP:           190/87 mmHg Patient Gender: F            HR:           78 bpm. Exam Location:  Inpatient Procedure: Limited Echo, Limited Color Doppler and Cardiac Doppler Indications:     Pericardial effusion I31.3  History:         Patient has prior history of Echocardiogram examinations, most                  recent 07/22/2021. Takotsubo cardiomyopathy, CAD; Risk                  Factors:Dyslipidemia and Hypertension.  Sonographer:     Bernadene Person RDCS Referring Phys:  [id number] Early Osmond Diagnosing Phys: Gwyndolyn Kaufman MD IMPRESSIONS  1. RV free wall not visualized, however, there appears to be only a trivial-to-small pericardial effusion present (best seen posterior to the LV). There is a prominent fat pad.  2. Left ventricular ejection fraction, by estimation, is 60 to 65%. The left ventricle has normal function. The left ventricle has no regional wall motion abnormalities. There is moderate left ventricular hypertrophy.  3. The aortic valve is tricuspid. There is moderate calcification of the aortic valve. There is moderate thickening of the aortic valve.  4. The inferior vena cava is dilated in size with >50% respiratory variability, suggesting right atrial pressure of 8 mmHg. Comparison(s): Compared to prior TTE on 07/22/21, the pericardial effusion is now significantly improved and now appears trivial to small (was previously moderate measuring up to 1.5cm). FINDINGS  Left Ventricle: Left ventricular ejection fraction, by estimation, is 60 to 65%. The left ventricle has normal  function. The left ventricle has no regional wall motion abnormalities. There is moderate left ventricular hypertrophy. Pericardium: A small pericardial effusion is present. Presence of epicardial fat layer. Aortic Valve: The aortic valve is tricuspid. There is moderate calcification of the aortic valve. There is moderate thickening of the aortic valve.  Venous: The inferior vena cava is dilated in size with greater than 50% respiratory variability, suggesting right atrial pressure of 8 mmHg. Laurance Flatten MD Electronically signed by Laurance Flatten MD Signature Date/Time: 07/28/2021/1:13:36 PM    Final (Updated)     Cardiac Studies   Limited echocardiogram 2020-08-08: Pending   Limited Echocardiogram 07/28/20:   1. RV free wall not visualized, however, there appears to be only a  trivial-to-small pericardial effusion present (best seen posterior to the  LV). There is a prominent fat pad.   2. Left ventricular ejection fraction, by estimation, is 60 to 65%. The  left ventricle has normal function. The left ventricle has no regional  wall motion abnormalities. There is moderate left ventricular hypertrophy.   3. The aortic valve is tricuspid. There is moderate calcification of the  aortic valve. There is moderate thickening of the aortic valve.   4. The inferior vena cava is dilated in size with >50% respiratory  variability, suggesting right atrial pressure of 8 mmHg.   Comparison(s): Compared to prior TTE on 07/22/21, the pericardial effusion  is now significantly improved and now appears trivial to small (was  previously moderate measuring up to 1.5cm).   Pericardiocentesis 07/28/20:  Successful pericardiocentesis and drain placement with evacuation of 400 cc of dark bloody fluid which was sent for analysis; the patient be transferred to the to heart unit for further monitoring.  Coronary CT 07/24/21:   IMPRESSION: 1. Tri leaflet AV with calcium score 2093 and planimetered AVA 1.01 cm2    2.  Annular area of 407 mm2 suitable for a 23 mm Sapien 3 valve   3.  Coronary arteries sufficient height above annulus for deployment   4. Optimal angiographic angle for deployment LAO 20 Caudal 19 degrees   5.  Large circumferential pericardial effusion   CT Chest 07/24/21:   IMPRESSION: No evidence of thoracoabdominal aortic aneurysm or dissection.   Peripheral vascular disease with greater than 90% stenosis of the proximal right internal carotid artery. Plaque morphology appears irregular and may be better assessed with dedicated carotid artery Doppler sonography. 50% stenosis of the a dominant left vertebral artery at its origin. 50-75% stenosis of the inferior mesenteric artery at its origin, though this is of questionable clinical significance given patency of the celiac axis and superior mesenteric artery.   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   Cardiomegaly with left ventricular hypertrophy. Extensive calcification of the aortic valve leaflets. Enlarging, moderate pericardial effusion without definite CT evidence of cardiac tamponade. Note, however, small bilateral pleural effusions have developed since prior examination and can be seen the setting of progressive cardiogenic failure. Pericardial fluid appears slightly hyperdense suggesting a proteinaceous or hemorrhagic component.   Cylindrical bronchiectasis and volume loss within the lingula and basilar right middle lobe with associated peribronchial nodularity. This may reflect the sequela of remote inflammation or reflect changes of a chronic infectious process such as atypical mycobacterial or fungal pneumonia.   Multiple subcentimeter arterially enhancing lesions within the liver possibly representing small flash filled hemangioma. These are not optimally characterized on this examination may be better assessed with dedicated MRI examination  if clinically indicated.    R/LHC 07/23/21:     Prox RCA lesion is 50% stenosed.   Prox Cx to Mid Cx lesion is 25% stenosed.   Prox LAD to Mid LAD lesion is 25% stenosed.   Hemodynamic findings consistent with pulmonary hypertension.   Stable, mild to moderate coronary  artery disease.  No indication for PCI.   Unable to cross aortic valve, consistent with worsening noted on echocardiogram.  Continue plans for TAVR w/u.   Echocardiogram 07/22/21:   IMPRESSIONS Left ventricular ejection fraction, by estimation, is 60 to 65%. The left ventricle has normal function. The left ventricle has no regional wall motion abnormalities. There is moderate concentric left ventricular hypertrophy. Left ventricular diastolic parameters are consistent with Grade I diastolic dysfunction (impaired relaxation). Elevated left ventricular end-diastolic pressure. 1.Right ventricular systolic function is normal. The right ventricular size is normal. There is normal pulmonary artery systolic pressure. 2. 3. Left atrial size was severely dilated. Pericardial effusion measuring 1.5 cm. Increased compared with 09/2019. . Moderate pericardial effusion. The pericardial effusion is circumferential. There is no evidence of cardiac tamponade. 4. The mitral valve is degenerative. Trivial mitral valve regurgitation. No evidence of mitral stenosis. Moderate mitral annular calcification. 5. AoV not well visualized. Appears calcified. Vmax 3.4 m/s, MG 33 mmHG, AVA 1.38 cm2, DI 0.29. Moderate aortic stenosis. Mean gradient has increased from 20 mmHg 09/2019 to 33 mmHg now. The aortic valve is tricuspid. There is moderate calcification of the aortic valve. There is moderate thickening of the aortic valve. Aortic valve regurgitation is mild. Moderate aortic valve stenosis. Aortic valve area, by VTI measures 1.38 cm. Aortic valve mean gradient measures 33.0 mmHg. Aortic valve Vmax measures 3.41 m/s. 6. 7. Aortic  dilatation noted. There is mild dilatation of the ascending aorta, measuring 37   Echocardiogram 09/24/2019:   1. Left ventricular ejection fraction, by estimation, is 60 to 65%. The left ventricle has normal function. The left ventricle has no regional wall motion abnormalities. There is severe asymmetric left ventricular hypertrophy of the septal segment (Sigmoid basal septum). Left ventricular diastolic parameters are consistent with Grade II diastolic dysfunction (pseudonormalization). 2. Right ventricular systolic function is normal. The right ventricular size is normal. There is normal pulmonary artery systolic pressure. 3. Left atrial size was mildly dilated. 4. There is mild posterior mitral annular calcification. Mild to moderate mitral valve regurgitation. No evidence of mitral stenosis. 5. The aortic valve is is thickened and moderately calcified with annular calcification. Aortic valve regurgitation is mild to moderate. Moderate to severe aortic valve stenosis. Aortic valve area, by VTI measures 0.85 cm. Aortic valve mean gradient measures 20.5 mmHg. Aortic valve Vmax measures 2.81 m/s. 6. Compared to prior study done is October 2020, there have been improvement in the EF which was 30-35% with wall motion abnormalities. Now EF is 60-65% with no wall motion abnormality. In addition, the Aortic stenosis is now moderate to severe was moderate.   R/LHC 05/13/2019:   Prox RCA lesion is 50% stenosed. There is moderate to severe left ventricular systolic dysfunction. LV end diastolic pressure is moderately elevated. The left ventricular ejection fraction is 35-45% by visual estimate. There is trivial (1+) mitral regurgitation. Hemodynamic findings consistent with moderate pulmonary hypertension.   1. Nonobstructive CAD 2. Moderate to severe LV dysfunction. EF 35-40%. Wall motion abnormality classic for Takotsubo's cardiomyopathy 3. Moderately elevated LV filling pressures 4.  Moderate pulmonary venous HTN 5. Reduced cardiac output. CI 2.3   Plan: patient was SOB in the lab. We administered IV Ntg and lasix in the lab and increased oxygen with improvement in her symptoms and improvement in PA sat from 62% to 66%. Will continue with IV diuresis, continue ACEi and Metoprolol. Echo.  Patient Profile     Barbara Mcconnell is a 87 y.o. female with a hx  of HTN, HLD with statin intolerance, peripheral vascular disease with moderate carotid artery stenosis and hx of Takotsubo cardiomyopathy with normalization who is being seen today for the evaluation of symptomatic, moderate aortic stenosis at the request of Dr. Ali Lowe.  Assessment & Plan    Severe symptomatic stage D aortic stenosis: Patient presented to [facility] with progressive SOB. Echocardiogram showed normal LVEF with a moderate circumfrential pericardial effusion measuring 1.5 cm. There was moderate AS with a mean gradient at 50mmHg (was 20.32mmhg), AVA 1.38cm and DI 0.29. She underwent [facility] 07/23/21 which showed stable, mild to moderate CAD with hemodynamics consistent with pulmonary hypertension. MD unable to cross aortic valve during procedure. TCTS consulted as part of the multidisciplinary valve team who felt that despite low MG with normal AVA, there was a severely calcified and thickened aortic valve with restricted leaflet mobility and given her NYHA class III symptoms and more closely represented stage D severe symptomatic aortic stenosis. CTA showed anatomy suitable for TAVR using a SAPIEN 3 valve. Continues to have significant exertional SOB however improved after pericardiocentesis performed yesterday morning.   Pericardial effusion: Patient found to have large pericardial effusion on pre TAVR CT imaging performed 07/24/21. Underwent pericardiocentesis today with 470ml bloody drainage. 164ml output after procedure and only 35ml overnight. Dr. Stanford Breed pulled drain today without complication. Fluid sent to cytology>>pending  results. Post procedure echo with trivial to small pericardial effusion. Plan for repeat limited echocardiogram today. Will move out of ICU.   Paroxysmal atrial fibrillation: Patient went into AF with RVR on 07/23/21 and was placed on IV diltiazem with quick conversion to NSR. Continues to have very brief recurrent episodes. Dicussed DOAC plan with Dr. Ali Lowe who would like to hold off on [facility] for now. Would benefit from ZIO placement after TAVR to assess AF burden.   Carotid artery disease: Pre TAVR head/neck CTA with significant carotid artery stenosis with plans for surveillance dopplers in the OP setting. May need VVS referral after TAVR. Continue ASA, statin.   Chronic diastolic CHF: Appears euvolemic on exam.    HTN: Improved today, no changes at this time.   Signed, Kathyrn Drown, NP  07/28/2021, 2:50 PM  Pager [phone]  ATTENDING ATTESTATION:  After conducting a review of all available clinical information with the care team, interviewing the patient, and performing a physical exam, I agree with the findings and plan described in this note.   GEN: No acute distress.   Cardiac: RRR, no murmurs, rubs, or gallops.  Respiratory: Clear to auscultation bilaterally. GI: Soft, nontender, non-distended  MS: No edema; No deformity. Neuro:  Nonfocal   Patient doing well.  TTE today with trivial pericardial effusion. Follow up cytology (likely benign), plan for discharge on ASA 325mg  tomorrow and Zio patch after TAVR to assess for AF burden and need for DOAC (had very brief AF < 48 hr duration).  Planning TAVR this coming Tuesday.  Will need vascular surgery consult following tavr re carotid disease  Lenna Sciara, MD Pager [phone]

## 2021-07-28 NOTE — Progress Notes (Signed)
°   07/28/21 1030  Clinical Encounter Type  Visited With Patient;Family  Visit Type Initial;Psychological support;Social support  Referral From Family;Nurse     [facility] visited pt. per page from RN for assistance w/AD.  When [facility] arrived, pt. lying in bed with dtr. Holly at bedside.  Dtr. shared pt. completed an AD when she was living in [place] many years ago but the current copy at home has several editing marks and is unnotarized.  [facility] provided AD education and answered pt. and dtr.'s questions as far as possible.  Pt. verbalizes a desire to have her daughter [name] be MPOA and not to receive life-prolonging measures in any of the situations outlined in Living Will.  Pt. will complete document and have RN page chaplains when/if AD is ready for notarization.  Pt. and family grateful for assistance.  Elpidio Anis, Chaplain Pager: [phone]

## 2021-07-29 ENCOUNTER — Encounter: Payer: Self-pay | Admitting: Cardiology

## 2021-07-29 ENCOUNTER — Inpatient Hospital Stay (HOSPITAL_COMMUNITY): Payer: Medicare HMO

## 2021-07-29 DIAGNOSIS — R0602 Shortness of breath: Secondary | ICD-10-CM

## 2021-07-29 DIAGNOSIS — I3139 Other pericardial effusion (noninflammatory): Secondary | ICD-10-CM

## 2021-07-29 DIAGNOSIS — I350 Nonrheumatic aortic (valve) stenosis: Secondary | ICD-10-CM | POA: Diagnosis not present

## 2021-07-29 LAB — ECHOCARDIOGRAM LIMITED
AV Mean grad: 22 mmHg
AV Peak grad: 37 mmHg
Ao pk vel: 3.04 m/s
Area-P 1/2: 2.73 cm2
Height: 60 in
P 1/2 time: 430 msec
Weight: 2042.34 oz

## 2021-07-29 LAB — BASIC METABOLIC PANEL
Anion gap: 8 (ref 5–15)
BUN: 17 mg/dL (ref 8–23)
CO2: 24 mmol/L (ref 22–32)
Calcium: 8.7 mg/dL — ABNORMAL LOW (ref 8.9–10.3)
Chloride: 93 mmol/L — ABNORMAL LOW (ref 98–111)
Creatinine, Ser: 0.97 mg/dL (ref 0.44–1.00)
GFR, Estimated: 58 mL/min — ABNORMAL LOW (ref >60)
Glucose, Bld: 104 mg/dL — ABNORMAL HIGH (ref 70–99)
Potassium: 3.9 mmol/L (ref 3.5–5.1)
Sodium: 125 mmol/L — ABNORMAL LOW (ref 135–145)

## 2021-07-29 LAB — GLUCOSE, BODY FLUID OTHER: Glucose, Body Fluid Other: 78 mg/dL

## 2021-07-29 LAB — PROTEIN, BODY FLUID (OTHER): Total Protein, Body Fluid Other: 4.9 g/dL

## 2021-07-29 LAB — LD, BODY FLUID (OTHER): LD, Body Fluid: 679 IU/L

## 2021-07-29 LAB — ABO/RH: ABO/RH(D): A POS

## 2021-07-29 MED ORDER — NOREPINEPHRINE 4 MG/250ML-% IV SOLN: 0.0000 ug/min | INTRAVENOUS | Status: DC

## 2021-07-29 MED ORDER — FUROSEMIDE 20 MG PO TABS
20.0000 mg | ORAL_TABLET | Freq: Every day | ORAL | Status: DC
  Administered 2021-07-29 – 2021-07-30 (×2): 20 mg via ORAL
  Filled 2021-07-29 (×2): qty 1

## 2021-07-29 MED ORDER — CALCIUM CARBONATE ANTACID 500 MG PO CHEW: 1.0000 | CHEWABLE_TABLET | Freq: Two times a day (BID) | ORAL | Status: DC | PRN

## 2021-07-29 MED ORDER — FUROSEMIDE 40 MG PO TABS: 40.0000 mg | ORAL_TABLET | Freq: Every day | ORAL | Status: DC

## 2021-07-29 MED ORDER — ALUM & MAG HYDROXIDE-SIMETH 200-200-20 MG/5ML PO SUSP
30.0000 mL | Freq: Four times a day (QID) | ORAL | Status: DC | PRN
  Administered 2021-07-29: 30 mL via ORAL
  Filled 2021-07-29: qty 30

## 2021-07-29 MED ORDER — ASPIRIN 325 MG PO TABS
325.0000 mg | ORAL_TABLET | Freq: Every day | ORAL | Status: DC
  Administered 2021-07-30: 325 mg via ORAL
  Filled 2021-07-29: qty 1

## 2021-07-29 NOTE — Evaluation (Signed)
07/29/2021 PT TAVR Pre-Assessment  HPI: 87 y.o. female sent to the ED by PCP on 12/28 to ED for shortness of breath. 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  Pt reports symptoms of fatigue and SOB with activity. 5 meter walk test produced an average gait speed of 0.45 m/s which indicates increased risk of falls.  Pt would benefit from continued PT in the acute care setting due to the above listed deficits in balance, strength, ROM, endurance and activity tolerance.   5 Meter Walk Test:  Trial 1 11.71 seconds  Trial 2 10.66 seconds  Trial 3 10.82 seconds  3 Trial Average/Gait Speed 11.06 seconds/1.49 ft/sec (<1.8 ft/sec indicates high fall risk)  Comments: Pt ambulated total of 300 ft in hallway, requesting to use RW for her comfort.    West Carbo, PT, DPT   Acute Rehabilitation Department Pager #: [phone]

## 2021-07-29 NOTE — Progress Notes (Signed)
Report given to Diego Cory RN. All pt belongings gathered & pt transferred to 6E22 at this time via wheelchair.

## 2021-07-29 NOTE — Progress Notes (Signed)
Occupational Therapy Treatment/Discharge Patient Details Name: Barbara Mcconnell MRN: [id number] DOB: [birth year] Today's Date: 07/29/2021   History of present illness 87 y.o. female sent to the ED by PCP on 12/28 to ED for shortness of breath. 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   OT comments  Session focused on full AM ADL routine with pt completing bathroom mobility, toileting, bathing and dressing tasks with no more than Setup Assist and no use of AD. Pt determined to fully dress self despite education on lines and likely need for easier access to drain removal site. Pt notably winded during session though vitals stable, encouraged use of shower chair at home. Based on functional presentation, updated DC recs to no OT follow-up. Will discharge OT services at acute level.    Recommendations for follow up therapy are one component of a multi-disciplinary discharge planning process, led by the attending physician.  Recommendations may be updated based on patient status, additional functional criteria and insurance authorization.    Follow Up Recommendations  No OT follow up    Assistance Recommended at Discharge PRN  Patient can return home with the following      Equipment Recommendations  Tub/shower seat    Recommendations for Other Services      Precautions / Restrictions Precautions Precautions: Fall Precaution Comments: monitor HR and sats Restrictions Weight Bearing Restrictions: No       Mobility Bed Mobility Overal bed mobility: Modified Independent                  Transfers Overall transfer level: Independent Equipment used: None Transfers: Sit to/from Stand                   Balance Overall balance assessment:  Needs assistance Sitting-balance support: Feet supported Sitting balance-Leahy Scale: Good     Standing balance support: No upper extremity supported;During functional activity Standing balance-Leahy Scale: Good                             ADL either performed or assessed with clinical judgement   ADL Overall ADL's : Needs assistance/impaired     Grooming: Standing;Independent   Upper Body Bathing: Set up;Sitting   Lower Body Bathing: Set up;Sit to/from stand   Upper Body Dressing : Set up;Sitting   Lower Body Dressing: Set up;Sit to/from stand   Toilet Transfer: Copywriter, advertising;Independent   Toileting- Clothing Manipulation and Hygiene: Sit to/from stand;Independent       Functional mobility during ADLs: Supervision/safety General ADL Comments: Setup only for toileting, full ADLs    Extremity/Trunk Assessment Upper Extremity Assessment Upper Extremity Assessment: Overall WFL for tasks assessed   Lower Extremity Assessment Lower Extremity Assessment: Defer to PT evaluation        Vision   Vision Assessment?: No apparent visual deficits   Perception     Praxis      Cognition Arousal/Alertness: Awake/alert Behavior During Therapy: WFL for tasks assessed/performed;Restless Overall Cognitive Status: Within Functional Limits for tasks assessed                                 General Comments: demanding, declines any advice/education; distractible, questionable memory deficits but WFL. condescending comments throughout  Exercises     Shoulder Instructions       General Comments VSS on RA. Pt adamant about putting on clothing despite OT education on line importance, especially in ICU    Pertinent Vitals/ Pain       Pain Assessment: No/denies pain  Home Living                                          Prior Functioning/Environment              Frequency  Min 2X/week         Progress Toward Goals  OT Goals(current goals can now be found in the care plan section)  Progress towards OT goals: Goals met/education completed, patient discharged from OT  Acute Rehab OT Goals Patient Stated Goal: go home soon OT Goal Formulation: With patient Time For Goal Achievement: 08/07/21 Potential to Achieve Goals: Good ADL Goals Pt/caregiver will Perform Home Exercise Program: Increased ROM;Increased strength;Both right and left upper extremity;With written HEP provided Additional ADL Goal #1: Pt will indep complete all BADLs Additional ADL Goal #2: pt will indep verbalize at least 3 energy conservation strategies to apply in the home setting  Plan Discharge plan needs to be updated;All goals met and education completed, patient discharged from OT services    Co-evaluation                 AM-PAC OT "6 Clicks" Daily Activity     Outcome Measure   Help from another person eating meals?: None Help from another person taking care of personal grooming?: None Help from another person toileting, which includes using toliet, bedpan, or urinal?: A Little Help from another person bathing (including washing, rinsing, drying)?: A Little Help from another person to put on and taking off regular upper body clothing?: None Help from another person to put on and taking off regular lower body clothing?: A Little 6 Click Score: 21    End of Session    OT Visit Diagnosis: Muscle weakness (generalized) (M62.81);Other abnormalities of gait and mobility (R26.89)   Activity Tolerance Patient tolerated treatment well   Patient Left Other (comment) (in bathroom at sink, NT/RN aware)   Nurse Communication Mobility status        Time: 4388-8757 OT Time Calculation (min): 39 min  Charges: OT General Charges $OT Visit: 1 Visit OT Treatments $Self Care/Home Management : 38-52 mins  Malachy Chamber, OTR/L Acute Rehab Services Office: [phone]   Layla Maw 07/29/2021, 9:44  AM

## 2021-07-29 NOTE — TOC Progression Note (Signed)
Transition of Care [facility]) - Progression Note    Patient Details  Name: Barbara Mcconnell MRN: [id number] Date of Birth: 04-06-1935  [facility] CM/SW Contact  Graves-Bigelow, Lamar Laundry, RN Phone Number: 07/29/2021, 12:03 PM  Clinical Narrative: Case Manager received a call from [facility]; the office can accept the patient. Case Manager will continue to follow to see if the patient will need HH prior to discharge before the TAVR.     Expected Discharge Plan: Home w Home Health Services Barriers to Discharge: Continued Medical Work up  Expected Discharge Plan and Services Expected Discharge Plan: Home w Home Health Services In-house Referral: NA Discharge Planning Services: CM Consult Post Acute Care Choice: Home Health Living arrangements for the past 2 months: Single Family Home (town home-one level)                   HH Arranged: PT, OT, Nurse's Aide     Readmission Risk Interventions No flowsheet data found.

## 2021-07-29 NOTE — Progress Notes (Signed)
°  [facility]   MULTIDISCIPLINARY HEART VALVE TEAM    Spoke to patient and primary team about potential for discharge later today. Patient and daughter would prefer for her to stay overnight and obtain pre-admission testing before discharge tomorrow. I have placed PAT orders for tomorrow AM labs. The patient has been given pre-admission TAVR instructions however will update with the new plan so they can have this information before leaving in the morning.    Georgie Chard NP-C Structural Heart Team  Pager: [phone] Phone: [phone]

## 2021-07-29 NOTE — Progress Notes (Signed)
° °  Echocardiogram 2D Echocardiogram has been performed.  Barbara Mcconnell 07/29/2021, 2:12 PM

## 2021-07-29 NOTE — Progress Notes (Incomplete)
{  Select Note:[id number]} 

## 2021-07-29 NOTE — Progress Notes (Signed)
Physical Therapy Treatment Patient Details Name: Barbara Mcconnell MRN: [id number] DOB: 04-18-1935 Today's Date: 07/29/2021   History of Present Illness 87 y.o. female sent to the ED by PCP on 12/28 to ED for shortness of breath. 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    PT Comments    The pt was agreeable to session with focus on progressing ambulation and completing 5MWT for pre-TAVR assessment. The pt completed ~300 ft hallway ambulation with use of RW (requested by pt for her comfort), and completed 5MWT with average time of 11.06sec which is an average gait speed of 0.41ms. The pt will continue to benefit from skilled PT to progress from use of DME and return to prior level of mobility and exercise classes.     Recommendations for follow up therapy are one component of a multi-disciplinary discharge planning process, led by the attending physician.  Recommendations may be updated based on patient status, additional functional criteria and insurance authorization.  Follow Up Recommendations  Home health PT     Assistance Recommended at Discharge Intermittent Supervision/Assistance  Patient can return home with the following A little help with walking and/or transfers;A little help with bathing/dressing/bathroom;Assistance with cooking/housework   Equipment Recommendations  None recommended by PT    Recommendations for Other Services       Precautions / Restrictions Precautions Precautions: Fall Precaution Comments: monitor HR and sats Restrictions Weight Bearing Restrictions: No     Mobility  Bed Mobility Overal bed mobility: Modified Independent             General bed mobility comments: pt OOB in recliner at start and end of  session    Transfers Overall transfer level: Needs assistance Equipment used: Rolling walker (2 wheels) Transfers: Sit to/from Stand Sit to Stand: Min guard           General transfer comment: no cues needed, pt rising with good strength in LE and not needing UE support. prefers RW for comfort    Ambulation/Gait Ambulation/Gait assistance: Min guard Gait Distance (Feet): 300 Feet Assistive device: Rolling walker (2 wheels) Gait Pattern/deviations: Step-through pattern;Decreased stride length;Staggering left;Staggering right Gait velocity: 0.46 m/s Gait velocity interpretation: 1.31 - 2.62 ft/sec, indicative of limited community ambulator   General Gait Details: pt with generally stable gait in hallway, some minor staggering steps, but no overt LOB. pt requesting RW for stability      Balance Overall balance assessment: Needs assistance Sitting-balance support: Feet supported Sitting balance-Leahy Scale: Good     Standing balance support: No upper extremity supported;During functional activity Standing balance-Leahy Scale: Good Standing balance comment: pt asking for RW to avoid a fall, able to stand without need for UE support with good stability                            Cognition Arousal/Alertness: Awake/alert Behavior During Therapy: WFL for tasks assessed/performed;Restless Overall Cognitive Status: Within Functional Limits for tasks assessed                                 General Comments: pt agreeable, tangential at times and benefits from cues to redirect           General Comments General  comments (skin integrity, edema, etc.): pt with times of 11.71s, 10.82s, and 10.66s with 5MWT, average time of 11.0s and average speed of 0.45 m/s      Pertinent Vitals/Pain Pain Assessment: No/denies pain     PT Goals (current goals can now be found in the care plan section) Acute Rehab PT Goals Patient Stated Goal: to go home alone and  feel better PT Goal Formulation: With patient Time For Goal Achievement: 08/06/21 Potential to Achieve Goals: Good Progress towards PT goals: Progressing toward goals    Frequency    Min 3X/week      PT Plan Current plan remains appropriate       AM-PAC PT "6 Clicks" Mobility   Outcome Measure  Help needed turning from your back to your side while in a flat bed without using bedrails?: None Help needed moving from lying on your back to sitting on the side of a flat bed without using bedrails?: None Help needed moving to and from a bed to a chair (including a wheelchair)?: A Little Help needed standing up from a chair using your arms (e.g., wheelchair or bedside chair)?: None Help needed to walk in hospital room?: A Little Help needed climbing 3-5 steps with a railing? : A Little 6 Click Score: 21    End of Session Equipment Utilized During Treatment: Gait belt Activity Tolerance: Patient tolerated treatment well Patient left: in chair;with call bell/phone within reach Nurse Communication: Mobility status PT Visit Diagnosis: Unsteadiness on feet (R26.81);Muscle weakness (generalized) (M62.81)     Time: 3545-6256 PT Time Calculation (min) (ACUTE ONLY): 20 min  Charges:  $Therapeutic Exercise: 8-22 mins                     West Carbo, PT, DPT   Acute Rehabilitation Department Pager #: [phone]   Sandra Cockayne 07/29/2021, 3:59 PM

## 2021-07-29 NOTE — Progress Notes (Addendum)
PROGRESS NOTE        PATIENT DETAILS Name: Barbara Mcconnell Age: 87 y.o. Sex: female Date of Birth: Jun 14, 1935 Admit Date: 07/21/2021 Admitting Physician Toy Baker, MD [id number], Valaria Good., MD  Brief Narrative: Patient is a 87 y.o. female with history of HTN, HLD, moderate AAS, Takotsubo cardiomyopathy-who presented with worsening exertional dyspnea-she was found to have large pericardial effusion, severe AS.  See below for further details  Subjective: Feels better-understands sodium is low but is not keen on taking Lasix.  Pericardial drain removed earlier this morning by cardiology.  Objective: Vitals: Blood pressure 124/68, pulse 87, temperature 97.6 F (36.4 C), temperature source Oral, resp. rate (!) 24, height 5' (1.524 m), weight 57.9 kg, SpO2 99 %.   Exam: Gen Exam:Alert awake-not in any distress HEENT:atraumatic, normocephalic Chest: B/L clear to auscultation anteriorly CVS:S1S2 0000000 systolic murmur. Abdomen:soft non tender, non distended Extremities: Trace edema Neurology: Non focal Skin: no rash   Pertinent Labs/Radiology: Recent Labs  Lab 07/28/21 0716 07/29/21 0124  WBC 7.2  --   HGB 11.4*  --   PLT 352  --   NA 125* 125*  K 4.3 3.9  CREATININE 0.82 0.97  AST 18  --   ALT 31  --   ALKPHOS 82  --   BILITOT 0.7  --      Assessment/Plan: Large pericardial effusion: S/p pericardiocentesis on 1/4-pericardial drain removed today-repeat echo planned per cardiology.  Await pericardial fluid studies.   Severe aortic stenosis: Cardiology/CTS following-CTA/LHC performed-plans are for TAVR next week.  90% right internal carotid artery disease: Cardiology planning on repeating outpatient Dopplers.  On antiplatelet agents- will also require vascular surgery evaluation after TAVR.  Nonobstructive CAD: Continue ASA-statin on hold due to elevated LFTs.  PAF: Maintaining sinus rhythm-on Cardizem-anticoagulation planned only  after TAVR.  Chronic diastolic heart failure: Volume status relatively stable-some improvement in lower extremity edema overnight.  Not keen on taking Lasix-due to urine output.  After extensive discussion-she has agreed to take 20 mg of Lasix.  Watch volume status closely  Atypical pneumonia: Reviewed outpatient pulmonology note-Zithromax x7 days was planned.  Suspect her shortness of breath was mostly from her cardiac issues rather than PNA.  HTN: BP stable-continue metoprolol, lisinopril and Cardizem.    Acute hepatitis A infection (IgM Ab positive): Supportive care-LFTs have now normalized.  Hyponatremia: Asymptomatic-appears to be chronic but worse over the past few days-really not keen on taking furosemide-continue with fluid restriction-after extensive discussion-she is agreed to take 20 mg of furosemide daily.  If low sodium levels continue-May need to stop Lexapro at some point   Mood disorder: Stable-continue Lexapro  BMI Estimated body mass index is 24.93 kg/m as calculated from the following:   Height as of this encounter: 5' (1.524 m).   Weight as of this encounter: 57.9 kg.    Procedures: None Consults: Cardiology, cardiothoracic surgery DVT Prophylaxis: SCDs-as pericardiocentesis planned today. Code Status:Full code (per nursing staff on 1/4-does not want to continue DNR status) Family Communication: None at bedside  Time spent: 25 minutes-Greater than 50% of this time was spent in counseling, explanation of diagnosis, planning of further management, and coordination of care.   Disposition Plan: Status is: Inpatient  Remains inpatient appropriate because: Pericardiocentesis plan for large pericardial effusion-still exertional dyspnea-not yet stable for discharge.    Diet: Diet Order  Diet Heart Room service appropriate? Yes; Fluid consistency: Thin  Diet effective now                     Antimicrobial agents: Anti-infectives (From  admission, onward)    Start     Dose/Rate Route Frequency Ordered Stop   07/22/21 1000  azithromycin (ZITHROMAX) tablet 250 mg        250 mg Oral Daily 07/22/21 0012          MEDICATIONS: Scheduled Meds:  aspirin EC  81 mg Oral Daily   azithromycin  250 mg Oral Daily   Chlorhexidine Gluconate Cloth  6 each Topical Daily   cholecalciferol  1,000 Units Oral Daily   diltiazem  120 mg Oral Daily   docusate sodium  100 mg Oral Q breakfast   escitalopram  5 mg Oral Daily   furosemide  20 mg Oral Daily   lidocaine  1 patch Transdermal Q24H   lisinopril  25 mg Oral Daily   loratadine  10 mg Oral Daily   metoprolol tartrate  25 mg Oral BID   montelukast  10 mg Oral QPC supper   pantoprazole  40 mg Oral Daily   sodium chloride flush  3 mL Intravenous Q12H   sorbitol, milk of mag, mineral oil, glycerin (SMOG) enema  400 mL Rectal Once   Continuous Infusions:  sodium chloride Stopped (07/28/21 2000)   PRN Meds:.sodium chloride, acetaminophen, acetaminophen, albuterol, alum & mag hydroxide-simeth, Glycerin (Adult), ondansetron (ZOFRAN) IV, sodium chloride flush   I have personally reviewed following labs and imaging studies  LABORATORY DATA: CBC: Recent Labs  Lab 07/23/21 0937 07/23/21 0942 07/23/21 0943 07/28/21 0716  WBC  --   --   --  7.2  HGB 10.5* 10.9* 10.5* 11.4*  HCT 31.0* 32.0* 31.0* 33.3*  MCV  --   --   --  87.6  PLT  --   --   --  352     Basic Metabolic Panel: Recent Labs  Lab 07/23/21 0745 07/23/21 0937 07/23/21 0942 07/23/21 0943 07/24/21 0119 07/28/21 0716 07/29/21 0124  NA 129*   < > 128* 128* 129* 125* 125*  K 4.1   < > 3.9 3.8 4.1 4.3 3.9  CL 93*  --   --   --  93* 90* 93*  CO2 25  --   --   --  27 28 24   GLUCOSE 106*  --   --   --  101* 106* 104*  BUN 18  --   --   --  22 14 17   CREATININE 0.95  --   --   --  1.00 0.82 0.97  CALCIUM 8.9  --   --   --  9.1 8.9 8.7*   < > = values in this interval not displayed.     GFR: Estimated  Creatinine Clearance: 34.4 mL/min (by C-G formula based on SCr of 0.97 mg/dL).  Liver Function Tests: Recent Labs  Lab 07/28/21 0716  AST 18  ALT 31  ALKPHOS 82  BILITOT 0.7  PROT 5.9*  ALBUMIN 2.7*    No results for input(s): LIPASE, AMYLASE in the last 168 hours. No results for input(s): AMMONIA in the last 168 hours.  Coagulation Profile: No results for input(s): INR, PROTIME in the last 168 hours.   Cardiac Enzymes: No results for input(s): CKTOTAL, CKMB, CKMBINDEX, TROPONINI in the last 168 hours.   BNP (last 3 results) No results for input(s):  PROBNP in the last 8760 hours.  Lipid Profile: No results for input(s): CHOL, HDL, LDLCALC, TRIG, CHOLHDL, LDLDIRECT in the last 72 hours.  Thyroid Function Tests: No results for input(s): TSH, T4TOTAL, FREET4, T3FREE, THYROIDAB in the last 72 hours.  Anemia Panel: No results for input(s): VITAMINB12, FOLATE, FERRITIN, TIBC, IRON, RETICCTPCT in the last 72 hours.  Urine analysis:    Component Value Date/Time   COLORURINE COLORLESS (A) 07/21/2021 2240   APPEARANCEUR CLEAR 07/21/2021 2240   LABSPEC 1.004 (L) 07/21/2021 2240   PHURINE 7.0 07/21/2021 2240   GLUCOSEU NEGATIVE 07/21/2021 2240   HGBUR NEGATIVE 07/21/2021 2240   BILIRUBINUR NEGATIVE 07/21/2021 2240   KETONESUR NEGATIVE 07/21/2021 2240   PROTEINUR NEGATIVE 07/21/2021 2240   NITRITE NEGATIVE 07/21/2021 2240   LEUKOCYTESUR NEGATIVE 07/21/2021 2240    Sepsis Labs: Lactic Acid, Venous    Component Value Date/Time   LATICACIDVEN 0.7 07/22/2021 2156    MICROBIOLOGY: Recent Results (from the past 240 hour(s))  Resp Panel by RT-PCR (Flu A&B, Covid) Nasopharyngeal Swab     Status: None   Collection Time: 07/21/21  7:48 PM   Specimen: Nasopharyngeal Swab; Nasopharyngeal(NP) swabs in vial transport medium  Result Value Ref Range Status   SARS Coronavirus 2 by RT PCR NEGATIVE NEGATIVE Final    Comment: (NOTE) SARS-CoV-2 target nucleic acids are NOT  DETECTED.  The SARS-CoV-2 RNA is generally detectable in upper respiratory specimens during the acute phase of infection. The lowest concentration of SARS-CoV-2 viral copies this assay can detect is 138 copies/mL. A negative result does not preclude SARS-Cov-2 infection and should not be used as the sole basis for treatment or other patient management decisions. A negative result may occur with  improper specimen collection/handling, submission of specimen other than nasopharyngeal swab, presence of viral mutation(s) within the areas targeted by this assay, and inadequate number of viral copies(<138 copies/mL). A negative result must be combined with clinical observations, patient history, and epidemiological information. The expected result is Negative.  Fact Sheet for Patients:  EntrepreneurPulse.com.au  Fact Sheet for Healthcare Providers:  IncredibleEmployment.be  This test is no t yet approved or cleared by the [place] FDA and  has been authorized for detection and/or diagnosis of SARS-CoV-2 by FDA under an Emergency Use Authorization (EUA). This EUA will remain  in effect (meaning this test can be used) for the duration of the COVID-19 declaration under Section 564(b)(1) of the Act, 21 U.S.C.section 360bbb-3(b)(1), unless the authorization is terminated  or revoked sooner.       Influenza A by PCR NEGATIVE NEGATIVE Final   Influenza B by PCR NEGATIVE NEGATIVE Final    Comment: (NOTE) The Xpert Xpress SARS-CoV-2/FLU/RSV plus assay is intended as an aid in the diagnosis of influenza from Nasopharyngeal swab specimens and should not be used as a sole basis for treatment. Nasal washings and aspirates are unacceptable for Xpert Xpress SARS-CoV-2/FLU/RSV testing.  Fact Sheet for Patients: EntrepreneurPulse.com.au  Fact Sheet for Healthcare Providers: IncredibleEmployment.be  This test is not yet  approved or cleared by the [place] FDA and has been authorized for detection and/or diagnosis of SARS-CoV-2 by FDA under an Emergency Use Authorization (EUA). This EUA will remain in effect (meaning this test can be used) for the duration of the COVID-19 declaration under Section 564(b)(1) of the Act, 21 U.S.C. section 360bbb-3(b)(1), unless the authorization is terminated or revoked.  Performed at [facility], [place] [address] [place], [place] [address]   Body fluid culture w  Gram Stain     Status: None (Preliminary result)   Collection Time: 07/28/21  8:15 AM   Specimen: PATH Cytology Misc. fluid; Body Fluid  Result Value Ref Range Status   Specimen Description FLUID  Final   Special Requests NONE  Final   Gram Stain NO WBC SEEN NO ORGANISMS SEEN   Final   Culture   Final    NO GROWTH 1 DAY Performed at [facility], [address] [place], [place] [address]    Report Status PENDING  Incomplete    RADIOLOGY STUDIES/RESULTS: CARDIAC CATHETERIZATION  Result Date: 07/28/2021 1.  Successful pericardiocentesis and drain placement with evacuation of 400 cc of dark bloody fluid which was sent for analysis; the patient be transferred to the to heart unit for further monitoring.   ECHOCARDIOGRAM LIMITED  Result Date: 07/28/2021    ECHOCARDIOGRAM LIMITED REPORT   Patient Name:   JANALYN BURNE Date of Exam: 07/28/2021 Medical Rec #:  [id number]    Height:       60.0 in Accession #:    [id number]   Weight:       129.7 lb Date of Birth:  1934/08/04     BSA:          1.553 m Patient Age:    89 years     BP:           190/87 mmHg Patient Gender: F            HR:           78 bpm. Exam Location:  Inpatient Procedure: Limited Echo, Limited Color Doppler and Cardiac Doppler Indications:     Pericardial effusion I31.3  History:         Patient has prior history of Echocardiogram examinations, most                  recent 07/22/2021. Takotsubo cardiomyopathy, CAD; Risk                   Factors:Dyslipidemia and Hypertension.  Sonographer:     Bernadene Person RDCS Referring Phys:  [id number] Early [place] Diagnosing Phys: Gwyndolyn Kaufman MD IMPRESSIONS  1. RV free wall not visualized, however, there appears to be only a trivial-to-small pericardial effusion present (best seen posterior to the LV). There is a prominent fat pad.  2. Left ventricular ejection fraction, by estimation, is 60 to 65%. The left ventricle has normal function. The left ventricle has no regional wall motion abnormalities. There is moderate left ventricular hypertrophy.  3. The aortic valve is tricuspid. There is moderate calcification of the aortic valve. There is moderate thickening of the aortic valve.  4. The inferior vena cava is dilated in size with >50% respiratory variability, suggesting right atrial pressure of 8 mmHg. Comparison(s): Compared to prior TTE on 07/22/21, the pericardial effusion is now significantly improved and now appears trivial to small (was previously moderate measuring up to 1.5cm). FINDINGS  Left Ventricle: Left ventricular ejection fraction, by estimation, is 60 to 65%. The left ventricle has normal function. The left ventricle has no regional wall motion abnormalities. There is moderate left ventricular hypertrophy. Pericardium: A small pericardial effusion is present. Presence of epicardial fat layer. Aortic Valve: The aortic valve is tricuspid. There is moderate calcification of the aortic valve. There is moderate thickening of the aortic valve. Venous: The inferior vena cava is dilated in size with greater than 50% respiratory variability, suggesting right atrial pressure of 8  mmHg. Gwyndolyn Kaufman MD Electronically signed by Gwyndolyn Kaufman MD Signature Date/Time: 07/28/2021/1:13:36 PM    Final (Updated)      LOS: 8 days   Oren Binet, MD  [facility]    To contact the attending provider between 7A-7P or the covering provider during after hours 7P-7A, please  log into the web site [email or web address] and access using universal [place] password for that web site. If you do not have the password, please call the hospital operator.  07/29/2021, 11:56 AM

## 2021-07-29 NOTE — Progress Notes (Signed)
Patient arrived on unit via wheelchair, accompanied by daughter and nurse. Patient transferred to bed without difficulty. Patient oriented to room and unit. Nad noted at this time. Will continue to monitor, call bell within reach.

## 2021-07-29 NOTE — Progress Notes (Signed)
Pericardial drain removed at bedside by Dr. Jens Som without complications.

## 2021-07-29 NOTE — Progress Notes (Signed)
Progress Note  Patient Name: Barbara Mcconnell Date of Encounter: 07/29/2021  [facility] Cardiologist: Peter Martinique, MD   Subjective   Pt  denies CP; some improvement in dyspnea  Inpatient Medications    Scheduled Meds:  aspirin EC  81 mg Oral Daily   azithromycin  250 mg Oral Daily   Chlorhexidine Gluconate Cloth  6 each Topical Daily   cholecalciferol  1,000 Units Oral Daily   diltiazem  120 mg Oral Daily   docusate sodium  100 mg Oral Q breakfast   escitalopram  5 mg Oral Daily   furosemide  40 mg Oral Daily   lidocaine  1 patch Transdermal Q24H   lisinopril  25 mg Oral Daily   loratadine  10 mg Oral Daily   metoprolol tartrate  25 mg Oral BID   montelukast  10 mg Oral QPC supper   pantoprazole  40 mg Oral Daily   sodium chloride flush  3 mL Intravenous Q12H   sorbitol, milk of mag, mineral oil, glycerin (SMOG) enema  400 mL Rectal Once   Continuous Infusions:  sodium chloride Stopped (07/28/21 2000)   PRN Meds: sodium chloride, acetaminophen, acetaminophen, albuterol, alum & mag hydroxide-simeth, Glycerin (Adult), ondansetron (ZOFRAN) IV, sodium chloride flush   Vital Signs    Vitals:   07/29/21 0400 07/29/21 0500 07/29/21 0600 07/29/21 0700  BP: 122/74 127/84 124/73 128/76  Pulse:      Resp: 16 (!) 23 20 20   Temp: 97.7 F (36.5 C)     TempSrc: Oral     SpO2: 100% 97% 97% 97%  Weight:  57.9 kg    Height:        Intake/Output Summary (Last 24 hours) at 07/29/2021 0728 Last data filed at 07/29/2021 0700 Gross per 24 hour  Intake 60 ml  Output 155 ml  Net -95 ml   Last 3 Weights 07/29/2021 07/28/2021 07/27/2021  Weight (lbs) 127 lb 10.3 oz 129 lb 11.2 oz 129 lb 8 oz  Weight (kg) 57.9 kg 58.832 kg 58.741 kg      Telemetry    Sinus with paroxysmal atrial flutter- Personally Reviewed  Physical Exam   GEN: No acute distress.  WD Neck: supple Cardiac: RRR, 2/6 systolic murmur, no DM Respiratory: CTA GI: Soft, NT, ND MS: trace edema Neuro:  Grossly  intact Psych: Normal affect   Labs    High Sensitivity Troponin:   Recent Labs  Lab 06/30/21 1532 07/21/21 1948 07/21/21 2152 07/22/21 0022 07/22/21 0421  TROPONINIHS 13 63* 56* 51* 48*      Chemistry Recent Labs  Lab 07/24/21 0119 07/28/21 0716 07/29/21 0124  NA 129* 125* 125*  K 4.1 4.3 3.9  CL 93* 90* 93*  CO2 27 28 24   GLUCOSE 101* 106* 104*  BUN 22 14 17   CREATININE 1.00 0.82 0.97  CALCIUM 9.1 8.9 8.7*  PROT  --  5.9*  --   ALBUMIN  --  2.7*  --   AST  --  18  --   ALT  --  31  --   ALKPHOS  --  82  --   BILITOT  --  0.7  --   GFRNONAA 56* >60 58*  ANIONGAP 9 7 8       Hematology Recent Labs  Lab 07/23/21 0942 07/23/21 0943 07/28/21 0716  WBC  --   --  7.2  RBC  --   --  3.80*  HGB 10.9* 10.5* 11.4*  HCT 32.0* 31.0* 33.3*  MCV  --   --  87.6  MCH  --   --  30.0  MCHC  --   --  34.2  RDW  --   --  12.9  PLT  --   --  352     Echo   1. Left ventricular ejection fraction, by estimation, is 60 to 65%. The  left ventricle has normal function. The left ventricle has no regional  wall motion abnormalities. There is moderate concentric left ventricular  hypertrophy. Left ventricular  diastolic parameters are consistent with Grade I diastolic dysfunction  (impaired relaxation). Elevated left ventricular end-diastolic pressure.   2. Right ventricular systolic function is normal. The right ventricular  size is normal. There is normal pulmonary artery systolic pressure.   3. Left atrial size was severely dilated.   4. Pericardial effusion measuring 1.5 cm. Increased compared with 09/2019.  . Moderate pericardial effusion. The pericardial effusion is  circumferential. There is no evidence of cardiac tamponade.   5. The mitral valve is degenerative. Trivial mitral valve regurgitation.  No evidence of mitral stenosis. Moderate mitral annular calcification.   6. AoV not well visualized. Appears calcified. Vmax 3.4 m/s, MG 33 mmHG,  AVA 1.38 cm2, DI 0.29.  Moderate aortic stenosis. Mean gradient has  increased from 20 mmHg 09/2019 to 33 mmHg now. The aortic valve is  tricuspid. There is moderate calcification of  the aortic valve. There is moderate thickening of the aortic valve. Aortic  valve regurgitation is mild. Moderate aortic valve stenosis. Aortic valve  area, by VTI measures 1.38 cm. Aortic valve mean gradient measures 33.0  mmHg. Aortic valve Vmax measures   3.41 m/s.   7. Aortic dilatation noted. There is mild dilatation of the ascending  aorta, measuring 37 mm.   8. The inferior vena cava is dilated in size with >50% respiratory  variability, suggesting right atrial pressure of 8 mmHg.   Cath    Prox RCA lesion is 50% stenosed.   Prox Cx to Mid Cx lesion is 25% stenosed.   Prox LAD to Mid LAD lesion is 25% stenosed.   Hemodynamic findings consistent with pulmonary hypertension.   Stable, mild to moderate coronary artery disease.  No indication for PCI.   Unable to cross aortic valve, consistent with worsening noted on echocardiogram.  Continue plans for TAVR w/u.  CTA IMPRESSION: 1. Tri leaflet AV with calcium score 2093 and planimetered AVA 1.01 cm2   2.  Annular area of 407 mm2 suitable for a 23 mm Sapien 3 valve   3.  Coronary arteries sufficient height above annulus for deployment   4. Optimal angiographic angle for deployment LAO 20 Caudal 19 degrees   5.  Large circumferential pericardial effusion  Patient Profile     87 y.o. female with PMH of stress induced cardiomyopathy (LV function normalized), moderate to severe AS, pericardial effusion, PVD with CHF.   Assessment & Plan    1 pericardial effusion-patient is status post pericardiocentesis yesterday.  Cytology is pending.  5 cc out over the past 12 hours.  Drain has been removed.  We will arrange follow-up limited echocardiogram this morning.  2 aortic stenosis-plan is for TAVR on Tuesday.  3 carotid artery disease-significant right internal carotid  artery disease noted on CTA.  We will arrange follow-up carotid Dopplers as an outpatient.  4 coronary artery disease-nonobstructive on catheterization.  Continue aspirin.  Statin on hold due to elevated liver functions.  5 paroxysmal atrial fibrillation/flutter-patient with transient atrial  flutter on telemetry now in sinus rhythm.  We will continue Cardizem and metoprolol.  Apixaban on hold status post pericardiocentesis.  We will not resume as she is scheduled for TAVR on Tuesday (resume after procedure).  6 chronic diastolic congestive heart failure-we will decrease Lasix to 20 mg daily.  7 hypertension-blood pressure controlled.  Continue present medications.  8 bronchiectasis-Per primary service.  9 hyponatremia-sodium 125.  Decrease Lasix to 20 mg daily.  Would fluid restrict.  Further management per primary service.  For questions or updates, please contact [place] Please consult [email or web address] for contact info under        Signed, Kirk Ruths, MD  07/29/2021, 7:28 AM

## 2021-07-30 ENCOUNTER — Other Ambulatory Visit (HOSPITAL_COMMUNITY): Payer: Self-pay

## 2021-07-30 DIAGNOSIS — I350 Nonrheumatic aortic (valve) stenosis: Secondary | ICD-10-CM | POA: Diagnosis not present

## 2021-07-30 DIAGNOSIS — E871 Hypo-osmolality and hyponatremia: Secondary | ICD-10-CM

## 2021-07-30 LAB — URINALYSIS, COMPLETE (UACMP) WITH MICROSCOPIC
Bilirubin Urine: NEGATIVE
Glucose, UA: NEGATIVE mg/dL
Hgb urine dipstick: NEGATIVE
Ketones, ur: NEGATIVE mg/dL
Leukocytes,Ua: NEGATIVE
Nitrite: NEGATIVE
Protein, ur: 30 mg/dL — AB
Specific Gravity, Urine: 1.027 (ref 1.005–1.030)
pH: 5 (ref 5.0–8.0)

## 2021-07-30 LAB — CBC
HCT: 34 % — ABNORMAL LOW (ref 36.0–46.0)
Hemoglobin: 11.4 g/dL — ABNORMAL LOW (ref 12.0–15.0)
MCH: 29.7 pg (ref 26.0–34.0)
MCHC: 33.5 g/dL (ref 30.0–36.0)
MCV: 88.5 fL (ref 80.0–100.0)
Platelets: 394 10*3/uL (ref 150–400)
RBC: 3.84 MIL/uL — ABNORMAL LOW (ref 3.87–5.11)
RDW: 13.1 % (ref 11.5–15.5)
WBC: 8.9 10*3/uL (ref 4.0–10.5)
nRBC: 0 % (ref 0.0–0.2)

## 2021-07-30 LAB — COMPREHENSIVE METABOLIC PANEL
ALT: 22 U/L (ref 0–44)
AST: 15 U/L (ref 15–41)
Albumin: 2.6 g/dL — ABNORMAL LOW (ref 3.5–5.0)
Alkaline Phosphatase: 66 U/L (ref 38–126)
Anion gap: 8 (ref 5–15)
BUN: 20 mg/dL (ref 8–23)
CO2: 26 mmol/L (ref 22–32)
Calcium: 8.7 mg/dL — ABNORMAL LOW (ref 8.9–10.3)
Chloride: 91 mmol/L — ABNORMAL LOW (ref 98–111)
Creatinine, Ser: 1.06 mg/dL — ABNORMAL HIGH (ref 0.44–1.00)
GFR, Estimated: 52 mL/min — ABNORMAL LOW (ref >60)
Glucose, Bld: 99 mg/dL (ref 70–99)
Potassium: 4 mmol/L (ref 3.5–5.1)
Sodium: 125 mmol/L — ABNORMAL LOW (ref 135–145)
Total Bilirubin: 0.4 mg/dL (ref 0.3–1.2)
Total Protein: 5.7 g/dL — ABNORMAL LOW (ref 6.5–8.1)

## 2021-07-30 LAB — BLOOD GAS, ARTERIAL
Acid-Base Excess: 2.6 mmol/L — ABNORMAL HIGH (ref 0.0–2.0)
Bicarbonate: 26.7 mmol/L (ref 20.0–28.0)
Drawn by: 345601
FIO2: 21
O2 Saturation: 94.6 %
Patient temperature: 37
pCO2 arterial: 41.6 mmHg (ref 32.0–48.0)
pH, Arterial: 7.423 (ref 7.350–7.450)
pO2, Arterial: 72.5 mmHg — ABNORMAL LOW (ref 83.0–108.0)

## 2021-07-30 LAB — BRAIN NATRIURETIC PEPTIDE: B Natriuretic Peptide: 194.4 pg/mL — ABNORMAL HIGH (ref 0.0–100.0)

## 2021-07-30 LAB — PROTIME-INR
INR: 1.1 (ref 0.8–1.2)
Prothrombin Time: 13.8 seconds (ref 11.4–15.2)

## 2021-07-30 LAB — TYPE AND SCREEN
ABO/RH(D): A POS
Antibody Screen: NEGATIVE

## 2021-07-30 MED ORDER — PREDNISONE 50 MG PO TABS
ORAL_TABLET | ORAL | 0 refills | Status: DC
  Filled 2021-07-30: qty 2, 1d supply, fill #0

## 2021-07-30 MED ORDER — METOPROLOL TARTRATE 25 MG PO TABS
25.0000 mg | ORAL_TABLET | Freq: Two times a day (BID) | ORAL | 2 refills | Status: DC
  Filled 2021-07-30: qty 60, 30d supply, fill #0

## 2021-07-30 MED ORDER — DILTIAZEM HCL ER COATED BEADS 120 MG PO CP24
120.0000 mg | ORAL_CAPSULE | Freq: Every day | ORAL | 2 refills | Status: DC
  Filled 2021-07-30: qty 30, 30d supply, fill #0

## 2021-07-30 MED ORDER — ASPIRIN 325 MG PO TABS
325.0000 mg | ORAL_TABLET | Freq: Every day | ORAL | 2 refills | Status: DC
  Filled 2021-07-30: qty 30, 30d supply, fill #0

## 2021-07-30 NOTE — TOC Progression Note (Addendum)
Transition of Care [facility]) - Progression Note    Patient Details  Name: Barbara Mcconnell MRN: [id number] Date of Birth: 10-05-1934  [facility] CM/SW Contact  Graves-Bigelow, Ocie Cornfield, RN Phone Number: 07/30/2021, 11:14 AM  Clinical Narrative:  Case Manager spoke with patient and daughter Earnest Bailey regarding home health. No home health will be arranged at this time. Patient will return for TAVR next Tuesday. Daughter Earnest Bailey will then see if patient will need home health as the patient progresses post procedure. [place] will be following the patient for home needs PT/OT/Aide just in case. Daughter had concerns about the patient having assistance in the home. Case Manager provided information to the daughter regarding personal care services. Daughter had questions regarding personal care and Case Manager made the [facility] liaison aware. Liaison of [facility] to call the daughter to discuss a plan of care. No further needs from Case Manager at this time.   Expected Discharge Plan: Home with Self Care Barriers to discharge: No Barriers  Expected Discharge Plan and Services Expected Discharge Plan: [place] In-house Referral: NA Discharge Planning Services: CM Consult Post Acute Care Choice: [place] arrangements for the past 2 months: [place] (town home-one level) Expected Discharge Date: 07/30/21                         HH Arranged: PT, OT, Nurse's Aide    Readmission Risk Interventions No flowsheet data found.

## 2021-07-30 NOTE — Progress Notes (Signed)
Patient's daughter expressed concern about Lexapro had been discontinued. Notified MD. Explained to daughter why it had been discontinued. Daughter still not happy.

## 2021-07-30 NOTE — Progress Notes (Addendum)
[place] VALVE TEAM  Patient Name: Barbara Mcconnell Date of Encounter: 07/30/2021  Primary Cardiologist: Peter Martinique, MD   Hospital Problem List     Principal Problem:   Pericardial effusion Active Problems:   Essential hypertension   Dyslipidemia, goal LDL below 70   Carotid arterial disease (HCC)   Aortic stenosis   Hypertension   Fluid overload   Bronchiectasis with acute exacerbation (HCC)   Other cirrhosis of liver (HCC)   Hyponatremia   Subjective   Doing well today. Anticipate discharge today. Reviewed TAVR instructions with patient and daughter today. Pre TAVR labs obtained this AM.   Inpatient Medications    Scheduled Meds:  aspirin  325 mg Oral Daily   azithromycin  250 mg Oral Daily   Chlorhexidine Gluconate Cloth  6 each Topical Daily   cholecalciferol  1,000 Units Oral Daily   diltiazem  120 mg Oral Daily   docusate sodium  100 mg Oral Q breakfast   escitalopram  5 mg Oral Daily   furosemide  20 mg Oral Daily   lidocaine  1 patch Transdermal Q24H   lisinopril  25 mg Oral Daily   loratadine  10 mg Oral Daily   metoprolol tartrate  25 mg Oral BID   montelukast  10 mg Oral QPC supper   pantoprazole  40 mg Oral Daily   sodium chloride flush  3 mL Intravenous Q12H   sorbitol, milk of mag, mineral oil, glycerin (SMOG) enema  400 mL Rectal Once   Continuous Infusions:  sodium chloride Stopped (07/28/21 2000)   PRN Meds: sodium chloride, acetaminophen, acetaminophen, albuterol, alum & mag hydroxide-simeth, Glycerin (Adult), ondansetron (ZOFRAN) IV, sodium chloride flush   Vital Signs    Vitals:   07/29/21 1800 07/29/21 2100 07/30/21 0538 07/30/21 0801  BP:  111/60 138/79 (!) 106/54  Pulse:  78 75 86  Resp: 20 20 20 20   Temp:  97.7 F (36.5 C) 97.9 F (36.6 C) 97.9 F (36.6 C)  TempSrc:  Oral Oral Oral  SpO2:  100% 100%   Weight:  56.1 kg 56.1 kg   Height:        Intake/Output Summary (Last 24 hours)  at 07/30/2021 1004 Last data filed at 07/29/2021 2111 Gross per 24 hour  Intake 120 ml  Output --  Net 120 ml   Filed Weights   07/29/21 0500 07/29/21 2100 07/30/21 0538  Weight: 57.9 kg 56.1 kg 56.1 kg    Physical Exam    General: Well developed, well nourished, NAD Neck: Negative for carotid bruits. No JVD Lungs:Clear to ausculation bilaterally. Breathing is unlabored at rest Cardiovascular: RRR with S1 S2. Harsh systolic murmur. Abdomen: Soft, non-tender, non-distended. No obvious abdominal masses. Extremities: No edema. Neuro: Alert and oriented. No focal deficits. No facial asymmetry. MAE spontaneously. Psych: Responds to questions appropriately with normal affect.    Labs    CBC Recent Labs    07/28/21 0716 07/30/21 0128  WBC 7.2 8.9  HGB 11.4* 11.4*  HCT 33.3* 34.0*  MCV 87.6 88.5  PLT 352 [id number]   Basic Metabolic Panel Recent Labs    07/29/21 0124 07/30/21 0128  NA 125* 125*  K 3.9 4.0  CL 93* 91*  CO2 24 26  GLUCOSE 104* 99  BUN 17 20  CREATININE 0.97 1.06*  CALCIUM 8.7* 8.7*   Liver Function Tests Recent Labs    07/28/21 0716 07/30/21 0128  AST 18 15  ALT  31 22  ALKPHOS 82 66  BILITOT 0.7 0.4  PROT 5.9* 5.7*  ALBUMIN 2.7* 2.6*   No results for input(s): LIPASE, AMYLASE in the last 72 hours. Cardiac Enzymes No results for input(s): CKTOTAL, CKMB, CKMBINDEX, TROPONINI in the last 72 hours. BNP Invalid input(s): POCBNP D-Dimer No results for input(s): DDIMER in the last 72 hours. Hemoglobin A1C No results for input(s): HGBA1C in the last 72 hours. Fasting Lipid Panel No results for input(s): CHOL, HDL, LDLCALC, TRIG, CHOLHDL, LDLDIRECT in the last 72 hours. Thyroid Function Tests No results for input(s): TSH, T4TOTAL, T3FREE, THYROIDAB in the last 72 hours.  Invalid input(s): FREET3  Telemetry    07/30/20 NSR - Personally Reviewed  ECG    No new tracing as of 07/30/20 - Personally Reviewed  Radiology    ECHOCARDIOGRAM  LIMITED  Result Date: 07/29/2021    ECHOCARDIOGRAM LIMITED REPORT   Patient Name:   MONIQUE JESPERSON Date of Exam: 07/29/2021 Medical Rec #:  [id number]    Height:       60.0 in Accession #:    [id number]   Weight:       127.6 lb Date of Birth:  1935/01/31     BSA:          1.542 m Patient Age:    86 years     BP:           139/93 mmHg Patient Gender: F            HR:           58 bpm. Exam Location:  Inpatient Procedure: Limited Echo, Limited Color Doppler and Cardiac Doppler Indications:    pericardial effusion  History:        Patient has prior history of Echocardiogram examinations, most                 recent 07/28/2021. CAD, Aortic Valve Disease; Risk                 Factors:Hypertension and Dyslipidemia.  Sonographer:    Delcie Roch RDCS Referring Phys: [id number] BRIAN S CRENSHAW IMPRESSIONS  1. Left ventricular ejection fraction, by estimation, is 60 to 65%. The left ventricle has normal function.  2. Right ventricular systolic function is normal. The right ventricular size is normal. There is normal pulmonary artery systolic pressure.  3. Pericardial effusion, overall small Largest along inferior surface of RV/RA,particularly at sulcus. Compared to echo from 07/28/21, no significant change No evidence of hemodynamic compromise by echo criteria.  4. Mild mitral valve regurgitation.  5. AV is thickened, calcified with restricted motion. Peak and mean gradients through the valve are 37 and 22 mm Hg respectively.. The aortic valve is tricuspid. Aortic valve regurgitation is mild.  6. The inferior vena cava is normal in size with greater than 50% respiratory variability, suggesting right atrial pressure of 3 mmHg. FINDINGS  Left Ventricle: Left ventricular ejection fraction, by estimation, is 60 to 65%. The left ventricle has normal function. Right Ventricle: The right ventricular size is normal. Right ventricular systolic function is normal. There is normal pulmonary artery systolic pressure. The tricuspid regurgitant  velocity is 2.42 m/s, and with an assumed right atrial pressure of 3 mmHg,  the estimated right ventricular systolic pressure is 26.4 mmHg. Left Atrium: Left atrial size was normal in size. Right Atrium: Right atrial size was normal in size. Pericardium: Pericardial effusion, overall small Largest along inferior surface of RV/RA,particularly at sulcus. Compared to echo from 07/28/21,  no significant change No evidence of hemodynamic compromise by echo criteria. Mitral Valve: There is mild thickening of the mitral valve leaflet(s). Mild to moderate mitral annular calcification. Mild mitral valve regurgitation. Tricuspid Valve: The tricuspid valve is normal in structure. Tricuspid valve regurgitation is trivial. Aortic Valve: AV is thickened, calcified with restricted motion. Peak and mean gradients through the valve are 37 and 22 mm Hg respectively. The aortic valve is tricuspid. Aortic valve regurgitation is mild. Aortic regurgitation PHT measures 430 msec. Aortic valve mean gradient measures 22.0 mmHg. Aortic valve peak gradient measures 37.0 mmHg. Pulmonic Valve: The pulmonic valve was grossly normal. Aorta: The aortic root is normal in size and structure. Venous: The inferior vena cava is normal in size with greater than 50% respiratory variability, suggesting right atrial pressure of 3 mmHg. IAS/Shunts: No atrial level shunt detected by color flow Doppler. IVC IVC diam: 1.20 cm AORTIC VALVE AV Vmax:      304.00 cm/s AV Vmean:     215.000 cm/s AV VTI:       0.602 m AV Peak Grad: 37.0 mmHg AV Mean Grad: 22.0 mmHg AI PHT:       430 msec MITRAL VALVE                TRICUSPID VALVE MV Area (PHT): 2.73 cm     TR Peak grad:   23.4 mmHg MV Decel Time: 278 msec     TR Vmax:        242.00 cm/s MV E velocity: 107.00 cm/s MV A velocity: 143.00 cm/s MV E/A ratio:  0.75 Dorris Carnes MD Electronically signed by Dorris Carnes MD Signature Date/Time: 07/29/2021/5:52:51 PM    Final    ECHOCARDIOGRAM LIMITED  Result Date: 07/28/2021     ECHOCARDIOGRAM LIMITED REPORT   Patient Name:   AGUSTIN BARCZAK Date of Exam: 07/28/2021 Medical Rec #:  [id number]    Height:       60.0 in Accession #:    [id number]   Weight:       129.7 lb Date of Birth:  [birth year]     BSA:          1.553 m Patient Age:    35 years     BP:           190/87 mmHg Patient Gender: F            HR:           78 bpm. Exam Location:  Inpatient Procedure: Limited Echo, Limited Color Doppler and Cardiac Doppler Indications:     Pericardial effusion I31.3  History:         Patient has prior history of Echocardiogram examinations, most                  recent 07/22/2021. Takotsubo cardiomyopathy, CAD; Risk                  Factors:Dyslipidemia and Hypertension.  Sonographer:     Bernadene Person RDCS Referring Phys:  [id number] Early Osmond Diagnosing Phys: Gwyndolyn Kaufman MD IMPRESSIONS  1. RV free wall not visualized, however, there appears to be only a trivial-to-small pericardial effusion present (best seen posterior to the LV). There is a prominent fat pad.  2. Left ventricular ejection fraction, by estimation, is 60 to 65%. The left ventricle has normal function. The left ventricle has no regional wall motion abnormalities. There is moderate left ventricular hypertrophy.  3. The aortic valve is tricuspid. There is  moderate calcification of the aortic valve. There is moderate thickening of the aortic valve.  4. The inferior vena cava is dilated in size with >50% respiratory variability, suggesting right atrial pressure of 8 mmHg. Comparison(s): Compared to prior TTE on 07/22/21, the pericardial effusion is now significantly improved and now appears trivial to small (was previously moderate measuring up to 1.5cm). FINDINGS  Left Ventricle: Left ventricular ejection fraction, by estimation, is 60 to 65%. The left ventricle has normal function. The left ventricle has no regional wall motion abnormalities. There is moderate left ventricular hypertrophy. Pericardium: A small pericardial effusion  is present. Presence of epicardial fat layer. Aortic Valve: The aortic valve is tricuspid. There is moderate calcification of the aortic valve. There is moderate thickening of the aortic valve. Venous: The inferior vena cava is dilated in size with greater than 50% respiratory variability, suggesting right atrial pressure of 8 mmHg. Gwyndolyn Kaufman MD Electronically signed by Gwyndolyn Kaufman MD Signature Date/Time: 07/28/2021/1:13:36 PM    Final (Updated)     Cardiac Studies   Limited echocardiogram 2020/08/08:  1. Left ventricular ejection fraction, by estimation, is 60 to 65%. The  left ventricle has normal function.   2. Right ventricular systolic function is normal. The right ventricular  size is normal. There is normal pulmonary artery systolic pressure.   3. Pericardial effusion, overall small Largest along inferior surface of  RV/RA,particularly at sulcus. Compared to echo from 07/28/21, no significant  change No evidence of hemodynamic compromise by echo criteria.   4. Mild mitral valve regurgitation.   5. AV is thickened, calcified with restricted motion. Peak and mean  gradients through the valve are 37 and 22 mm Hg respectively.. The aortic  valve is tricuspid. Aortic valve regurgitation is mild.   6. The inferior vena cava is normal in size with greater than 50%  respiratory variability, suggesting right atrial pressure of 3 mmHg.    Limited Echocardiogram 07/28/20:   1. RV free wall not visualized, however, there appears to be only a  trivial-to-small pericardial effusion present (best seen posterior to the  LV). There is a prominent fat pad.   2. Left ventricular ejection fraction, by estimation, is 60 to 65%. The  left ventricle has normal function. The left ventricle has no regional  wall motion abnormalities. There is moderate left ventricular hypertrophy.   3. The aortic valve is tricuspid. There is moderate calcification of the  aortic valve. There is moderate thickening of  the aortic valve.   4. The inferior vena cava is dilated in size with >50% respiratory  variability, suggesting right atrial pressure of 8 mmHg.   Comparison(s): Compared to prior TTE on 07/22/21, the pericardial effusion  is now significantly improved and now appears trivial to small (was  previously moderate measuring up to 1.5cm).    Pericardiocentesis 07/28/20:   Successful pericardiocentesis and drain placement with evacuation of 400 cc of dark bloody fluid which was sent for analysis; the patient be transferred to the to heart unit for further monitoring.   Coronary CT 07/24/21:   IMPRESSION: 1. Tri leaflet AV with calcium score 2093 and planimetered AVA 1.01 cm2   2.  Annular area of 407 mm2 suitable for a 23 mm Sapien 3 valve   3.  Coronary arteries sufficient height above annulus for deployment   4. Optimal angiographic angle for deployment LAO 20 Caudal 19 degrees   5.  Large circumferential pericardial effusion   CT Chest 07/24/21:   IMPRESSION: No  evidence of thoracoabdominal aortic aneurysm or dissection.   Peripheral vascular disease with greater than 90% stenosis of the proximal right internal carotid artery. Plaque morphology appears irregular and may be better assessed with dedicated carotid artery Doppler sonography. 50% stenosis of the a dominant left vertebral artery at its origin. 50-75% stenosis of the inferior mesenteric artery at its origin, though this is of questionable clinical significance given patency of the celiac axis and superior mesenteric artery.   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   Cardiomegaly with left ventricular hypertrophy. Extensive calcification of the aortic valve leaflets. Enlarging, moderate pericardial effusion without definite CT evidence of cardiac tamponade. Note, however, small bilateral pleural effusions have developed since prior examination and can  be seen the setting of progressive cardiogenic failure. Pericardial fluid appears slightly hyperdense suggesting a proteinaceous or hemorrhagic component.   Cylindrical bronchiectasis and volume loss within the lingula and basilar right middle lobe with associated peribronchial nodularity. This may reflect the sequela of remote inflammation or reflect changes of a chronic infectious process such as atypical mycobacterial or fungal pneumonia.   Multiple subcentimeter arterially enhancing lesions within the liver possibly representing small flash filled hemangioma. These are not optimally characterized on this examination may be better assessed with dedicated MRI examination if clinically indicated.    R/LHC 07/23/21:     Prox RCA lesion is 50% stenosed.   Prox Cx to Mid Cx lesion is 25% stenosed.   Prox LAD to Mid LAD lesion is 25% stenosed.   Hemodynamic findings consistent with pulmonary hypertension.   Stable, mild to moderate coronary artery disease.  No indication for PCI.   Unable to cross aortic valve, consistent with worsening noted on echocardiogram.  Continue plans for TAVR w/u.   Echocardiogram 07/22/21:   IMPRESSIONS Left ventricular ejection fraction, by estimation, is 60 to 65%. The left ventricle has normal function. The left ventricle has no regional wall motion abnormalities. There is moderate concentric left ventricular hypertrophy. Left ventricular diastolic parameters are consistent with Grade I diastolic dysfunction (impaired relaxation). Elevated left ventricular end-diastolic pressure. 1.Right ventricular systolic function is normal. The right ventricular size is normal. There is normal pulmonary artery systolic pressure. 2. 3. Left atrial size was severely dilated. Pericardial effusion measuring 1.5 cm. Increased compared with 09/2019. . Moderate pericardial effusion. The pericardial effusion is circumferential. There is no evidence of cardiac  tamponade. 4. The mitral valve is degenerative. Trivial mitral valve regurgitation. No evidence of mitral stenosis. Moderate mitral annular calcification. 5. AoV not well visualized. Appears calcified. Vmax 3.4 m/s, MG 33 mmHG, AVA 1.38 cm2, DI 0.29. Moderate aortic stenosis. Mean gradient has increased from 20 mmHg 09/2019 to 33 mmHg now. The aortic valve is tricuspid. There is moderate calcification of the aortic valve. There is moderate thickening of the aortic valve. Aortic valve regurgitation is mild. Moderate aortic valve stenosis. Aortic valve area, by VTI measures 1.38 cm. Aortic valve mean gradient measures 33.0 mmHg. Aortic valve Vmax measures 3.41 m/s. 6. 7. Aortic dilatation noted. There is mild dilatation of the ascending aorta, measuring 37   Echocardiogram 09/24/2019:   1. Left ventricular ejection fraction, by estimation, is 60 to 65%. The left ventricle has normal function. The left ventricle has no regional wall motion abnormalities. There is severe asymmetric left ventricular hypertrophy of the septal segment (Sigmoid basal septum). Left ventricular diastolic parameters are consistent with Grade II diastolic dysfunction (pseudonormalization). 2. Right ventricular systolic function is normal. The right  ventricular size is normal. There is normal pulmonary artery systolic pressure. 3. Left atrial size was mildly dilated. 4. There is mild posterior mitral annular calcification. Mild to moderate mitral valve regurgitation. No evidence of mitral stenosis. 5. The aortic valve is is thickened and moderately calcified with annular calcification. Aortic valve regurgitation is mild to moderate. Moderate to severe aortic valve stenosis. Aortic valve area, by VTI measures 0.85 cm. Aortic valve mean gradient measures 20.5 mmHg. Aortic valve Vmax measures 2.81 m/s. 6. Compared to prior study done is October 2020, there have been improvement in the EF which was 30-35% with wall  motion abnormalities. Now EF is 60-65% with no wall motion abnormality. In addition, the Aortic stenosis is now moderate to severe was moderate.   R/LHC 05/13/2019:   Prox RCA lesion is 50% stenosed. There is moderate to severe left ventricular systolic dysfunction. LV end diastolic pressure is moderately elevated. The left ventricular ejection fraction is 35-45% by visual estimate. There is trivial (1+) mitral regurgitation. Hemodynamic findings consistent with moderate pulmonary hypertension.   1. Nonobstructive CAD 2. Moderate to severe LV dysfunction. EF 35-40%. Wall motion abnormality classic for Takotsubo's cardiomyopathy 3. Moderately elevated LV filling pressures 4. Moderate pulmonary venous HTN 5. Reduced cardiac output. CI 2.3   Plan: patient was SOB in the lab. We administered IV Ntg and lasix in the lab and increased oxygen with improvement in her symptoms and improvement in PA sat from 62% to 66%. Will continue with IV diuresis, continue ACEi and Metoprolol. Echo.   Patient Profile     Darrian Holahan is a 87 y.o. female with a hx of HTN, HLD with statin intolerance, peripheral vascular disease with moderate carotid artery stenosis and hx of Takotsubo cardiomyopathy with normalization who is being seen today for the evaluation of symptomatic, moderate aortic stenosis at the request of Dr. Ali Lowe.  Assessment & Plan    Severe symptomatic stage D aortic stenosis: Patient presented to [facility] with progressive SOB. Echocardiogram showed normal LVEF with a moderate circumfrential pericardial effusion measuring 1.5 cm. There was moderate AS with a mean gradient at 29mmHg (was 20.36mmhg), AVA 1.38cm and DI 0.29. She underwent [facility] 07/23/21 which showed stable, mild to moderate CAD with hemodynamics consistent with pulmonary hypertension. MD unable to cross aortic valve during procedure. TCTS consulted as part of the multidisciplinary valve team who felt that despite low MG with normal AVA,  there was a severely calcified and thickened aortic valve with restricted leaflet mobility and given her NYHA class III symptoms and more closely represented stage D severe symptomatic aortic stenosis. CTA showed anatomy suitable for TAVR using a SAPIEN 3 valve. Continues to have significant exertional SOB however improved after pericardiocentesis performed 07/29/20. Plan for discharge today and return for TAVR 08/03/20. All pre TAVR labs obtained today. Reviewed plan for Tuesday with the patient and her daughter.    Pericardial effusion: Patient found to have large pericardial effusion on pre TAVR CT imaging performed 07/24/21. Underwent pericardiocentesis today with 469ml bloody drainage. 113ml output after procedure and only 41ml overnight. Dr. Stanford Breed pulled drain today without complication. Fluid sent to cytology>>pending results. Post procedure echo with trivial to small pericardial effusion. Repeat limited echocardiogram 07/29/20 after drain removal with small residual small effusion.    Paroxysmal atrial fibrillation: Patient went into AF with RVR on 07/23/21 and was placed on IV diltiazem with quick conversion to NSR. Continues to have very brief recurrent episodes. Dicussed DOAC plan with Dr. Ali Lowe who would like  to hold off on [facility] for now. Would benefit from ZIO placement after TAVR to assess AF burden. Will discharge with ASA 325mg  PO QD.    Carotid artery disease: Pre TAVR head/neck CTA with significant carotid artery stenosis with plans for surveillance dopplers in the OP setting. May need VVS referral after TAVR. Continue ASA, statin.    Chronic diastolic CHF: Appears euvolemic on exam.     HTN: Stable, no changes at this time.   Hyponatremia: Na+ 125 today with a baseline that appears to be in the 128 range dating back to 2015.   Signed, Kathyrn Drown, NP  07/30/2021, 10:04 AM  Pager [phone]   ATTENDING ATTESTATION:  After conducting a review of all available clinical information  with the care team, interviewing the patient, and performing a physical exam, I agree with the findings and plan described in this note.   GEN: No acute distress.   Cardiac: RRR, 3/6 SEM, no rubs, or gallops.  Respiratory: Clear to auscultation bilaterally. GI: Soft, nontender, non-distended  MS: No edema; No deformity. Neuro:  Nonfocal   Patient is well.  She reports some dyspnea which I think may be from deconditioning rather than overt volume overload as patient is euvolemic on exam.  Discharge today.  Will follow up on pericardial fluid analysis (expect to be benign) and plan on Zio monitor after TAVR to assess for AF burden and need for A/C.  Hyponatremic (euvolemic, chronically) with unclear etiology, monitor.    Lenna Sciara, MD Pager [phone]

## 2021-07-30 NOTE — Discharge Summary (Addendum)
PATIENT DETAILS Name: Barbara Mcconnell Age: 87 y.o. Sex: female Date of Birth: 12-26-1934 MRN: [id number]. Admitting Physician: Toy Baker, MD [id number], Valaria Good., MD  Admit Date: 07/21/2021 Discharge date: 07/30/2021  Recommendations for Outpatient Follow-up:  Follow up with PCP in 1-2 weeks Please obtain CMP/CBC in one week Please follow cytology from pericardial fluid Please follow pericardial fluid cultures until final. If sodium levels do not improve with furosemide/fluid restriction-May need to consider stopping Lexapro. Ensure follow-up with cardiology.  Admitted From:  Home  Disposition: Home with home health services   [place]: Yes  Equipment/Devices: None  Discharge Condition: Stable  CODE STATUS: FULL CODE  Diet recommendation:  Diet Order             Diet - low sodium heart healthy           Diet Heart Room service appropriate? Yes; Fluid consistency: Thin; Fluid restriction: 1200 mL Fluid  Diet effective now                    Brief Summary: Patient is a 87 y.o. female with history of HTN, HLD, moderate AAS, Takotsubo cardiomyopathy-who presented with worsening exertional dyspnea-she was found to have large pericardial effusion, severe AS.  See below for further details  Brief Hospital Course: Large pericardial effusion: S/p pericardiocentesis on 1/4-pericardial drain removed today-repeat echo planned per cardiology.  Pericardial fluid cultures are negative so far-please follow cytology/cultures until final.  Post pericardiocentesis echo showed only small residual pericardial effusion.   Severe aortic stenosis: Cardiology/CTS following-CTA/LHC performed-plans are for TAVR next week.  90% right internal carotid artery disease: Cardiology planning on repeating outpatient Dopplers.  On antiplatelet agents- will also require vascular surgery evaluation after TAVR.  I have reached out to Dr. Aundra Dubin surgery-he will arrange for  follow-up in the next few weeks.  Nonobstructive CAD: Continue ASA-statin on hold due to elevated LFTs.  PAF: Maintaining sinus rhythm-on Cardizem-anticoagulation planned only after TAVR.  Will be discharged on aspirin.   Chronic diastolic heart failure: Volume status relatively stable-some improvement in lower extremity edema overnight.  Not keen on taking Lasix-due to urine output.  After extensive discussion-she has agreed to take 20 mg of Lasix.     Atypical pneumonia: Reviewed outpatient pulmonology note-Zithromax x7 days was planned.  Suspect her shortness of breath was mostly from her cardiac issues rather than PNA.   HTN: BP stable-continue metoprolol, lisinopril and Cardizem.     Acute hepatitis A infection (IgM Ab positive): Supportive care-LFTs have now normalized.   Hyponatremia: Asymptomatic-she did have some mild leg edema a few days ago-but she is now euvolemic-suspect this is either from SIADH from Lexapro use or from mild volume overload.  In any event-patient is not keen on taking higher doses of furosemide-because she really does not want to run to the bathroom.  After extensive discussion-she agreed to take Lasix-but only at a dose of 20 mg daily.  She has been started on a fluid restriction.  Since she is only on 5 mg of Lexapro-I have stopped this as well to see if this will improve her sodium levels (spoke to patient over the phone-she was agreeable).  She is scheduled for TAVR next week-hopefully we can get her sodium levels stabilized by then.  Please recheck electrolytes early next week.     Mood disorder: See above discussion-stopping Lexapro.   BMI Estimated body mass index is 24.93 kg/m as calculated from the following:   Height as of  this encounter: 5' (1.524 m).   Weight as of this encounter: 57.9 kg.     Procedures 07/23/21>> LHC 07/28/2021>> pericardiocentesis  Discharge Diagnoses:  Principal Problem:   Pericardial effusion Active Problems:   Essential  hypertension   Dyslipidemia, goal LDL below 70   Carotid arterial disease (HCC)   Aortic stenosis   Hypertension   Fluid overload   Bronchiectasis with acute exacerbation (HCC)   Other cirrhosis of liver (HCC)   Hyponatremia   Discharge Instructions:  Activity:  As tolerated with Full fall precautions use walker/cane & assistance as needed  Discharge Instructions     (HEART FAILURE PATIENTS) Call MD:  Anytime you have any of the following symptoms: 1) 3 pound weight gain in 24 hours or 5 pounds in 1 week 2) shortness of breath, with or without a dry hacking cough 3) swelling in the hands, feet or stomach 4) if you have to sleep on extra pillows at night in order to breathe.   Complete by: As directed    Diet - low sodium heart healthy   Complete by: As directed    1500 cc of fluid restriction daily.   Discharge instructions   Complete by: As directed    Follow with Primary MD  Kristopher Glee., MD in 1-2 weeks  Please get a complete blood count and chemistry panel checked by your Primary MD at your next visit, and again as instructed by your Primary MD.  Get Medicines reviewed and adjusted: Please take all your medications with you for your next visit with your Primary MD  Laboratory/radiological data: Please request your Primary MD to go over all hospital tests and procedure/radiological results at the follow up, please ask your Primary MD to get all Hospital records sent to his/her office.  In some cases, they will be blood work, cultures and biopsy results pending at the time of your discharge. Please request that your primary care M.D. follows up on these results.  Also Note the following: If you experience worsening of your admission symptoms, develop shortness of breath, life threatening emergency, suicidal or homicidal thoughts you must seek medical attention immediately by calling 911 or calling your MD immediately  if symptoms less severe.  You must read complete  instructions/literature along with all the possible adverse reactions/side effects for all the Medicines you take and that have been prescribed to you. Take any new Medicines after you have completely understood and accpet all the possible adverse reactions/side effects.   Do not drive when taking Pain medications or sleeping medications (Benzodaizepines)  Do not take more than prescribed Pain, Sleep and Anxiety Medications. It is not advisable to combine anxiety,sleep and pain medications without talking with your primary care practitioner  Special Instructions: If you have smoked or chewed Tobacco  in the last 2 yrs please stop smoking, stop any regular Alcohol  and or any Recreational drug use.  Wear Seat belts while driving.  Please note: You were cared for by a hospitalist during your hospital stay. Once you are discharged, your primary care physician will handle any further medical issues. Please note that NO REFILLS for any discharge medications will be authorized once you are discharged, as it is imperative that you return to your primary care physician (or establish a relationship with a primary care physician if you do not have one) for your post hospital discharge needs so that they can reassess your need for medications and monitor your lab values.   1.  Follow-up with cardiology-and continue with plans for TAVR  2.  You will get a call from vascular surgery for evaluation of 90% stenosis of the right carotid artery.  3.  Please continue to maintain fluid restriction of 1500 cc a day, continue Lasix and have your electrolytes checked in the next few days.  We have discontinued Lexapro-to see if this will improve your sodium levels.  4.  Pathology/cultures from pericardial fluid is still pending-your outpatient physicians will need to follow on these results.   Increase activity slowly   Complete by: As directed       Allergies as of 07/30/2021       Reactions   Codeine Nausea And  Vomiting, Nausea Only   Cyclobenzaprine    Iodinated Contrast Media    Prednisone Other (See Comments)   Sores in mouth        Medication List     STOP taking these medications    azithromycin 250 MG tablet Commonly known as: ZITHROMAX   cyclobenzaprine 5 MG tablet Commonly known as: FLEXERIL   escitalopram 5 mg Tabs tablet Commonly known as: LEXAPRO   metoprolol succinate 25 MG 24 hr tablet Commonly known as: TOPROL-XL       TAKE these medications    acetaminophen 500 MG tablet Commonly known as: TYLENOL Take 500-1,000 mg by mouth every 6 (six) hours as needed for moderate pain.   albuterol 108 (90 Base) MCG/ACT inhaler Commonly known as: VENTOLIN HFA Inhale 2 puffs into the lungs every 4 (four) hours as needed for wheezing or shortness of breath (seasonal allergies).   ASPERCREME EX Apply 1 application topically daily as needed (knee pain).   aspirin 325 MG tablet Take 1 tablet (325 mg total) by mouth daily. Start taking on: July 31, 2021   cetirizine 10 MG tablet Commonly known as: ZYRTEC Take 10 mg by mouth daily with breakfast.   diltiazem 120 MG 24 hr capsule Commonly known as: CARDIZEM CD Take 1 capsule (120 mg total) by mouth daily. Start taking on: July 31, 2021   docusate sodium 100 MG capsule Commonly known as: COLACE Take 100 mg by mouth daily with breakfast.   Emergen-C Vitamin C Pack Take 1 packet by mouth daily with lunch.   FISH OIL PO Take 1 capsule by mouth daily.   furosemide 20 MG tablet Commonly known as: LASIX Take 20 mg by mouth daily.   lidocaine 5 % Commonly known as: Lidoderm Place 1 patch onto the skin daily. Remove & Discard patch within 12 hours or as directed by MD   lisinopril 10 MG tablet Commonly known as: ZESTRIL Take 25 mg by mouth daily.   metoprolol tartrate 25 MG tablet Commonly known as: LOPRESSOR Take 1 tablet (25 mg total) by mouth 2 (two) times daily.   montelukast 10 MG tablet Commonly  known as: SINGULAIR Take 10 mg by mouth daily after supper.   multivitamin with minerals Tabs tablet Take 1 tablet by mouth daily. Centrum 50 plus   omeprazole 20 MG capsule Commonly known as: PRILOSEC Take 20 mg by mouth daily before breakfast.   rosuvastatin 5 MG tablet Commonly known as: CRESTOR Take 5 mg by mouth every evening.   VITAMIN D3 PO Take 1 tablet by mouth daily.        Follow-up Information     Kristopher Glee., MD. Schedule an appointment as soon as possible for a visit in 1 week(s).   Specialty: Internal Medicine Contact information: [phone]  [address] [place] [address] [phone]         [place], Peter M, MD Follow up.   Specialty: Cardiology Why: Office will call with date/time Contact information: [place] [address] [phone]         Broadus John, MD Follow up.   Specialty: Vascular Surgery Why: Office will call with date/time, If you dont hear from them,please give them a call Contact information: [place] [address] [phone]                Allergies  Allergen Reactions   Codeine Nausea And Vomiting and Nausea Only   Cyclobenzaprine    Iodinated Contrast Media    Prednisone Other (See Comments)    Sores in mouth      Consultations:  cardiology   Other Procedures/Studies: DG Chest 2 View  Result Date: 07/21/2021 CLINICAL DATA:  Shortness of breath. EXAM: CHEST - 2 VIEW COMPARISON:  Chest radiograph dated 07/18/2021. FINDINGS: Cardiomegaly with vascular congestion. Probable small bilateral pleural effusions and bibasilar atelectasis or infiltrate. No pneumothorax. Atherosclerotic calcification of the aorta. No acute osseous pathology. IMPRESSION: Cardiomegaly with vascular congestion and small bilateral pleural effusions. Electronically Signed   By: Anner Crete M.D.   On: 07/21/2021 19:28   CT CHEST W CONTRAST  Result Date:  07/24/2021 CLINICAL DATA:  Thoracic aorta disease, post-op; Aneurysm, pelvis or lower extremity. Aneurysm, pelvis or lower extremity. Severe symptomatic aortic stenosis. EXAM: CT ANGIOGRAPHY CHEST, ABDOMEN AND PELVIS TECHNIQUE: Multidetector CT imaging through the chest, abdomen and pelvis was performed using the standard protocol during bolus administration of intravenous contrast. Multiplanar reconstructed images and MIPs were obtained and reviewed to evaluate the vascular anatomy. CONTRAST:  151m OMNIPAQUE IOHEXOL 350 MG/ML SOLN COMPARISON:  07/18/2021 FINDINGS: CTA CHEST FINDINGS Cardiovascular: The thoracic aorta is of normal caliber, measuring 3.6 x 3.7 cm in its ascending segment, 2.9 x 2.6 cm within the arch just beyond the takeoff of the left subclavian artery, and 2.6 x 2.2 cm within its descending segment at the level of the left atrium. No intramural hematoma or dissection. Moderate calcified atherosclerotic plaque. Conventional arch vessel anatomic configuration. Less than 50% stenosis of the left subclavian artery at its origin. Extensive, irregular mixed atherosclerotic plaque at the right carotid bifurcation results in a greater than 90% stenosis of the proximal right internal carotid artery calcified atherosclerotic plaque at the left carotid bifurcation results in a less than 50% stenosis the proximal left internal carotid artery. Calcified atherosclerotic plaque results in a 50% stenosis of the dominant left vertebral artery at its origin. Mild cardiomegaly with left ventricular hypertrophy noted. Extensive atherosclerotic calcification of the aortic valve leaflets. The aortic valve is trileaflet. Moderate coronary artery calcification with probable stenting of the proximal right coronary artery. Moderate pericardial effusion is present, enlarged in size when compared to prior examination. Pericardial fluid appears slightly hyperdense suggesting proteinaceous or hemorrhagic fluid. No CT evidence  of cardiac tamponade. The central pulmonary arteries are of normal caliber. Mediastinum/Nodes: No enlarged mediastinal, hilar, or axillary lymph nodes. Thyroid gland, trachea, and esophagus demonstrate no significant findings. Lungs/Pleura: Small bilateral pleural effusions are present with compressive atelectasis of the a lower lobes bilaterally, left greater than right. There is cylindrical bronchiectasis and volume loss noted within the lingula and basilar right middle lobe. Mild peribronchial nodularity within these regions may reflect the sequela of remote inflammation or a chronic atypical infectious process as can be seen  with mycobacterial or fungal infection. No confluent pulmonary infiltrate. No pneumothorax. No central obstructing lesion. Musculoskeletal: No acute bone abnormality. No lytic or blastic bone lesion. Review of the MIP images confirms the above findings. CTA ABDOMEN AND PELVIS FINDINGS VASCULAR Aorta: Normal caliber. No aneurysm or dissection. Moderate atherosclerotic calcification. No periaortic inflammatory change. Celiac: Less than 50% stenosis of the a celiac origin secondary to calcified atherosclerotic plaque. Distally widely patent. No aneurysm or dissection. SMA: Patent without evidence of aneurysm, dissection, vasculitis or significant stenosis. Renals: Single renal arteries bilaterally. Less than 50% stenosis of the origin of the right renal artery. Widely patent left renal artery. Both arteries demonstrate a a beaded appearance within there mid segments in keeping with changes of fibromuscular dysplasia. No aneurysm or dissection. IMA: 50-75% stenosis of the origin.  Distally widely patent. Inflow: Mild atherosclerotic calcification. No evidence of hemodynamically significant stenosis. No aneurysm or dissection. Common iliac arteries measure 8-9 mm in diameter bilaterally. External iliac arteries measure 6 mm in diameter bilaterally. Common femoral arteries measure 7 mm in diameter  bilaterally. Veins: No obvious venous abnormality within the limitations of this arterial phase study. Review of the MIP images confirms the above findings. NON-VASCULAR Hepatobiliary: Geographic region of hyperenhancement within segment 2 of the liver may relate to underlying arterial portal shunting with a resultant transient hepatic attenuation difference (THAD). Similar region is seen within segment 4B of the liver. There are at least 3 arterial enhancing rounded lesions within segment 4 and segment 8 of the liver measuring up to 8 mm (image # 137/4) which are nonspecific but may represent small flash filled hemangioma. The liver is otherwise unremarkable. Gallbladder unremarkable. No intra or extrahepatic biliary ductal dilation. Pancreas: Unremarkable Spleen: Unremarkable Adrenals/Urinary Tract: The adrenal glands are unremarkable. The kidneys are normal in position. Moderate left renal asymmetric cortical atrophy. No enhancing intrarenal masses. No hydronephrosis. No intrarenal or ureteral calculi. The bladder is unremarkable. Stomach/Bowel: Stomach is within normal limits. Appendix appears normal. No evidence of bowel wall thickening, distention, or inflammatory changes. Lymphatic: No pathologic adenopathy within the abdomen and pelvis. Reproductive: Uterus and bilateral adnexa are unremarkable. Other: Surgical dressing noted within the right inguinal region. No abdominal wall hernia. Musculoskeletal: Degenerative changes are noted within the lumbar spine with grade 1 anterolisthesis of L4-5. No acute bone abnormality. No lytic or blastic bone lesion. Review of the MIP images confirms the above findings. IMPRESSION: No evidence of thoracoabdominal aortic aneurysm or dissection. Peripheral vascular disease with greater than 90% stenosis of the proximal right internal carotid artery. Plaque morphology appears irregular and may be better assessed with dedicated carotid artery Doppler sonography. 50% stenosis of  the a dominant left vertebral artery at its origin. 50-75% stenosis of the inferior mesenteric artery at its origin, though this is of questionable clinical significance given patency of the celiac axis and superior mesenteric artery. 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 Cardiomegaly with left ventricular hypertrophy. Extensive calcification of the aortic valve leaflets. Enlarging, moderate pericardial effusion without definite CT evidence of cardiac tamponade. Note, however, small bilateral pleural effusions have developed since prior examination and can be seen the setting of progressive cardiogenic failure. Pericardial fluid appears slightly hyperdense suggesting a proteinaceous or hemorrhagic component. Cylindrical bronchiectasis and volume loss within the lingula and basilar right middle lobe with associated peribronchial nodularity. This may reflect the sequela of remote inflammation or reflect changes of a chronic infectious process such as atypical mycobacterial or fungal pneumonia. Multiple  subcentimeter arterially enhancing lesions within the liver possibly representing small flash filled hemangioma. These are not optimally characterized on this examination may be better assessed with dedicated MRI examination if clinically indicated. Aortic Atherosclerosis (ICD10-I70.0). Electronically Signed   By: Fidela Salisbury M.D.   On: 07/24/2021 21:16   CT ANGIO ABDOMEN W &/OR WO CONTRAST  Result Date: 07/24/2021 CLINICAL DATA:  Thoracic aorta disease, post-op; Aneurysm, pelvis or lower extremity. Aneurysm, pelvis or lower extremity. Severe symptomatic aortic stenosis. EXAM: CT ANGIOGRAPHY CHEST, ABDOMEN AND PELVIS TECHNIQUE: Multidetector CT imaging through the chest, abdomen and pelvis was performed using the standard protocol during bolus administration of intravenous contrast. Multiplanar reconstructed images and MIPs were obtained and  reviewed to evaluate the vascular anatomy. CONTRAST:  118m OMNIPAQUE IOHEXOL 350 MG/ML SOLN COMPARISON:  07/18/2021 FINDINGS: CTA CHEST FINDINGS Cardiovascular: The thoracic aorta is of normal caliber, measuring 3.6 x 3.7 cm in its ascending segment, 2.9 x 2.6 cm within the arch just beyond the takeoff of the left subclavian artery, and 2.6 x 2.2 cm within its descending segment at the level of the left atrium. No intramural hematoma or dissection. Moderate calcified atherosclerotic plaque. Conventional arch vessel anatomic configuration. Less than 50% stenosis of the left subclavian artery at its origin. Extensive, irregular mixed atherosclerotic plaque at the right carotid bifurcation results in a greater than 90% stenosis of the proximal right internal carotid artery calcified atherosclerotic plaque at the left carotid bifurcation results in a less than 50% stenosis the proximal left internal carotid artery. Calcified atherosclerotic plaque results in a 50% stenosis of the dominant left vertebral artery at its origin. Mild cardiomegaly with left ventricular hypertrophy noted. Extensive atherosclerotic calcification of the aortic valve leaflets. The aortic valve is trileaflet. Moderate coronary artery calcification with probable stenting of the proximal right coronary artery. Moderate pericardial effusion is present, enlarged in size when compared to prior examination. Pericardial fluid appears slightly hyperdense suggesting proteinaceous or hemorrhagic fluid. No CT evidence of cardiac tamponade. The central pulmonary arteries are of normal caliber. Mediastinum/Nodes: No enlarged mediastinal, hilar, or axillary lymph nodes. Thyroid gland, trachea, and esophagus demonstrate no significant findings. Lungs/Pleura: Small bilateral pleural effusions are present with compressive atelectasis of the a lower lobes bilaterally, left greater than right. There is cylindrical bronchiectasis and volume loss noted within the  lingula and basilar right middle lobe. Mild peribronchial nodularity within these regions may reflect the sequela of remote inflammation or a chronic atypical infectious process as can be seen with mycobacterial or fungal infection. No confluent pulmonary infiltrate. No pneumothorax. No central obstructing lesion. Musculoskeletal: No acute bone abnormality. No lytic or blastic bone lesion. Review of the MIP images confirms the above findings. CTA ABDOMEN AND PELVIS FINDINGS VASCULAR Aorta: Normal caliber. No aneurysm or dissection. Moderate atherosclerotic calcification. No periaortic inflammatory change. Celiac: Less than 50% stenosis of the a celiac origin secondary to calcified atherosclerotic plaque. Distally widely patent. No aneurysm or dissection. SMA: Patent without evidence of aneurysm, dissection, vasculitis or significant stenosis. Renals: Single renal arteries bilaterally. Less than 50% stenosis of the origin of the right renal artery. Widely patent left renal artery. Both arteries demonstrate a a beaded appearance within there mid segments in keeping with changes of fibromuscular dysplasia. No aneurysm or dissection. IMA: 50-75% stenosis of the origin.  Distally widely patent. Inflow: Mild atherosclerotic calcification. No evidence of hemodynamically significant stenosis. No aneurysm or dissection. Common iliac arteries measure 8-9 mm in diameter bilaterally. External iliac arteries measure 6 mm in diameter  bilaterally. Common femoral arteries measure 7 mm in diameter bilaterally. Veins: No obvious venous abnormality within the limitations of this arterial phase study. Review of the MIP images confirms the above findings. NON-VASCULAR Hepatobiliary: Geographic region of hyperenhancement within segment 2 of the liver may relate to underlying arterial portal shunting with a resultant transient hepatic attenuation difference (THAD). Similar region is seen within segment 4B of the liver. There are at least  3 arterial enhancing rounded lesions within segment 4 and segment 8 of the liver measuring up to 8 mm (image # 137/4) which are nonspecific but may represent small flash filled hemangioma. The liver is otherwise unremarkable. Gallbladder unremarkable. No intra or extrahepatic biliary ductal dilation. Pancreas: Unremarkable Spleen: Unremarkable Adrenals/Urinary Tract: The adrenal glands are unremarkable. The kidneys are normal in position. Moderate left renal asymmetric cortical atrophy. No enhancing intrarenal masses. No hydronephrosis. No intrarenal or ureteral calculi. The bladder is unremarkable. Stomach/Bowel: Stomach is within normal limits. Appendix appears normal. No evidence of bowel wall thickening, distention, or inflammatory changes. Lymphatic: No pathologic adenopathy within the abdomen and pelvis. Reproductive: Uterus and bilateral adnexa are unremarkable. Other: Surgical dressing noted within the right inguinal region. No abdominal wall hernia. Musculoskeletal: Degenerative changes are noted within the lumbar spine with grade 1 anterolisthesis of L4-5. No acute bone abnormality. No lytic or blastic bone lesion. Review of the MIP images confirms the above findings. IMPRESSION: No evidence of thoracoabdominal aortic aneurysm or dissection. Peripheral vascular disease with greater than 90% stenosis of the proximal right internal carotid artery. Plaque morphology appears irregular and may be better assessed with dedicated carotid artery Doppler sonography. 50% stenosis of the a dominant left vertebral artery at its origin. 50-75% stenosis of the inferior mesenteric artery at its origin, though this is of questionable clinical significance given patency of the celiac axis and superior mesenteric artery. 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 Cardiomegaly with left ventricular hypertrophy. Extensive calcification of the aortic valve leaflets.  Enlarging, moderate pericardial effusion without definite CT evidence of cardiac tamponade. Note, however, small bilateral pleural effusions have developed since prior examination and can be seen the setting of progressive cardiogenic failure. Pericardial fluid appears slightly hyperdense suggesting a proteinaceous or hemorrhagic component. Cylindrical bronchiectasis and volume loss within the lingula and basilar right middle lobe with associated peribronchial nodularity. This may reflect the sequela of remote inflammation or reflect changes of a chronic infectious process such as atypical mycobacterial or fungal pneumonia. Multiple subcentimeter arterially enhancing lesions within the liver possibly representing small flash filled hemangioma. These are not optimally characterized on this examination may be better assessed with dedicated MRI examination if clinically indicated. Aortic Atherosclerosis (ICD10-I70.0). Electronically Signed   By: Fidela Salisbury M.D.   On: 07/24/2021 21:16   CARDIAC CATHETERIZATION  Result Date: 07/28/2021 1.  Successful pericardiocentesis and drain placement with evacuation of 400 cc of dark bloody fluid which was sent for analysis; the patient be transferred to the to heart unit for further monitoring.   CARDIAC CATHETERIZATION  Result Date: 07/23/2021   Prox RCA lesion is 50% stenosed.   Prox Cx to Mid Cx lesion is 25% stenosed.   Prox LAD to Mid LAD lesion is 25% stenosed.   Hemodynamic findings consistent with pulmonary hypertension. Stable, mild to moderate coronary artery disease.  No indication for PCI. Unable to cross aortic valve, consistent with worsening noted on echocardiogram.  Continue plans for TAVR w/u.   CT CORONARY MORPH W/CTA COR W/SCORE  W/CA W/CM &/OR WO/CM  Addendum Date: 07/27/2021   ADDENDUM REPORT: 07/27/2021 19:04 ADDENDUM: Extracardiac findings will be described separately under dictation for contemporaneously obtained CTA chest, abdomen, and pelvis.  IMPRESSION: Please see separate dictation for contemporaneously obtained CTA chest, abdomen, and pelvis dated 07/24/2021 for full description of relevant extracardiac findings. Electronically Signed   By: Fidela Salisbury M.D.   On: 07/27/2021 19:04   Result Date: 07/27/2021 CLINICAL DATA:  Aortic Stenosis EXAM: Cardiac TAVR CT TECHNIQUE: The patient was scanned on a Siemens Force 528 slice scanner. A 120 kV retrospective scan was triggered in the ascending thoracic aorta at 140 HU's. Gantry rotation speed was 250 msecs and collimation was .6 mm. No beta blockade or nitro were given. The 3D data set was reconstructed in 5% intervals of the R-R cycle. Systolic and diastolic phases were analyzed on a dedicated work station using MPR, MIP and VRT modes. The patient received 80 cc of contrast. FINDINGS: Aortic Valve: Tri leaflet calcified score 2093 Aorta: Bovine arch no aneurysm moderate calcific atherosclerosis Sino-tubular Junction: 25 nn Ascending Thoracic Aorta: 35 mm Aortic Arch: 27 mm Descending Thoracic Aorta: 27 mm Sinus of Valsalva Measurements: Non-coronary: 29.3 mm Right - coronary: 28.6 mm Left -   coronary: 28 mm Coronary Artery Height above Annulus: Left Main: 15 mm above annulus Right Coronary: 15.6 mm above annulus Virtual Basal Annulus Measurements: Average  diameter: 22.8 mm Perimeter: 74.9 mm Area: 407 mm2 AVA by planimetry 1.01 cm2 Coronary Arteries: Sufficient height above annulus for deployment Optimum Fluoroscopic Angle for Delivery: LAO 20 Caudal 19 degrees IMPRESSION: 1. Tri leaflet AV with calcium score 2093 and planimetered AVA 1.01 cm2 2.  Annular area of 407 mm2 suitable for a 23 mm Sapien 3 valve 3.  Coronary arteries sufficient height above annulus for deployment 4. Optimal angiographic angle for deployment LAO 20 Caudal 19 degrees 5.  Large circumferential pericardial effusion Jenkins Rouge Electronically Signed: By: Jenkins Rouge M.D. On: 07/25/2021 06:57   DG Chest Port 1  View  Result Date: 06/30/2021 CLINICAL DATA:  Chest pain. Additional history provided: Patient reports lifting a heavy pot yesterday, left shoulder and chest pain upon inspiration, pain in back and between shoulder blades, chest tightness with inspiration. EXAM: PORTABLE CHEST 1 VIEW COMPARISON:  Chest radiograph 05/13/2019 and earlier. FINDINGS: Cardiomegaly. Aortic atherosclerosis. Suspected trace left pleural effusion. Minimal atelectasis and/or scarring within the lung bases. No evidence of pneumothorax. No acute bony abnormality identified. IMPRESSION: Suspected trace left pleural effusion. Minimal atelectasis and/or scarring within the lung bases. Cardiomegaly. Aortic Atherosclerosis (ICD10-I70.0). Electronically Signed   By: Kellie Simmering D.O.   On: 06/30/2021 12:59   ECHOCARDIOGRAM COMPLETE  Result Date: 07/22/2021    ECHOCARDIOGRAM REPORT   Patient Name:   Barbara Mcconnell Date of Exam: 07/22/2021 Medical Rec #:  [id number]    Height:       61.0 in Accession #:    [id number]   Weight:       131.6 lb Date of Birth:  Jul 19, 1935     BSA:          1.581 m Patient Age:    39 years     BP:           155/92 mmHg Patient Gender: F            HR:           102 bpm. Exam Location:  Inpatient Procedure: 2D Echo, Color Doppler and Cardiac Doppler STAT ECHO  Indications:    I31.3 Pericardial effusion  History:        Patient has prior history of Echocardiogram examinations, most                 recent 09/23/2019. Risk Factors:Hypertension and Dyslipidemia.                 Remote history of Takotsubo x2.  Sonographer:    Raquel Sarna Senior RDCS Referring Phys: [place]  1. Left ventricular ejection fraction, by estimation, is 60 to 65%. The left ventricle has normal function. The left ventricle has no regional wall motion abnormalities. There is moderate concentric left ventricular hypertrophy. Left ventricular diastolic parameters are consistent with Grade I diastolic dysfunction (impaired relaxation).  Elevated left ventricular end-diastolic pressure.  2. Right ventricular systolic function is normal. The right ventricular size is normal. There is normal pulmonary artery systolic pressure.  3. Left atrial size was severely dilated.  4. Pericardial effusion measuring 1.5 cm. Increased compared with 09/2019. . Moderate pericardial effusion. The pericardial effusion is circumferential. There is no evidence of cardiac tamponade.  5. The mitral valve is degenerative. Trivial mitral valve regurgitation. No evidence of mitral stenosis. Moderate mitral annular calcification.  6. AoV not well visualized. Appears calcified. Vmax 3.4 m/s, MG 33 mmHG, AVA 1.38 cm2, DI 0.29. Moderate aortic stenosis. Mean gradient has increased from 20 mmHg 09/2019 to 33 mmHg now. The aortic valve is tricuspid. There is moderate calcification of the aortic valve. There is moderate thickening of the aortic valve. Aortic valve regurgitation is mild. Moderate aortic valve stenosis. Aortic valve area, by VTI measures 1.38 cm. Aortic valve mean gradient measures 33.0 mmHg. Aortic valve Vmax measures  3.41 m/s.  7. Aortic dilatation noted. There is mild dilatation of the ascending aorta, measuring 37 mm.  8. The inferior vena cava is dilated in size with >50% respiratory variability, suggesting right atrial pressure of 8 mmHg. FINDINGS  Left Ventricle: Left ventricular ejection fraction, by estimation, is 60 to 65%. The left ventricle has normal function. The left ventricle has no regional wall motion abnormalities. The left ventricular internal cavity size was normal in size. There is  moderate concentric left ventricular hypertrophy. Left ventricular diastolic parameters are consistent with Grade I diastolic dysfunction (impaired relaxation). Elevated left ventricular end-diastolic pressure. Right Ventricle: The right ventricular size is normal. No increase in right ventricular wall thickness. Right ventricular systolic function is normal. There is  normal pulmonary artery systolic pressure. The tricuspid regurgitant velocity is 1.66 m/s, and  with an assumed right atrial pressure of 8 mmHg, the estimated right ventricular systolic pressure is 69.6 mmHg. Left Atrium: Left atrial size was severely dilated. Right Atrium: Right atrial size was normal in size. Pericardium: Pericardial effusion measuring 1.5 cm. Increased compared with 09/2019. A moderately sized pericardial effusion is present. The pericardial effusion is circumferential. There is no evidence of cardiac tamponade. Presence of epicardial fat layer. Thickening/calcification of pericardium present. Mitral Valve: The mitral valve is degenerative in appearance. Moderate mitral annular calcification. Trivial mitral valve regurgitation. No evidence of mitral valve stenosis. MV peak gradient, 17.1 mmHg. The mean mitral valve gradient is 7.0 mmHg. Tricuspid Valve: The tricuspid valve is grossly normal. Tricuspid valve regurgitation is trivial. No evidence of tricuspid stenosis. Aortic Valve: AoV not well visualized. Appears calcified. Vmax 3.4 m/s, MG 33 mmHG, AVA 1.38 cm2, DI 0.29. Moderate aortic stenosis. Mean gradient has increased from 20 mmHg 09/2019 to 33 mmHg now. The aortic valve  is tricuspid. There is moderate calcification of the aortic valve. There is moderate thickening of the aortic valve. Aortic valve regurgitation is mild. Moderate aortic stenosis is present. Aortic valve mean gradient measures 33.0 mmHg. Aortic valve peak gradient measures 46.5 mmHg. Aortic valve area, by VTI measures 1.38 cm. Pulmonic Valve: The pulmonic valve was grossly normal. Pulmonic valve regurgitation is trivial. No evidence of pulmonic stenosis. Aorta: Aortic dilatation noted. There is mild dilatation of the ascending aorta, measuring 37 mm. Venous: The inferior vena cava is dilated in size with greater than 50% respiratory variability, suggesting right atrial pressure of 8 mmHg. IAS/Shunts: The atrial septum is  grossly normal.  LEFT VENTRICLE PLAX 2D LVIDd:         2.50 cm   Diastology LVIDs:         1.80 cm   LV e' medial:    5.66 cm/s LV PW:         1.30 cm   LV E/e' medial:  16.5 LV IVS:        1.60 cm   LV e' lateral:   3.48 cm/s LVOT diam:     1.91 cm   LV E/e' lateral: 26.8 LV SV:         85 LV SV Index:   54 LVOT Area:     2.87 cm  RIGHT VENTRICLE RV S prime:     13.30 cm/s TAPSE (M-mode): 2.3 cm LEFT ATRIUM             Index        RIGHT ATRIUM           Index LA diam:        2.90 cm 1.83 cm/m   RA Area:     14.50 cm LA Vol (A2C):   44.7 ml 28.27 ml/m  RA Volume:   36.00 ml  22.77 ml/m LA Vol (A4C):   69.0 ml 43.64 ml/m LA Biplane Vol: 56.0 ml 35.42 ml/m  AORTIC VALVE AV Area (Vmax):    1.41 cm AV Area (Vmean):   1.25 cm AV Area (VTI):     1.38 cm AV Vmax:           341.00 cm/s AV Vmean:          280.000 cm/s AV VTI:            0.614 m AV Peak Grad:      46.5 mmHg AV Mean Grad:      33.0 mmHg LVOT Vmax:         168.00 cm/s LVOT Vmean:        122.000 cm/s LVOT VTI:          0.296 m LVOT/AV VTI ratio: 0.48  AORTA Ao Root diam: 3.20 cm Ao Asc diam:  3.70 cm MITRAL VALVE                TRICUSPID VALVE MV Area (PHT): 4.24 cm     TR Peak grad:   11.0 mmHg MV Peak grad:  17.1 mmHg    TR Vmax:        166.00 cm/s MV Mean grad:  7.0 mmHg MV Vmax:       2.07 m/s     SHUNTS MV Vmean:      129.0 cm/s   Systemic VTI:  0.30 m MV Decel Time: 179 msec     Systemic Diam: 1.91 cm MV E velocity: 93.40 cm/s MV A velocity: 179.00 cm/s MV E/A ratio:  0.52 Skeet Latch MD Electronically signed by Skeet Latch MD Signature Date/Time: 07/22/2021/10:57:57 AM    Final    CT Angio Chest/Abd/Pel for Dissection W and/or Wo Contrast  Result Date: 06/30/2021 CLINICAL DATA:  Chest pain radiating to the back and shoulder with elevated blood pressure EXAM: CT ANGIOGRAPHY CHEST, ABDOMEN AND PELVIS TECHNIQUE: Non-contrast CT of the chest was initially obtained. Multidetector CT imaging through the chest, abdomen and pelvis was  performed using the standard protocol during bolus administration of intravenous contrast. Multiplanar reconstructed images and MIPs were obtained and reviewed to evaluate the vascular anatomy. CONTRAST:  117m OMNIPAQUE IOHEXOL 350 MG/ML SOLN COMPARISON:  Chest x-ray 06/30/2021, CT chest 05/13/2019, 02/16/2021, CT abdomen and pelvis 09/23/2013 FINDINGS: CTA CHEST FINDINGS Cardiovascular: Non contrasted images of the chest demonstrate no acute intramural hematoma. Moderate aortic atherosclerosis. No aneurysm or dissection. Coronary vascular calcifications. Borderline cardiomegaly. No sizable pericardial effusion Mediastinum/Nodes: Midline trachea. No thyroid mass. No suspicious adenopathy. Esophagus unremarkable Lungs/Pleura: Small left-sided pleural effusion with partial atelectasis in the left lower lobe. Scattered bilateral areas of mild bronchiectasis and tree-in-bud density. Probable right middle lobe and lingular scarring. No acute consolidation. Musculoskeletal: Sternum is intact. Vertebral body heights are normal. Old appearing fracture deformities involving the right third, fourth, and fifth anterior ribs. Stable sclerotic lesion in the T6 vertebral body. Review of the MIP images confirms the above findings. CTA ABDOMEN AND PELVIS FINDINGS VASCULAR Aorta: Moderate aortic atherosclerosis. No aneurysm, dissection, or occlusive disease. Celiac: Origin calcification without significant stenosis. No aneurysm or dissection. SMA: Patent without evidence of aneurysm, dissection, vasculitis or significant stenosis. Renals: Single right and single left renal arteries. Calcification at the origin of right renal artery with mild narrowing. No hemodynamically significant stenosis. IMA: Patent without evidence of aneurysm, dissection, vasculitis or significant stenosis. Inflow: Moderate atherosclerosis without aneurysm, dissection, or occlusive disease. Review of the MIP images confirms the above findings. NON-VASCULAR  Hepatobiliary: Subcentimeter hypodensity in the inferior right hepatic lobe too small to further characterize. No calcified gallstone or biliary dilatation Pancreas: Unremarkable. No pancreatic ductal dilatation or surrounding inflammatory changes. Spleen: Normal in size without focal abnormality. Adrenals/Urinary Tract: Adrenal glands are normal. No hydronephrosis. Cortical scarring left kidney. The bladder is unremarkable. Stomach/Bowel: Stomach is within normal limits. Appendix appears normal. No evidence of bowel wall thickening, distention, or inflammatory changes. Lymphatic: No significantly enlarged lymph nodes. Reproductive: Uterus and bilateral adnexa are unremarkable. Other: Negative for pelvic effusion or free air. Musculoskeletal: No acute osseous abnormality. Grade 1 anterolisthesis L4 on L5. Review of the MIP images confirms the above findings. IMPRESSION: 1. Negative for acute aortic dissection or aneurysm. No acute aortic occlusive disease. 2. Moderate aortic atherosclerosis without hemodynamically significant stenosis within the abdomen or pelvis. 3. Chronic lung disease with areas of bronchiectasis and tree-in-bud density suggesting chronic/indolent infection, possible MAC. 4. Small left-sided pleural effusion Electronically Signed   By: KDonavan FoilM.D.   On: 06/30/2021 17:58   ECHOCARDIOGRAM LIMITED  Result Date: 07/29/2021    ECHOCARDIOGRAM LIMITED REPORT   Patient Name:   Barbara CALLIESDate of Exam: 07/29/2021 Medical Rec #:  [id number]   Height:       60.0 in Accession #:    [id number]  Weight:       127.6 lb Date of Birth:  [birth year]    BSA:          1.542 m Patient Age:    89years     BP:  139/93 mmHg Patient Gender: F            HR:           58 bpm. Exam Location:  Inpatient Procedure: Limited Echo, Limited Color Doppler and Cardiac Doppler Indications:    pericardial effusion  History:        Patient has prior history of Echocardiogram examinations, most                  recent 07/28/2021. CAD, Aortic Valve Disease; Risk                 Factors:Hypertension and Dyslipidemia.  Sonographer:    Johny Chess RDCS Referring Phys: [place]  1. Left ventricular ejection fraction, by estimation, is 60 to 65%. The left ventricle has normal function.  2. Right ventricular systolic function is normal. The right ventricular size is normal. There is normal pulmonary artery systolic pressure.  3. Pericardial effusion, overall small Largest along inferior surface of RV/RA,particularly at sulcus. Compared to echo from 07/28/21, no significant change No evidence of hemodynamic compromise by echo criteria.  4. Mild mitral valve regurgitation.  5. AV is thickened, calcified with restricted motion. Peak and mean gradients through the valve are 37 and 22 mm Hg respectively.. The aortic valve is tricuspid. Aortic valve regurgitation is mild.  6. The inferior vena cava is normal in size with greater than 50% respiratory variability, suggesting right atrial pressure of 3 mmHg. FINDINGS  Left Ventricle: Left ventricular ejection fraction, by estimation, is 60 to 65%. The left ventricle has normal function. Right Ventricle: The right ventricular size is normal. Right ventricular systolic function is normal. There is normal pulmonary artery systolic pressure. The tricuspid regurgitant velocity is 2.42 m/s, and with an assumed right atrial pressure of 3 mmHg,  the estimated right ventricular systolic pressure is 03.5 mmHg. Left Atrium: Left atrial size was normal in size. Right Atrium: Right atrial size was normal in size. Pericardium: Pericardial effusion, overall small Largest along inferior surface of RV/RA,particularly at sulcus. Compared to echo from 07/28/21, no significant change No evidence of hemodynamic compromise by echo criteria. Mitral Valve: There is mild thickening of the mitral valve leaflet(s). Mild to moderate mitral annular calcification. Mild mitral valve  regurgitation. Tricuspid Valve: The tricuspid valve is normal in structure. Tricuspid valve regurgitation is trivial. Aortic Valve: AV is thickened, calcified with restricted motion. Peak and mean gradients through the valve are 37 and 22 mm Hg respectively. The aortic valve is tricuspid. Aortic valve regurgitation is mild. Aortic regurgitation PHT measures 430 msec. Aortic valve mean gradient measures 22.0 mmHg. Aortic valve peak gradient measures 37.0 mmHg. Pulmonic Valve: The pulmonic valve was grossly normal. Aorta: The aortic root is normal in size and structure. Venous: The inferior vena cava is normal in size with greater than 50% respiratory variability, suggesting right atrial pressure of 3 mmHg. IAS/Shunts: No atrial level shunt detected by color flow Doppler. IVC IVC diam: 1.20 cm AORTIC VALVE AV Vmax:      304.00 cm/s AV Vmean:     215.000 cm/s AV VTI:       0.602 m AV Peak Grad: 37.0 mmHg AV Mean Grad: 22.0 mmHg AI PHT:       430 msec MITRAL VALVE                TRICUSPID VALVE MV Area (PHT): 2.73 cm     TR Peak grad:   23.4 mmHg MV Decel Time:  278 msec     TR Vmax:        242.00 cm/s MV E velocity: 107.00 cm/s MV A velocity: 143.00 cm/s MV E/A ratio:  0.75 Dorris Carnes MD Electronically signed by Dorris Carnes MD Signature Date/Time: 07/29/2021/5:52:51 PM    Final    ECHOCARDIOGRAM LIMITED  Result Date: 07/28/2021    ECHOCARDIOGRAM LIMITED REPORT   Patient Name:   Barbara Mcconnell Date of Exam: 07/28/2021 Medical Rec #:  [id number]    Height:       60.0 in Accession #:    [id number]   Weight:       129.7 lb Date of Birth:  1934-10-03     BSA:          1.553 m Patient Age:    62 years     BP:           190/87 mmHg Patient Gender: F            HR:           78 bpm. Exam Location:  Inpatient Procedure: Limited Echo, Limited Color Doppler and Cardiac Doppler Indications:     Pericardial effusion I31.3  History:         Patient has prior history of Echocardiogram examinations, most                  recent 07/22/2021.  Takotsubo cardiomyopathy, CAD; Risk                  Factors:Dyslipidemia and Hypertension.  Sonographer:     Bernadene Person RDCS Referring Phys:  [id number] [place] Diagnosing Phys: Gwyndolyn Kaufman MD IMPRESSIONS  1. RV free wall not visualized, however, there appears to be only a trivial-to-small pericardial effusion present (best seen posterior to the LV). There is a prominent fat pad.  2. Left ventricular ejection fraction, by estimation, is 60 to 65%. The left ventricle has normal function. The left ventricle has no regional wall motion abnormalities. There is moderate left ventricular hypertrophy.  3. The aortic valve is tricuspid. There is moderate calcification of the aortic valve. There is moderate thickening of the aortic valve.  4. The inferior vena cava is dilated in size with >50% respiratory variability, suggesting right atrial pressure of 8 mmHg. Comparison(s): Compared to prior TTE on 07/22/21, the pericardial effusion is now significantly improved and now appears trivial to small (was previously moderate measuring up to 1.5cm). FINDINGS  Left Ventricle: Left ventricular ejection fraction, by estimation, is 60 to 65%. The left ventricle has normal function. The left ventricle has no regional wall motion abnormalities. There is moderate left ventricular hypertrophy. Pericardium: A small pericardial effusion is present. Presence of epicardial fat layer. Aortic Valve: The aortic valve is tricuspid. There is moderate calcification of the aortic valve. There is moderate thickening of the aortic valve. Venous: The inferior vena cava is dilated in size with greater than 50% respiratory variability, suggesting right atrial pressure of 8 mmHg. Gwyndolyn Kaufman MD Electronically signed by Gwyndolyn Kaufman MD Signature Date/Time: 07/28/2021/1:13:36 PM    Final (Updated)    US Abdomen Limited RUQ (LIVER/GB)  Result Date: 07/21/2021 CLINICAL DATA:  Elevated LFTs. EXAM: ULTRASOUND ABDOMEN LIMITED RIGHT  UPPER QUADRANT COMPARISON:  CT dated 05/13/2019. FINDINGS: Gallbladder: No gallstones or wall thickening visualized. No sonographic Murphy sign noted by sonographer. Common bile duct: Diameter: 5 mm Liver: The liver demonstrates a coarsened echotexture with normal echogenicity. There is slight irregularity of the liver contour.  Clinical correlation is recommended to evaluate for early changes of cirrhosis. Portal vein is patent on color Doppler imaging with normal direction of blood flow towards the liver. Other: None. IMPRESSION: Coarsened liver echotexture, otherwise unremarkable right upper quadrant ultrasound. Electronically Signed   By: Anner Crete M.D.   On: 07/21/2021 23:52     TODAY-DAY OF DISCHARGE:  Subjective:   Barbara Mcconnell today has no headache,no chest abdominal pain,no new weakness tingling or numbness, feels much better wants to go home today.  Objective:   Blood pressure (!) 106/54, pulse 86, temperature 97.9 F (36.6 C), temperature source Oral, resp. rate 20, height 5' (1.524 m), weight 56.1 kg, SpO2 100 %.  Intake/Output Summary (Last 24 hours) at 07/30/2021 1015 Last data filed at 07/29/2021 2111 Gross per 24 hour  Intake 120 ml  Output --  Net 120 ml   Filed Weights   07/29/21 0500 07/29/21 2100 07/30/21 0538  Weight: 57.9 kg 56.1 kg 56.1 kg    Exam: Awake Alert, Oriented *3, No new F.N deficits, Normal affect [place].AT,PERRAL Supple Neck,No JVD, No cervical lymphadenopathy appriciated.  Symmetrical Chest wall movement, Good air movement bilaterally, CTAB RRR,No Gallops,Rubs or new Murmurs, No Parasternal Heave +ve B.Sounds, Abd Soft, Non tender, No organomegaly appriciated, No rebound -guarding or rigidity. No Cyanosis, Clubbing or edema, No new Rash or bruise   PERTINENT RADIOLOGIC STUDIES: ECHOCARDIOGRAM LIMITED  Result Date: 07/29/2021    ECHOCARDIOGRAM LIMITED REPORT   Patient Name:   Barbara Mcconnell Date of Exam: 07/29/2021 Medical Rec #:  [id number]     Height:       60.0 in Accession #:    [id number]   Weight:       127.6 lb Date of Birth:  02-26-1935     BSA:          1.542 m Patient Age:    31 years     BP:           139/93 mmHg Patient Gender: F            HR:           58 bpm. Exam Location:  Inpatient Procedure: Limited Echo, Limited Color Doppler and Cardiac Doppler Indications:    pericardial effusion  History:        Patient has prior history of Echocardiogram examinations, most                 recent 07/28/2021. CAD, Aortic Valve Disease; Risk                 Factors:Hypertension and Dyslipidemia.  Sonographer:    Johny Chess RDCS Referring Phys: [place]  1. Left ventricular ejection fraction, by estimation, is 60 to 65%. The left ventricle has normal function.  2. Right ventricular systolic function is normal. The right ventricular size is normal. There is normal pulmonary artery systolic pressure.  3. Pericardial effusion, overall small Largest along inferior surface of RV/RA,particularly at sulcus. Compared to echo from 07/28/21, no significant change No evidence of hemodynamic compromise by echo criteria.  4. Mild mitral valve regurgitation.  5. AV is thickened, calcified with restricted motion. Peak and mean gradients through the valve are 37 and 22 mm Hg respectively.. The aortic valve is tricuspid. Aortic valve regurgitation is mild.  6. The inferior vena cava is normal in size with greater than 50% respiratory variability, suggesting right atrial pressure of 3 mmHg. FINDINGS  Left Ventricle: Left ventricular ejection fraction,  by estimation, is 60 to 65%. The left ventricle has normal function. Right Ventricle: The right ventricular size is normal. Right ventricular systolic function is normal. There is normal pulmonary artery systolic pressure. The tricuspid regurgitant velocity is 2.42 m/s, and with an assumed right atrial pressure of 3 mmHg,  the estimated right ventricular systolic pressure is 65.5 mmHg. Left Atrium:  Left atrial size was normal in size. Right Atrium: Right atrial size was normal in size. Pericardium: Pericardial effusion, overall small Largest along inferior surface of RV/RA,particularly at sulcus. Compared to echo from 07/28/21, no significant change No evidence of hemodynamic compromise by echo criteria. Mitral Valve: There is mild thickening of the mitral valve leaflet(s). Mild to moderate mitral annular calcification. Mild mitral valve regurgitation. Tricuspid Valve: The tricuspid valve is normal in structure. Tricuspid valve regurgitation is trivial. Aortic Valve: AV is thickened, calcified with restricted motion. Peak and mean gradients through the valve are 37 and 22 mm Hg respectively. The aortic valve is tricuspid. Aortic valve regurgitation is mild. Aortic regurgitation PHT measures 430 msec. Aortic valve mean gradient measures 22.0 mmHg. Aortic valve peak gradient measures 37.0 mmHg. Pulmonic Valve: The pulmonic valve was grossly normal. Aorta: The aortic root is normal in size and structure. Venous: The inferior vena cava is normal in size with greater than 50% respiratory variability, suggesting right atrial pressure of 3 mmHg. IAS/Shunts: No atrial level shunt detected by color flow Doppler. IVC IVC diam: 1.20 cm AORTIC VALVE AV Vmax:      304.00 cm/s AV Vmean:     215.000 cm/s AV VTI:       0.602 m AV Peak Grad: 37.0 mmHg AV Mean Grad: 22.0 mmHg AI PHT:       430 msec MITRAL VALVE                TRICUSPID VALVE MV Area (PHT): 2.73 cm     TR Peak grad:   23.4 mmHg MV Decel Time: 278 msec     TR Vmax:        242.00 cm/s MV E velocity: 107.00 cm/s MV A velocity: 143.00 cm/s MV E/A ratio:  0.75 Dorris Carnes MD Electronically signed by Dorris Carnes MD Signature Date/Time: 07/29/2021/5:52:51 PM    Final    ECHOCARDIOGRAM LIMITED  Result Date: 07/28/2021    ECHOCARDIOGRAM LIMITED REPORT   Patient Name:   Barbara Mcconnell Date of Exam: 07/28/2021 Medical Rec #:  [id number]    Height:       60.0 in Accession #:     [id number]   Weight:       129.7 lb Date of Birth:  [birth year]     BSA:          1.553 m Patient Age:    27 years     BP:           190/87 mmHg Patient Gender: F            HR:           78 bpm. Exam Location:  Inpatient Procedure: Limited Echo, Limited Color Doppler and Cardiac Doppler Indications:     Pericardial effusion I31.3  History:         Patient has prior history of Echocardiogram examinations, most                  recent 07/22/2021. Takotsubo cardiomyopathy, CAD; Risk  Factors:Dyslipidemia and Hypertension.  Sonographer:     Bernadene Person RDCS Referring Phys:  [id number] [place] Diagnosing Phys: Gwyndolyn Kaufman MD IMPRESSIONS  1. RV free wall not visualized, however, there appears to be only a trivial-to-small pericardial effusion present (best seen posterior to the LV). There is a prominent fat pad.  2. Left ventricular ejection fraction, by estimation, is 60 to 65%. The left ventricle has normal function. The left ventricle has no regional wall motion abnormalities. There is moderate left ventricular hypertrophy.  3. The aortic valve is tricuspid. There is moderate calcification of the aortic valve. There is moderate thickening of the aortic valve.  4. The inferior vena cava is dilated in size with >50% respiratory variability, suggesting right atrial pressure of 8 mmHg. Comparison(s): Compared to prior TTE on 07/22/21, the pericardial effusion is now significantly improved and now appears trivial to small (was previously moderate measuring up to 1.5cm). FINDINGS  Left Ventricle: Left ventricular ejection fraction, by estimation, is 60 to 65%. The left ventricle has normal function. The left ventricle has no regional wall motion abnormalities. There is moderate left ventricular hypertrophy. Pericardium: A small pericardial effusion is present. Presence of epicardial fat layer. Aortic Valve: The aortic valve is tricuspid. There is moderate calcification of the aortic valve. There  is moderate thickening of the aortic valve. Venous: The inferior vena cava is dilated in size with greater than 50% respiratory variability, suggesting right atrial pressure of 8 mmHg. Gwyndolyn Kaufman MD Electronically signed by Gwyndolyn Kaufman MD Signature Date/Time: 07/28/2021/1:13:36 PM    Final (Updated)      PERTINENT LAB RESULTS: CBC: Recent Labs    07/28/21 0716 07/30/21 0128  WBC 7.2 8.9  HGB 11.4* 11.4*  HCT 33.3* 34.0*  PLT 352 394   CMET CMP     Component Value Date/Time   NA 125 (L) 07/30/2021 0128   NA 136 05/31/2019 1428   K 4.0 07/30/2021 0128   CL 91 (L) 07/30/2021 0128   CO2 26 07/30/2021 0128   GLUCOSE 99 07/30/2021 0128   BUN 20 07/30/2021 0128   BUN 20 05/31/2019 1428   CREATININE 1.06 (H) 07/30/2021 0128   CALCIUM 8.7 (L) 07/30/2021 0128   PROT 5.7 (L) 07/30/2021 0128   ALBUMIN 2.6 (L) 07/30/2021 0128   AST 15 07/30/2021 0128   ALT 22 07/30/2021 0128   ALKPHOS 66 07/30/2021 0128   BILITOT 0.4 07/30/2021 0128   GFRNONAA 52 (L) 07/30/2021 0128   GFRAA 65 05/31/2019 1428    GFR Estimated Creatinine Clearance: 31 mL/min (A) (by C-G formula based on SCr of 1.06 mg/dL (H)). No results for input(s): LIPASE, AMYLASE in the last 72 hours. No results for input(s): CKTOTAL, CKMB, CKMBINDEX, TROPONINI in the last 72 hours. Invalid input(s): POCBNP No results for input(s): DDIMER in the last 72 hours. No results for input(s): HGBA1C in the last 72 hours. No results for input(s): CHOL, HDL, LDLCALC, TRIG, CHOLHDL, LDLDIRECT in the last 72 hours. No results for input(s): TSH, T4TOTAL, T3FREE, THYROIDAB in the last 72 hours.  Invalid input(s): FREET3 No results for input(s): VITAMINB12, FOLATE, FERRITIN, TIBC, IRON, RETICCTPCT in the last 72 hours. Coags: Recent Labs    07/30/21 0128  INR 1.1   Microbiology: Recent Results (from the past 240 hour(s))  Resp Panel by RT-PCR (Flu A&B, Covid) Nasopharyngeal Swab     Status: None   Collection Time:  07/21/21  7:48 PM   Specimen: Nasopharyngeal Swab; Nasopharyngeal(NP) swabs in vial transport  medium  Result Value Ref Range Status   SARS Coronavirus 2 by RT PCR NEGATIVE NEGATIVE Final    Comment: (NOTE) SARS-CoV-2 target nucleic acids are NOT DETECTED.  The SARS-CoV-2 RNA is generally detectable in upper respiratory specimens during the acute phase of infection. The lowest concentration of SARS-CoV-2 viral copies this assay can detect is 138 copies/mL. A negative result does not preclude SARS-Cov-2 infection and should not be used as the sole basis for treatment or other patient management decisions. A negative result may occur with  improper specimen collection/handling, submission of specimen other than nasopharyngeal swab, presence of viral mutation(s) within the areas targeted by this assay, and inadequate number of viral copies(<138 copies/mL). A negative result must be combined with clinical observations, patient history, and epidemiological information. The expected result is Negative.  Fact Sheet for Patients:  EntrepreneurPulse.com.au  Fact Sheet for Healthcare Providers:  IncredibleEmployment.be  This test is no t yet approved or cleared by the [place] FDA and  has been authorized for detection and/or diagnosis of SARS-CoV-2 by FDA under an Emergency Use Authorization (EUA). This EUA will remain  in effect (meaning this test can be used) for the duration of the COVID-19 declaration under Section 564(b)(1) of the Act, 21 U.S.C.section 360bbb-3(b)(1), unless the authorization is terminated  or revoked sooner.       Influenza A by PCR NEGATIVE NEGATIVE Final   Influenza B by PCR NEGATIVE NEGATIVE Final    Comment: (NOTE) The Xpert Xpress SARS-CoV-2/FLU/RSV plus assay is intended as an aid in the diagnosis of influenza from Nasopharyngeal swab specimens and should not be used as a sole basis for treatment. Nasal washings  and aspirates are unacceptable for Xpert Xpress SARS-CoV-2/FLU/RSV testing.  Fact Sheet for Patients: EntrepreneurPulse.com.au  Fact Sheet for Healthcare Providers: IncredibleEmployment.be  This test is not yet approved or cleared by the [place] FDA and has been authorized for detection and/or diagnosis of SARS-CoV-2 by FDA under an Emergency Use Authorization (EUA). This EUA will remain in effect (meaning this test can be used) for the duration of the COVID-19 declaration under Section 564(b)(1) of the Act, 21 U.S.C. section 360bbb-3(b)(1), unless the authorization is terminated or revoked.  Performed at [facility], [place] [address] [place], [place] [address]   Body fluid culture w Gram Stain     Status: None (Preliminary result)   Collection Time: 07/28/21  8:15 AM   Specimen: PATH Cytology Misc. fluid; Body Fluid  Result Value Ref Range Status   Specimen Description FLUID  Final   Special Requests NONE  Final   Gram Stain NO WBC SEEN NO ORGANISMS SEEN   Final   Culture   Final    NO GROWTH 2 DAYS Performed at [facility], [address] [place],  [address]    Report Status PENDING  Incomplete    FURTHER DISCHARGE INSTRUCTIONS:  Get Medicines reviewed and adjusted: Please take all your medications with you for your next visit with your Primary MD  Laboratory/radiological data: Please request your Primary MD to go over all hospital tests and procedure/radiological results at the follow up, please ask your Primary MD to get all Hospital records sent to his/her office.  In some cases, they will be blood work, cultures and biopsy results pending at the time of your discharge. Please request that your primary care M.D. goes through all the records of your hospital data and follows up on these results.  Also Note the following:  If you experience worsening of your admission symptoms, develop  shortness of breath, life threatening emergency, suicidal or homicidal thoughts you must seek medical attention immediately by calling 911 or calling your MD immediately  if symptoms less severe.  You must read complete instructions/literature along with all the possible adverse reactions/side effects for all the Medicines you take and that have been prescribed to you. Take any new Medicines after you have completely understood and accpet all the possible adverse reactions/side effects.   Do not drive when taking Pain medications or sleeping medications (Benzodaizepines)  Do not take more than prescribed Pain, Sleep and Anxiety Medications. It is not advisable to combine anxiety,sleep and pain medications without talking with your primary care practitioner  Special Instructions: If you have smoked or chewed Tobacco  in the last 2 yrs please stop smoking, stop any regular Alcohol  and or any Recreational drug use.  Wear Seat belts while driving.  Please note: You were cared for by a hospitalist during your hospital stay. Once you are discharged, your primary care physician will handle any further medical issues. Please note that NO REFILLS for any discharge medications will be authorized once you are discharged, as it is imperative that you return to your primary care physician (or establish a relationship with a primary care physician if you do not have one) for your post hospital discharge needs so that they can reassess your need for medications and monitor your lab values.  Total Time spent coordinating discharge including counseling, education and face to face time equals 35 minutes.  SignedOren Binet 07/30/2021 10:15 AM

## 2021-07-30 NOTE — Care Management Important Message (Signed)
Important Message  Patient Details  Name: Barbara Mcconnell MRN: [id number] Date of Birth: [birth year]   Medicare Important Message Given:  Yes     Renie Ora 07/30/2021, 11:23 AM

## 2021-07-30 NOTE — Progress Notes (Signed)
Patient discharging home. Vital signs stable at time of discharge. Discharge instructions given and verbal understanding returned. No questions at this time. 

## 2021-07-31 LAB — BODY FLUID CULTURE W GRAM STAIN
Culture: NO GROWTH
Gram Stain: NONE SEEN

## 2021-08-01 ENCOUNTER — Other Ambulatory Visit: Payer: Self-pay

## 2021-08-01 DIAGNOSIS — I350 Nonrheumatic aortic (valve) stenosis: Secondary | ICD-10-CM

## 2021-08-02 ENCOUNTER — Other Ambulatory Visit (HOSPITAL_COMMUNITY): Payer: Medicare HMO

## 2021-08-02 ENCOUNTER — Other Ambulatory Visit: Payer: Self-pay

## 2021-08-02 ENCOUNTER — Encounter (HOSPITAL_COMMUNITY): Payer: Self-pay | Admitting: Internal Medicine

## 2021-08-02 DIAGNOSIS — I350 Nonrheumatic aortic (valve) stenosis: Secondary | ICD-10-CM

## 2021-08-02 MED ORDER — POTASSIUM CHLORIDE 2 MEQ/ML IV SOLN
80.0000 meq | INTRAVENOUS | Status: DC
  Filled 2021-08-02: qty 40

## 2021-08-02 MED ORDER — HEPARIN 30,000 UNITS/1000 ML (OHS) CELLSAVER SOLUTION
Status: DC
  Filled 2021-08-02: qty 1000

## 2021-08-02 MED ORDER — DEXMEDETOMIDINE HCL IN NACL 400 MCG/100ML IV SOLN
0.1000 ug/kg/h | INTRAVENOUS | Status: AC
  Administered 2021-08-03: 1 ug/kg/h via INTRAVENOUS
  Filled 2021-08-02 (×2): qty 100

## 2021-08-02 MED ORDER — MAGNESIUM SULFATE 50 % IJ SOLN
40.0000 meq | INTRAMUSCULAR | Status: DC
  Filled 2021-08-02: qty 9.85

## 2021-08-02 MED ORDER — CEFAZOLIN SODIUM-DEXTROSE 2-4 GM/100ML-% IV SOLN
2.0000 g | INTRAVENOUS | Status: AC
  Administered 2021-08-03: 2 g via INTRAVENOUS
  Filled 2021-08-02 (×3): qty 100

## 2021-08-02 MED ORDER — NOREPINEPHRINE 4 MG/250ML-% IV SOLN
0.0000 ug/min | INTRAVENOUS | Status: AC
  Administered 2021-08-03: 1 ug/min via INTRAVENOUS
  Filled 2021-08-02: qty 250

## 2021-08-02 NOTE — H&P (Signed)
[address]       ,[place] [address]             [phone]      Cardiothoracic Surgery Admission History and Physical   Patient ID: Barbara Mcconnell MRN: [id number]; DOB: 05-28-1935   Admit date: 07/21/2021 Date of Consult: 07/27/2021   Primary Care Provider: Kristopher Mcconnell., MD [facility] Cardiologist: Barbara Martinique, MD    Patient Profile:    Barbara Mcconnell is a 87 y.o. female with a hx of HTN, HLD with statin intolerance, peripheral vascular disease with moderate carotid artery stenosis and hx of Takotsubo cardiomyopathy with normalization who is being seen today for the evaluation of symptomatic, moderate aortic stenosis at the request of Dr. Ali Mcconnell.   History of Present Illness:    Barbara Mcconnell is a very functional 87yo who lives alone in [place]. She has one daughter who lives locally and is very involved with her care. She was initially seen by Dr. Martinique after presenting to [facility] 05/13/2019 with chest pain. She had previously been hospitalized about 5 years prior and was told that she had Takotsubo cardiomyopathy with mild non-obstructive CAD which had been treated medically. She reported normalization of her cardiac function although there are no records of her prior cath or echocardiogram reports. She had been doing very well but developed sub sternal chest pain with weakness and diaphoresis and presented to [facility] 04/2019 for further evaluation, found to have non-obstructive CAD per cardiac catheterization with severe LV dysfunction at 35-40% with wall motion abnormalities consistent with Takotsubo's cardiomyopathy. Echocardiogram 05/14/19 confirmed EF at 35-40% with moderate AS with a mean gradient at 21.5 mmHg, peak gradient at 44.7 mmHg, and AVA by VTI measuring 2.64 cm. Her medications were adjusted and she was seen in cardiology follow up several times in 2020 however has been lost to follow up until present admission. It appears that she had a follow up  echocardiogram 09/2019 which showed normalized LV function to 60-65% with no wall motion abnormalities moderate to severe aortic valve stenosis with AVA at 0.85 cm, mean gradient measures 20.5 mmHg and peak gradient at 31.17mHg.    She reports that up until about 5 weeks ago she was doing very well with no issues at all. She began to notice more exertional dyspnea. Activities which used to be easy for her became much harder due to SOB. She denies chest pain, palpitations, orthopnea, LE edema, dizziness, or syncope. She participates in regular exercise with silver sneakers three times per week without an issues. She performs all her own housework and continues to drive herself. More recently, as her symptoms progressed, she followed closely with her PCP along with a pulmonary team. 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 She then saw her PCP 07/21/21 and was found to be tachypneic and in mild respiratory distress and was therefore sent to the ED for further assessment. On arrival she was quite hypertensive with a BP at 201/109. Bedside echocardiogram performed by EDP showed pericardial effusion. CXR showed vascular congestion and small bilateral pleural effusion. BNP 201. HsT 63>>56>>51>>48. LFTs elevated on arrival however down trended  with diuresis. Creatine 0.93>>1.09>>1.00. Right upper quarter ultrasound was unremarkable. Repeat formal echocardiogram showed normal LVEF with a moderate circumfrential pericardial effusion measuring 1.5 cm. There was moderate AS with a mean gradient at 360mg (was 20.44m44m), AVA 1.38cm  and DI 0.29. She underwent [facility] 07/23/21 which showed stable, mild to moderate CAD with hemodynamics consistent with pulmonary hypertension. MD unable to cross aortic valve during procedure. Due to moderate AS, she was initially felt to be a good PROGRESS trial candidate however with  discrepancies between echocardiogram findings and inability to cross AV, pre-TAVR CT imaging was obtained. These showed an AVA 1.01cm2, annular area measuring 4101m2, and large circumferential pericardial effusion. After further discussion, she opted to defer PROGRESS trail consideration and move forward with TAVR given progressive symptoms, LVH with small LV cavity and calcium score at 2093. Her hospital stay was further complicated by new onset atrial fibrillation with RVR. She was placed on diltiazem infusion with relatively quick conversion to NSR. Additionally, she had pericardiocentesis on 07/28/21 removing 400 cc of dark bloody fluid. Cytology of that is still pending.    She has been evaluated by our multidisciplinary valve team and felt to have symptomatic, stage D aortic stenosis with Class III symptoms and felt to be a suitable candidate for TAVR.        Past Medical History:  Diagnosis Date   Aortic stenosis     CAD (coronary artery disease)      cath 04/2019: 50% pRCA   Coronary artery disease     Hyperlipemia     Hypertension     Takotsubo cardiomyopathy             Past Surgical History:  Procedure Laterality Date   RIGHT/LEFT HEART CATH AND CORONARY ANGIOGRAPHY N/A 05/13/2019    Procedure: RIGHT/LEFT HEART CATH AND CORONARY ANGIOGRAPHY;  Surgeon: Barbara Barbara M, MD;  Location: [facility];  Service: Cardiovascular;  Laterality: N/A;   RIGHT/LEFT HEART CATH AND CORONARY ANGIOGRAPHY N/A 07/23/2021    Procedure: RIGHT/LEFT HEART CATH AND CORONARY ANGIOGRAPHY;  Surgeon: Barbara Booze MD;  Location[facility];  Service: Cardiovascular;  Laterality: N/A;   TONSILLECTOMY        Inpatient Medications: Scheduled Meds:  aspirin EC  81 mg Oral Daily   azithromycin  250 mg Oral Daily   cholecalciferol  1,000 Units Oral Daily   diltiazem  120 mg Oral Daily   docusate sodium  100 mg Oral Q breakfast   escitalopram  5 mg Oral Daily   lidocaine  1 patch  Transdermal Q24H   lisinopril  25 mg Oral Daily   loratadine  10 mg Oral Daily   metoprolol tartrate  25 mg Oral BID   montelukast  10 mg Oral QPC supper   pantoprazole  40 mg Oral Daily   polyethylene glycol  17 g Oral BID   sodium chloride flush  3 mL Intravenous Q12H   sodium chloride flush  3 mL Intravenous Q12H   sorbitol, milk of mag, mineral oil, glycerin (SMOG) enema  400 mL Rectal Once    Continuous Infusions:  sodium chloride     sodium chloride     sodium chloride      PRN Meds: sodium chloride, sodium chloride, acetaminophen, acetaminophen, albuterol, ondansetron (ZOFRAN) IV, sodium chloride flush, sodium chloride flush   Allergies:         Allergies  Allergen Reactions   Codeine Nausea And Vomiting and Nausea Only   Cyclobenzaprine     Iodinated Contrast Media     Prednisone Other (See Comments)      Sores in mouth      Social History:   Social History  Socioeconomic History   Marital status: Divorced      Spouse name: Not on file   Number of children: Not on file   Years of education: Not on file   Highest education level: Not on file  Occupational History   Not on file  Tobacco Use   Smoking status: Never   Smokeless tobacco: Never  Vaping Use   Vaping Use: Never used  Substance and Sexual Activity   Alcohol use: No   Drug use: No   Sexual activity: Not Currently  Other Topics Concern   Not on file  Social History Narrative   Not on file    Social Determinants of Health    Financial Resource Strain: Not on file  Food Insecurity: Not on file  Transportation Needs: Not on file  Physical Activity: Not on file  Stress: Not on file  Social Connections: Not on file  Intimate Partner Violence: Not on file    Family History:          Family History  Problem Relation Age of Onset   Hypertension Other      ROS:  Please see the history of present illness.    All other ROS reviewed and negative.      Physical Exam/Data:           Vitals:    07/26/21 1327 07/26/21 2034 07/27/21 0500 07/27/21 0632  BP:   (!) 157/78   (!) 163/81  Pulse:   90   95  Resp:   18   18  Temp:   97.7 F (36.5 C)   97.8 F (36.6 C)  TempSrc:   Oral   Oral  SpO2: 98% 98%   98%  Weight:     58.7 kg    Height:            No intake or output data in the 24 hours ending 07/27/21 0841 Last 3 Weights 07/27/2021 07/26/2021 07/25/2021  Weight (lbs) 129 lb 8 oz 129 lb 9.6 oz 129 lb 3.2 oz  Weight (kg) 58.741 kg 58.786 kg 58.605 kg     Body mass index is 25.29 kg/Mcconnell.    General: Well developed, well nourished, NAD Neck: No JVD Lungs:Clear to ausculation bilaterally. Breathing is unlabored. Cardiovascular: RRR with S1 S2. + systolic murmur. Abdomen: Soft, non-tender, non-distended. No obvious abdominal masses. Extremities: No edema.  Neuro: Alert and oriented. No focal deficits. No facial asymmetry. MAE spontaneously. Psych: Responds to questions appropriately with normal affect.     EKG:  The EKG was personally reviewed and demonstrates:  07/26/20 NSR with HR 79bpm, evidence of LVH Telemetry:  Telemetry was personally reviewed and demonstrates:  07/27/20 NSR with infrequent PVCs, HR 70-90s.    Relevant CV Studies:   Coronary CT 07/24/21:   IMPRESSION: 1. Tri leaflet AV with calcium score 2093 and planimetered AVA 1.01 cm2   2.  Annular area of 407 mm2 suitable for a 23 mm Sapien 3 valve   3.  Coronary arteries sufficient height above annulus for deployment   4. Optimal angiographic angle for deployment LAO 20 Caudal 19 degrees   5.  Large circumferential pericardial effusion   CT Chest 07/24/21:   IMPRESSION: No evidence of thoracoabdominal aortic aneurysm or dissection.   Peripheral vascular disease with greater than 90% stenosis of the proximal right internal carotid artery. Plaque morphology appears irregular and may be better assessed with dedicated carotid artery Doppler sonography. 50% stenosis of the a dominant left  vertebral artery at its origin. 50-75% stenosis of the inferior mesenteric artery at its origin, though this is of questionable clinical significance given patency of the celiac axis and superior mesenteric artery.   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   Cardiomegaly with left ventricular hypertrophy. Extensive calcification of the aortic valve leaflets. Enlarging, moderate pericardial effusion without definite CT evidence of cardiac tamponade. Note, however, small bilateral pleural effusions have developed since prior examination and can be seen the setting of progressive cardiogenic failure. Pericardial fluid appears slightly hyperdense suggesting a proteinaceous or hemorrhagic component.   Cylindrical bronchiectasis and volume loss within the lingula and basilar right middle lobe with associated peribronchial nodularity. This may reflect the sequela of remote inflammation or reflect changes of a chronic infectious process such as atypical mycobacterial or fungal pneumonia.   Multiple subcentimeter arterially enhancing lesions within the liver possibly representing small flash filled hemangioma. These are not optimally characterized on this examination may be better assessed with dedicated MRI examination if clinically indicated.    R/LHC 07/23/21:     Prox RCA lesion is 50% stenosed.   Prox Cx to Mid Cx lesion is 25% stenosed.   Prox LAD to Mid LAD lesion is 25% stenosed.   Hemodynamic findings consistent with pulmonary hypertension.   Stable, mild to moderate coronary artery disease.  No indication for PCI.   Unable to cross aortic valve, consistent with worsening noted on echocardiogram.  Continue plans for TAVR w/u.   Echocardiogram 07/22/21:   IMPRESSIONS Left ventricular ejection fraction, by estimation, is 60 to 65%. The left ventricle has normal function. The left ventricle has no regional wall  motion abnormalities. There is moderate concentric left ventricular hypertrophy. Left ventricular diastolic parameters are consistent with Grade I diastolic dysfunction (impaired relaxation). Elevated left ventricular end-diastolic pressure. 1.Right ventricular systolic function is normal. The right ventricular size is normal. There is normal pulmonary artery systolic pressure. 2. 3. Left atrial size was severely dilated. Pericardial effusion measuring 1.5 cm. Increased compared with 09/2019. . Moderate pericardial effusion. The pericardial effusion is circumferential. There is no evidence of cardiac tamponade. 4. The mitral valve is degenerative. Trivial mitral valve regurgitation. No evidence of mitral stenosis. Moderate mitral annular calcification. 5. AoV not well visualized. Appears calcified. Vmax 3.4 Mcconnell/s, MG 33 mmHG, AVA 1.38 cm2, DI 0.29. Moderate aortic stenosis. Mean gradient has increased from 20 mmHg 09/2019 to 33 mmHg now. The aortic valve is tricuspid. There is moderate calcification of the aortic valve. There is moderate thickening of the aortic valve. Aortic valve regurgitation is mild. Moderate aortic valve stenosis. Aortic valve area, by VTI measures 1.38 cm. Aortic valve mean gradient measures 33.0 mmHg. Aortic valve Vmax measures 3.41 Mcconnell/s. 6. 7. Aortic dilatation noted. There is mild dilatation of the ascending aorta, measuring 37   Echocardiogram 09/24/2019:   1. Left ventricular ejection fraction, by estimation, is 60 to 65%. The left ventricle has normal function. The left ventricle has no regional wall motion abnormalities. There is severe asymmetric left ventricular hypertrophy of the septal segment (Sigmoid basal septum). Left ventricular diastolic parameters are consistent with Grade II diastolic dysfunction (pseudonormalization). 2. Right ventricular systolic function is normal. The right ventricular size is normal. There is normal pulmonary artery systolic  pressure. 3. Left atrial size was mildly dilated. 4. There is mild posterior mitral annular calcification. Mild to moderate mitral valve regurgitation. No evidence of mitral stenosis. 5. The aortic valve is is thickened and moderately  calcified with annular calcification. Aortic valve regurgitation is mild to moderate. Moderate to severe aortic valve stenosis. Aortic valve area, by VTI measures 0.85 cm. Aortic valve mean gradient measures 20.5 mmHg. Aortic valve Vmax measures 2.81 Mcconnell/s. 6. Compared to prior study done is October 2020, there have been improvement in the EF which was 30-35% with wall motion abnormalities. Now EF is 60-65% with no wall motion abnormality. In addition, the Aortic stenosis is now moderate to severe was moderate.   R/LHC 05/13/2019:   Prox RCA lesion is 50% stenosed. There is moderate to severe left ventricular systolic dysfunction. LV end diastolic pressure is moderately elevated. The left ventricular ejection fraction is 35-45% by visual estimate. There is trivial (1+) mitral regurgitation. Hemodynamic findings consistent with moderate pulmonary hypertension.   1. Nonobstructive CAD 2. Moderate to severe LV dysfunction. EF 35-40%. Wall motion abnormality classic for Takotsubo's cardiomyopathy 3. Moderately elevated LV filling pressures 4. Moderate pulmonary venous HTN 5. Reduced cardiac output. CI 2.3   Plan: patient was SOB in the lab. We administered IV Ntg and lasix in the lab and increased oxygen with improvement in her symptoms and improvement in PA sat from 62% to 66%. Will continue with IV diuresis, continue ACEi and Metoprolol. Echo.    Laboratory Data:   High Sensitivity Troponin:   Last Labs          Recent Labs  Lab 06/30/21 1532 07/21/21 1948 07/21/21 2152 07/22/21 0022 07/22/21 0421  TROPONINIHS 13 63* 56* 51* 48*       Chemistry Last Labs           Recent Labs  Lab 07/22/21 0421 07/23/21 0745 07/23/21 1027 07/23/21 0942  07/23/21 0943 07/24/21 0119  NA 131* 129*   < > 128* 128* 129*  K 3.9 4.1   < > 3.9 3.8 4.1  CL 93* 93*  --   --   --  93*  CO2 28 25  --   --   --  27  GLUCOSE 109* 106*  --   --   --  101*  BUN 21 18  --   --   --  22  CREATININE 1.09* 0.95  --   --   --  1.00  CALCIUM 9.1 8.9  --   --   --  9.1  GFRNONAA 50* 59*  --   --   --  56*  ANIONGAP 10 11  --   --   --  9   < > = values in this interval not displayed.      Last Labs       Recent Labs  Lab 07/21/21 1948 07/22/21 0421  PROT 7.1 6.6  ALBUMIN 3.5 3.2*  AST 106* 70*  ALT 121* 99*  ALKPHOS 205* 175*  BILITOT 0.8 0.9      Hematology Last Labs          Recent Labs  Lab 07/21/21 1948 07/22/21 0421 07/23/21 2536 07/23/21 0942 07/23/21 0943  WBC 8.4 7.5  --   --   --   RBC 3.88 3.70*  --   --   --   HGB 11.5* 11.3* 10.5* 10.9* 10.5*  HCT 35.6* 33.6* 31.0* 32.0* 31.0*  MCV 91.8 90.8  --   --   --   MCH 29.6 30.5  --   --   --   MCHC 32.3 33.6  --   --   --   RDW 13.4 13.4  --   --   --  PLT 267 275  --   --   --       BNP Last Labs      Recent Labs  Lab 07/21/21 1948  BNP 201.8*      DDimer  Last Labs      Recent Labs  Lab 07/21/21 1948  DDIMER 1.58*          Radiology/Studies:  CT CHEST W CONTRAST   Result Date: 07/24/2021 CLINICAL DATA:  Thoracic aorta disease, post-op; Aneurysm, pelvis or lower extremity. Aneurysm, pelvis or lower extremity. Severe symptomatic aortic stenosis. EXAM: CT ANGIOGRAPHY CHEST, ABDOMEN AND PELVIS TECHNIQUE: Multidetector CT imaging through the chest, abdomen and pelvis was performed using the standard protocol during bolus administration of intravenous contrast. Multiplanar reconstructed images and MIPs were obtained and reviewed to evaluate the vascular anatomy. CONTRAST:  130m OMNIPAQUE IOHEXOL 350 MG/ML SOLN COMPARISON:  07/18/2021 FINDINGS: CTA CHEST FINDINGS Cardiovascular: The thoracic aorta is of normal caliber, measuring 3.6 x 3.7 cm in its ascending  segment, 2.9 x 2.6 cm within the arch just beyond the takeoff of the left subclavian artery, and 2.6 x 2.2 cm within its descending segment at the level of the left atrium. No intramural hematoma or dissection. Moderate calcified atherosclerotic plaque. Conventional arch vessel anatomic configuration. Less than 50% stenosis of the left subclavian artery at its origin. Extensive, irregular mixed atherosclerotic plaque at the right carotid bifurcation results in a greater than 90% stenosis of the proximal right internal carotid artery calcified atherosclerotic plaque at the left carotid bifurcation results in a less than 50% stenosis the proximal left internal carotid artery. Calcified atherosclerotic plaque results in a 50% stenosis of the dominant left vertebral artery at its origin. Mild cardiomegaly with left ventricular hypertrophy noted. Extensive atherosclerotic calcification of the aortic valve leaflets. The aortic valve is trileaflet. Moderate coronary artery calcification with probable stenting of the proximal right coronary artery. Moderate pericardial effusion is present, enlarged in size when compared to prior examination. Pericardial fluid appears slightly hyperdense suggesting proteinaceous or hemorrhagic fluid. No CT evidence of cardiac tamponade. The central pulmonary arteries are of normal caliber. Mediastinum/Nodes: No enlarged mediastinal, hilar, or axillary lymph nodes. Thyroid gland, trachea, and esophagus demonstrate no significant findings. Lungs/Pleura: Small bilateral pleural effusions are present with compressive atelectasis of the a lower lobes bilaterally, left greater than right. There is cylindrical bronchiectasis and volume loss noted within the lingula and basilar right middle lobe. Mild peribronchial nodularity within these regions may reflect the sequela of remote inflammation or a chronic atypical infectious process as can be seen with mycobacterial or fungal infection. No confluent  pulmonary infiltrate. No pneumothorax. No central obstructing lesion. Musculoskeletal: No acute bone abnormality. No lytic or blastic bone lesion. Review of the MIP images confirms the above findings. CTA ABDOMEN AND PELVIS FINDINGS VASCULAR Aorta: Normal caliber. No aneurysm or dissection. Moderate atherosclerotic calcification. No periaortic inflammatory change. Celiac: Less than 50% stenosis of the a celiac origin secondary to calcified atherosclerotic plaque. Distally widely patent. No aneurysm or dissection. SMA: Patent without evidence of aneurysm, dissection, vasculitis or significant stenosis. Renals: Single renal arteries bilaterally. Less than 50% stenosis of the origin of the right renal artery. Widely patent left renal artery. Both arteries demonstrate a a beaded appearance within there mid segments in keeping with changes of fibromuscular dysplasia. No aneurysm or dissection. IMA: 50-75% stenosis of the origin.  Distally widely patent. Inflow: Mild atherosclerotic calcification. No evidence of hemodynamically significant stenosis. No aneurysm or dissection. Common iliac  arteries measure 8-9 mm in diameter bilaterally. External iliac arteries measure 6 mm in diameter bilaterally. Common femoral arteries measure 7 mm in diameter bilaterally. Veins: No obvious venous abnormality within the limitations of this arterial phase study. Review of the MIP images confirms the above findings. NON-VASCULAR Hepatobiliary: Geographic region of hyperenhancement within segment 2 of the liver may relate to underlying arterial portal shunting with a resultant transient hepatic attenuation difference (THAD). Similar region is seen within segment 4B of the liver. There are at least 3 arterial enhancing rounded lesions within segment 4 and segment 8 of the liver measuring up to 8 mm (image # 137/4) which are nonspecific but may represent small flash filled hemangioma. The liver is otherwise unremarkable. Gallbladder  unremarkable. No intra or extrahepatic biliary ductal dilation. Pancreas: Unremarkable Spleen: Unremarkable Adrenals/Urinary Tract: The adrenal glands are unremarkable. The kidneys are normal in position. Moderate left renal asymmetric cortical atrophy. No enhancing intrarenal masses. No hydronephrosis. No intrarenal or ureteral calculi. The bladder is unremarkable. Stomach/Bowel: Stomach is within normal limits. Appendix appears normal. No evidence of bowel wall thickening, distention, or inflammatory changes. Lymphatic: No pathologic adenopathy within the abdomen and pelvis. Reproductive: Uterus and bilateral adnexa are unremarkable. Other: Surgical dressing noted within the right inguinal region. No abdominal wall hernia. Musculoskeletal: Degenerative changes are noted within the lumbar spine with grade 1 anterolisthesis of L4-5. No acute bone abnormality. No lytic or blastic bone lesion. Review of the MIP images confirms the above findings. IMPRESSION: No evidence of thoracoabdominal aortic aneurysm or dissection. Peripheral vascular disease with greater than 90% stenosis of the proximal right internal carotid artery. Plaque morphology appears irregular and may be better assessed with dedicated carotid artery Doppler sonography. 50% stenosis of the a dominant left vertebral artery at its origin. 50-75% stenosis of the inferior mesenteric artery at its origin, though this is of questionable clinical significance given patency of the celiac axis and superior mesenteric artery. 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 Cardiomegaly with left ventricular hypertrophy. Extensive calcification of the aortic valve leaflets. Enlarging, moderate pericardial effusion without definite CT evidence of cardiac tamponade. Note, however, small bilateral pleural effusions have developed since prior examination and can be seen the setting of progressive cardiogenic  failure. Pericardial fluid appears slightly hyperdense suggesting a proteinaceous or hemorrhagic component. Cylindrical bronchiectasis and volume loss within the lingula and basilar right middle lobe with associated peribronchial nodularity. This may reflect the sequela of remote inflammation or reflect changes of a chronic infectious process such as atypical mycobacterial or fungal pneumonia. Multiple subcentimeter arterially enhancing lesions within the liver possibly representing small flash filled hemangioma. These are not optimally characterized on this examination may be better assessed with dedicated MRI examination if clinically indicated. Aortic Atherosclerosis (ICD10-I70.0). Electronically Signed   By: Fidela Salisbury Mcconnell.D.   On: 07/24/2021 21:16    CT ANGIO ABDOMEN W &/OR WO CONTRAST   Result Date: 07/24/2021 CLINICAL DATA:  Thoracic aorta disease, post-op; Aneurysm, pelvis or lower extremity. Aneurysm, pelvis or lower extremity. Severe symptomatic aortic stenosis. EXAM: CT ANGIOGRAPHY CHEST, ABDOMEN AND PELVIS TECHNIQUE: Multidetector CT imaging through the chest, abdomen and pelvis was performed using the standard protocol during bolus administration of intravenous contrast. Multiplanar reconstructed images and MIPs were obtained and reviewed to evaluate the vascular anatomy. CONTRAST:  154m OMNIPAQUE IOHEXOL 350 MG/ML SOLN COMPARISON:  07/18/2021 FINDINGS: CTA CHEST FINDINGS Cardiovascular: The thoracic aorta is of normal caliber, measuring 3.6 x 3.7 cm in its  ascending segment, 2.9 x 2.6 cm within the arch just beyond the takeoff of the left subclavian artery, and 2.6 x 2.2 cm within its descending segment at the level of the left atrium. No intramural hematoma or dissection. Moderate calcified atherosclerotic plaque. Conventional arch vessel anatomic configuration. Less than 50% stenosis of the left subclavian artery at its origin. Extensive, irregular mixed atherosclerotic plaque at the right  carotid bifurcation results in a greater than 90% stenosis of the proximal right internal carotid artery calcified atherosclerotic plaque at the left carotid bifurcation results in a less than 50% stenosis the proximal left internal carotid artery. Calcified atherosclerotic plaque results in a 50% stenosis of the dominant left vertebral artery at its origin. Mild cardiomegaly with left ventricular hypertrophy noted. Extensive atherosclerotic calcification of the aortic valve leaflets. The aortic valve is trileaflet. Moderate coronary artery calcification with probable stenting of the proximal right coronary artery. Moderate pericardial effusion is present, enlarged in size when compared to prior examination. Pericardial fluid appears slightly hyperdense suggesting proteinaceous or hemorrhagic fluid. No CT evidence of cardiac tamponade. The central pulmonary arteries are of normal caliber. Mediastinum/Nodes: No enlarged mediastinal, hilar, or axillary lymph nodes. Thyroid gland, trachea, and esophagus demonstrate no significant findings. Lungs/Pleura: Small bilateral pleural effusions are present with compressive atelectasis of the a lower lobes bilaterally, left greater than right. There is cylindrical bronchiectasis and volume loss noted within the lingula and basilar right middle lobe. Mild peribronchial nodularity within these regions may reflect the sequela of remote inflammation or a chronic atypical infectious process as can be seen with mycobacterial or fungal infection. No confluent pulmonary infiltrate. No pneumothorax. No central obstructing lesion. Musculoskeletal: No acute bone abnormality. No lytic or blastic bone lesion. Review of the MIP images confirms the above findings. CTA ABDOMEN AND PELVIS FINDINGS VASCULAR Aorta: Normal caliber. No aneurysm or dissection. Moderate atherosclerotic calcification. No periaortic inflammatory change. Celiac: Less than 50% stenosis of the a celiac origin secondary to  calcified atherosclerotic plaque. Distally widely patent. No aneurysm or dissection. SMA: Patent without evidence of aneurysm, dissection, vasculitis or significant stenosis. Renals: Single renal arteries bilaterally. Less than 50% stenosis of the origin of the right renal artery. Widely patent left renal artery. Both arteries demonstrate a a beaded appearance within there mid segments in keeping with changes of fibromuscular dysplasia. No aneurysm or dissection. IMA: 50-75% stenosis of the origin.  Distally widely patent. Inflow: Mild atherosclerotic calcification. No evidence of hemodynamically significant stenosis. No aneurysm or dissection. Common iliac arteries measure 8-9 mm in diameter bilaterally. External iliac arteries measure 6 mm in diameter bilaterally. Common femoral arteries measure 7 mm in diameter bilaterally. Veins: No obvious venous abnormality within the limitations of this arterial phase study. Review of the MIP images confirms the above findings. NON-VASCULAR Hepatobiliary: Geographic region of hyperenhancement within segment 2 of the liver may relate to underlying arterial portal shunting with a resultant transient hepatic attenuation difference (THAD). Similar region is seen within segment 4B of the liver. There are at least 3 arterial enhancing rounded lesions within segment 4 and segment 8 of the liver measuring up to 8 mm (image # 137/4) which are nonspecific but may represent small flash filled hemangioma. The liver is otherwise unremarkable. Gallbladder unremarkable. No intra or extrahepatic biliary ductal dilation. Pancreas: Unremarkable Spleen: Unremarkable Adrenals/Urinary Tract: The adrenal glands are unremarkable. The kidneys are normal in position. Moderate left renal asymmetric cortical atrophy. No enhancing intrarenal masses. No hydronephrosis. No intrarenal or ureteral calculi. The bladder is  unremarkable. Stomach/Bowel: Stomach is within normal limits. Appendix appears normal.  No evidence of bowel wall thickening, distention, or inflammatory changes. Lymphatic: No pathologic adenopathy within the abdomen and pelvis. Reproductive: Uterus and bilateral adnexa are unremarkable. Other: Surgical dressing noted within the right inguinal region. No abdominal wall hernia. Musculoskeletal: Degenerative changes are noted within the lumbar spine with grade 1 anterolisthesis of L4-5. No acute bone abnormality. No lytic or blastic bone lesion. Review of the MIP images confirms the above findings. IMPRESSION: No evidence of thoracoabdominal aortic aneurysm or dissection. Peripheral vascular disease with greater than 90% stenosis of the proximal right internal carotid artery. Plaque morphology appears irregular and may be better assessed with dedicated carotid artery Doppler sonography. 50% stenosis of the a dominant left vertebral artery at its origin. 50-75% stenosis of the inferior mesenteric artery at its origin, though this is of questionable clinical significance given patency of the celiac axis and superior mesenteric artery. 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 Cardiomegaly with left ventricular hypertrophy. Extensive calcification of the aortic valve leaflets. Enlarging, moderate pericardial effusion without definite CT evidence of cardiac tamponade. Note, however, small bilateral pleural effusions have developed since prior examination and can be seen the setting of progressive cardiogenic failure. Pericardial fluid appears slightly hyperdense suggesting a proteinaceous or hemorrhagic component. Cylindrical bronchiectasis and volume loss within the lingula and basilar right middle lobe with associated peribronchial nodularity. This may reflect the sequela of remote inflammation or reflect changes of a chronic infectious process such as atypical mycobacterial or fungal pneumonia. Multiple subcentimeter arterially enhancing lesions  within the liver possibly representing small flash filled hemangioma. These are not optimally characterized on this examination may be better assessed with dedicated MRI examination if clinically indicated. Aortic Atherosclerosis (ICD10-I70.0). Electronically Signed   By: Fidela Salisbury Mcconnell.D.   On: 07/24/2021 21:16    CARDIAC CATHETERIZATION   Result Date: 07/23/2021   Prox RCA lesion is 50% stenosed.   Prox Cx to Mid Cx lesion is 25% stenosed.   Prox LAD to Mid LAD lesion is 25% stenosed.   Hemodynamic findings consistent with pulmonary hypertension. Stable, mild to moderate coronary artery disease.  No indication for PCI. Unable to cross aortic valve, consistent with worsening noted on echocardiogram.  Continue plans for TAVR w/u.    CT CORONARY MORPH W/CTA COR W/SCORE W/CA W/CM &/OR WO/CM   Result Date: 07/25/2021 CLINICAL DATA:  Aortic Stenosis EXAM: Cardiac TAVR CT TECHNIQUE: The patient was scanned on a Siemens Force 242 slice scanner. A 120 kV retrospective scan was triggered in the ascending thoracic aorta at 140 HU's. Gantry rotation speed was 250 msecs and collimation was .6 mm. No beta blockade or nitro were given. The 3D data set was reconstructed in 5% intervals of the R-R cycle. Systolic and diastolic phases were analyzed on a dedicated work station using MPR, MIP and VRT modes. The patient received 80 cc of contrast. FINDINGS: Aortic Valve: Tri leaflet calcified score 2093 Aorta: Bovine arch no aneurysm moderate calcific atherosclerosis Sino-tubular Junction: 25 nn Ascending Thoracic Aorta: 35 mm Aortic Arch: 27 mm Descending Thoracic Aorta: 27 mm Sinus of Valsalva Measurements: Non-coronary: 29.3 mm Right - coronary: 28.6 mm Left -   coronary: 28 mm Coronary Artery Height above Annulus: Left Main: 15 mm above annulus Right Coronary: 15.6 mm above annulus Virtual Basal Annulus Measurements: Average  diameter: 22.8 mm Perimeter: 74.9 mm Area: 407 mm2 AVA by planimetry 1.01 cm2 Coronary Arteries:  Sufficient  height above annulus for deployment Optimum Fluoroscopic Angle for Delivery: LAO 20 Caudal 19 degrees IMPRESSION: 1. Tri leaflet AV with calcium score 2093 and planimetered AVA 1.01 cm2 2.  Annular area of 407 mm2 suitable for a 23 mm Sapien 3 valve 3.  Coronary arteries sufficient height above annulus for deployment 4. Optimal angiographic angle for deployment LAO 20 Caudal 19 degrees 5.  Large circumferential pericardial effusion [place] Electronically Signed   By: Jenkins Rouge Mcconnell.D.   On: 07/25/2021 06:57     STS Risk Calculator: Procedure: AV Replacement  Risk of Mortality: 3.209% Renal Failure: 3.347% Permanent Stroke: 2.478% Prolonged Ventilation: 10.012% DSW Infection: 0.071% Reoperation: 3.514% Morbidity or Mortality:15.283% Short Length of Stay: 24.645% Long Length of Stay: 7.788%   [facility] Cardiomyopathy Questionnaire  KCCQ-12 07/23/2021  1 a. Ability to shower/bathe Slightly limited  1 b. Ability to walk 1 block Slightly limited  1 c. Ability to hurry/jog Other, Did not do  2. Edema feet/ankles/legs Less than once a week  3. Limited by fatigue 1-2 times a week  4. Limited by dyspnea At least once a day  5. Sitting up / on 3+ pillows Less than once a week  6. Limited enjoyment of life Moderately limited  7. Rest of life w/ symptoms Mostly dissatisfied  8 a. Participation in hobbies Limited quite a bit  8 b. Participation in chores Moderately limited  8 c. Visiting family/friends Slightly limited    Assessment and Plan:       The patient is an active 87 year old woman with a prior history of moderate aortic stenosis who presents with a 5 to 6-week history of progressive exertional shortness of breath and fatigue.  She says that in retrospect she has noted a more gradual onset of symptoms but they have progressed quickly more recently.  She saw her PCP on 07/21/2021 and was noted to be in mild respiratory distress and was sent to the emergency room.  Chest  x-ray showed vascular congestion and small bilateral pleural effusions.  She was markedly hypertensive in the emergency room.  A bedside echocardiogram performed by the emergency room physician showed a pericardial effusion.  She had a formal 2D echocardiogram showing a calcified aortic valve with a mean pressure gradient of 33 mmHg.  Her previous mean aortic valve gradient was 20 mmHg in March 2021.  Left ventricular ejection fraction was 60 to 65%.  There was moderate concentric LVH.  There was a moderate sized circumferential pericardial effusion with no evidence of tamponade.  Cardiac catheterization showed mild to moderate nonobstructive coronary disease.  She underwent a gated cardiac CTA which showed a calcified aortic valve with a planimetry aortic valve area of 1.0 cm.  There was a large circumferential pericardial effusion.   On my exam today she is short of breath walking from the bathroom back to her bed.  It took her about 15 minutes after she sat down to recover from that.  There is a 3/6 harsh systolic murmur along the right sternal border.  There is no diastolic murmur.  Lung exam reveals clear breath sounds bilaterally.  There is no peripheral edema.   Although her mean gradient is only 33 mmHg she has a severely calcified and thickened aortic valve with restricted leaflet mobility and given her NYHA class III symptoms I think this represents stage D severe symptomatic aortic stenosis.  I think the best treatment is aortic valve replacement.  Given her age I think transcatheter aortic valve  replacement would be the best option for her.  Her gated cardiac CTA shows anatomy suitable for TAVR using a SAPIEN 3 valve.  Her abdominal and pelvic CTA shows adequate pelvic vascular anatomy to allow transfemoral insertion.  I think she should have a pericardiocentesis to drain the large circumferential pericardial effusion preoperatively since this may be contributing to her symptoms and certainly could  affect her hemodynamics during the procedure especially if it is complicated.   The patient and her daughter ( by conference call) were counseled at length regarding treatment alternatives for management of severe symptomatic aortic stenosis. The risks and benefits of surgical intervention has been discussed in detail. Long-term prognosis with medical therapy was discussed. Alternative approaches such as conventional surgical aortic valve replacement, transcatheter aortic valve replacement, and palliative medical therapy were compared and contrasted at length. This discussion was placed in the context of the patient's own specific clinical presentation and past medical history. All of their questions have been addressed.    Following the decision to proceed with transcatheter aortic valve replacement, a discussion was held regarding what types of management strategies would be attempted intraoperatively in the event of life-threatening complications, including whether or not the patient would be considered a candidate for the use of cardiopulmonary bypass and/or conversion to open sternotomy for attempted surgical intervention. The patient is aware of the fact that transient use of cardiopulmonary bypass may be necessary. The patient has been advised of a variety of complications that might develop including but not limited to risks of death, stroke, paravalvular leak, aortic dissection or other major vascular complications, aortic annulus rupture, device embolization, cardiac rupture or perforation, mitral regurgitation, acute myocardial infarction, arrhythmia, heart block or bradycardia requiring permanent pacemaker placement, congestive heart failure, respiratory failure, renal failure, pneumonia, infection, other late complications related to structural valve deterioration or migration, or other complications that might ultimately cause a temporary or permanent loss of functional independence or other long  term morbidity. The patient provides full informed consent for the procedure as described and all questions were answered. Plan transfemoral TAVR.  Gaye Pollack, MD 07/27/2021

## 2021-08-02 NOTE — Progress Notes (Signed)
Spoke with Daughter Dynesha Woolen [phone]) for PAT information and instructions for DOS.  DUE TO COVID-19 ONLY ONE VISITOR IS ALLOWED TO COME WITH YOU AND STAY IN THE WAITING ROOM ONLY DURING PRE OP AND PROCEDURE DAY OF SURGERY.   Two VISITORS MAY VISIT WITH YOU AFTER SURGERY IN YOUR PRIVATE ROOM DURING VISITING HOURS ONLY!  PCP - Dr Dennis Bast Cardiologist - n/a  CT Chest x-ray - 07/24/21 EKG - 07/26/21 Stress Test - n/a ECHO Limited - 07/29/21 Cardiac Cath - 07/23/21  ICD Pacemaker/Loop - n/a  Sleep Study -  n/a CPAP - none  Aspirin Instructions: Follow your surgeon's instructions on ASA prior to surgery.  See note in Epic dated 07/29/21 by Dr Jens Som.   Anesthesia review: Yes  STOP now taking any Aspirin (unless otherwise instructed by your surgeon), Aleve, Naproxen, Ibuprofen, Motrin, Advil, Goody's, BC's, all herbal medications, fish oil, and all vitamins.   Coronavirus Screening Covid test is scheduled on DOS Do you have any of the following symptoms:  Cough yes/no: No Fever (>100.38F)  yes/no: No Runny nose yes/no: No Sore throat yes/no: No Difficulty breathing/shortness of breath  Yes  Have you traveled in the last 14 days and where? yes/no: No  Daughter Meredith Kilbride (cell [phone]) verbalized understanding of instructions that were given via phone.

## 2021-08-03 ENCOUNTER — Encounter (HOSPITAL_COMMUNITY)
Admission: RE | Disposition: A | Discharge status: Home Health | Payer: Self-pay | Source: Home / Self Care | Attending: Internal Medicine

## 2021-08-03 ENCOUNTER — Inpatient Hospital Stay (HOSPITAL_COMMUNITY): Payer: Medicare HMO

## 2021-08-03 ENCOUNTER — Inpatient Hospital Stay (HOSPITAL_COMMUNITY)
Admission: RE | Admit: 2021-08-03 | Discharge: 2021-08-04 | DRG: 267 | Disposition: A | Discharge status: Home Health | Payer: Medicare HMO | Attending: Internal Medicine | Admitting: Internal Medicine

## 2021-08-03 ENCOUNTER — Inpatient Hospital Stay (HOSPITAL_COMMUNITY): Payer: Medicare HMO | Admitting: Anesthesiology

## 2021-08-03 ENCOUNTER — Other Ambulatory Visit: Payer: Self-pay | Admitting: Physician Assistant

## 2021-08-03 ENCOUNTER — Encounter (HOSPITAL_COMMUNITY): Payer: Self-pay | Admitting: Internal Medicine

## 2021-08-03 DIAGNOSIS — I110 Hypertensive heart disease with heart failure: Secondary | ICD-10-CM | POA: Diagnosis present

## 2021-08-03 DIAGNOSIS — K7469 Other cirrhosis of liver: Secondary | ICD-10-CM | POA: Diagnosis present

## 2021-08-03 DIAGNOSIS — I480 Paroxysmal atrial fibrillation: Secondary | ICD-10-CM

## 2021-08-03 DIAGNOSIS — I5032 Chronic diastolic (congestive) heart failure: Secondary | ICD-10-CM | POA: Diagnosis present

## 2021-08-03 DIAGNOSIS — I779 Disorder of arteries and arterioles, unspecified: Secondary | ICD-10-CM | POA: Diagnosis present

## 2021-08-03 DIAGNOSIS — Z952 Presence of prosthetic heart valve: Secondary | ICD-10-CM

## 2021-08-03 DIAGNOSIS — R5381 Other malaise: Secondary | ICD-10-CM

## 2021-08-03 DIAGNOSIS — Z8249 Family history of ischemic heart disease and other diseases of the circulatory system: Secondary | ICD-10-CM | POA: Diagnosis not present

## 2021-08-03 DIAGNOSIS — Z885 Allergy status to narcotic agent status: Secondary | ICD-10-CM

## 2021-08-03 DIAGNOSIS — E785 Hyperlipidemia, unspecified: Secondary | ICD-10-CM | POA: Diagnosis present

## 2021-08-03 DIAGNOSIS — E871 Hypo-osmolality and hyponatremia: Secondary | ICD-10-CM | POA: Diagnosis present

## 2021-08-03 DIAGNOSIS — I3139 Other pericardial effusion (noninflammatory): Secondary | ICD-10-CM

## 2021-08-03 DIAGNOSIS — Z888 Allergy status to other drugs, medicaments and biological substances status: Secondary | ICD-10-CM

## 2021-08-03 DIAGNOSIS — I351 Nonrheumatic aortic (valve) insufficiency: Secondary | ICD-10-CM

## 2021-08-03 DIAGNOSIS — I350 Nonrheumatic aortic (valve) stenosis: Principal | ICD-10-CM

## 2021-08-03 DIAGNOSIS — I272 Pulmonary hypertension, unspecified: Secondary | ICD-10-CM | POA: Diagnosis present

## 2021-08-03 DIAGNOSIS — I2510 Atherosclerotic heart disease of native coronary artery without angina pectoris: Secondary | ICD-10-CM | POA: Diagnosis present

## 2021-08-03 DIAGNOSIS — Z91041 Radiographic dye allergy status: Secondary | ICD-10-CM | POA: Diagnosis not present

## 2021-08-03 DIAGNOSIS — I10 Essential (primary) hypertension: Secondary | ICD-10-CM | POA: Diagnosis present

## 2021-08-03 DIAGNOSIS — Z20822 Contact with and (suspected) exposure to covid-19: Secondary | ICD-10-CM | POA: Diagnosis present

## 2021-08-03 DIAGNOSIS — I5181 Takotsubo syndrome: Secondary | ICD-10-CM | POA: Diagnosis present

## 2021-08-03 DIAGNOSIS — Z006 Encounter for examination for normal comparison and control in clinical research program: Secondary | ICD-10-CM

## 2021-08-03 DIAGNOSIS — R0602 Shortness of breath: Secondary | ICD-10-CM

## 2021-08-03 HISTORY — DX: Paroxysmal atrial fibrillation: I48.0

## 2021-08-03 HISTORY — DX: Gastro-esophageal reflux disease without esophagitis: K21.9

## 2021-08-03 HISTORY — DX: Other seasonal allergic rhinitis: J30.2

## 2021-08-03 HISTORY — DX: Presence of prosthetic heart valve: Z95.2

## 2021-08-03 HISTORY — DX: Anxiety disorder, unspecified: F41.9

## 2021-08-03 HISTORY — DX: Dyspnea, unspecified: R06.00

## 2021-08-03 HISTORY — DX: Unspecified osteoarthritis, unspecified site: M19.90

## 2021-08-03 HISTORY — PX: INTRAOPERATIVE TRANSTHORACIC ECHOCARDIOGRAM: SHX6523

## 2021-08-03 HISTORY — PX: TRANSCATHETER AORTIC VALVE REPLACEMENT, TRANSFEMORAL: SHX6400

## 2021-08-03 HISTORY — DX: Unspecified hemorrhoids: K64.9

## 2021-08-03 LAB — POCT I-STAT, CHEM 8
BUN: 26 mg/dL — ABNORMAL HIGH (ref 8–23)
BUN: 27 mg/dL — ABNORMAL HIGH (ref 8–23)
BUN: 25 mg/dL — ABNORMAL HIGH (ref 8–23)
Calcium, Ion: 1.17 mmol/L (ref 1.15–1.40)
Calcium, Ion: 1.28 mmol/L (ref 1.15–1.40)
Calcium, Ion: 1.23 mmol/L (ref 1.15–1.40)
Chloride: 99 mmol/L (ref 98–111)
Chloride: 97 mmol/L — ABNORMAL LOW (ref 98–111)
Chloride: 99 mmol/L (ref 98–111)
Creatinine, Ser: 1 mg/dL (ref 0.44–1.00)
Creatinine, Ser: 1 mg/dL (ref 0.44–1.00)
Creatinine, Ser: 1 mg/dL (ref 0.44–1.00)
Glucose, Bld: 115 mg/dL — ABNORMAL HIGH (ref 70–99)
Glucose, Bld: 145 mg/dL — ABNORMAL HIGH (ref 70–99)
Glucose, Bld: 146 mg/dL — ABNORMAL HIGH (ref 70–99)
HCT: 34 % — ABNORMAL LOW (ref 36.0–46.0)
HCT: 31 % — ABNORMAL LOW (ref 36.0–46.0)
HCT: 30 % — ABNORMAL LOW (ref 36.0–46.0)
Hemoglobin: 11.6 g/dL — ABNORMAL LOW (ref 12.0–15.0)
Hemoglobin: 10.5 g/dL — ABNORMAL LOW (ref 12.0–15.0)
Hemoglobin: 10.2 g/dL — ABNORMAL LOW (ref 12.0–15.0)
Potassium: 4.1 mmol/L (ref 3.5–5.1)
Potassium: 4.2 mmol/L (ref 3.5–5.1)
Potassium: 4.3 mmol/L (ref 3.5–5.1)
Sodium: 132 mmol/L — ABNORMAL LOW (ref 135–145)
Sodium: 131 mmol/L — ABNORMAL LOW (ref 135–145)
Sodium: 131 mmol/L — ABNORMAL LOW (ref 135–145)
TCO2: 26 mmol/L (ref 22–32)
TCO2: 27 mmol/L (ref 22–32)
TCO2: 24 mmol/L (ref 22–32)

## 2021-08-03 LAB — CYTOLOGY - NON PAP

## 2021-08-03 LAB — POCT I-STAT 7, (LYTES, BLD GAS, ICA,H+H)
Acid-Base Excess: 1 mmol/L (ref 0.0–2.0)
Acid-base deficit: 1 mmol/L (ref 0.0–2.0)
Bicarbonate: 25.4 mmol/L (ref 20.0–28.0)
Bicarbonate: 26 mmol/L (ref 20.0–28.0)
Calcium, Ion: 1.27 mmol/L (ref 1.15–1.40)
Calcium, Ion: 1.26 mmol/L (ref 1.15–1.40)
HCT: 32 % — ABNORMAL LOW (ref 36.0–46.0)
HCT: 30 % — ABNORMAL LOW (ref 36.0–46.0)
Hemoglobin: 10.9 g/dL — ABNORMAL LOW (ref 12.0–15.0)
Hemoglobin: 10.2 g/dL — ABNORMAL LOW (ref 12.0–15.0)
O2 Saturation: 100 %
O2 Saturation: 100 %
Potassium: 4.1 mmol/L (ref 3.5–5.1)
Potassium: 4.3 mmol/L (ref 3.5–5.1)
Sodium: 131 mmol/L — ABNORMAL LOW (ref 135–145)
Sodium: 130 mmol/L — ABNORMAL LOW (ref 135–145)
TCO2: 27 mmol/L (ref 22–32)
TCO2: 28 mmol/L (ref 22–32)
pCO2 arterial: 39.8 mmHg (ref 32.0–48.0)
pCO2 arterial: 52.9 mmHg — ABNORMAL HIGH (ref 32.0–48.0)
pH, Arterial: 7.412 (ref 7.350–7.450)
pH, Arterial: 7.299 — ABNORMAL LOW (ref 7.350–7.450)
pO2, Arterial: 187 mmHg — ABNORMAL HIGH (ref 83.0–108.0)
pO2, Arterial: 221 mmHg — ABNORMAL HIGH (ref 83.0–108.0)

## 2021-08-03 LAB — SARS CORONAVIRUS 2 BY RT PCR (HOSPITAL ORDER, PERFORMED IN ~~LOC~~ HOSPITAL LAB): SARS Coronavirus 2: NEGATIVE

## 2021-08-03 LAB — ECHOCARDIOGRAM LIMITED
AR max vel: 3.47 cm2
AV Area VTI: 3.52 cm2
AV Area mean vel: 3.5 cm2
AV Mean grad: 6 mmHg
AV Peak grad: 10.1 mmHg
Ao pk vel: 1.59 m/s
Calc EF: 62.7 %
Single Plane A2C EF: 60 %
Single Plane A4C EF: 63.7 %

## 2021-08-03 LAB — TYPE AND SCREEN
ABO/RH(D): A POS
Antibody Screen: NEGATIVE

## 2021-08-03 SURGERY — IMPLANTATION, AORTIC VALVE, TRANSCATHETER, FEMORAL APPROACH
Anesthesia: Monitor Anesthesia Care

## 2021-08-03 MED ORDER — ACETAMINOPHEN 500 MG PO TABS
1000.0000 mg | ORAL_TABLET | Freq: Once | ORAL | Status: AC
  Administered 2021-08-03: 1000 mg via ORAL
  Filled 2021-08-03: qty 2

## 2021-08-03 MED ORDER — SODIUM CHLORIDE 0.9% FLUSH
3.0000 mL | Freq: Two times a day (BID) | INTRAVENOUS | Status: DC
  Administered 2021-08-03: 3 mL via INTRAVENOUS

## 2021-08-03 MED ORDER — HEPARIN (PORCINE) IN NACL 1000-0.9 UT/500ML-% IV SOLN
INTRAVENOUS | Status: DC | PRN
  Administered 2021-08-03 (×3): 500 mL

## 2021-08-03 MED ORDER — HEPARIN (PORCINE) IN NACL 1000-0.9 UT/500ML-% IV SOLN
INTRAVENOUS | Status: AC
  Filled 2021-08-03: qty 500

## 2021-08-03 MED ORDER — LORATADINE 10 MG PO TABS
10.0000 mg | ORAL_TABLET | Freq: Every day | ORAL | Status: DC
  Filled 2021-08-03 (×2): qty 1

## 2021-08-03 MED ORDER — LIDOCAINE HCL (PF) 1 % IJ SOLN
INTRAMUSCULAR | Status: DC | PRN
  Administered 2021-08-03 (×2): 15 mL

## 2021-08-03 MED ORDER — MONTELUKAST SODIUM 10 MG PO TABS
10.0000 mg | ORAL_TABLET | Freq: Every day | ORAL | Status: DC
  Administered 2021-08-03: 10 mg via ORAL
  Filled 2021-08-03: qty 1

## 2021-08-03 MED ORDER — IOHEXOL 350 MG/ML SOLN
INTRAVENOUS | Status: DC | PRN
  Administered 2021-08-03: 90 mL via INTRA_ARTERIAL

## 2021-08-03 MED ORDER — PANTOPRAZOLE SODIUM 40 MG PO TBEC
40.0000 mg | DELAYED_RELEASE_TABLET | Freq: Every day | ORAL | Status: DC
  Administered 2021-08-04: 40 mg via ORAL
  Filled 2021-08-03 (×2): qty 1

## 2021-08-03 MED ORDER — ACETAMINOPHEN 325 MG PO TABS
650.0000 mg | ORAL_TABLET | Freq: Four times a day (QID) | ORAL | Status: DC | PRN
  Administered 2021-08-03 – 2021-08-04 (×2): 650 mg via ORAL
  Filled 2021-08-03 (×2): qty 2

## 2021-08-03 MED ORDER — CHLORHEXIDINE GLUCONATE 4 % EX LIQD
30.0000 mL | CUTANEOUS | Status: DC
  Filled 2021-08-03: qty 30

## 2021-08-03 MED ORDER — ROSUVASTATIN CALCIUM 5 MG PO TABS
5.0000 mg | ORAL_TABLET | Freq: Every evening | ORAL | Status: DC
  Administered 2021-08-03: 5 mg via ORAL
  Filled 2021-08-03: qty 1

## 2021-08-03 MED ORDER — DOCUSATE SODIUM 100 MG PO CAPS
100.0000 mg | ORAL_CAPSULE | Freq: Every day | ORAL | Status: DC
  Administered 2021-08-04: 100 mg via ORAL
  Filled 2021-08-03: qty 1

## 2021-08-03 MED ORDER — ACETAMINOPHEN 650 MG RE SUPP: 650.0000 mg | Freq: Four times a day (QID) | RECTAL | Status: DC | PRN

## 2021-08-03 MED ORDER — DIPHENHYDRAMINE HCL 50 MG/ML IJ SOLN
50.0000 mg | INTRAMUSCULAR | Status: AC
  Administered 2021-08-03: 50 mg via INTRAVENOUS
  Filled 2021-08-03: qty 1

## 2021-08-03 MED ORDER — CHLORHEXIDINE GLUCONATE 0.12 % MT SOLN
15.0000 mL | Freq: Once | OROMUCOSAL | Status: AC
  Administered 2021-08-03: 15 mL via OROMUCOSAL
  Filled 2021-08-03: qty 15

## 2021-08-03 MED ORDER — DEXMEDETOMIDINE HCL IN NACL 400 MCG/100ML IV SOLN
INTRAVENOUS | Status: DC | PRN
  Administered 2021-08-03: 27.36 ug via INTRAVENOUS
  Administered 2021-08-03: 28.04 ug via INTRAVENOUS

## 2021-08-03 MED ORDER — SODIUM CHLORIDE 0.9 % IV SOLN: Freq: Once | INTRAVENOUS | Status: AC

## 2021-08-03 MED ORDER — SODIUM CHLORIDE 0.9 % IV SOLN: 250.0000 mL | INTRAVENOUS | Status: DC | PRN

## 2021-08-03 MED ORDER — CEFAZOLIN SODIUM-DEXTROSE 2-4 GM/100ML-% IV SOLN
2.0000 g | Freq: Three times a day (TID) | INTRAVENOUS | Status: AC
  Administered 2021-08-03 – 2021-08-04 (×2): 2 g via INTRAVENOUS
  Filled 2021-08-03 (×2): qty 100

## 2021-08-03 MED ORDER — SODIUM CHLORIDE 0.9 % IV SOLN: INTRAVENOUS | Status: AC

## 2021-08-03 MED ORDER — LACTATED RINGERS IV SOLN: INTRAVENOUS | Status: DC | PRN

## 2021-08-03 MED ORDER — HEPARIN SODIUM (PORCINE) 1000 UNIT/ML IJ SOLN
INTRAMUSCULAR | Status: DC | PRN
Start: 2021-08-03 — End: 2021-08-03
  Administered 2021-08-03: 9000 [IU] via INTRAVENOUS

## 2021-08-03 MED ORDER — NITROGLYCERIN IN D5W 200-5 MCG/ML-% IV SOLN: 0.0000 ug/min | INTRAVENOUS | Status: DC

## 2021-08-03 MED ORDER — TRAMADOL HCL 50 MG PO TABS: 50.0000 mg | ORAL_TABLET | ORAL | Status: DC | PRN

## 2021-08-03 MED ORDER — LACTATED RINGERS IV SOLN: INTRAVENOUS | Status: DC

## 2021-08-03 MED ORDER — ONDANSETRON HCL 4 MG/2ML IJ SOLN: 4.0000 mg | Freq: Four times a day (QID) | INTRAMUSCULAR | Status: DC | PRN

## 2021-08-03 MED ORDER — MORPHINE SULFATE (PF) 2 MG/ML IV SOLN: 1.0000 mg | INTRAVENOUS | Status: DC | PRN

## 2021-08-03 MED ORDER — SODIUM CHLORIDE 0.9 % IV SOLN: INTRAVENOUS | Status: DC | PRN

## 2021-08-03 MED ORDER — ASPIRIN 81 MG PO CHEW
81.0000 mg | CHEWABLE_TABLET | Freq: Every day | ORAL | Status: DC
  Administered 2021-08-04: 81 mg via ORAL
  Filled 2021-08-03: qty 1

## 2021-08-03 MED ORDER — ONDANSETRON HCL 4 MG/2ML IJ SOLN
INTRAMUSCULAR | Status: DC | PRN
  Administered 2021-08-03: 4 mg via INTRAVENOUS

## 2021-08-03 MED ORDER — LIDOCAINE HCL (PF) 1 % IJ SOLN
INTRAMUSCULAR | Status: AC
  Filled 2021-08-03: qty 30

## 2021-08-03 MED ORDER — PHENYLEPHRINE 40 MCG/ML (10ML) SYRINGE FOR IV PUSH (FOR BLOOD PRESSURE SUPPORT)
PREFILLED_SYRINGE | INTRAVENOUS | Status: DC | PRN
  Administered 2021-08-03: 40 ug via INTRAVENOUS
  Administered 2021-08-03 (×2): 80 ug via INTRAVENOUS
  Administered 2021-08-03: 40 ug via INTRAVENOUS
  Administered 2021-08-03: 80 ug via INTRAVENOUS

## 2021-08-03 MED ORDER — PROPOFOL 10 MG/ML IV BOLUS
INTRAVENOUS | Status: DC | PRN
  Administered 2021-08-03: 30 mg via INTRAVENOUS

## 2021-08-03 MED ORDER — OXYCODONE HCL 5 MG PO TABS: 5.0000 mg | ORAL_TABLET | ORAL | Status: DC | PRN

## 2021-08-03 MED ORDER — CHLORHEXIDINE GLUCONATE 4 % EX LIQD: 60.0000 mL | Freq: Once | CUTANEOUS | Status: DC

## 2021-08-03 MED ORDER — PROTAMINE SULFATE 10 MG/ML IV SOLN
INTRAVENOUS | Status: DC | PRN
  Administered 2021-08-03: 90 mg via INTRAVENOUS

## 2021-08-03 MED ORDER — SODIUM CHLORIDE 0.9 % IV SOLN: INTRAVENOUS | Status: DC

## 2021-08-03 MED ORDER — HEPARIN (PORCINE) IN NACL 1000-0.9 UT/500ML-% IV SOLN
INTRAVENOUS | Status: AC
  Filled 2021-08-03: qty 1000

## 2021-08-03 MED ORDER — PROPOFOL 500 MG/50ML IV EMUL
INTRAVENOUS | Status: DC | PRN
  Administered 2021-08-03: 20 ug/kg/min via INTRAVENOUS

## 2021-08-03 MED ORDER — SODIUM CHLORIDE 0.9% FLUSH: 3.0000 mL | INTRAVENOUS | Status: DC | PRN

## 2021-08-03 MED ORDER — METHYLPREDNISOLONE SODIUM SUCC 125 MG IJ SOLR
60.0000 mg | INTRAMUSCULAR | Status: AC
  Administered 2021-08-03: 60 mg via INTRAVENOUS
  Filled 2021-08-03: qty 2

## 2021-08-03 SURGICAL SUPPLY — 27 items
BAG SNAP BAND KOVER 36X36 (MISCELLANEOUS) ×8 IMPLANT
CATH 23 ULTRA DELIVERY (CATHETERS) ×2 IMPLANT
CATH DIAG 6FR PIGTAIL ANGLED (CATHETERS) ×4 IMPLANT
CATH INFINITI 6F AL1 (CATHETERS) ×2 IMPLANT
CLOSURE MYNX CONTROL 6F/7F (Vascular Products) ×2 IMPLANT
CLOSURE PERCLOSE PROSTYLE (VASCULAR PRODUCTS) ×4 IMPLANT
CRIMPER (MISCELLANEOUS) ×2 IMPLANT
DEVICE INFLATION ATRION QL2530 (MISCELLANEOUS) ×2 IMPLANT
ELECT DEFIB PAD ADLT CADENCE (PAD) ×2 IMPLANT
KIT HEART LEFT (KITS) ×3 IMPLANT
KIT SAPIAN 3 ULTRA RESILIA 23 (Valve) ×2 IMPLANT
PACK CARDIAC CATHETERIZATION (CUSTOM PROCEDURE TRAY) ×3 IMPLANT
SHEATH INTRODUCER SET 20-26 (SHEATH) ×2 IMPLANT
SHEATH PINNACLE 6F 10CM (SHEATH) ×4 IMPLANT
SHEATH PINNACLE 7F 10CM (SHEATH) ×2 IMPLANT
SHEATH PINNACLE 8F 10CM (SHEATH) ×2 IMPLANT
STOPCOCK MORSE 400PSI 3WAY (MISCELLANEOUS) ×8 IMPLANT
SYR MEDRAD MARK V 150ML (SYRINGE) ×4 IMPLANT
TRANSDUCER W/STOPCOCK (MISCELLANEOUS) ×6 IMPLANT
TUBING CIL FLEX 10 FLL-RA (TUBING) ×4 IMPLANT
TUBING CONTRAST HIGH PRESS 48 (TUBING) ×2 IMPLANT
WIRE AMPLATZ SS-J .035X180CM (WIRE) ×2 IMPLANT
WIRE EMERALD 3MM-J .035X150CM (WIRE) ×4 IMPLANT
WIRE EMERALD 3MM-J .035X260CM (WIRE) ×2 IMPLANT
WIRE EMERALD ST .035X260CM (WIRE) ×2 IMPLANT
WIRE MICRO SET SILHO 5FR 7 (SHEATH) ×4 IMPLANT
WIRE SAFARI SM CURVE 275 (WIRE) ×2 IMPLANT

## 2021-08-03 NOTE — Progress Notes (Signed)
SITE AREA: right radial (a-line)  SITE PRIOR TO REMOVAL:  LEVEL 0   PRESSURE APPLIED FOR: approximately 10 minutes  MANUAL: yes  PATIENT STATUS DURING PULL: stable  POST PULL SITE:  LEVEL 0  POST PULL INSTRUCTIONS GIVEN: yes  POST PULL PULSES PRESENT: radial pulses at +2 bilaterally  DRESSING APPLIED: gauze with tegaderm  Comments: pt placed on bedpan with towel, purewick refused by pt stating it doesn't work

## 2021-08-03 NOTE — Anesthesia Preprocedure Evaluation (Addendum)
Anesthesia Evaluation  Patient identified by MRN, date of birth, ID band Patient awake    Reviewed: Allergy & Precautions, NPO status , Patient's Chart, lab work & pertinent test results, reviewed documented beta blocker date and time   Airway Mallampati: II  TM Distance: >3 FB Neck ROM: Full    Dental  (+) Dental Advisory Given, Lower Dentures, Partial Upper   Pulmonary COPD,  COPD inhaler, former smoker,    Pulmonary exam normal breath sounds clear to auscultation       Cardiovascular hypertension, Pt. on medications and Pt. on home beta blockers (-) angina+ CAD and + Past MI  + Valvular Problems/Murmurs AS  Rhythm:Regular Rate:Normal + Systolic murmurs Echo 7/0/78: 1. Left ventricular ejection fraction, by estimation, is 60 to 65%. The left ventricle has normal function.  2. Right ventricular systolic function is normal. The right ventricular size is normal. There is normal pulmonary artery systolic pressure.  3. Pericardial effusion, overall small Largest along inferior surface of RV/RA,particularly at sulcus. Compared to echo from 07/28/21, no significant change No evidence of hemodynamic compromise by echo criteria.  4. Mild mitral valve regurgitation.  5. AV is thickened, calcified with restricted motion. Peak and mean gradients through the valve are 37 and 22 mm Hg respectively.. The aortic valve is tricuspid. Aortic valve regurgitation is mild.  6. The inferior vena cava is normal in size with greater than 50% respiratory variability, suggesting right atrial pressure of 3 mmHg.    Neuro/Psych PSYCHIATRIC DISORDERS Anxiety negative neurological ROS     GI/Hepatic Neg liver ROS, GERD  Medicated,  Endo/Other  negative endocrine ROS  Renal/GU Renal InsufficiencyRenal disease     Musculoskeletal  (+) Arthritis ,   Abdominal   Peds  Hematology  (+) Blood dyscrasia, anemia ,   Anesthesia Other Findings Day of  surgery medications reviewed with the patient.  Reproductive/Obstetrics                            Anesthesia Physical Anesthesia Plan  ASA: 4  Anesthesia Plan: MAC   Post-op Pain Management: Tylenol PO (pre-op)   Induction: Intravenous  PONV Risk Score and Plan: 2 and Dexamethasone, Ondansetron, Propofol infusion and Treatment may vary due to age or medical condition  Airway Management Planned: Natural Airway and Simple Face Mask  Additional Equipment: Arterial line  Intra-op Plan:   Post-operative Plan:   Informed Consent: I have reviewed the patients History and Physical, chart, labs and discussed the procedure including the risks, benefits and alternatives for the proposed anesthesia with the patient or authorized representative who has indicated his/her understanding and acceptance.     Dental advisory given  Plan Discussed with: CRNA  Anesthesia Plan Comments: (2 PIV)        Anesthesia Quick Evaluation

## 2021-08-03 NOTE — Transfer of Care (Signed)
Immediate Anesthesia Transfer of Care Note  Patient: Barbara Mcconnell  Procedure(s) Performed: TRANSCATHETER AORTIC VALVE REPLACEMENT, TRANSFEMORAL INTRAOPERATIVE TRANSTHORACIC ECHOCARDIOGRAM  Patient Location: Cath Lab  Anesthesia Type:MAC  Level of Consciousness: drowsy  Airway & Oxygen Therapy: Patient Spontanous Breathing and Patient connected to face mask oxygen  Post-op Assessment: Report given to RN and Post -op Vital signs reviewed and stable  Post vital signs: Reviewed and stable  Last Vitals:  Vitals Value Taken Time  BP 103/48 08/03/21 1534  Temp    Pulse 67 08/03/21 1535  Resp 25 08/03/21 1535  SpO2 90 % 08/03/21 1535  Vitals shown include unvalidated device data.  Last Pain:  Vitals:   08/03/21 1011  TempSrc:   PainSc: 0-No pain      Patients Stated Pain Goal: 0 (17/00/17 4944)  Complications: No notable events documented.

## 2021-08-03 NOTE — Anesthesia Procedure Notes (Signed)
Procedure Name: MAC Date/Time: 08/03/2021 1:40 PM Performed by: Harden Mo, CRNA Pre-anesthesia Checklist: Patient identified, Emergency Drugs available, Suction available and Patient being monitored Patient Re-evaluated:Patient Re-evaluated prior to induction Oxygen Delivery Method: Simple face mask Preoxygenation: Pre-oxygenation with 100% oxygen Induction Type: IV induction Placement Confirmation: positive ETCO2 and breath sounds checked- equal and bilateral Dental Injury: Teeth and Oropharynx as per pre-operative assessment

## 2021-08-03 NOTE — Anesthesia Postprocedure Evaluation (Signed)
Anesthesia Post Note  Patient: Barbara Mcconnell  Procedure(s) Performed: TRANSCATHETER AORTIC VALVE REPLACEMENT, TRANSFEMORAL INTRAOPERATIVE TRANSTHORACIC ECHOCARDIOGRAM     Patient location during evaluation: PACU Anesthesia Type: MAC Level of consciousness: awake and alert Pain management: pain level controlled Vital Signs Assessment: post-procedure vital signs reviewed and stable Respiratory status: spontaneous breathing, nonlabored ventilation, respiratory function stable and patient connected to nasal cannula oxygen Cardiovascular status: stable and blood pressure returned to baseline Postop Assessment: no apparent nausea or vomiting Anesthetic complications: no   No notable events documented.  Last Vitals:  Vitals:   08/03/21 1645 08/03/21 1650  BP: (!) 102/42 (!) 137/57  Pulse: 72 66  Resp: (!) 24 19  Temp:    SpO2: 90% 100%    Last Pain:  Vitals:   08/03/21 1600  TempSrc: Temporal  PainSc:                  March Rummage Jeanene Mena

## 2021-08-03 NOTE — Anesthesia Procedure Notes (Signed)
Arterial Line Insertion Start/End1/04/2022 12:30 PM, 08/03/2021 12:45 PM Performed by: Jodell Cipro, CRNA, CRNA  Patient location: Pre-op. Preanesthetic checklist: patient identified, IV checked, site marked, risks and benefits discussed, surgical consent, monitors and equipment checked, pre-op evaluation, timeout performed and anesthesia consent Right, radial was placed Catheter size: 20 G Hand hygiene performed  and maximum sterile barriers used  Allen's test indicative of satisfactory collateral circulation Attempts: 1 Procedure performed without using ultrasound guided technique. Following insertion, dressing applied and Biopatch. Post procedure assessment: normal  Patient tolerated the procedure well with no immediate complications.

## 2021-08-03 NOTE — Progress Notes (Signed)
°  [place] VALVE TEAM  Patient doing well s/p TAVR. BP somewhat soft with arterial line pressures in the 90's. She is asymptomatic. Received 530ml IVF bolus. Groin sites stable. ECG with ST with frequent PACs however no high grade block. Plan to DC arterial line and transfer to 4E. Plan for early ambulation after bedrest completed and hopeful discharge over the next 24-48 hours.   Kathyrn Drown NP-C Structural Heart Team  Pager: [phone] Phone: [phone]

## 2021-08-03 NOTE — Op Note (Signed)
[facility]  TAVR OPERATIVE NOTE   Date of Procedure:  08/03/2021  Preoperative Diagnosis: Severe Aortic Stenosis   Postoperative Diagnosis: Same   Procedure:   Transcatheter Aortic Valve Replacement - Transfemoral Approach  Edwards Sapien 3 Resilia THV (size 2m, model # 9755RLS, serial # # [id number] serial # [id number]   Co-Surgeons:   BGaye Pollack MD and ALenna Sciara MD Anesthesiologist:  S. TGifford Shave MD  Echocardiographer:  P. NJohnsie Cancel MD  Pre-operative Echo Findings: Severe aortic stenosis Normal left ventricular systolic function  Post-operative Echo Findings: No paravalvular leak Normal left ventricular systolic function  BRIEF CLINICAL NOTE AND INDICATIONS FOR SURGERY  The patient is a 87year old female with a history of moderate to severe aortic stenosis, mild coronary artery disease, and transient paroxysmal atrial fibrillation who was admitted recently with acute on chronic diastolic heart failure.  After multidisciplinary review she was thought to be best served by transcatheter attic valve replacement for treatment of her severe symptomatic aortic stenosis.  During the course of the patient's preoperative work up they have been evaluated comprehensively by a multidisciplinary team of specialists [facility].  They have been demonstrated to suffer from symptomatic severe aortic stenosis as noted above. The patient has been counseled extensively as to the relative risks and benefits of all options for the treatment of severe aortic stenosis including long term medical therapy, conventional surgery for aortic valve replacement, and transcatheter aortic valve replacement.  The patient has been independently evaluated by Dr. BCyndia Bentwith CT surgery and they are felt to be at high risk for conventional surgical aortic valve replacement. The surgeon indicated the patient  would be a poor candidate for conventional surgery. Based upon review of all of the patient's preoperative diagnostic tests they are felt to be candidate for transcatheter aortic valve replacement using the transfemoral approach as an alternative to high risk conventional surgery.    Following the decision to proceed with transcatheter aortic valve replacement, a discussion has been held regarding what types of management strategies would be attempted intraoperatively in the event of life-threatening complications, including whether or not the patient would be considered a candidate for the use of cardiopulmonary bypass and/or conversion to open sternotomy for attempted surgical intervention.  The patient has been advised of a variety of complications that might develop peculiar to this approach including but not limited to risks of death, stroke, paravalvular leak, aortic dissection or other major vascular complications, aortic annulus rupture, device embolization, cardiac rupture or perforation, acute myocardial infarction, arrhythmia, heart block or bradycardia requiring permanent pacemaker placement, congestive heart failure, respiratory failure, renal failure, pneumonia, infection, other late complications related to structural valve deterioration or migration, or other complications that might ultimately cause a temporary or permanent loss of functional independence or other long term morbidity.  The patient provides full informed consent for the procedure as described and all questions were answered preoperatively.    DETAILS OF THE OPERATIVE PROCEDURE  PREPARATION:   The patient is brought to the operating room on the above mentioned date and central monitoring was established by the anesthesia team including placement of a radial arterial line. The patient is placed in the supine position on the operating table.  Intravenous antibiotics are administered. Conscious sedation is used.   Baseline  transthoracic echocardiogram was performed. The patient's chest, abdomen, both groins, and both lower extremities are prepared and draped in a sterile manner.  A time out procedure is performed.   PERIPHERAL ACCESS:   Using the modified Seldinger technique, femoral arterial and venous access were obtained with placement of a 6 Fr sheath in the artery and a 7 Fr sheath in the vein on the right side using u/s guidance.  A pigtail diagnostic catheter was passed through the femoral arterial sheath under fluoroscopic guidance into the aortic root.  A temporary transvenous pacemaker catheter was passed through the femoral venous sheath under fluoroscopic guidance into the right ventricle.  The pacemaker was tested to ensure stable lead placement and pacemaker capture. Aortic root angiography was performed in order to determine the optimal angiographic angle for valve deployment.  TRANSFEMORAL ACCESS:  A micropuncture kit was used to gain access to the right femoral artery using u/s guidance. Position confirmed with angiography. Pre-closure with double ProGlide closure devices. The patient was heparinized systemically and ACT verified > 250 seconds.    A 14 Fr transfemoral E-sheath was introduced into the right femoral artery after progressively dilating over an Amplatz superstiff wire. An AL-1 catheter was used to direct a straight-tip exchange length wire across the native aortic valve into the left ventricle. This was exchanged out for a pigtail catheter and position was confirmed in the LV apex. Simultaneous LV and Ao pressures were recorded.  The pigtail catheter was then exchanged for [facility] wire in the LV apex.   TRANSCATHETER HEART VALVE DEPLOYMENT:  An Edwards Sapien 3 THV (size 23 mm) was prepared and crimped per manufacturer's guidelines, and the proper orientation of the valve is confirmed on the [employer] delivery system. The valve was advanced through the introducer sheath using normal  technique until in an appropriate position in the abdominal aorta beyond the sheath tip. The balloon was then retracted and using the fine-tuning wheel was centered on the valve. The valve was then advanced across the aortic arch using appropriate flexion of the catheter. The valve was carefully positioned across the aortic valve annulus. The Commander catheter was retracted using normal technique. Once final position of the valve has been confirmed by angiographic assessment, the valve is deployed while temporarily holding ventilation and during rapid direct left ventricular pacing to maintain systolic blood pressure < 50 mmHg and pulse pressure < 10 mmHg. The balloon inflation is held for >3 seconds after reaching full deployment volume. Once the balloon has fully deflated the balloon is retracted into the ascending aorta and valve function is assessed using TTE. There is felt to be no paravalvular leak and no central aortic insufficiency.  The patient's hemodynamic recovery following valve deployment is good.  The deployment balloon and guidewire are both removed. Echo demostrated acceptable post-procedural gradients, stable mitral valve function, and no AI.   PROCEDURE COMPLETION:  The sheath was then removed and closure devices were completed. Protamine was administered once femoral arterial repair was complete. The temporary pacemaker, pigtail catheters and femoral sheaths were removed with a Mynx closure device placed in the artery and manual pressure used for venous hemostasis.    The patient tolerated the procedure well and is transported to the surgical intensive care in stable condition. There were no immediate intraoperative complications. All sponge instrument and needle counts are verified correct at completion of the operation.   No blood products were administered during the operation.  The patient received a total of 90 mL of intravenous contrast during the procedure.  Early Osmond  MD 08/03/2021 4:41 PM

## 2021-08-03 NOTE — Progress Notes (Signed)
Pt arrived to rm 17 from cath lab. Initiated tele. CHG wipe given. Oriented pt to the unit. VSS. Call bell within reach.   Lavenia Atlas, RN

## 2021-08-03 NOTE — Interval H&P Note (Signed)
History and Physical Interval Note:  08/03/2021 11:51 AM  Barbara Mcconnell  has presented today for surgery, with the diagnosis of Severe Aortic Stenosis.  The various methods of treatment have been discussed with the patient and family. After consideration of risks, benefits and other options for treatment, the patient has consented to  Procedure(s): TRANSCATHETER AORTIC VALVE REPLACEMENT, TRANSFEMORAL (N/A) INTRAOPERATIVE TRANSTHORACIC ECHOCARDIOGRAM (N/A) as a surgical intervention.  The patient's history has been reviewed, patient examined, no change in status, stable for surgery.  I have reviewed the patient's chart and labs.  Questions were answered to the patient's satisfaction.     Alleen Borne

## 2021-08-03 NOTE — Discharge Instructions (Signed)

## 2021-08-03 NOTE — Op Note (Signed)
[facility]   MULTIDISCIPLINARY HEART VALVE TEAM   TAVR OPERATIVE NOTE   Date of Procedure:  08/03/2021  Preoperative Diagnosis: Severe Aortic Stenosis   Postoperative Diagnosis: Same   Procedure:   Transcatheter Aortic Valve Replacement - Percutaneous Right Transfemoral Approach  Edwards Sapien 3 Ultra Resilia THV (size 23 mm, model # 9755RSL, serial # [id number])   Co-Surgeons:  Gaye Pollack, MD and Lenna Sciara, MD   Anesthesiologist:  S. Gifford Shave, MD  Echocardiographer:  Jerilynn Mages. Croitoru, MD  Pre-operative Echo Findings: Severe aortic stenosis Normal left ventricular systolic function  Post-operative Echo Findings: no paravalvular leak Normal left ventricular systolic function   BRIEF CLINICAL NOTE AND INDICATIONS FOR SURGERY  The patient is an active 87 year old woman with a prior history of moderate aortic stenosis who presents with a 5 to 6-week history of progressive exertional shortness of breath and fatigue.  She says that in retrospect she has noted a more gradual onset of symptoms but they have progressed quickly more recently.  She saw her PCP on 07/21/2021 and was noted to be in mild respiratory distress and was sent to the emergency room.  Chest x-ray showed vascular congestion and small bilateral pleural effusions.  She was markedly hypertensive in the emergency room.  A bedside echocardiogram performed by the emergency room physician showed a pericardial effusion.  She had a formal 2D echocardiogram showing a calcified aortic valve with a mean pressure gradient of 33 mmHg.  Her previous mean aortic valve gradient was 20 mmHg in March 2021.  Left ventricular ejection fraction was 60 to 65%.  There was moderate concentric LVH.  There was a moderate sized circumferential pericardial effusion with no evidence of tamponade.  Cardiac catheterization showed mild to moderate nonobstructive coronary disease.  She underwent a gated cardiac CTA which showed a  calcified aortic valve with a planimetry aortic valve area of 1.0 cm.  There was a large circumferential pericardial effusion.    Although her mean gradient is only 33 mmHg she has a severely calcified and thickened aortic valve with restricted leaflet mobility and given her NYHA class III symptoms I think this represents stage D severe symptomatic aortic stenosis.  I think the best treatment is aortic valve replacement.  Given her age I think transcatheter aortic valve replacement would be the best option for her.  Her gated cardiac CTA shows anatomy suitable for TAVR using a SAPIEN 3 valve.  Her abdominal and pelvic CTA shows adequate pelvic vascular anatomy to allow transfemoral insertion.    The patient and her daughter ( by conference call) were counseled at length regarding treatment alternatives for management of severe symptomatic aortic stenosis. The risks and benefits of surgical intervention has been discussed in detail. Long-term prognosis with medical therapy was discussed. Alternative approaches such as conventional surgical aortic valve replacement, transcatheter aortic valve replacement, and palliative medical therapy were compared and contrasted at length. This discussion was placed in the context of the patient's own specific clinical presentation and past medical history. All of their questions have been addressed.    Following the decision to proceed with transcatheter aortic valve replacement, a discussion was held regarding what types of management strategies would be attempted intraoperatively in the event of life-threatening complications, including whether or not the patient would be considered a candidate for the use of cardiopulmonary bypass and/or conversion to open sternotomy for attempted surgical intervention. The patient is aware of the fact that transient use of cardiopulmonary bypass may be  necessary. The patient has been advised of a variety of complications that might  develop including but not limited to risks of death, stroke, paravalvular leak, aortic dissection or other major vascular complications, aortic annulus rupture, device embolization, cardiac rupture or perforation, mitral regurgitation, acute myocardial infarction, arrhythmia, heart block or bradycardia requiring permanent pacemaker placement, congestive heart failure, respiratory failure, renal failure, pneumonia, infection, other late complications related to structural valve deterioration or migration, or other complications that might ultimately cause a temporary or permanent loss of functional independence or other long term morbidity. The patient provides full informed consent for the procedure as described and all questions were answered. Plan transfemoral TAVR.    DETAILS OF THE OPERATIVE PROCEDURE  PREPARATION:    The patient was brought to the operating room on the above mentioned date and appropriate monitoring was established by the anesthesia team. The patient was placed in the supine position on the operating table.  Intravenous antibiotics were administered. The patient was monitored closely throughout the procedure under conscious sedation.    Baseline transthoracic echocardiogram was performed. The patient's abdomen and both groins were prepped and draped in a sterile manner. A time out procedure was performed.   PERIPHERAL ACCESS:    Using the modified Seldinger technique, femoral arterial access was obtained with placement of a 6 Fr sheath on the left side.  Femoral venous access was obtained with placement of a 6 Fr sheath on the right side. A pigtail diagnostic catheter was passed through the left arterial sheath under fluoroscopic guidance into the aortic root.  Aortic root angiography was performed in order to determine the optimal angiographic angle for valve deployment.   TRANSFEMORAL ACCESS:   Percutaneous transfemoral access and sheath placement was performed using  ultrasound guidance.  The right common femoral artery was cannulated using a micropuncture needle and appropriate location was verified using hand injection angiogram.  A pair of Abbott Perclose percutaneous closure devices were placed and a 6 French sheath replaced into the femoral artery.  The patient was heparinized systemically and ACT verified > 250 seconds.    A 14 Fr transfemoral E-sheath was introduced into the right common femoral artery after progressively dilating over an Amplatz superstiff wire. An AL-1 catheter was used to direct a straight-tip exchange length wire across the native aortic valve into the left ventricle. This was exchanged out for a pigtail catheter and position was confirmed in the LV apex. Simultaneous LV and Ao pressures were recorded.  The pigtail catheter was exchanged for a [employer] wire in the LV apex.    BALLOON AORTIC VALVULOPLASTY:   Not performed   TRANSCATHETER HEART VALVE DEPLOYMENT:   An Edwards Sapien 3 Ultra transcatheter heart valve (size 23 mm) was prepared and crimped per manufacturer's guidelines, and the proper orientation of the valve is confirmed on the [employer] delivery system. The valve was advanced through the introducer sheath using normal technique until in an appropriate position in the abdominal aorta beyond the sheath tip. The balloon was then retracted and using the fine-tuning wheel was centered on the valve. The valve was then advanced across the aortic arch using appropriate flexion of the catheter. The valve was carefully positioned across the aortic valve annulus. The Commander catheter was retracted using normal technique. Once final position of the valve has been confirmed by angiographic assessment, the valve is deployed while temporarily holding ventilation and during rapid ventricular pacing through the [facility] wire in the LV to maintain systolic blood pressure <  50 mmHg and pulse pressure < 10 mmHg. The balloon inflation is held  for >3 seconds after reaching full deployment volume. Once the balloon has fully deflated the balloon is retracted into the ascending aorta and valve function is assessed using echocardiography. There is felt to be no paravalvular leak and no central aortic insufficiency.  The patient's hemodynamic recovery following valve deployment is good.  The deployment balloon and guidewire are both removed.    PROCEDURE COMPLETION:   The sheath was removed and femoral artery closure performed.  Protamine was administered once femoral arterial repair was complete. The temporary pacemaker, pigtail catheters and femoral sheaths were removed with manual pressure used for hemostasis.  A Mynx femoral closure device was utilized following removal of the diagnostic sheath in the left femoral artery.  The patient tolerated the procedure well and is transported to the cath lab recovery area in stable condition. There were no immediate intraoperative complications. All sponge instrument and needle counts are verified correct at completion of the operation.   No blood products were administered during the operation.  The patient received a total of 90 mL of intravenous contrast during the procedure.   Gaye Pollack, MD 08/03/2021

## 2021-08-03 NOTE — Progress Notes (Signed)
°  Echocardiogram 2D Echocardiogram has been performed.  Barbara Mcconnell 08/03/2021, 3:13 PM

## 2021-08-04 ENCOUNTER — Inpatient Hospital Stay (HOSPITAL_COMMUNITY): Payer: Medicare HMO

## 2021-08-04 ENCOUNTER — Inpatient Hospital Stay (HOSPITAL_COMMUNITY)
Admission: RE | Admit: 2021-08-04 | Discharge: 2021-08-04 | Disposition: A | Discharge status: Home / Self Care | Payer: Medicare HMO | Source: Home / Self Care | Attending: Cardiology | Admitting: Cardiology

## 2021-08-04 DIAGNOSIS — R5381 Other malaise: Secondary | ICD-10-CM

## 2021-08-04 DIAGNOSIS — I480 Paroxysmal atrial fibrillation: Secondary | ICD-10-CM

## 2021-08-04 DIAGNOSIS — I350 Nonrheumatic aortic (valve) stenosis: Secondary | ICD-10-CM | POA: Diagnosis not present

## 2021-08-04 DIAGNOSIS — Z952 Presence of prosthetic heart valve: Secondary | ICD-10-CM

## 2021-08-04 DIAGNOSIS — Z006 Encounter for examination for normal comparison and control in clinical research program: Secondary | ICD-10-CM | POA: Diagnosis not present

## 2021-08-04 DIAGNOSIS — Z20822 Contact with and (suspected) exposure to covid-19: Secondary | ICD-10-CM | POA: Diagnosis not present

## 2021-08-04 DIAGNOSIS — E871 Hypo-osmolality and hyponatremia: Secondary | ICD-10-CM | POA: Diagnosis not present

## 2021-08-04 LAB — CBC
HCT: 34.5 % — ABNORMAL LOW (ref 36.0–46.0)
Hemoglobin: 11 g/dL — ABNORMAL LOW (ref 12.0–15.0)
MCH: 29.3 pg (ref 26.0–34.0)
MCHC: 31.9 g/dL (ref 30.0–36.0)
MCV: 91.8 fL (ref 80.0–100.0)
Platelets: 387 10*3/uL (ref 150–400)
RBC: 3.76 MIL/uL — ABNORMAL LOW (ref 3.87–5.11)
RDW: 13.6 % (ref 11.5–15.5)
WBC: 12.3 10*3/uL — ABNORMAL HIGH (ref 4.0–10.5)
nRBC: 0 % (ref 0.0–0.2)

## 2021-08-04 LAB — ECHOCARDIOGRAM COMPLETE
AR max vel: 1.77 cm2
AV Area VTI: 1.75 cm2
AV Area mean vel: 1.77 cm2
AV Mean grad: 10.3 mmHg
AV Peak grad: 19.6 mmHg
Ao pk vel: 2.22 m/s
Area-P 1/2: 2.79 cm2
Height: 60 in
MV VTI: 2.11 cm2
S' Lateral: 2.6 cm
Weight: 2038.4 oz

## 2021-08-04 LAB — BASIC METABOLIC PANEL
Anion gap: 10 (ref 5–15)
BUN: 29 mg/dL — ABNORMAL HIGH (ref 8–23)
CO2: 22 mmol/L (ref 22–32)
Calcium: 8.9 mg/dL (ref 8.9–10.3)
Chloride: 101 mmol/L (ref 98–111)
Creatinine, Ser: 1.15 mg/dL — ABNORMAL HIGH (ref 0.44–1.00)
GFR, Estimated: 47 mL/min — ABNORMAL LOW (ref >60)
Glucose, Bld: 134 mg/dL — ABNORMAL HIGH (ref 70–99)
Potassium: 4.4 mmol/L (ref 3.5–5.1)
Sodium: 133 mmol/L — ABNORMAL LOW (ref 135–145)

## 2021-08-04 LAB — MAGNESIUM: Magnesium: 2.1 mg/dL (ref 1.7–2.4)

## 2021-08-04 MED ORDER — METOPROLOL TARTRATE 25 MG PO TABS
25.0000 mg | ORAL_TABLET | Freq: Once | ORAL | Status: AC
  Administered 2021-08-04: 25 mg via ORAL
  Filled 2021-08-04: qty 1

## 2021-08-04 MED ORDER — FUROSEMIDE 20 MG PO TABS
20.0000 mg | ORAL_TABLET | Freq: Every day | ORAL | Status: DC
  Administered 2021-08-04: 20 mg via ORAL
  Filled 2021-08-04: qty 1

## 2021-08-04 MED ORDER — POLYETHYLENE GLYCOL 3350 17 G PO PACK
17.0000 g | PACK | Freq: Every day | ORAL | Status: DC
Start: 2021-08-04 — End: 2021-08-04

## 2021-08-04 MED ORDER — ALUM & MAG HYDROXIDE-SIMETH 200-200-20 MG/5ML PO SUSP
15.0000 mL | Freq: Four times a day (QID) | ORAL | Status: DC | PRN
  Administered 2021-08-04: 15 mL via ORAL
  Filled 2021-08-04: qty 30

## 2021-08-04 MED ORDER — DILTIAZEM HCL ER COATED BEADS 120 MG PO CP24
120.0000 mg | ORAL_CAPSULE | Freq: Every day | ORAL | Status: DC
  Administered 2021-08-04: 120 mg via ORAL
  Filled 2021-08-04: qty 1

## 2021-08-04 MED ORDER — LISINOPRIL 10 MG PO TABS
10.0000 mg | ORAL_TABLET | Freq: Once | ORAL | Status: AC
  Administered 2021-08-04: 10 mg via ORAL
  Filled 2021-08-04: qty 1

## 2021-08-04 MED ORDER — FUROSEMIDE 20 MG PO TABS: 10.0000 mg | ORAL_TABLET | Freq: Two times a day (BID) | ORAL | Status: DC

## 2021-08-04 MED ORDER — ASPIRIN 81 MG PO CHEW: 81.0000 mg | CHEWABLE_TABLET | Freq: Every day | ORAL | Status: DC

## 2021-08-04 MED ORDER — FUROSEMIDE 20 MG PO TABS: 10.0000 mg | ORAL_TABLET | Freq: Two times a day (BID) | ORAL | 3 refills | Status: DC

## 2021-08-04 NOTE — Evaluation (Signed)
Physical Therapy Evaluation & Discharge Patient Details Name: Barbara Mcconnell MRN: [id number] DOB: Jan 22, 1935 Today's Date: 08/04/2021  History of Present Illness  Pt is an 87 y.o. female with severe aortic stenosis, admitted 08/03/21 for TAVR. PMH includes HTN, HLD, cardiomyopathy.   Clinical Impression  Patient evaluated by Physical Therapy with no further acute PT needs identified. PTA, pt mod indep with SPC, lives alone but will have increased assist from daughter as needed. Today, pt moving well with SPC and supervision for safety; intermittent standing rest breaks secondary to c/o fatigue and SOB. All education has been completed and the patient has no further questions. Acute PT is signing off. Thank you for this referral.    HR 96    Recommendations for follow up therapy are one component of a multi-disciplinary discharge planning process, led by the attending physician.  Recommendations may be updated based on patient status, additional functional criteria and insurance authorization.  Follow Up Recommendations Home health PT    Assistance Recommended at Discharge Intermittent Supervision/Assistance         Equipment Recommendations None recommended by PT  Recommendations for Other Services       Functional Status Assessment       Precautions / Restrictions Precautions Precautions: Fall Restrictions Weight Bearing Restrictions: No      Mobility  Bed Mobility               General bed mobility comments: Received sitting in recliner    Transfers Overall transfer level: Modified independent Equipment used: Straight cane Transfers: Sit to/from Stand             General transfer comment: mod indep standing from low toilet and recliner with SPC    Ambulation/Gait Ambulation/Gait assistance: Supervision Gait Distance (Feet): 200 Feet Assistive device: Straight cane Gait Pattern/deviations: Step-through pattern;Decreased stride length;Trunk flexed        General Gait Details: Slow, mostly steady gait with SPC and supervision for safety/lines; 2x brief standing rest breaks secondary to c/o fatigue and SOB  Stairs            Wheelchair Mobility    Modified Rankin (Stroke Patients Only)       Balance Overall balance assessment: Needs assistance Sitting-balance support: Feet supported Sitting balance-Leahy Scale: Good     Standing balance support: No upper extremity supported;During functional activity Standing balance-Leahy Scale: Fair Standing balance comment: stability improved with SPC                             Pertinent Vitals/Pain      Home Living Family/patient expects to be discharged to:: Private residence Living Arrangements: Alone Available Help at Discharge: Family;Available PRN/intermittently Type of Home: House Home Access: Level entry       Home Layout: One level Home Equipment: Cane - single point Additional Comments: Daughter plans to stay the first night or two to assist as needed    Prior Function Prior Level of Function : Independent/Modified Independent             Mobility Comments: Mod indep ambulating with SPC ADLs Comments: indep     Hand Dominance        Extremity/Trunk Assessment   Upper Extremity Assessment Upper Extremity Assessment: Overall WFL for tasks assessed    Lower Extremity Assessment Lower Extremity Assessment: Generalized weakness       Communication   Communication: No difficulties  Cognition Arousal/Alertness: Awake/alert Behavior During Therapy:  WFL for tasks assessed/performed;Restless Overall Cognitive Status: Within Functional Limits for tasks assessed                                 General Comments: pt agreeable, tangential at times and benefits from cues to redirect        General Comments      Exercises     Assessment/Plan    PT Assessment All further PT needs can be met in the next venue of care  PT  Problem List Decreased strength;Decreased balance;Decreased activity tolerance;Cardiopulmonary status limiting activity       PT Treatment Interventions      PT Goals (Current goals can be found in the Care Plan section)  Acute Rehab PT Goals Patient Stated Goal: "I'm going home today whether you approve it or not" PT Goal Formulation: All assessment and education complete, DC therapy    Frequency       Co-evaluation               AM-PAC PT "6 Clicks" Mobility  Outcome Measure Help needed turning from your back to your side while in a flat bed without using bedrails?: None Help needed moving from lying on your back to sitting on the side of a flat bed without using bedrails?: None Help needed moving to and from a bed to a chair (including a wheelchair)?: A Little Help needed standing up from a chair using your arms (e.g., wheelchair or bedside chair)?: None Help needed to walk in hospital room?: A Little Help needed climbing 3-5 steps with a railing? : A Little 6 Click Score: 21    End of Session Equipment Utilized During Treatment: Gait belt Activity Tolerance: Patient tolerated treatment well Patient left: in chair;with call bell/phone within reach Nurse Communication: Mobility status PT Visit Diagnosis: Other abnormalities of gait and mobility (R26.89);Muscle weakness (generalized) (M62.81)    Time: 7672-0947 PT Time Calculation (min) (ACUTE ONLY): 24 min   Charges:   PT Evaluation $PT Eval Moderate Complexity: 1 Mod     Mabeline Caras, PT, DPT Acute Rehabilitation Services  Pager [phone] Office [place] 08/04/2021, 1:37 PM

## 2021-08-04 NOTE — TOC Transition Note (Signed)
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 Per PT recommendations for HHPT, no DME recommendations or needs at this time.  CM in to speak with pt at bedside- discussed recommendations for [facility]- which pt is agreeable to referral at this time for HHPT. List provided to pt for [facility] choice- Per CMS guidelines from medicare.gov website with star ratings (copy placed in shadow chart) - after review of list and star ratings- pt has selected [facility] as first choice if in-network with insurance and [facility] as second choice.   Address, phone #s (Holly-daughter to be used as backup), and PCP all confirmed in epic.   Call made to Amy with [facility] for HHPT referral- after checking insurance- Iantha Fallen is found to be OON with [facility] plan and unable to accept referral.  Call made to Marylene Land with [facility] for HHPT referral as second choice- [facility] is in net-work and able to accept referral- start of care will be planned for [facility] or Friday this week. They will contact pt to set up visit.    Final next level of care: Home w Home Health Services Barriers to Discharge: Barriers Resolved   Patient Goals and CMS Choice Patient states their goals for this hospitalization and ongoing recovery are:: return home CMS Medicare.gov Compare Post Acute Care list provided to:: Patient Choice offered to / list presented to : Patient  Discharge Placement               Home w/ [facility]        Discharge Plan and Services In-house Referral: NA Discharge Planning Services: CM Consult Post Acute Care Choice: Home Health          DME  Arranged: N/A DME Agency: NA       HH Arranged: PT HH Agency: [facility] Date [facility] Agency Contacted: 08/04/21 Time HH Agency Contacted: [id number] Representative spoke with at [facility] Agency: Marylene Land  Social Determinants of Health (SDOH) Interventions     Readmission Risk Interventions Readmission Risk Prevention Plan 08/04/2021  Transportation Screening Complete  PCP or Specialist Appt within 5-7 Days Complete  Home Care Screening Complete  Medication Review (RN CM) Complete  Some recent data might be hidden

## 2021-08-04 NOTE — Progress Notes (Addendum)
CARDIAC REHAB PHASE I   PRE:  Rate/Rhythm: 92 SR    BP: sitting 164/73    SaO2: 95 RA  MODE:  Ambulation: 140 ft   POST:  Rate/Rhythm: 105 ST    BP: sitting 180/76     SaO2: 95 RA  Upon entering room pt anxious and SOB in bed with talking. Pt conversation is difficult to follow, she is concerned about small things, not as concerned regarding her breathing. Ambulated in hall with contact guard. Does admit to SOB, 2/4. Not clear if her SOB was worse walking than at rest. To recliner, BP elevated.   Discussed with pt and daughter restrictions, walking, and CRPII. Daughter is concerned about pt being able to do ADLs at home. Appears that her main limiting factor is SOB. Will refer to [facility]. She needs to practice IS more, only 250-500 ml at best. [phone]  Harriet Masson CES, ACSM 08/04/2021 10:44 AM

## 2021-08-04 NOTE — Progress Notes (Signed)
Pt discharged home with daughter. IVs and telemetry box removed. Daughter [name] stated that [place], [place] reviewed discharge instructions with her. All questions answered. Copy of AVS sent with pt. Pt left with all of her belongings. Pt transported down to the main entrance via wheelchair.

## 2021-08-04 NOTE — Progress Notes (Signed)
Patients BP 192/92 (116)   Verbal order from [facility], MD to reorder metoprolol (25mg ) and lisinopril (10mg ).

## 2021-08-04 NOTE — Discharge Summary (Addendum)
[facility]VE TEAM  Discharge Summary    Patient ID: Barbara Mcconnell MRN: [id number]; DOB: October 31, 1934  Admit date: 08/03/2021 Discharge date: 08/04/2021  Primary Care Provider: Kristopher Glee., MD  Primary Cardiologist: Peter Martinique, MD / Dr. Ali Lowe, MD/ Dr. Gilford Raid, MD (TAVR)   Discharge Diagnoses    Principal Problem:   S/P TAVR (transcatheter aortic valve replacement) Active Problems:   Dyslipidemia, goal LDL below 70   Carotid arterial disease (HCC)   Takotsubo cardiomyopathy   Severe aortic stenosis   CAD (coronary artery disease)   Pericardial effusion   Hypertension   Other cirrhosis of liver (HCC)   Hyponatremia   PAF (paroxysmal atrial fibrillation) (HCC)   Physical deconditioning  Allergies Allergies  Allergen Reactions   Codeine Nausea And Vomiting and Nausea Only   Cyclobenzaprine    Iodinated Contrast Media    Prednisone Other (See Comments)    Sores in mouth   Diagnostic Studies/Procedures    TAVR OPERATIVE NOTE     Date of Procedure:                08/03/2021   Preoperative Diagnosis:      Severe Aortic Stenosis    Postoperative Diagnosis:    Same    Procedure:        Transcatheter Aortic Valve Replacement - Transfemoral Approach             Edwards Sapien 3 Resilia THV (size 24mm, model # 9755RLS, serial # # [id number], serial # [id number])              Co-Surgeons:                         Gaye Pollack, MD and Lenna Sciara, MD Anesthesiologist:                  Collins Scotland, MD   Echocardiographer:              Edmonia James, MD   Pre-operative Echo Findings: Severe aortic stenosis Normal left ventricular systolic function   Post-operative Echo Findings: No paravalvular leak Normal left ventricular systolic function _____________   Echo 08/04/21: Completed but pending formal read at the time of discharge   History of Present Illness     Barbara Mcconnell is a 87 y.o. female with a history of HTN, HLD,  peripheral vascular disease with moderate carotid artery stenosis, hx of Takotsubo cardiomyopathy with LV normalization, recent pericardial effusion requiring pericardial drain 07/2021, and severe aortic stenosis who presented to [facility] on 08/03/20 for planned TAVR.   Barbara Mcconnell is a very functional 87yo who lives alone in [place]. She has one daughter who lives locally and is very involved with her care. She was initially seen by Dr. Martinique after presenting to [facility] 05/13/2019 with chest pain. She had previously been hospitalized about 5 years prior and was told that she had Takotsubo cardiomyopathy with mild non-obstructive CAD which had been treated medically with normalization of LV function. She had been doing very well but developed sub-sternal chest pain with weakness and diaphoresis and presented to [facility] 04/2019 for further evaluation, found to have non-obstructive CAD per cardiac catheterization with severe LV dysfunction at 35-40% with wall motion abnormalities consistent with Takotsubo's cardiomyopathy. Echocardiogram 05/14/19 confirmed EF at 35-40% with moderate AS with a mean gradient at 21.5 mmHg, peak gradient at 44.7 mmHg, and AVA by VTI measuring 2.64  cm. Her medications were adjusted and she was seen in cardiology follow up several times in 2020 however was lost to follow up until admission 07/2021. It appears that she had a follow up echocardiogram 09/2019 which showed normalized LV function to 60-65% with no wall motion abnormalities moderate to severe aortic valve stenosis with AVA at 0.85 cm, mean gradient measures 20.5 mmHg and peak gradient at 31.5mmHg.    On structural heart evaluation, she reported that she felt great until about 5 weeks prior to admission when she began to notice more exertional dyspnea. Activities which used to be easy for her became much harder due to SOB. She denied chest pain, palpitations, orthopnea, LE edema, dizziness, or syncope. She reported that she was  participating in regular exercise with silver sneakers three times per week without an issue. More recently, as her symptoms progressed, she followed closely with her PCP along with a pulmonary team and and was started on low dose Lasix. She then saw her PCP 07/21/21 and was found to be tachypneic and was therefore sent to the ED for further assessment.   On arrival she was quite hypertensive with a BP at 201/109. Bedside echocardiogram performed by EDP showed pericardial effusion. CXR showed vascular congestion and small bilateral pleural effusion. BNP 201. HsT 63>>56>>51>>48. LFTs elevated on arrival however down trended with diuresis. Creatine 0.93>>1.09>>1.00. Right upper quarter ultrasound was unremarkable. Repeat formal echocardiogram showed normal LVEF with a moderate circumfrential pericardial effusion measuring 1.5 cm. There was moderate AS with a mean gradient at 24mmHg (was 20.50mmhg), AVA 1.38cm and DI 0.29. She underwent [facility] 07/23/21 which showed stable, mild to moderate CAD with hemodynamics consistent with pulmonary hypertension. MD unable to cross aortic valve during procedure. Due to moderate AS, she was initially felt to be a good PROGRESS trial candidate however with discrepancies between echocardiogram findings and inability to cross AV, pre-TAVR CT imaging was obtained. These showed an AVA 1.01cm2, annular area measuring 422mm2, and large circumferential pericardial effusion. After further discussion, she opted to defer PROGRESS trail consideration and move forward with TAVR given progressive symptoms, LVH with small LV cavity and calcium score at 2093. Her hospital stay was further complicated by new onset atrial fibrillation with RVR. She was placed on diltiazem infusion with relatively quick conversion to NSR. She ultimately underwent pericardiocentesis 07/28/21 and drain placement. This was discontinued 07/29/21 and the patient was discharged from the hospital 07/30/21.   Prior to d/c, she  was evaluated by our multidisciplinary valve team with Dr. Cyndia Bent and felt to have symptomatic, stage D aortic stenosis with Class III symptoms and felt to be a suitable candidate for TAVR. Plan at that time was for hospital discharge and scheduled TAVR on 08/03/21.    Hospital Course     Severe AS: s/p successful TAVR with a 23 mm Edwards Sapien 3 Resilia via the TF approach on 08/03/21. Post operative echo pending. Groin sites are stable. ECG with NSR and frequent PVCs however no high grade heart block. Plan to start ASA for now. Plan to obtain ZIO and assess for recurrent atrial fibrillation as below. If recurrent AF, will need to transition from ASA to Eliquis monotherapy. SBE discussed however will prescribe at follow up with myself next week. The patient has ambulated with CRI. Remains rather SOB with speaking. Plan to order PT/OT evaluation for possible home assistance. Daughter has private pay [facility] as well. Continue with IS at home. Daughter concerned about patients functional capacity at home alone.Acute PT  ordered for full evaluation with recommendations for [facility] PT at discharge. Post TAVR care reviewed and placed in AVS.   Hx of recent pericardial effusion: On prior admission 06/2021, patient found to have large pericardial effusion on pre TAVR CT imaging. Underwent pericardiocentesis with 466ml bloody drainage. Fluid sent to cytology with no malignancy noted thus far on cytology reporting. Post TAVR echocardiogram with pending results today.    Paroxysmal atrial fibrillation: Patient went into AF with RVR on 07/23/21 and was placed on IV diltiazem with quick conversion to NSR. Plan was to hold off on anticoagulation at that time until after TAVR. Plan to place ZIO prior to discharge today to assess for recurrence. If no recurrence, will plan to continue ASA 81mg . If AF noted, will stop ASA and start Eliquis 2.5mg  BID (age >80yo; weight <60kg)   Carotid artery disease: Pre TAVR head/neck CTA with  significant carotid artery stenosis with plans for surveillance dopplers in the OP setting. Scheduled for VVS consult 09/03/21. Continue ASA, statin.    Chronic diastolic CHF: Appears euvolemic on exam. Continue Lasix 20mg  PO QD. Check BMET at follow up Monday 08/09/21.    HTN: Restarted on PTA antihypertensives this AM.    Hyponatremia: Has known chronic hyponatremia. Improved from last hospitalization with Na+ in the 125-130 range. Lexapro stopped at last d/c.   Severe anxiety: Lexapro stopped as above. Needs to follow up with PCP for alternative SSRI.   Physical deconditioning: The patient ambulated with CRI with significant SOB. Remains rather SOB with speaking after rest. Plan to order PT/OT evaluation for possible home assistance. Daughter has private pay [facility] as well. Continue with IS at home. Daughter concerned about patients functional capacity at home alone. Inpatient PT ordered with recommendation for [facility] PT at discharge. May need OP pulmonary referral for PFTs and formal evaluation.   Consultants: PT   The patient was seen and examined by Dr. Ali Lowe who feels that she is stable and ready for discharge today, 08/04/21.  _____________  Discharge Vitals Blood pressure (!) 173/72, pulse 88, temperature 97.7 F (36.5 C), temperature source Oral, resp. rate 15, height 5' (1.524 m), weight 57.8 kg, SpO2 93 %.  Filed Weights   08/03/21 0934 08/04/21 0500  Weight: 54.7 kg 57.8 kg   General: Well developed, well nourished, NAD Neck: Negative for carotid bruits. No JVD Lungs:Diminished in bilateral lower lobes. No wheezes, rales, or rhonchi. Breathing is labored with speaking.  Cardiovascular: RRR with S1 S2. Very soft flow murmur Abdomen: Soft, non-tender, non-distended. No obvious abdominal masses. Extremities: No edema. Neuro: Alert and oriented. No focal deficits. No facial asymmetry. MAE spontaneously. Psych: Responds to questions appropriately with normal affect.    Labs &  Radiologic Studies    CBC Recent Labs    08/03/21 1542 08/04/21 0452  WBC  --  12.3*  HGB 10.2* 11.0*  HCT 30.0* 34.5*  MCV  --  91.8  PLT  --  [id number]   Basic Metabolic Panel Recent Labs    08/03/21 1542 08/04/21 0452  NA 131* 133*  K 4.3 4.4  CL 99 101  CO2  --  22  GLUCOSE 146* 134*  BUN 25* 29*  CREATININE 1.00 1.15*  CALCIUM  --  8.9  MG  --  2.1   Liver Function Tests No results for input(s): AST, ALT, ALKPHOS, BILITOT, PROT, ALBUMIN in the last 72 hours. No results for input(s): LIPASE, AMYLASE in the last 72 hours. Cardiac Enzymes No results for input(s):  CKTOTAL, CKMB, CKMBINDEX, TROPONINI in the last 72 hours. BNP Invalid input(s): POCBNP D-Dimer No results for input(s): DDIMER in the last 72 hours. Hemoglobin A1C No results for input(s): HGBA1C in the last 72 hours. Fasting Lipid Panel No results for input(s): CHOL, HDL, LDLCALC, TRIG, CHOLHDL, LDLDIRECT in the last 72 hours. Thyroid Function Tests No results for input(s): TSH, T4TOTAL, T3FREE, THYROIDAB in the last 72 hours.  Invalid input(s): FREET3 _____________  DG Chest 2 View  Result Date: 07/21/2021 CLINICAL DATA:  Shortness of breath. EXAM: CHEST - 2 VIEW COMPARISON:  Chest radiograph dated 07/18/2021. FINDINGS: Cardiomegaly with vascular congestion. Probable small bilateral pleural effusions and bibasilar atelectasis or infiltrate. No pneumothorax. Atherosclerotic calcification of the aorta. No acute osseous pathology. IMPRESSION: Cardiomegaly with vascular congestion and small bilateral pleural effusions. Electronically Signed   By: Anner Crete M.D.   On: 07/21/2021 19:28   CT CHEST W CONTRAST  Result Date: 07/24/2021 CLINICAL DATA:  Thoracic aorta disease, post-op; Aneurysm, pelvis or lower extremity. Aneurysm, pelvis or lower extremity. Severe symptomatic aortic stenosis. EXAM: CT ANGIOGRAPHY CHEST, ABDOMEN AND PELVIS TECHNIQUE: Multidetector CT imaging through the chest, abdomen and  pelvis was performed using the standard protocol during bolus administration of intravenous contrast. Multiplanar reconstructed images and MIPs were obtained and reviewed to evaluate the vascular anatomy. CONTRAST:  174mL OMNIPAQUE IOHEXOL 350 MG/ML SOLN COMPARISON:  07/18/2021 FINDINGS: CTA CHEST FINDINGS Cardiovascular: The thoracic aorta is of normal caliber, measuring 3.6 x 3.7 cm in its ascending segment, 2.9 x 2.6 cm within the arch just beyond the takeoff of the left subclavian artery, and 2.6 x 2.2 cm within its descending segment at the level of the left atrium. No intramural hematoma or dissection. Moderate calcified atherosclerotic plaque. Conventional arch vessel anatomic configuration. Less than 50% stenosis of the left subclavian artery at its origin. Extensive, irregular mixed atherosclerotic plaque at the right carotid bifurcation results in a greater than 90% stenosis of the proximal right internal carotid artery calcified atherosclerotic plaque at the left carotid bifurcation results in a less than 50% stenosis the proximal left internal carotid artery. Calcified atherosclerotic plaque results in a 50% stenosis of the dominant left vertebral artery at its origin. Mild cardiomegaly with left ventricular hypertrophy noted. Extensive atherosclerotic calcification of the aortic valve leaflets. The aortic valve is trileaflet. Moderate coronary artery calcification with probable stenting of the proximal right coronary artery. Moderate pericardial effusion is present, enlarged in size when compared to prior examination. Pericardial fluid appears slightly hyperdense suggesting proteinaceous or hemorrhagic fluid. No CT evidence of cardiac tamponade. The central pulmonary arteries are of normal caliber. Mediastinum/Nodes: No enlarged mediastinal, hilar, or axillary lymph nodes. Thyroid gland, trachea, and esophagus demonstrate no significant findings. Lungs/Pleura: Small bilateral pleural effusions are  present with compressive atelectasis of the a lower lobes bilaterally, left greater than right. There is cylindrical bronchiectasis and volume loss noted within the lingula and basilar right middle lobe. Mild peribronchial nodularity within these regions may reflect the sequela of remote inflammation or a chronic atypical infectious process as can be seen with mycobacterial or fungal infection. No confluent pulmonary infiltrate. No pneumothorax. No central obstructing lesion. Musculoskeletal: No acute bone abnormality. No lytic or blastic bone lesion. Review of the MIP images confirms the above findings. CTA ABDOMEN AND PELVIS FINDINGS VASCULAR Aorta: Normal caliber. No aneurysm or dissection. Moderate atherosclerotic calcification. No periaortic inflammatory change. Celiac: Less than 50% stenosis of the a celiac origin secondary to calcified atherosclerotic plaque. Distally widely patent. No  aneurysm or dissection. SMA: Patent without evidence of aneurysm, dissection, vasculitis or significant stenosis. Renals: Single renal arteries bilaterally. Less than 50% stenosis of the origin of the right renal artery. Widely patent left renal artery. Both arteries demonstrate a a beaded appearance within there mid segments in keeping with changes of fibromuscular dysplasia. No aneurysm or dissection. IMA: 50-75% stenosis of the origin.  Distally widely patent. Inflow: Mild atherosclerotic calcification. No evidence of hemodynamically significant stenosis. No aneurysm or dissection. Common iliac arteries measure 8-9 mm in diameter bilaterally. External iliac arteries measure 6 mm in diameter bilaterally. Common femoral arteries measure 7 mm in diameter bilaterally. Veins: No obvious venous abnormality within the limitations of this arterial phase study. Review of the MIP images confirms the above findings. NON-VASCULAR Hepatobiliary: Geographic region of hyperenhancement within segment 2 of the liver may relate to underlying  arterial portal shunting with a resultant transient hepatic attenuation difference (THAD). Similar region is seen within segment 4B of the liver. There are at least 3 arterial enhancing rounded lesions within segment 4 and segment 8 of the liver measuring up to 8 mm (image # 137/4) which are nonspecific but may represent small flash filled hemangioma. The liver is otherwise unremarkable. Gallbladder unremarkable. No intra or extrahepatic biliary ductal dilation. Pancreas: Unremarkable Spleen: Unremarkable Adrenals/Urinary Tract: The adrenal glands are unremarkable. The kidneys are normal in position. Moderate left renal asymmetric cortical atrophy. No enhancing intrarenal masses. No hydronephrosis. No intrarenal or ureteral calculi. The bladder is unremarkable. Stomach/Bowel: Stomach is within normal limits. Appendix appears normal. No evidence of bowel wall thickening, distention, or inflammatory changes. Lymphatic: No pathologic adenopathy within the abdomen and pelvis. Reproductive: Uterus and bilateral adnexa are unremarkable. Other: Surgical dressing noted within the right inguinal region. No abdominal wall hernia. Musculoskeletal: Degenerative changes are noted within the lumbar spine with grade 1 anterolisthesis of L4-5. No acute bone abnormality. No lytic or blastic bone lesion. Review of the MIP images confirms the above findings. IMPRESSION: No evidence of thoracoabdominal aortic aneurysm or dissection. Peripheral vascular disease with greater than 90% stenosis of the proximal right internal carotid artery. Plaque morphology appears irregular and may be better assessed with dedicated carotid artery Doppler sonography. 50% stenosis of the a dominant left vertebral artery at its origin. 50-75% stenosis of the inferior mesenteric artery at its origin, though this is of questionable clinical significance given patency of the celiac axis and superior mesenteric artery. Morphologic changes in keeping with  fibromuscular dysplasia involving the renal arteries bilaterally. Cardiomegaly with left ventricular hypertrophy. Extensive calcification of the aortic valve leaflets. Enlarging, moderate pericardial effusion without definite CT evidence of cardiac tamponade. Note, however, small bilateral pleural effusions have developed since prior examination and can be seen the setting of progressive cardiogenic failure. Pericardial fluid appears slightly hyperdense suggesting a proteinaceous or hemorrhagic component. Cylindrical bronchiectasis and volume loss within the lingula and basilar right middle lobe with associated peribronchial nodularity. This may reflect the sequela of remote inflammation or reflect changes of a chronic infectious process such as atypical mycobacterial or fungal pneumonia. Multiple subcentimeter arterially enhancing lesions within the liver possibly representing small flash filled hemangioma. These are not optimally characterized on this examination may be better assessed with dedicated MRI examination if clinically indicated. Aortic Atherosclerosis (ICD10-I70.0). Electronically Signed   By: Fidela Salisbury M.D.   On: 07/24/2021 21:16   CT ANGIO ABDOMEN W &/OR WO CONTRAST  Result Date: 07/24/2021 CLINICAL DATA:  Thoracic aorta disease, post-op; Aneurysm, pelvis or lower extremity.  Aneurysm, pelvis or lower extremity. Severe symptomatic aortic stenosis. EXAM: CT ANGIOGRAPHY CHEST, ABDOMEN AND PELVIS TECHNIQUE: Multidetector CT imaging through the chest, abdomen and pelvis was performed using the standard protocol during bolus administration of intravenous contrast. Multiplanar reconstructed images and MIPs were obtained and reviewed to evaluate the vascular anatomy. CONTRAST:  17mL OMNIPAQUE IOHEXOL 350 MG/ML SOLN COMPARISON:  07/18/2021 FINDINGS: CTA CHEST FINDINGS Cardiovascular: The thoracic aorta is of normal caliber, measuring 3.6 x 3.7 cm in its ascending segment, 2.9 x 2.6 cm within the  arch just beyond the takeoff of the left subclavian artery, and 2.6 x 2.2 cm within its descending segment at the level of the left atrium. No intramural hematoma or dissection. Moderate calcified atherosclerotic plaque. Conventional arch vessel anatomic configuration. Less than 50% stenosis of the left subclavian artery at its origin. Extensive, irregular mixed atherosclerotic plaque at the right carotid bifurcation results in a greater than 90% stenosis of the proximal right internal carotid artery calcified atherosclerotic plaque at the left carotid bifurcation results in a less than 50% stenosis the proximal left internal carotid artery. Calcified atherosclerotic plaque results in a 50% stenosis of the dominant left vertebral artery at its origin. Mild cardiomegaly with left ventricular hypertrophy noted. Extensive atherosclerotic calcification of the aortic valve leaflets. The aortic valve is trileaflet. Moderate coronary artery calcification with probable stenting of the proximal right coronary artery. Moderate pericardial effusion is present, enlarged in size when compared to prior examination. Pericardial fluid appears slightly hyperdense suggesting proteinaceous or hemorrhagic fluid. No CT evidence of cardiac tamponade. The central pulmonary arteries are of normal caliber. Mediastinum/Nodes: No enlarged mediastinal, hilar, or axillary lymph nodes. Thyroid gland, trachea, and esophagus demonstrate no significant findings. Lungs/Pleura: Small bilateral pleural effusions are present with compressive atelectasis of the a lower lobes bilaterally, left greater than right. There is cylindrical bronchiectasis and volume loss noted within the lingula and basilar right middle lobe. Mild peribronchial nodularity within these regions may reflect the sequela of remote inflammation or a chronic atypical infectious process as can be seen with mycobacterial or fungal infection. No confluent pulmonary infiltrate. No  pneumothorax. No central obstructing lesion. Musculoskeletal: No acute bone abnormality. No lytic or blastic bone lesion. Review of the MIP images confirms the above findings. CTA ABDOMEN AND PELVIS FINDINGS VASCULAR Aorta: Normal caliber. No aneurysm or dissection. Moderate atherosclerotic calcification. No periaortic inflammatory change. Celiac: Less than 50% stenosis of the a celiac origin secondary to calcified atherosclerotic plaque. Distally widely patent. No aneurysm or dissection. SMA: Patent without evidence of aneurysm, dissection, vasculitis or significant stenosis. Renals: Single renal arteries bilaterally. Less than 50% stenosis of the origin of the right renal artery. Widely patent left renal artery. Both arteries demonstrate a a beaded appearance within there mid segments in keeping with changes of fibromuscular dysplasia. No aneurysm or dissection. IMA: 50-75% stenosis of the origin.  Distally widely patent. Inflow: Mild atherosclerotic calcification. No evidence of hemodynamically significant stenosis. No aneurysm or dissection. Common iliac arteries measure 8-9 mm in diameter bilaterally. External iliac arteries measure 6 mm in diameter bilaterally. Common femoral arteries measure 7 mm in diameter bilaterally. Veins: No obvious venous abnormality within the limitations of this arterial phase study. Review of the MIP images confirms the above findings. NON-VASCULAR Hepatobiliary: Geographic region of hyperenhancement within segment 2 of the liver may relate to underlying arterial portal shunting with a resultant transient hepatic attenuation difference (THAD). Similar region is seen within segment 4B of the liver. There are at least 3  arterial enhancing rounded lesions within segment 4 and segment 8 of the liver measuring up to 8 mm (image # 137/4) which are nonspecific but may represent small flash filled hemangioma. The liver is otherwise unremarkable. Gallbladder unremarkable. No intra or  extrahepatic biliary ductal dilation. Pancreas: Unremarkable Spleen: Unremarkable Adrenals/Urinary Tract: The adrenal glands are unremarkable. The kidneys are normal in position. Moderate left renal asymmetric cortical atrophy. No enhancing intrarenal masses. No hydronephrosis. No intrarenal or ureteral calculi. The bladder is unremarkable. Stomach/Bowel: Stomach is within normal limits. Appendix appears normal. No evidence of bowel wall thickening, distention, or inflammatory changes. Lymphatic: No pathologic adenopathy within the abdomen and pelvis. Reproductive: Uterus and bilateral adnexa are unremarkable. Other: Surgical dressing noted within the right inguinal region. No abdominal wall hernia. Musculoskeletal: Degenerative changes are noted within the lumbar spine with grade 1 anterolisthesis of L4-5. No acute bone abnormality. No lytic or blastic bone lesion. Review of the MIP images confirms the above findings. IMPRESSION: No evidence of thoracoabdominal aortic aneurysm or dissection. Peripheral vascular disease with greater than 90% stenosis of the proximal right internal carotid artery. Plaque morphology appears irregular and may be better assessed with dedicated carotid artery Doppler sonography. 50% stenosis of the a dominant left vertebral artery at its origin. 50-75% stenosis of the inferior mesenteric artery at its origin, though this is of questionable clinical significance given patency of the celiac axis and superior mesenteric artery. 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 Cardiomegaly with left ventricular hypertrophy. Extensive calcification of the aortic valve leaflets. Enlarging, moderate pericardial effusion without definite CT evidence of cardiac tamponade. Note, however, small bilateral pleural effusions have developed since prior examination and can be seen the setting of progressive cardiogenic failure. Pericardial fluid  appears slightly hyperdense suggesting a proteinaceous or hemorrhagic component. Cylindrical bronchiectasis and volume loss within the lingula and basilar right middle lobe with associated peribronchial nodularity. This may reflect the sequela of remote inflammation or reflect changes of a chronic infectious process such as atypical mycobacterial or fungal pneumonia. Multiple subcentimeter arterially enhancing lesions within the liver possibly representing small flash filled hemangioma. These are not optimally characterized on this examination may be better assessed with dedicated MRI examination if clinically indicated. Aortic Atherosclerosis (ICD10-I70.0). Electronically Signed   By: Fidela Salisbury M.D.   On: 07/24/2021 21:16   CARDIAC CATHETERIZATION  Result Date: 07/28/2021 1.  Successful pericardiocentesis and drain placement with evacuation of 400 cc of dark bloody fluid which was sent for analysis; the patient be transferred to the to heart unit for further monitoring.   CARDIAC CATHETERIZATION  Result Date: 07/23/2021   Prox RCA lesion is 50% stenosed.   Prox Cx to Mid Cx lesion is 25% stenosed.   Prox LAD to Mid LAD lesion is 25% stenosed.   Hemodynamic findings consistent with pulmonary hypertension. Stable, mild to moderate coronary artery disease.  No indication for PCI. Unable to cross aortic valve, consistent with worsening noted on echocardiogram.  Continue plans for TAVR w/u.   CT CORONARY MORPH W/CTA COR W/SCORE W/CA W/CM &/OR WO/CM  Addendum Date: 07/27/2021   ADDENDUM REPORT: 07/27/2021 19:04 ADDENDUM: Extracardiac findings will be described separately under dictation for contemporaneously obtained CTA chest, abdomen, and pelvis. IMPRESSION: Please see separate dictation for contemporaneously obtained CTA chest, abdomen, and pelvis dated 07/24/2021 for full description of relevant extracardiac findings. Electronically Signed   By: Fidela Salisbury M.D.   On: 07/27/2021 19:04   Result  Date: 07/27/2021 CLINICAL  DATA:  Aortic Stenosis EXAM: Cardiac TAVR CT TECHNIQUE: The patient was scanned on a Siemens Force [id number] slice scanner. A 120 kV retrospective scan was triggered in the ascending thoracic aorta at 140 HU's. Gantry rotation speed was 250 msecs and collimation was .6 mm. No beta blockade or nitro were given. The 3D data set was reconstructed in 5% intervals of the R-R cycle. Systolic and diastolic phases were analyzed on a dedicated work station using MPR, MIP and VRT modes. The patient received 80 cc of contrast. FINDINGS: Aortic Valve: Tri leaflet calcified score 2093 Aorta: Bovine arch no aneurysm moderate calcific atherosclerosis Sino-tubular Junction: 25 nn Ascending Thoracic Aorta: 35 mm Aortic Arch: 27 mm Descending Thoracic Aorta: 27 mm Sinus of Valsalva Measurements: Non-coronary: 29.3 mm Right - coronary: 28.6 mm Left -   coronary: 28 mm Coronary Artery Height above Annulus: Left Main: 15 mm above annulus Right Coronary: 15.6 mm above annulus Virtual Basal Annulus Measurements: Average  diameter: 22.8 mm Perimeter: 74.9 mm Area: 407 mm2 AVA by planimetry 1.01 cm2 Coronary Arteries: Sufficient height above annulus for deployment Optimum Fluoroscopic Angle for Delivery: LAO 20 Caudal 19 degrees IMPRESSION: 1. Tri leaflet AV with calcium score 2093 and planimetered AVA 1.01 cm2 2.  Annular area of 407 mm2 suitable for a 23 mm Sapien 3 valve 3.  Coronary arteries sufficient height above annulus for deployment 4. Optimal angiographic angle for deployment LAO 20 Caudal 19 degrees 5.  Large circumferential pericardial effusion Jenkins Rouge Electronically Signed: By: Jenkins Rouge M.D. On: 07/25/2021 06:57   DG CHEST PORT 1 VIEW  Result Date: 08/04/2021 CLINICAL DATA:  Shortness of breath EXAM: PORTABLE CHEST 1 VIEW COMPARISON:  Portable exam 1126 hours compared to 07/21/2021 FINDINGS: Enlargement of cardiac silhouette post TAVR. Cardiac monitoring device projects over upper LEFT chest.  Mediastinal contours and pulmonary vascularity normal. Atherosclerotic calcification aorta. Emphysematous changes with bibasilar effusions and atelectasis greater on LEFT, increased from previous exam. No pulmonary infiltrate or pneumothorax. Bones demineralized. IMPRESSION: Emphysematous changes with bibasilar effusions and atelectasis, increased since previous exam especially on LEFT. Aortic Atherosclerosis (ICD10-I70.0). Electronically Signed   By: Lavonia Dana M.D.   On: 08/04/2021 11:34   ECHOCARDIOGRAM COMPLETE  Result Date: 08/04/2021    ECHOCARDIOGRAM REPORT   Patient Name:   Barbara Mcconnell Date of Exam: 08/04/2021 Medical Rec #:  [id number]    Height:       60.0 in Accession #:    [id number]   Weight:       127.4 lb Date of Birth:  12-06-1934     BSA:          1.541 m Patient Age:    10 years     BP:           164/91 mmHg Patient Gender: F            HR:           90 bpm. Exam Location:  Inpatient Procedure: 2D Echo, Cardiac Doppler and Color Doppler Indications:    Post TAVR  History:        Patient has prior history of Echocardiogram examinations, most                 recent 08/03/2021. Cardiomyopathy, Pericardial effusion,                 Arrythmias:Atrial Fibrillation; Risk Factors:Dyslipidemia and                 Hypertension.  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 Procedure Date: 08/03/21.  Sonographer:    Glo Herring Referring Phys: [id number] [place]  1. Left ventricular ejection fraction, by estimation, is 65 to 70%. The left ventricle has normal function. The left ventricle has no regional wall motion abnormalities. There is mild left ventricular hypertrophy of the basal-septal segment. Left ventricular diastolic parameters are consistent with Grade I diastolic dysfunction (impaired relaxation). Elevated left atrial pressure.  2. Right ventricular systolic function is normal. The right  ventricular size is normal. Tricuspid regurgitation signal is inadequate for assessing PA pressure.  3. Left atrial size was mildly dilated.  4. The mitral valve is degenerative. Mild mitral valve regurgitation. Mild mitral stenosis. The mean mitral valve gradient is 7.0 mmHg with average heart rate of 93 bpm. Severe mitral annular calcification.  5. There is trivial perivalvular leak in the vicinity of the right coronary ostium. The aortic valve has been repaired/replaced. Aortic valve regurgitation is mild. 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 Procedure Date: 08/03/21. Aortic valve mean gradient measures 10.2 mmHg. Aortic valve Vmax measures 2.22 m/s. Aortic valve acceleration time measures 58 msec.  6. The inferior vena cava is dilated in size with <50% respiratory variability, suggesting right atrial pressure of 15 mmHg. FINDINGS  Left Ventricle: Left ventricular ejection fraction, by estimation, is 65 to 70%. The left ventricle has normal function. The left ventricle has no regional wall motion abnormalities. The left ventricular internal cavity size was normal in size. There is  mild left ventricular hypertrophy of the basal-septal segment. Left ventricular diastolic parameters are consistent with Grade I diastolic dysfunction (impaired relaxation). Elevated left atrial pressure. Right Ventricle: The right ventricular size is normal. No increase in right ventricular wall thickness. Right ventricular systolic function is normal. Tricuspid regurgitation signal is inadequate for assessing PA pressure. Left Atrium: Left atrial size was mildly dilated. Right Atrium: Right atrial size was normal in size. Prominent Eustachian valve. Pericardium: Trivial pericardial effusion is present. Mitral Valve: The mitral valve is degenerative in appearance. Severe mitral annular calcification. Mild mitral valve regurgitation. Mild mitral valve stenosis. MV peak gradient, 15.5 mmHg. The  mean mitral valve gradient is 7.0 mmHg with average heart rate of 93 bpm. Tricuspid Valve: The tricuspid valve is normal in structure. Tricuspid valve regurgitation is mild. Aortic Valve: There is trivial perivalvular leak in the vicinity of the right coronary ostium. The aortic valve has been repaired/replaced. Aortic valve regurgitation is mild. Aortic valve mean gradient measures 10.2 mmHg. Aortic valve peak gradient measures 19.6 mmHg. Aortic valve area, by VTI measures 1.75 cm. 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 Procedure Date: 08/03/21. Pulmonic Valve: The pulmonic valve was grossly normal. Pulmonic valve regurgitation is trivial. Aorta: The aortic root and ascending aorta are structurally normal, with no evidence of dilitation. Venous: The inferior vena cava is dilated in size with less than 50% respiratory variability, suggesting right atrial pressure of 15 mmHg. IAS/Shunts: No atrial level shunt detected by color flow Doppler. Additional Comments: There is pleural effusion in the left lateral region.  LEFT VENTRICLE PLAX 2D LVIDd:         4.10 cm   Diastology LVIDs:         2.60 cm   LV  e' medial:    6.31 cm/s LV PW:         1.00 cm   LV E/e' medial:  18.9 LV IVS:        1.40 cm   LV e' lateral:   7.83 cm/s LVOT diam:     1.90 cm   LV E/e' lateral: 15.2 LV SV:         82 LV SV Index:   53 LVOT Area:     2.84 cm  RIGHT VENTRICLE             IVC RV Basal diam:  3.50 cm     IVC diam: 2.80 cm RV S prime:     10.80 cm/s LEFT ATRIUM             Index        RIGHT ATRIUM           Index LA diam:        4.10 cm 2.66 cm/m   RA Area:     14.60 cm LA Vol (A2C):   57.4 ml 37.25 ml/m  RA Volume:   31.90 ml  20.70 ml/m LA Vol (A4C):   34.7 ml 22.52 ml/m LA Biplane Vol: 45.4 ml 29.46 ml/m  AORTIC VALVE                     PULMONIC VALVE AV Area (Vmax):    1.77 cm      PV Vmax:       0.93 m/s AV Area (Vmean):   1.77 cm      PV Peak grad:  3.5 mmHg AV Area  (VTI):     1.75 cm AV Vmax:           221.50 cm/s AV Vmean:          149.500 cm/s AV VTI:            0.471 m AV Peak Grad:      19.6 mmHg AV Mean Grad:      10.2 mmHg LVOT Vmax:         138.67 cm/s LVOT Vmean:        93.500 cm/s LVOT VTI:          0.290 m LVOT/AV VTI ratio: 0.62  AORTA Ao Asc diam: 3.40 cm MITRAL VALVE MV Area (PHT): 2.79 cm     SHUNTS MV Area VTI:   2.11 cm     Systemic VTI:  0.29 m MV Peak grad:  15.5 mmHg    Systemic Diam: 1.90 cm MV Mean grad:  7.0 mmHg MV Vmax:       1.97 m/s MV Vmean:      124.3 cm/s MV Decel Time: 272 msec MV E velocity: 119.00 cm/s MV A velocity: 168.00 cm/s MV E/A ratio:  0.71 Mihai Croitoru MD Electronically signed by Sanda Klein MD Signature Date/Time: 08/04/2021/12:59:30 PM    Final    ECHOCARDIOGRAM COMPLETE  Result Date: 07/22/2021    ECHOCARDIOGRAM REPORT   Patient Name:   Barbara Mcconnell Date of Exam: 07/22/2021 Medical Rec #:  [id number]    Height:       61.0 in Accession #:    [id number]   Weight:       131.6 lb Date of Birth:  1934/08/04     BSA:          1.581 m Patient Age:    64 years     BP:  155/92 mmHg Patient Gender: F            HR:           102 bpm. Exam Location:  Inpatient Procedure: 2D Echo, Color Doppler and Cardiac Doppler STAT ECHO Indications:    I31.3 Pericardial effusion  History:        Patient has prior history of Echocardiogram examinations, most                 recent 09/23/2019. Risk Factors:Hypertension and Dyslipidemia.                 Remote history of Takotsubo x2.  Sonographer:    Raquel Sarna Senior RDCS Referring Phys: [place]  1. Left ventricular ejection fraction, by estimation, is 60 to 65%. The left ventricle has normal function. The left ventricle has no regional wall motion abnormalities. There is moderate concentric left ventricular hypertrophy. Left ventricular diastolic parameters are consistent with Grade I diastolic dysfunction (impaired relaxation). Elevated left ventricular end-diastolic  pressure.  2. Right ventricular systolic function is normal. The right ventricular size is normal. There is normal pulmonary artery systolic pressure.  3. Left atrial size was severely dilated.  4. Pericardial effusion measuring 1.5 cm. Increased compared with 09/2019. . Moderate pericardial effusion. The pericardial effusion is circumferential. There is no evidence of cardiac tamponade.  5. The mitral valve is degenerative. Trivial mitral valve regurgitation. No evidence of mitral stenosis. Moderate mitral annular calcification.  6. AoV not well visualized. Appears calcified. Vmax 3.4 m/s, MG 33 mmHG, AVA 1.38 cm2, DI 0.29. Moderate aortic stenosis. Mean gradient has increased from 20 mmHg 09/2019 to 33 mmHg now. The aortic valve is tricuspid. There is moderate calcification of the aortic valve. There is moderate thickening of the aortic valve. Aortic valve regurgitation is mild. Moderate aortic valve stenosis. Aortic valve area, by VTI measures 1.38 cm. Aortic valve mean gradient measures 33.0 mmHg. Aortic valve Vmax measures  3.41 m/s.  7. Aortic dilatation noted. There is mild dilatation of the ascending aorta, measuring 37 mm.  8. The inferior vena cava is dilated in size with >50% respiratory variability, suggesting right atrial pressure of 8 mmHg. FINDINGS  Left Ventricle: Left ventricular ejection fraction, by estimation, is 60 to 65%. The left ventricle has normal function. The left ventricle has no regional wall motion abnormalities. The left ventricular internal cavity size was normal in size. There is  moderate concentric left ventricular hypertrophy. Left ventricular diastolic parameters are consistent with Grade I diastolic dysfunction (impaired relaxation). Elevated left ventricular end-diastolic pressure. Right Ventricle: The right ventricular size is normal. No increase in right ventricular wall thickness. Right ventricular systolic function is normal. There is normal pulmonary artery systolic  pressure. The tricuspid regurgitant velocity is 1.66 m/s, and  with an assumed right atrial pressure of 8 mmHg, the estimated right ventricular systolic pressure is [id number] mmHg. Left Atrium: Left atrial size was severely dilated. Right Atrium: Right atrial size was normal in size. Pericardium: Pericardial effusion measuring 1.5 cm. Increased compared with 09/2019. A moderately sized pericardial effusion is present. The pericardial effusion is circumferential. There is no evidence of cardiac tamponade. Presence of epicardial fat layer. Thickening/calcification of pericardium present. Mitral Valve: The mitral valve is degenerative in appearance. Moderate mitral annular calcification. Trivial mitral valve regurgitation. No evidence of mitral valve stenosis. MV peak gradient, 17.1 mmHg. The mean mitral valve gradient is 7.0 mmHg. Tricuspid Valve: The tricuspid valve is grossly normal. Tricuspid valve regurgitation is  trivial. No evidence of tricuspid stenosis. Aortic Valve: AoV not well visualized. Appears calcified. Vmax 3.4 m/s, MG 33 mmHG, AVA 1.38 cm2, DI 0.29. Moderate aortic stenosis. Mean gradient has increased from 20 mmHg 09/2019 to 33 mmHg now. The aortic valve is tricuspid. There is moderate calcification of the aortic valve. There is moderate thickening of the aortic valve. Aortic valve regurgitation is mild. Moderate aortic stenosis is present. Aortic valve mean gradient measures 33.0 mmHg. Aortic valve peak gradient measures 46.5 mmHg. Aortic valve area, by VTI measures 1.38 cm. Pulmonic Valve: The pulmonic valve was grossly normal. Pulmonic valve regurgitation is trivial. No evidence of pulmonic stenosis. Aorta: Aortic dilatation noted. There is mild dilatation of the ascending aorta, measuring 37 mm. Venous: The inferior vena cava is dilated in size with greater than 50% respiratory variability, suggesting right atrial pressure of 8 mmHg. IAS/Shunts: The atrial septum is grossly normal.  LEFT VENTRICLE PLAX  2D LVIDd:         2.50 cm   Diastology LVIDs:         1.80 cm   LV e' medial:    5.66 cm/s LV PW:         1.30 cm   LV E/e' medial:  16.5 LV IVS:        1.60 cm   LV e' lateral:   3.48 cm/s LVOT diam:     1.91 cm   LV E/e' lateral: 26.8 LV SV:         85 LV SV Index:   54 LVOT Area:     2.87 cm  RIGHT VENTRICLE RV S prime:     13.30 cm/s TAPSE (M-mode): 2.3 cm LEFT ATRIUM             Index        RIGHT ATRIUM           Index LA diam:        2.90 cm 1.83 cm/m   RA Area:     14.50 cm LA Vol (A2C):   44.7 ml 28.27 ml/m  RA Volume:   36.00 ml  22.77 ml/m LA Vol (A4C):   69.0 ml 43.64 ml/m LA Biplane Vol: 56.0 ml 35.42 ml/m  AORTIC VALVE AV Area (Vmax):    1.41 cm AV Area (Vmean):   1.25 cm AV Area (VTI):     1.38 cm AV Vmax:           341.00 cm/s AV Vmean:          280.000 cm/s AV VTI:            0.614 m AV Peak Grad:      46.5 mmHg AV Mean Grad:      33.0 mmHg LVOT Vmax:         168.00 cm/s LVOT Vmean:        122.000 cm/s LVOT VTI:          0.296 m LVOT/AV VTI ratio: 0.48  AORTA Ao Root diam: 3.20 cm Ao Asc diam:  3.70 cm MITRAL VALVE                TRICUSPID VALVE MV Area (PHT): 4.24 cm     TR Peak grad:   11.0 mmHg MV Peak grad:  17.1 mmHg    TR Vmax:        166.00 cm/s MV Mean grad:  7.0 mmHg MV Vmax:       2.07 m/s  SHUNTS MV Vmean:      129.0 cm/s   Systemic VTI:  0.30 m MV Decel Time: 179 msec     Systemic Diam: 1.91 cm MV E velocity: 93.40 cm/s MV A velocity: 179.00 cm/s MV E/A ratio:  0.52 Skeet Latch MD Electronically signed by Skeet Latch MD Signature Date/Time: 07/22/2021/10:57:57 AM    Final    ECHOCARDIOGRAM LIMITED  Result Date: 08/03/2021    ECHOCARDIOGRAM LIMITED REPORT   Patient Name:   Barbara Mcconnell Date of Exam: 08/03/2021 Medical Rec #:  [id number]    Height:       60.0 in Accession #:    [id number]   Weight:       120.5 lb Date of Birth:  07/06/1935     BSA:          1.505 m Patient Age:    76 years     BP:           173/60 mmHg Patient Gender: F            HR:           76  bpm. Exam Location:  Inpatient Procedure: Limited Echo, Cardiac Doppler and Color Doppler Indications:     I35.0 Nonrheumatic aortic (valve) stenosis  History:         Patient has prior history of Echocardiogram examinations, most                  recent 07/29/2021. Previous Myocardial Infarction and CAD, Aortic                  Valve Disease, Signs/Symptoms:Murmur; Risk                  Factors:Hypertension. Severe aortic stenosis. Pericardial                  effusion. Takotsubo.  Sonographer:     Roseanna Rainbow RDCS Referring Phys:  [id number] [place] Diagnosing Phys: Jenkins Rouge MD IMPRESSIONS  1. Left ventricular ejection fraction, by estimation, is 65 to 70%. The left ventricle has normal function. There is moderate left ventricular hypertrophy. Left ventricular diastolic function could not be evaluated.  2. Right ventricular systolic function is normal. The right ventricular size is normal.  3. Left atrial size was mildly dilated.  4. The mitral valve is degenerative. Mild mitral valve regurgitation. Severe mitral annular calcification.  5. Pre TAVR : tri leaflet calcified valve with trivial AR and severe AS mean gradient 19 peak 41 mmHg indexed AVA 0.99 cm2         Post TAVR: well positioned 23 mm Sapien 3 ultra valve no PVL mean gradient 7 peak 12 mmhg . The aortic valve has been repaired/replaced. Aortic valve regurgitation is mild. Severe aortic valve stenosis. FINDINGS  Left Ventricle: Left ventricular ejection fraction, by estimation, is 65 to 70%. The left ventricle has normal function. The left ventricular internal cavity size was small. There is moderate left ventricular hypertrophy. Left ventricular diastolic function could not be evaluated. Right Ventricle: The right ventricular size is normal. No increase in right ventricular wall thickness. Right ventricular systolic function is normal. Left Atrium: Left atrial size was mildly dilated. Right Atrium: Right atrial size was normal in size. Mitral  Valve: The mitral valve is degenerative in appearance. There is severe thickening of the mitral valve leaflet(s). There is severe calcification of the mitral valve leaflet(s). Severe mitral annular calcification. Mild mitral valve regurgitation. MV peak gradient,  8.9 mmHg. Tricuspid Valve: Tricuspid valve regurgitation is mild. Aortic Valve: Pre TAVR : tri leaflet calcified valve with trivial AR and severe AS mean gradient 19 peak 41 mmHg indexed AVA 0.99 cm2 Post TAVR: well positioned 23 mm Sapien 3 ultra valve no PVL mean gradient 7 peak 12 mmhg. The aortic valve has been repaired/replaced. Aortic valve regurgitation is mild. Severe aortic stenosis is present. Aortic valve mean gradient measures 6.0 mmHg. Aortic valve peak gradient measures 10.1 mmHg. Aortic valve area, by VTI measures 3.52 cm. Aorta: Aortic root could not be assessed. IAS/Shunts: The interatrial septum was not assessed. LEFT VENTRICLE PLAX 2D LVOT diam:     2.10 cm LV SV:         129 LV SV Index:   86 LVOT Area:     3.46 cm  LV Volumes (MOD) LV vol d, MOD A2C: 41.0 ml LV vol d, MOD A4C: 33.3 ml LV vol s, MOD A2C: 16.4 ml LV vol s, MOD A4C: 12.1 ml LV SV MOD A2C:     24.6 ml LV SV MOD A4C:     33.3 ml LV SV MOD BP:      23.8 ml AORTIC VALVE AV Area (Vmax):    3.47 cm AV Area (Vmean):   3.50 cm AV Area (VTI):     3.52 cm AV Vmax:           158.67 cm/s AV Vmean:          111.667 cm/s AV VTI:            0.366 m AV Peak Grad:      10.1 mmHg AV Mean Grad:      6.0 mmHg LVOT Vmax:         159.00 cm/s LVOT Vmean:        113.000 cm/s LVOT VTI:          0.372 m LVOT/AV VTI ratio: 1.02  AORTA Ao Asc diam: 3.40 cm MITRAL VALVE MV Peak grad: 8.9 mmHg SHUNTS MV Vmax:      1.49 m/s Systemic VTI:  0.37 m                        Systemic Diam: 2.10 cm Jenkins Rouge MD Electronically signed by Jenkins Rouge MD Signature Date/Time: 08/03/2021/3:15:44 PM    Final    ECHOCARDIOGRAM LIMITED  Result Date: 07/29/2021    ECHOCARDIOGRAM LIMITED REPORT   Patient Name:    Barbara Mcconnell Date of Exam: 07/29/2021 Medical Rec #:  [id number]    Height:       60.0 in Accession #:    [id number]   Weight:       127.6 lb Date of Birth:  1934/09/10     BSA:          1.542 m Patient Age:    71 years     BP:           139/93 mmHg Patient Gender: F            HR:           58 bpm. Exam Location:  Inpatient Procedure: Limited Echo, Limited Color Doppler and Cardiac Doppler Indications:    pericardial effusion  History:        Patient has prior history of Echocardiogram examinations, most                 recent 07/28/2021. CAD, Aortic Valve Disease; Risk  Factors:Hypertension and Dyslipidemia.  Sonographer:    Delcie Roch RDCS Referring Phys: [id number] BRIAN S CRENSHAW IMPRESSIONS  1. Left ventricular ejection fraction, by estimation, is 60 to 65%. The left ventricle has normal function.  2. Right ventricular systolic function is normal. The right ventricular size is normal. There is normal pulmonary artery systolic pressure.  3. Pericardial effusion, overall small Largest along inferior surface of RV/RA,particularly at sulcus. Compared to echo from 07/28/21, no significant change No evidence of hemodynamic compromise by echo criteria.  4. Mild mitral valve regurgitation.  5. AV is thickened, calcified with restricted motion. Peak and mean gradients through the valve are 37 and 22 mm Hg respectively.. The aortic valve is tricuspid. Aortic valve regurgitation is mild.  6. The inferior vena cava is normal in size with greater than 50% respiratory variability, suggesting right atrial pressure of 3 mmHg. FINDINGS  Left Ventricle: Left ventricular ejection fraction, by estimation, is 60 to 65%. The left ventricle has normal function. Right Ventricle: The right ventricular size is normal. Right ventricular systolic function is normal. There is normal pulmonary artery systolic pressure. The tricuspid regurgitant velocity is 2.42 m/s, and with an assumed right atrial pressure of 3 mmHg,  the  estimated right ventricular systolic pressure is 26.4 mmHg. Left Atrium: Left atrial size was normal in size. Right Atrium: Right atrial size was normal in size. Pericardium: Pericardial effusion, overall small Largest along inferior surface of RV/RA,particularly at sulcus. Compared to echo from 07/28/21, no significant change No evidence of hemodynamic compromise by echo criteria. Mitral Valve: There is mild thickening of the mitral valve leaflet(s). Mild to moderate mitral annular calcification. Mild mitral valve regurgitation. Tricuspid Valve: The tricuspid valve is normal in structure. Tricuspid valve regurgitation is trivial. Aortic Valve: AV is thickened, calcified with restricted motion. Peak and mean gradients through the valve are 37 and 22 mm Hg respectively. The aortic valve is tricuspid. Aortic valve regurgitation is mild. Aortic regurgitation PHT measures 430 msec. Aortic valve mean gradient measures 22.0 mmHg. Aortic valve peak gradient measures 37.0 mmHg. Pulmonic Valve: The pulmonic valve was grossly normal. Aorta: The aortic root is normal in size and structure. Venous: The inferior vena cava is normal in size with greater than 50% respiratory variability, suggesting right atrial pressure of 3 mmHg. IAS/Shunts: No atrial level shunt detected by color flow Doppler. IVC IVC diam: 1.20 cm AORTIC VALVE AV Vmax:      304.00 cm/s AV Vmean:     215.000 cm/s AV VTI:       0.602 m AV Peak Grad: 37.0 mmHg AV Mean Grad: 22.0 mmHg AI PHT:       430 msec MITRAL VALVE                TRICUSPID VALVE MV Area (PHT): 2.73 cm     TR Peak grad:   23.4 mmHg MV Decel Time: 278 msec     TR Vmax:        242.00 cm/s MV E velocity: 107.00 cm/s MV A velocity: 143.00 cm/s MV E/A ratio:  0.75 Dietrich Pates MD Electronically signed by Dietrich Pates MD Signature Date/Time: 07/29/2021/5:52:51 PM    Final    ECHOCARDIOGRAM LIMITED  Result Date: 07/28/2021    ECHOCARDIOGRAM LIMITED REPORT   Patient Name:   Barbara Mcconnell Date of Exam:  07/28/2021 Medical Rec #:  [id number]    Height:       60.0 in Accession #:    [id number]   Weight:  129.7 lb Date of Birth:  02/01/1935     BSA:          1.553 m Patient Age:    46 years     BP:           190/87 mmHg Patient Gender: F            HR:           78 bpm. Exam Location:  Inpatient Procedure: Limited Echo, Limited Color Doppler and Cardiac Doppler Indications:     Pericardial effusion I31.3  History:         Patient has prior history of Echocardiogram examinations, most                  recent 07/22/2021. Takotsubo cardiomyopathy, CAD; Risk                  Factors:Dyslipidemia and Hypertension.  Sonographer:     Bernadene Person RDCS Referring Phys:  [id number] Early [place] Diagnosing Phys: Gwyndolyn Kaufman MD IMPRESSIONS  1. RV free wall not visualized, however, there appears to be only a trivial-to-small pericardial effusion present (best seen posterior to the LV). There is a prominent fat pad.  2. Left ventricular ejection fraction, by estimation, is 60 to 65%. The left ventricle has normal function. The left ventricle has no regional wall motion abnormalities. There is moderate left ventricular hypertrophy.  3. The aortic valve is tricuspid. There is moderate calcification of the aortic valve. There is moderate thickening of the aortic valve.  4. The inferior vena cava is dilated in size with >50% respiratory variability, suggesting right atrial pressure of 8 mmHg. Comparison(s): Compared to prior TTE on 07/22/21, the pericardial effusion is now significantly improved and now appears trivial to small (was previously moderate measuring up to 1.5cm). FINDINGS  Left Ventricle: Left ventricular ejection fraction, by estimation, is 60 to 65%. The left ventricle has normal function. The left ventricle has no regional wall motion abnormalities. There is moderate left ventricular hypertrophy. Pericardium: A small pericardial effusion is present. Presence of epicardial fat layer. Aortic Valve: The aortic  valve is tricuspid. There is moderate calcification of the aortic valve. There is moderate thickening of the aortic valve. Venous: The inferior vena cava is dilated in size with greater than 50% respiratory variability, suggesting right atrial pressure of 8 mmHg. Gwyndolyn Kaufman MD Electronically signed by Gwyndolyn Kaufman MD Signature Date/Time: 07/28/2021/1:13:36 PM    Final (Updated)    Structural Heart Procedure  Result Date: 08/03/2021 See surgical note for result.  US Abdomen Limited RUQ (LIVER/GB)  Result Date: 07/21/2021 CLINICAL DATA:  Elevated LFTs. EXAM: ULTRASOUND ABDOMEN LIMITED RIGHT UPPER QUADRANT COMPARISON:  CT dated 05/13/2019. FINDINGS: Gallbladder: No gallstones or wall thickening visualized. No sonographic Murphy sign noted by sonographer. Common bile duct: Diameter: 5 mm Liver: The liver demonstrates a coarsened echotexture with normal echogenicity. There is slight irregularity of the liver contour. Clinical correlation is recommended to evaluate for early changes of cirrhosis. Portal vein is patent on color Doppler imaging with normal direction of blood flow towards the liver. Other: None. IMPRESSION: Coarsened liver echotexture, otherwise unremarkable right upper quadrant ultrasound. Electronically Signed   By: Anner Crete M.D.   On: 07/21/2021 23:52    Disposition   Pt is being discharged home today in good condition.  Follow-up Plans & Appointments    Follow-up Information     Tommie Raymond, NP. Go on 08/09/2021.   Specialty: Cardiology Why: @  12:30pm, Please arrive at least 10 minutes early. Contact information: [address] [place] [address] [phone]                Discharge Instructions     Amb Referral to Cardiac Rehabilitation   Complete by: As directed    To [place]   Diagnosis: Valve Replacement   Valve: Aortic Comment - TAVR   After initial evaluation and assessments completed: Virtual Based Care may be  provided alone or in conjunction with Phase 2 Cardiac Rehab based on patient barriers.: Yes   Call MD for:  difficulty breathing, headache or visual disturbances   Complete by: As directed    Call MD for:  extreme fatigue   Complete by: As directed    Call MD for:  hives   Complete by: As directed    Call MD for:  persistant dizziness or light-headedness   Complete by: As directed    Call MD for:  persistant nausea and vomiting   Complete by: As directed    Call MD for:  redness, tenderness, or signs of infection (pain, swelling, redness, odor or green/yellow discharge around incision site)   Complete by: As directed    Call MD for:  severe uncontrolled pain   Complete by: As directed    Call MD for:  temperature >100.4   Complete by: As directed    Diet - low sodium heart healthy   Complete by: As directed    Discharge instructions   Complete by: As directed    ACTIVITY AND EXERCISE  Daily activity and exercise are an important part of your recovery. People recover at different rates depending on their general health and type of valve procedure.  Most people recovering from TAVR feel better relatively quickly   No lifting, pushing, pulling more than 10 pounds (examples to avoid: groceries, vacuuming, gardening, golfing):             - For one week with a procedure through the groin.             - For six weeks for procedures through the chest wall or neck. NOTE: You will typically see one of our providers 7-14 days after your procedure to discuss [place] the above activities.      DRIVING  Do not drive until you are seen for follow up and cleared by a provider. Generally, we ask patient to not drive for 1 week after their procedure.  If you have been told by your doctor in the past that you may not drive, you must talk with him/her before you begin driving again.   DRESSING  Groin site: you may leave the clear dressing over the site for up to one week or until it  falls off.   HYGIENE  If you had a femoral (leg) procedure, you may take a shower when you return home. After the shower, pat the site dry. Do NOT use powder, oils or lotions in your groin area until the site has completely healed.  If you had a chest procedure, you may shower when you return home unless specifically instructed not to by your discharging practitioner.             - DO NOT scrub incision; pat dry with a towel.             - DO NOT apply any lotions, oils, powders to the incision.             -  No tub baths / swimming for at least 2 weeks.  If you notice any fevers, chills, increased pain, swelling, bleeding or pus, please contact your doctor.   ADDITIONAL INFORMATION  If you are going to have an upcoming dental procedure, please contact our office as you will require antibiotics ahead of time to prevent infection on your heart valve.    If you have any questions or concerns you can call the structural heart phone during normal business hours 8am-4pm. If you have an urgent need after hours or weekends please call [phone] to talk to the on call provider for general cardiology. If you have an emergency that requires immediate attention, please call 911.    After TAVR Checklist  Check  Test Description  Follow up appointment in 1-2 weeks  You will see our structural heart advanced practice provider. Your incision sites will be checked and you will be cleared to drive and resume all normal activities if you are doing well.    1 month echo and follow up  You will have an echo to check on your new heart valve and be seen back in the office by a structural heart advanced practice provider.  Follow up with your primary cardiologist You will need to be seen by your primary cardiologist in the following 3-6 months after your 1 month appointment in the valve clinic.   1 year echo and follow up You will have another echo to check on your heart valve after 1 year and be seen back in  the office by a structural heart advanced practice provider. This your last structural heart visit.  Bacterial endocarditis prophylaxis  You will have to take antibiotics for the rest of your life before all dental procedures (even teeth cleanings) to protect your heart valve. Antibiotics are also required before some surgeries. Please check with your cardiologist before scheduling any surgeries. Also, please make sure to tell us if you have a penicillin allergy as you will require an alternative antibiotic.   Face-to-face encounter (required for Medicare/Medicaid patients)   Complete by: As directed    I Kathyrn Drown certify that this patient is under my care and that I, or a nurse practitioner or physician's assistant working with me, had a face-to-face encounter that meets the physician face-to-face encounter requirements with this patient on 08/04/2021. The encounter with the patient was in whole, or in part for the following medical condition(s) which is the primary reason for home health care (List medical condition): s/p TAVR with significant deconditioning. Patient lives alone and needs help with conditioning and safe tranfers with ADL/IADLs   The encounter with the patient was in whole, or in part, for the following medical condition, which is the primary reason for home health care: s/p TAVR with significant deconditioning   I certify that, based on my findings, the following services are medically necessary home health services: Physical therapy   Reason for Medically Necessary Home Health Services:  Therapy- Personnel officer, Public librarian Therapy- Therapeutic Exercises to Increase Strength and Endurance Therapy- Home Adaptation to Facilitate Safety     My clinical findings support the need for the above services:  Shortness of breath with activity Unsafe ambulation due to balance issues     Further, I certify that my clinical findings support that this patient is  homebound due to:  Shortness of Breath with activity Unable to leave home safely without assistance     Home Health   Complete by:  As directed    To provide the following care/treatments: PT   If the dressing is still on your incision site when you go home, remove it on the third day after your surgery date. Remove dressing if it begins to fall off, or if it is dirty or damaged before the third day.   Complete by: As directed    Increase activity slowly   Complete by: As directed       Discharge Medications   Allergies as of 08/04/2021       Reactions   Codeine Nausea And Vomiting, Nausea Only   Cyclobenzaprine    Iodinated Contrast Media    Prednisone Other (See Comments)   Sores in mouth        Medication List     STOP taking these medications    aspirin 325 MG tablet Replaced by: aspirin 81 MG chewable tablet       TAKE these medications    acetaminophen 500 MG tablet Commonly known as: TYLENOL Take 500-1,000 mg by mouth every 6 (six) hours as needed for moderate pain.   albuterol 108 (90 Base) MCG/ACT inhaler Commonly known as: VENTOLIN HFA Inhale 2 puffs into the lungs every 4 (four) hours as needed for wheezing or shortness of breath (seasonal allergies).   ASPERCREME EX Apply 1 application topically daily as needed (knee pain).   aspirin 81 MG chewable tablet Chew 1 tablet (81 mg total) by mouth daily. Start taking on: August 05, 2021 Replaces: aspirin 325 MG tablet   Cartia XT 120 MG 24 hr capsule Generic drug: diltiazem Take 1 capsule (120 mg total) by mouth daily.   cetirizine 10 MG tablet Commonly known as: ZYRTEC Take 10 mg by mouth daily with breakfast.   docusate sodium 100 MG capsule Commonly known as: COLACE Take 100 mg by mouth daily with breakfast.   Emergen-C Vitamin C Pack Take 1 packet by mouth daily with lunch.   FISH OIL PO Take 1 capsule by mouth daily.   furosemide 20 MG tablet Commonly known as: LASIX Take 0.5  tablets (10 mg total) by mouth 2 (two) times daily. What changed:  how much to take when to take this   lidocaine 5 % Commonly known as: Lidoderm Place 1 patch onto the skin daily. Remove & Discard patch within 12 hours or as directed by MD   lisinopril 10 MG tablet Commonly known as: ZESTRIL Take 25 mg by mouth daily.   metoprolol tartrate 25 MG tablet Commonly known as: LOPRESSOR Take 1 tablet (25 mg total) by mouth 2 (two) times daily.   montelukast 10 MG tablet Commonly known as: SINGULAIR Take 10 mg by mouth daily after supper.   multivitamin with minerals Tabs tablet Take 1 tablet by mouth daily. Centrum 50 plus   omeprazole 20 MG capsule Commonly known as: PRILOSEC Take 20 mg by mouth daily before breakfast.   rosuvastatin 5 MG tablet Commonly known as: CRESTOR Take 5 mg by mouth every evening.   VITAMIN D3 PO Take 1 tablet by mouth daily.               Discharge Care Instructions  (From admission, onward)           Start     Ordered   08/04/21 0000  If the dressing is still on your incision site when you go home, remove it on the third day after your surgery date. Remove dressing if it begins to fall off, or if  it is dirty or damaged before the third day.        08/04/21 1448            Outstanding Labs/Studies   BMET   Duration of Discharge Encounter   Greater than 30 minutes including physician time.  Signed, Kathyrn Drown, NP 08/04/2021, 2:48 PM [phone]   ATTENDING ATTESTATION:  After conducting a review of all available clinical information with the care team, interviewing the patient, and performing a physical exam, I agree with the findings and plan described in this note.   GEN: No acute distress.   Cardiac: RRR, no murmurs, rubs, or gallops.  Respiratory: Clear to auscultation bilaterally. GI: Soft, nontender, non-distended  MS: No edema; No deformity. Neuro:  Nonfocal  Vasc:  +2 radial and femoral pulses  Patient  is doing well after uncomplicated TAVR.  She is still somewhat short of breath despite a very good clinical result from TAVR.  I believe this likely has to do with pulmonary disease versus deconditioning.  We will discharge the patient with a CO monitor as she had some short-lived atrial fibrillation during her last hospitalization.  Depending on these results we will make a determination about anticoagulation.  Additionally she will be seen in follow-up in 1 week and we will consider outpatient PFTs and pulmonology consult.  Lenna Sciara, MD Pager [phone]

## 2021-08-05 ENCOUNTER — Telehealth: Payer: Self-pay | Admitting: Student

## 2021-08-05 DIAGNOSIS — I480 Paroxysmal atrial fibrillation: Secondary | ICD-10-CM

## 2021-08-05 MED ORDER — DILTIAZEM HCL ER COATED BEADS 180 MG PO CP24: 180.0000 mg | ORAL_CAPSULE | Freq: Every day | ORAL | 2 refills | Status: DC

## 2021-08-05 MED ORDER — APIXABAN 2.5 MG PO TABS: 2.5000 mg | ORAL_TABLET | Freq: Two times a day (BID) | ORAL | 3 refills | Status: DC

## 2021-08-05 NOTE — Telephone Encounter (Signed)
° °  Received page from [facility] about abnormal EKG. Called and spoke with [facility] tech who reported patient had "first documented episode of atrial flutter or possible atrial tachycardia with variable block" around 4:40am this morning. Episode lasted at least 90 seconds. They called the patient who reported she had gotten up to use the restroom and noted some heart racing and feeling "winded" but symptoms resolved quickly. Reviewed strips with Dr. Lynnette Caffey which showed narrow complex tachycardia which is irregular and looks consistent with atrial fibrillation. Rates ranged from 127 to 160 bpm with average rate of 147 bpm. Dr. Roby Lofts recommended stopping Aspirin and starting Eliquis 2.5mg  twice daily as well as increasing Cardizem to 180mg  daily.  Called and spoke with patient and daughter. Patient reported having some periods of increased labored breathing with exertion that resolve pretty quickly with rest. Otherwise, no significant symptoms. Discussed Dr. recommendations as above and daughter voiced understanding. Patient is scheduled to see PCP later today and has close follow-up with Pollyann Kennedy, NP with the [facility], next week. Advised patient to let Georgie Chard know if she develops any symptoms prior to this. Will send this phone note to [place] so that she is aware. Daughter also requested a follow-up visit with her primary Cardiologist (Dr. Noreene Larsson) - I will send a message to our schedulers to help arrange this.  [place], PA-C 08/05/2021 9:05 AM

## 2021-08-05 NOTE — Telephone Encounter (Signed)
error 

## 2021-08-06 NOTE — Progress Notes (Signed)
[place]                                     Cardiology Office Note:    Date:  08/10/2021   ID:  Bjorn Pippin, DOB May 09, 1935, MRN [id number]  PCP:  Kristopher Glee., MD  [place] Cardiologist:  Peter Martinique, MD/ Dr. Ali Lowe, MD & Dr. Cyndia Bent, MD (TAVR) [facility] Electrophysiologist:  None   Referring MD: Kristopher Glee., MD   Chief Complaint  Patient presents with   Follow-up    1 week s/p TAVR    History of Present Illness:    Barbara Mcconnell is a 87 y.o. female with a hx of HTN, HLD, peripheral vascular disease with moderate carotid artery stenosis, hx of Takotsubo cardiomyopathy with LV normalization, recent pericardial effusion requiring pericardial drain 07/2021, and severe aortic stenosis who presents for 1 week follow up s/p TAVR 08/03/21.   Barbara Mcconnell is a very functional 87yo who lives alone in [place] [facility]. She has one daughter who lives locally and is very involved with her care. She was initially seen by Dr. Martinique after presenting to [facility] 05/13/2019 with chest pain. She had previously been hospitalized about 5 years prior and was told that she had Takotsubo cardiomyopathy with mild non-obstructive CAD which had been treated medically with normalization of LV function. She had been doing very well but developed sub-sternal chest pain with weakness and diaphoresis and presented to [facility] 04/2019 for further evaluation, found to have non-obstructive CAD per cardiac catheterization with severe LV dysfunction at 35-40% with wall motion abnormalities consistent with Takotsubo's cardiomyopathy. Echocardiogram 05/14/19 confirmed EF at 35-40% with moderate AS with a mean gradient at 21.5 mmHg, peak gradient at 44.7 mmHg, and AVA by VTI measuring 2.64 cm. Her medications were adjusted and she was seen in cardiology follow up several times in 2020 however was lost to follow up until admission 07/2021. It appears that she had a follow up  echocardiogram 09/2019 which showed normalized LV function to 60-65% with no wall motion abnormalities moderate to severe aortic valve stenosis with AVA at 0.85 cm, mean gradient measures 20.5 mmHg and peak gradient at 31.53mmHg.    On structural heart evaluation, she reported that she felt great until about 5 weeks prior to admission when she began to notice more exertional dyspnea. Activities which used to be easy for her became much harder due to SOB. She denied chest pain, palpitations, orthopnea, LE edema, dizziness, or syncope. She reported that she was participating in regular exercise with silver sneakers three times per week without an issue. More recently, as her symptoms progressed, she followed closely with her PCP along with a pulmonary team and and was started on low dose Lasix. She then saw her PCP 07/21/21 and was found to be tachypneic and was therefore sent to the ED for further assessment.    On hospital arrival a repeat echocardiogram showed normal LVEF with a moderate circumfrential pericardial effusion measuring 1.5 cm. There was moderate AS with a mean gradient at 22mmHg (was 20.87mmhg), AVA 1.38cm and DI 0.29. She underwent [facility] 07/23/21 which showed stable, mild to moderate CAD with hemodynamics consistent with pulmonary hypertension. MD unable to cross aortic valve during procedure. Due to moderate AS, she was initially felt to be a good PROGRESS trial candidate however with discrepancies between echocardiogram findings and inability  to cross AV, pre-TAVR CT imaging was obtained. These showed an AVA 1.01cm2, annular area measuring 464mm2, and large circumferential pericardial effusion. After further discussion, she opted to defer PROGRESS trail consideration and move forward with TAVR given progressive symptoms, LVH with small LV cavity and calcium score at 2093. Her hospital stay was further complicated by new onset atrial fibrillation with RVR. She was placed on diltiazem infusion with  relatively quick conversion to NSR. She ultimately underwent pericardiocentesis 07/28/21 and drain placement. This was discontinued 07/29/21 and the patient was discharged from the hospital 07/30/21.    Prior to d/c, she was evaluated by our multidisciplinary valve team with Dr. Cyndia Bent and felt to have symptomatic, stage D aortic stenosis with Class III symptoms and felt to be a suitable candidate for TAVR. Plan at that time was for hospital discharge and scheduled TAVR on 08/03/21.   She is now s/p successful TAVR with a 23 mm Edwards Sapien 3 Resilia via the TF approach on 08/03/21. Post operative echo showed LVEF at 65-70% with G1DD, mild MR, mild MS, trivial PVL in the right coronary ostium with mild MR with a mean gradient at 10.5mmHg. ECG with NSR and frequent PVCs however no high grade heart block. She was initially started on ASA 81mg  QD however this was changed to Eliquis 2.5mg  BID after discharge due to AF on ZIO monitor. PT/OT evaluation with recommendations for [facility] PT. This was ordered.   Today she presents with her daughter and reports that she has been doing very well hospital discharge. She has a HH aid to help with home tasks and PT will start with her tomorrow. She is able to walk around her home without getting SOB. She continues to walk with a cane for stability but feels more stable with ambulation. She looks great today. Groin sites with no evidence of bleeding or hematoma. After discharge, ZIO monitor alerted team for recurrent AF. She was started on Eliquis 2.5mg  BID along with diltiazem. She denies palpitations, chest pain, LE edema, dizziness, orthopnea, or syncope.   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 Ali Lowe & Dr. Cyndia Bent   Seasonal allergies    Takotsubo cardiomyopathy     Past Surgical History:  Procedure Laterality Date   COLONOSCOPY     EYE SURGERY Bilateral    cataracts removed   INTRAOPERATIVE TRANSTHORACIC ECHOCARDIOGRAM N/A 08/03/2021   Procedure: INTRAOPERATIVE TRANSTHORACIC ECHOCARDIOGRAM;  Surgeon: Early Osmond, MD;  Location: [place] [facility];  Service: Open Heart Surgery;  Laterality: N/A;   PERICARDIOCENTESIS N/A 07/28/2021   Procedure: PERICARDIOCENTESIS;  Surgeon: Early Osmond, MD;  Location: [place] [facility];  Service: Cardiovascular;  Laterality: N/A;   RIGHT/LEFT HEART CATH AND CORONARY ANGIOGRAPHY N/A 05/13/2019   Procedure: RIGHT/LEFT HEART CATH AND CORONARY ANGIOGRAPHY;  Surgeon: Martinique, Peter M, MD;  Location: [place] [facility];  Service: Cardiovascular;  Laterality: N/A;   RIGHT/LEFT HEART CATH AND CORONARY ANGIOGRAPHY N/A 07/23/2021   Procedure: RIGHT/LEFT HEART CATH AND CORONARY ANGIOGRAPHY;  Surgeon: Larae Grooms  S, MD;  Location: [place] [facility];  Service: Cardiovascular;  Laterality: N/A;   TONSILLECTOMY     TRANSCATHETER AORTIC VALVE REPLACEMENT, TRANSFEMORAL N/A 08/03/2021   Procedure: TRANSCATHETER AORTIC VALVE REPLACEMENT, TRANSFEMORAL;  Surgeon: Early Osmond, MD;  Location: [place] [facility];  Service: Open Heart Surgery;  Laterality: N/A;   UPPER GI ENDOSCOPY      Current Medications: Current Meds  Medication Sig   acetaminophen (TYLENOL) 500 MG tablet Take 500-1,000 mg by mouth every 6 (six) hours as needed for moderate pain.   albuterol (VENTOLIN HFA) 108 (90 Base) MCG/ACT inhaler Inhale 2 puffs into the lungs every 4 (four) hours as needed for wheezing or shortness of breath (seasonal allergies).    apixaban (ELIQUIS) 2.5 MG TABS tablet Take 1 tablet (2.5 mg total) by mouth 2 (two) times daily.   cetirizine (ZYRTEC) 10 MG  tablet Take 10 mg by mouth daily with breakfast.   Cholecalciferol (VITAMIN D3 PO) Take 1 tablet by mouth daily.   docusate sodium (COLACE) 100 MG capsule Take 100 mg by mouth daily with breakfast.   furosemide (LASIX) 20 MG tablet Take 0.5 tablets (10 mg total) by mouth 2 (two) times daily.   lisinopril (ZESTRIL) 10 MG tablet Take 25 mg by mouth daily.   montelukast (SINGULAIR) 10 MG tablet Take 10 mg by mouth daily after supper.   Multiple Vitamin (MULTIVITAMIN WITH MINERALS) TABS tablet Take 1 tablet by mouth daily. Centrum 50 plus   Multiple Vitamins-Minerals (EMERGEN-C VITAMIN C) PACK Take 1 packet by mouth daily with lunch.   Omega-3 Fatty Acids (FISH OIL PO) Take 1 capsule by mouth daily.    omeprazole (PRILOSEC) 20 MG capsule Take 20 mg by mouth daily before breakfast.   rosuvastatin (CRESTOR) 5 MG tablet Take 5 mg by mouth every evening.   Trolamine Salicylate (ASPERCREME EX) Apply 1 application topically daily as needed (knee pain).   [DISCONTINUED] diltiazem (CARDIZEM CD) 180 MG 24 hr capsule Take 1 capsule (180 mg total) by mouth daily.   [DISCONTINUED] metoprolol tartrate (LOPRESSOR) 25 MG tablet Take 1 tablet (25 mg total) by mouth 2 (two) times daily.     Allergies:   Codeine, Cyclobenzaprine, Iodinated contrast media, and Prednisone   Social History   Socioeconomic History   Marital status: Divorced    Spouse name: Not on file   Number of children: Not on file   Years of education: Not on file   Highest education level: Not on file  Occupational History   Not on file  Tobacco Use   Smoking status: Former    Packs/day: 1.00    Years: 20.00    Pack years: 20.00    Types: Cigarettes   Smokeless tobacco: Never   Tobacco comments:    Quit 30 yrs ago as of 07/2021 per daughter  Vaping Use   Vaping Use: Never used  Substance and Sexual Activity   Alcohol use: Yes    Comment: occasiona wiine   Drug use: No   Sexual activity: Not Currently    Birth  control/protection: Post-menopausal  Other Topics Concern   Not on file  Social History Narrative   Not on file   Social Determinants of Health   Financial Resource Strain: Not on file  Food Insecurity: Not on file  Transportation Needs: Not on file  Physical Activity: Not on file  Stress: Not on file  Social Connections: Not on file     Family History: The patient's family  history includes Hypertension in an other family member.  ROS:   Please see the history of present illness.    All other systems reviewed and are negative.  EKGs/Labs/Other Studies Reviewed:    The following studies were reviewed today:  TAVR OPERATIVE NOTE     Date of Procedure:                08/03/2021   Preoperative Diagnosis:      Severe Aortic Stenosis    Postoperative Diagnosis:    Same    Procedure:        Transcatheter Aortic Valve Replacement - Transfemoral Approach             Edwards Sapien 3 Resilia THV (size 12mm, model # 9755RLS, serial # # [id number], serial # [id number])              Co-Surgeons:                         Gaye Pollack, MD and Lenna Sciara, MD Anesthesiologist:                  Collins Scotland, MD   Echocardiographer:              Edmonia James, MD   Pre-operative Echo Findings: Severe aortic stenosis Normal left ventricular systolic function   Post-operative Echo Findings: No paravalvular leak Normal left ventricular systolic function _____________   Echo 08/04/21:    1. Left ventricular ejection fraction, by estimation, is 65 to 70%. The  left ventricle has normal function. The left ventricle has no regional  wall motion abnormalities. There is mild left ventricular hypertrophy of  the basal-septal segment. Left  ventricular diastolic parameters are consistent with Grade I diastolic  dysfunction (impaired relaxation). Elevated left atrial pressure.   2. Right ventricular systolic function is normal. The right ventricular  size is normal. Tricuspid regurgitation  signal is inadequate for assessing  PA pressure.   3. Left atrial size was mildly dilated.   4. The mitral valve is degenerative. Mild mitral valve regurgitation.  Mild mitral stenosis. The mean mitral valve gradient is 7.0 mmHg with  average heart rate of 93 bpm. Severe mitral annular calcification.   5. There is trivial perivalvular leak in the vicinity of the right  coronary ostium. The aortic valve has been repaired/replaced. Aortic valve  regurgitation is mild. There is a 23 mm Sapien prosthetic (TAVR) valve  present in the aortic position.  Procedure Date: 08/03/21. Aortic valve mean gradient measures 10.2 mmHg.  Aortic valve Vmax measures 2.22 m/s. Aortic valve acceleration time  measures 58 msec.   6. The inferior vena cava is dilated in size with <50% respiratory  variability, suggesting right atrial pressure of 15 mmHg.   Recent Labs: 07/22/2021: TSH 1.472 07/30/2021: ALT 22; B Natriuretic Peptide 194.4 08/04/2021: BUN 29; Creatinine, Ser 1.15; Hemoglobin 11.0; Magnesium 2.1; Platelets 387; Potassium 4.4; Sodium 133  Recent Lipid Panel No results found for: CHOL, TRIG, HDL, CHOLHDL, VLDL, LDLCALC, LDLDIRECT   Physical Exam:    VS:  BP 116/66    Pulse 83    Ht 5\' 1"  (1.549 m)    Wt 122 lb 3.2 oz (55.4 kg)    SpO2 98%    BMI 23.09 kg/m     Wt Readings from Last 3 Encounters:  08/09/21 122 lb 3.2 oz (55.4 kg)  08/04/21 127 lb 6.4 oz (57.8 kg)  07/30/21 123 lb  11.2 oz (56.1 kg)    General: Elderly, NAD Neck: Negative for carotid bruits. No JVD Lungs:Clear to ausculation bilaterally. Breathing is unlabored. Cardiovascular: RRR with S1 S2. Systolic murmur present with no rubs, gallops, or LV heave appreciated. Extremities: No edema.  Neuro: Alert and oriented. No focal deficits. No facial asymmetry. MAE spontaneously. Psych: Responds to questions appropriately with normal affect.    ASSESSMENT/PLAN:    Severe AS: s/p successful TAVR with a 23 mm Edwards Sapien 3 Resilia  via the TF approach on 08/03/21. Post operative echo with LVEF at 65-70% with G1DD, mild MR, mild MS, trivial PVL in the right coronary ostium with mild MR with a mean gradient at 10.70mmHg. ECG with NSR and frequent PVCs however no high grade heart block. She was initially started on ASA 81mg  QD however this was changed to Eliquis 2.5mg  BID after discharge due to AF on ZIO monitor. PT/OT evaluation with recommendations for [facility] PT. SBE discussed with Rx for Amoxicillin 2g one hour prior to dental procedures. Continue Eliquis monotherapy. Will get full read on ZIO and notify patient and daughter of AF burden. Plan 1 month follow up with myself with echocardiogram prior to visit.    Hx of recent pericardial effusion: On prior admission 06/2021, patient found to have large pericardial effusion on pre TAVR CT imaging. Underwent pericardiocentesis with 441ml bloody drainage. Fluid sent to cytology with no malignancy reported. Post TAVR echocardiogram with with no evidence of recurrent effusion.    Paroxysmal atrial fibrillation: Patient went into AF with RVR on 07/23/21 and was placed on IV diltiazem with quick conversion to NSR. Plan was to hold off on anticoagulation at that time until after TAVR. ZIO placed at discharge which showed recurrent episodes therefore ASA was stopped and she was started on Eliquis 2.5mg  BID (age >80yo; weight <60kg) along with Diltiazem 180mg . ZIO to be completed in 1 week. Will review for AF burden. May require medication adjustment.    Carotid artery disease: Pre TAVR head/neck CTA with significant carotid artery stenosis with plans for surveillance dopplers in the OP setting. Scheduled for VVS consult 09/03/21. Continue ASA, statin.    Chronic diastolic CHF: Appears euvolemic on exam. Continue Lasix 10mg  BID.    HTN: Stable, no adjustment needed.   Hyponatremia: Has chronic hyponatremia. Improved from last hospitalization with Na+ in the 125-130 range. Lexapro stopped at last d/c.  Last Na+ 133. Continue to monitor.    Severe anxiety: Lexapro stopped as above. Needs to follow up with PCP for alternative SSRI.    Physical deconditioning: Has HH aid for assistance. PT starts tomorrow. Feels improved with less SOB with activity and ambulation after surgery.    Medication Adjustments/Labs and Tests Ordered: Current medicines are reviewed at length with the patient today.  Concerns regarding medicines are outlined above.  No orders of the defined types were placed in this encounter.  Meds ordered this encounter  Medications   diltiazem (CARDIZEM CD) 180 MG 24 hr capsule    Sig: Take 1 capsule (180 mg total) by mouth daily.    Dispense:  90 capsule    Refill:  1   metoprolol tartrate (LOPRESSOR) 25 MG tablet    Sig: Take 1 tablet (25 mg total) by mouth 2 (two) times daily.    Dispense:  180 tablet    Refill:  1    Patient Instructions  Medication Instructions:   Your physician recommends that you continue on your current medications as directed. Please refer  to the Current Medication list given to you today.   *If you need a refill on your cardiac medications before your next appointment, please call your pharmacy*   Lab Work: [place]    If you have labs (blood work) drawn today and your tests are completely normal, you will receive your results only by: [place] (if you have MyChart) OR A paper copy in the mail If you have any lab test that is abnormal or we need to change your treatment, we will call you to review the results.   Testing/Procedures:  AS SCHEDULED     Follow-Up: At [facility], you and your health needs are our priority.  As part of our continuing mission to provide you with exceptional heart care, we have created designated Provider Care Teams.  These Care Teams include your primary Cardiologist (physician) and Advanced Practice Providers (APPs -  Physician Assistants and Nurse Practitioners) who all work together  to provide you with the care you need, when you need it.  We recommend signing up for the patient portal called "MyChart".  Sign up information is provided on this After Visit Summary.  MyChart is used to connect with patients for Virtual Visits (Telemedicine).  Patients are able to view lab/test results, encounter notes, upcoming appointments, etc.  Non-urgent messages can be sent to your provider as well.   To learn more about what you can do with MyChart, go to [email or web address].    Your next appointment:  AS SCHEDULED    Other Instructions    Signed, Kathyrn Drown, NP  08/10/2021 8:23 AM    [place]

## 2021-08-09 ENCOUNTER — Ambulatory Visit: Payer: Medicare HMO | Admitting: Cardiology

## 2021-08-09 ENCOUNTER — Other Ambulatory Visit: Payer: Self-pay

## 2021-08-09 ENCOUNTER — Encounter: Payer: Self-pay | Admitting: Cardiology

## 2021-08-09 VITALS — BP 116/66 | HR 83 | Ht 61.0 in | Wt 122.2 lb

## 2021-08-09 DIAGNOSIS — I480 Paroxysmal atrial fibrillation: Secondary | ICD-10-CM | POA: Diagnosis not present

## 2021-08-09 DIAGNOSIS — I2510 Atherosclerotic heart disease of native coronary artery without angina pectoris: Secondary | ICD-10-CM

## 2021-08-09 DIAGNOSIS — I350 Nonrheumatic aortic (valve) stenosis: Secondary | ICD-10-CM

## 2021-08-09 DIAGNOSIS — I3139 Other pericardial effusion (noninflammatory): Secondary | ICD-10-CM

## 2021-08-09 DIAGNOSIS — I5181 Takotsubo syndrome: Secondary | ICD-10-CM

## 2021-08-09 DIAGNOSIS — Z952 Presence of prosthetic heart valve: Secondary | ICD-10-CM | POA: Diagnosis not present

## 2021-08-09 DIAGNOSIS — I6523 Occlusion and stenosis of bilateral carotid arteries: Secondary | ICD-10-CM

## 2021-08-09 DIAGNOSIS — I10 Essential (primary) hypertension: Secondary | ICD-10-CM

## 2021-08-09 LAB — POCT ACTIVATED CLOTTING TIME
Activated Clotting Time: 157 seconds
Activated Clotting Time: 144 seconds
Activated Clotting Time: 286 seconds

## 2021-08-09 MED ORDER — METOPROLOL TARTRATE 25 MG PO TABS: 25.0000 mg | ORAL_TABLET | Freq: Two times a day (BID) | ORAL | 1 refills | Status: DC

## 2021-08-09 MED ORDER — DILTIAZEM HCL ER COATED BEADS 180 MG PO CP24: 180.0000 mg | ORAL_CAPSULE | Freq: Every day | ORAL | 1 refills | Status: DC

## 2021-08-09 NOTE — Patient Instructions (Signed)
Medication Instructions:   Your physician recommends that you continue on your current medications as directed. Please refer to the Current Medication list given to you today.   *If you need a refill on your cardiac medications before your next appointment, please call your pharmacy*   Lab Work: [place]    If you have labs (blood work) drawn today and your tests are completely normal, you will receive your results only by: [place] (if you have MyChart) OR A paper copy in the mail If you have any lab test that is abnormal or we need to change your treatment, we will call you to review the results.   Testing/Procedures:  AS SCHEDULED     Follow-Up: At [facility], you and your health needs are our priority.  As part of our continuing mission to provide you with exceptional heart care, we have created designated Provider Care Teams.  These Care Teams include your primary Cardiologist (physician) and Advanced Practice Providers (APPs -  Physician Assistants and Nurse Practitioners) who all work together to provide you with the care you need, when you need it.  We recommend signing up for the patient portal called "MyChart".  Sign up information is provided on this After Visit Summary.  MyChart is used to connect with patients for Virtual Visits (Telemedicine).  Patients are able to view lab/test results, encounter notes, upcoming appointments, etc.  Non-urgent messages can be sent to your provider as well.   To learn more about what you can do with MyChart, go to [email or web address].    Your next appointment:  AS SCHEDULED    Other Instructions

## 2021-08-10 ENCOUNTER — Encounter: Payer: Self-pay | Admitting: Cardiology

## 2021-08-10 ENCOUNTER — Telehealth (HOSPITAL_COMMUNITY): Payer: Self-pay

## 2021-08-10 ENCOUNTER — Other Ambulatory Visit: Payer: Self-pay | Admitting: Cardiology

## 2021-08-10 MED ORDER — AMOXICILLIN 500 MG PO CAPS
2000.0000 mg | ORAL_CAPSULE | ORAL | 12 refills | Status: DC
Start: 2021-08-10 — End: 2021-09-01

## 2021-08-10 NOTE — Telephone Encounter (Signed)
Per phase I cardiac rehab, fax cardiac rehab referral to [place] [facility]. 

## 2021-08-11 ENCOUNTER — Telehealth: Payer: Self-pay

## 2021-08-11 NOTE — Telephone Encounter (Signed)
° °  Cardiac Monitor Alert  Date of alert:  08/11/2021   Patient Name: Barbara Mcconnell  DOB: 27-Dec-1934  MRN: [id number]   [facility] Cardiologist: Peter Swaziland, MD  [facility] EP:  None    Monitor Information: Long Term Monitor-Live Telemetry [ZioAT]  Reason:  Presence of prosthetic heart valave Ordering provider:  Clyde Canterbury   Alert Pause(s) - Longest:  6.1 seconds This is the 1st alert for this rhythm.   Next Cardiology Appointment   Date:  09/06/2021  Provider:  TAVR TEAM  The patient was contacted today.  She is asymptomatic.  Pt states she was sleeping. Arrhythmia, symptoms and history reviewed with Dr Eldridge Dace, DOD.   Plan:  Continue monitoring    Alois Cliche, RN  08/11/2021 10:26 AM

## 2021-08-16 ENCOUNTER — Ambulatory Visit: Payer: Medicare HMO | Admitting: Cardiology

## 2021-08-31 ENCOUNTER — Other Ambulatory Visit: Payer: Self-pay

## 2021-08-31 DIAGNOSIS — I6529 Occlusion and stenosis of unspecified carotid artery: Secondary | ICD-10-CM

## 2021-09-02 NOTE — Progress Notes (Signed)
Office Note     CC: Bilateral internal carotid artery stenosis right greater than left Requesting Provider:  Kristopher Glee., MD  HPI: Barbara Mcconnell is a 87 y.o. ([birth year]) female presenting at the request of .Kristopher Glee., MD for bilateral internal carotid artery stenosis, right greater than left.  Patient has history of of HTN, HLD, peripheral vascular disease with moderate carotid artery stenosis, hx of Takotsubo cardiomyopathy with LV normalization, recent pericardial effusion requiring pericardial drain 07/2021, and severe aortic stenosis s/p TAVR 08/03/21.    On exam, Barbara Mcconnell was accompanied by her daughter.  She lives independently in [place].  Originally from [place], she moved to [place] to be closer to her daughter, son-in-law, 2 grandchildren.  1 grandchild is in high school, the other is studying Buyer, retail at [employer].  Barbara Mcconnell has been doing well status post TAVR.  Her energy level is improving however she wants to be more active.  Presents today after CT imaging demonstrated concern for critical stenosis of the right internal carotid artery.  She denies history of stroke, TIA, amaurosis.   The pt is  on a statin for cholesterol management.  The pt is - on a daily aspirin.   Other AC:  Eliquis The pt is  on medication for hypertension.   The pt is not diabetic.  Tobacco hx:  former  Past Medical History:  Diagnosis Date   Allergies    Anxiety    panic attacks   Aortic stenosis    Arthritis    CAD (coronary artery disease)    non obst CAD   GERD (gastroesophageal reflux disease)    Heart murmur    Hemorrhoids    History of mammogram    Hyperlipemia    Hypertension    PAF (paroxysmal atrial fibrillation) (HCC)    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 Ali Lowe & Dr. Cyndia Bent   Seasonal allergies    Takotsubo cardiomyopathy     Past Surgical History:  Procedure  Laterality Date   COLONOSCOPY     EYE SURGERY Bilateral    cataracts removed   INTRAOPERATIVE TRANSTHORACIC ECHOCARDIOGRAM N/A 08/03/2021   Procedure: INTRAOPERATIVE TRANSTHORACIC ECHOCARDIOGRAM;  Surgeon: Early Osmond, MD;  Location: [facility];  Service: Open Heart Surgery;  Laterality: N/A;   PERICARDIOCENTESIS N/A 07/28/2021   Procedure: PERICARDIOCENTESIS;  Surgeon: Early Osmond, MD;  Location: [facility];  Service: Cardiovascular;  Laterality: N/A;   RIGHT/LEFT HEART CATH AND CORONARY ANGIOGRAPHY N/A 05/13/2019   Procedure: RIGHT/LEFT HEART CATH AND CORONARY ANGIOGRAPHY;  Surgeon: Martinique, Peter M, MD;  Location: [facility];  Service: Cardiovascular;  Laterality: N/A;   RIGHT/LEFT HEART CATH AND CORONARY ANGIOGRAPHY N/A 07/23/2021   Procedure: RIGHT/LEFT HEART CATH AND CORONARY ANGIOGRAPHY;  Surgeon: Jettie Booze, MD;  Location: [facility];  Service: Cardiovascular;  Laterality: N/A;   TONSILLECTOMY     TRANSCATHETER AORTIC VALVE REPLACEMENT, TRANSFEMORAL N/A 08/03/2021   Procedure: TRANSCATHETER AORTIC VALVE REPLACEMENT, TRANSFEMORAL;  Surgeon: Early Osmond, MD;  Location: [facility];  Service: Open Heart Surgery;  Laterality: N/A;   UPPER GI ENDOSCOPY      Social History   Socioeconomic History   Marital status: Divorced    Spouse name: Not on file   Number of children: Not on file   Years of education: Not on  file   Highest education level: Not on file  Occupational History   Not on file  Tobacco Use   Smoking status: Former    Packs/day: 1.00    Years: 20.00    Pack years: 20.00    Types: Cigarettes   Smokeless tobacco: Never   Tobacco comments:    Quit 30 yrs ago as of 07/2021 per daughter  Vaping Use   Vaping Use: Never used  Substance and Sexual Activity   Alcohol use: Not Currently    Comment: occasiona wiine   Drug use: Never   Sexual activity: Not Currently    Birth control/protection: Post-menopausal   Other Topics Concern   Not on file  Social History Narrative   Tobacco use, amount per day now: None   Past tobacco use, amount per day: Up to 1 pack   How many years did you use tobacco: 20 years.   Alcohol use (drinks per week): None.   Diet: 3 meals daily/mostly cooked at home.   Do you drink/eat things with caffeine: periodic diet coke.   Marital status:  Divorced.                                What year were you married? 1968   Do you live in a house, apartment, assisted living, condo, trailer, etc.? One Level Townhome   Is it one or more stories? One Level   How many persons live in your home? One   Do you have pets in your home?( please list) No   Highest Level of education completed? High School   Current or past profession: Programmer, systems.    Do you exercise?   Yes when able.                               Type and how often? Silver Sneakers 2-3 times weekly.   Do you have a living will? Yes   Do you have a DNR form?        No                           If not, do you want to discuss one? Yes   Do you have signed POA/HPOA forms?   Yes                     If so, please bring to you appointment      Do you have any difficulty bathing or dressing yourself? Yes   Do you have any difficulty preparing food or eating? Yes   Do you have any difficulty managing your medications? Yes   Do you have any difficulty managing your finances? No   Do you have any difficulty affording your medications? No   Social Determinants of Radio broadcast assistant Strain: Not on file  Food Insecurity: Not on file  Transportation Needs: Not on file  Physical Activity: Not on file  Stress: Not on file  Social Connections: Not on file  Intimate Partner Violence: Not on file    Family History  Problem Relation Age of Onset   Cancer Sister    Lung cancer Sister    Alcohol abuse Sister    Melanoma Brother    Brain cancer Brother    Heart failure Brother    Diabetes Brother  Cancer Brother    Down syndrome Brother    Heart failure Brother    Endometriosis Daughter    Hypertension Other     Current Outpatient Medications  Medication Sig Dispense Refill   acetaminophen (TYLENOL) 500 MG tablet Take 500-1,000 mg by mouth every 6 (six) hours as needed for moderate pain.     albuterol (VENTOLIN HFA) 108 (90 Base) MCG/ACT inhaler Inhale 2 puffs into the lungs every 4 (four) hours as needed for wheezing or shortness of breath (seasonal allergies).      apixaban (ELIQUIS) 2.5 MG TABS tablet Take 1 tablet (2.5 mg total) by mouth 2 (two) times daily. 180 tablet 3   cetirizine (ZYRTEC) 10 MG tablet Take 10 mg by mouth daily with breakfast.     Cholecalciferol (VITAMIN D3 PO) Take 1 tablet by mouth daily.     diltiazem (CARDIZEM CD) 180 MG 24 hr capsule Take 1 capsule (180 mg total) by mouth daily. 90 capsule 1   docusate sodium (COLACE) 100 MG capsule Take 100 mg by mouth daily with breakfast.     furosemide (LASIX) 20 MG tablet Take 0.5 tablets (10 mg total) by mouth 2 (two) times daily. 120 tablet 3   lisinopril (ZESTRIL) 10 MG tablet Take 25 mg by mouth daily.     metoprolol tartrate (LOPRESSOR) 25 MG tablet Take 1 tablet (25 mg total) by mouth 2 (two) times daily. 180 tablet 1   montelukast (SINGULAIR) 10 MG tablet Take 10 mg by mouth daily after supper.     Multiple Vitamin (MULTIVITAMIN WITH MINERALS) TABS tablet Take 1 tablet by mouth daily. Centrum 50 plus     Multiple Vitamins-Minerals (EMERGEN-C VITAMIN C) PACK Take 1 packet by mouth daily with lunch.     Omega-3 Fatty Acids (FISH OIL PO) Take 1 capsule by mouth daily.      omeprazole (PRILOSEC) 20 MG capsule Take 20 mg by mouth daily before breakfast.     Trolamine Salicylate (ASPERCREME EX) Apply 1 application topically daily as needed (knee pain).     No current facility-administered medications for this visit.    Allergies  Allergen Reactions   Codeine Nausea And Vomiting and Nausea Only    Cyclobenzaprine    Iodinated Contrast Media    Prednisone Other (See Comments)    Sores in mouth     REVIEW OF SYSTEMS:   [X]  denotes positive finding, [ ]  denotes negative finding Cardiac  Comments:  Chest pain or chest pressure:    Shortness of breath upon exertion:    Short of breath when lying flat:    Irregular heart rhythm:        Vascular    Pain in calf, thigh, or hip brought on by ambulation:    Pain in feet at night that wakes you up from your sleep:     Blood clot in your veins:    Leg swelling:         Pulmonary    Oxygen at home:    Productive cough:     Wheezing:         Neurologic    Sudden weakness in arms or legs:     Sudden numbness in arms or legs:     Sudden onset of difficulty speaking or slurred speech:    Temporary loss of vision in one eye:     Problems with dizziness:         Gastrointestinal    Blood in stool:  Vomited blood:         Genitourinary    Burning when urinating:     Blood in urine:        Psychiatric    Major depression:         Hematologic    Bleeding problems:    Problems with blood clotting too easily:        Skin    Rashes or ulcers:        Constitutional    Fever or chills:      PHYSICAL EXAMINATION:  There were no vitals filed for this visit.  General:  WDWN in NAD; vital signs documented above Gait: Not observed HENT: WNL, normocephalic Pulmonary: normal non-labored breathing , without wheezing Cardiac: regular HR Abdomen: soft, NT, no masses Skin: without rashes Vascular Exam/Pulses:  Right Left  Radial 2+ (normal) 2+ (normal)  Ulnar 2+ (normal) 2+ (normal)  Femoral    Popliteal    DP 2+ (normal) 2+ (normal)  PT 2+ (normal) 2+ (normal)   Extremities: without ischemic changes, without Gangrene , without cellulitis; without open wounds;  Musculoskeletal: no muscle wasting or atrophy  Neurologic: A&O X 3;  No focal weakness or paresthesias are detected Psychiatric:  The pt has Normal  affect.   Non-Invasive Vascular Imaging:   Summary:  Right Carotid: Velocities in the right ICA are consistent with a 40-59%                 stenosis.   Left Carotid: Velocities in the left ICA are consistent with a 1-39%  stenosis.   CTA IMPRESSION: No evidence of thoracoabdominal aortic aneurysm or dissection.   Peripheral vascular disease with greater than 90% stenosis of the proximal right internal carotid artery. Plaque morphology appears irregular and may be better assessed with dedicated carotid artery Doppler sonography. 50% stenosis of the a dominant left vertebral artery at its origin. 50-75% stenosis of the inferior mesenteric artery at its origin, though this is of questionable clinical significance given patency of the celiac axis and superior mesenteric artery.  ASSESSMENT/PLAN: Barbara Mcconnell is a 87 y.o. female presenting with asymptomatic bilateral ICA stenosis, right greater than left.  The proximal right internal carotid artery was noted to have greater than 90% stenosis on the CT angio of the chest.  I independently reviewed the CT and appreciated a larger flow lumen than described.  I do not think this stenosis is greater than 80%.  Patient underwent bilateral carotid duplex ultrasonography today, demonstrating velocities consistent with moderate internal carotid artery stenosis.  I had a long conversation with Barbara Mcconnell regarding the above.  Being that there is a discrepancy with imaging modalities, she would be best served with CT angio of the head neck in an effort to further define the percent stenosis.  This would also allow for preoperative planning, should the stenosis be greater than 80%.  I will call her with the results of the study, and will schedule an appointment pending the results.  I will also call her daughter Temperance Kundrat.  In the interim, we discussed the signs and symptoms of stroke including TIA and amaurosis.  I asked her to seek immediate medical  attention should any of these occur. She was asked to continue her current medication regimen.   Broadus John, MD [facility] [phone]

## 2021-09-03 ENCOUNTER — Ambulatory Visit: Payer: Medicare HMO | Admitting: Vascular Surgery

## 2021-09-03 ENCOUNTER — Encounter: Payer: Self-pay | Admitting: Vascular Surgery

## 2021-09-03 ENCOUNTER — Ambulatory Visit (HOSPITAL_COMMUNITY)
Admission: RE | Admit: 2021-09-03 | Discharge: 2021-09-03 | Disposition: A | Discharge status: Home / Self Care | Payer: Medicare HMO | Source: Ambulatory Visit | Attending: Vascular Surgery | Admitting: Vascular Surgery

## 2021-09-03 ENCOUNTER — Other Ambulatory Visit: Payer: Self-pay

## 2021-09-03 VITALS — BP 144/68 | HR 65 | Temp 98.0°F | Resp 14 | Ht 61.0 in | Wt 119.0 lb

## 2021-09-03 DIAGNOSIS — I6523 Occlusion and stenosis of bilateral carotid arteries: Secondary | ICD-10-CM

## 2021-09-03 DIAGNOSIS — I6529 Occlusion and stenosis of unspecified carotid artery: Secondary | ICD-10-CM | POA: Diagnosis present

## 2021-09-06 ENCOUNTER — Ambulatory Visit: Payer: Medicare HMO

## 2021-09-06 ENCOUNTER — Other Ambulatory Visit (HOSPITAL_COMMUNITY): Payer: Medicare HMO

## 2021-09-07 ENCOUNTER — Other Ambulatory Visit: Payer: Self-pay

## 2021-09-07 DIAGNOSIS — I6523 Occlusion and stenosis of bilateral carotid arteries: Secondary | ICD-10-CM

## 2021-09-07 DIAGNOSIS — I6529 Occlusion and stenosis of unspecified carotid artery: Secondary | ICD-10-CM

## 2021-09-08 NOTE — Progress Notes (Addendum)
[place]                                     Cardiology Office Note:    Date:  09/13/2021   ID:  Barbara Mcconnell, DOB 06/04/1935, MRN [id number]  PCP:  Kristopher Glee., MD  [facility] Cardiologist:  Peter Martinique, MD/ Dr. Ali Lowe, MD & Dr. Cyndia Bent, MD (TAVR) [facility] Electrophysiologist:  None   Referring MD: Kristopher Glee., MD   1 month s/p TAVR   History of Present Illness:    Barbara Mcconnell is a 87 y.o. female with a hx of HTN, HLD, PVD with moderate carotid artery stenosis, hx of Takotsubo cardiomyopathy with LV normalization, recent pericardial effusion requiring pericardial drain 07/2021, and severe aortic stenosis s/p TAVR (08/03/21) who presents to clinic for follow up  Barbara Mcconnell is a very functional 87yo who lives alone in [place]. She has one daughter who lives locally and is very involved with her care. She was initially seen by Dr. Martinique after presenting to [facility] 05/13/2019 with chest pain. She had previously been hospitalized about 5 years prior and was told that she had Takotsubo cardiomyopathy with mild non-obstructive CAD which had been treated medically with normalization of LV function. She had been doing very well but developed sub-sternal chest pain with weakness and diaphoresis and presented to [facility] 04/2019 for further evaluation, found to have non-obstructive CAD per cardiac catheterization with severe LV dysfunction at 35-40% with wall motion abnormalities consistent with Takotsubo's cardiomyopathy. Echocardiogram 05/14/19 confirmed EF at 35-40% with moderate AS with a mean gradient at 21.5 mmHg, peak gradient at 44.7 mmHg, and AVA by VTI measuring 2.64 cm. Her medications were adjusted and she was seen in cardiology follow up several times in 2020 however was lost to follow up until admission 07/2021. It appears that she had a follow up echocardiogram 09/2019 which showed normalized LV function to 60-65% with no  wall motion abnormalities moderate to severe aortic valve stenosis with AVA at 0.85 cm, mean gradient measures 20.5 mmHg and peak gradient at 31.39mmHg.    She was recently admitted to [facility] 07/21/21-07/30/21 for acute dyspnea. Echo showed normal LVEF with a moderate circumfrential pericardial effusion measuring 1.5 cm. There was moderate AS with a mean gradient at 21mmHg (was 20.38mmhg), AVA 1.38cm and DI 0.29. She underwent [facility] 07/23/21 which showed stable, mild to moderate CAD with hemodynamics consistent with pulmonary hypertension. Due to moderate AS, she was initially felt to be a good PROGRESS trial candidate however with discrepancies between echocardiogram findings and inability to cross AV, pre-TAVR CT imaging was obtained. These showed an AVA 1.01cm2, annular area measuring 426mm2, and large circumferential pericardial effusion. After further discussion, she opted to defer PROGRESS trail consideration and move forward with TAVR given progressive symptoms, LVH with small LV cavity and calcium score at 2093. Her hospital stay was further complicated by new onset atrial fibrillation with RVR. She was placed on diltiazem infusion with relatively quick conversion to NSR. She ultimately underwent pericardiocentesis 07/28/21 and drain placement. This was discontinued 07/29/21 and the patient was discharged from the hospital 07/30/21. During her admission, she was evaluated by our multidisciplinary valve team and plans were made for TAVR on 08/03/21.   She underwent successful TAVR with a 23 mm Edwards Sapien 3 Resilia via the TF approach on 08/03/21. Post operative echo  showed LVEF at 65-70% with G1DD, mild MR, mild MS, trivial PVL in the right coronary ostium with mild MR with a mean gradient at 10.53mmHg. ECG with NSR and frequent PVCs however no high grade heart block. She was initially started on ASA 81mg  QD however this was changed to Eliquis 2.5mg  BID after discharge due to AF on ZIO monitor. PT/OT evaluation with  recommendations for [facility] PT. This was ordered.   She was seen for post hospital follow up and was doing great. Today she presents to clinic for follow up. Here with her daughter. Doing well and trying to stay active but has more fatigue/weakness than she would like. Gets short of breath with moderate activity. Wants to be more active but has lethargy. But overall able to do mostly what she wants with a dramatic improvement in symptoms since admission. No LE edema, orthopnea or PND. No dizziness or syncope. No palpitations .  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 Ali Lowe & Dr. Cyndia Bent   Seasonal allergies    Takotsubo cardiomyopathy     Past Surgical History:  Procedure Laterality Date   COLONOSCOPY     EYE SURGERY Bilateral    cataracts removed   INTRAOPERATIVE TRANSTHORACIC ECHOCARDIOGRAM N/A 08/03/2021   Procedure: INTRAOPERATIVE TRANSTHORACIC ECHOCARDIOGRAM;  Surgeon: Early Osmond, MD;  Location: [place] [facility];  Service: Open Heart Surgery;  Laterality: N/A;   PERICARDIOCENTESIS N/A 07/28/2021   Procedure: PERICARDIOCENTESIS;  Surgeon: Early Osmond, MD;  Location: [place] [facility];  Service: Cardiovascular;  Laterality: N/A;   RIGHT/LEFT HEART CATH AND CORONARY ANGIOGRAPHY N/A 05/13/2019   Procedure: RIGHT/LEFT HEART CATH AND CORONARY ANGIOGRAPHY;  Surgeon: Martinique, Peter M, MD;  Location: [place] [facility];  Service: Cardiovascular;  Laterality: N/A;   RIGHT/LEFT HEART CATH AND CORONARY ANGIOGRAPHY N/A 07/23/2021    Procedure: RIGHT/LEFT HEART CATH AND CORONARY ANGIOGRAPHY;  Surgeon: Jettie Booze, MD;  Location: [place] [facility];  Service: Cardiovascular;  Laterality: N/A;   TONSILLECTOMY     TRANSCATHETER AORTIC VALVE REPLACEMENT, TRANSFEMORAL N/A 08/03/2021   Procedure: TRANSCATHETER AORTIC VALVE REPLACEMENT, TRANSFEMORAL;  Surgeon: Early Osmond, MD;  Location: [place] CV [facility];  Service: Open Heart Surgery;  Laterality: N/A;   UPPER GI ENDOSCOPY      Current Medications: Current Meds  Medication Sig   acetaminophen (TYLENOL) 500 MG tablet Take 500-1,000 mg by mouth every 6 (six) hours as needed for moderate pain.   albuterol (VENTOLIN HFA) 108 (90 Base) MCG/ACT inhaler Inhale 2 puffs into the lungs every 4 (four) hours as needed for wheezing or shortness of breath (seasonal allergies).    apixaban (ELIQUIS) 2.5 MG TABS tablet Take 1 tablet (2.5 mg total) by mouth 2 (two) times daily.   cetirizine (ZYRTEC) 10 MG tablet Take 10 mg by mouth daily with breakfast.   Cholecalciferol (VITAMIN D3 PO) Take 1 tablet by mouth  daily.   diltiazem (CARDIZEM CD) 180 MG 24 hr capsule Take 180 mg by mouth as needed (Elevated heart rate).   docusate sodium (COLACE) 100 MG capsule Take 100 mg by mouth daily with breakfast.   furosemide (LASIX) 20 MG tablet Take 0.5 tablets (10 mg total) by mouth 2 (two) times daily.   lisinopril (ZESTRIL) 10 MG tablet Take 25 mg by mouth daily.   melatonin 5 MG TABS Take 5 mg by mouth at bedtime.   metoprolol tartrate (LOPRESSOR) 25 MG tablet Take 1 tablet (25 mg total) by mouth 2 (two) times daily.   montelukast (SINGULAIR) 10 MG tablet Take 10 mg by mouth daily after supper.   Multiple Vitamin (MULTIVITAMIN WITH MINERALS) TABS tablet Take 1 tablet by mouth daily. Centrum 50 plus   Multiple Vitamins-Minerals (EMERGEN-C VITAMIN C) PACK Take 1 packet by mouth daily with lunch.   Omega-3 Fatty Acids (FISH OIL PO) Take 1 capsule by mouth daily.    omeprazole (PRILOSEC)  20 MG capsule Take 20 mg by mouth daily before breakfast.   Trolamine Salicylate (ASPERCREME EX) Apply 1 application topically daily as needed (knee pain).   [DISCONTINUED] diltiazem (CARDIZEM CD) 180 MG 24 hr capsule Take 1 capsule (180 mg total) by mouth daily.     Allergies:   Codeine, Cyclobenzaprine, Iodinated contrast media, and Prednisone   Social History   Socioeconomic History   Marital status: Divorced    Spouse name: Not on file   Number of children: Not on file   Years of education: Not on file   Highest education level: Not on file  Occupational History   Not on file  Tobacco Use   Smoking status: Former    Packs/day: 1.00    Years: 20.00    Pack years: 20.00    Types: Cigarettes   Smokeless tobacco: Never   Tobacco comments:    Quit 30 yrs ago as of 07/2021 per daughter  Vaping Use   Vaping Use: Never used  Substance and Sexual Activity   Alcohol use: Not Currently    Comment: occasiona wiine   Drug use: Never   Sexual activity: Not Currently    Birth control/protection: Post-menopausal  Other Topics Concern   Not on file  Social History Narrative   Tobacco use, amount per day now: None   Past tobacco use, amount per day: Up to 1 pack   How many years did you use tobacco: 20 years.   Alcohol use (drinks per week): None.   Diet: 3 meals daily/mostly cooked at home.   Do you drink/eat things with caffeine: periodic diet coke.   Marital status:  Divorced.                                What year were you married? 1968   Do you live in a house, apartment, assisted living, condo, trailer, etc.? One Level Townhome   Is it one or more stories? One Level   How many persons live in your home? One   Do you have pets in your home?( please list) No   Highest Level of education completed? High School   Current or past profession: Programmer, systems.    Do you exercise?   Yes when able.  Type and how often? Silver Sneakers 2-3  times weekly.   Do you have a living will? Yes   Do you have a DNR form?        No                           If not, do you want to discuss one? Yes   Do you have signed POA/HPOA forms?   Yes                     If so, please bring to you appointment      Do you have any difficulty bathing or dressing yourself? Yes   Do you have any difficulty preparing food or eating? Yes   Do you have any difficulty managing your medications? Yes   Do you have any difficulty managing your finances? No   Do you have any difficulty affording your medications? No   Social Determinants of Radio broadcast assistant Strain: Not on file  Food Insecurity: Not on file  Transportation Needs: Not on file  Physical Activity: Not on file  Stress: Not on file  Social Connections: Not on file     Family History: The patient's family history includes Alcohol abuse in her sister; Brain cancer in her brother; Cancer in her brother and sister; Diabetes in her brother; Down syndrome in her brother; Endometriosis in her daughter; Heart failure in her brother and brother; Hypertension in an other family member; Lung cancer in her sister; Melanoma in her brother.  ROS:   Please see the history of present illness.    All other systems reviewed and are negative.  EKGs/Labs/Other Studies Reviewed:    The following studies were reviewed today:  TAVR OPERATIVE NOTE     Date of Procedure:                08/03/2021   Preoperative Diagnosis:      Severe Aortic Stenosis    Postoperative Diagnosis:    Same    Procedure:        Transcatheter Aortic Valve Replacement - Transfemoral Approach             Edwards Sapien 3 Resilia THV (size 24mm, model # 9755RLS, serial # # [id number], serial # [id number])              Co-Surgeons:                         Gaye Pollack, MD and Lenna Sciara, MD Anesthesiologist:                  Collins Scotland, MD   Echocardiographer:              Edmonia James, MD   Pre-operative Echo  Findings: Severe aortic stenosis Normal left ventricular systolic function   Post-operative Echo Findings: No paravalvular leak Normal left ventricular systolic function _____________   Echo 08/04/21:    1. Left ventricular ejection fraction, by estimation, is 65 to 70%. The  left ventricle has normal function. The left ventricle has no regional  wall motion abnormalities. There is mild left ventricular hypertrophy of  the basal-septal segment. Left  ventricular diastolic parameters are consistent with Grade I diastolic  dysfunction (impaired relaxation). Elevated left atrial pressure.   2. Right ventricular systolic function is normal. The right ventricular  size is normal. Tricuspid regurgitation signal  is inadequate for assessing  PA pressure.   3. Left atrial size was mildly dilated.   4. The mitral valve is degenerative. Mild mitral valve regurgitation.  Mild mitral stenosis. The mean mitral valve gradient is 7.0 mmHg with  average heart rate of 93 bpm. Severe mitral annular calcification.   5. There is trivial perivalvular leak in the vicinity of the right  coronary ostium. The aortic valve has been repaired/replaced. Aortic valve  regurgitation is mild. There is a 23 mm Sapien prosthetic (TAVR) valve  present in the aortic position.  Procedure Date: 08/03/21. Aortic valve mean gradient measures 10.2 mmHg.  Aortic valve Vmax measures 2.22 m/s. Aortic valve acceleration time  measures 58 msec.   6. The inferior vena cava is dilated in size with <50% respiratory  variability, suggesting right atrial pressure of 15 mmHg.   ______________________  Echo 09/10/21 IMPRESSIONS  1. 23 mm S3 is present in the aortic position. There is abnormal color  flow in the region of the prosthesis, but this is forward flow based on  color doppler. There is no apparenent regurgitation or paravalvular leak.  Possibly, this represents flow  between the stent frame and aortic root. There is also  a small color  signal below the annulus in the 8-10 o'clock position. Cannot exclude a  very small iatrogenic VSD. Vmax 2.1 m/s, MG 9.5 mmHG, EOA 2.42 cm2.  Hemodynamics are within limits. Would  consider TEE for characterization if clinically indicated. The aortic  valve has been repaired/replaced. Aortic valve regurgitation is not  visualized. There is a 23 mm Sapien prosthetic (TAVR) valve present in the  aortic position. Procedure Date:  08/03/2021.   2. Left ventricular ejection fraction, by estimation, is >75%. The left  ventricle has hyperdynamic function. The left ventricle has no regional  wall motion abnormalities. There is moderate concentric left ventricular  hypertrophy. Left ventricular  diastolic function could not be evaluated.   3. Right ventricular systolic function is normal. The right ventricular  size is normal. There is normal pulmonary artery systolic pressure. The  estimated right ventricular systolic pressure is [id number] mmHg.   4. Left atrial size was mildly dilated.   5. The mitral valve is degenerative. No evidence of mitral valve  regurgitation. Moderate mitral annular calcification.   6. The inferior vena cava is normal in size with greater than 50%  respiratory variability, suggesting right atrial pressure of 3 mmHg.   Comparison(s): No significant change from prior study. AoV gradients  stable. Color jet described above that is abnormal.   Recent Labs: 07/22/2021: TSH 1.472 07/30/2021: ALT 22; B Natriuretic Peptide 194.4 08/04/2021: BUN 29; Creatinine, Ser 1.15; Hemoglobin 11.0; Magnesium 2.1; Platelets 387; Potassium 4.4; Sodium 133  Recent Lipid Panel No results found for: CHOL, TRIG, HDL, CHOLHDL, VLDL, LDLCALC, LDLDIRECT   Physical Exam:    VS:  BP (!) 106/50 (BP Location: Left Arm, Patient Position: Sitting, Cuff Size: Normal)    Pulse 60    Ht 5\' 1"  (1.549 m)    Wt 120 lb 6.4 oz (54.6 kg)    SpO2 97%    BMI 22.75 kg/m     Wt Readings from Last 3  Encounters:  09/10/21 120 lb 6.4 oz (54.6 kg)  09/03/21 119 lb (54 kg)  08/09/21 122 lb 3.2 oz (55.4 kg)    General: Elderly, NAD Neck: Negative for carotid bruits. No JVD Lungs:Clear to ausculation bilaterally. Breathing is unlabored. Cardiovascular: RRR with S1 S2. Systolic murmur present  with no rubs, gallops, or LV heave appreciated. Extremities: No edema.  Neuro: Alert and oriented. No focal deficits. No facial asymmetry. MAE spontaneously. Psych: Responds to questions appropriately with normal affect.    ASSESSMENT/PLAN:    Severe AS s/p TAVR: echo today shows EF >75% , normally functioning TAVR with a mean gradient of 9.5 mm hg and no PVL. There is abnormal color flow in the region of the prosthesis, but this is forward flow based on  color doppler. There is no apparenent regurgitation or paravalvular leak. Possibly, this represents flow  between the stent frame and aortic root. There is also a small color signal below the annulus in the 8-10 o'clock position. Cannot exclude a very small iatrogenic VSD. This was discussed in depth with the patient and Dr. Tomi Bamberger. Audie Box and her daughter. We will review the echo at our multidisciplinary valve team meeting next Tuesday, but given large symptomatic improvement since admission and TAVR, we will likely pursue conservative management. She has NYHA class II symptoms. She has Amoxicillin for SBE prophylaxis. Continue monotherapy with Eliquis. We will see her back in 1 year with echo and follow up.    Hx of pericardial effusion: no effusion on echo today.    Paroxysmal atrial fibrillation: continue Eliquis 2.5mg  BID (age >80yo; weight <60kg) along with Lopresor 25mg  BID. Given some mild fatigue and lower BPs today, will stop diltiazem CD and change to PRN if she has afib. (1% burden on most recent monitor).    Carotid artery disease: pre TAVR head/neck CTA with significant carotid artery stenosis with plans for surveillance dopplers in the OP  setting. She is followed by [facility]. Continue ASA, statin.    Chronic diastolic CHF: appears euvolemic on exam. Continue Lasix 10mg  BID.    HTN: BP on lower side. Will d/c Dilt CD as above to see if it helps with mild fatigue.   Hyponatremia: had chronic hyponatremia.  Lexapro stopped. Labs checked at [facility] 1/28 and Na normal at 138   Physical deconditioning: has been working with [facility]. Encouraged exercise to help with fatigue.   Liver lesion: pre TAVR CT showed multiple subcentimeter arterially enhancing lesions within the liver possibly representing small flash filled hemangioma. These are not optimally characterized on this examination may be better assessed with dedicated MRI examination if clinically indicated. Will defer this to her PCP.   Total time spent with patient was over 40 minutes which included evaluating patient, reviewing record and coordinating care. Face to face time >50%.    Medication Adjustments/Labs and Tests Ordered: Current medicines are reviewed at length with the patient today.  Concerns regarding medicines are outlined above.  Orders Placed This Encounter  Procedures   ECHOCARDIOGRAM COMPLETE   No orders of the defined types were placed in this encounter.   Patient Instructions  Medication Instructions:  Decrease Diltiazem 180 mg as needed for elevated heart rate   *If you need a refill on your cardiac medications before your next appointment, please call your pharmacy*   Lab Work: None ordered   If you have labs (blood work) drawn today and your tests are completely normal, you will receive your results only by: [place] (if you have MyChart) OR A paper copy in the mail If you have any lab test that is abnormal or we need to change your treatment, we will call you to review the results.   Testing/Procedures: Your physician has requested that you have an echocardiogram in January 2024. Echocardiography is a  painless test that uses sound waves  to create images of your heart. It provides your doctor with information about the size and shape of your heart and how well your hearts chambers and valves are working. This procedure takes approximately one hour. There are no restrictions for this procedure.    Follow-Up: Follow up as scheduled :1}    Other Instructions None     Signed, Angelena Form, PA-C  09/13/2021 2:33 PM    [facility]

## 2021-09-10 ENCOUNTER — Ambulatory Visit: Payer: Medicare HMO | Admitting: Physician Assistant

## 2021-09-10 ENCOUNTER — Other Ambulatory Visit: Payer: Self-pay | Admitting: Physician Assistant

## 2021-09-10 ENCOUNTER — Ambulatory Visit (HOSPITAL_COMMUNITY): Payer: Medicare HMO | Attending: Internal Medicine

## 2021-09-10 ENCOUNTER — Other Ambulatory Visit: Payer: Self-pay

## 2021-09-10 VITALS — BP 106/50 | HR 60 | Ht 61.0 in | Wt 120.4 lb

## 2021-09-10 DIAGNOSIS — Z952 Presence of prosthetic heart valve: Secondary | ICD-10-CM | POA: Diagnosis not present

## 2021-09-10 DIAGNOSIS — I5032 Chronic diastolic (congestive) heart failure: Secondary | ICD-10-CM

## 2021-09-10 DIAGNOSIS — E871 Hypo-osmolality and hyponatremia: Secondary | ICD-10-CM

## 2021-09-10 DIAGNOSIS — I6523 Occlusion and stenosis of bilateral carotid arteries: Secondary | ICD-10-CM

## 2021-09-10 DIAGNOSIS — I3139 Other pericardial effusion (noninflammatory): Secondary | ICD-10-CM

## 2021-09-10 DIAGNOSIS — I480 Paroxysmal atrial fibrillation: Secondary | ICD-10-CM | POA: Diagnosis not present

## 2021-09-10 DIAGNOSIS — K769 Liver disease, unspecified: Secondary | ICD-10-CM

## 2021-09-10 DIAGNOSIS — I10 Essential (primary) hypertension: Secondary | ICD-10-CM

## 2021-09-10 DIAGNOSIS — R5381 Other malaise: Secondary | ICD-10-CM

## 2021-09-10 LAB — ECHOCARDIOGRAM COMPLETE
AR max vel: 2.45 cm2
AV Area VTI: 2.42 cm2
AV Area mean vel: 2.57 cm2
AV Mean grad: 9.5 mmHg
AV Peak grad: 17.2 mmHg
Ao pk vel: 2.08 m/s
Area-P 1/2: 2.36 cm2
S' Lateral: 1.7 cm

## 2021-09-10 MED ORDER — ROSUVASTATIN CALCIUM 5 MG PO TABS: 5.0000 mg | ORAL_TABLET | Freq: Every day | ORAL | 11 refills | Status: DC

## 2021-09-10 NOTE — Patient Instructions (Signed)
Medication Instructions:  Decrease Diltiazem 180 mg as needed for elevated heart rate   *If you need a refill on your cardiac medications before your next appointment, please call your pharmacy*   Lab Work: None ordered   If you have labs (blood work) drawn today and your tests are completely normal, you will receive your results only by: [place] (if you have MyChart) OR A paper copy in the mail If you have any lab test that is abnormal or we need to change your treatment, we will call you to review the results.   Testing/Procedures: Your physician has requested that you have an echocardiogram in January 2024. Echocardiography is a painless test that uses sound waves to create images of your heart. It provides your doctor with information about the size and shape of your heart and how well your hearts chambers and valves are working. This procedure takes approximately one hour. There are no restrictions for this procedure.    Follow-Up: Follow up as scheduled :1}    Other Instructions None

## 2021-09-15 ENCOUNTER — Ambulatory Visit (INDEPENDENT_AMBULATORY_CARE_PROVIDER_SITE_OTHER): Payer: Medicare HMO | Admitting: Family Medicine

## 2021-09-15 ENCOUNTER — Encounter: Payer: Self-pay | Admitting: Family Medicine

## 2021-09-15 ENCOUNTER — Other Ambulatory Visit: Payer: Self-pay

## 2021-09-15 VITALS — BP 124/68 | HR 76 | Temp 96.9°F | Ht 60.0 in | Wt 119.6 lb

## 2021-09-15 DIAGNOSIS — I6523 Occlusion and stenosis of bilateral carotid arteries: Secondary | ICD-10-CM

## 2021-09-15 DIAGNOSIS — J471 Bronchiectasis with (acute) exacerbation: Secondary | ICD-10-CM | POA: Diagnosis not present

## 2021-09-15 DIAGNOSIS — Z952 Presence of prosthetic heart valve: Secondary | ICD-10-CM | POA: Diagnosis not present

## 2021-09-15 DIAGNOSIS — I480 Paroxysmal atrial fibrillation: Secondary | ICD-10-CM

## 2021-09-15 DIAGNOSIS — F419 Anxiety disorder, unspecified: Secondary | ICD-10-CM | POA: Diagnosis not present

## 2021-09-15 MED ORDER — ESCITALOPRAM OXALATE 5 MG PO TABS: 5.0000 mg | ORAL_TABLET | Freq: Every day | ORAL | 2 refills | Status: DC

## 2021-09-15 NOTE — Progress Notes (Signed)
Provider:  Jacalyn LefevreStephen Kobee Medlen, MD  Careteam: Patient Care Team: Frederica KusterMiller, Ekaterina Denise M, MD as PCP - General (Family Medicine) SwazilandJordan, Peter M, MD as PCP - Cardiology (Cardiology) Marisue BrooklynEjaz, Muhammad Shakir, MD as Referring Physician  PLACE OF SERVICE:  [facility]  Advanced Directive information Does Patient Have a Medical Advance Directive?: Yes, Type of Advance Directive: Healthcare Power of [place]orney;Living will, Does patient want to make changes to medical advance directive?: No - Patient declined  Allergies  Allergen Reactions   Codeine Nausea And Vomiting and Nausea Only   Cyclobenzaprine    Iodinated Contrast Media    Prednisone Other (See Comments)    Sores in mouth    Chief Complaint  Patient presents with   New Patient (Initial Visit)    Patient presents today for a new patient appointment.     HPI: Patient is a 87 y.o. female new patient here who is a patient with [facility].  Recently had TAVR surgery.  Also has a history of Takotsubo cardiomyopathy.  She is accompanied by her daughter today.  Initial discussion about her anxiety.  Had taken Lexapro with success but developed hyponatremia and it was stopped.  Alternatively discussions around lorazepam but there is concern about addictive nature of that so we mutually decided to continue Lexapro and monitor sodiums.  She is aware of symptoms of low blood sodium.  There is also a history of hypertension high cholesterol allergies and A-fib. We reviewed all her medicines in some detail.  For A-fib she had been on Cardizem as well as metoprolol.  It was suggested by cardiology just discontinue diltiazem and take it if she has palpitations or irregular heartbeats.  She is on Eliquis for stroke prevention. Since her aortic valve procedure she is doing much better.  She is resuming walking and Silver sneakers exercises. She is scheduled for some procedures in the near future including carotid angiogram and pulmonary  consultation.  She is asymptomatic in terms of TIAs or amaurosis.  She has seen vascular surgeon and there is some discrepancy with Dopplers versus CT done for chest disease.  Review of Systems:  Review of Systems  Constitutional: Negative.   HENT: Negative.    Respiratory:  Negative for cough.   Cardiovascular:  Positive for leg swelling.  Genitourinary: Negative.   Musculoskeletal: Negative.   Neurological: Negative.   Psychiatric/Behavioral:  The patient is nervous/anxious.   All other systems reviewed and are negative.  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 Lynnette Caffeyhukkani & Dr. Laneta SimmersBartle   Seasonal allergies    Takotsubo cardiomyopathy    Past Surgical History:  Procedure Laterality Date   COLONOSCOPY     EYE SURGERY Bilateral    cataracts removed   INTRAOPERATIVE TRANSTHORACIC ECHOCARDIOGRAM N/A 08/03/2021   Procedure: INTRAOPERATIVE TRANSTHORACIC ECHOCARDIOGRAM;  Surgeon: Orbie Pyohukkani, Arun K, MD;  Location: [facility];  Service: Open Heart  Surgery;  Laterality: N/A;   PERICARDIOCENTESIS N/A 07/28/2021   Procedure: PERICARDIOCENTESIS;  Surgeon: Early Osmond, MD;  Location: [place] [facility];  Service: Cardiovascular;  Laterality: N/A;   RIGHT/LEFT HEART CATH AND CORONARY ANGIOGRAPHY N/A 05/13/2019   Procedure: RIGHT/LEFT HEART CATH AND CORONARY ANGIOGRAPHY;  Surgeon: Martinique, Peter M, MD;  Location: [place] [facility];  Service: Cardiovascular;  Laterality: N/A;   RIGHT/LEFT HEART CATH AND CORONARY ANGIOGRAPHY N/A  07/23/2021   Procedure: RIGHT/LEFT HEART CATH AND CORONARY ANGIOGRAPHY;  Surgeon: Jettie Booze, MD;  Location: [place] [facility];  Service: Cardiovascular;  Laterality: N/A;   TONSILLECTOMY     TRANSCATHETER AORTIC VALVE REPLACEMENT, TRANSFEMORAL N/A 08/03/2021   Procedure: TRANSCATHETER AORTIC VALVE REPLACEMENT, TRANSFEMORAL;  Surgeon: Early Osmond, MD;  Location: [place] [facility];  Service: Open Heart Surgery;  Laterality: N/A;   UPPER GI ENDOSCOPY     Social History:   reports that she has quit smoking. Her smoking use included cigarettes. She has a 20.00 pack-year smoking history. She has never used smokeless tobacco. She reports current alcohol use. She reports that she does not use drugs.  Family History  Problem Relation Age of Onset   Cancer Sister    Lung cancer Sister    Alcohol abuse Sister    Melanoma Brother    Brain cancer Brother    Heart failure Brother    Diabetes Brother    Cancer Brother    Heart Problems Brother    Down syndrome Brother    Heart failure Brother    Endometriosis Daughter    Hypertension Other     Medications: Patient's Medications  New Prescriptions   No medications on file  Previous Medications   ACETAMINOPHEN (TYLENOL) 500 MG TABLET    Take 500-1,000 mg by mouth every 6 (six) hours as needed for moderate pain.   ALBUTEROL (VENTOLIN HFA) 108 (90 BASE) MCG/ACT INHALER    Inhale 2 puffs into the lungs every 4 (four) hours as needed for wheezing or shortness of breath (seasonal allergies).    APIXABAN (ELIQUIS) 2.5 MG TABS TABLET    Take 1 tablet (2.5 mg total) by mouth 2 (two) times daily.   CETIRIZINE (ZYRTEC) 10 MG TABLET    Take 10 mg by mouth daily with breakfast.   CHOLECALCIFEROL (VITAMIN D3 PO)    Take 1 tablet by mouth daily.   DILTIAZEM (CARDIZEM CD) 180 MG 24 HR CAPSULE    Take 180 mg by mouth as needed (Elevated heart rate).   DOCUSATE SODIUM (COLACE) 100 MG CAPSULE    Take 100 mg by mouth daily with breakfast.    FUROSEMIDE (LASIX) 20 MG TABLET    Take 0.5 tablets (10 mg total) by mouth 2 (two) times daily.   LISINOPRIL (ZESTRIL) 10 MG TABLET    Take 25 mg by mouth daily.   MELATONIN 5 MG TABS    Take 5 mg by mouth at bedtime.   METOPROLOL TARTRATE (LOPRESSOR) 25 MG TABLET    Take 1 tablet (25 mg total) by mouth 2 (two) times daily.   MONTELUKAST (SINGULAIR) 10 MG TABLET    Take 10 mg by mouth daily after supper.   MULTIPLE VITAMIN (MULTIVITAMIN WITH MINERALS) TABS TABLET    Take 1 tablet by mouth daily. Centrum 50 plus   MULTIPLE VITAMINS-MINERALS (EMERGEN-C VITAMIN C PO)    Take 1 packet by mouth.   MULTIPLE VITAMINS-MINERALS (EMERGEN-C VITAMIN C) PACK    Take 1 packet  by mouth daily with lunch.   OMEGA-3 FATTY ACIDS (FISH OIL PO)    Take 1 capsule by mouth daily.    OMEPRAZOLE (PRILOSEC) 20 MG CAPSULE    Take 20 mg by mouth daily before breakfast.   ROSUVASTATIN (CRESTOR) 5 MG TABLET    Take 1 tablet (5 mg total) by mouth daily.   TROLAMINE SALICYLATE (ASPERCREME EX)    Apply 1 application topically daily as needed (knee pain).  Modified Medications   No medications on file  Discontinued Medications   No medications on file    Physical Exam:  Vitals:   09/15/21 1025  BP: 124/68  Pulse: 76  Temp: (!) 96.9 F (36.1 C)  SpO2: 97%  Weight: 119 lb 9.6 oz (54.3 kg)  Height: 5' (1.524 m)   Body mass index is 23.36 kg/m. Wt Readings from Last 3 Encounters:  09/15/21 119 lb 9.6 oz (54.3 kg)  09/10/21 120 lb 6.4 oz (54.6 kg)  09/03/21 119 lb (54 kg)    Physical Exam Vitals and nursing note reviewed.  Constitutional:      Appearance: Normal appearance.  Neck:     Comments: There may be slight bruit right carotid Cardiovascular:     Rate and Rhythm: Normal rate and regular rhythm.     Pulses: Normal pulses.  Pulmonary:     Effort: Pulmonary effort is normal.     Breath sounds: Normal breath sounds.  Neurological:     General: No focal deficit present.     Mental Status: She is alert  and oriented to person, place, and time.    Labs reviewed: Basic Metabolic Panel: Recent Labs    07/21/21 1948 07/22/21 0022 07/22/21 0421 07/23/21 0745 07/29/21 0124 07/30/21 0128 08/03/21 1347 08/03/21 1438 08/03/21 1504 08/03/21 1542 08/04/21 0452  NA 128*  --  131*   < > 125* 125*   < > 131* 130* 131* 133*  K 4.2  --  3.9   < > 3.9 4.0   < > 4.2 4.3 4.3 4.4  CL 93*  --  93*   < > 93* 91*   < > 97*  --  99 101  CO2 27  --  28   < > 24 26  --   --   --   --  22  GLUCOSE 112*  --  109*   < > 104* 99   < > 145*  --  146* 134*  BUN 20  --  21   < > 17 20   < > 27*  --  25* 29*  CREATININE 0.93  --  1.09*   < > 0.97 1.06*   < > 1.00  --  1.00 1.15*  CALCIUM 8.9  --  9.1   < > 8.7* 8.7*  --   --   --   --  8.9  MG 2.1  --  2.2  --   --   --   --   --   --   --  2.1  PHOS  --  3.0 3.4  --   --   --   --   --   --   --   --   TSH  --  1.763 1.472  --   --   --   --   --   --   --   --    < > = values in this interval not displayed.   Liver  Function Tests: Recent Labs    07/22/21 0421 07/28/21 0716 07/30/21 0128  AST 70* 18 15  ALT 99* 31 22  ALKPHOS 175* 82 66  BILITOT 0.9 0.7 0.4  PROT 6.6 5.9* 5.7*  ALBUMIN 3.2* 2.7* 2.6*   No results for input(s): LIPASE, AMYLASE in the last 8760 hours. No results for input(s): AMMONIA in the last 8760 hours. CBC: Recent Labs    06/30/21 1323 07/21/21 1948 07/22/21 0421 07/23/21 0937 07/28/21 0716 07/30/21 0128 08/03/21 1347 08/03/21 1504 08/03/21 1542 08/04/21 0452  WBC 8.1 8.4 7.5  --  7.2 8.9  --   --   --  12.3*  NEUTROABS 5.6 5.8 4.7  --   --   --   --   --   --   --   HGB 12.8 11.5* 11.3*   < > 11.4* 11.4*   < > 10.2* 10.2* 11.0*  HCT 38.6 35.6* 33.6*   < > 33.3* 34.0*   < > 30.0* 30.0* 34.5*  MCV 88.9 91.8 90.8  --  87.6 88.5  --   --   --  91.8  PLT 199 267 275  --  352 394  --   --   --  387   < > = values in this interval not displayed.   Lipid Panel: No results for input(s): CHOL, HDL, LDLCALC, TRIG,  CHOLHDL, LDLDIRECT in the last 8760 hours. TSH: Recent Labs    07/22/21 0022 07/22/21 0421  TSH 1.763 1.472   A1C: No results found for: HGBA1C   Assessment/Plan  1. Anxiety We will continue Lexapro at 5 mg  2. Bronchiectasis with acute exacerbation ([place]) Patient is asymptomatic there is no shortness of breath or cough.  We will hold off on further evaluation since there are no symptoms  3. Bilateral carotid artery stenosis No history of TIA or amaurosis.  She plans to cancel angiogram  4. S/P TAVR (transcatheter aortic valve replacement) Doing really well postprocedure and followed by cardiology  5. PAF (paroxysmal atrial fibrillation) (HCC) Appears to be in sinus rhythm today.  Continue on Eliquis as well as metoprolol as rate control or   Alain Honey, MD [facility] [phone]

## 2021-09-21 ENCOUNTER — Other Ambulatory Visit: Payer: Medicare HMO

## 2021-09-23 ENCOUNTER — Ambulatory Visit: Payer: Medicare HMO | Admitting: Vascular Surgery

## 2021-10-05 ENCOUNTER — Ambulatory Visit: Payer: Medicare HMO | Admitting: Student

## 2021-10-26 ENCOUNTER — Telehealth: Payer: Self-pay | Admitting: Physician Assistant

## 2021-10-26 ENCOUNTER — Ambulatory Visit (INDEPENDENT_AMBULATORY_CARE_PROVIDER_SITE_OTHER): Payer: Medicare HMO | Admitting: Family

## 2021-10-26 ENCOUNTER — Encounter: Payer: Self-pay | Admitting: Family

## 2021-10-26 VITALS — BP 106/62 | HR 81 | Temp 97.5°F | Resp 16 | Ht 60.0 in | Wt 119.4 lb

## 2021-10-26 DIAGNOSIS — I951 Orthostatic hypotension: Secondary | ICD-10-CM | POA: Diagnosis not present

## 2021-10-26 DIAGNOSIS — R5383 Other fatigue: Secondary | ICD-10-CM

## 2021-10-26 DIAGNOSIS — R42 Dizziness and giddiness: Secondary | ICD-10-CM | POA: Diagnosis not present

## 2021-10-26 NOTE — Patient Instructions (Addendum)
-   Wear compression stockings on in the morning and off at bedtime ? ?- check blood pressure twice daily and notify provider if blood pressure < 100/60 or > 140/90  ? ?- Please schedule appointment with Cardiologist office to schedule appointment  ?

## 2021-10-26 NOTE — Telephone Encounter (Signed)
?  [facility]   ?MULTIDISCIPLINARY HEART VALVE TEAM ? ?Patient called in bc she was seen by her PCP and having orthostasis with soft BPs. They did not want to make any BP med changes as she follows with cardiology. She called into the office bc the soonest she could get in was 4/26 with Dr. [place]. I have advised that she hold her Lisinopril for now and keep a log of symptoms and BP for Dr. [place] on 4/26. She will call back in if they continue to have issues and need to be seen sooner by an APP.  ? ?Cline Crock PA-C  [facility]  ? ?

## 2021-10-26 NOTE — Progress Notes (Signed)
? ?Provider: Richarda Blade FNP-C ? ?Frederica Kuster, MD ? ?Patient Care Team: ?Frederica Kuster, MD as PCP - General (Family Medicine) ?Sw[place]il[place], Peter M, MD as PCP - Cardiology (Cardiology) ?Marisue Brooklyn, MD as Referring Physician ? ?Extended Emergency Contact Information ?Primary Emergency Contact: Nusz,Holly ?Address: [address] [employer] ?         [place], [place] [address] [place] ?Mobile Phone: [phone] ?Relation: Daughter ? ?Code Status:  Full Code  ?Goals of care: Advanced Directive information ? ?  10/26/2021  ?  3:06 PM  ?Advanced Directives  ?Does Patient Have a Medical Advance Directive? Yes  ?Type of Estate agent of [place];Living will  ?Does patient want to make changes to medical advance directive? No - Patient declined  ?Copy of Healthcare Power of Attorney in Chart? Yes - validated most recent copy scanned in chart (See row information)  ? ? ? ?Chief Complaint  ?Patient presents with  ? Acute Visit  ?  Patient complains of lightheadedness that comes and goes. Usually in the mornings. During movements/standing.  ? ? ?HPI:  ?Pt is a 87 y.o. female seen today for an acute visit for evaluation of lightheadedness for couple of months. She is here with her daughter today.  She described lightheadedness of as intermittent.  Usually worse with movements and when standing up.  Occurs mostly in the morning between 730 and 9 AM.  Also states feels tired most of the time especially when walking around the house.  Has had to stop for a few minutes then she reenergized this has self and is able to complete what ever test she is doing.  She does not check her blood pressure at home. ? ?She denies any headache,vision changes,,chest tightness,palpitation,chest pain or shortness of breath. Also denies any contact with sick persons with COVID 19.    ? ?Past Medical History:  ?Diagnosis Date  ? Allergies   ? Anxiety   ? panic attacks  ? Aortic stenosis   ? Arthritis   ?  CAD (coronary artery disease)   ? non obst CAD  ? GERD (gastroesophageal reflux disease)   ? Heart murmur   ? Hemorrhoids   ? History of mammogram   ? Hyperlipemia   ? Hypertension   ? Internal carotid artery stenosis   ? PAF (paroxysmal atrial fibrillation) (HCC)   ? 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 s/p TAVR with a 23 mm Edwards S3UR via the TF approach by Dr. Lynnette Caffey & Dr. Laneta Simmers  ? Seasonal allergies   ? Takotsubo cardiomyopathy   ? ?Past Surgical History:  ?Procedure Laterality Date  ? COLONOSCOPY    ? EYE SURGERY Bilateral   ? cataracts removed  ? INTRAOPERATIVE TRANSTHORACIC ECHOCARDIOGRAM N/A 08/03/2021  ? Procedure: INTRAOPERATIVE TRANSTHORACIC ECHOCARDIOGRAM;  Surgeon: Orbie Pyo, MD;  Location: [facility] INVASIVE CV LAB;  Service: Open Heart Surgery;  Laterality: N/A;  ? PERICARDIOCENTESIS N/A 07/28/2021  ? Procedure: PERICARDIOCENTESIS;  Surgeon: Orbie Pyo, MD;  Location: [facility] INVAS[facility] LAB;  Service: Cardiovascular;  Laterality: N/A;  ? RIGHT/LEFT HEART CATH AND CORONARY ANGIOGRAPHY N/A 05/13/2019  ? Procedure: RIGHT/LEFT HEART CATH AND CORONARY ANGIOGRAPHY;  Surgeon: Swazil[place], Peter M, MD;  Location: [facility] INVASIVE CV LAB;  Service: Cardiovascular;  Laterality: N/A;  ? RIGHT/LEFT HEART CATH AND CORONARY ANGIOGRAPHY N/A 07/23/2021  ? Procedure: RIGHT/LEFT HEART CATH AND CORONARY ANGIOGRAPHY;  Surgeon: Corky Crafts, MD;  Location: [facility] INVASIVE CV LAB;  Service: Cardiovascular;  Laterality: N/A;  ? TONSILLECTOMY    ? TRANSCATHETER AORTIC VALVE REPLACEMENT, TRANSFEMORAL N/A 08/03/2021  ? Procedure: TRANSCATHETER AORTIC VALVE REPLACEMENT, TRANSFEMORAL;  Surgeon: Orbie Pyohukkani, Arun K, MD;  Location: [facility];  Service: Open Heart Surgery;  Laterality: N/A;  ? UPPER GI ENDOSCOPY    ? ? ?Allergies  ?Allergen Reactions  ? Codeine Nausea And Vomiting and Nausea Only  ? Cyclobenzaprine   ? Iodinated Contrast Media   ? Prednisone Other (See Comments)  ?  Sores in mouth   ? ? ?Outpatient Encounter Medications as of 10/26/2021  ?Medication Sig  ? acetaminophen (TYLENOL) 500 MG tablet Take 500-1,000 mg by mouth every 6 (six) hours as needed for moderate pain.  ? albuterol (VENTOLIN HFA) 108 (90 Base) MCG/ACT inhaler Inhale 2 puffs into the lungs every 4 (four) hours as needed for wheezing or shortness of breath (seasonal allergies).   ? apixaban (ELIQUIS) 2.5 MG TABS tablet Take 1 tablet (2.5 mg total) by mouth 2 (two) times daily.  ? Cholecalciferol (VITAMIN D3 PO) Take 1 tablet by mouth daily.  ? diltiazem (CARDIZEM CD) 180 MG 24 hr capsule Take 180 mg by mouth as needed (Elevated heart rate).  ? docusate sodium (COLACE) 100 MG capsule Take 100 mg by mouth daily with breakfast.  ? escitalopram (LEXAPRO) 5 MG tablet Take 1 tablet (5 mg total) by mouth daily.  ? fexofenadine (ALLEGRA) 60 MG tablet Take 60 mg by mouth daily.  ? furosemide (LASIX) 20 MG tablet Take 0.5 tablets (10 mg total) by mouth 2 (two) times daily.  ? lisinopril (ZESTRIL) 10 MG tablet Take 25 mg by mouth daily.  ? melatonin 5 MG TABS Take 5 mg by mouth at bedtime.  ? metoprolol tartrate (LOPRESSOR) 25 MG tablet Take 1 tablet (25 mg total) by mouth 2 (two) times daily.  ? montelukast (SINGULAIR) 10 MG tablet Take 10 mg by mouth daily after supper.  ? Multiple Vitamin (MULTIVITAMIN WITH MINERALS) TABS tablet Take 1 tablet by mouth daily. Centrum 50 plus  ? Multiple Vitamins-Minerals (EMERGEN-C VITAMIN C PO) Take 1 packet by mouth.  ? Multiple Vitamins-Minerals (EMERGEN-C VITAMIN C) PACK Take 1 packet by mouth daily with lunch.  ? Omega-3 Fatty Acids (FISH OIL PO) Take 1 capsule by mouth daily.   ? omeprazole (PRILOSEC) 20 MG capsule Take 20 mg by mouth daily before breakfast.  ? rosuvastatin (CRESTOR) 5 MG tablet Take 1 tablet (5 mg total) by mouth daily.  ? Trolamine Salicylate (ASPERCREME EX) Apply 1 application topically daily as needed (knee pain).  ? [DISCONTINUED] cetirizine (ZYRTEC) 10 MG tablet Take 10 mg by  mouth daily with breakfast.  ? ?No facility-administered encounter medications on file as of 10/26/2021.  ? ? ?Review of Systems  ?Constitutional:  Positive for fatigue. Negative for appetite change, chills, fever and unexpected weight change.  ?HENT:  Negative for congestion, dental problem, ear discharge, ear pain, facial swelling, hearing loss, nosebleeds, postnasal drip, rhinorrhea, sinus pressure, sinus pain, sneezing and sore throat.   ?Eyes:  Negative for pain, discharge, redness, itching and visual disturbance.  ?Respiratory:  Negative for cough, chest tightness, shortness of breath and wheezing.   ?Cardiovascular:  Negative for chest pain, palpitations and leg swelling.  ?Gastrointestinal:  Negative for abdominal distention, abdominal pain, blood in stool, constipation, diarrhea, nausea and vomiting.  ?Genitourinary:  Negative for difficulty urinating, dysuria, flank pain, frequency and urgency.  ?Musculoskeletal:  Negative for arthralgias, back pain, gait problem, joint swelling,  myalgias, neck pain and neck stiffness.  ?Skin:  Negative for color change, pallor, rash and wound.  ?Neurological:  Positive for light-headedness. Negative for dizziness, syncope, speech difficulty, weakness, numbness and headaches.  ?Hematological:  Does not bruise/bleed easily.  ?Psychiatric/Behavioral:  Negative for agitation, behavioral problems, confusion, hallucinations and sleep disturbance. The patient is not nervous/anxious.   ? ?Immunization History  ?Administered Date(s) Administered  ? Fluad Quad(high Dose 65+) 04/07/2019  ? Influenza Split 05/10/2006, 04/25/2007, 04/08/2008, 11/10/2008  ? Influenza, High Dose Seasonal PF 04/20/2017, 04/07/2019, 03/25/2020, 04/27/2021  ? Influenza-Unspecified 05/10/2006, 04/25/2007, 04/08/2008, 07/09/2008, 11/10/2008, 03/31/2015, 04/20/2018, 04/07/2019  ? PFIZER Comirnaty(Gray Top)Covid-19 Tri-Sucrose Vaccine 08/14/2019, 09/04/2019, 04/20/2020, 10/25/2020, 04/12/2021  ? Pneumococcal  Conjugate-13 09/29/2015  ? Pneumococcal Polysaccharide-23 03/12/2007  ? Tdap 07/14/2014  ? Zoster Recombinat (Shingrix) 12/10/2019  ? Zoster, Live 08/18/2004, 01/17/2007  ? ?Pertinent  Health Maintenance Due

## 2021-10-27 ENCOUNTER — Ambulatory Visit: Payer: Medicare HMO | Admitting: Family

## 2021-10-27 ENCOUNTER — Telehealth: Payer: Self-pay

## 2021-10-27 ENCOUNTER — Encounter: Payer: Self-pay | Admitting: Family Medicine

## 2021-10-27 LAB — CBC WITH DIFFERENTIAL/PLATELET
Absolute Monocytes: 436 cells/uL (ref 200–950)
Basophils Absolute: 59 cells/uL (ref 0–200)
Basophils Relative: 0.9 %
Eosinophils Absolute: 132 cells/uL (ref 15–500)
Eosinophils Relative: 2 %
HCT: 36.5 % (ref 35.0–45.0)
Hemoglobin: 11.6 g/dL — ABNORMAL LOW (ref 11.7–15.5)
Lymphs Abs: 3029 cells/uL (ref 850–3900)
MCH: 29 pg (ref 27.0–33.0)
MCHC: 31.8 g/dL — ABNORMAL LOW (ref 32.0–36.0)
MCV: 91.3 fL (ref 80.0–100.0)
MPV: 11.4 fL (ref 7.5–12.5)
Monocytes Relative: 6.6 %
Neutro Abs: 2944 cells/uL (ref 1500–7800)
Neutrophils Relative %: 44.6 %
Platelets: 199 10*3/uL (ref 140–400)
RBC: 4 10*6/uL (ref 3.80–5.10)
RDW: 13.1 % (ref 11.0–15.0)
Total Lymphocyte: 45.9 %
WBC: 6.6 10*3/uL (ref 3.8–10.8)

## 2021-10-27 LAB — BASIC METABOLIC PANEL WITH GFR
BUN/Creatinine Ratio: 22 (calc) (ref 6–22)
BUN: 22 mg/dL (ref 7–25)
CO2: 29 mmol/L (ref 20–32)
Calcium: 9.8 mg/dL (ref 8.6–10.4)
Chloride: 103 mmol/L (ref 98–110)
Creat: 1.01 mg/dL — ABNORMAL HIGH (ref 0.60–0.95)
Glucose, Bld: 104 mg/dL (ref 65–139)
Potassium: 4.3 mmol/L (ref 3.5–5.3)
Sodium: 139 mmol/L (ref 135–146)
eGFR: 55 mL/min/{1.73_m2} — ABNORMAL LOW (ref >=60)

## 2021-10-27 NOTE — Telephone Encounter (Signed)
Forwarded to Dinah due to Dr. Miller out of office.  ?

## 2021-10-27 NOTE — Telephone Encounter (Signed)
Sent patient MyChart message with Sandrea Hughs, NP comment, per patients request.  ?

## 2021-10-27 NOTE — Telephone Encounter (Signed)
Dr.Miller will get your message when he returns to the office since he is off today.  ?

## 2021-11-02 ENCOUNTER — Encounter: Payer: Medicare HMO | Admitting: Family Medicine

## 2021-11-03 ENCOUNTER — Telehealth: Payer: Self-pay | Admitting: Family Medicine

## 2021-11-03 NOTE — Progress Notes (Signed)
This encounter was created in error - please disregard.

## 2021-11-03 NOTE — Progress Notes (Signed)
This service is provided via telemedicine ? ?No vital signs collected/recorded due to the encounter was a telemedicine visit.  ? ?Location of patient (ex: home, work):  Home ? ?Patient consents to a telephone visit:   ? ?Location of the provider (ex: office, home):  Office ? ?Name of any referring provider:  Frederica Kuster, MD ? ? ?Names of all persons participating in the telemedicine service and their role in the encounter:  Barbara Mcconnell (patient); Porsha McClurkin,CMA; Dr.Rebekha Diveley ? ?Time spent on call:  8 minutes  ?There are no: Patient was not seen but communicated by telephone call ?

## 2021-11-03 NOTE — Telephone Encounter (Signed)
Spoke with patient via telephone this afternoon.  She had been seen in the office for some weakness and lightheadedness and had formally been on metoprolol as well as lisinopril.  The lisinopril was discontinued.  She has monitored her pressure and is seeing some systolic numbers as high as 150s.  Diastolic numbers remain normal but there is a pulse pressure differential.  Since she is anxious about stopping this the lisinopril totally I recommended to add it back but only 5 mg. ?I still gave her parameters to call if pressure gets below 100/60 and on the low in and 160/90 on the top and. ?

## 2021-11-09 ENCOUNTER — Encounter: Payer: Self-pay | Admitting: Family Medicine

## 2021-11-09 NOTE — Telephone Encounter (Signed)
Appointment scheduled with Dinah for 11/10/2021. (Offered appointment for today but daughter could not bring in) ?

## 2021-11-10 ENCOUNTER — Ambulatory Visit (INDEPENDENT_AMBULATORY_CARE_PROVIDER_SITE_OTHER): Payer: Medicare HMO | Admitting: Family

## 2021-11-10 ENCOUNTER — Encounter: Payer: Self-pay | Admitting: Family

## 2021-11-10 VITALS — BP 116/68 | HR 72 | Temp 98.1°F | Resp 20 | Ht 60.0 in | Wt 117.6 lb

## 2021-11-10 DIAGNOSIS — H9192 Unspecified hearing loss, left ear: Secondary | ICD-10-CM | POA: Diagnosis not present

## 2021-11-10 DIAGNOSIS — H9209 Otalgia, unspecified ear: Secondary | ICD-10-CM

## 2021-11-10 NOTE — Patient Instructions (Signed)
-   continue on Tylenol for pain  ?

## 2021-11-10 NOTE — Progress Notes (Signed)
? ? ?Provider: Richarda Bladeinah Gabriela Irigoyen FNP-C ? ?Frederica KusterMiller, Stephen M, MD ? ?Patient Care Team: ?Frederica KusterMiller, Stephen M, MD as PCP - General (Family Medicine) ?SwazilandJordan, Peter M, MD as PCP - Cardiology (Cardiology) ?Marisue BrooklynEjaz, Muhammad Shakir, MD as Referring Physician ? ?Extended Emergency Contact Information ?Primary Emergency Contact: Dotts,Holly ?Address: [address] ?         [place], [place] [address] [place] ?Mobile Phone: [phone] ?Relation: Daughter ? ?Code Status: Full Code  ?Goals of care: Advanced Directive information ? ?  10/26/2021  ?  3:06 PM  ?Advanced Directives  ?Does Patient Have a Medical Advance Directive? Yes  ?Type of Estate agentAdvance Directive Healthcare Power of HomerAttorney;Living will  ?Does patient want to make changes to medical advance directive? No - Patient declined  ?Copy of Healthcare Power of Attorney in Chart? Yes - validated most recent copy scanned in chart (See row information)  ? ? ? ?Chief Complaint  ?Patient presents with  ? Acute Visit  ?  Patient complains of ear ache/pressure since starting lisinopril. Not sure if this is the cause of pain and pressure. Notices pain more when standing up.Patient alsohas pressure on left side of neck.No headache. Runny nose, sneezing due to allergies. Denies sinus pressure and nasal drainage is clear.  ? ? ?HPI:  ?Pt is a 87 y.o. female seen today for an acute visit for evaluation of left ear pain x 2 weeks.pain is described as dull ache pain which is intermittent.pain worst with standing.Also has allergies with runny nose on right nares and sneezing. ?Brought in her blood pressure log states adjusted her lisinopril to 5 mg tablet on 11/03/2021 not as directed. B/p readings ranging in the 130's/50's - 150's /70's with x 2 readings SBP in the 160's.  She is worried about checking her blood pressures daily states making her more anxious which could be contributing to her high blood pressures. ? ? ?Past Medical History:  ?Diagnosis Date  ? Allergies   ?  Anxiety   ? panic attacks  ? Aortic stenosis   ? Arthritis   ? CAD (coronary artery disease)   ? non obst CAD  ? GERD (gastroesophageal reflux disease)   ? Heart murmur   ? Hemorrhoids   ? History of mammogram   ? Hyperlipemia   ? Hypertension   ? Internal carotid artery stenosis   ? PAF (paroxysmal atrial fibrillation) (HCC)   ? 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 s/p TAVR with a 23 mm Edwards S3UR via the TF approach by Dr. Lynnette Caffeyhukkani & Dr. Laneta SimmersBartle  ? Seasonal allergies   ? Takotsubo cardiomyopathy   ? ?Past Surgical History:  ?Procedure Laterality Date  ? COLONOSCOPY    ? EYE SURGERY Bilateral   ? cataracts removed  ? INTRAOPERATIVE TRANSTHORACIC ECHOCARDIOGRAM N/A 08/03/2021  ? Procedure: INTRAOPERATIVE TRANSTHORACIC ECHOCARDIOGRAM;  Surgeon: Orbie Pyohukkani, Arun K, MD;  [facility];  Service: Open Heart Surgery;  Laterality: N/A;  ? PERICARDIOCENTESIS N/A 07/28/2021  ? Procedure: PERICARDIOCENTESIS;  Surgeon: Orbie Pyohukkani, Arun K, MD;  [facility];  Service: Cardiovascular;  Laterality: N/A;  ? RIGHT/LEFT HEART CATH AND CORONARY ANGIOGRAPHY N/A 05/13/2019  ? Procedure: RIGHT/LEFT HEART CATH AND CORONARY ANGIOGRAPHY;  Surgeon: SwazilandJordan, Peter M, MD;  [facility];  Service: Cardiovascular;  Laterality: N/A;  ? RIGHT/LEFT HEART CATH AND CORONARY ANGIOGRAPHY N/A 07/23/2021  ? Procedure: RIGHT/LEFT HEART CATH AND CORONARY ANGIOGRAPHY;  Surgeon: Corky CraftsVaranasi, Jayadeep S, MD;  [facility]: [facility];  Service: Cardiovascular;  Laterality: N/A;  ? TONSILLECTOMY    ? TRANSCATHETER AORTIC VALVE REPLACEMENT, TRANSFEMORAL N/A 08/03/2021  ? Procedure: TRANSCATHETER AORTIC VALVE REPLACEMENT, TRANSFEMORAL;  Surgeon: Orbie Pyo, MD;  Location: [facility] CV LAB;  Service: Open Heart Surgery;  Laterality: N/A;  ? UPPER GI ENDOSCOPY    ? ? ?Allergies  ?Allergen Reactions  ? Codeine Nausea And Vomiting and Nausea Only  ? Cyclobenzaprine   ? Iodinated Contrast  Media   ? Prednisone Other (See Comments)  ?  Sores in mouth  ? ? ?Outpatient Encounter Medications as of 11/10/2021  ?Medication Sig  ? acetaminophen (TYLENOL) 500 MG tablet Take 500-1,000 mg by mouth every 6 (six) hours as needed for moderate pain.  ? albuterol (VENTOLIN HFA) 108 (90 Base) MCG/ACT inhaler Inhale 2 puffs into the lungs every 4 (four) hours as needed for wheezing or shortness of breath (seasonal allergies).   ? apixaban (ELIQUIS) 2.5 MG TABS tablet Take 1 tablet (2.5 mg total) by mouth 2 (two) times daily.  ? Cholecalciferol (VITAMIN D3 PO) Take 1 tablet by mouth daily.  ? diltiazem (CARDIZEM CD) 180 MG 24 hr capsule Take 180 mg by mouth as needed (Elevated heart rate).  ? docusate sodium (COLACE) 100 MG capsule Take 100 mg by mouth daily with breakfast.  ? escitalopram (LEXAPRO) 5 MG tablet Take 1 tablet (5 mg total) by mouth daily.  ? fexofenadine (ALLEGRA) 60 MG tablet Take 60 mg by mouth daily.  ? furosemide (LASIX) 20 MG tablet Take 0.5 tablets (10 mg total) by mouth 2 (two) times daily.  ? lisinopril (ZESTRIL) 5 MG tablet Take 5 mg by mouth daily.  ? melatonin 5 MG TABS Take 5 mg by mouth at bedtime.  ? metoprolol tartrate (LOPRESSOR) 25 MG tablet Take 1 tablet (25 mg total) by mouth 2 (two) times daily.  ? montelukast (SINGULAIR) 10 MG tablet Take 10 mg by mouth daily after supper.  ? Multiple Vitamin (MULTIVITAMIN WITH MINERALS) TABS tablet Take 1 tablet by mouth daily. Centrum 50 plus  ? Multiple Vitamins-Minerals (EMERGEN-C VITAMIN C PO) Take 1 packet by mouth.  ? Omega-3 Fatty Acids (FISH OIL PO) Take 1 capsule by mouth daily.   ? omeprazole (PRILOSEC) 20 MG capsule Take 20 mg by mouth daily before breakfast.  ? rosuvastatin (CRESTOR) 5 MG tablet Take 1 tablet (5 mg total) by mouth daily.  ? Trolamine Salicylate (ASPERCREME EX) Apply 1 application topically daily as needed (knee pain).  ? [DISCONTINUED] Multiple Vitamins-Minerals (EMERGEN-C VITAMIN C) PACK Take 1 packet by mouth daily with  lunch.  ? ?No facility-administered encounter medications on file as of 11/10/2021.  ? ? ?Review of Systems  ?Constitutional:  Negative for appetite change, chills, fatigue, fever and unexpected weight change.  ?HENT:  Positive for congestion, ear pain, rhinorrhea and sneezing. Negative for dental problem, ear discharge, facial swelling, hearing loss, nosebleeds, postnasal drip, sinus pressure, sinus pain, sore throat, tinnitus and trouble swallowing.   ?     Left ear pain   ?Eyes:  Negative for pain, discharge, redness, itching and visual disturbance.  ?Respiratory:  Negative for cough, chest tightness, shortness of breath and wheezing.   ?Cardiovascular:  Negative for chest pain, palpitations and leg swelling.  ?Gastrointestinal:  Negative for abdominal distention, abdominal pain, constipation, nausea and vomiting.  ?Genitourinary:  Negative for difficulty urinating, dysuria, flank pain, frequency and urgency.  ?Musculoskeletal:  Negative for arthralgias, back pain, gait problem, joint  swelling, myalgias, neck pain and neck stiffness.  ?Skin:  Negative for color change, pallor and rash.  ?Neurological:  Negative for dizziness, syncope, speech difficulty, weakness, light-headedness, numbness and headaches.  ?Hematological:  Does not bruise/bleed easily.  ?Psychiatric/Behavioral:  Negative for agitation, behavioral problems, confusion, hallucinations and sleep disturbance. The patient is not nervous/anxious.   ? ?Immunization History  ?Administered Date(s) Administered  ? Fluad Quad(high Dose 65+) 04/07/2019  ? Influenza Split 05/10/2006, 04/25/2007, 04/08/2008, 11/10/2008  ? Influenza, High Dose Seasonal PF 04/20/2017, 04/07/2019, 03/25/2020, 04/27/2021  ? Influenza-Unspecified 05/10/2006, 04/25/2007, 04/08/2008, 07/09/2008, 11/10/2008, 03/31/2015, 04/20/2018, 04/07/2019  ? PFIZER Comirnaty(Gray Top)Covid-19 Tri-Sucrose Vaccine 08/14/2019, 09/04/2019, 04/20/2020, 10/25/2020, 04/12/2021  ? Pneumococcal Conjugate-13  09/29/2015  ? Pneumococcal Polysaccharide-23 03/12/2007  ? Tdap 07/14/2014  ? Zoster Recombinat (Shingrix) 12/10/2019  ? Zoster, Live 08/18/2004, 01/17/2007  ? ?Pertinent  Health Maintenance Due  ?Topic D

## 2021-11-12 NOTE — Progress Notes (Signed)
?Cardiology Office Note:   ? ?Date:  11/17/2021  ? ?ID:  Barbara Mcconnell, DOB 1935/01/21, MRN [id number] ? ?PCP:  Wardell Honour, MD  ?Cardiologist:  Wilhelmenia Addis Martinique, MD  ?Electrophysiologist:  None  ? ?Referring MD: Kristopher Glee., MD  ? ?Chief Complaint  ?Patient presents with  ? Aortic Stenosis  ? Atrial Fibrillation  ? ? ?History of Present Illness:   ? ?Barbara Mcconnell is a 87 y.o. female is seen for follow up aortic stenosis s/p TAVR and Takotsubo cardiomyopathy.  She had a cardiac event approximately 7 years ago in [facility], we have no records of that. According to the patient this was Takotsubos cardiomyopathy.   Other medical issues include hypertension, peripheral vascular disease with moderate carotid stenosis, and dyslipidemia with a history of statin intolerance.   ? ?The patient presented to the med center in [place] 05/13/2019 with substernal chest pain and diaphoresis.  She ruled in for an MI.  Admitted to [facility] hospital. Catheterization was done which showed a 50% RCA otherwise normal coronaries and an ejection fraction of 30 to 35% with moderate AS. LV pattern was c/w Takotsubo's disease.  In the hospital she was somewhat hypotensive but this improved prior to discharge and she was put back on her lisinopril but at a lower dose. She is also on low dose Toprol. ? ?On repeat Echo in March 2021 her EF had normalized but there was moderate to severe Aortic stenosis and repeat in 6 months was recommended. Unfortunately she did not have follow up until she presented to [facility] 07/21/21-07/30/21 for acute dyspnea. Echo showed normal LVEF with a moderate circumfrential pericardial effusion measuring 1.5 cm. There was moderate AS with a mean gradient at 4mmHg (was 20.62mmhg), AVA 1.38cm and DI 0.29. She underwent [facility] 07/23/21 which showed stable, mild to moderate CAD with hemodynamics consistent with pulmonary hypertension. Due to moderate AS, she was initially felt to be a good PROGRESS trial candidate however  with discrepancies between echocardiogram findings and inability to cross AV, pre-TAVR CT imaging was obtained. These showed an AVA 1.01cm2, annular area measuring 456mm2, and large circumferential pericardial effusion. After further discussion, she opted to defer PROGRESS trail consideration and move forward with TAVR given progressive symptoms, LVH with small LV cavity and calcium score at 2093. Her hospital stay was further complicated by new onset atrial fibrillation with RVR. She was placed on diltiazem infusion with relatively quick conversion to NSR. She ultimately underwent pericardiocentesis 07/28/21 and drain placement. This was discontinued 07/29/21 and the patient was discharged from the hospital 07/30/21. During her admission, she was evaluated by our multidisciplinary valve team and plans were made for TAVR on 08/03/21.  ?  ?She underwent successful TAVR with a 23 mm Edwards Sapien 3 Resilia via the TF approach on 08/03/21. Post operative echo showed LVEF at 65-70% with G1DD, mild MR, mild MS, trivial PVL in the right coronary ostium with mild MR with a mean gradient at 10.9mmHg. ECG with NSR and frequent PVCs however no high grade heart block. She was initially started on ASA 81mg  QD however this was changed to Eliquis 2.5mg  BID after discharge due to AF on ZIO monitor. ? ?She was seen in the valve clinic in February and doing well. Echo follow up as noted below. Later she developed orthostatic symptoms and low BP so ACEi was discontinued. This was later resumed at 5 mg daily when BP went up.  ? ?She is seen today with her daughter. She is  doing well. Main complaint is of some pain in her left ear. Notes occasional lightheadedness if she turns her head quickly. BP has been well controlled. Going to the gym 3 days a week.  ? ? ? ? ?Past Medical History:  ?Diagnosis Date  ? Allergies   ? Anxiety   ? panic attacks  ? Aortic stenosis   ? Arthritis   ? CAD (coronary artery disease)   ? non obst CAD  ? GERD  (gastroesophageal reflux disease)   ? Heart murmur   ? Hemorrhoids   ? History of mammogram   ? Hyperlipemia   ? Hypertension   ? Internal carotid artery stenosis   ? PAF (paroxysmal atrial fibrillation) ([place])   ? 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 s/p TAVR with a 23 mm Edwards S3UR via the TF approach by Dr. Ali Lowe & Dr. Cyndia Bent  ? Seasonal allergies   ? Takotsubo cardiomyopathy   ? ? ?Past Surgical History:  ?Procedure Laterality Date  ? COLONOSCOPY    ? EYE SURGERY Bilateral   ? cataracts removed  ? INTRAOPERATIVE TRANSTHORACIC ECHOCARDIOGRAM N/A 08/03/2021  ? Procedure: INTRAOPERATIVE TRANSTHORACIC ECHOCARDIOGRAM;  Surgeon: Early Osmond, MD;  Location: [place] CV LAB;  Service: Open Heart Surgery;  Laterality: N/A;  ? PERICARDIOCENTESIS N/A 07/28/2021  ? Procedure: PERICARDIOCENTESIS;  Surgeon: Early Osmond, MD;  Location: [place] CV LAB;  Service: Cardiovascular;  Laterality: N/A;  ? RIGHT/LEFT HEART CATH AND CORONARY ANGIOGRAPHY N/A 05/13/2019  ? Procedure: RIGHT/LEFT HEART CATH AND CORONARY ANGIOGRAPHY;  Surgeon: Martinique, Audwin Semper M, MD;  Location: [place] CV LAB;  Service: Cardiovascular;  Laterality: N/A;  ? RIGHT/LEFT HEART CATH AND CORONARY ANGIOGRAPHY N/A 07/23/2021  ? Procedure: RIGHT/LEFT HEART CATH AND CORONARY ANGIOGRAPHY;  Surgeon: Jettie Booze, MD;  Location: [place] CV LAB;  Service: Cardiovascular;  Laterality: N/A;  ? TONSILLECTOMY    ? TRANSCATHETER AORTIC VALVE REPLACEMENT, TRANSFEMORAL N/A 08/03/2021  ? Procedure: TRANSCATHETER AORTIC VALVE REPLACEMENT, TRANSFEMORAL;  Surgeon: Early Osmond, MD;  Location: [place] CV LAB;  Service: Open Heart Surgery;  Laterality: N/A;  ? UPPER GI ENDOSCOPY    ? ? ?Current Medications: ?Current Meds  ?Medication Sig  ? acetaminophen (TYLENOL) 500 MG tablet Take 500-1,000 mg by mouth every 6 (six) hours as needed for moderate pain.  ? albuterol (VENTOLIN HFA) 108 (90 Base) MCG/ACT inhaler Inhale 2  puffs into the lungs every 4 (four) hours as needed for wheezing or shortness of breath (seasonal allergies).   ? apixaban (ELIQUIS) 2.5 MG TABS tablet Take 1 tablet (2.5 mg total) by mouth 2 (two) times daily.  ? Cholecalciferol (VITAMIN D3 PO) Take 1 tablet by mouth daily.  ? diltiazem (CARDIZEM CD) 180 MG 24 hr capsule Take 180 mg by mouth as needed (Elevated heart rate).  ? docusate sodium (COLACE) 100 MG capsule Take 100 mg by mouth daily with breakfast.  ? escitalopram (LEXAPRO) 5 MG tablet Take 1 tablet (5 mg total) by mouth daily.  ? fexofenadine (ALLEGRA) 60 MG tablet Take 60 mg by mouth daily.  ? furosemide (LASIX) 20 MG tablet Take 0.5 tablets (10 mg total) by mouth 2 (two) times daily.  ? lisinopril (ZESTRIL) 5 MG tablet Take 5 mg by mouth daily.  ? melatonin 5 MG TABS Take 5 mg by mouth at bedtime.  ? metoprolol tartrate (LOPRESSOR) 25 MG tablet Take 1 tablet (25 mg total) by mouth 2 (two) times daily.  ? montelukast (SINGULAIR)  10 MG tablet Take 1 tablet (10 mg total) by mouth daily after supper.  ? Multiple Vitamin (MULTIVITAMIN WITH MINERALS) TABS tablet Take 1 tablet by mouth daily. Centrum 50 plus  ? Multiple Vitamins-Minerals (EMERGEN-C VITAMIN C PO) Take 1 packet by mouth.  ? Omega-3 Fatty Acids (FISH OIL PO) Take 1 capsule by mouth daily.   ? omeprazole (PRILOSEC) 20 MG capsule Take 20 mg by mouth daily before breakfast.  ? rosuvastatin (CRESTOR) 5 MG tablet Take 1 tablet (5 mg total) by mouth daily.  ? Trolamine Salicylate (ASPERCREME EX) Apply 1 application topically daily as needed (knee pain).  ?  ? ?Allergies:   Codeine, Cyclobenzaprine, Iodinated contrast media, and Prednisone  ? ?Social History  ? ?Socioeconomic History  ? Marital status: Divorced  ?  Spouse name: Not on file  ? Number of children: Not on file  ? Years of education: Not on file  ? Highest education level: Not on file  ?Occupational History  ? Not on file  ?Tobacco Use  ? Smoking status: Former  ?  Packs/day: 1.00  ?   Years: 20.00  ?  Pack years: 20.00  ?  Types: Cigarettes  ? Smokeless tobacco: Never  ? Tobacco comments:  ?  Quit 30 yrs ago as of 07/2021 per daughter  ?Vaping Use  ? Vaping Use: Never used  ?Substance and Sexual

## 2021-11-16 ENCOUNTER — Other Ambulatory Visit: Payer: Self-pay | Admitting: Family Medicine

## 2021-11-16 MED ORDER — MONTELUKAST SODIUM 10 MG PO TABS: 10.0000 mg | ORAL_TABLET | Freq: Every day | ORAL | 3 refills | Status: DC

## 2021-11-17 ENCOUNTER — Ambulatory Visit: Payer: Medicare HMO | Admitting: Cardiology

## 2021-11-17 ENCOUNTER — Encounter: Payer: Self-pay | Admitting: Cardiology

## 2021-11-17 VITALS — BP 138/60 | HR 63 | Ht 60.0 in | Wt 118.6 lb

## 2021-11-17 DIAGNOSIS — I5032 Chronic diastolic (congestive) heart failure: Secondary | ICD-10-CM | POA: Diagnosis not present

## 2021-11-17 DIAGNOSIS — I3139 Other pericardial effusion (noninflammatory): Secondary | ICD-10-CM | POA: Diagnosis not present

## 2021-11-17 DIAGNOSIS — I480 Paroxysmal atrial fibrillation: Secondary | ICD-10-CM | POA: Diagnosis not present

## 2021-11-17 DIAGNOSIS — I10 Essential (primary) hypertension: Secondary | ICD-10-CM

## 2021-11-17 DIAGNOSIS — Z952 Presence of prosthetic heart valve: Secondary | ICD-10-CM

## 2021-11-17 DIAGNOSIS — I350 Nonrheumatic aortic (valve) stenosis: Secondary | ICD-10-CM

## 2021-11-17 DIAGNOSIS — I6529 Occlusion and stenosis of unspecified carotid artery: Secondary | ICD-10-CM

## 2021-12-14 ENCOUNTER — Encounter: Payer: Self-pay | Admitting: Family Medicine

## 2021-12-14 ENCOUNTER — Ambulatory Visit (INDEPENDENT_AMBULATORY_CARE_PROVIDER_SITE_OTHER): Payer: Medicare HMO | Admitting: Family Medicine

## 2021-12-14 VITALS — BP 128/76 | HR 64 | Temp 96.8°F | Ht 60.0 in | Wt 122.1 lb

## 2021-12-14 DIAGNOSIS — F419 Anxiety disorder, unspecified: Secondary | ICD-10-CM | POA: Diagnosis not present

## 2021-12-14 DIAGNOSIS — J471 Bronchiectasis with (acute) exacerbation: Secondary | ICD-10-CM | POA: Diagnosis not present

## 2021-12-14 DIAGNOSIS — I10 Essential (primary) hypertension: Secondary | ICD-10-CM

## 2021-12-14 DIAGNOSIS — E785 Hyperlipidemia, unspecified: Secondary | ICD-10-CM | POA: Diagnosis not present

## 2021-12-14 DIAGNOSIS — I480 Paroxysmal atrial fibrillation: Secondary | ICD-10-CM

## 2021-12-14 NOTE — Progress Notes (Signed)
Provider:  Alain Honey, MD  Careteam: Patient Care Team: Wardell Honour, MD as PCP - General (Family Medicine) Martinique, Peter M, MD as PCP - Cardiology (Cardiology) Charlaine Dalton, MD as Referring Physician  PLACE OF SERVICE:  [place]  Advanced Directive information    Allergies  Allergen Reactions   Codeine Nausea And Vomiting and Nausea Only   Cyclobenzaprine    Iodinated Contrast Media    Prednisone Other (See Comments)    Sores in mouth    Chief Complaint  Patient presents with   Medical Management of Chronic Issues    Patient presents today for a 3 month follow-up.   Quality Metric Gaps    DEXA scan and zoster     HPI: Patient is a 87 y.o. female patient is here to follow-up chronic medical problems including atrial fibrillation bronchiectasis, anxiety, hyperlipidemia, and hypertension.  Patient has actually been doing well.  Saw cardiologist since her last visit here and by history he was pleased with her progress.  She continues to live alone and drive.  Daughter accompanies her today and has noted no decline in memory. At last visit we restarted Lexapro for anxiety with agreement to follow serum sodiums.  1 month ago sodium was 139.  We will plan to assess this regularly but it at least 3-4 if not 2-month intervals.  She reduced on her own her lisinopril.  Now takes 2.5 mg.  Blood pressure has been good and she thinks she has less postural hypotension.  Review of Systems:  Review of Systems  Constitutional: Negative.   HENT: Negative.    Respiratory: Negative.    Cardiovascular: Negative.   Genitourinary: Negative.   Neurological: Negative.   Psychiatric/Behavioral: Negative.    All other systems reviewed and are negative.  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 Ali Lowe & Dr. Cyndia Bent   Seasonal allergies    Takotsubo cardiomyopathy    Past Surgical History:  Procedure Laterality Date   COLONOSCOPY     EYE SURGERY Bilateral    cataracts removed   INTRAOPERATIVE TRANSTHORACIC ECHOCARDIOGRAM N/A 08/03/2021   Procedure: INTRAOPERATIVE TRANSTHORACIC ECHOCARDIOGRAM;  Surgeon: Early Osmond, MD;  Location: [facility];  Service: Open Heart Surgery;  Laterality: N/A;   PERICARDIOCENTESIS N/A 07/28/2021   Procedure: PERICARDIOCENTESIS;  Surgeon: Early Osmond, MD;  Location: [place] [facility];  Service: Cardiovascular;  Laterality: N/A;   RIGHT/LEFT HEART CATH AND CORONARY ANGIOGRAPHY N/A 05/13/2019   Procedure: RIGHT/LEFT HEART CATH AND CORONARY ANGIOGRAPHY;  Surgeon: Martinique, Peter M, MD;  Location: [facility];  Service: Cardiovascular;  Laterality: N/A;   RIGHT/LEFT HEART CATH AND CORONARY ANGIOGRAPHY N/A 07/23/2021   Procedure: RIGHT/LEFT HEART CATH AND CORONARY ANGIOGRAPHY;  Surgeon: Jettie Booze, MD;  Location: [place];  Service: Cardiovascular;  Laterality: N/A;   TONSILLECTOMY     TRANSCATHETER  AORTIC VALVE REPLACEMENT, TRANSFEMORAL N/A 08/03/2021   Procedure: TRANSCATHETER AORTIC VALVE REPLACEMENT, TRANSFEMORAL;  Surgeon: Early Osmond, MD;  Location: [place] [facility];  Service: Open Heart Surgery;  Laterality: N/A;   UPPER GI ENDOSCOPY     Social History:   reports that she has quit smoking. Her smoking use included cigarettes. She has a 20.00 pack-year smoking history. She has never used smokeless tobacco. She reports current alcohol use. She reports that she does not use drugs.  Family History  Problem Relation  Age of Onset   Cancer Sister    Lung cancer Sister    Alcohol abuse Sister    Melanoma Brother    Brain cancer Brother    Heart failure Brother    Diabetes Brother    Cancer Brother    Heart Problems Brother    Down syndrome Brother    Heart failure Brother    Endometriosis Daughter    Hypertension Other     Medications: Patient's Medications  New Prescriptions   No medications on file  Previous Medications   ACETAMINOPHEN (TYLENOL) 500 MG TABLET    Take 500-1,000 mg by mouth every 6 (six) hours as needed for moderate pain.   ALBUTEROL (VENTOLIN HFA) 108 (90 BASE) MCG/ACT INHALER    Inhale 2 puffs into the lungs every 4 (four) hours as needed for wheezing or shortness of breath (seasonal allergies).    APIXABAN (ELIQUIS) 2.5 MG TABS TABLET    Take 1 tablet (2.5 mg total) by mouth 2 (two) times daily.   CHOLECALCIFEROL (VITAMIN D3 PO)    Take 1 tablet by mouth daily.   DILTIAZEM (CARDIZEM CD) 180 MG 24 HR CAPSULE    Take 180 mg by mouth as needed (Elevated heart rate).   DOCUSATE SODIUM (COLACE) 100 MG CAPSULE    Take 100 mg by mouth daily with breakfast.   ESCITALOPRAM (LEXAPRO) 5 MG TABLET    Take 1 tablet (5 mg total) by mouth daily.   FEXOFENADINE (ALLEGRA) 60 MG TABLET    Take 60 mg by mouth daily.   FUROSEMIDE (LASIX) 20 MG TABLET    Take 0.5 tablets (10 mg total) by mouth 2 (two) times daily.   LISINOPRIL (ZESTRIL) 5 MG TABLET    Take 2.5 mg by mouth daily.   MELATONIN 5 MG TABS    Take 5 mg by mouth at bedtime.   METOPROLOL TARTRATE (LOPRESSOR) 25 MG TABLET    Take 1 tablet (25 mg total) by mouth 2 (two) times daily.   MONTELUKAST (SINGULAIR) 10 MG TABLET    Take 1 tablet (10 mg total) by mouth daily after supper.   MULTIPLE VITAMIN (MULTIVITAMIN WITH MINERALS) TABS TABLET    Take 1 tablet by mouth daily. Centrum 50 plus   MULTIPLE VITAMINS-MINERALS (EMERGEN-C VITAMIN C PO)    Take 1 packet by mouth.   OMEGA-3 FATTY ACIDS (FISH OIL PO)    Take 1 capsule by mouth daily.     OMEPRAZOLE (PRILOSEC) 20 MG CAPSULE    Take 20 mg by mouth daily before breakfast.   ROSUVASTATIN (CRESTOR) 5 MG TABLET    Take 1 tablet (5 mg total) by mouth daily.   TROLAMINE SALICYLATE (ASPERCREME EX)    Apply 1 application topically daily as needed (knee pain).  Modified Medications   No medications on file  Discontinued Medications   LISINOPRIL (ZESTRIL) 5 MG TABLET    Take 5 mg by mouth daily.    Physical Exam:  Vitals:   12/14/21 1055  BP: 128/76  Pulse: 64  Temp: (!) 96.8 F (36 C)  SpO2: 97%  Weight: 122 lb 1.6 oz (55.4 kg)  Height: 5' (1.524 m)   Body mass index is 23.85 kg/m. Wt Readings from Last 3 Encounters:  12/14/21 122 lb 1.6 oz (55.4 kg)  11/17/21 118 lb 9.6 oz (53.8 kg)  11/10/21 117 lb 9.6 oz (53.3 kg)    Physical Exam Vitals and nursing note reviewed.  Constitutional:      Appearance: Normal appearance.  Cardiovascular:     Rate and Rhythm: Normal rate and regular rhythm.  Pulmonary:     Effort: Pulmonary effort is normal.     Breath sounds: Normal breath sounds.  Musculoskeletal:        General: Normal range of motion.  Neurological:     General: No focal deficit present.     Mental Status: She is alert and oriented to person, place, and time.    Labs reviewed: Basic Metabolic Panel: Recent Labs    07/21/21 1948 07/22/21 0022 07/22/21 0421 07/23/21 0745 07/30/21 0128 08/03/21 1347 08/03/21 1542 08/04/21 0452 10/26/21 1625  NA 128*  --  131*   < > 125*   < > 131* 133* 139  K 4.2  --  3.9   < > 4.0   < > 4.3 4.4 4.3  CL 93*  --  93*   < > 91*   < > 99 101 103  CO2 27  --  28   < > 26  --   --  22 29  GLUCOSE 112*  --  109*   < > 99   < > 146* 134* 104  BUN 20  --  21   < > 20   < > 25* 29* 22  CREATININE 0.93  --  1.09*   < > 1.06*   < > 1.00 1.15* 1.01*  CALCIUM 8.9  --  9.1   < > 8.7*  --   --  8.9 9.8  MG 2.1  --  2.2  --   --   --   --  2.1  --   PHOS  --  3.0 3.4  --   --   --   --   --   --   TSH  --  1.763 1.472  --   --    --   --   --   --    < > = values in this interval not displayed.   Liver Function Tests: Recent Labs    07/22/21 0421 07/28/21 0716 07/30/21 0128  AST 70* 18 15  ALT 99* 31 22  ALKPHOS 175* 82 66  BILITOT 0.9 0.7 0.4  PROT 6.6 5.9* 5.7*  ALBUMIN 3.2* 2.7* 2.6*   No results for input(s): LIPASE, AMYLASE in the last 8760 hours. No results for input(s): AMMONIA in the last 8760 hours. CBC: Recent Labs    07/21/21 1948 07/22/21 0421 07/23/21 0937 07/30/21 0128 08/03/21 1347 08/03/21 1542 08/04/21 0452 10/26/21 1625  WBC 8.4 7.5   < > 8.9  --   --  12.3* 6.6  NEUTROABS 5.8 4.7  --   --   --   --   --  2,944  HGB 11.5* 11.3*   < > 11.4*   < > 10.2* 11.0* 11.6*  HCT 35.6* 33.6*   < > 34.0*   < > 30.0* 34.5* 36.5  MCV 91.8  90.8   < > 88.5  --   --  91.8 91.3  PLT 267 275   < > 394  --   --  387 199   < > = values in this interval not displayed.   Lipid Panel: No results for input(s): CHOL, HDL, LDLCALC, TRIG, CHOLHDL, LDLDIRECT in the last 8760 hours. TSH: Recent Labs    07/22/21 0022 07/22/21 0421  TSH 1.763 1.472   A1C: No results found for: HGBA1C   Assessment/Plan  1. Anxiety   2. Bronchiectasis with acute exacerbation ([place]) Attends exercise classes regularly denies any shortness of breath  3. Dyslipidemia, goal LDL below 70   4. Primary hypertension BP goof on triple drug regimen  5. PAF (paroxysmal atrial fibrillation) ([place]) NSR today   Alain Honey, MD [facility] [phone]

## 2022-01-18 ENCOUNTER — Encounter: Payer: Self-pay | Admitting: Family Medicine

## 2022-01-18 DIAGNOSIS — E785 Hyperlipidemia, unspecified: Secondary | ICD-10-CM

## 2022-01-19 ENCOUNTER — Other Ambulatory Visit: Payer: Self-pay | Admitting: Family Medicine

## 2022-01-19 DIAGNOSIS — E785 Hyperlipidemia, unspecified: Secondary | ICD-10-CM

## 2022-01-19 MED ORDER — ROSUVASTATIN CALCIUM 5 MG PO TABS: 5.0000 mg | ORAL_TABLET | Freq: Every day | ORAL | 11 refills | Status: DC

## 2022-01-19 MED ORDER — ROSUVASTATIN CALCIUM 5 MG PO TABS: 5.0000 mg | ORAL_TABLET | Freq: Every day | ORAL | 1 refills | Status: DC

## 2022-01-19 NOTE — Telephone Encounter (Signed)
Patient called requesting crestor. She says she takes it twice a day (5mg ). Although she is open to taking it just once a day.  Received warning message of "HIGH DOSE"  To Dr. 

## 2022-02-07 ENCOUNTER — Ambulatory Visit (INDEPENDENT_AMBULATORY_CARE_PROVIDER_SITE_OTHER): Payer: Medicare HMO | Admitting: Family

## 2022-02-07 ENCOUNTER — Encounter: Payer: Self-pay | Admitting: Family

## 2022-02-07 VITALS — BP 126/60 | HR 80 | Temp 97.3°F | Resp 18 | Ht 60.0 in | Wt 122.0 lb

## 2022-02-07 DIAGNOSIS — R350 Frequency of micturition: Secondary | ICD-10-CM

## 2022-02-07 LAB — POCT URINALYSIS DIPSTICK
Bilirubin, UA: NEGATIVE
Blood, UA: NEGATIVE
Glucose, UA: NEGATIVE
Ketones, UA: NEGATIVE
Leukocytes, UA: NEGATIVE
Nitrite, UA: NEGATIVE
Protein, UA: NEGATIVE
Spec Grav, UA: <=1.005 — AB (ref 1.010–1.025)
Urobilinogen, UA: NEGATIVE E.U./dL — AB
pH, UA: 6 (ref 5.0–8.0)

## 2022-02-07 NOTE — Progress Notes (Signed)
Provider: Jaelynne Hockley FNP-C  Frederica Kuster, MD  Patient Care Team: Frederica Kuster, MD as PCP - General (Family Medicine) [place], Peter M, MD as PCP - Cardiology (Cardiology) Marisue Brooklyn, MD as Referring Physician  Extended Emergency Contact Information Primary Emergency Contact: [facility] Address: [address] [place], [place] [address] D[place] of [place] Mobile Phone: [phone] Relation: Daughter  Code Status: Full Code  Goals of care: Advanced Directive information    10/26/2021    3:06 PM  Advanced Directives  Does Patient Have a Medical Advance Directive? Yes  Type of Estate agent of [place];Living will  Does patient want to make changes to medical advance directive? No - Patient declined  Copy of Healthcare Power of Attorney in Chart? Yes - validated most recent copy scanned in chart (See row information)     Chief Complaint  Patient presents with   Acute Visit    Patient is here for possible UTI, urinary frequency and order . Just had 2000 mg of amoxicillin before dentist on Tuesday    HPI:  Pt is a 87 y.o. female seen today for an acute visit for evaluation of symptoms of UTI.Has had increased frequency and strong odor.Urine also feels hot at times.denies any fever,chills,nausea,vomiting,abdominal pain,flank pain,urgency,dysuria,difficult urination or hematuria. States took Amoxicillin 2000 mg on Tuesday prior to dental procedure wonders whether antibiotics took care of the infection.  Drinks plenty of fluid as advised by cardiology.   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 Lynnette Caffey & Dr. Laneta Simmers   Seasonal allergies    Takotsubo cardiomyopathy    Past Surgical History:  Procedure Laterality Date   COLONOSCOPY     EYE SURGERY Bilateral    cataracts removed   INTRAOPERATIVE TRANSTHORACIC ECHOCARDIOGRAM N/A 08/03/2021   Procedure: INTRAOPERATIVE TRANSTHORACIC ECHOCARDIOGRAM;  Surgeon: Orbie Pyo, MD;  Location: [facility];  Service: Open Heart Surgery;  Laterality: N/A;   PERICARDIOCENTESIS N/A 07/28/2021   Procedure: PERICARDIOCENTESIS;  Surgeon: Orbie Pyo, MD;  Location: [facility];  Service: Cardiovascular;  Laterality: N/A;   RIGHT/LEFT HEART CATH AND CORONARY ANGIOGRAPHY N/A 05/13/2019   Procedure: RIGHT/LEFT HEART CATH AND CORONARY ANGIOGRAPHY;  Surgeon: Swaziland, Peter M, MD;  Location: [facility];  Service: Cardiovascular;  Laterality: N/A;   RIGHT/LEFT HEART CATH AND CORONARY ANGIOGRAPHY N/A 07/23/2021   Procedure: RIGHT/LEFT HEART CATH AND CORONARY ANGIOGRAPHY;  Surgeon: Corky Crafts, MD;  Location: [facility];  Service: Cardiovascular;  Laterality: N/A;   TONSILLECTOMY     TRANSCATHETER AORTIC VALVE REPLACEMENT, TRANSFEMORAL N/A 08/03/2021   Procedure: TRANSCATHETER AORTIC VALVE REPLACEMENT, TRANSFEMORAL;  Surgeon: Orbie Pyo, MD;  Location: [facility];  Service: Open Heart Surgery;  Laterality: N/A;   UPPER GI ENDOSCOPY      Allergies  Allergen Reactions   Codeine Nausea And Vomiting and Nausea Only   Cyclobenzaprine    Iodinated Contrast Media    Prednisone Other (See Comments)    Sores in mouth    Outpatient Encounter Medications as of 02/07/2022  Medication Sig   acetaminophen (TYLENOL) 500 MG tablet Take 500-1,000 mg by mouth every 6 (six) hours as needed for moderate pain.   albuterol (VENTOLIN HFA) 108 (90 Base) MCG/ACT inhaler  Inhale 2 puffs into the lungs every 4 (four) hours as needed for wheezing or shortness of breath (seasonal allergies).    apixaban (ELIQUIS) 2.5 MG TABS tablet Take 1 tablet (2.5 mg total) by mouth 2 (two) times daily.   Cholecalciferol (VITAMIN D3 PO) Take 1 tablet by mouth daily.   diltiazem (CARDIZEM CD) 180 MG 24 hr capsule Take 180 mg by mouth as needed (Elevated heart rate).   docusate sodium (COLACE) 100 MG capsule Take 100 mg by mouth daily with breakfast.   escitalopram (LEXAPRO) 5 MG tablet Take 1 tablet (5 mg total) by mouth daily.   fexofenadine (ALLEGRA) 60 MG tablet Take 60 mg by mouth daily.   furosemide (LASIX) 20 MG tablet Take 0.5 tablets (10 mg total) by mouth 2 (two) times daily.   lisinopril (ZESTRIL) 5 MG tablet Take 1.25 mg by mouth daily.   melatonin 5 MG TABS Take 5 mg by mouth at bedtime.   metoprolol tartrate (LOPRESSOR) 25 MG tablet Take 1 tablet (25 mg total) by mouth 2 (two) times daily.   montelukast (SINGULAIR) 10 MG tablet Take 1 tablet (10 mg total) by mouth daily after supper.   Multiple Vitamin (MULTIVITAMIN WITH MINERALS) TABS tablet Take 1 tablet by mouth daily. Centrum 50 plus   Multiple Vitamins-Minerals (EMERGEN-C VITAMIN C PO) Take 1 packet by mouth.   Omega-3 Fatty Acids (FISH OIL PO) Take 1 capsule by mouth daily.    omeprazole (PRILOSEC) 20 MG capsule Take 20 mg by mouth daily before breakfast.   rosuvastatin (CRESTOR) 5 MG tablet Take 1 tablet (5 mg total) by mouth daily.   Trolamine Salicylate (ASPERCREME EX) Apply 1 application topically daily as needed (knee pain).   No facility-administered encounter medications on file as of 02/07/2022.    Review of Systems  Constitutional:  Negative for chills, fatigue and fever.  Gastrointestinal:  Negative for abdominal distention, abdominal pain, constipation, diarrhea, nausea and vomiting.  Genitourinary:  Positive for frequency. Negative for decreased urine volume, difficulty urinating, dysuria, flank  pain, hematuria and urgency.  Musculoskeletal:  Negative for arthralgias, back pain and gait problem.  Psychiatric/Behavioral:  Negative for agitation, confusion and sleep disturbance. The patient is not nervous/anxious.     Immunization History  Administered Date(s) Administered   Fluad Quad(high Dose 65+) 04/07/2019   Influenza Split 05/10/2006, 04/25/2007, 04/08/2008, 11/10/2008   Influenza, High Dose Seasonal PF 04/20/2017, 04/07/2019, 03/25/2020, 04/27/2021   Influenza-Unspecified 05/10/2006, 04/25/2007, 04/08/2008, 07/09/2008, 11/10/2008, 03/31/2015, 04/20/2018, 04/07/2019   PFIZER Comirnaty(Gray Top)Covid-19 Tri-Sucrose Vaccine 08/14/2019, 09/04/2019, 04/20/2020, 10/25/2020, 04/12/2021   Pneumococcal Conjugate-13 09/29/2015   Pneumococcal Polysaccharide-23 03/12/2007   Tdap 07/14/2014   Zoster Recombinat (Shingrix) 12/10/2019   Zoster, Live 08/18/2004, 01/17/2007   Pertinent  Health Maintenance Due  Topic Date Due   DEXA SCAN  Never done   INFLUENZA VACCINE  02/22/2022      08/03/2021   10:12 AM 08/03/2021    8:00 PM 08/04/2021   11:02 AM 10/26/2021    3:06 PM 11/10/2021    9:58 AM  Fall  Risk  Falls in the past year?    0 0  Was there an injury with Fall?    0 0  Fall Risk Category Calculator    0 0  Fall Risk Category    Low Low  Patient Fall Risk Level Low fall risk Moderate fall risk Moderate fall risk Low fall risk Low fall risk  Patient at Risk for Falls Due to    No Fall Risks No Fall Risks  Fall risk Follow up    Falls evaluation completed Falls evaluation completed   Functional Status Survey:    Vitals:   02/07/22 1448  BP: 126/60  Pulse: 80  Resp: 18  Temp: (!) 97.3 F (36.3 C)  TempSrc: Temporal  SpO2: 96%  Weight: 122 lb (55.3 kg)  Height: 5' (1.524 m)   Body mass index is 23.83 kg/m. Physical Exam Vitals reviewed.  Constitutional:      General: She is not in acute distress.    Appearance: Normal appearance. She is normal weight. She is not  ill-appearing or diaphoretic.  HENT:     Mouth/Throat:     Mouth: Mucous membranes are moist.     Pharynx: Oropharynx is clear. No oropharyngeal exudate or posterior oropharyngeal erythema.  Cardiovascular:     Rate and Rhythm: Normal rate and regular rhythm.     Pulses: Normal pulses.     Heart sounds: Murmur heard.     No friction rub. No gallop.  Pulmonary:     Effort: Pulmonary effort is normal. No respiratory distress.     Breath sounds: Normal breath sounds. No wheezing, rhonchi or rales.  Chest:     Chest wall: No tenderness.  Abdominal:     General: Bowel sounds are normal. There is no distension.     Palpations: Abdomen is soft. There is no mass.     Tenderness: There is no abdominal tenderness. There is no right CVA tenderness, left CVA tenderness, guarding or rebound.  Skin:    General: Skin is warm and dry.     Coloration: Skin is not pale.     Findings: No erythema.  Neurological:     Mental Status: She is alert and oriented to person, place, and time.     Motor: No weakness.     Gait: Gait normal.  Psychiatric:        Mood and Affect: Mood normal.        Speech: Speech normal.        Behavior: Behavior normal.     Labs reviewed: Recent Labs    07/21/21 1948 07/22/21 0022 07/22/21 0421 07/23/21 0745 07/30/21 0128 08/03/21 1347 08/03/21 1542 08/04/21 0452 10/26/21 1625  NA 128*  --  131*   < > 125*   < > 131* 133* 139  K 4.2  --  3.9   < > 4.0   < > 4.3 4.4 4.3  CL 93*  --  93*   < > 91*   < > 99 101 103  CO2 27  --  28   < > 26  --   --  22 29  GLUCOSE 112*  --  109*   < > 99   < > 146* 134* 104  BUN 20  --  21   < > 20   < > 25* 29* 22  CREATININE 0.93  --  1.09*   < > 1.06*   < > 1.00 1.15* 1.01*  CALCIUM 8.9  --  9.1   < > 8.7*  --   --  8.9 9.8  MG 2.1  --  2.2  --   --   --   --  2.1  --   PHOS  --  3.0 3.4  --   --   --   --   --   --    < > = values in this interval not displayed.   Recent Labs    07/22/21 0421 07/28/21 0716  07/30/21 0128  AST 70* 18 15  ALT 99* 31 22  ALKPHOS 175* 82 66  BILITOT 0.9 0.7 0.4  PROT 6.6 5.9* 5.7*  ALBUMIN 3.2* 2.7* 2.6*   Recent Labs    07/21/21 1948 07/22/21 0421 07/23/21 0937 07/30/21 0128 08/03/21 1347 08/03/21 1542 08/04/21 0452 10/26/21 1625  WBC 8.4 7.5   < > 8.9  --   --  12.3* 6.6  NEUTROABS 5.8 4.7  --   --   --   --   --  2,944  HGB 11.5* 11.3*   < > 11.4*   < > 10.2* 11.0* 11.6*  HCT 35.6* 33.6*   < > 34.0*   < > 30.0* 34.5* 36.5  MCV 91.8 90.8   < > 88.5  --   --  91.8 91.3  PLT 267 275   < > 394  --   --  387 199   < > = values in this interval not displayed.   Lab Results  Component Value Date   TSH 1.472 07/22/2021   No results found for: "HGBA1C" No results found for: "CHOL", "HDL", "LDLCALC", "LDLDIRECT", "TRIG", "CHOLHDL"  Significant Diagnostic Results in last 30 days:  No results found.  Assessment/Plan  Urinary frequency Afebrile - POC Urinalysis Dipstick indicates normal results.  We will send urine for culture due to presenting symptoms. Made aware results will return in 3 days.Recommended cranberry tablets 1 by mouth twice daily - Culture, Urine  Family/ staff Communication: Reviewed plan of care with patient and daughter verbalized understanding  Labs/tests ordered:  - POC Urinalysis Dipstick  - Urine Culture  Next Appointment: Return if symptoms worsen or fail to improve.   Caesar Bookman, NP

## 2022-02-07 NOTE — Patient Instructions (Signed)
Urinary Tract Infection, Adult  A urinary tract infection (UTI) is an infection of any part of the urinary tract. The urinary tract includes the kidneys, ureters, bladder, and urethra. These organs make, store, and get rid of urine in the body. An upper UTI affects the ureters and kidneys. A lower UTI affects the bladder and urethra. What are the causes? Most urinary tract infections are caused by bacteria in your genital area around your urethra, where urine leaves your body. These bacteria grow and cause inflammation of your urinary tract. What increases the risk? You are more likely to develop this condition if: You have a urinary catheter that stays in place. You are not able to control when you urinate or have a bowel movement (incontinence). You are female and you: Use a spermicide or diaphragm for birth control. Have low estrogen levels. Are pregnant. You have certain genes that increase your risk. You are sexually active. You take antibiotic medicines. You have a condition that causes your flow of urine to slow down, such as: An enlarged prostate, if you are female. Blockage in your urethra. A kidney stone. A nerve condition that affects your bladder control (neurogenic bladder). Not getting enough to drink, or not urinating often. You have certain medical conditions, such as: Diabetes. A weak disease-fighting system (immunesystem). Sickle cell disease. Gout. Spinal cord injury. What are the signs or symptoms? Symptoms of this condition include: Needing to urinate right away (urgency). Frequent urination. This may include small amounts of urine each time you urinate. Pain or burning with urination. Blood in the urine. Urine that smells bad or unusual. Trouble urinating. Cloudy urine. Vaginal discharge, if you are female. Pain in the abdomen or the lower back. You may also have: Vomiting or a decreased appetite. Confusion. Irritability or tiredness. A fever or  chills. Diarrhea. The first symptom in older adults may be confusion. In some cases, they may not have any symptoms until the infection has worsened. How is this diagnosed? This condition is diagnosed based on your medical history and a physical exam. You may also have other tests, including: Urine tests. Blood tests. Tests for STIs (sexually transmitted infections). If you have had more than one UTI, a cystoscopy or imaging studies may be done to determine the cause of the infections. How is this treated? Treatment for this condition includes: Antibiotic medicine. Over-the-counter medicines to treat discomfort. Drinking enough water to stay hydrated. If you have frequent infections or have other conditions such as a kidney stone, you may need to see a health care provider who specializes in the urinary tract (urologist). In rare cases, urinary tract infections can cause sepsis. Sepsis is a life-threatening condition that occurs when the body responds to an infection. Sepsis is treated in the hospital with IV antibiotics, fluids, and other medicines. Follow these instructions at home:  Medicines Take over-the-counter and prescription medicines only as told by your health care provider. If you were prescribed an antibiotic medicine, take it as told by your health care provider. Do not stop using the antibiotic even if you start to feel better. General instructions Make sure you: Empty your bladder often and completely. Do not hold urine for long periods of time. Empty your bladder after sex. Wipe from front to back after urinating or having a bowel movement if you are female. Use each tissue only one time when you wipe. Drink enough fluid to keep your urine pale yellow. Keep all follow-up visits. This is important. Contact a health   care provider if: Your symptoms do not get better after 1-2 days. Your symptoms go away and then return. Get help right away if: You have severe pain in  your back or your lower abdomen. You have a fever or chills. You have nausea or vomiting. Summary A urinary tract infection (UTI) is an infection of any part of the urinary tract, which includes the kidneys, ureters, bladder, and urethra. Most urinary tract infections are caused by bacteria in your genital area. Treatment for this condition often includes antibiotic medicines. If you were prescribed an antibiotic medicine, take it as told by your health care provider. Do not stop using the antibiotic even if you start to feel better. Keep all follow-up visits. This is important. This information is not intended to replace advice given to you by your health care provider. Make sure you discuss any questions you have with your health care provider. Document Revised: 02/21/2020 Document Reviewed: 02/21/2020 Elsevier Patient Education  2023 Elsevier Inc.  

## 2022-02-08 ENCOUNTER — Other Ambulatory Visit: Payer: Self-pay | Admitting: Cardiology

## 2022-02-09 LAB — URINALYSIS
Bilirubin Urine: NEGATIVE
Glucose, UA: NEGATIVE
Hgb urine dipstick: NEGATIVE
Ketones, ur: NEGATIVE
Nitrite: NEGATIVE
Protein, ur: NEGATIVE
Specific Gravity, Urine: 1.009 (ref 1.001–1.035)
pH: 5.5 (ref 5.0–8.0)

## 2022-02-09 LAB — URINE CULTURE
MICRO NUMBER:: 13656120
SPECIMEN QUALITY:: ADEQUATE

## 2022-02-10 NOTE — Telephone Encounter (Signed)
If still having symptoms can treat with antibiotics.

## 2022-02-10 NOTE — Telephone Encounter (Signed)
Message routed to Dinah Ngetich, NP. 

## 2022-02-15 ENCOUNTER — Encounter: Payer: Self-pay | Admitting: Family Medicine

## 2022-02-15 ENCOUNTER — Ambulatory Visit (INDEPENDENT_AMBULATORY_CARE_PROVIDER_SITE_OTHER): Payer: Medicare HMO | Admitting: Family Medicine

## 2022-02-15 VITALS — BP 148/82 | HR 61 | Temp 98.2°F | Ht 60.0 in | Wt 122.4 lb

## 2022-02-15 DIAGNOSIS — A499 Bacterial infection, unspecified: Secondary | ICD-10-CM

## 2022-02-15 DIAGNOSIS — I10 Essential (primary) hypertension: Secondary | ICD-10-CM

## 2022-02-15 DIAGNOSIS — I480 Paroxysmal atrial fibrillation: Secondary | ICD-10-CM

## 2022-02-15 DIAGNOSIS — F419 Anxiety disorder, unspecified: Secondary | ICD-10-CM

## 2022-02-15 DIAGNOSIS — N390 Urinary tract infection, site not specified: Secondary | ICD-10-CM

## 2022-02-15 DIAGNOSIS — E785 Hyperlipidemia, unspecified: Secondary | ICD-10-CM | POA: Diagnosis not present

## 2022-02-15 MED ORDER — CIPROFLOXACIN HCL 250 MG PO TABS: 250.0000 mg | ORAL_TABLET | Freq: Two times a day (BID) | ORAL | 0 refills | Status: AC

## 2022-02-15 NOTE — Progress Notes (Signed)
Provider:  Jacalyn Lefevre, MD  Careteam: Patient Care Team: Frederica Kuster, MD as PCP - General (Family Medicine) [place], Peter M, MD as PCP - Cardiology (Cardiology) Marisue Brooklyn, MD as Referring Physician  PLACE OF SERVICE:  [facility]  Advanced Directive information    Allergies  Allergen Reactions   Codeine Nausea And Vomiting and Nausea Only   Cyclobenzaprine    Iodinated Contrast Media    Prednisone Other (See Comments)    Sores in mouth    No chief complaint on file.    HPI: Patient is a 87 y.o. female patient is here for follow-up of chronic problems including atherosclerosis of aorta, anxiety, hyperlipidemia, hypertension, and cirrhosis of the liver.  She has no specific complaints today.  She was seen last week for urinary tract symptoms and subsequent culture has grown out Proteus but she has not been treated with antibiotic. Since the TAVR procedure she has done well. Taking Lexapro 5 mg for anxiety depression and that seems to be better.  We had agreed previously to keep a check on her sodium given the risk of hyponatremia on certain antidepressants.  Review of Systems:  Review of Systems  Constitutional: Negative.   HENT: Negative.    Respiratory: Negative.    Cardiovascular: Negative.   Musculoskeletal: Negative.   Neurological: Negative.   Psychiatric/Behavioral: Negative.    All other systems reviewed and are negative.   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 Lynnette Caffey & Dr.  Laneta Simmers   Seasonal allergies    Takotsubo cardiomyopathy    Past Surgical History:  Procedure Laterality Date   COLONOSCOPY     EYE SURGERY Bilateral    cataracts removed   INTRAOPERATIVE TRANSTHORACIC ECHOCARDIOGRAM N/A 08/03/2021   Procedure: INTRAOPERATIVE TRANSTHORACIC ECHOCARDIOGRAM;  Surgeon: Orbie Pyo, MD;  Location: [facility] INVASIVE CV LAB;  Service: Open Heart Surgery;  Laterality: N/A;   PERICARDIOCENTESIS N/A 07/28/2021   Procedure: PERICARDIOCENTESIS;  Surgeon: Orbie Pyo, MD;  Location: [facility] INVASIVE CV LAB;  Service: Cardiovascular;  Laterality: N/A;   RIGHT/LEFT HEART CATH AND CORONARY ANGIOGRAPHY N/A 05/13/2019   Procedure: RIGHT/LEFT HEART CATH AND CORONARY ANGIOGRAPHY;  Surgeon: [place], Peter M, MD;  Location: [facility] INVASIVE CV LAB;  Service: Cardiovascular;  Laterality: N/A;   RIGHT/LEFT HEART CATH AND CORONARY ANGIOGRAPHY N/A 07/23/2021   Procedure: RIGHT/LEFT HEART CATH AND CORONARY ANGIOGRAPHY;  Surgeon: Corky Crafts, MD;  Location: [facility] INVASIVE CV LAB;  Service: Cardiovascular;  Laterality: N/A;   TONSILLECTOMY     TRANSCATHETER AORTIC VALVE REPLACEMENT, TRANSFEMORAL N/A 08/03/2021   Procedure: TRANSCATHETER AORTIC VALVE REPLACEMENT, TRANSFEMORAL;  Surgeon: Orbie Pyo, MD;  Location: [facility] INVASIVE CV LAB;  Service: Open Heart Surgery;  Laterality: N/A;   UPPER GI ENDOSCOPY     Social History:   reports that she has quit smoking. Her smoking use included cigarettes. She has a 20.00 pack-year  smoking history. She has never used smokeless tobacco. She reports current alcohol use. She reports that she does not use drugs.  Family History  Problem Relation Age of Onset   Cancer Sister    Lung cancer Sister    Alcohol abuse Sister    Melanoma Brother    Brain cancer Brother    Heart failure Brother    Diabetes Brother    Cancer Brother    Heart Problems Brother    Down syndrome Brother    Heart failure Brother    Endometriosis Daughter    Hypertension  Other     Medications: Patient's Medications  New Prescriptions   No medications on file  Previous Medications   ACETAMINOPHEN (TYLENOL) 500 MG TABLET    Take 500-1,000 mg by mouth every 6 (six) hours as needed for moderate pain.   ALBUTEROL (VENTOLIN HFA) 108 (90 BASE) MCG/ACT INHALER    Inhale 2 puffs into the lungs every 4 (four) hours as needed for wheezing or shortness of breath (seasonal allergies).    APIXABAN (ELIQUIS) 2.5 MG TABS TABLET    Take 1 tablet (2.5 mg total) by mouth 2 (two) times daily.   CHOLECALCIFEROL (VITAMIN D3 PO)    Take 1 tablet by mouth daily.   DILTIAZEM (CARDIZEM CD) 180 MG 24 HR CAPSULE    Take 180 mg by mouth as needed (Elevated heart rate).   DOCUSATE SODIUM (COLACE) 100 MG CAPSULE    Take 100 mg by mouth daily with breakfast.   ESCITALOPRAM (LEXAPRO) 5 MG TABLET    Take 1 tablet (5 mg total) by mouth daily.   FEXOFENADINE (ALLEGRA) 60 MG TABLET    Take 60 mg by mouth daily.   FUROSEMIDE (LASIX) 20 MG TABLET    Take 0.5 tablets (10 mg total) by mouth 2 (two) times daily.   LISINOPRIL (ZESTRIL) 5 MG TABLET    Take 1.25 mg by mouth daily.   MELATONIN 5 MG TABS    Take 5 mg by mouth at bedtime.   METOPROLOL TARTRATE (LOPRESSOR) 25 MG TABLET    TAKE ONE TABLET BY MOUTH TWICE A DAY   MONTELUKAST (SINGULAIR) 10 MG TABLET    Take 1 tablet (10 mg total) by mouth daily after supper.   MULTIPLE VITAMIN (MULTIVITAMIN WITH MINERALS) TABS TABLET    Take 1 tablet by mouth daily. Centrum 50 plus   MULTIPLE VITAMINS-MINERALS (EMERGEN-C VITAMIN C PO)    Take 1 packet by mouth.   OMEGA-3 FATTY ACIDS (FISH OIL PO)    Take 1 capsule by mouth daily.    OMEPRAZOLE (PRILOSEC) 20 MG CAPSULE    Take 20 mg by mouth daily before breakfast.   ROSUVASTATIN (CRESTOR) 5 MG TABLET    Take 1 tablet (5 mg total) by mouth daily.   TROLAMINE SALICYLATE (ASPERCREME EX)    Apply 1 application topically daily as needed (knee pain).  Modified Medications   No medications on file  Discontinued  Medications   No medications on file    Physical Exam:  There were no vitals filed for this visit. There is no height or weight on file to calculate BMI. Wt Readings from Last 3 Encounters:  02/07/22 122 lb (55.3 kg)  12/14/21 122 lb 1.6 oz (55.4 kg)  11/17/21 118 lb 9.6 oz (53.8 kg)    Physical Exam Vitals and nursing note reviewed.  Constitutional:      Appearance: Normal appearance.  Cardiovascular:     Rate and Rhythm: Normal  rate and regular rhythm.  Pulmonary:     Effort: Pulmonary effort is normal.     Breath sounds: Normal breath sounds.  Musculoskeletal:        General: Normal range of motion.  Neurological:     General: No focal deficit present.     Mental Status: She is alert.     Labs reviewed: Basic Metabolic Panel: Recent Labs    07/21/21 1948 07/22/21 0022 07/22/21 0421 07/23/21 0745 07/30/21 0128 08/03/21 1347 08/03/21 1542 08/04/21 0452 10/26/21 1625  NA 128*  --  131*   < > 125*   < > 131* 133* 139  K 4.2  --  3.9   < > 4.0   < > 4.3 4.4 4.3  CL 93*  --  93*   < > 91*   < > 99 101 103  CO2 27  --  28   < > 26  --   --  22 29  GLUCOSE 112*  --  109*   < > 99   < > 146* 134* 104  BUN 20  --  21   < > 20   < > 25* 29* 22  CREATININE 0.93  --  1.09*   < > 1.06*   < > 1.00 1.15* 1.01*  CALCIUM 8.9  --  9.1   < > 8.7*  --   --  8.9 9.8  MG 2.1  --  2.2  --   --   --   --  2.1  --   PHOS  --  3.0 3.4  --   --   --   --   --   --   TSH  --  1.763 1.472  --   --   --   --   --   --    < > = values in this interval not displayed.   Liver Function Tests: Recent Labs    07/22/21 0421 07/28/21 0716 07/30/21 0128  AST 70* 18 15  ALT 99* 31 22  ALKPHOS 175* 82 66  BILITOT 0.9 0.7 0.4  PROT 6.6 5.9* 5.7*  ALBUMIN 3.2* 2.7* 2.6*   No results for input(s): "LIPASE", "AMYLASE" in the last 8760 hours. No results for input(s): "AMMONIA" in the last 8760 hours. CBC: Recent Labs    07/21/21 1948 07/22/21 0421 07/23/21 0937 07/30/21 0128  08/03/21 1347 08/03/21 1542 08/04/21 0452 10/26/21 1625  WBC 8.4 7.5   < > 8.9  --   --  12.3* 6.6  NEUTROABS 5.8 4.7  --   --   --   --   --  2,944  HGB 11.5* 11.3*   < > 11.4*   < > 10.2* 11.0* 11.6*  HCT 35.6* 33.6*   < > 34.0*   < > 30.0* 34.5* 36.5  MCV 91.8 90.8   < > 88.5  --   --  91.8 91.3  PLT 267 275   < > 394  --   --  387 199   < > = values in this interval not displayed.   Lipid Panel: No results for input(s): "CHOL", "HDL", "LDLCALC", "TRIG", "CHOLHDL", "LDLDIRECT" in the last 8760 hours. TSH: Recent Labs    07/22/21 0022 07/22/21 0421  TSH 1.763 1.472   A1C: No results found for: "HGBA1C"   Assessment/Plan Primary hypertension Blood pressure okay at 148/82 on metoprolol and diltiazem  Dyslipidemia, goal LDL below 70 Patient continues on rosuvastatin 5  mg.  Lipids have not been assessed recently  PAF (paroxysmal atrial fibrillation) (HCC) Rhythm is regular today and would appear to be in sinus  Anxiety Seems to be doing better on Lexapro 5 mg     5. Urinary tract bacterial infections We will treat with Cipro 250 mg twice daily for 3 days  Alain Honey, MD [place] [phone]

## 2022-02-16 ENCOUNTER — Encounter: Payer: Self-pay | Admitting: Family Medicine

## 2022-02-16 LAB — COMPLETE METABOLIC PANEL WITH GFR
AG Ratio: 1.4 (calc) (ref 1.0–2.5)
ALT: 26 U/L (ref 6–29)
AST: 41 U/L — ABNORMAL HIGH (ref 10–35)
Albumin: 3.9 g/dL (ref 3.6–5.1)
Alkaline phosphatase (APISO): 49 U/L (ref 37–153)
BUN: 20 mg/dL (ref 7–25)
CO2: 31 mmol/L (ref 20–32)
Calcium: 9.5 mg/dL (ref 8.6–10.4)
Chloride: 99 mmol/L (ref 98–110)
Creat: 0.89 mg/dL (ref 0.60–0.95)
Globulin: 2.8 g/dL (calc) (ref 1.9–3.7)
Glucose, Bld: 87 mg/dL (ref 65–139)
Potassium: 4.4 mmol/L (ref 3.5–5.3)
Sodium: 136 mmol/L (ref 135–146)
Total Bilirubin: 1.1 mg/dL (ref 0.2–1.2)
Total Protein: 6.7 g/dL (ref 6.1–8.1)
eGFR: 64 mL/min/{1.73_m2} (ref >=60)

## 2022-02-16 LAB — CBC WITH DIFFERENTIAL/PLATELET
Absolute Monocytes: 462 cells/uL (ref 200–950)
Basophils Absolute: 49 cells/uL (ref 0–200)
Basophils Relative: 0.7 %
Eosinophils Absolute: 98 cells/uL (ref 15–500)
Eosinophils Relative: 1.4 %
HCT: 34.7 % — ABNORMAL LOW (ref 35.0–45.0)
Hemoglobin: 11.4 g/dL — ABNORMAL LOW (ref 11.7–15.5)
Lymphs Abs: 2597 cells/uL (ref 850–3900)
MCH: 29.5 pg (ref 27.0–33.0)
MCHC: 32.9 g/dL (ref 32.0–36.0)
MCV: 89.9 fL (ref 80.0–100.0)
MPV: 11.4 fL (ref 7.5–12.5)
Monocytes Relative: 6.6 %
Neutro Abs: 3794 cells/uL (ref 1500–7800)
Neutrophils Relative %: 54.2 %
Platelets: 229 10*3/uL (ref 140–400)
RBC: 3.86 10*6/uL (ref 3.80–5.10)
RDW: 13.2 % (ref 11.0–15.0)
Total Lymphocyte: 37.1 %
WBC: 7 10*3/uL (ref 3.8–10.8)

## 2022-02-16 LAB — LIPID PANEL
Cholesterol: 204 mg/dL — ABNORMAL HIGH (ref <200)
HDL: 85 mg/dL (ref >=50)
LDL Cholesterol (Calc): 95 mg/dL (calc)
Non-HDL Cholesterol (Calc): 119 mg/dL (calc) (ref <130)
Total CHOL/HDL Ratio: 2.4 (calc) (ref <5.0)
Triglycerides: 137 mg/dL (ref <150)

## 2022-02-16 LAB — TSH: TSH: 1.33 mIU/L (ref 0.40–4.50)

## 2022-02-16 NOTE — Telephone Encounter (Signed)
This encounter was created in error - please disregard.

## 2022-02-22 ENCOUNTER — Telehealth (INDEPENDENT_AMBULATORY_CARE_PROVIDER_SITE_OTHER): Payer: Medicare HMO | Admitting: Adult Health

## 2022-02-22 ENCOUNTER — Encounter: Payer: Self-pay | Admitting: Adult Health

## 2022-02-22 ENCOUNTER — Telehealth: Payer: Self-pay

## 2022-02-22 DIAGNOSIS — N3000 Acute cystitis without hematuria: Secondary | ICD-10-CM | POA: Diagnosis not present

## 2022-02-22 DIAGNOSIS — B349 Viral infection, unspecified: Secondary | ICD-10-CM

## 2022-02-22 NOTE — Progress Notes (Unsigned)
This service is provided via telemedicine  No vital signs collected/recorded due to the encounter was a telemedicine visit.   Location of patient (ex: home, work):  home  Patient consents to a telephone visit:  see Telehealth consent dated 02/22/2022  Location of the provider (ex: office, home):  [facility]  Name of any referring provider:  N/A  Names of all persons participating in the telemedicine service and their role in the encounter:  patient,Barbara Teschner Grayce Sessions, NP,Melissa Pickett [facility] Time spent on call:      DATE:  02/22/2022 MRN:  [id number]  BIRTHDAY: [birth year]   Contact Information     Name Relation Home Work Mobile   [place] Daughter   [phone]        Code Status History     Date Active Date Inactive Code Status Order ID Comments User Context   08/03/2021 1811 08/04/2021 2130 Full Code [id number]  Allena Katz Inpatient   07/28/2021 1122 07/30/2021 1815 Full Code [id number]  Maretta Bees, MD Inpatient   07/22/2021 0012 07/28/2021 1122 DNR [id number]  Therisa Doyne, MD ED   07/21/2021 2247 07/22/2021 0012 DNR [id number]  Therisa Doyne, MD ED   05/13/2019 1817 05/15/2019 2036 Full Code [id number]  [place], Peter M, MD Inpatient   05/13/2019 1817 05/13/2019 1817 Full Code [id number]  [place], Peter M, MD Inpatient        Chief Complaint  Patient presents with   Acute Visit    Patient is here for URI symptoms, and runny nose COVID test negative    HISTORY OF PRESENT ILLNESS: This is an 87 year old patient who had a telephone visit regarding URI symptoms. She stated that recently finished taking Cipro for her UTI. She now does not smell a strong urine odor. She complains now of having sore throat and clear nasal drainage which started yesterday. She took Tylenol and feels today that her nasal drainage and sore throat is getting better. She tested herself for COVID-19 and was negative. She denies  having cough, fever and chills.   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 Lynnette Caffey & Dr. Laneta Simmers   Seasonal allergies    Takotsubo cardiomyopathy      CURRENT MEDICATIONS: Reviewed  Patient's Medications  New Prescriptions   No medications on file  Previous Medications   ACETAMINOPHEN (TYLENOL) 500 MG TABLET    Take 500-1,000 mg by mouth every 6 (six) hours as needed for moderate pain.   ALBUTEROL (VENTOLIN HFA) 108 (90 BASE) MCG/ACT INHALER    Inhale 2 puffs into the lungs every 4 (four) hours as needed for wheezing or shortness of breath (seasonal allergies).    APIXABAN (ELIQUIS) 2.5 MG TABS TABLET    Take 1 tablet (2.5 mg total) by mouth 2 (two) times daily.   CHOLECALCIFEROL (VITAMIN D3 PO)    Take 1 tablet by  mouth daily.   DILTIAZEM (CARDIZEM CD) 180 MG 24 HR CAPSULE    Take 180 mg by mouth as needed (Elevated heart rate).   DOCUSATE SODIUM (COLACE) 100 MG CAPSULE    Take 100 mg by mouth daily with breakfast.   ESCITALOPRAM (LEXAPRO) 5 MG TABLET    Take 1 tablet (5 mg total) by mouth daily.   FEXOFENADINE (ALLEGRA) 60 MG TABLET    Take 60 mg by mouth daily.   FUROSEMIDE (LASIX) 20 MG TABLET    Take 0.5 tablets (10 mg total) by mouth 2 (two) times daily.   LISINOPRIL (ZESTRIL) 5 MG TABLET    Take 1.25 mg by mouth daily.   MELATONIN 5 MG TABS    Take 5 mg by mouth at bedtime.   METOPROLOL TARTRATE (LOPRESSOR) 25 MG TABLET    TAKE ONE TABLET BY MOUTH TWICE A DAY   MONTELUKAST (SINGULAIR) 10 MG  TABLET    Take 1 tablet (10 mg total) by mouth daily after supper.   MULTIPLE VITAMIN (MULTIVITAMIN WITH MINERALS) TABS TABLET    Take 1 tablet by mouth daily. Centrum 50 plus   MULTIPLE VITAMINS-MINERALS (EMERGEN-C VITAMIN C PO)    Take 1 packet by mouth.   OMEGA-3 FATTY ACIDS (FISH OIL PO)    Take 1 capsule by mouth daily.    OMEPRAZOLE (PRILOSEC) 20 MG CAPSULE    Take 20 mg by mouth daily before breakfast.   ROSUVASTATIN (CRESTOR) 5 MG TABLET    Take 1 tablet (5 mg total) by mouth daily.   TROLAMINE SALICYLATE (ASPERCREME EX)    Apply 1 application topically daily as needed (knee pain).  Modified Medications   No medications on file  Discontinued Medications   No medications on file     Allergies  Allergen Reactions   Codeine Nausea And Vomiting and Nausea Only   Cyclobenzaprine    Iodinated Contrast Media    Prednisone Other (See Comments)    Sores in mouth     REVIEW OF SYSTEMS:  GENERAL: no change in appetite, no fatigue, no weight changes, no fever, chills or weakness SKIN: Denies rash, itching, wounds, ulcer sores, or nail abnormality EYES: Denies change in vision, dry eyes, eye pain, itching or discharge EARS: Denies change in hearing, ringing in ears, or earache NOSE: has clear nasal drainage MOUTH and THROAT: Denies oral discomfort, gingival pain or bleeding, pain from teeth or hoarseness   RESPIRATORY: +sore throat CARDIAC: no chest pain, edema or palpitations GI: no abdominal pain, diarrhea, constipation, heart burn, nausea or vomiting GU: Denies dysuria, frequency, hematuria, incontinence, or discharge MUSCULOSKELETAL: Denies joit pain, muscle pain, back pain, restricted movement, or unusual weakness NEUROLOGICAL: Denies dizziness, syncope, numbness, or headache PSYCHIATRIC: Denies feeling of depression or anxiety. No report of hallucinations, insomnia, paranoia, or agitation   LABS/RADIOLOGY: Labs reviewed: Basic Metabolic Panel: Recent Labs     07/21/21 1948 07/22/21 0022 07/22/21 0421 07/23/21 0745 08/04/21 0452 10/26/21 1625 02/15/22 1119  NA 128*  --  131*   < > 133* 139 136  K 4.2  --  3.9   < > 4.4 4.3 4.4  CL 93*  --  93*   < > 101 103 99  CO2 27  --  28   < > 22 29 31   GLUCOSE 112*  --  109*   < > 134* 104 87  BUN 20  --  21   < > 29* 22 20  CREATININE 0.93  --  1.09*   < > 1.15* 1.01* 0.89  CALCIUM 8.9  --  9.1   < > 8.9 9.8 9.5  MG 2.1  --  2.2  --  2.1  --   --   PHOS  --  3.0 3.4  --   --   --   --    < > = values in this interval not displayed.   Liver Function Tests: Recent Labs    07/22/21 0421 07/28/21 0716 07/30/21 0128 02/15/22 1119  AST 70* 18 15 41*  ALT 99* 31 22 26   ALKPHOS 175* 82 66  --   BILITOT 0.9 0.7 0.4 1.1  PROT 6.6 5.9* 5.7* 6.7  ALBUMIN 3.2* 2.7* 2.6*  --    No results for input(s): "LIPASE", "AMYLASE" in the last 8760 hours. No results for input(s): "AMMONIA" in the last 8760 hours. CBC: Recent Labs    07/22/21 0421 07/23/21 0937 08/04/21 0452 10/26/21 1625 02/15/22 1119  WBC 7.5   < > 12.3* 6.6 7.0  NEUTROABS 4.7  --   --  2,944 3,794  HGB 11.3*   < > 11.0* 11.6* 11.4*  HCT 33.6*   < > 34.5* 36.5 34.7*  MCV 90.8   < > 91.8 91.3 89.9  PLT 275   < > 387 199 229   < > = values in this interval not displayed.   A1C: Invalid input(s): "A1C" Lipid Panel: Recent Labs    02/15/22 1119  HDL 85   Cardiac Enzymes: Recent Labs    07/22/21 0022  CKTOTAL 49   BNP: Invalid input(s): "POCBNP" CBG: No results for input(s): "GLUCAP" in the last 8760 hours.    No results found.  ASSESSMENT/PLAN:  1. Viral infection - tested negative for COVID-19 -  thought to be viral and not infection, recently completed antibiotics -  instructed to drink fluids and eat healthy meals -  instructed to gargle with warm water and salt daily  2. Acute cystitis without hematuria -  completed Cipro -   strong urine odor has resolved     Time spent on non face to face visit:   12 minutes  The patient gave consent to this telephone visit. Explained to the patient the risk and privacy issue that was involved with this telephone call.   The patient was advised to call back and ask for an in-person evaluation if the symptoms worsen or if the condition fails to improve.   07/24/21, NP Kenard Gower [phone]

## 2022-02-22 NOTE — Patient Instructions (Signed)

## 2022-02-22 NOTE — Telephone Encounter (Signed)
I connected with  Barbara Mcconnell on 02/22/22 by a video enabled telemedicine application and verified that I am speaking with the correct person using two identifiers.   I discussed the limitations of evaluation and management by telemedicine. The patient expressed understanding and agreed to proceed.

## 2022-03-15 ENCOUNTER — Encounter: Payer: Self-pay | Admitting: Family Medicine

## 2022-03-25 ENCOUNTER — Encounter (HOSPITAL_COMMUNITY)
Admission: EM | Disposition: A | Discharge status: Skilled Nursing Facility | Payer: Self-pay | Source: Home / Self Care | Attending: Neurology

## 2022-03-25 ENCOUNTER — Inpatient Hospital Stay (HOSPITAL_COMMUNITY)
Admission: EM | Admit: 2022-03-25 | Discharge: 2022-03-30 | DRG: 024 | Disposition: A | Discharge status: Skilled Nursing Facility | Payer: Medicare HMO | Attending: Neurology | Admitting: Neurology

## 2022-03-25 ENCOUNTER — Emergency Department (HOSPITAL_COMMUNITY): Payer: Medicare HMO | Admitting: Anesthesiology

## 2022-03-25 ENCOUNTER — Emergency Department (HOSPITAL_COMMUNITY): Payer: Medicare HMO

## 2022-03-25 ENCOUNTER — Telehealth: Payer: Self-pay

## 2022-03-25 ENCOUNTER — Inpatient Hospital Stay (HOSPITAL_COMMUNITY): Payer: Medicare HMO

## 2022-03-25 DIAGNOSIS — Z7901 Long term (current) use of anticoagulants: Secondary | ICD-10-CM | POA: Diagnosis not present

## 2022-03-25 DIAGNOSIS — I480 Paroxysmal atrial fibrillation: Secondary | ICD-10-CM | POA: Diagnosis not present

## 2022-03-25 DIAGNOSIS — Z801 Family history of malignant neoplasm of trachea, bronchus and lung: Secondary | ICD-10-CM

## 2022-03-25 DIAGNOSIS — Z888 Allergy status to other drugs, medicaments and biological substances status: Secondary | ICD-10-CM

## 2022-03-25 DIAGNOSIS — R270 Ataxia, unspecified: Secondary | ICD-10-CM | POA: Diagnosis present

## 2022-03-25 DIAGNOSIS — I482 Chronic atrial fibrillation, unspecified: Secondary | ICD-10-CM | POA: Diagnosis present

## 2022-03-25 DIAGNOSIS — R29704 NIHSS score 4: Secondary | ICD-10-CM | POA: Diagnosis present

## 2022-03-25 DIAGNOSIS — Z833 Family history of diabetes mellitus: Secondary | ICD-10-CM

## 2022-03-25 DIAGNOSIS — Z8673 Personal history of transient ischemic attack (TIA), and cerebral infarction without residual deficits: Secondary | ICD-10-CM | POA: Diagnosis not present

## 2022-03-25 DIAGNOSIS — R4701 Aphasia: Secondary | ICD-10-CM | POA: Diagnosis present

## 2022-03-25 DIAGNOSIS — Z952 Presence of prosthetic heart valve: Secondary | ICD-10-CM | POA: Diagnosis not present

## 2022-03-25 DIAGNOSIS — Z811 Family history of alcohol abuse and dependence: Secondary | ICD-10-CM | POA: Diagnosis not present

## 2022-03-25 DIAGNOSIS — K219 Gastro-esophageal reflux disease without esophagitis: Secondary | ICD-10-CM | POA: Diagnosis present

## 2022-03-25 DIAGNOSIS — I639 Cerebral infarction, unspecified: Principal | ICD-10-CM | POA: Diagnosis present

## 2022-03-25 DIAGNOSIS — I10 Essential (primary) hypertension: Secondary | ICD-10-CM

## 2022-03-25 DIAGNOSIS — M1990 Unspecified osteoarthritis, unspecified site: Secondary | ICD-10-CM | POA: Diagnosis present

## 2022-03-25 DIAGNOSIS — Z91041 Radiographic dye allergy status: Secondary | ICD-10-CM | POA: Diagnosis not present

## 2022-03-25 DIAGNOSIS — Z20822 Contact with and (suspected) exposure to covid-19: Secondary | ICD-10-CM | POA: Diagnosis present

## 2022-03-25 DIAGNOSIS — E441 Mild protein-calorie malnutrition: Secondary | ICD-10-CM | POA: Diagnosis present

## 2022-03-25 DIAGNOSIS — I6389 Other cerebral infarction: Secondary | ICD-10-CM | POA: Diagnosis not present

## 2022-03-25 DIAGNOSIS — Z6822 Body mass index (BMI) 22.0-22.9, adult: Secondary | ICD-10-CM

## 2022-03-25 DIAGNOSIS — Z953 Presence of xenogenic heart valve: Secondary | ICD-10-CM | POA: Diagnosis not present

## 2022-03-25 DIAGNOSIS — I2510 Atherosclerotic heart disease of native coronary artery without angina pectoris: Secondary | ICD-10-CM

## 2022-03-25 DIAGNOSIS — Z808 Family history of malignant neoplasm of other organs or systems: Secondary | ICD-10-CM | POA: Diagnosis not present

## 2022-03-25 DIAGNOSIS — Z8249 Family history of ischemic heart disease and other diseases of the circulatory system: Secondary | ICD-10-CM | POA: Diagnosis not present

## 2022-03-25 DIAGNOSIS — Z87891 Personal history of nicotine dependence: Secondary | ICD-10-CM

## 2022-03-25 DIAGNOSIS — I63512 Cerebral infarction due to unspecified occlusion or stenosis of left middle cerebral artery: Secondary | ICD-10-CM | POA: Diagnosis not present

## 2022-03-25 DIAGNOSIS — Z885 Allergy status to narcotic agent status: Secondary | ICD-10-CM

## 2022-03-25 DIAGNOSIS — F419 Anxiety disorder, unspecified: Secondary | ICD-10-CM

## 2022-03-25 DIAGNOSIS — E871 Hypo-osmolality and hyponatremia: Secondary | ICD-10-CM | POA: Diagnosis present

## 2022-03-25 DIAGNOSIS — E7800 Pure hypercholesterolemia, unspecified: Secondary | ICD-10-CM | POA: Diagnosis not present

## 2022-03-25 DIAGNOSIS — I63412 Cerebral infarction due to embolism of left middle cerebral artery: Secondary | ICD-10-CM | POA: Diagnosis not present

## 2022-03-25 DIAGNOSIS — I6521 Occlusion and stenosis of right carotid artery: Secondary | ICD-10-CM | POA: Diagnosis not present

## 2022-03-25 DIAGNOSIS — E785 Hyperlipidemia, unspecified: Secondary | ICD-10-CM | POA: Diagnosis present

## 2022-03-25 DIAGNOSIS — Z8279 Family history of other congenital malformations, deformations and chromosomal abnormalities: Secondary | ICD-10-CM

## 2022-03-25 HISTORY — PX: IR PERCUTANEOUS ART THROMBECTOMY/INFUSION INTRACRANIAL INC DIAG ANGIO: IMG6087

## 2022-03-25 HISTORY — PX: IR US GUIDE VASC ACCESS RIGHT: IMG2390

## 2022-03-25 HISTORY — PX: RADIOLOGY WITH ANESTHESIA: SHX6223

## 2022-03-25 HISTORY — PX: IR CT HEAD LTD: IMG2386

## 2022-03-25 LAB — RESP PANEL BY RT-PCR (FLU A&B, COVID) ARPGX2
Influenza A by PCR: NEGATIVE
Influenza B by PCR: NEGATIVE
SARS Coronavirus 2 by RT PCR: NEGATIVE

## 2022-03-25 LAB — DIFFERENTIAL
Abs Immature Granulocytes: 0.01 10*3/uL (ref 0.00–0.07)
Basophils Absolute: 0 10*3/uL (ref 0.0–0.1)
Basophils Relative: 1 %
Eosinophils Absolute: 0.1 10*3/uL (ref 0.0–0.5)
Eosinophils Relative: 2 %
Immature Granulocytes: 0 %
Lymphocytes Relative: 40 %
Lymphs Abs: 2.3 10*3/uL (ref 0.7–4.0)
Monocytes Absolute: 0.4 10*3/uL (ref 0.1–1.0)
Monocytes Relative: 7 %
Neutro Abs: 2.9 10*3/uL (ref 1.7–7.7)
Neutrophils Relative %: 50 %

## 2022-03-25 LAB — PROTIME-INR
INR: 1.1 (ref 0.8–1.2)
Prothrombin Time: 13.8 seconds (ref 11.4–15.2)

## 2022-03-25 LAB — CBC
HCT: 32.5 % — ABNORMAL LOW (ref 36.0–46.0)
Hemoglobin: 10.6 g/dL — ABNORMAL LOW (ref 12.0–15.0)
MCH: 29.4 pg (ref 26.0–34.0)
MCHC: 32.6 g/dL (ref 30.0–36.0)
MCV: 90 fL (ref 80.0–100.0)
Platelets: 245 10*3/uL (ref 150–400)
RBC: 3.61 MIL/uL — ABNORMAL LOW (ref 3.87–5.11)
RDW: 13.8 % (ref 11.5–15.5)
WBC: 5.7 10*3/uL (ref 4.0–10.5)
nRBC: 0 % (ref 0.0–0.2)

## 2022-03-25 LAB — I-STAT CHEM 8, ED
BUN: 23 mg/dL (ref 8–23)
Calcium, Ion: 1.18 mmol/L (ref 1.15–1.40)
Chloride: 99 mmol/L (ref 98–111)
Creatinine, Ser: 0.9 mg/dL (ref 0.44–1.00)
Glucose, Bld: 96 mg/dL (ref 70–99)
HCT: 34 % — ABNORMAL LOW (ref 36.0–46.0)
Hemoglobin: 11.6 g/dL — ABNORMAL LOW (ref 12.0–15.0)
Potassium: 3.9 mmol/L (ref 3.5–5.1)
Sodium: 134 mmol/L — ABNORMAL LOW (ref 135–145)
TCO2: 27 mmol/L (ref 22–32)

## 2022-03-25 LAB — CBG MONITORING, ED: Glucose-Capillary: 100 mg/dL — ABNORMAL HIGH (ref 70–99)

## 2022-03-25 LAB — COMPREHENSIVE METABOLIC PANEL
ALT: 35 U/L (ref 0–44)
AST: 46 U/L — ABNORMAL HIGH (ref 15–41)
Albumin: 3.6 g/dL (ref 3.5–5.0)
Alkaline Phosphatase: 54 U/L (ref 38–126)
Anion gap: 6 (ref 5–15)
BUN: 21 mg/dL (ref 8–23)
CO2: 26 mmol/L (ref 22–32)
Calcium: 9.2 mg/dL (ref 8.9–10.3)
Chloride: 101 mmol/L (ref 98–111)
Creatinine, Ser: 0.95 mg/dL (ref 0.44–1.00)
GFR, Estimated: 59 mL/min — ABNORMAL LOW (ref >60)
Glucose, Bld: 100 mg/dL — ABNORMAL HIGH (ref 70–99)
Potassium: 3.9 mmol/L (ref 3.5–5.1)
Sodium: 133 mmol/L — ABNORMAL LOW (ref 135–145)
Total Bilirubin: 1.2 mg/dL (ref 0.3–1.2)
Total Protein: 6.5 g/dL (ref 6.5–8.1)

## 2022-03-25 LAB — ETHANOL: Alcohol, Ethyl (B): <10 mg/dL (ref <10)

## 2022-03-25 LAB — MRSA NEXT GEN BY PCR, NASAL: MRSA by PCR Next Gen: NOT DETECTED

## 2022-03-25 LAB — APTT: aPTT: 23 seconds — ABNORMAL LOW (ref 24–36)

## 2022-03-25 SURGERY — IR WITH ANESTHESIA
Anesthesia: General

## 2022-03-25 MED ORDER — STROKE: EARLY STAGES OF RECOVERY BOOK
Freq: Once | Status: AC
  Filled 2022-03-25: qty 1

## 2022-03-25 MED ORDER — SENNOSIDES-DOCUSATE SODIUM 8.6-50 MG PO TABS
1.0000 | ORAL_TABLET | Freq: Every evening | ORAL | Status: DC | PRN
  Administered 2022-03-26 – 2022-03-27 (×2): 1 via ORAL
  Filled 2022-03-25 (×2): qty 1

## 2022-03-25 MED ORDER — ASPIRIN 325 MG PO TABS
325.0000 mg | ORAL_TABLET | Freq: Every day | ORAL | Status: DC
  Administered 2022-03-26: 325 mg via ORAL
  Filled 2022-03-25: qty 1

## 2022-03-25 MED ORDER — ONDANSETRON HCL 4 MG/2ML IJ SOLN
INTRAMUSCULAR | Status: DC | PRN
  Administered 2022-03-25: 4 mg via INTRAVENOUS

## 2022-03-25 MED ORDER — SODIUM CHLORIDE 0.9 % IV SOLN: INTRAVENOUS | Status: DC

## 2022-03-25 MED ORDER — EPHEDRINE SULFATE-NACL 50-0.9 MG/10ML-% IV SOSY
PREFILLED_SYRINGE | INTRAVENOUS | Status: DC | PRN
  Administered 2022-03-25 (×2): 5 mg via INTRAVENOUS

## 2022-03-25 MED ORDER — PHENYLEPHRINE HCL-NACL 20-0.9 MG/250ML-% IV SOLN
INTRAVENOUS | Status: DC | PRN
  Administered 2022-03-25: 50 ug/min via INTRAVENOUS

## 2022-03-25 MED ORDER — ASPIRIN 300 MG RE SUPP: 300.0000 mg | Freq: Every day | RECTAL | Status: DC

## 2022-03-25 MED ORDER — ROCURONIUM BROMIDE 10 MG/ML (PF) SYRINGE
PREFILLED_SYRINGE | INTRAVENOUS | Status: DC | PRN
  Administered 2022-03-25: 40 mg via INTRAVENOUS

## 2022-03-25 MED ORDER — CLEVIDIPINE BUTYRATE 0.5 MG/ML IV EMUL
INTRAVENOUS | Status: AC
  Filled 2022-03-25: qty 50

## 2022-03-25 MED ORDER — ACETAMINOPHEN 325 MG PO TABS: 650.0000 mg | ORAL_TABLET | ORAL | Status: DC | PRN

## 2022-03-25 MED ORDER — IOHEXOL 350 MG/ML SOLN
100.0000 mL | Freq: Once | INTRAVENOUS | Status: AC | PRN
  Administered 2022-03-25: 100 mL via INTRAVENOUS

## 2022-03-25 MED ORDER — ACETAMINOPHEN 650 MG RE SUPP: 650.0000 mg | RECTAL | Status: DC | PRN

## 2022-03-25 MED ORDER — FENTANYL CITRATE (PF) 250 MCG/5ML IJ SOLN
INTRAMUSCULAR | Status: DC | PRN
  Administered 2022-03-25: 150 ug via INTRAVENOUS

## 2022-03-25 MED ORDER — ACETAMINOPHEN 160 MG/5ML PO SOLN: 650.0000 mg | ORAL | Status: DC | PRN

## 2022-03-25 MED ORDER — LIDOCAINE HCL (CARDIAC) PF 100 MG/5ML IV SOSY
PREFILLED_SYRINGE | INTRAVENOUS | Status: DC | PRN
  Administered 2022-03-25: 60 mg via INTRATRACHEAL

## 2022-03-25 MED ORDER — SODIUM CHLORIDE 0.9 % IV SOLN: INTRAVENOUS | Status: DC | PRN

## 2022-03-25 MED ORDER — CLEVIDIPINE BUTYRATE 0.5 MG/ML IV EMUL
0.0000 mg/h | INTRAVENOUS | Status: DC
  Administered 2022-03-25 (×2): 2 mg/h via INTRAVENOUS
  Filled 2022-03-25 (×3): qty 50

## 2022-03-25 MED ORDER — IOHEXOL 300 MG/ML  SOLN
100.0000 mL | Freq: Once | INTRAMUSCULAR | Status: AC | PRN
Start: 2022-03-25 — End: 2022-03-25
  Administered 2022-03-25: 40 mL via INTRA_ARTERIAL

## 2022-03-25 MED ORDER — FENTANYL CITRATE (PF) 250 MCG/5ML IJ SOLN
INTRAMUSCULAR | Status: AC
  Filled 2022-03-25: qty 5

## 2022-03-25 MED ORDER — IOHEXOL 300 MG/ML  SOLN
100.0000 mL | Freq: Once | INTRAMUSCULAR | Status: AC | PRN
Start: 2022-03-25 — End: 2022-03-25
  Administered 2022-03-25: 35 mL via INTRA_ARTERIAL

## 2022-03-25 MED ORDER — SUGAMMADEX SODIUM 200 MG/2ML IV SOLN
INTRAVENOUS | Status: DC | PRN
  Administered 2022-03-25: 200 mg via INTRAVENOUS

## 2022-03-25 MED ORDER — VERAPAMIL HCL 2.5 MG/ML IV SOLN
INTRAVENOUS | Status: AC | PRN
  Administered 2022-03-25: 5 mg via INTRA_ARTERIAL

## 2022-03-25 MED ORDER — SUCCINYLCHOLINE CHLORIDE 200 MG/10ML IV SOSY
PREFILLED_SYRINGE | INTRAVENOUS | Status: DC | PRN
  Administered 2022-03-25: 110 mg via INTRAVENOUS

## 2022-03-25 MED ORDER — PANTOPRAZOLE SODIUM 40 MG PO TBEC
40.0000 mg | DELAYED_RELEASE_TABLET | Freq: Every day | ORAL | Status: DC
  Administered 2022-03-26 – 2022-03-30 (×5): 40 mg via ORAL
  Filled 2022-03-25 (×5): qty 1

## 2022-03-25 MED ORDER — CHLORHEXIDINE GLUCONATE CLOTH 2 % EX PADS
6.0000 | MEDICATED_PAD | Freq: Every day | CUTANEOUS | Status: DC
  Administered 2022-03-26: 6 via TOPICAL

## 2022-03-25 MED ORDER — VERAPAMIL HCL 2.5 MG/ML IV SOLN
INTRAVENOUS | Status: AC
  Filled 2022-03-25: qty 2

## 2022-03-25 MED ORDER — PROPOFOL 10 MG/ML IV BOLUS
INTRAVENOUS | Status: DC | PRN
  Administered 2022-03-25: 110 mg via INTRAVENOUS

## 2022-03-25 NOTE — Transfer of Care (Signed)
Immediate Anesthesia Transfer of Care Note  Patient: Hilton Cork  Procedure(s) Performed: IR WITH ANESTHESIA  Patient Location: PACU  Anesthesia Type:General  Level of Consciousness: awake  Airway & Oxygen Therapy: Patient Spontanous Breathing and Patient connected to nasal cannula oxygen  Post-op Assessment: Report given to RN, Post -op Vital signs reviewed and stable and Patient moving all extremities X 4  Post vital signs: Reviewed and stable  Last Vitals:  Vitals Value Taken Time  BP 139/87 03/25/22 1327  Temp    Pulse 70 03/25/22 1330  Resp 20 03/25/22 1330  SpO2 98 % 03/25/22 1330  Vitals shown include unvalidated device data.  Last Pain: There were no vitals filed for this visit.       Complications: No notable events documented.

## 2022-03-25 NOTE — H&P (Addendum)
Neurology Consultation  Reason for Consult: Code Stroke Referring Physician: Wallace Cullens  CC: word finding difficulties and code stroke  History is obtained from:EMS, chart review, daughter  HPI: Barbara Mcconnell is a 87 y.o. female with a past medical history anxiety, arthritis, CAD, GERD, heart murmur, hemorrhoids, HLD, HTN, afib on eliquis, TAVR, and takotsubo cardiomyopathy.  She spoke with her daughter on the phone last night at 2130 and was in her usual state of health. This morning when she spoke with her daughter she noted word finding difficulties and increased confusion. Barbara Mcconnell is independent and is able to complete all ADLs independently.  She had a TAVR done on 08/03/2021 and after discharge she did have a Zio patch that confirmed Afib. She has been on 2.5mg  of Eliquis BID since that time. Her last dose of eliquis was 03/24/2022 and she has not taken any of her medications today.   LKW: 2130 tpa given?: no, outside of window, on elquis Premorbid modified Rankin scale (mRS): 0 IR: yes, M3 occlusion discussed with Dr. Tommie Sams and family, decision was made to proceed with an emergent mechanical thrombectomy after discussion of risk benefits and alternatives.  NIHSS 4  premorbid MRS 1 ROS: Full ROS was performed and is negative except as noted in the HPI.    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 Lynnette Caffey & Dr. Laneta Simmers   Seasonal allergies    Takotsubo cardiomyopathy     Family History  Problem Relation Age of Onset    Cancer Sister    Lung cancer Sister    Alcohol abuse Sister    Melanoma Brother    Brain cancer Brother    Heart failure Brother    Diabetes Brother    Cancer Brother    Heart Problems Brother    Down syndrome Brother    Heart failure Brother    Endometriosis Daughter    Hypertension Other     Social History:   reports that she has quit smoking. Her smoking use included cigarettes. She has a 20.00 pack-year smoking history. She has never used smokeless tobacco. She reports current alcohol use. She reports that she does not use drugs.  Medications No current facility-administered medications for this encounter.  Current Outpatient Medications:    acetaminophen (TYLENOL) 500 MG tablet, Take 500-1,000 mg by mouth every 6 (six) hours as needed for moderate pain., Disp: , Rfl:    albuterol (VENTOLIN HFA) 108 (90 Base) MCG/ACT inhaler, Inhale 2 puffs into the lungs every 4 (four) hours as needed for wheezing or shortness of breath (seasonal allergies). , Disp: , Rfl:    apixaban (ELIQUIS) 2.5 MG TABS tablet, Take 1 tablet (2.5 mg total) by mouth 2 (two) times daily., Disp: 180 tablet, Rfl: 3   Cholecalciferol (VITAMIN D3 PO), Take 1 tablet by mouth daily., Disp: , Rfl:    diltiazem (CARDIZEM CD) 180 MG 24 hr  capsule, Take 180 mg by mouth as needed (Elevated heart rate)., Disp: , Rfl:    docusate sodium (COLACE) 100 MG capsule, Take 100 mg by mouth daily with breakfast., Disp: , Rfl:    escitalopram (LEXAPRO) 5 MG tablet, Take 1 tablet (5 mg total) by mouth daily., Disp: 90 tablet, Rfl: 2   fexofenadine (ALLEGRA) 60 MG tablet, Take 60 mg by mouth daily., Disp: , Rfl:    furosemide (LASIX) 20 MG tablet, Take 0.5 tablets (10 mg total) by mouth 2 (two) times daily., Disp: 120 tablet, Rfl: 3   lisinopril (ZESTRIL) 5 MG tablet, Take 1.25 mg by mouth daily., Disp: , Rfl:    melatonin 5 MG TABS, Take 5 mg by mouth at bedtime., Disp: , Rfl:    metoprolol tartrate (LOPRESSOR) 25 MG tablet, TAKE  ONE TABLET BY MOUTH TWICE A DAY, Disp: 180 tablet, Rfl: 2   montelukast (SINGULAIR) 10 MG tablet, Take 1 tablet (10 mg total) by mouth daily after supper., Disp: 90 tablet, Rfl: 3   Multiple Vitamin (MULTIVITAMIN WITH MINERALS) TABS tablet, Take 1 tablet by mouth daily. Centrum 50 plus, Disp: , Rfl:    Multiple Vitamins-Minerals (EMERGEN-C VITAMIN C PO), Take 1 packet by mouth., Disp: , Rfl:    Omega-3 Fatty Acids (FISH OIL PO), Take 1 capsule by mouth daily. , Disp: , Rfl:    omeprazole (PRILOSEC) 20 MG capsule, Take 20 mg by mouth daily before breakfast., Disp: , Rfl:    rosuvastatin (CRESTOR) 5 MG tablet, Take 1 tablet (5 mg total) by mouth daily., Disp: 90 tablet, Rfl: 1   Trolamine Salicylate (ASPERCREME EX), Apply 1 application topically daily as needed (knee pain)., Disp: , Rfl:   Exam: Current vital signs: There were no vitals taken for this visit. Vital signs in last 24 hours: BP: ()/()  Arterial Line BP: ()/()   GENERAL: Awake, alert frail petite elderly Caucasian lady in NAD HEENT: - Normocephalic and atraumatic, dry mm, no LN++, no Thyromegally LUNGS - Clear to auscultation bilaterally with no wheezes CV - S1S2 RRR, no m/r/g, equal pulses bilaterally. ABDOMEN - Soft, nontender, nondistended with normoactive BS Ext: warm, well perfused, intact peripheral pulses,no edema  NEURO:  Mental Status: AA&Ox2, States it is December 2023 Language: speech is clear but intermittently garbled. No dysarthria noted. Mild expressive aphasia and word finding difficulty with identifying object, repetition, and comprehension Cranial Nerves: PERRL 38mm/brisk. EOMI, visual fields full, no facial asymmetry, facial sensation intact, hearing intact, tongue/uvula/soft palate midline, normal sternocleidomastoid and trapezius muscle strength. No evidence of tongue atrophy or fibrillations Motor:  RUE 5/5 LUE 5/5 RLE 5/5 LLE 5/5 Tone: is normal and bulk is normal Sensation- Intact to light touch  bilaterally Coordination: FTN intact bilaterally, no ataxia in BLE. Gait- deferred  NIHSS components Score: Comment  1a Level of Conscious 0[x]  1[]  2[]  3[]         1b LOC Questions 0[]  1[]  2[x]           1c LOC Commands 0[x]  1[]  2[]           2 Best Gaze 0[x]  1[]  2[]           3 Visual 0[x]  1[]  2[]  3[]         4 Facial Palsy 0[x]  1[]  2[]  3[]         5a Motor Arm - left 0[x]  1[]  2[]  3[]  4[]  UN[]     5b Motor Arm - Right 0[x]  1[]  2[]  3[]  4[]  UN[]     6a Motor Leg -  Left 0[x]  1[]  2[]  3[]  4[]  UN[]     6b Motor Leg - Right 0[x]  1[]  2[]  3[]  4[]  UN[]     7 Limb Ataxia 0[x]  1[]  2[]  3[]  UN[]       8 Sensory 0[x]  1[]  2[]  UN[]         9 Best Language 0[]  1[]  2[x]  3[]         10 Dysarthria 0[x]  1[]  2[]  UN[]         11 Extinct. and Inattention 0[x]  1[]  2[]           TOTAL: 4       Labs I have reviewed labs in epic and the results pertinent to this consultation are:  CBC    Component Value Date/Time   WBC 7.0 02/15/2022 1119   RBC 3.86 02/15/2022 1119   HGB 11.4 (L) 02/15/2022 1119   HCT 34.7 (L) 02/15/2022 1119   PLT 229 02/15/2022 1119   MCV 89.9 02/15/2022 1119   MCH 29.5 02/15/2022 1119   MCHC 32.9 02/15/2022 1119   RDW 13.2 02/15/2022 1119   LYMPHSABS 2,597 02/15/2022 1119   MONOABS 0.6 07/22/2021 0421   EOSABS 98 02/15/2022 1119   BASOSABS 49 02/15/2022 1119    CMP     Component Value Date/Time   NA 136 02/15/2022 1119   NA 136 05/31/2019 1428   K 4.4 02/15/2022 1119   CL 99 02/15/2022 1119   CO2 31 02/15/2022 1119   GLUCOSE 87 02/15/2022 1119   BUN 20 02/15/2022 1119   BUN 20 05/31/2019 1428   CREATININE 0.89 02/15/2022 1119   CALCIUM 9.5 02/15/2022 1119   PROT 6.7 02/15/2022 1119   ALBUMIN 2.6 (L) 07/30/2021 0128   AST 41 (H) 02/15/2022 1119   ALT 26 02/15/2022 1119   ALKPHOS 66 07/30/2021 0128   BILITOT 1.1 02/15/2022 1119   GFRNONAA 47 (L) 08/04/2021 0452   GFRAA 65 05/31/2019 1428    Lipid Panel     Component Value Date/Time   CHOL 204 (H) 02/15/2022 1119   TRIG  137 02/15/2022 1119   HDL 85 02/15/2022 1119   CHOLHDL 2.4 02/15/2022 1119   LDLCALC 95 02/15/2022 1119     Imaging I have reviewed the images obtained: CT-head 1. No acute intracranial hemorrhage or acute demarcated cortical infarct. 2. Suspected calcified embolus within a left M2 MCA vessel, as described. Correlate with findings on pending CTA head/neck. 3. Tiny infarct within the left cerebellar hemisphere, new from the prior head CT of 12/27/2020 but otherwise age-indeterminate. 4. 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  CTA Head- Occluded proximal-to-mid M2 left MCA vessel. CTA Neck- The common carotid and internal carotid arteries are patent within the neck. Atherosclerotic plaque, bilaterally. Most notably, there is a severe (estimated 80-90% stenosis) at the origin of the right ICA. CT Perfusion- 12 mL region of critically hypoperfused parenchyma within the left MCA vascular territory. No core infarct is identified. Reported mismatch volume: 12 mL.  Assessment:  Left M2 occlusion likely related to diagnosed atrial fibrillation 87 y.o. female with a past medical history anxiety, arthritis, CAD, GERD, heart murmur, hemorrhoids, HLD, HTN, afib on eliquis, TAVR, and takotsubo cardiomyopathy presenting with confusion and expressive aphasia. She has previously been evaluated by vascular surgery for carotid stenosis with right ICA being worse than her left. Decision was made to proceed with emergent mechanical thrombectomy for the Left M2 occlusion. Daughter was present to consent.   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 from atrial fibrillation despite being on anticoagulation with Eliquis  CNS -Close neuro monitoring -NPO until cleared by speech -ST -Advance diet as tolerated  Altered mental status -Correct metabolic causes -Monitor  RESP Acute Respiratory Failure  -vent management per ICU -wean  when able  CV -Aggressive BP control, goal SBP < 140 for 24 hours post IR procedure   Heart failure, unspecified Known cardiomyopathy  Hyperlipidemia, unspecified  - Statin for goal LDL < 70  Chronic atrial fibrillation -Rate control  HEME -Monitor -Transfuse for hgb < 7 Coagulopathy secondary to anticoagulation with eliquis  ENDO -goal HgbA1c < 7%  GI/GU -Gentle hydration -Urinalysis sent  Fluid/Electrolyte Disorders Hyponatremia -Replete -Repeat labs -Trend  ID -CXR -Urinalysis -NPO -Monitor  Nutrition E46 Protein-Calorie Malnutrition Mild  -diet consult  Prophylaxis DVT:  SCDs GI: PPI Bowel: Docusate / Senna  Diet: NPO until cleared by speech  Code Status: Full Code      Recommendations: - HgbA1c, fasting lipid panel - MRI, MRA  of the brain without contrast - Frequent neuro checks - Echocardiogram - Resume eliquis when medically appropriate - Risk factor modification - Telemetry monitoring - PT consult, OT consult, Speech consult - Stroke team to follow   Patient seen and examined by NP/APP with MD. MD to update note as needed.   Elmer Picker, DNP, FNP-BC [facility] Pager: [phone]  STROKE MD NOTE : I have personally obtained history,examined this patient, reviewed notes, independently viewed imaging studies, participated in medical decision making and plan of care.ROS completed by me personally and pertinent positives fully documented  I have made any additions or clarifications directly to the above note. Agree with note above.  Patient presented with sudden onset of moderate expressive aphasia without significant hemiparesis secondary to left M2 branch occlusion likely from A-fib despite being on anticoagulation with Eliquis.  She is outside time window for thrombolysis and had taken her Eliquis dose last night but underwent successful mechanical thrombectomy after careful discussion of risk benefits and alternatives  with the patient's daughter at the bedside.  Admit to neurological intensive care unit for close neurological monitoring and strict control of blood pressure with systolic goal between 120-140 for the first 24 hours.  Check MRI scan of the brain later, echocardiogram, lipid profile, hemoglobin A1c later.  Resume anticoagulation if no hemorrhage noted on follow-up imaging.  Long discussion with patient and daughter at the bedside and answered questions.  Discussed with Dr.Demacedo Sherlon Handing mention neuroradiologist.This patient is critically ill and at significant risk of neurological worsening, death and care requires constant monitoring of vital signs, hemodynamics,respiratory and cardiac monitoring, extensive review of multiple databases, frequent neurological assessment, discussion with family, other specialists and medical decision making of high complexity.I have made any additions or clarifications directly to the above note.This critical care time does not reflect procedure time, or teaching time or supervisory time of PA/NP/Med Resident etc but could involve care discussion time.  I spent 60 minutes of neurocritical care time  in the care of  this patient.     Delia Heady, MD Medical Director [facility] Pager: [phone] 03/25/2022 2:11 PM

## 2022-03-25 NOTE — Progress Notes (Signed)
Responded to page to provide support to patient. Pt is doing ok and going to Ct . Chaplain available as needed.  Venida Jarvis, [place], [facility], Pager [phone]

## 2022-03-25 NOTE — Procedures (Addendum)
INTERVENTIONAL NEURORADIOLOGY BRIEF POSTPROCEDURE NOTE  DIAGNOSTIC CEREBRAL ANGIOGRAM AND MECHANICAL THROMBECTOMY   Attending: Dr. Baldemar Lenis  Diagnosis: Left M3/MCA occlusion   Access site: Right common femoral artery, 8 French   Access closure: 8 [place] Angio-Seal   Anesthesia: General anesthesia   Medication used: Refer to anesthesia documentation.  Complications: None.   Estimated blood loss: 30 mL.   Specimen: None.   Findings: Occlusion of the left M3/MCA middle posterior division branch mechanical thrombectomy performed with direct contact aspiration or with recanalization of the M3 segment.  Reocclusion noted on delayed angiogram.  A second pass with stent retriever was performed with recanalization of the M3 segment.  Persistent occlusion of a distal M3 segment in the posterior parietal region was noted with retrograde flow noted via collaterals (TICI 2B).  Given very distal occlusion, no angiographic opacification of the origin of the vessel and adequate collateral flow, decision was made to not pursue recanalization.   The patient tolerated the procedure well without incident or complication and is in stable condition.  PLAN: - SBP 120-140 mmHg - bed rest x6 hours

## 2022-03-25 NOTE — Telephone Encounter (Signed)
Patient's daughter called to notify Dr.Miller that she was in the hospital having emergency surgery due to a blood clot they found on her brain. The daughter states that patient was transported to [facility] today,03/25/22 via EMS. She didn't go into any details and stated that the doctors at the [facility] is aware that she see Dr.Miller for PCP.

## 2022-03-25 NOTE — Anesthesia Procedure Notes (Signed)
Procedure Name: Intubation Date/Time: 03/25/2022 11:58 AM  Performed by: Marena Chancy, CRNAPre-anesthesia Checklist: Patient identified, Emergency Drugs available, Suction available and Patient being monitored Patient Re-evaluated:Patient Re-evaluated prior to induction Oxygen Delivery Method: Circle System Utilized Preoxygenation: Pre-oxygenation with 100% oxygen Induction Type: IV induction, Cricoid Pressure applied and Rapid sequence Laryngoscope Size: Miller and 2 Grade View: Grade II Tube type: Oral Tube size: 7.0 mm Number of attempts: 1 Airway Equipment and Method: Stylet and Oral airway Placement Confirmation: ETT inserted through vocal cords under direct vision, positive ETCO2 and breath sounds checked- equal and bilateral Tube secured with: Tape Dental Injury: Teeth and Oropharynx as per pre-operative assessment

## 2022-03-25 NOTE — Sedation Documentation (Signed)
Spoke with Shawn in pt placement. Aware of need of 4N bed.

## 2022-03-25 NOTE — Anesthesia Preprocedure Evaluation (Signed)
Anesthesia Evaluation  Patient identified by MRN, date of birth, ID band  Reviewed: Allergy & Precautions, Patient's Chart, lab work & pertinent test results, Unable to perform ROS - Chart review onlyPreop documentation limited or incomplete due to emergent nature of procedure.  History of Anesthesia Complications Negative for: history of anesthetic complications  Airway Mallampati: III  TM Distance: <3 FB Neck ROM: Full    Dental  (+) Teeth Intact   Pulmonary former smoker,    breath sounds clear to auscultation       Cardiovascular hypertension, Pt. on medications + CAD  + Valvular Problems/Murmurs  Rhythm:Regular  1. 23 mm S3 is present in the aortic position. There is abnormal color  flow in the region of the prosthesis, but this is forward flow based on  color doppler. There is no apparenent regurgitation or paravalvular leak.  Possibly, this represents flow  between the stent frame and aortic root. There is also a small color  signal below the annulus in the 8-10 o'clock position. Cannot exclude a  very small iatrogenic VSD. Vmax 2.1 m/s, MG 9.5 mmHG, EOA 2.42 cm2.  Hemodynamics are within limits. Would  consider TEE for characterization if clinically indicated. The aortic  valve has been repaired/replaced. Aortic valve regurgitation is not  visualized. There is a 23 mm Sapien prosthetic (TAVR) valve present in the  aortic position. Procedure Date:  08/03/2021.  2. Left ventricular ejection fraction, by estimation, is >75%. The left  ventricle has hyperdynamic function. The left ventricle has no regional  wall motion abnormalities. There is moderate concentric left ventricular  hypertrophy. Left ventricular  diastolic function could not be evaluated.  3. Right ventricular systolic function is normal. The right ventricular  size is normal. There is normal pulmonary artery systolic pressure. The  estimated right ventricular  systolic pressure is 26.0 mmHg.  4. Left atrial size was mildly dilated.  5. The mitral valve is degenerative. No evidence of mitral valve  regurgitation. Moderate mitral annular calcification.  6. The inferior vena cava is normal in size with greater than 50%  respiratory variability, suggesting right atrial pressure of 3 mmHg.    Neuro/Psych PSYCHIATRIC DISORDERS Anxiety CVA, Residual Symptoms    GI/Hepatic GERD  ,  Endo/Other  negative endocrine ROS  Renal/GU Renal diseaseLab Results      Component                Value               Date                      CREATININE               0.90                03/25/2022                Musculoskeletal  (+) Arthritis ,   Abdominal   Peds  Hematology  (+) Blood dyscrasia, anemia , eliquis  Lab Results      Component                Value               Date                      WBC  5.7                 03/25/2022                HGB                      11.6 (L)            03/25/2022                HCT                      34.0 (L)            03/25/2022                MCV                      90.0                03/25/2022                PLT                      245                 03/25/2022              Anesthesia Other Findings   Reproductive/Obstetrics                             Anesthesia Physical Anesthesia Plan  ASA: 4  Anesthesia Plan: General   Post-op Pain Management: Minimal or no pain anticipated   Induction: Intravenous, Rapid sequence and Cricoid pressure planned  PONV Risk Score and Plan: 3 and Ondansetron and Dexamethasone  Airway Management Planned: Oral ETT  Additional Equipment: None  Intra-op Plan:   Post-operative Plan: Extubation in OR  Informed Consent: I have reviewed the patients History and Physical, chart, labs and discussed the procedure including the risks, benefits and alternatives for the proposed anesthesia with the patient or  authorized representative who has indicated his/her understanding and acceptance.     Dental advisory given  Plan Discussed with: CRNA  Anesthesia Plan Comments:         Anesthesia Quick Evaluation

## 2022-03-25 NOTE — ED Triage Notes (Signed)
Pt BIB [facility] from home as a code stroke. Per EMS, pt's LKN was last night around 930pm. Pt woke up this morning exhibiting aphasia. EMS called. Hx HTN and AFIB. Pt on eliquis. A&O X4. VSS.

## 2022-03-25 NOTE — Sedation Documentation (Signed)
Spoke with Dorene Grebe in PACU. Made aware to expect pt post procedure until 4N bed is available for admission.

## 2022-03-25 NOTE — Sedation Documentation (Signed)
Pt in and out cathed using sterile technique per order Dr. Quay Burow. 400cc clear yellow urine drained. Pt under general anesthesia.

## 2022-03-25 NOTE — Sedation Documentation (Signed)
Spoke with Victorino Dike, RN, 4N Charge. No beds available at this time. Multiple transfer orders. Will touch base post procedure.

## 2022-03-25 NOTE — Sedation Documentation (Signed)
Attempted to call Architect. Unavailable at this time.

## 2022-03-25 NOTE — ED Provider Notes (Signed)
[facility] EMERGENCY DEPARTMENT Provider Note   CSN: [id number] Arrival date & time: 03/25/22  1054     History {Add pertinent medical, surgical, social history, OB history to HPI:1} No chief complaint on file.   Melodee Marceau is a 87 y.o. female.  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 Lynnette Caffey & Dr. Laneta Simmers   Seasonal allergies    Takotsubo cardiomyopathy     Past Surgical History:  Procedure Laterality Date   COLONOSCOPY     EYE SURGERY Bilateral    cataracts removed   INTRAOPERATIVE TRANSTHORACIC ECHOCARDIOGRAM N/A 08/03/2021   Procedure: INTRAOPERATIVE TRANSTHORACIC ECHOCARDIOGRAM;  Surgeon: Orbie Pyo, MD;  Location: [facility] INVASIVE CV LAB;  Service: Open Heart Surgery;  Laterality: N/A;   PERICARDIOCENTESIS N/A 07/28/2021   Procedure: PERICARDIOCENTESIS;  Surgeon: Orbie Pyo, MD;  Location: [facility] INVASIVE CV LAB;  Service: Cardiovascular;  Laterality: N/A;   RIGHT/LEFT HEART CATH AND CORONARY ANGIOGRAPHY N/A 05/13/2019   Procedure: RIGHT/LEFT HEART CATH AND CORONARY ANGIOGRAPHY;  Surgeon: [place], Peter M, MD;  Location: [facility] INVASIVE CV LAB;  Service: Cardiovascular;  Laterality: N/A;   RIGHT/LEFT HEART CATH AND CORONARY ANGIOGRAPHY N/A 07/23/2021   Procedure: RIGHT/LEFT HEART CATH AND CORONARY ANGIOGRAPHY;  Surgeon: Corky Crafts, MD;  Location: [facility] INVASIVE CV  LAB;  Service: Cardiovascular;  Laterality: N/A;   TONSILLECTOMY     TRANSCATHETER AORTIC VALVE REPLACEMENT, TRANSFEMORAL N/A 08/03/2021   Procedure: TRANSCATHETER AORTIC VALVE REPLACEMENT, TRANSFEMORAL;  Surgeon: Orbie Pyo, MD;  Location: [facility] INVASIVE CV LAB;  Service: Open Heart Surgery;  Laterality: N/A;   UPPER GI ENDOSCOPY       The history is provided by the patient. No language interpreter was used.       Home Medications Prior to Admission medications   Medication Sig Start Date End Date Taking? Authorizing Provider  acetaminophen (TYLENOL) 500 MG tablet Take 500-1,000 mg by mouth every 6 (six) hours as needed for moderate pain.    [provider]  albuterol (VENTOLIN HFA) 108 (90 Base) MCG/ACT inhaler Inhale 2 puffs into the lungs every 4 (four) hours as needed for wheezing or shortness of breath (seasonal allergies).  02/04/19   [provider]  apixaban (ELIQUIS) 2.5 MG TABS tablet Take 1 tablet (2.5 mg total) by mouth 2 (two) times daily. 08/05/21   Corrin Parker, PA-C  Cholecalciferol (VITAMIN D3 PO) Take 1 tablet by mouth daily.    [provider]  diltiazem (CARDIZEM CD) 180 MG 24 hr capsule Take 180 mg by mouth as needed (Elevated heart rate).    [provider]  docusate sodium (COLACE) 100 MG capsule Take 100 mg by mouth daily with breakfast.    [provider]  escitalopram (LEXAPRO) 5 MG  tablet Take 1 tablet (5 mg total) by mouth daily. 09/15/21   Frederica Kuster, MD  fexofenadine (ALLEGRA) 60 MG tablet Take 60 mg by mouth daily.    [provider]  furosemide (LASIX) 20 MG tablet Take 0.5 tablets (10 mg total) by mouth 2 (two) times daily. 08/04/21   Georgie Chard D, NP  lisinopril (ZESTRIL) 5 MG tablet Take 1.25 mg by mouth daily.    [provider]  melatonin 5 MG TABS Take 5 mg by mouth at bedtime.    [provider]  metoprolol tartrate (LOPRESSOR) 25 MG tablet TAKE ONE TABLET BY MOUTH  TWICE A DAY 02/08/22   Georgie Chard D, NP  montelukast (SINGULAIR) 10 MG tablet Take 1 tablet (10 mg total) by mouth daily after supper. 11/16/21   Frederica Kuster, MD  Multiple Vitamin (MULTIVITAMIN WITH MINERALS) TABS tablet Take 1 tablet by mouth daily. Centrum 50 plus    [provider]  Multiple Vitamins-Minerals (EMERGEN-C VITAMIN C PO) Take 1 packet by mouth.    [provider]  Omega-3 Fatty Acids (FISH OIL PO) Take 1 capsule by mouth daily.     [provider]  omeprazole (PRILOSEC) 20 MG capsule Take 20 mg by mouth daily before breakfast. 03/19/19   [provider]  rosuvastatin (CRESTOR) 5 MG tablet Take 1 tablet (5 mg total) by mouth daily. 01/19/22 01/19/23  Frederica Kuster, MD  Trolamine Salicylate (ASPERCREME EX) Apply 1 application topically daily as needed (knee pain).    [provider]      Allergies    Codeine, Cyclobenzaprine, Iodinated contrast media, and Prednisone    Review of Systems   Review of Systems  Physical Exam Updated Vital Signs Wt 58.4 kg   BMI 25.14 kg/m  Physical Exam  ED Results / Procedures / Treatments   Labs (all labs ordered are listed, but only abnormal results are displayed) Labs Reviewed  APTT - Abnormal; Notable for the following components:      Result Value   aPTT 23 (*)    All other components within normal limits  CBC - Abnormal; Notable for the following components:   RBC 3.61 (*)    Hemoglobin 10.6 (*)    HCT 32.5 (*)    All other components within normal limits  I-STAT CHEM 8, ED - Abnormal; Notable for the following components:   Sodium 134 (*)    Hemoglobin 11.6 (*)    HCT 34.0 (*)    All other components within normal limits  CBG MONITORING, ED - Abnormal; Notable for the following components:   Glucose-Capillary 100 (*)    All other components within normal limits  RESP PANEL BY RT-PCR (FLU A&B, COVID) ARPGX2  PROTIME-INR  DIFFERENTIAL  ETHANOL  COMPREHENSIVE  METABOLIC PANEL  RAPID URINE DRUG SCREEN, HOSP PERFORMED  URINALYSIS, ROUTINE W REFLEX MICROSCOPIC    EKG None  Radiology CT HEAD CODE STROKE WO CONTRAST  Result Date: 03/25/2022 CLINICAL DATA:  Code stroke. Neuro deficit, acute, stroke suspected. EXAM: CT HEAD WITHOUT CONTRAST TECHNIQUE: Contiguous axial images were obtained from the base of the skull through the vertex without intravenous contrast. RADIATION DOSE REDUCTION: This exam was performed according to the departmental dose-optimization program which includes automated exposure control, adjustment of the mA and/or kV according to patient size and/or use of iterative reconstruction technique. COMPARISON:  Head CT 12/27/2020. FINDINGS: Brain: Mild generalized parenchymal atrophy. 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 Tiny infarct within the left cerebellar hemisphere, new from the prior head CT of 12/27/2020 but otherwise age-indeterminate (series 5, image 51) (series 6, image 42). There is no acute intracranial hemorrhage. No demarcated cortical infarct. No extra-axial fluid collection. No evidence of an intracranial mass. No midline shift. Vascular: Atherosclerotic calcifications. Calcific focus within the left sylvian fissure, new from prior exams and concerning for a calcified embolus within an M2 left MCA vessel (series 5, image 31). Skull: No fracture or aggressive osseous lesion. Sinuses/Orbits: No mass or acute finding within the imaged orbits. Mild mucosal thickening within the right maxillary sinus at the imaged levels. Small to moderate-volume secretions within the left maxillary sinus at the imaged levels. Small-volume secretions within the right sphenoid sinus. Minimal mucosal thickening within the anterior ethmoid air cells, bilaterally. ASPECTS [facility] Stroke Program Early CT Score) - Ganglionic level infarction (caudate, lentiform nuclei, internal capsule, insula, M1-M3  cortex): 7 - Supraganglionic infarction (M4-M6 cortex): 3 Total score (0-10 with 10 being normal): 10 Pertinent results were communicated to Dr. Pearlean Brownie At 11:23 amon 9/1/2023by text page via the [facility] messaging system. IMPRESSION: 1. No acute intracranial hemorrhage or acute demarcated cortical infarct. 2. Suspected calcified embolus within a left M2 MCA vessel, as described. Correlate with findings on pending CTA head/neck. 3. Tiny infarct within the left cerebellar hemisphere, new from the prior head CT of 12/27/2020 but otherwise age-indeterminate. 4. 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 5. Mild generalized parenchymal atrophy. 6. Paranasal sinus disease at the imaged levels, as described. Electronically Signed   By: Jackey Loge D.O.   On: 03/25/2022 11:27    Procedures Procedures  {Document cardiac monitor, telemetry assessment procedure when appropriate:1}  Medications Ordered in ED Medications - No data to display  ED Course/ Medical Decision Making/ A&P                           Medical Decision Making Amount and/or Complexity of Data Reviewed Labs: ordered. Radiology: ordered.   This patient presents to the ED with chief complaint(s) of *** with pertinent past medical history of *** which further complicates the presenting complaint. The complaint involves an extensive differential diagnosis and also carries with it a high risk of complications and morbidity.    The differential diagnosis includes but not limited to ***. Serious etiologies were considered.   The initial plan is to ***   Additional history obtained: Additional history obtained from {additional history:26846} Records reviewed {records:26847}  Independent labs interpretation:  The following labs were independently interpreted: ***  Independent visualization of imaging: - I independently visualized the following imaging with scope of interpretation limited to determining  acute life threatening conditions related to emergency care: ***, which revealed ***  Cardiac monitoring was reviewed and interpreted by myself which shows ***  Treatment and Reassessment: ***  Consultation: - Consulted or discussed management/test interpretation w/ external professional: ***  Consideration for admission or further workup: Admission was considered ***  Social Determinants of health: Social History   Tobacco Use   Smoking status: Former    Packs/day: 1.00    Years: 20.00    Total pack years: 20.00    Types: Cigarettes   Smokeless tobacco: Never   Tobacco comments:    Quit 30 yrs ago as of 07/2021 per daughter  Vaping Use   Vaping Use: Never used  Substance Use Topics   Alcohol use: Yes    Comment:  occasiona wine   Drug use: Never      {Document critical care time when appropriate:1} {Document review of labs and clinical decision tools ie heart score, Chads2Vasc2 etc:1}  {Document your independent review of radiology images, and any outside records:1} {Document your discussion with family members, caretakers, and with consultants:1} {Document social determinants of health affecting pt's care:1} {Document your decision making why or why not admission, treatments were needed:1} Final Clinical Impression(s) / ED Diagnoses Final diagnoses:  None    Rx / DC Orders ED Discharge Orders     None

## 2022-03-25 NOTE — Code Documentation (Addendum)
Stroke Response Nurse Documentation Code Documentation  Barbara Mcconnell is a 87 y.o. female arriving to [facility]  via [facility] on 03/25/2022 with past medical hx of Afib on Eliquis, TAVR, HTN, HLP, CAD. On Eliquis (apixaban) daily last dose on 03/24/2022. Code stroke was activated by EMS.   Patient from home where she was LKW at 2130 last night and now complaining of trouble speaking. Daughter reports patient is very independent and normally has no trouble caring for herself. This morning she was noted to be confused and not getting her words out after waking up.  Stroke team at the bedside on patient arrival. Labs drawn and patient cleared for CT by Dr. Jacqulyn Bath. Patient to CT with team. NIHSS 4, see documentation for details and code stroke times. Patient with disoriented and Global aphasia  on exam. The following imaging was completed:  CT Head, CTA, and CTP. Patient is not a candidate for IV Thrombolytic due to being outside window. Patient is a candidate for IR due to LVO noted on imaging per MD Pearlean Brownie.   Delays: Some delay noted due to IV access for CTA/CTP unable to obtain IV for period of time. Once imaging was completed, some delay noted in consenting daughter and patient.   Care Plan: Transport to IR, Admit ICU.   Bedside handoff with IR RN Loraine Leriche.    [name]  Stroke Response RN

## 2022-03-26 ENCOUNTER — Inpatient Hospital Stay (HOSPITAL_COMMUNITY): Payer: Medicare HMO

## 2022-03-26 DIAGNOSIS — Z7901 Long term (current) use of anticoagulants: Secondary | ICD-10-CM | POA: Diagnosis not present

## 2022-03-26 DIAGNOSIS — Z953 Presence of xenogenic heart valve: Secondary | ICD-10-CM

## 2022-03-26 DIAGNOSIS — I6389 Other cerebral infarction: Secondary | ICD-10-CM | POA: Diagnosis not present

## 2022-03-26 DIAGNOSIS — I480 Paroxysmal atrial fibrillation: Secondary | ICD-10-CM

## 2022-03-26 DIAGNOSIS — I63412 Cerebral infarction due to embolism of left middle cerebral artery: Secondary | ICD-10-CM | POA: Diagnosis not present

## 2022-03-26 LAB — CBC
HCT: 31.2 % — ABNORMAL LOW (ref 36.0–46.0)
Hemoglobin: 10.1 g/dL — ABNORMAL LOW (ref 12.0–15.0)
MCH: 29.4 pg (ref 26.0–34.0)
MCHC: 32.4 g/dL (ref 30.0–36.0)
MCV: 91 fL (ref 80.0–100.0)
Platelets: 243 10*3/uL (ref 150–400)
RBC: 3.43 MIL/uL — ABNORMAL LOW (ref 3.87–5.11)
RDW: 13.9 % (ref 11.5–15.5)
WBC: 8.3 10*3/uL (ref 4.0–10.5)
nRBC: 0 % (ref 0.0–0.2)

## 2022-03-26 LAB — HEMOGLOBIN A1C
Hgb A1c MFr Bld: 5.4 % (ref 4.8–5.6)
Mean Plasma Glucose: 108.28 mg/dL

## 2022-03-26 LAB — BASIC METABOLIC PANEL
Anion gap: 8 (ref 5–15)
BUN: 11 mg/dL (ref 8–23)
CO2: 26 mmol/L (ref 22–32)
Calcium: 8.9 mg/dL (ref 8.9–10.3)
Chloride: 99 mmol/L (ref 98–111)
Creatinine, Ser: 0.8 mg/dL (ref 0.44–1.00)
GFR, Estimated: >60 mL/min (ref >60)
Glucose, Bld: 110 mg/dL — ABNORMAL HIGH (ref 70–99)
Potassium: 3.5 mmol/L (ref 3.5–5.1)
Sodium: 133 mmol/L — ABNORMAL LOW (ref 135–145)

## 2022-03-26 LAB — ECHOCARDIOGRAM COMPLETE
AR max vel: 2.99 cm2
AV Area VTI: 2.58 cm2
AV Area mean vel: 2.47 cm2
AV Mean grad: 11 mmHg
AV Peak grad: 15.8 mmHg
Ao pk vel: 1.98 m/s
Area-P 1/2: 2.09 cm2
MV VTI: 2.05 cm2
S' Lateral: 2.2 cm
Weight: 2059.98 oz

## 2022-03-26 LAB — LIPID PANEL
Cholesterol: 204 mg/dL — ABNORMAL HIGH (ref 0–200)
HDL: 86 mg/dL (ref >40)
LDL Cholesterol: 103 mg/dL — ABNORMAL HIGH (ref 0–99)
Total CHOL/HDL Ratio: 2.4 RATIO
Triglycerides: 77 mg/dL (ref <150)
VLDL: 15 mg/dL (ref 0–40)

## 2022-03-26 MED ORDER — ROSUVASTATIN CALCIUM 20 MG PO TABS
20.0000 mg | ORAL_TABLET | Freq: Every day | ORAL | Status: DC
  Administered 2022-03-26 – 2022-03-30 (×5): 20 mg via ORAL
  Filled 2022-03-26 (×5): qty 1

## 2022-03-26 MED ORDER — METOPROLOL TARTRATE 25 MG PO TABS
25.0000 mg | ORAL_TABLET | Freq: Two times a day (BID) | ORAL | Status: DC
  Administered 2022-03-26 – 2022-03-30 (×9): 25 mg via ORAL
  Filled 2022-03-26 (×9): qty 1

## 2022-03-26 MED ORDER — DABIGATRAN ETEXILATE MESYLATE 150 MG PO CAPS
150.0000 mg | ORAL_CAPSULE | Freq: Two times a day (BID) | ORAL | Status: DC
  Administered 2022-03-27 – 2022-03-30 (×7): 150 mg via ORAL
  Filled 2022-03-26 (×8): qty 1

## 2022-03-26 NOTE — Progress Notes (Signed)
Inpatient Rehab Admissions Coordinator:  ? ?Per therapy recommendations,  patient was screened for CIR candidacy by Kaley Jutras, MS, CCC-SLP. At this time, Pt. Appears to be a a potential candidate for CIR. I will place   order for rehab consult per protocol for full assessment. Please contact me any with questions. ? ?Kenney Going, MS, CCC-SLP ?Rehab Admissions Coordinator  ?[phone] (celll) ?[phone] (office) ? ?

## 2022-03-26 NOTE — Progress Notes (Signed)
Supervising Physician: Pedro Earls  Patient Status:  [facility] - In-pt  Chief Complaint:  Stroke, 1 day s/p DIAGNOSTIC CEREBRAL ANGIOGRAM AND MECHANICAL THROMBECTOMY  Subjective:  Sitting up in chair.  Daughter visiting.  Says she's feeling "not bad".  Appears to understand what's going on but not always able to articulate.  Daughter says patient is frustrated about not being able to find the words she wants to say. She has noted some memory deficits as well.  For example: Recognizes her as her daughter, but calls her by the sister's name  Allergies: Codeine, Cyclobenzaprine, Iodinated contrast media, and Prednisone  Medications: Prior to Admission medications   Medication Sig Start Date End Date Taking? Authorizing Provider  acetaminophen (TYLENOL) 500 MG tablet Take 500-1,000 mg by mouth every 6 (six) hours as needed for moderate pain.    [provider]  albuterol (VENTOLIN HFA) 108 (90 Base) MCG/ACT inhaler Inhale 2 puffs into the lungs every 4 (four) hours as needed for wheezing or shortness of breath (seasonal allergies).  02/04/19   [provider]  apixaban (ELIQUIS) 2.5 MG TABS tablet Take 1 tablet (2.5 mg total) by mouth 2 (two) times daily. 08/05/21   Darreld Mclean, PA-C  Cholecalciferol (VITAMIN D3 PO) Take 1 tablet by mouth daily.    [provider]  diltiazem (CARDIZEM CD) 180 MG 24 hr capsule Take 180 mg by mouth as needed (Elevated heart rate).    [provider]  docusate sodium (COLACE) 100 MG capsule Take 100 mg by mouth daily with breakfast.    [provider]  escitalopram (LEXAPRO) 5 MG tablet Take 1 tablet (5 mg total) by mouth daily. 09/15/21   Wardell Honour, MD  fexofenadine (ALLEGRA) 60 MG tablet Take 60 mg by mouth daily.    [provider]  furosemide (LASIX) 20 MG tablet Take 0.5 tablets (10 mg total) by mouth 2 (two) times daily. 08/04/21   Kathyrn Drown D, NP  lisinopril (ZESTRIL) 5  MG tablet Take 1.25 mg by mouth daily.    [provider]  melatonin 5 MG TABS Take 5 mg by mouth at bedtime.    [provider]  metoprolol tartrate (LOPRESSOR) 25 MG tablet TAKE ONE TABLET BY MOUTH TWICE A DAY 02/08/22   Kathyrn Drown D, NP  montelukast (SINGULAIR) 10 MG tablet Take 1 tablet (10 mg total) by mouth daily after supper. 11/16/21   Wardell Honour, MD  Multiple Vitamin (MULTIVITAMIN WITH MINERALS) TABS tablet Take 1 tablet by mouth daily. Centrum 50 plus    [provider]  Multiple Vitamins-Minerals (EMERGEN-C VITAMIN C PO) Take 1 packet by mouth.    [provider]  Omega-3 Fatty Acids (FISH OIL PO) Take 1 capsule by mouth daily.     [provider]  omeprazole (PRILOSEC) 20 MG capsule Take 20 mg by mouth daily before breakfast. 03/19/19   [provider]  rosuvastatin (CRESTOR) 5 MG tablet Take 1 tablet (5 mg total) by mouth daily. 01/19/22 01/19/23  Wardell Honour, MD  Trolamine Salicylate (ASPERCREME EX) Apply 1 application topically daily as needed (knee pain).    [provider]     Vital Signs: BP 131/76   Pulse 89   Temp 99.2 F (37.3 C) (Axillary)   Resp (!) 22   Wt 128 lb 12 oz (58.4 kg)   SpO2 (!) 85%   BMI 25.14 kg/m   Physical Exam Vitals reviewed.  Constitutional:  General: She is not in acute distress.    Appearance: She is not ill-appearing.  HENT:     Head: Normocephalic and atraumatic.     Mouth/Throat:     Pharynx: Oropharynx is clear.  Eyes:     Extraocular Movements: Extraocular movements intact.     Conjunctiva/sclera: Conjunctivae normal.  Cardiovascular:     Rate and Rhythm: Normal rate.     Pulses: Normal pulses.  Pulmonary:     Effort: Pulmonary effort is normal.     Breath sounds: Normal breath sounds.  Abdominal:     General: Abdomen is flat.     Palpations: Abdomen is soft.  Skin:    General: Skin is warm and dry.     Comments: Right CFA access site is soft,  pulse is strong, and there is no evidence for bleeding.  Neurological:     Mental Status: She is alert. She is confused.     Cranial Nerves: Cranial nerves 2-12 are intact.     Motor: No weakness.     Coordination: Coordination abnormal.     Imaging: MR BRAIN WO CONTRAST  Result Date: 03/26/2022 CLINICAL DATA:  87 year old female code stroke presentation. Left MCA M2/M3 branch occlusion status post endovascular reperfusion. EXAM: MRI HEAD WITHOUT CONTRAST TECHNIQUE: Multiplanar, multiecho pulse sequences of the brain and surrounding structures were obtained without intravenous contrast. COMPARISON:  CT head, CTA, CTP yesterday. FINDINGS: Brain: Posterior left sylvian fissure and lateral perirolandic region restricted diffusion corresponding to T-max abnormality on CTP yesterday. This is in an area of about 3.5 cm (series 5, image 76 and series 7, image 44). Some additional cortical restricted diffusion in the left insula, and multiple tiny additional cortical infarcts scattered in both the anterior left frontal lobe (series 5, image 77) and left MCA/PCA watershed including the superior occipital pole (image 71). There is also a tiny contralateral right superior frontal gyrus pre motor cortical focus of restricted diffusion series 5, image 84. No other contralateral restricted diffusion. No posterior fossa restricted diffusion. T2 and FLAIR hyperintense cytotoxic edema in the affected areas. No acute intracranial hemorrhage identified. No midline shift, mass effect, or evidence of intracranial mass lesion. No ventriculomegaly. Normal for age cerebral volume. Negative pituitary and cervicomedullary junction. Minimal for age scattered nonspecific cerebral white matter T2 and FLAIR hyperintensity. There are several small chronic infarcts in the right cerebellum AICA or SCA territory (series 10, image 7). Vascular: Major intracranial vascular flow voids are preserved. Dominant distal left vertebral artery as  seen yesterday. Skull and upper cervical spine: Multilevel degenerative appearing cervical spine spondylolisthesis, disc degeneration. Hyperostosis of the calvarium. Visualized bone marrow signal is within normal limits. Sinuses/Orbits: Some leftward gaze. Stable mild paranasal sinus mucosal thickening and/or fluid. Other: Mastoids are clear. Grossly normal visible internal auditory structures. Negative visible scalp and face. IMPRESSION: 1. Left MCA territory ischemia, most confluent in the area of the T-max abnormality yesterday. Additional tiny scattered cortical infarcts also at the left MCA/PCA watershed, and a solitary lesion in the right superior frontal gyrus. 2. No associated hemorrhage or mass effect. 3. Small chronic right cerebellar infarcts. Electronically Signed   By: Genevie Ann M.D.   On: 03/26/2022 05:01   IR PERCUTANEOUS ART THROMBECTOMY/INFUSION INTRACRANIAL INC DIAG ANGIO  Result Date: 03/25/2022 INDICATION: 87 year old female past medical history significant for anxiety, arthritis, CAD, GERD, heart murmur, hemorrhoids, HLD, HTN, afib on eliquis, TAVR, and takotsubo cardiomyopathy; baseline modified Rankin scale 1. She presented with aphasia; NIHSS 4. Her last  known well was 9:30 p.m. on 03/24/2022. Head CT showed no large acute territorial infarct or hemorrhage. CT angiogram of the head and neck showed an occlusion of a left M3 branch with a 12 mL ischemic penumbra on CT perfusion. She was transferred to our service for emergency mechanical thrombectomy. EXAM: ULTRASOUND-GUIDED VASCULAR ACCESS DIAGNOSTIC CEREBRAL ANGIOGRAM MECHANICAL FLAT PANEL HEAD CT COMPARISON:  CT/CT angiogram of the head and neck March 25, 2022. MEDICATIONS: No antibiotics administered. ANESTHESIA/SEDATION: The procedure was performed under general anesthesia. CONTRAST:  75 mL of Omnipaque 300 milligram/mL FLUOROSCOPY: Radiation Exposure Index (as provided by the fluoroscopic device): 5053 mGy Kerma COMPLICATIONS: None  immediate. TECHNIQUE: Informed written consent was obtained from the patient's daughter after a thorough discussion of the procedural risks, benefits and alternatives. All questions were addressed. Maximal Sterile Barrier Technique was utilized including caps, mask, sterile gowns, sterile gloves, sterile drape, hand hygiene and skin antiseptic. A timeout was performed prior to the initiation of the procedure. The right groin was prepped and draped in the usual sterile fashion. Using a micropuncture kit and the modified Seldinger technique, access was gained to the right common femoral artery and an 8 French sheath was placed. Real-time ultrasound guidance was utilized for vascular access including the acquisition of a permanent ultrasound image documenting patency of the accessed vessel. Under fluoroscopy, a Zoom 88 guide catheter was navigated over a 6 [place] VTK catheter and a 0.035" Terumo Glidewire into the aortic arch. The catheter was placed into the left common carotid artery and then advanced into the left internal carotid artery. The diagnostic catheter was removed. Frontal and lateral angiograms of the head were obtained. FINDINGS: 1. Atherosclerotic changes of the right common femoral artery with normal caliber, adequate for vascular access. 2. There is an occlusion of a left M3/MCA posterior division branch with wedge defects noted in the posterior parietal area. PROCEDURE: Using biplane roadmap, a Zoom 35 aspiration catheter was navigated over an Aristotle 24 microguidewire into the cavernous segment of the left ICA. The aspiration catheter was then advanced to the level of occlusion in the left M3 segment and connected to an aspiration pump. Continuous aspiration was performed for 2 minutes. The guide catheter was connected to a VacLok syringe. The aspiration catheter was subsequently removed under constant aspiration. The guide catheter was aspirated for debris. Left internal carotid artery angiogram  with magnified frontal lateral views of the head showed recanalization of the left M3 branch with mild vasospasm. A small wedge defect is noted in the posterior parietal region. Follow-up angiogram showed worsening of vasospasm with filling defect in the proximal aspect of the M3 branch. Using biplane roadmap guidance, a phenom 21 microcatheter and a Aristotle 14 microguidewire into the cavernous segment of the left ICA. The microcatheter was then navigated over the wire into the left M3/MCA branch. Then, 5 mg of verapamil were infused within the M3 branch. Follow-up angiogram showed worsening of the vasospasm with associated filling defect. Using biplane roadmap guidance, a phenom 21 microcatheter and a Aristotle 14 microguidewire into the cavernous segment of the left ICA. The microcatheter was then navigated over the wire into the left M3 segment. Then, a 3 mm solitaire stent retriever was deployed spanning the M3 segment. The device was allowed to intercalated with the clot for 4 minutes. The thrombectomy device was then removed. Follow-up angiograms with magnified frontal and lateral views of the head showed complete recanalization of that M3 branch with persistent small wedge defect in the posterior parietal  region. Arterial opacification via collateral flow is noted in the venous phase at the region of the arterial phase wedge defect, consistent with retrograde arterial filling via collaterals. Multiple magnified angiograms were obtained with different projections. However origin of the branch was not identified. The mild early draining vein noted in the region of the wedge defect, suggesting ongoing ischemia. Flat panel CT of the head was obtained and post processed in a separate workstation with concurrent attending physician supervision. Selected images were sent to PACS. No evidence of hemorrhagic transformation. The catheter was subsequently withdrawn. Right common femoral artery angiogram was obtained  in right anterior oblique view. The puncture is at the level of the common femoral artery. The artery has normal caliber, adequate for closure device. The sheath was exchanged over the wire for an 8 [place] Angio-Seal which was utilized for access closure. Immediate hemostasis was achieved. IMPRESSION: Mechanical thrombectomy performed for treatment of a left M3/MCA posterior division branch occlusion achieving adequate recanalization (TICI 2b). Persistent occlusion of a posterior parietal cortical branch noted. No hemorrhagic complication. PLAN: Transferred to ICU for continued post stroke care. Electronically Signed   By: Pedro Earls M.D.   On: 03/25/2022 17:18   IR US Guide Vasc Access Right  Result Date: 03/25/2022 INDICATION: 87 year old female past medical history significant for anxiety, arthritis, CAD, GERD, heart murmur, hemorrhoids, HLD, HTN, afib on eliquis, TAVR, and takotsubo cardiomyopathy; baseline modified Rankin scale 1. She presented with aphasia; NIHSS 4. Her last known well was 9:30 p.m. on 03/24/2022. Head CT showed no large acute territorial infarct or hemorrhage. CT angiogram of the head and neck showed an occlusion of a left M3 branch with a 12 mL ischemic penumbra on CT perfusion. She was transferred to our service for emergency mechanical thrombectomy. EXAM: ULTRASOUND-GUIDED VASCULAR ACCESS DIAGNOSTIC CEREBRAL ANGIOGRAM MECHANICAL FLAT PANEL HEAD CT COMPARISON:  CT/CT angiogram of the head and neck March 25, 2022. MEDICATIONS: No antibiotics administered. ANESTHESIA/SEDATION: The procedure was performed under general anesthesia. CONTRAST:  75 mL of Omnipaque 300 milligram/mL FLUOROSCOPY: Radiation Exposure Index (as provided by the fluoroscopic device): 0488 mGy Kerma COMPLICATIONS: None immediate. TECHNIQUE: Informed written consent was obtained from the patient's daughter after a thorough discussion of the procedural risks, benefits and alternatives. All questions  were addressed. Maximal Sterile Barrier Technique was utilized including caps, mask, sterile gowns, sterile gloves, sterile drape, hand hygiene and skin antiseptic. A timeout was performed prior to the initiation of the procedure. The right groin was prepped and draped in the usual sterile fashion. Using a micropuncture kit and the modified Seldinger technique, access was gained to the right common femoral artery and an 8 French sheath was placed. Real-time ultrasound guidance was utilized for vascular access including the acquisition of a permanent ultrasound image documenting patency of the accessed vessel. Under fluoroscopy, a Zoom 88 guide catheter was navigated over a 6 [place] VTK catheter and a 0.035" Terumo Glidewire into the aortic arch. The catheter was placed into the left common carotid artery and then advanced into the left internal carotid artery. The diagnostic catheter was removed. Frontal and lateral angiograms of the head were obtained. FINDINGS: 1. Atherosclerotic changes of the right common femoral artery with normal caliber, adequate for vascular access. 2. There is an occlusion of a left M3/MCA posterior division branch with wedge defects noted in the posterior parietal area. PROCEDURE: Using biplane roadmap, a Zoom 35 aspiration catheter was navigated over an Aristotle 24 microguidewire into the cavernous segment of the  left ICA. The aspiration catheter was then advanced to the level of occlusion in the left M3 segment and connected to an aspiration pump. Continuous aspiration was performed for 2 minutes. The guide catheter was connected to a VacLok syringe. The aspiration catheter was subsequently removed under constant aspiration. The guide catheter was aspirated for debris. Left internal carotid artery angiogram with magnified frontal lateral views of the head showed recanalization of the left M3 branch with mild vasospasm. A small wedge defect is noted in the posterior parietal region.  Follow-up angiogram showed worsening of vasospasm with filling defect in the proximal aspect of the M3 branch. Using biplane roadmap guidance, a phenom 21 microcatheter and a Aristotle 14 microguidewire into the cavernous segment of the left ICA. The microcatheter was then navigated over the wire into the left M3/MCA branch. Then, 5 mg of verapamil were infused within the M3 branch. Follow-up angiogram showed worsening of the vasospasm with associated filling defect. Using biplane roadmap guidance, a phenom 21 microcatheter and a Aristotle 14 microguidewire into the cavernous segment of the left ICA. The microcatheter was then navigated over the wire into the left M3 segment. Then, a 3 mm solitaire stent retriever was deployed spanning the M3 segment. The device was allowed to intercalated with the clot for 4 minutes. The thrombectomy device was then removed. Follow-up angiograms with magnified frontal and lateral views of the head showed complete recanalization of that M3 branch with persistent small wedge defect in the posterior parietal region. Arterial opacification via collateral flow is noted in the venous phase at the region of the arterial phase wedge defect, consistent with retrograde arterial filling via collaterals. Multiple magnified angiograms were obtained with different projections. However origin of the branch was not identified. The mild early draining vein noted in the region of the wedge defect, suggesting ongoing ischemia. Flat panel CT of the head was obtained and post processed in a separate workstation with concurrent attending physician supervision. Selected images were sent to PACS. No evidence of hemorrhagic transformation. The catheter was subsequently withdrawn. Right common femoral artery angiogram was obtained in right anterior oblique view. The puncture is at the level of the common femoral artery. The artery has normal caliber, adequate for closure device. The sheath was exchanged  over the wire for an 8 [place] Angio-Seal which was utilized for access closure. Immediate hemostasis was achieved. IMPRESSION: Mechanical thrombectomy performed for treatment of a left M3/MCA posterior division branch occlusion achieving adequate recanalization (TICI 2b). Persistent occlusion of a posterior parietal cortical branch noted. No hemorrhagic complication. PLAN: Transferred to ICU for continued post stroke care. Electronically Signed   By: Pedro Earls M.D.   On: 03/25/2022 17:18   IR CT Head Ltd  Result Date: 03/25/2022 INDICATION: 87 year old female past medical history significant for anxiety, arthritis, CAD, GERD, heart murmur, hemorrhoids, HLD, HTN, afib on eliquis, TAVR, and takotsubo cardiomyopathy; baseline modified Rankin scale 1. She presented with aphasia; NIHSS 4. Her last known well was 9:30 p.m. on 03/24/2022. Head CT showed no large acute territorial infarct or hemorrhage. CT angiogram of the head and neck showed an occlusion of a left M3 branch with a 12 mL ischemic penumbra on CT perfusion. She was transferred to our service for emergency mechanical thrombectomy. EXAM: ULTRASOUND-GUIDED VASCULAR ACCESS DIAGNOSTIC CEREBRAL ANGIOGRAM MECHANICAL FLAT PANEL HEAD CT COMPARISON:  CT/CT angiogram of the head and neck March 25, 2022. MEDICATIONS: No antibiotics administered. ANESTHESIA/SEDATION: The procedure was performed under general anesthesia. CONTRAST:  75  mL of Omnipaque 300 milligram/mL FLUOROSCOPY: Radiation Exposure Index (as provided by the fluoroscopic device): 0347 mGy Kerma COMPLICATIONS: None immediate. TECHNIQUE: Informed written consent was obtained from the patient's daughter after a thorough discussion of the procedural risks, benefits and alternatives. All questions were addressed. Maximal Sterile Barrier Technique was utilized including caps, mask, sterile gowns, sterile gloves, sterile drape, hand hygiene and skin antiseptic. A timeout was performed  prior to the initiation of the procedure. The right groin was prepped and draped in the usual sterile fashion. Using a micropuncture kit and the modified Seldinger technique, access was gained to the right common femoral artery and an 8 French sheath was placed. Real-time ultrasound guidance was utilized for vascular access including the acquisition of a permanent ultrasound image documenting patency of the accessed vessel. Under fluoroscopy, a Zoom 88 guide catheter was navigated over a 6 [place] VTK catheter and a 0.035" Terumo Glidewire into the aortic arch. The catheter was placed into the left common carotid artery and then advanced into the left internal carotid artery. The diagnostic catheter was removed. Frontal and lateral angiograms of the head were obtained. FINDINGS: 1. Atherosclerotic changes of the right common femoral artery with normal caliber, adequate for vascular access. 2. There is an occlusion of a left M3/MCA posterior division branch with wedge defects noted in the posterior parietal area. PROCEDURE: Using biplane roadmap, a Zoom 35 aspiration catheter was navigated over an Aristotle 24 microguidewire into the cavernous segment of the left ICA. The aspiration catheter was then advanced to the level of occlusion in the left M3 segment and connected to an aspiration pump. Continuous aspiration was performed for 2 minutes. The guide catheter was connected to a VacLok syringe. The aspiration catheter was subsequently removed under constant aspiration. The guide catheter was aspirated for debris. Left internal carotid artery angiogram with magnified frontal lateral views of the head showed recanalization of the left M3 branch with mild vasospasm. A small wedge defect is noted in the posterior parietal region. Follow-up angiogram showed worsening of vasospasm with filling defect in the proximal aspect of the M3 branch. Using biplane roadmap guidance, a phenom 21 microcatheter and a Aristotle 14  microguidewire into the cavernous segment of the left ICA. The microcatheter was then navigated over the wire into the left M3/MCA branch. Then, 5 mg of verapamil were infused within the M3 branch. Follow-up angiogram showed worsening of the vasospasm with associated filling defect. Using biplane roadmap guidance, a phenom 21 microcatheter and a Aristotle 14 microguidewire into the cavernous segment of the left ICA. The microcatheter was then navigated over the wire into the left M3 segment. Then, a 3 mm solitaire stent retriever was deployed spanning the M3 segment. The device was allowed to intercalated with the clot for 4 minutes. The thrombectomy device was then removed. Follow-up angiograms with magnified frontal and lateral views of the head showed complete recanalization of that M3 branch with persistent small wedge defect in the posterior parietal region. Arterial opacification via collateral flow is noted in the venous phase at the region of the arterial phase wedge defect, consistent with retrograde arterial filling via collaterals. Multiple magnified angiograms were obtained with different projections. However origin of the branch was not identified. The mild early draining vein noted in the region of the wedge defect, suggesting ongoing ischemia. Flat panel CT of the head was obtained and post processed in a separate workstation with concurrent attending physician supervision. Selected images were sent to PACS. No evidence of hemorrhagic  transformation. The catheter was subsequently withdrawn. Right common femoral artery angiogram was obtained in right anterior oblique view. The puncture is at the level of the common femoral artery. The artery has normal caliber, adequate for closure device. The sheath was exchanged over the wire for an 8 [place] Angio-Seal which was utilized for access closure. Immediate hemostasis was achieved. IMPRESSION: Mechanical thrombectomy performed for treatment of a left M3/MCA  posterior division branch occlusion achieving adequate recanalization (TICI 2b). Persistent occlusion of a posterior parietal cortical branch noted. No hemorrhagic complication. PLAN: Transferred to ICU for continued post stroke care. Electronically Signed   By: Pedro Earls M.D.   On: 03/25/2022 17:18   CT ANGIO HEAD NECK W WO CM W PERF (CODE STROKE)  Result Date: 03/25/2022 CLINICAL DATA:  Provided history: Neuro deficit, acute, stroke suspected. EXAM: CT ANGIOGRAPHY HEAD AND NECK CT PERFUSION BRAIN TECHNIQUE: Multidetector CT imaging of the head and neck was performed using the standard protocol during bolus administration of intravenous contrast. Multiplanar CT image reconstructions and MIPs were obtained to evaluate the vascular anatomy. Carotid stenosis measurements (when applicable) are obtained utilizing NASCET criteria, using the distal internal carotid diameter as the denominator. Multiphase CT imaging of the brain was performed following IV bolus contrast injection. Subsequent parametric perfusion maps were calculated using RAPID software. RADIATION DOSE REDUCTION: This exam was performed according to the departmental dose-optimization program which includes automated exposure control, adjustment of the mA and/or kV according to patient size and/or use of iterative reconstruction technique. CONTRAST:  169m OMNIPAQUE IOHEXOL 350 MG/ML SOLN COMPARISON:  Noncontrast head CT performed earlier today 03/25/2022. FINDINGS: CTA NECK FINDINGS Aortic arch: Standard aortic branching. Atherosclerotic plaque within the visualized aortic arch and proximal major branch vessels of the neck. No hemodynamically significant innominate or proximal subclavian artery stenosis. Right carotid system: CCA and ICA patent within the neck. Prominent calcified plaque about the carotid bifurcation and within the proximal ICA. Severe stenosis at the origin of the ICA (estimated to be 80-90%). Left carotid system:  CCA and ICA patent within the neck without hemodynamically significant stenosis (50% or greater). Atherosclerotic plaque scattered within the CCA, about the carotid bifurcation and within the proximal ICA. Vertebral arteries: The non dominant right vertebral artery is developmentally diminutive, but patent throughout the neck. The dominant left vertebral artery is patent within the neck. Mild atherosclerotic narrowing at the origin of this vessel. Skeleton: Grade 1 anterolisthesis at C2-C3, C3-C4, C4-C5. Grade 1 retrolisthesis at C5-C6. Grade 1 anterolisthesis at C6-C7 and C7-T1. Cervical spondylosis. Multilevel spinal canal narrowing. Most notably at C5-C6, a posterior disc osteophyte complex contributes to apparent moderate spinal canal stenosis. Multilevel bony neural foraminal narrowing. Disc degeneration is advanced at C4-C5, C5-C6 and C6-C7. Other neck: No neck mass or cervical lymphadenopathy. Upper chest: Bronchiectasis and scattered tree-in-bud opacities within the bilateral upper lobes. The tree-in-bud opacities likely reflect sequelae of chronic infection. Biapical pleuroparenchymal scarring. Review of the MIP images confirms the above findings CTA HEAD FINDINGS Anterior circulation: The intracranial internal carotid arteries are patent. Nonstenotic calcified plaque within both vessels. The M1 middle cerebral arteries are patent. Abrupt occlusion of a proximal to mid M2 left MCA vessel (series 14, image 27) (series 9, image 91). The anterior cerebral arteries are patent. No intracranial aneurysm is identified. Posterior circulation: Diminutive intracranial right vertebral artery with superimposed high-grade atherosclerotic stenosis distally. The dominant intracranial left vertebral artery is patent without stenosis. The basilar artery is patent. The posterior cerebral arteries are patent.  Atherosclerotic irregularity of both vessels without high-grade proximal stenosis. The posterior cerebral arteries are  fetal in origin, bilaterally. Venous sinuses: Within the limitations of contrast timing, no convincing thrombus. Anatomic variants: As described. Review of the MIP images confirms the above findings CT Brain Perfusion Findings: CBF (<30%) Volume: 55m Perfusion (Tmax>6.0s) volume: 169m(left MCA vascular territory) Mismatch Volume: 1280mnfarction Location:None identified CTA head impression #1 called by telephone at the time of interpretation on 03/25/2022 at 12:15 pm to provider Dr. KirLeonel Ramsayho verbally acknowledged these results. IMPRESSION: CTA neck: 1. The common carotid and internal carotid arteries are patent within the neck. Atherosclerotic plaque, bilaterally. Most notably, there is a severe (estimated 80-90% stenosis) at the origin of the right ICA. 2. Vertebral arteries patent within the neck. Mild atherosclerotic narrowing at the origin of the dominant left vertebral artery. 3.  Aortic Atherosclerosis (ICD10-I70.0). 4. Bronchiectasis and scattered tree-in-bud pulmonary opacities within the bilateral upper lobes. The tree-in-bud opacities likely reflect sequelae of chronic infection. 5. Cervical spondylosis. CTA head: 1. Occluded proximal-to-mid M2 left MCA vessel. 2. Additional intracranial atherosclerotic disease, as described. Most notably, there is a severe stenosis within the distal V4 right vertebral artery (the non-dominant vertebral artery). CT perfusion head: The perfusion software identifies a 12 mL region of critically hypoperfused parenchyma within the left MCA vascular territory. No core infarct is identified. Reported mismatch volume: 12 mL. Electronically Signed   By: KylKellie SimmeringO.   On: 03/25/2022 12:19   CT HEAD CODE STROKE WO CONTRAST  Result Date: 03/25/2022 CLINICAL DATA:  Code stroke. Neuro deficit, acute, stroke suspected. EXAM: CT HEAD WITHOUT CONTRAST TECHNIQUE: Contiguous axial images were obtained from the base of the skull through the vertex without intravenous  contrast. RADIATION DOSE REDUCTION: This exam was performed according to the departmental dose-optimization program which includes automated exposure control, adjustment of the mA and/or kV according to patient size and/or use of iterative reconstruction technique. COMPARISON:  Head CT 12/27/2020. FINDINGS: Brain: Mild generalized parenchymal atrophy. Redemonstrated small chronic infarct within the right cerebellar hemisphere (series 2, image 10). Tiny infarct within the left cerebellar hemisphere, new from the prior head CT of 12/27/2020 but otherwise age-indeterminate (series 5, image 51) (series 6, image 42). There is no acute intracranial hemorrhage. No demarcated cortical infarct. No extra-axial fluid collection. No evidence of an intracranial mass. No midline shift. Vascular: Atherosclerotic calcifications. Calcific focus within the left sylvian fissure, new from prior exams and concerning for a calcified embolus within an M2 left MCA vessel (series 5, image 31). Skull: No fracture or aggressive osseous lesion. Sinuses/Orbits: No mass or acute finding within the imaged orbits. Mild mucosal thickening within the right maxillary sinus at the imaged levels. Small to moderate-volume secretions within the left maxillary sinus at the imaged levels. Small-volume secretions within the right sphenoid sinus. Minimal mucosal thickening within the anterior ethmoid air cells, bilaterally. ASPECTS ([facility] Program Early CT Score) - Ganglionic level infarction (caudate, lentiform nuclei, internal capsule, insula, M1-M3 cortex): 7 - Supraganglionic infarction (M4-M6 cortex): 3 Total score (0-10 with 10 being normal): 10 Pertinent results were communicated to Dr. SetLeonie Man 11:23 amon 9/1/2023by text page via the [facility]aging system. IMPRESSION: 1. No acute intracranial hemorrhage or acute demarcated cortical infarct. 2. Suspected calcified embolus within a left M2 MCA vessel, as described. Correlate with findings on  pending CTA head/neck. 3. Tiny infarct within the left cerebellar hemisphere, new from the prior head CT of 12/27/2020 but otherwise age-indeterminate. 4. Redemonstrated small  chronic infarct within the right cerebellar hemisphere. 5. Mild generalized parenchymal atrophy. 6. Paranasal sinus disease at the imaged levels, as described. Electronically Signed   By: Kellie Simmering D.O.   On: 03/25/2022 11:27    Labs:  CBC: Recent Labs    10/26/21 1625 02/15/22 1119 03/25/22 1059 03/25/22 1105 03/26/22 0137  WBC 6.6 7.0 5.7  --  8.3  HGB 11.6* 11.4* 10.6* 11.6* 10.1*  HCT 36.5 34.7* 32.5* 34.0* 31.2*  PLT 199 229 245  --  243    COAGS: Recent Labs    07/21/21 1948 07/30/21 0128 03/25/22 1059  INR 1.0 1.1 1.1  APTT  --   --  23*    BMP: Recent Labs    07/30/21 0128 08/03/21 1347 08/04/21 0452 08/04/21 0452 10/26/21 1625 02/15/22 1119 03/25/22 1059 03/25/22 1105 03/26/22 0137  NA 125*   < > 133*  --  139 136 133* 134* 133*  K 4.0   < > 4.4  --  4.3 4.4 3.9 3.9 3.5  CL 91*   < > 101  --  103 99 101 99 99  CO2 26  --  22  --  29 31 26   --  26  GLUCOSE 99   < > 134*  --  104 87 100* 96 110*  BUN 20   < > 29*  --  22 20 21 23 11   CALCIUM 8.7*  --  8.9  --  9.8 9.5 9.2  --  8.9  CREATININE 1.06*   < > 1.15*   < > 1.01* 0.89 0.95 0.90 0.80  GFRNONAA 52*  --  47*  --   --   --  59*  --  >60   < > = values in this interval not displayed.    LIVER FUNCTION TESTS: Recent Labs    07/22/21 0421 07/28/21 0716 07/30/21 0128 02/15/22 1119 03/25/22 1059  BILITOT 0.9 0.7 0.4 1.1 1.2  AST 70* 18 15 41* 46*  ALT 99* 31 22 26  35  ALKPHOS 175* 82 66  --  54  PROT 6.6 5.9* 5.7* 6.7 6.5  ALBUMIN 3.2* 2.7* 2.6*  --  3.6    Assessment and Plan:  Stroke S/p mechanical thrombectomy with incomplete recanalization Extubated. Up and out of bed.  Has speech and coordination deficits CFA procedural access site without hematoma or bleeding.   Further management by Neurology.  IR  available as needed.  Electronically Signed: Pasty Spillers, PA 03/26/2022, 11:53 AM   I spent a total of 25 Minutes at the the patient's bedside AND on the patient's [facility] floor or unit, greater than 50% of which was counseling/coordinating care for post procedural care

## 2022-03-26 NOTE — Progress Notes (Signed)
STROKE TEAM PROGRESS NOTE   SUBJECTIVE (INTERVAL HISTORY) Her daughter is at the bedside.  Overall her condition is gradually improving. Pt sitting in chair after working with PT/OT with recommendation for CIR. BP 150s, but will relax BP goal. She still has partial aphasia and RUE ataxia, but muscle strength seemed equal. MRI showed left MCA parietal infarct, no significant HT. Pt and daughter stated compliance with eliquis 2.25m bid at home.    OBJECTIVE Temp:  [97.5 F (36.4 C)-99.3 F (37.4 C)] 99.2 F (37.3 C) (09/02 0800) Pulse Rate:  [61-95] 89 (09/02 0815) Cardiac Rhythm: Normal sinus rhythm (09/01 2000) Resp:  [12-35] 22 (09/02 0900) BP: (111-194)/(47-122) 131/76 (09/02 0900) SpO2:  [75 %-100 %] 85 % (09/02 0815) Weight:  [58.4 kg] 58.4 kg (09/01 1100)  Recent Labs  Lab 03/25/22 1057  GLUCAP 100*   Recent Labs  Lab 03/25/22 1059 03/25/22 1105 03/26/22 0137  NA 133* 134* 133*  K 3.9 3.9 3.5  CL 101 99 99  CO2 26  --  26  GLUCOSE 100* 96 110*  BUN _0 CREATININE 0.95 0.90 0.80  CALCIUM 9.2  --  8.9   Recent Labs  Lab 03/25/22 1059  AST 46*  ALT 35  ALKPHOS 54  BILITOT 1.2  PROT 6.5  ALBUMIN 3.6   Recent Labs  Lab 03/25/22 1059 03/25/22 1105 03/26/22 0137  WBC 5.7  --  8.3  NEUTROABS 2.9  --   --   HGB 10.6* 11.6* 10.1*  HCT 32.5* 34.0* 31.2*  MCV 90.0  --  91.0  PLT 245  --  243   No results for input(s): "CKTOTAL", "CKMB", "CKMBINDEX", "TROPONINI" in the last 168 hours. Recent Labs    03/25/22 1059  LABPROT 13.8  INR 1.1   No results for input(s): "COLORURINE", "LABSPEC", "PHURINE", "GLUCOSEU", "HGBUR", "BILIRUBINUR", "KETONESUR", "PROTEINUR", "UROBILINOGEN", "NITRITE", "LEUKOCYTESUR" in the last 72 hours.  Invalid input(s): "APPERANCEUR"     Component Value Date/Time   CHOL 204 (H) 03/26/2022 0137   TRIG 77 03/26/2022 0137   HDL 86 03/26/2022 0137   CHOLHDL 2.4 03/26/2022 0137   VLDL 15 03/26/2022 0137   LDLCALC 103 (H)  03/26/2022 0137   LDLCALC 95 02/15/2022 1119   Lab Results  Component Value Date   HGBA1C 5.4 03/26/2022      Component Value Date/Time   LABOPIA NONE DETECTED 05/13/2019 1056   COCAINSCRNUR NONE DETECTED 05/13/2019 1056   LABBENZ NONE DETECTED 05/13/2019 1056   AMPHETMU NONE DETECTED 05/13/2019 1056   THCU NONE DETECTED 05/13/2019 1056   LABBARB NONE DETECTED 05/13/2019 1056    Recent Labs  Lab 03/25/22 1104  ETH <10    I have personally reviewed the radiological images below and agree with the radiology interpretations.  MR BRAIN WO CONTRAST  Result Date: 03/26/2022 CLINICAL DATA:  87year old female code stroke presentation. Left MCA M2/M3 branch occlusion status post endovascular reperfusion. EXAM: MRI HEAD WITHOUT CONTRAST TECHNIQUE: Multiplanar, multiecho pulse sequences of the brain and surrounding structures were obtained without intravenous contrast. COMPARISON:  CT head, CTA, CTP yesterday. FINDINGS: Brain: Posterior left sylvian fissure and lateral perirolandic region restricted diffusion corresponding to T-max abnormality on CTP yesterday. This is in an area of about 3.5 cm (series 5, image 76 and series 7, image 44). Some additional cortical restricted diffusion in the left insula, and multiple tiny additional cortical infarcts scattered in both the anterior left frontal lobe (series 5, image 77) and left MCA/PCA watershed including  the superior occipital pole (image 71). There is also a tiny contralateral right superior frontal gyrus pre motor cortical focus of restricted diffusion series 5, image 84. No other contralateral restricted diffusion. No posterior fossa restricted diffusion. T2 and FLAIR hyperintense cytotoxic edema in the affected areas. No acute intracranial hemorrhage identified. No midline shift, mass effect, or evidence of intracranial mass lesion. No ventriculomegaly. Normal for age cerebral volume. Negative pituitary and cervicomedullary junction. Minimal for  age scattered nonspecific cerebral white matter T2 and FLAIR hyperintensity. There are several small chronic infarcts in the right cerebellum AICA or SCA territory (series 10, image 7). Vascular: Major intracranial vascular flow voids are preserved. Dominant distal left vertebral artery as seen yesterday. Skull and upper cervical spine: Multilevel degenerative appearing cervical spine spondylolisthesis, disc degeneration. Hyperostosis of the calvarium. Visualized bone marrow signal is within normal limits. Sinuses/Orbits: Some leftward gaze. Stable mild paranasal sinus mucosal thickening and/or fluid. Other: Mastoids are clear. Grossly normal visible internal auditory structures. Negative visible scalp and face. IMPRESSION: 1. Left MCA territory ischemia, most confluent in the area of the T-max abnormality yesterday. Additional tiny scattered cortical infarcts also at the left MCA/PCA watershed, and a solitary lesion in the right superior frontal gyrus. 2. No associated hemorrhage or mass effect. 3. Small chronic right cerebellar infarcts. Electronically Signed   By: Genevie Ann M.D.   On: 03/26/2022 05:01   IR PERCUTANEOUS ART THROMBECTOMY/INFUSION INTRACRANIAL INC DIAG ANGIO  Result Date: 03/25/2022 INDICATION: 87 year old female past medical history significant for anxiety, arthritis, CAD, GERD, heart murmur, hemorrhoids, HLD, HTN, afib on eliquis, TAVR, and takotsubo cardiomyopathy; baseline modified Rankin scale 1. She presented with aphasia; NIHSS 4. Her last known well was 9:30 p.m. on 03/24/2022. Head CT showed no large acute territorial infarct or hemorrhage. CT angiogram of the head and neck showed an occlusion of a left M3 branch with a 12 mL ischemic penumbra on CT perfusion. She was transferred to our service for emergency mechanical thrombectomy. EXAM: ULTRASOUND-GUIDED VASCULAR ACCESS DIAGNOSTIC CEREBRAL ANGIOGRAM MECHANICAL FLAT PANEL HEAD CT COMPARISON:  CT/CT angiogram of the head and neck March 25, 2022. MEDICATIONS: No antibiotics administered. ANESTHESIA/SEDATION: The procedure was performed under general anesthesia. CONTRAST:  75 mL of Omnipaque 300 milligram/mL FLUOROSCOPY: Radiation Exposure Index (as provided by the fluoroscopic device): 6808 mGy Kerma COMPLICATIONS: None immediate. TECHNIQUE: Informed written consent was obtained from the patient's daughter after a thorough discussion of the procedural risks, benefits and alternatives. All questions were addressed. Maximal Sterile Barrier Technique was utilized including caps, mask, sterile gowns, sterile gloves, sterile drape, hand hygiene and skin antiseptic. A timeout was performed prior to the initiation of the procedure. The right groin was prepped and draped in the usual sterile fashion. Using a micropuncture kit and the modified Seldinger technique, access was gained to the right common femoral artery and an 8 French sheath was placed. Real-time ultrasound guidance was utilized for vascular access including the acquisition of a permanent ultrasound image documenting patency of the accessed vessel. Under fluoroscopy, a Zoom 88 guide catheter was navigated over a 6 [place] VTK catheter and a 0.035" Terumo Glidewire into the aortic arch. The catheter was placed into the left common carotid artery and then advanced into the left internal carotid artery. The diagnostic catheter was removed. Frontal and lateral angiograms of the head were obtained. FINDINGS: 1. Atherosclerotic changes of the right common femoral artery with normal caliber, adequate for vascular access. 2. There is an occlusion of a left M3/MCA posterior  division branch with wedge defects noted in the posterior parietal area. PROCEDURE: Using biplane roadmap, a Zoom 35 aspiration catheter was navigated over an Aristotle 24 microguidewire into the cavernous segment of the left ICA. The aspiration catheter was then advanced to the level of occlusion in the left M3 segment and connected  to an aspiration pump. Continuous aspiration was performed for 2 minutes. The guide catheter was connected to a VacLok syringe. The aspiration catheter was subsequently removed under constant aspiration. The guide catheter was aspirated for debris. Left internal carotid artery angiogram with magnified frontal lateral views of the head showed recanalization of the left M3 branch with mild vasospasm. A small wedge defect is noted in the posterior parietal region. Follow-up angiogram showed worsening of vasospasm with filling defect in the proximal aspect of the M3 branch. Using biplane roadmap guidance, a phenom 21 microcatheter and a Aristotle 14 microguidewire into the cavernous segment of the left ICA. The microcatheter was then navigated over the wire into the left M3/MCA branch. Then, 5 mg of verapamil were infused within the M3 branch. Follow-up angiogram showed worsening of the vasospasm with associated filling defect. Using biplane roadmap guidance, a phenom 21 microcatheter and a Aristotle 14 microguidewire into the cavernous segment of the left ICA. The microcatheter was then navigated over the wire into the left M3 segment. Then, a 3 mm solitaire stent retriever was deployed spanning the M3 segment. The device was allowed to intercalated with the clot for 4 minutes. The thrombectomy device was then removed. Follow-up angiograms with magnified frontal and lateral views of the head showed complete recanalization of that M3 branch with persistent small wedge defect in the posterior parietal region. Arterial opacification via collateral flow is noted in the venous phase at the region of the arterial phase wedge defect, consistent with retrograde arterial filling via collaterals. Multiple magnified angiograms were obtained with different projections. However origin of the branch was not identified. The mild early draining vein noted in the region of the wedge defect, suggesting ongoing ischemia. Flat panel CT of  the head was obtained and post processed in a separate workstation with concurrent attending physician supervision. Selected images were sent to PACS. No evidence of hemorrhagic transformation. The catheter was subsequently withdrawn. Right common femoral artery angiogram was obtained in right anterior oblique view. The puncture is at the level of the common femoral artery. The artery has normal caliber, adequate for closure device. The sheath was exchanged over the wire for an 8 [place] Angio-Seal which was utilized for access closure. Immediate hemostasis was achieved. IMPRESSION: Mechanical thrombectomy performed for treatment of a left M3/MCA posterior division branch occlusion achieving adequate recanalization (TICI 2b). Persistent occlusion of a posterior parietal cortical branch noted. No hemorrhagic complication. PLAN: Transferred to ICU for continued post stroke care. Electronically Signed   By: Pedro Earls M.D.   On: 03/25/2022 17:18   IR US Guide Vasc Access Right  Result Date: 03/25/2022 INDICATION: 87 year old female past medical history significant for anxiety, arthritis, CAD, GERD, heart murmur, hemorrhoids, HLD, HTN, afib on eliquis, TAVR, and takotsubo cardiomyopathy; baseline modified Rankin scale 1. She presented with aphasia; NIHSS 4. Her last known well was 9:30 p.m. on 03/24/2022. Head CT showed no large acute territorial infarct or hemorrhage. CT angiogram of the head and neck showed an occlusion of a left M3 branch with a 12 mL ischemic penumbra on CT perfusion. She was transferred to our service for emergency mechanical thrombectomy. EXAM: ULTRASOUND-GUIDED VASCULAR ACCESS  DIAGNOSTIC CEREBRAL ANGIOGRAM MECHANICAL FLAT PANEL HEAD CT COMPARISON:  CT/CT angiogram of the head and neck March 25, 2022. MEDICATIONS: No antibiotics administered. ANESTHESIA/SEDATION: The procedure was performed under general anesthesia. CONTRAST:  75 mL of Omnipaque 300 milligram/mL  FLUOROSCOPY: Radiation Exposure Index (as provided by the fluoroscopic device): 9628 mGy Kerma COMPLICATIONS: None immediate. TECHNIQUE: Informed written consent was obtained from the patient's daughter after a thorough discussion of the procedural risks, benefits and alternatives. All questions were addressed. Maximal Sterile Barrier Technique was utilized including caps, mask, sterile gowns, sterile gloves, sterile drape, hand hygiene and skin antiseptic. A timeout was performed prior to the initiation of the procedure. The right groin was prepped and draped in the usual sterile fashion. Using a micropuncture kit and the modified Seldinger technique, access was gained to the right common femoral artery and an 8 French sheath was placed. Real-time ultrasound guidance was utilized for vascular access including the acquisition of a permanent ultrasound image documenting patency of the accessed vessel. Under fluoroscopy, a Zoom 88 guide catheter was navigated over a 6 [place] VTK catheter and a 0.035" Terumo Glidewire into the aortic arch. The catheter was placed into the left common carotid artery and then advanced into the left internal carotid artery. The diagnostic catheter was removed. Frontal and lateral angiograms of the head were obtained. FINDINGS: 1. Atherosclerotic changes of the right common femoral artery with normal caliber, adequate for vascular access. 2. There is an occlusion of a left M3/MCA posterior division branch with wedge defects noted in the posterior parietal area. PROCEDURE: Using biplane roadmap, a Zoom 35 aspiration catheter was navigated over an Aristotle 24 microguidewire into the cavernous segment of the left ICA. The aspiration catheter was then advanced to the level of occlusion in the left M3 segment and connected to an aspiration pump. Continuous aspiration was performed for 2 minutes. The guide catheter was connected to a VacLok syringe. The aspiration catheter was subsequently  removed under constant aspiration. The guide catheter was aspirated for debris. Left internal carotid artery angiogram with magnified frontal lateral views of the head showed recanalization of the left M3 branch with mild vasospasm. A small wedge defect is noted in the posterior parietal region. Follow-up angiogram showed worsening of vasospasm with filling defect in the proximal aspect of the M3 branch. Using biplane roadmap guidance, a phenom 21 microcatheter and a Aristotle 14 microguidewire into the cavernous segment of the left ICA. The microcatheter was then navigated over the wire into the left M3/MCA branch. Then, 5 mg of verapamil were infused within the M3 branch. Follow-up angiogram showed worsening of the vasospasm with associated filling defect. Using biplane roadmap guidance, a phenom 21 microcatheter and a Aristotle 14 microguidewire into the cavernous segment of the left ICA. The microcatheter was then navigated over the wire into the left M3 segment. Then, a 3 mm solitaire stent retriever was deployed spanning the M3 segment. The device was allowed to intercalated with the clot for 4 minutes. The thrombectomy device was then removed. Follow-up angiograms with magnified frontal and lateral views of the head showed complete recanalization of that M3 branch with persistent small wedge defect in the posterior parietal region. Arterial opacification via collateral flow is noted in the venous phase at the region of the arterial phase wedge defect, consistent with retrograde arterial filling via collaterals. Multiple magnified angiograms were obtained with different projections. However origin of the branch was not identified. The mild early draining vein noted in the region of the  wedge defect, suggesting ongoing ischemia. Flat panel CT of the head was obtained and post processed in a separate workstation with concurrent attending physician supervision. Selected images were sent to PACS. No evidence of  hemorrhagic transformation. The catheter was subsequently withdrawn. Right common femoral artery angiogram was obtained in right anterior oblique view. The puncture is at the level of the common femoral artery. The artery has normal caliber, adequate for closure device. The sheath was exchanged over the wire for an 8 [place] Angio-Seal which was utilized for access closure. Immediate hemostasis was achieved. IMPRESSION: Mechanical thrombectomy performed for treatment of a left M3/MCA posterior division branch occlusion achieving adequate recanalization (TICI 2b). Persistent occlusion of a posterior parietal cortical branch noted. No hemorrhagic complication. PLAN: Transferred to ICU for continued post stroke care. Electronically Signed   By: Pedro Earls M.D.   On: 03/25/2022 17:18   IR CT Head Ltd  Result Date: 03/25/2022 INDICATION: 87 year old female past medical history significant for anxiety, arthritis, CAD, GERD, heart murmur, hemorrhoids, HLD, HTN, afib on eliquis, TAVR, and takotsubo cardiomyopathy; baseline modified Rankin scale 1. She presented with aphasia; NIHSS 4. Her last known well was 9:30 p.m. on 03/24/2022. Head CT showed no large acute territorial infarct or hemorrhage. CT angiogram of the head and neck showed an occlusion of a left M3 branch with a 12 mL ischemic penumbra on CT perfusion. She was transferred to our service for emergency mechanical thrombectomy. EXAM: ULTRASOUND-GUIDED VASCULAR ACCESS DIAGNOSTIC CEREBRAL ANGIOGRAM MECHANICAL FLAT PANEL HEAD CT COMPARISON:  CT/CT angiogram of the head and neck March 25, 2022. MEDICATIONS: No antibiotics administered. ANESTHESIA/SEDATION: The procedure was performed under general anesthesia. CONTRAST:  75 mL of Omnipaque 300 milligram/mL FLUOROSCOPY: Radiation Exposure Index (as provided by the fluoroscopic device): 9407 mGy Kerma COMPLICATIONS: None immediate. TECHNIQUE: Informed written consent was obtained from the  patient's daughter after a thorough discussion of the procedural risks, benefits and alternatives. All questions were addressed. Maximal Sterile Barrier Technique was utilized including caps, mask, sterile gowns, sterile gloves, sterile drape, hand hygiene and skin antiseptic. A timeout was performed prior to the initiation of the procedure. The right groin was prepped and draped in the usual sterile fashion. Using a micropuncture kit and the modified Seldinger technique, access was gained to the right common femoral artery and an 8 French sheath was placed. Real-time ultrasound guidance was utilized for vascular access including the acquisition of a permanent ultrasound image documenting patency of the accessed vessel. Under fluoroscopy, a Zoom 88 guide catheter was navigated over a 6 [place] VTK catheter and a 0.035" Terumo Glidewire into the aortic arch. The catheter was placed into the left common carotid artery and then advanced into the left internal carotid artery. The diagnostic catheter was removed. Frontal and lateral angiograms of the head were obtained. FINDINGS: 1. Atherosclerotic changes of the right common femoral artery with normal caliber, adequate for vascular access. 2. There is an occlusion of a left M3/MCA posterior division branch with wedge defects noted in the posterior parietal area. PROCEDURE: Using biplane roadmap, a Zoom 35 aspiration catheter was navigated over an Aristotle 24 microguidewire into the cavernous segment of the left ICA. The aspiration catheter was then advanced to the level of occlusion in the left M3 segment and connected to an aspiration pump. Continuous aspiration was performed for 2 minutes. The guide catheter was connected to a VacLok syringe. The aspiration catheter was subsequently removed under constant aspiration. The guide catheter was aspirated for debris. Left  internal carotid artery angiogram with magnified frontal lateral views of the head showed  recanalization of the left M3 branch with mild vasospasm. A small wedge defect is noted in the posterior parietal region. Follow-up angiogram showed worsening of vasospasm with filling defect in the proximal aspect of the M3 branch. Using biplane roadmap guidance, a phenom 21 microcatheter and a Aristotle 14 microguidewire into the cavernous segment of the left ICA. The microcatheter was then navigated over the wire into the left M3/MCA branch. Then, 5 mg of verapamil were infused within the M3 branch. Follow-up angiogram showed worsening of the vasospasm with associated filling defect. Using biplane roadmap guidance, a phenom 21 microcatheter and a Aristotle 14 microguidewire into the cavernous segment of the left ICA. The microcatheter was then navigated over the wire into the left M3 segment. Then, a 3 mm solitaire stent retriever was deployed spanning the M3 segment. The device was allowed to intercalated with the clot for 4 minutes. The thrombectomy device was then removed. Follow-up angiograms with magnified frontal and lateral views of the head showed complete recanalization of that M3 branch with persistent small wedge defect in the posterior parietal region. Arterial opacification via collateral flow is noted in the venous phase at the region of the arterial phase wedge defect, consistent with retrograde arterial filling via collaterals. Multiple magnified angiograms were obtained with different projections. However origin of the branch was not identified. The mild early draining vein noted in the region of the wedge defect, suggesting ongoing ischemia. Flat panel CT of the head was obtained and post processed in a separate workstation with concurrent attending physician supervision. Selected images were sent to PACS. No evidence of hemorrhagic transformation. The catheter was subsequently withdrawn. Right common femoral artery angiogram was obtained in right anterior oblique view. The puncture is at the  level of the common femoral artery. The artery has normal caliber, adequate for closure device. The sheath was exchanged over the wire for an 8 [place] Angio-Seal which was utilized for access closure. Immediate hemostasis was achieved. IMPRESSION: Mechanical thrombectomy performed for treatment of a left M3/MCA posterior division branch occlusion achieving adequate recanalization (TICI 2b). Persistent occlusion of a posterior parietal cortical branch noted. No hemorrhagic complication. PLAN: Transferred to ICU for continued post stroke care. Electronically Signed   By: Pedro Earls M.D.   On: 03/25/2022 17:18   CT ANGIO HEAD NECK W WO CM W PERF (CODE STROKE)  Result Date: 03/25/2022 CLINICAL DATA:  Provided history: Neuro deficit, acute, stroke suspected. EXAM: CT ANGIOGRAPHY HEAD AND NECK CT PERFUSION BRAIN TECHNIQUE: Multidetector CT imaging of the head and neck was performed using the standard protocol during bolus administration of intravenous contrast. Multiplanar CT image reconstructions and MIPs were obtained to evaluate the vascular anatomy. Carotid stenosis measurements (when applicable) are obtained utilizing NASCET criteria, using the distal internal carotid diameter as the denominator. Multiphase CT imaging of the brain was performed following IV bolus contrast injection. Subsequent parametric perfusion maps were calculated using RAPID software. RADIATION DOSE REDUCTION: This exam was performed according to the departmental dose-optimization program which includes automated exposure control, adjustment of the mA and/or kV according to patient size and/or use of iterative reconstruction technique. CONTRAST:  126mL OMNIPAQUE IOHEXOL 350 MG/ML SOLN COMPARISON:  Noncontrast head CT performed earlier today 03/25/2022. FINDINGS: CTA NECK FINDINGS Aortic arch: Standard aortic branching. Atherosclerotic plaque within the visualized aortic arch and proximal major branch vessels of the neck. No  hemodynamically significant innominate or proximal subclavian  artery stenosis. Right carotid system: CCA and ICA patent within the neck. Prominent calcified plaque about the carotid bifurcation and within the proximal ICA. Severe stenosis at the origin of the ICA (estimated to be 80-90%). Left carotid system: CCA and ICA patent within the neck without hemodynamically significant stenosis (50% or greater). Atherosclerotic plaque scattered within the CCA, about the carotid bifurcation and within the proximal ICA. Vertebral arteries: The non dominant right vertebral artery is developmentally diminutive, but patent throughout the neck. The dominant left vertebral artery is patent within the neck. Mild atherosclerotic narrowing at the origin of this vessel. Skeleton: Grade 1 anterolisthesis at C2-C3, C3-C4, C4-C5. Grade 1 retrolisthesis at C5-C6. Grade 1 anterolisthesis at C6-C7 and C7-T1. Cervical spondylosis. Multilevel spinal canal narrowing. Most notably at C5-C6, a posterior disc osteophyte complex contributes to apparent moderate spinal canal stenosis. Multilevel bony neural foraminal narrowing. Disc degeneration is advanced at C4-C5, C5-C6 and C6-C7. Other neck: No neck mass or cervical lymphadenopathy. Upper chest: Bronchiectasis and scattered tree-in-bud opacities within the bilateral upper lobes. The tree-in-bud opacities likely reflect sequelae of chronic infection. Biapical pleuroparenchymal scarring. Review of the MIP images confirms the above findings CTA HEAD FINDINGS Anterior circulation: The intracranial internal carotid arteries are patent. Nonstenotic calcified plaque within both vessels. The M1 middle cerebral arteries are patent. Abrupt occlusion of a proximal to mid M2 left MCA vessel (series 14, image 27) (series 9, image 91). The anterior cerebral arteries are patent. No intracranial aneurysm is identified. Posterior circulation: Diminutive intracranial right vertebral artery with superimposed  high-grade atherosclerotic stenosis distally. The dominant intracranial left vertebral artery is patent without stenosis. The basilar artery is patent. The posterior cerebral arteries are patent. Atherosclerotic irregularity of both vessels without high-grade proximal stenosis. The posterior cerebral arteries are fetal in origin, bilaterally. Venous sinuses: Within the limitations of contrast timing, no convincing thrombus. Anatomic variants: As described. Review of the MIP images confirms the above findings CT Brain Perfusion Findings: CBF (<30%) Volume: 52m Perfusion (Tmax>6.0s) volume: 164m(left MCA vascular territory) Mismatch Volume: 1278mnfarction Location:None identified CTA head impression #1 called by telephone at the time of interpretation on 03/25/2022 at 12:15 pm to provider Dr. KirLeonel Ramsayho verbally acknowledged these results. IMPRESSION: CTA neck: 1. The common carotid and internal carotid arteries are patent within the neck. Atherosclerotic plaque, bilaterally. Most notably, there is a severe (estimated 80-90% stenosis) at the origin of the right ICA. 2. Vertebral arteries patent within the neck. Mild atherosclerotic narrowing at the origin of the dominant left vertebral artery. 3.  Aortic Atherosclerosis (ICD10-I70.0). 4. Bronchiectasis and scattered tree-in-bud pulmonary opacities within the bilateral upper lobes. The tree-in-bud opacities likely reflect sequelae of chronic infection. 5. Cervical spondylosis. CTA head: 1. Occluded proximal-to-mid M2 left MCA vessel. 2. Additional intracranial atherosclerotic disease, as described. Most notably, there is a severe stenosis within the distal V4 right vertebral artery (the non-dominant vertebral artery). CT perfusion head: The perfusion software identifies a 12 mL region of critically hypoperfused parenchyma within the left MCA vascular territory. No core infarct is identified. Reported mismatch volume: 12 mL. Electronically Signed   By: KylKellie SimmeringO.   On: 03/25/2022 12:19   CT HEAD CODE STROKE WO CONTRAST  Result Date: 03/25/2022 CLINICAL DATA:  Code stroke. Neuro deficit, acute, stroke suspected. EXAM: CT HEAD WITHOUT CONTRAST TECHNIQUE: Contiguous axial images were obtained from the base of the skull through the vertex without intravenous contrast. RADIATION DOSE REDUCTION: This exam was performed according to the departmental dose-optimization  program which includes automated exposure control, adjustment of the mA and/or kV according to patient size and/or use of iterative reconstruction technique. COMPARISON:  Head CT 12/27/2020. FINDINGS: Brain: Mild generalized parenchymal atrophy. Redemonstrated small chronic infarct within the right cerebellar hemisphere (series 2, image 10). Tiny infarct within the left cerebellar hemisphere, new from the prior head CT of 12/27/2020 but otherwise age-indeterminate (series 5, image 51) (series 6, image 42). There is no acute intracranial hemorrhage. No demarcated cortical infarct. No extra-axial fluid collection. No evidence of an intracranial mass. No midline shift. Vascular: Atherosclerotic calcifications. Calcific focus within the left sylvian fissure, new from prior exams and concerning for a calcified embolus within an M2 left MCA vessel (series 5, image 31). Skull: No fracture or aggressive osseous lesion. Sinuses/Orbits: No mass or acute finding within the imaged orbits. Mild mucosal thickening within the right maxillary sinus at the imaged levels. Small to moderate-volume secretions within the left maxillary sinus at the imaged levels. Small-volume secretions within the right sphenoid sinus. Minimal mucosal thickening within the anterior ethmoid air cells, bilaterally. ASPECTS [facility] Stroke Program Early CT Score) - Ganglionic level infarction (caudate, lentiform nuclei, internal capsule, insula, M1-M3 cortex): 7 - Supraganglionic infarction (M4-M6 cortex): 3 Total score (0-10 with 10 being  normal): 10 Pertinent results were communicated to Dr. Leonie Man At 11:23 amon 9/1/2023by text page via the [facility] messaging system. IMPRESSION: 1. No acute intracranial hemorrhage or acute demarcated cortical infarct. 2. Suspected calcified embolus within a left M2 MCA vessel, as described. Correlate with findings on pending CTA head/neck. 3. Tiny infarct within the left cerebellar hemisphere, new from the prior head CT of 12/27/2020 but otherwise age-indeterminate. 4. 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 5. Mild generalized parenchymal atrophy. 6. Paranasal sinus disease at the imaged levels, as described. Electronically Signed   By: Kellie Simmering D.O.   On: 03/25/2022 11:27     PHYSICAL EXAM  Temp:  [97.5 F (36.4 C)-99.3 F (37.4 C)] 99.2 F (37.3 C) (09/02 0800) Pulse Rate:  [61-95] 89 (09/02 0815) Resp:  [12-35] 22 (09/02 0900) BP: (111-194)/(47-122) 131/76 (09/02 0900) SpO2:  [75 %-100 %] 85 % (09/02 0815) Weight:  [58.4 kg] 58.4 kg (09/01 1100)  General - Well nourished, well developed, in no apparent distress.  Ophthalmologic - fundi not visualized due to noncooperation.  Cardiovascular - Regular rhythm and rate, not in afib during round.  Neuro - awake, alert, eyes open, mild to moderate expressive aphasia, fluent language at times with meaningful sentences, but not able to tell me her age or "hospital", but able to chose hospital when given choices. Told me her daughter in wrong name. Also has partial receptive aphasia, not able to follow two step commands. Able to name 2/4 and able to repeat half of the simple sentences. No gaze palsy, tracking bilaterally, visual field full but with wrong number of fingers. No facial droop. Tongue midline. Bilateral UEs 5/5, no drift. Bilaterally LEs 5/5, no drift. Sensation symmetrical bilaterally subjectively, L FTN intact, R FTN ataxic, HTS testing not cooperative, gait not tested.    ASSESSMENT/PLAN [name] is a 87 y.o. 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 No tPA given due to on Eliquis and outside window.    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  Plan to start Pradaxa tomorrow. Ongoing aggressive stroke risk factor management Therapy recommendations: CIR Disposition: Pending  PAF Now on NSR on tele Eliquis 2.5 twice daily at home, stated compliance Likely failed Eliquis this time Will switch to Pradaxa tomorrow  Hypertension Stable BP goal < 180/105 Long term BP goal normotensive  Hyperlipidemia Home meds: Crestor 5 LDL 103, goal < 70 Now on Crestor 20 Continue statin at discharge  Other Stroke Risk Factors Advanced age Coronary artery disease Status post TAVR 07/2021 Takotsubo cardiomyopathy  Other Active Problems none  Hospital day # 1  This patient is critically ill due to left MCA stroke status post IR, A-fib and at significant risk of neurological worsening, death form recurrent stroke, hemorrhagic transformation, A-fib RVR, heart failure. This patient's care requires constant monitoring of vital signs, hemodynamics, respiratory and cardiac monitoring, review of multiple databases, neurological assessment, discussion with family, other specialists and medical decision making of high complexity. I spent 45  minutes of neurocritical care time in the care of this patient. I had long discussion with patient and daughter at bedside, updated pt current condition, treatment plan and potential prognosis, and answered all the questions.  They expressed understanding and appreciation.    Rosalin Hawking, MD PhD Stroke Neurology 03/26/2022 10:36 AM    To contact Stroke Continuity provider, please refer to [email or web address] After hours, contact General Neurology

## 2022-03-26 NOTE — Evaluation (Signed)
Physical Therapy Evaluation Patient Details Name: Barbara Mcconnell MRN: [id number] DOB: [birth year] Today's Date: 03/26/2022  History of Present Illness  Pt is an 87 y.o. female who presented 03/25/22 with AMS and aphasia. Outside window for tPA. S/p mechanical thrombectomy 9/1. MRI revealed left MCA territory ischemia, additional tiny scattered cortical infarcts at the left MCA/PCA watershed, solitary lesion in the right superior frontal gyrus, and small chronic right cerebellar infarcts. PMH: anxiety, aortic stenosis, arthritis, s/p TAVR 08/03/21, CAD, heart murmur, HLD, HTN, PAF   Clinical Impression  Pt presents with condition above and deficits mentioned below, see PT Problem List. PTA, she was living alone in a 1-level house with a level entry, functioning independently. Pt's daughter can assist pt intermittently, but is limited by needing to assist her husband s/p upcoming hip surgery. Pt is currently requiring up to minA for functional mobility and is at risk for falls. She demonstrates deficits in activity tolerance, balance, communication, cognition, and R-sided coordination, sensation, and potentially proprioception. As she has had a drastic functional decline and can tolerate 3 hours of therapy a day, she would greatly benefit from intensive therapy in the AIR setting and hopefully can get to a supervision or mod I level. Will continue to follow acutely.      Recommendations for follow up therapy are one component of a multi-disciplinary discharge planning process, led by the attending physician.  Recommendations may be updated based on patient status, additional functional criteria and insurance authorization.  Follow Up Recommendations Acute inpatient rehab (3hours/day)      Assistance Recommended at Discharge Frequent or constant Supervision/Assistance  Patient can return home with the following  A little help with walking and/or transfers;A lot of help with  bathing/dressing/bathroom;Assistance with cooking/housework;Direct supervision/assist for medications management;Direct supervision/assist for financial management;Assist for transportation    Equipment Recommendations Rolling walker (2 wheels)  Recommendations for Other Services  Rehab consult    Functional Status Assessment Patient has had a recent decline in their functional status and demonstrates the ability to make significant improvements in function in a reasonable and predictable amount of time.     Precautions / Restrictions Precautions Precautions: Fall Precaution Comments: SBP 120-140 Restrictions Weight Bearing Restrictions: No      Mobility  Bed Mobility Overal bed mobility: Needs Assistance Bed Mobility: Supine to Sit     Supine to sit: Min guard, HOB elevated     General bed mobility comments: Min guard for safety, HOB elevated    Transfers Overall transfer level: Needs assistance Equipment used: Rolling walker (2 wheels) Transfers: Sit to/from Stand Sit to Stand: Min assist           General transfer comment: MinA for stability coming to stand from EOB, cuing pt to push up from bed but pt pulling up on RW.    Ambulation/Gait Ambulation/Gait assistance: Min assist Gait Distance (Feet): 100 Feet Assistive device: Rolling walker (2 wheels), None Gait Pattern/deviations: Step-through pattern, Decreased stride length, Decreased dorsiflexion - right, Ataxic, Narrow base of support Gait velocity: reduced Gait velocity interpretation: <1.8 ft/sec, indicate of risk for recurrent falls   General Gait Details: Very mild ataxic gait pattern with inconsistent placement and clearance of R foot. Slows gait significantly when cued to change head positions. Pt with lateral trunk sway and needs minA for stability when ambulating with RW and without UE support.  Stairs            Wheelchair Mobility    Modified Rankin (Stroke Patients Only) Modified  Rankin (Stroke Patients Only) Pre-Morbid Rankin Score: No symptoms Modified Rankin: Moderately severe disability     Balance Overall balance assessment: Needs assistance Sitting-balance support: No upper extremity supported, Feet supported Sitting balance-Leahy Scale: Fair Sitting balance - Comments: Able to reach off BOS to try to donn socks, but mild instability noted, up to minA for safety   Standing balance support: Bilateral upper extremity supported, No upper extremity supported, During functional activity Standing balance-Leahy Scale: Poor Standing balance comment: Reliant on external physical assistance to prevent LOB                             Pertinent Vitals/Pain Pain Assessment Pain Assessment: Faces Faces Pain Scale: No hurt Pain Intervention(s): Monitored during session    Home Living Family/patient expects to be discharged to:: Private residence Living Arrangements: Alone Available Help at Discharge: Family;Available PRN/intermittently Type of Home: House Home Access: Level entry       Home Layout: One level Home Equipment: Cane - single point;Tub bench Additional Comments: daughter is only child of pt and reports inability to take care of pt 24/7 due to needing to care for her husband who is about to have hip surgery, her son recently was in a MVC, and her father recently passed away    Prior Function Prior Level of Function : Independent/Modified Independent                     Hand Dominance   Dominant Hand: Right    Extremity/Trunk Assessment   Upper Extremity Assessment Upper Extremity Assessment: Defer to OT evaluation    Lower Extremity Assessment Lower Extremity Assessment: RLE deficits/detail;Difficult to assess due to impaired cognition RLE Deficits / Details: appeared to have 4+ to 5/5 strength in bil legs, but difficult to assess fully due to impaired cognition/following commands; appeared to have sensation,  coordination, and proprioception deficits with functional mobility assessment RLE Sensation: decreased light touch;decreased proprioception RLE Coordination: decreased fine motor;decreased gross motor    Cervical / Trunk Assessment Cervical / Trunk Assessment: Normal  Communication   Communication: Expressive difficulties  Cognition Arousal/Alertness: Awake/alert Behavior During Therapy: Restless Overall Cognitive Status: Impaired/Different from baseline Area of Impairment: Attention, Memory, Following commands, Safety/judgement, Awareness, Problem solving                   Current Attention Level: Selective Memory: Decreased short-term memory Following Commands: Follows one step commands with increased time, Follows one step commands inconsistently, Follows multi-step commands inconsistently Safety/Judgement: Decreased awareness of safety, Decreased awareness of deficits Awareness: Emergent Problem Solving: Slow processing, Difficulty sequencing, Requires verbal cues, Requires tactile cues General Comments: Difficult to fully assess orientation and cognition due to communication deficits. Pt easily distracted, needing redirecting. Follows simple commands ~75% of time, but has difficulty with multi-step commands. Slow processing noted. Hyperfocused on wanting to see MD and get cleaned up. Difficulty motor planning how to put on socks and how to brush teeth, placing toothpaste in mouth instead.        General Comments General comments (skin integrity, edema, etc.): VSS on RA; daughter present and supportive    Exercises     Assessment/Plan    PT Assessment Patient needs continued PT services  PT Problem List Decreased activity tolerance;Decreased balance;Decreased mobility;Decreased coordination;Decreased cognition;Decreased knowledge of use of DME;Decreased safety awareness;Impaired sensation       PT Treatment Interventions DME instruction;Gait training;Functional  mobility training;Therapeutic activities;Therapeutic exercise;Balance training;Neuromuscular  re-education;Cognitive remediation;Patient/family education    PT Goals (Current goals can be found in the Care Plan section)  Acute Rehab PT Goals Patient Stated Goal: to get cleaned up PT Goal Formulation: With patient/family Time For Goal Achievement: 04/09/22 Potential to Achieve Goals: Good    Frequency Min 4X/week     Co-evaluation               AM-PAC PT "6 Clicks" Mobility  Outcome Measure Help needed turning from your back to your side while in a flat bed without using bedrails?: A Little Help needed moving from lying on your back to sitting on the side of a flat bed without using bedrails?: A Little Help needed moving to and from a bed to a chair (including a wheelchair)?: A Little Help needed standing up from a chair using your arms (e.g., wheelchair or bedside chair)?: A Little Help needed to walk in hospital room?: A Little Help needed climbing 3-5 steps with a railing? : A Lot 6 Click Score: 17    End of Session Equipment Utilized During Treatment: Gait belt Activity Tolerance: Patient tolerated treatment well Patient left: in chair;with call bell/phone within reach;with chair alarm set;with family/visitor present Nurse Communication: Mobility status PT Visit Diagnosis: Unsteadiness on feet (R26.81);Other abnormalities of gait and mobility (R26.89);Muscle weakness (generalized) (M62.81);Difficulty in walking, not elsewhere classified (R26.2);Ataxic gait (R26.0);Other symptoms and signs involving the nervous system (R29.898)    Time: [id number] PT Time Calculation (min) (ACUTE ONLY): 44 min   Charges:   PT Evaluation $PT Eval Moderate Complexity: 1 Mod PT Treatments $Gait Training: 8-22 mins $Therapeutic Activity: 8-22 mins        Moishe Spice, PT, DPT Acute Rehabilitation Services  Office: [place] 03/26/2022, 11:02 AM

## 2022-03-26 NOTE — Evaluation (Signed)
Occupational Therapy Evaluation Patient Details Name: Barbara Mcconnell MRN: [id number] DOB: Jul 17, 1935 Today's Date: 03/26/2022   History of Present Illness Pt is an 87 y.o. female who presented 03/25/22 with AMS and aphasia. Outside window for tPA. S/p mechanical thrombectomy 9/1. MRI revealed left MCA territory ischemia, additional tiny scattered cortical infarcts at the left MCA/PCA watershed, solitary lesion in the right superior frontal gyrus, and small chronic right cerebellar infarcts. 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  PTA she lives alone, and did not need assist with ADL, iADL, or mobility.  Deficits impacting independence are listed below.  Currently she is needing extensive safety cues and up to Mod A for lower body ADL, and Min A for basic mobility with RW.  Patient becomes fixated on objects or topics, and is difficult to redirect.  She has a lack of insight to her deficits, has difficulty with problem solving and comprehension.  OT will follow in the acute setting, but intensive rehab at [facility] is recommended.  Patient can easily participate with 3+ hour of rehab, and will benefit from a multi disciplined OT/PT/ST approach.       Recommendations for follow up therapy are one component of a multi-disciplinary discharge planning process, led by the attending physician.  Recommendations may be updated based on patient status, additional functional criteria and insurance authorization.   Follow Up Recommendations  Acute inpatient rehab (3hours/day)    Assistance Recommended at Discharge Frequent or constant Supervision/Assistance  Patient can return home with the following A lot of help with bathing/dressing/bathroom;A little help with walking and/or transfers;Direct supervision/assist for medications management;Direct supervision/assist for financial management;Assist for  transportation;Assistance with cooking/housework;Help with stairs or ramp for entrance    Functional Status Assessment  Patient has had a recent decline in their functional status and demonstrates the ability to make significant improvements in function in a reasonable and predictable amount of time.  Equipment Recommendations  BSC/3in1;Tub/shower seat    Recommendations for Other Services Rehab consult     Precautions / Restrictions Precautions Precautions: Fall Precaution Comments: SBP 120-140 Restrictions Weight Bearing Restrictions: No      Mobility Bed Mobility               General bed mobility comments: up in recliner    Transfers Overall transfer level: Needs assistance Equipment used: Rolling walker (2 wheels) Transfers: Sit to/from Stand Sit to Stand: Min assist           General transfer comment: poor safety and recognition of deficits      Balance Overall balance assessment: Needs assistance Sitting-balance support: No upper extremity supported, Feet supported Sitting balance-Leahy Scale: Fair     Standing balance support: Bilateral upper extremity supported, No upper extremity supported, During functional activity Standing balance-Leahy Scale: Poor                             ADL either performed or assessed with clinical judgement   ADL Overall ADL's : Needs assistance/impaired Eating/Feeding: Minimal assistance;Sitting   Grooming: Wash/dry face;Minimal assistance;Sitting   Upper Body Bathing: Minimal assistance;Sitting   Lower Body Bathing: Minimal assistance;Moderate assistance;Sit to/from stand;Cueing for sequencing   Upper Body Dressing : Minimal assistance;Moderate assistance;Sitting   Lower Body Dressing: Moderate assistance;Sit to/from stand;Cueing for compensatory techniques;Cueing for sequencing   Toilet Transfer: Minimal Holiday representative;Ambulation  Vision   Vision  Assessment?: Vision impaired- to be further tested in functional context Additional Comments: Inconsistent answers to vision questions, denies double vision, but having difficulty locating objects in front of her.  ? cognition interferring.     Perception Perception Perception: Impaired   Praxis Praxis Praxis: Impaired Praxis Impairment Details: Ideomotor;Motor planning;Perseveration    Pertinent Vitals/Pain Pain Assessment Pain Assessment: No/denies pain Pain Intervention(s): Monitored during session     Hand Dominance Right   Extremity/Trunk Assessment Upper Extremity Assessment Upper Extremity Assessment: RUE deficits/detail RUE Deficits / Details: patient has AROM, but appears to be ataxic. RUE Sensation: WNL RUE Coordination: decreased fine motor   Lower Extremity Assessment Lower Extremity Assessment: Defer to PT evaluation   Cervical / Trunk Assessment Cervical / Trunk Assessment: Normal   Communication Communication Communication: Expressive difficulties   Cognition Arousal/Alertness: Awake/alert Behavior During Therapy: Anxious, Restless Overall Cognitive Status: Impaired/Different from baseline Area of Impairment: Attention, Memory, Following commands, Safety/judgement, Awareness, Problem solving                   Current Attention Level: Selective Memory: Decreased short-term memory Following Commands: Follows one step commands with increased time, Follows one step commands inconsistently, Follows multi-step commands inconsistently Safety/Judgement: Decreased awareness of safety, Decreased awareness of deficits Awareness: Emergent Problem Solving: Slow processing, Difficulty sequencing, Requires verbal cues, Requires tactile cues       General Comments   VSS    Exercises     Shoulder Instructions      Home Living Family/patient expects to be discharged to:: Private residence Living Arrangements: Alone Available Help at Discharge:  Family;Available PRN/intermittently Type of Home: House Home Access: Level entry     Home Layout: One level     Bathroom Shower/Tub: Chief Strategy Officer: Handicapped height Bathroom Accessibility: No   Home Equipment: Cane - single point;Tub bench          Prior Functioning/Environment Prior Level of Function : Independent/Modified Independent             Mobility Comments: Mod indep ambulating with SPC ADLs Comments: independent        OT Problem List: Decreased strength;Impaired balance (sitting and/or standing);Impaired UE functional use;Decreased safety awareness;Decreased cognition;Decreased coordination      OT Treatment/Interventions: Self-care/ADL training;Therapeutic activities;Cognitive remediation/compensation;Patient/family education;Balance training;DME and/or AE instruction    OT Goals(Current goals can be found in the care plan section) Acute Rehab OT Goals Patient Stated Goal: None stated, patient fixated on callbell OT Goal Formulation: With patient Time For Goal Achievement: 04/08/22 Potential to Achieve Goals: Good ADL Goals Pt Will Perform Grooming: with supervision;standing Pt Will Perform Upper Body Dressing: with supervision;sitting Pt Will Perform Lower Body Dressing: with supervision;sit to/from stand Pt Will Transfer to Toilet: with supervision;ambulating;regular height toilet  OT Frequency: Min 2X/week    Co-evaluation              AM-PAC OT "6 Clicks" Daily Activity     Outcome Measure Help from another person eating meals?: A Little Help from another person taking care of personal grooming?: A Little Help from another person toileting, which includes using toliet, bedpan, or urinal?: A Lot Help from another person bathing (including washing, rinsing, drying)?: A Lot Help from another person to put on and taking off regular upper body clothing?: A Lot Help from another person to put on and taking off regular  lower body clothing?: A Lot 6 Click Score: 14   End of Session  Equipment Utilized During Treatment: Rolling walker (2 wheels) Nurse Communication: Mobility status  Activity Tolerance: Patient tolerated treatment well Patient left: in chair;with call bell/phone within reach;with chair alarm set  OT Visit Diagnosis: Unsteadiness on feet (R26.81);Ataxia, unspecified (R27.0);Other symptoms and signs involving cognitive function                Time: 1236-1250 OT Time Calculation (min): 14 min Charges:  OT General Charges $OT Visit: 1 Visit OT Evaluation $OT Eval Moderate Complexity: 1 Mod  03/26/2022  Barbara Mcconnell, Barbara Mcconnell  Acute Rehabilitation Services  Office:  [phone]   Barbara Mcconnell 03/26/2022, 1:30 PM

## 2022-03-26 NOTE — Progress Notes (Signed)
  Echocardiogram 2D Echocardiogram has been performed.  Barbara Mcconnell 03/26/2022, 3:26 PM

## 2022-03-26 NOTE — Progress Notes (Signed)
ANTICOAGULATION CONSULT NOTE - Initial Consult  Pharmacy Consult for Dabigatran Indication: atrial fibrillation  Allergies  Allergen Reactions   Codeine Nausea And Vomiting   Cyclobenzaprine    Iodinated Contrast Media    Prednisone Other (See Comments)    "Sores in the mouth"    Patient Measurements: Weight: 58.4 kg (128 lb 12 oz)  Vital Signs: Temp: 99.2 F (37.3 C) (09/02 0800) Temp Source: Axillary (09/02 0800) BP: 131/76 (09/02 0900) Pulse Rate: 89 (09/02 0815)  Labs: Recent Labs    03/25/22 1059 03/25/22 1105 03/26/22 0137  HGB 10.6* 11.6* 10.1*  HCT 32.5* 34.0* 31.2*  PLT 245  --  243  APTT 23*  --   --   LABPROT 13.8  --   --   INR 1.1  --   --   CREATININE 0.95 0.90 0.80    Estimated Creatinine Clearance: 41.1 mL/min (by C-G formula based on SCr of 0.8 mg/dL).   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 Lynnette Caffey & Dr. Laneta Simmers   Seasonal allergies    Takotsubo cardiomyopathy     Medications:  Medications Prior to Admission  Medication Sig Dispense Refill Last Dose   acetaminophen (TYLENOL) 500 MG tablet Take 500-1,000 mg by mouth every 6 (six) hours as needed for moderate pain.      albuterol (VENTOLIN HFA) 108 (90 Base) MCG/ACT inhaler Inhale 2 puffs into the lungs every 4 (four) hours as needed for wheezing or shortness of breath (seasonal allergies).       apixaban (ELIQUIS) 2.5 MG TABS tablet Take 1 tablet (2.5 mg total) by mouth 2 (two) times daily. 180 tablet 3     Cholecalciferol (VITAMIN D3 PO) Take 1 tablet by mouth daily.      diltiazem (CARDIZEM CD) 180 MG 24 hr capsule Take 180 mg by mouth as needed (Elevated heart rate).      docusate sodium (COLACE) 100 MG capsule Take 100 mg by mouth daily with breakfast.      escitalopram (LEXAPRO) 5 MG tablet Take 1 tablet (5 mg total) by mouth daily. 90 tablet 2    fexofenadine (ALLEGRA) 60 MG tablet Take 60 mg by mouth daily.      furosemide (LASIX) 20 MG tablet Take 0.5 tablets (10 mg total) by mouth 2 (two) times daily. 120 tablet 3    lisinopril (ZESTRIL) 5 MG tablet Take 1.25 mg by mouth daily.      melatonin 5 MG TABS Take 5 mg by mouth at bedtime.      metoprolol tartrate (LOPRESSOR) 25 MG tablet TAKE ONE TABLET BY MOUTH TWICE A DAY 180 tablet 2    montelukast (SINGULAIR) 10 MG tablet Take 1 tablet (10 mg total) by mouth daily after supper. 90 tablet 3    Multiple Vitamin (MULTIVITAMIN WITH MINERALS) TABS tablet Take 1 tablet by mouth daily. Centrum 50 plus  Multiple Vitamins-Minerals (EMERGEN-C VITAMIN C PO) Take 1 packet by mouth.      Omega-3 Fatty Acids (FISH OIL PO) Take 1 capsule by mouth daily.       omeprazole (PRILOSEC) 20 MG capsule Take 20 mg by mouth daily before breakfast.      rosuvastatin (CRESTOR) 5 MG tablet Take 1 tablet (5 mg total) by mouth daily. 90 tablet 1    Trolamine Salicylate (ASPERCREME EX) Apply 1 application topically daily as needed (knee pain).       Assessment: 87 y.o. F presents with stroke - s/p mechanical thrombectomy 9/1. Pt on apixaban 2.5mg  po BID pta for afib and pt and daughter note compliance. Now to start dabigatran on 9/3 per neurology. Hgb down 10.1. plt wnl.  Goal of Therapy:  Prevention of CVA Monitor platelets by anticoagulation protocol: Yes   Plan:  Dabigatran 150mg  po BID starting 9/3 F/u s/s bleeding and CBC Will check with pt advocate specialist technician re: copay for dabigatran  11/3, PharmD, BCPS Please see amion for complete  clinical pharmacist phone list 03/26/2022,11:24 AM

## 2022-03-27 DIAGNOSIS — I6521 Occlusion and stenosis of right carotid artery: Secondary | ICD-10-CM

## 2022-03-27 DIAGNOSIS — Z7901 Long term (current) use of anticoagulants: Secondary | ICD-10-CM | POA: Diagnosis not present

## 2022-03-27 DIAGNOSIS — I63412 Cerebral infarction due to embolism of left middle cerebral artery: Secondary | ICD-10-CM | POA: Diagnosis not present

## 2022-03-27 DIAGNOSIS — I480 Paroxysmal atrial fibrillation: Secondary | ICD-10-CM | POA: Diagnosis not present

## 2022-03-27 LAB — CBC
HCT: 30.4 % — ABNORMAL LOW (ref 36.0–46.0)
Hemoglobin: 9.9 g/dL — ABNORMAL LOW (ref 12.0–15.0)
MCH: 29.2 pg (ref 26.0–34.0)
MCHC: 32.6 g/dL (ref 30.0–36.0)
MCV: 89.7 fL (ref 80.0–100.0)
Platelets: 236 10*3/uL (ref 150–400)
RBC: 3.39 MIL/uL — ABNORMAL LOW (ref 3.87–5.11)
RDW: 13.6 % (ref 11.5–15.5)
WBC: 7.7 10*3/uL (ref 4.0–10.5)
nRBC: 0 % (ref 0.0–0.2)

## 2022-03-27 LAB — BASIC METABOLIC PANEL
Anion gap: 13 (ref 5–15)
BUN: 11 mg/dL (ref 8–23)
CO2: 24 mmol/L (ref 22–32)
Calcium: 9.4 mg/dL (ref 8.9–10.3)
Chloride: 98 mmol/L (ref 98–111)
Creatinine, Ser: 0.88 mg/dL (ref 0.44–1.00)
GFR, Estimated: >60 mL/min (ref >60)
Glucose, Bld: 106 mg/dL — ABNORMAL HIGH (ref 70–99)
Potassium: 3.5 mmol/L (ref 3.5–5.1)
Sodium: 135 mmol/L (ref 135–145)

## 2022-03-27 MED ORDER — DILTIAZEM HCL ER COATED BEADS 180 MG PO CP24: 180.0000 mg | ORAL_CAPSULE | ORAL | Status: DC | PRN

## 2022-03-27 MED ORDER — MONTELUKAST SODIUM 10 MG PO TABS
10.0000 mg | ORAL_TABLET | Freq: Every day | ORAL | Status: DC
  Administered 2022-03-27 – 2022-03-29 (×3): 10 mg via ORAL
  Filled 2022-03-27 (×3): qty 1

## 2022-03-27 MED ORDER — ESCITALOPRAM OXALATE 10 MG PO TABS
5.0000 mg | ORAL_TABLET | Freq: Every day | ORAL | Status: DC
  Administered 2022-03-27 – 2022-03-30 (×4): 5 mg via ORAL
  Filled 2022-03-27 (×4): qty 1

## 2022-03-27 MED ORDER — LORATADINE 10 MG PO TABS
10.0000 mg | ORAL_TABLET | Freq: Every day | ORAL | Status: DC
  Administered 2022-03-28 – 2022-03-30 (×3): 10 mg via ORAL
  Filled 2022-03-27 (×4): qty 1

## 2022-03-27 MED ORDER — LORAZEPAM 1 MG PO TABS: 2.0000 mg | ORAL_TABLET | Freq: Four times a day (QID) | ORAL | Status: DC | PRN

## 2022-03-27 MED ORDER — MELATONIN 5 MG PO TABS
5.0000 mg | ORAL_TABLET | Freq: Every day | ORAL | Status: DC
  Administered 2022-03-27 – 2022-03-29 (×3): 5 mg via ORAL
  Filled 2022-03-27 (×3): qty 1

## 2022-03-27 MED ORDER — FUROSEMIDE 20 MG PO TABS
10.0000 mg | ORAL_TABLET | Freq: Two times a day (BID) | ORAL | Status: DC
  Administered 2022-03-27 – 2022-03-30 (×6): 10 mg via ORAL
  Filled 2022-03-27 (×6): qty 1

## 2022-03-27 MED ORDER — LISINOPRIL 2.5 MG PO TABS
1.2500 mg | ORAL_TABLET | Freq: Every day | ORAL | Status: DC
  Administered 2022-03-27 – 2022-03-30 (×4): 1.25 mg via ORAL
  Filled 2022-03-27 (×4): qty 1

## 2022-03-27 NOTE — Progress Notes (Signed)
STROKE TEAM PROGRESS NOTE   SUBJECTIVE (INTERVAL HISTORY) Her daughter is at the bedside.  Patient was seen working with PT/OT, walking in the hallway without difficulty.  Still has speech deficit.  PT therapy recommend CIR, however daughter not able to provide 24/7 supervision at home, will need SNF most likely.   OBJECTIVE Temp:  [97.7 F (36.5 C)-98.9 F (37.2 C)] 97.7 F (36.5 C) (09/03 1225) Pulse Rate:  [72-80] 76 (09/03 1225) Cardiac Rhythm: Normal sinus rhythm (09/03 0704) Resp:  [16-19] 16 (09/03 1225) BP: (139-184)/(54-77) 139/54 (09/03 1225) SpO2:  [93 %-99 %] 97 % (09/03 1225) Weight:  [53.3 kg] 53.3 kg (09/02 2200)  Recent Labs  Lab 03/25/22 1057  GLUCAP 100*   Recent Labs  Lab 03/25/22 1059 03/25/22 1105 03/26/22 0137 03/27/22 0406  NA 133* 134* 133* 135  K 3.9 3.9 3.5 3.5  CL 101 99 99 98  CO2 26  --  26 24  GLUCOSE 100* 96 110* 106*  BUN 21 23 11 11   CREATININE 0.95 0.90 0.80 0.88  CALCIUM 9.2  --  8.9 9.4   Recent Labs  Lab 03/25/22 1059  AST 46*  ALT 35  ALKPHOS 54  BILITOT 1.2  PROT 6.5  ALBUMIN 3.6   Recent Labs  Lab 03/25/22 1059 03/25/22 1105 03/26/22 0137 03/27/22 0406  WBC 5.7  --  8.3 7.7  NEUTROABS 2.9  --   --   --   HGB 10.6* 11.6* 10.1* 9.9*  HCT 32.5* 34.0* 31.2* 30.4*  MCV 90.0  --  91.0 89.7  PLT 245  --  243 236   No results for input(s): "CKTOTAL", "CKMB", "CKMBINDEX", "TROPONINI" in the last 168 hours. Recent Labs    03/25/22 1059  LABPROT 13.8  INR 1.1   No results for input(s): "COLORURINE", "LABSPEC", "PHURINE", "GLUCOSEU", "HGBUR", "BILIRUBINUR", "KETONESUR", "PROTEINUR", "UROBILINOGEN", "NITRITE", "LEUKOCYTESUR" in the last 72 hours.  Invalid input(s): "APPERANCEUR"     Component Value Date/Time   CHOL 204 (H) 03/26/2022 0137   TRIG 77 03/26/2022 0137   HDL 86 03/26/2022 0137   CHOLHDL 2.4 03/26/2022 0137   VLDL 15 03/26/2022 0137   LDLCALC 103 (H) 03/26/2022 0137   LDLCALC 95 02/15/2022 1119    Lab Results  Component Value Date   HGBA1C 5.4 03/26/2022      Component Value Date/Time   LABOPIA NONE DETECTED 05/13/2019 1056   COCAINSCRNUR NONE DETECTED 05/13/2019 1056   LABBENZ NONE DETECTED 05/13/2019 1056   AMPHETMU NONE DETECTED 05/13/2019 1056   THCU NONE DETECTED 05/13/2019 1056   LABBARB NONE DETECTED 05/13/2019 1056    Recent Labs  Lab 03/25/22 1104  ETH <10    I have personally reviewed the radiological images below and agree with the radiology interpretations.  ECHOCARDIOGRAM COMPLETE  Result Date: 03/26/2022    ECHOCARDIOGRAM REPORT   Patient Name:   Barbara Mcconnell Date of Exam: 03/26/2022 Medical Rec #:  [id number]    Height:       60.0 in Accession #:    [id number]   Weight:       128.7 lb Date of Birth:  October 17, 1934     BSA:          1.548 m Patient Age:    33 years     BP:           125/66 mmHg Patient Gender: F            HR:  69 bpm. Exam Location:  Inpatient Procedure: 2D Echo, Cardiac Doppler and Color Doppler Indications:    Stroke  History:        Patient has prior history of Echocardiogram examinations, most                 recent 09/10/2021. CAD, Aortic Valve Disease, Arrythmias:Atrial                 Fibrillation; Risk Factors:Hypertension, Dyslipidemia and Former                 Smoker.                 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 Procedure Date: 08/03/21.  Sonographer:    Clayton Lefort RDCS (AE) Referring Phys: [id number] [place]  1. Left ventricular ejection fraction, by estimation, is 65 to 70%. The left ventricle has normal function. The left ventricle has no regional wall motion abnormalities. There is moderate left ventricular hypertrophy. Left ventricular diastolic parameters are indeterminate.  2. Right ventricular systolic function is normal. The right ventricular size is normal. Tricuspid regurgitation signal is inadequate for assessing PA pressure.  3. Left  atrial size was moderately dilated.  4. The mitral valve is degenerative. Trivial mitral valve regurgitation. Mild mitral stenosis. The mean mitral valve gradient is 5.0 mmHg with average heart rate of 68 bpm. Severe mitral annular calcification.  5. The abnormal color flow signal seen previously below the prosthetic aortic valve is seen again on this exam, color flow in multiple views suggestive of valvular regurgitation, though it cannot be demonstrated with spectral Doppler, which would support color flow artifact, possibly from stent frame of valve. No evidence of prosthetic valve obstruction or significant regurgitation.     The aortic valve has been repaired/replaced. Aortic valve regurgitation is mild. 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 Procedure Date: 08/03/21. Echo findings are consistent with likely stable structure and function of the aortic valve prosthesis. Aortic valve area, by VTI measures 2.58 cm. Aortic valve mean gradient measures 11.0 mmHg. Aortic valve Vmax measures 1.98 m/s. Aortic valve acceleration time measures 85 msec.  6. The inferior vena cava is normal in size with greater than 50% respiratory variability, suggesting right atrial pressure of 3 mmHg. FINDINGS  Left Ventricle: Left ventricular ejection fraction, by estimation, is 65 to 70%. The left ventricle has normal function. The left ventricle has no regional wall motion abnormalities. The left ventricular internal cavity size was normal in size. There is  moderate left ventricular hypertrophy. Left ventricular diastolic parameters are indeterminate. Right Ventricle: The right ventricular size is normal. No increase in right ventricular wall thickness. Right ventricular systolic function is normal. Tricuspid regurgitation signal is inadequate for assessing PA pressure. Left Atrium: Left atrial size was moderately dilated. Right Atrium: Right atrial size was normal in size. Pericardium: There  is no evidence of pericardial effusion. Mitral Valve: The mitral valve is degenerative in appearance. Severe mitral annular calcification. Trivial mitral valve regurgitation. Mild mitral valve stenosis. MV peak gradient, 13.1 mmHg. The mean mitral valve gradient is 5.0 mmHg with average heart rate of 68 bpm. Tricuspid Valve: The tricuspid valve is normal in structure. Tricuspid valve regurgitation is mild . No evidence of tricuspid stenosis. Aortic Valve: The abnormal color flow signal seen  previously below the prosthetic aortic valve is seen again on this exam, color flow in multiple views suggestive of valvular regurgitation, though it cannot be demonstrated with spectral Doppler, which would support color flow artifact, possibly from stent frame of valve. No evidence of prosthetic valve obstruction or significant regurgitation. The aortic valve has been repaired/replaced. Aortic valve regurgitation is mild. Aortic valve mean gradient measures 11.0 mmHg. Aortic valve peak gradient measures 15.8 mmHg. Aortic valve area, by VTI measures 2.58 cm. 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 Procedure Date: 08/03/21. Echo findings are consistent  with normal structure and function of the aortic valve prosthesis. Pulmonic Valve: The pulmonic valve was normal in structure. Pulmonic valve regurgitation is trivial. No evidence of pulmonic stenosis. Aorta: The aortic root is normal in size and structure. Venous: The inferior vena cava is normal in size with greater than 50% respiratory variability, suggesting right atrial pressure of 3 mmHg. IAS/Shunts: No atrial level shunt detected by color flow Doppler.  LEFT VENTRICLE PLAX 2D LVIDd:         4.30 cm   Diastology LVIDs:         2.20 cm   LV e' medial:    4.46 cm/s LV PW:         1.40 cm   LV E/e' medial:  27.6 LV IVS:        1.30 cm   LV e' lateral:   6.20 cm/s LVOT diam:     2.30 cm   LV E/e' lateral: 19.8 LV SV:         128  LV SV Index:   83 LVOT Area:     4.15 cm  RIGHT VENTRICLE             IVC RV Basal diam:  2.70 cm     IVC diam: 1.40 cm RV S prime:     13.70 cm/s TAPSE (M-mode): 2.7 cm LEFT ATRIUM             Index        RIGHT ATRIUM           Index LA diam:        4.60 cm 2.97 cm/m   RA Area:     17.10 cm LA Vol (A2C):   76.2 ml 49.23 ml/m  RA Volume:   44.40 ml  28.68 ml/m LA Vol (A4C):   76.5 ml 49.42 ml/m LA Biplane Vol: 76.6 ml 49.48 ml/m  AORTIC VALVE AV Area (Vmax):    2.99 cm AV Area (Vmean):   2.47 cm AV Area (VTI):     2.58 cm AV Vmax:           198.46 cm/s AV Vmean:          163.147 cm/s AV VTI:            0.495 m AV Peak Grad:      15.8 mmHg AV Mean Grad:      11.0 mmHg LVOT Vmax:         143.00 cm/s LVOT Vmean:        96.800 cm/s LVOT VTI:          0.308 m LVOT/AV VTI ratio: 0.62  AORTA Ao Root diam: 3.30 cm MITRAL VALVE MV Area (PHT): 2.09 cm     SHUNTS MV Area VTI:   2.05 cm     Systemic VTI:  0.31 m MV Peak grad:  13.1 mmHg    Systemic Diam: 2.30 cm MV Mean grad:  5.0 mmHg MV Vmax:       1.81 m/s MV Vmean:      100.0 cm/s MV Decel Time: 363 msec MV E velocity: 123.00 cm/s MV A velocity: 159.00 cm/s MV E/A ratio:  0.77 Cherlynn Kaiser MD Electronically signed by Cherlynn Kaiser MD Signature Date/Time: 03/26/2022/4:09:16 PM    Final    MR BRAIN WO CONTRAST  Result Date: 03/26/2022 CLINICAL DATA:  87 year old female code stroke presentation. Left MCA M2/M3 branch occlusion status post endovascular reperfusion. EXAM: MRI HEAD WITHOUT CONTRAST TECHNIQUE: Multiplanar, multiecho pulse sequences of the brain and surrounding structures were obtained without intravenous contrast. COMPARISON:  CT head, CTA, CTP yesterday. FINDINGS: Brain: Posterior left sylvian fissure and lateral perirolandic region restricted diffusion corresponding to T-max abnormality on CTP yesterday. This is in an area of about 3.5 cm (series 5, image 76 and series 7, image 44). Some additional cortical restricted diffusion in the left  insula, and multiple tiny additional cortical infarcts scattered in both the anterior left frontal lobe (series 5, image 77) and left MCA/PCA watershed including the superior occipital pole (image 71). There is also a tiny contralateral right superior frontal gyrus pre motor cortical focus of restricted diffusion series 5, image 84. No other contralateral restricted diffusion. No posterior fossa restricted diffusion. T2 and FLAIR hyperintense cytotoxic edema in the affected areas. No acute intracranial hemorrhage identified. No midline shift, mass effect, or evidence of intracranial mass lesion. No ventriculomegaly. Normal for age cerebral volume. Negative pituitary and cervicomedullary junction. Minimal for age scattered nonspecific cerebral white matter T2 and FLAIR hyperintensity. There are several small chronic infarcts in the right cerebellum AICA or SCA territory (series 10, image 7). Vascular: Major intracranial vascular flow voids are preserved. Dominant distal left vertebral artery as seen yesterday. Skull and upper cervical spine: Multilevel degenerative appearing cervical spine spondylolisthesis, disc degeneration. Hyperostosis of the calvarium. Visualized bone marrow signal is within normal limits. Sinuses/Orbits: Some leftward gaze. Stable mild paranasal sinus mucosal thickening and/or fluid. Other: Mastoids are clear. Grossly normal visible internal auditory structures. Negative visible scalp and face. IMPRESSION: 1. Left MCA territory ischemia, most confluent in the area of the T-max abnormality yesterday. Additional tiny scattered cortical infarcts also at the left MCA/PCA watershed, and a solitary lesion in the right superior frontal gyrus. 2. No associated hemorrhage or mass effect. 3. Small chronic right cerebellar infarcts. Electronically Signed   By: Genevie Ann M.D.   On: 03/26/2022 05:01   IR PERCUTANEOUS ART THROMBECTOMY/INFUSION INTRACRANIAL INC DIAG ANGIO  Result Date: 03/25/2022 INDICATION:  87 year old female past medical history significant for anxiety, arthritis, CAD, GERD, heart murmur, hemorrhoids, HLD, HTN, afib on eliquis, TAVR, and takotsubo cardiomyopathy; baseline modified Rankin scale 1. She presented with aphasia; NIHSS 4. Her last known well was 9:30 p.m. on 03/24/2022. Head CT showed no large acute territorial infarct or hemorrhage. CT angiogram of the head and neck showed an occlusion of a left M3 branch with a 12 mL ischemic penumbra on CT perfusion. She was transferred to our service for emergency mechanical thrombectomy. EXAM: ULTRASOUND-GUIDED VASCULAR ACCESS DIAGNOSTIC CEREBRAL ANGIOGRAM MECHANICAL FLAT PANEL HEAD CT COMPARISON:  CT/CT angiogram of the head and neck March 25, 2022. MEDICATIONS: No antibiotics administered. ANESTHESIA/SEDATION: The procedure was performed under general anesthesia. CONTRAST:  75 mL of Omnipaque 300 milligram/mL FLUOROSCOPY: Radiation Exposure Index (as provided by the fluoroscopic device): 8786 mGy Kerma COMPLICATIONS: None immediate. TECHNIQUE: Informed written  consent was obtained from the patient's daughter after a thorough discussion of the procedural risks, benefits and alternatives. All questions were addressed. Maximal Sterile Barrier Technique was utilized including caps, mask, sterile gowns, sterile gloves, sterile drape, hand hygiene and skin antiseptic. A timeout was performed prior to the initiation of the procedure. The right groin was prepped and draped in the usual sterile fashion. Using a micropuncture kit and the modified Seldinger technique, access was gained to the right common femoral artery and an 8 French sheath was placed. Real-time ultrasound guidance was utilized for vascular access including the acquisition of a permanent ultrasound image documenting patency of the accessed vessel. Under fluoroscopy, a Zoom 88 guide catheter was navigated over a 6 [place] VTK catheter and a 0.035" Terumo Glidewire into the aortic arch. The  catheter was placed into the left common carotid artery and then advanced into the left internal carotid artery. The diagnostic catheter was removed. Frontal and lateral angiograms of the head were obtained. FINDINGS: 1. Atherosclerotic changes of the right common femoral artery with normal caliber, adequate for vascular access. 2. There is an occlusion of a left M3/MCA posterior division branch with wedge defects noted in the posterior parietal area. PROCEDURE: Using biplane roadmap, a Zoom 35 aspiration catheter was navigated over an Aristotle 24 microguidewire into the cavernous segment of the left ICA. The aspiration catheter was then advanced to the level of occlusion in the left M3 segment and connected to an aspiration pump. Continuous aspiration was performed for 2 minutes. The guide catheter was connected to a VacLok syringe. The aspiration catheter was subsequently removed under constant aspiration. The guide catheter was aspirated for debris. Left internal carotid artery angiogram with magnified frontal lateral views of the head showed recanalization of the left M3 branch with mild vasospasm. A small wedge defect is noted in the posterior parietal region. Follow-up angiogram showed worsening of vasospasm with filling defect in the proximal aspect of the M3 branch. Using biplane roadmap guidance, a phenom 21 microcatheter and a Aristotle 14 microguidewire into the cavernous segment of the left ICA. The microcatheter was then navigated over the wire into the left M3/MCA branch. Then, 5 mg of verapamil were infused within the M3 branch. Follow-up angiogram showed worsening of the vasospasm with associated filling defect. Using biplane roadmap guidance, a phenom 21 microcatheter and a Aristotle 14 microguidewire into the cavernous segment of the left ICA. The microcatheter was then navigated over the wire into the left M3 segment. Then, a 3 mm solitaire stent retriever was deployed spanning the M3 segment.  The device was allowed to intercalated with the clot for 4 minutes. The thrombectomy device was then removed. Follow-up angiograms with magnified frontal and lateral views of the head showed complete recanalization of that M3 branch with persistent small wedge defect in the posterior parietal region. Arterial opacification via collateral flow is noted in the venous phase at the region of the arterial phase wedge defect, consistent with retrograde arterial filling via collaterals. Multiple magnified angiograms were obtained with different projections. However origin of the branch was not identified. The mild early draining vein noted in the region of the wedge defect, suggesting ongoing ischemia. Flat panel CT of the head was obtained and post processed in a separate workstation with concurrent attending physician supervision. Selected images were sent to PACS. No evidence of hemorrhagic transformation. The catheter was subsequently withdrawn. Right common femoral artery angiogram was obtained in right anterior oblique view. The puncture is at the level  of the common femoral artery. The artery has normal caliber, adequate for closure device. The sheath was exchanged over the wire for an 8 [place] Angio-Seal which was utilized for access closure. Immediate hemostasis was achieved. IMPRESSION: Mechanical thrombectomy performed for treatment of a left M3/MCA posterior division branch occlusion achieving adequate recanalization (TICI 2b). Persistent occlusion of a posterior parietal cortical branch noted. No hemorrhagic complication. PLAN: Transferred to ICU for continued post stroke care. Electronically Signed   By: Pedro Earls M.D.   On: 03/25/2022 17:18   IR US Guide Vasc Access Right  Result Date: 03/25/2022 INDICATION: 87 year old female past medical history significant for anxiety, arthritis, CAD, GERD, heart murmur, hemorrhoids, HLD, HTN, afib on eliquis, TAVR, and takotsubo cardiomyopathy;  baseline modified Rankin scale 1. She presented with aphasia; NIHSS 4. Her last known well was 9:30 p.m. on 03/24/2022. Head CT showed no large acute territorial infarct or hemorrhage. CT angiogram of the head and neck showed an occlusion of a left M3 branch with a 12 mL ischemic penumbra on CT perfusion. She was transferred to our service for emergency mechanical thrombectomy. EXAM: ULTRASOUND-GUIDED VASCULAR ACCESS DIAGNOSTIC CEREBRAL ANGIOGRAM MECHANICAL FLAT PANEL HEAD CT COMPARISON:  CT/CT angiogram of the head and neck March 25, 2022. MEDICATIONS: No antibiotics administered. ANESTHESIA/SEDATION: The procedure was performed under general anesthesia. CONTRAST:  75 mL of Omnipaque 300 milligram/mL FLUOROSCOPY: Radiation Exposure Index (as provided by the fluoroscopic device): 2229 mGy Kerma COMPLICATIONS: None immediate. TECHNIQUE: Informed written consent was obtained from the patient's daughter after a thorough discussion of the procedural risks, benefits and alternatives. All questions were addressed. Maximal Sterile Barrier Technique was utilized including caps, mask, sterile gowns, sterile gloves, sterile drape, hand hygiene and skin antiseptic. A timeout was performed prior to the initiation of the procedure. The right groin was prepped and draped in the usual sterile fashion. Using a micropuncture kit and the modified Seldinger technique, access was gained to the right common femoral artery and an 8 French sheath was placed. Real-time ultrasound guidance was utilized for vascular access including the acquisition of a permanent ultrasound image documenting patency of the accessed vessel. Under fluoroscopy, a Zoom 88 guide catheter was navigated over a 6 [place] VTK catheter and a 0.035" Terumo Glidewire into the aortic arch. The catheter was placed into the left common carotid artery and then advanced into the left internal carotid artery. The diagnostic catheter was removed. Frontal and lateral  angiograms of the head were obtained. FINDINGS: 1. Atherosclerotic changes of the right common femoral artery with normal caliber, adequate for vascular access. 2. There is an occlusion of a left M3/MCA posterior division branch with wedge defects noted in the posterior parietal area. PROCEDURE: Using biplane roadmap, a Zoom 35 aspiration catheter was navigated over an Aristotle 24 microguidewire into the cavernous segment of the left ICA. The aspiration catheter was then advanced to the level of occlusion in the left M3 segment and connected to an aspiration pump. Continuous aspiration was performed for 2 minutes. The guide catheter was connected to a VacLok syringe. The aspiration catheter was subsequently removed under constant aspiration. The guide catheter was aspirated for debris. Left internal carotid artery angiogram with magnified frontal lateral views of the head showed recanalization of the left M3 branch with mild vasospasm. A small wedge defect is noted in the posterior parietal region. Follow-up angiogram showed worsening of vasospasm with filling defect in the proximal aspect of the M3 branch. Using biplane roadmap guidance, a phenom 21  microcatheter and a Aristotle 14 microguidewire into the cavernous segment of the left ICA. The microcatheter was then navigated over the wire into the left M3/MCA branch. Then, 5 mg of verapamil were infused within the M3 branch. Follow-up angiogram showed worsening of the vasospasm with associated filling defect. Using biplane roadmap guidance, a phenom 21 microcatheter and a Aristotle 14 microguidewire into the cavernous segment of the left ICA. The microcatheter was then navigated over the wire into the left M3 segment. Then, a 3 mm solitaire stent retriever was deployed spanning the M3 segment. The device was allowed to intercalated with the clot for 4 minutes. The thrombectomy device was then removed. Follow-up angiograms with magnified frontal and lateral views  of the head showed complete recanalization of that M3 branch with persistent small wedge defect in the posterior parietal region. Arterial opacification via collateral flow is noted in the venous phase at the region of the arterial phase wedge defect, consistent with retrograde arterial filling via collaterals. Multiple magnified angiograms were obtained with different projections. However origin of the branch was not identified. The mild early draining vein noted in the region of the wedge defect, suggesting ongoing ischemia. Flat panel CT of the head was obtained and post processed in a separate workstation with concurrent attending physician supervision. Selected images were sent to PACS. No evidence of hemorrhagic transformation. The catheter was subsequently withdrawn. Right common femoral artery angiogram was obtained in right anterior oblique view. The puncture is at the level of the common femoral artery. The artery has normal caliber, adequate for closure device. The sheath was exchanged over the wire for an 8 [place] Angio-Seal which was utilized for access closure. Immediate hemostasis was achieved. IMPRESSION: Mechanical thrombectomy performed for treatment of a left M3/MCA posterior division branch occlusion achieving adequate recanalization (TICI 2b). Persistent occlusion of a posterior parietal cortical branch noted. No hemorrhagic complication. PLAN: Transferred to ICU for continued post stroke care. Electronically Signed   By: Pedro Earls M.D.   On: 03/25/2022 17:18   IR CT Head Ltd  Result Date: 03/25/2022 INDICATION: 87 year old female past medical history significant for anxiety, arthritis, CAD, GERD, heart murmur, hemorrhoids, HLD, HTN, afib on eliquis, TAVR, and takotsubo cardiomyopathy; baseline modified Rankin scale 1. She presented with aphasia; NIHSS 4. Her last known well was 9:30 p.m. on 03/24/2022. Head CT showed no large acute territorial infarct or hemorrhage. CT  angiogram of the head and neck showed an occlusion of a left M3 branch with a 12 mL ischemic penumbra on CT perfusion. She was transferred to our service for emergency mechanical thrombectomy. EXAM: ULTRASOUND-GUIDED VASCULAR ACCESS DIAGNOSTIC CEREBRAL ANGIOGRAM MECHANICAL FLAT PANEL HEAD CT COMPARISON:  CT/CT angiogram of the head and neck March 25, 2022. MEDICATIONS: No antibiotics administered. ANESTHESIA/SEDATION: The procedure was performed under general anesthesia. CONTRAST:  75 mL of Omnipaque 300 milligram/mL FLUOROSCOPY: Radiation Exposure Index (as provided by the fluoroscopic device): 8657 mGy Kerma COMPLICATIONS: None immediate. TECHNIQUE: Informed written consent was obtained from the patient's daughter after a thorough discussion of the procedural risks, benefits and alternatives. All questions were addressed. Maximal Sterile Barrier Technique was utilized including caps, mask, sterile gowns, sterile gloves, sterile drape, hand hygiene and skin antiseptic. A timeout was performed prior to the initiation of the procedure. The right groin was prepped and draped in the usual sterile fashion. Using a micropuncture kit and the modified Seldinger technique, access was gained to the right common femoral artery and an 8 French sheath was placed.  Real-time ultrasound guidance was utilized for vascular access including the acquisition of a permanent ultrasound image documenting patency of the accessed vessel. Under fluoroscopy, a Zoom 88 guide catheter was navigated over a 6 [place] VTK catheter and a 0.035" Terumo Glidewire into the aortic arch. The catheter was placed into the left common carotid artery and then advanced into the left internal carotid artery. The diagnostic catheter was removed. Frontal and lateral angiograms of the head were obtained. FINDINGS: 1. Atherosclerotic changes of the right common femoral artery with normal caliber, adequate for vascular access. 2. There is an occlusion of a left  M3/MCA posterior division branch with wedge defects noted in the posterior parietal area. PROCEDURE: Using biplane roadmap, a Zoom 35 aspiration catheter was navigated over an Aristotle 24 microguidewire into the cavernous segment of the left ICA. The aspiration catheter was then advanced to the level of occlusion in the left M3 segment and connected to an aspiration pump. Continuous aspiration was performed for 2 minutes. The guide catheter was connected to a VacLok syringe. The aspiration catheter was subsequently removed under constant aspiration. The guide catheter was aspirated for debris. Left internal carotid artery angiogram with magnified frontal lateral views of the head showed recanalization of the left M3 branch with mild vasospasm. A small wedge defect is noted in the posterior parietal region. Follow-up angiogram showed worsening of vasospasm with filling defect in the proximal aspect of the M3 branch. Using biplane roadmap guidance, a phenom 21 microcatheter and a Aristotle 14 microguidewire into the cavernous segment of the left ICA. The microcatheter was then navigated over the wire into the left M3/MCA branch. Then, 5 mg of verapamil were infused within the M3 branch. Follow-up angiogram showed worsening of the vasospasm with associated filling defect. Using biplane roadmap guidance, a phenom 21 microcatheter and a Aristotle 14 microguidewire into the cavernous segment of the left ICA. The microcatheter was then navigated over the wire into the left M3 segment. Then, a 3 mm solitaire stent retriever was deployed spanning the M3 segment. The device was allowed to intercalated with the clot for 4 minutes. The thrombectomy device was then removed. Follow-up angiograms with magnified frontal and lateral views of the head showed complete recanalization of that M3 branch with persistent small wedge defect in the posterior parietal region. Arterial opacification via collateral flow is noted in the venous  phase at the region of the arterial phase wedge defect, consistent with retrograde arterial filling via collaterals. Multiple magnified angiograms were obtained with different projections. However origin of the branch was not identified. The mild early draining vein noted in the region of the wedge defect, suggesting ongoing ischemia. Flat panel CT of the head was obtained and post processed in a separate workstation with concurrent attending physician supervision. Selected images were sent to PACS. No evidence of hemorrhagic transformation. The catheter was subsequently withdrawn. Right common femoral artery angiogram was obtained in right anterior oblique view. The puncture is at the level of the common femoral artery. The artery has normal caliber, adequate for closure device. The sheath was exchanged over the wire for an 8 [place] Angio-Seal which was utilized for access closure. Immediate hemostasis was achieved. IMPRESSION: Mechanical thrombectomy performed for treatment of a left M3/MCA posterior division branch occlusion achieving adequate recanalization (TICI 2b). Persistent occlusion of a posterior parietal cortical branch noted. No hemorrhagic complication. PLAN: Transferred to ICU for continued post stroke care. Electronically Signed   By: Pedro Earls M.D.   On:  03/25/2022 17:18   CT ANGIO HEAD NECK W WO CM W PERF (CODE STROKE)  Result Date: 03/25/2022 CLINICAL DATA:  Provided history: Neuro deficit, acute, stroke suspected. EXAM: CT ANGIOGRAPHY HEAD AND NECK CT PERFUSION BRAIN TECHNIQUE: Multidetector CT imaging of the head and neck was performed using the standard protocol during bolus administration of intravenous contrast. Multiplanar CT image reconstructions and MIPs were obtained to evaluate the vascular anatomy. Carotid stenosis measurements (when applicable) are obtained utilizing NASCET criteria, using the distal internal carotid diameter as the denominator. Multiphase CT  imaging of the brain was performed following IV bolus contrast injection. Subsequent parametric perfusion maps were calculated using RAPID software. RADIATION DOSE REDUCTION: This exam was performed according to the departmental dose-optimization program which includes automated exposure control, adjustment of the mA and/or kV according to patient size and/or use of iterative reconstruction technique. CONTRAST:  164m OMNIPAQUE IOHEXOL 350 MG/ML SOLN COMPARISON:  Noncontrast head CT performed earlier today 03/25/2022. FINDINGS: CTA NECK FINDINGS Aortic arch: Standard aortic branching. Atherosclerotic plaque within the visualized aortic arch and proximal major branch vessels of the neck. No hemodynamically significant innominate or proximal subclavian artery stenosis. Right carotid system: CCA and ICA patent within the neck. Prominent calcified plaque about the carotid bifurcation and within the proximal ICA. Severe stenosis at the origin of the ICA (estimated to be 80-90%). Left carotid system: CCA and ICA patent within the neck without hemodynamically significant stenosis (50% or greater). Atherosclerotic plaque scattered within the CCA, about the carotid bifurcation and within the proximal ICA. Vertebral arteries: The non dominant right vertebral artery is developmentally diminutive, but patent throughout the neck. The dominant left vertebral artery is patent within the neck. Mild atherosclerotic narrowing at the origin of this vessel. Skeleton: Grade 1 anterolisthesis at C2-C3, C3-C4, C4-C5. Grade 1 retrolisthesis at C5-C6. Grade 1 anterolisthesis at C6-C7 and C7-T1. Cervical spondylosis. Multilevel spinal canal narrowing. Most notably at C5-C6, a posterior disc osteophyte complex contributes to apparent moderate spinal canal stenosis. Multilevel bony neural foraminal narrowing. Disc degeneration is advanced at C4-C5, C5-C6 and C6-C7. Other neck: No neck mass or cervical lymphadenopathy. Upper chest:  Bronchiectasis and scattered tree-in-bud opacities within the bilateral upper lobes. The tree-in-bud opacities likely reflect sequelae of chronic infection. Biapical pleuroparenchymal scarring. Review of the MIP images confirms the above findings CTA HEAD FINDINGS Anterior circulation: The intracranial internal carotid arteries are patent. Nonstenotic calcified plaque within both vessels. The M1 middle cerebral arteries are patent. Abrupt occlusion of a proximal to mid M2 left MCA vessel (series 14, image 27) (series 9, image 91). The anterior cerebral arteries are patent. No intracranial aneurysm is identified. Posterior circulation: Diminutive intracranial right vertebral artery with superimposed high-grade atherosclerotic stenosis distally. The dominant intracranial left vertebral artery is patent without stenosis. The basilar artery is patent. The posterior cerebral arteries are patent. Atherosclerotic irregularity of both vessels without high-grade proximal stenosis. The posterior cerebral arteries are fetal in origin, bilaterally. Venous sinuses: Within the limitations of contrast timing, no convincing thrombus. Anatomic variants: As described. Review of the MIP images confirms the above findings CT Brain Perfusion Findings: CBF (<30%) Volume: 087mPerfusion (Tmax>6.0s) volume: 1266mleft MCA vascular territory) Mismatch Volume: 66m66mfarction Location:None identified CTA head impression #1 called by telephone at the time of interpretation on 03/25/2022 at 12:15 pm to provider Dr. KirkLeonel Ramsayo verbally acknowledged these results. IMPRESSION: CTA neck: 1. The common carotid and internal carotid arteries are patent within the neck. Atherosclerotic plaque, bilaterally. Most notably, there is a  severe (estimated 80-90% stenosis) at the origin of the right ICA. 2. Vertebral arteries patent within the neck. Mild atherosclerotic narrowing at the origin of the dominant left vertebral artery. 3.  Aortic  Atherosclerosis (ICD10-I70.0). 4. Bronchiectasis and scattered tree-in-bud pulmonary opacities within the bilateral upper lobes. The tree-in-bud opacities likely reflect sequelae of chronic infection. 5. Cervical spondylosis. CTA head: 1. Occluded proximal-to-mid M2 left MCA vessel. 2. Additional intracranial atherosclerotic disease, as described. Most notably, there is a severe stenosis within the distal V4 right vertebral artery (the non-dominant vertebral artery). CT perfusion head: The perfusion software identifies a 12 mL region of critically hypoperfused parenchyma within the left MCA vascular territory. No core infarct is identified. Reported mismatch volume: 12 mL. Electronically Signed   By: Kellie Simmering D.O.   On: 03/25/2022 12:19   CT HEAD CODE STROKE WO CONTRAST  Result Date: 03/25/2022 CLINICAL DATA:  Code stroke. Neuro deficit, acute, stroke suspected. EXAM: CT HEAD WITHOUT CONTRAST TECHNIQUE: Contiguous axial images were obtained from the base of the skull through the vertex without intravenous contrast. RADIATION DOSE REDUCTION: This exam was performed according to the departmental dose-optimization program which includes automated exposure control, adjustment of the mA and/or kV according to patient size and/or use of iterative reconstruction technique. COMPARISON:  Head CT 12/27/2020. FINDINGS: Brain: Mild generalized parenchymal atrophy. Redemonstrated small chronic infarct within the right cerebellar hemisphere (series 2, image 10). Tiny infarct within the left cerebellar hemisphere, new from the prior head CT of 12/27/2020 but otherwise age-indeterminate (series 5, image 51) (series 6, image 42). There is no acute intracranial hemorrhage. No demarcated cortical infarct. No extra-axial fluid collection. No evidence of an intracranial mass. No midline shift. Vascular: Atherosclerotic calcifications. Calcific focus within the left sylvian fissure, new from prior exams and concerning for a  calcified embolus within an M2 left MCA vessel (series 5, image 31). Skull: No fracture or aggressive osseous lesion. Sinuses/Orbits: No mass or acute finding within the imaged orbits. Mild mucosal thickening within the right maxillary sinus at the imaged levels. Small to moderate-volume secretions within the left maxillary sinus at the imaged levels. Small-volume secretions within the right sphenoid sinus. Minimal mucosal thickening within the anterior ethmoid air cells, bilaterally. ASPECTS [facility] Stroke Program Early CT Score) - Ganglionic level infarction (caudate, lentiform nuclei, internal capsule, insula, M1-M3 cortex): 7 - Supraganglionic infarction (M4-M6 cortex): 3 Total score (0-10 with 10 being normal): 10 Pertinent results were communicated to Dr. Leonie Man At 11:23 amon 9/1/2023by text page via the [facility] messaging system. IMPRESSION: 1. No acute intracranial hemorrhage or acute demarcated cortical infarct. 2. Suspected calcified embolus within a left M2 MCA vessel, as described. Correlate with findings on pending CTA head/neck. 3. Tiny infarct within the left cerebellar hemisphere, new from the prior head CT of 12/27/2020 but otherwise age-indeterminate. 4. 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 5. Mild generalized parenchymal atrophy. 6. Paranasal sinus disease at the imaged levels, as described. Electronically Signed   By: Kellie Simmering D.O.   On: 03/25/2022 11:27     PHYSICAL EXAM  Temp:  [97.7 F (36.5 C)-98.9 F (37.2 C)] 97.7 F (36.5 C) (09/03 1225) Pulse Rate:  [72-80] 76 (09/03 1225) Resp:  [16-19] 16 (09/03 1225) BP: (139-184)/(54-77) 139/54 (09/03 1225) SpO2:  [93 %-99 %] 97 % (09/03 1225) Weight:  [53.3 kg] 53.3 kg (09/02 2200)  General - Well nourished, well developed, in no apparent distress.  Ophthalmologic - fundi not visualized due to noncooperation.  Cardiovascular -  Regular rhythm and rate, not in afib during round.  Neuro -  awake, alert, eyes open, mild to moderate expressive aphasia with word finding difficulty, anomia and able to repeat first half a sentence only.  Also has partial receptive aphasia, not able to follow two step commands. No gaze palsy, tracking bilaterally, visual field full but with wrong number of fingers. No facial droop. Tongue midline. Bilateral UEs 5/5, no drift. Bilaterally LEs 5/5, no drift. Sensation symmetrical bilaterally subjectively, L FTN intact, R FTN ataxic, gait not tested.    ASSESSMENT/PLAN Barbara Mcconnell is a 87 y.o. 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 No tPA given due to on Eliquis and outside window.    Stroke:  left MCA infarct with left M2 occlusion s/p IR with reocclusion of left M3, embolic secondary to afib even on eliquis CT head showed left M2 hyperdense sign and old right cerebellar infarct CTA head and neck left M2 occlusion, right ICA 80 to 90% stenosis Status post IR with reocclusion of left M3 MRI left MCA moderate sized infarct 2D Echo EF 65 to 70%, likely stable structure and function of the aortic valve prosthesis LDL 103 HgbA1c 5.4 SCDs for VTE prophylaxis Eliquis (apixaban) 2.5 twice daily prior to admission, now on Pradaxa for stroke prevention. Ongoing aggressive stroke risk factor management Therapy recommendations: CIR. however daughter not able to provide 24/7 supervision at home after CIR, will need SNF most likely Disposition: Pending  PAF Now on NSR on tele Eliquis 2.5 twice daily at home, stated compliance Likely failed Eliquis this time Aspirin switch to Pradaxa today  Carotid stenosis Right ICA 80 to 90% stenosis on CTA Known chronic right ICA stenosis by PCP per daughter Follow-up as outpatient with PCP  Hypertension Stable BP goal < 180/105 Long term BP goal  normotensive  Hyperlipidemia Home meds: Crestor 5 LDL 103, goal < 70 Now on Crestor 20 Continue statin at discharge  Other Stroke Risk Factors Advanced age Coronary artery disease Status post TAVR 07/2021 Takotsubo cardiomyopathy  Other Active Problems none  Hospital day # 2  I had long discussion with daughter at bedside, updated pt current condition, treatment plan and potential prognosis, and answered all the questions.  She expressed understanding and appreciation.    Rosalin Hawking, MD PhD Stroke Neurology 03/27/2022 2:04 PM    To contact Stroke Continuity provider, please refer to [email or web address] After hours, contact General Neurology

## 2022-03-27 NOTE — TOC Initial Note (Signed)
Transition of Care [facility]) - Initial/Assessment Note    Patient Details  Name: Barbara Mcconnell MRN: [id number] Date of Birth: April 27, 1935  [facility] CM/SW Contact:    Tresa Endo Phone Number: 03/27/2022, 1:45 PM  Clinical Narrative:                 CSW received SNF consult. CSW met with pt daughter. CSW introduced self and explained role at the hospital. Pt reports that PTA the pt was living at home with self. PT reports pt is min assist walking 100 ft with a RW. Pt daughter is concerned about pt cognition and would like to include speech therapy in pt rehab.  CSW reviewed PT/OT recommendations for SNF. Pt gave CSW permission to fax out to facilities in the area. Pt has no preference of facility at this time but wants to ensure there will be some type of speech therapy while at the SNF. CSW informed pt daughter of the medicare.gov information bc she was not in the room at the time. CSW will follow up with SNF options when available.   CSW will continue to follow.    Expected Discharge Plan: Skilled Nursing Facility Barriers to Discharge: Continued Medical Work up   Patient Goals and CMS Choice Patient states their goals for this hospitalization and ongoing recovery are:: Rehab to get back to baseline and cognitive improvements CMS Medicare.gov Compare Post Acute Care list provided to:: Patient Choice offered to / list presented to : Patient, Adult Children  Expected Discharge Plan and Services Expected Discharge Plan: [place] In-house Referral: Clinical Social Work   Post Acute Care Choice: [place] Living arrangements for the past 2 months: [place]                                      Prior Living Arrangements/Services Living arrangements for the past 2 months: Single Family Home Lives with:: Self Patient language and need for interpreter reviewed:: Yes Do you feel safe going back to the place where you  live?: Yes      Need for Family Participation in Patient Care: Yes (Comment) Care giver support system in place?: Yes (comment)   Criminal Activity/Legal Involvement Pertinent to Current Situation/Hospitalization: No - Comment as needed  Activities of Daily Living      Permission Sought/Granted Permission sought to share information with : Facility Sport and exercise psychologist, Family Supports Permission granted to share information with : Yes, Verbal Permission Granted  Share Information with NAME: Gita, Dilger (Daughter)   [phone]  Permission granted to share info w AGENCY: SNF  Permission granted to share info w Relationship: Onyx, Edgley (Daughter)   [phone]  Permission granted to share info w Contact Information: Ilyanna, Baillargeon (Daughter)   [phone]  Emotional Assessment   Attitude/Demeanor/Rapport: Unable to Assess Affect (typically observed): Unable to Assess   Alcohol / Substance Use: Not Applicable Psych Involvement: No (comment)  Admission diagnosis:  Stroke [facility]) [I63.9] Patient Active Problem List   Diagnosis Date Noted   Stroke ([place]) 03/25/2022   Urinary tract bacterial infections 02/15/2022   Anxiety    Physical deconditioning 08/04/2021   PAF (paroxysmal atrial fibrillation) ([place]) 08/03/2021   S/P TAVR (transcatheter aortic valve replacement) 08/03/2021   Hyponatremia 07/30/2021   Fluid overload 07/22/2021   Bronchiectasis with acute exacerbation ([place]) 07/22/2021   Other cirrhosis of liver ([place]) 07/22/2021   Pericardial effusion 07/21/2021  Hypertension    Severe aortic stenosis 05/31/2019   Renal insufficiency 05/31/2019   CAD (coronary artery disease) 05/31/2019   Dyslipidemia, goal LDL below 70 05/13/2019   Carotid arterial disease ([place]) 05/13/2019   Takotsubo cardiomyopathy    PCP:  Wardell Honour, MD Pharmacy:   [facility] - [address], [place] [address] Phone:  [phone] Fax: [phone]  [id number] [place] [place] [address] Phone: [phone] Fax: [phone]     Social Determinants of Health (SDOH) Interventions    Readmission Risk Interventions    08/04/2021    3:35 PM  Readmission Risk Prevention Plan  Transportation Screening Complete  PCP or Specialist Appt within 5-7 Days Complete  Home Care Screening Complete  Medication Review (RN CM) Complete

## 2022-03-27 NOTE — Progress Notes (Signed)
Inpatient Rehab Admissions Coordinator:    I spoke with Pt.'s daughter who is Pt.'s only family that Pt. Is in contact with. She states that she wants her mother to get rehab but is not able to provide 24/7 care after CIR, which Pt. Is likely to need in light of aphasia and cognitive deficits. Following discussion, she feels SNF with the option to try and transition pt. To long term care makes the most sense. CIR will sign off and notify TOC that pt. Will need SNF.   Megan Salon, MS, CCC-SLP Rehab Admissions Coordinator  [phone] (celll) [phone] (office)

## 2022-03-27 NOTE — [place] FL2 (Addendum)
[place] MEDICAID FL2 LEVEL OF CARE SCREENING TOOL     IDENTIFICATION  Patient Name: Barbara Mcconnell Birthdate: Jun 16, 1935 Sex: female Admission Date (Current Location): 03/25/2022  [facility] [place] Number:  Producer, television/film/video and Address:  [facility], [address], [place], [place] [address]      Provider Number: [id number]  Attending Physician Name and Address:  Stroke, Md, MD  Relative Name and Phone Number:  Cordell, Coke (Daughter)   [phone]    Current Level of Care: Hospital Recommended Level of Care: Skilled Nursing Facility Prior Approval Number:    Date Approved/Denied:   PASRR Number: Pending  Discharge Plan: SNF    Current Diagnoses: Patient Active Problem List   Diagnosis Date Noted   Stroke (HCC) 03/25/2022   Urinary tract bacterial infections 02/15/2022   Anxiety    Physical deconditioning 08/04/2021   PAF (paroxysmal atrial fibrillation) (HCC) 08/03/2021   S/P TAVR (transcatheter aortic valve replacement) 08/03/2021   Hyponatremia 07/30/2021   Fluid overload 07/22/2021   Bronchiectasis with acute exacerbation (HCC) 07/22/2021   Other cirrhosis of liver (HCC) 07/22/2021   Pericardial effusion 07/21/2021   Hypertension    Severe aortic stenosis 05/31/2019   Renal insufficiency 05/31/2019   CAD (coronary artery disease) 05/31/2019   Dyslipidemia, goal LDL below 70 05/13/2019   Carotid arterial disease (HCC) 05/13/2019   Takotsubo cardiomyopathy     Orientation RESPIRATION BLADDER Height & Weight     Self, Time, Situation, Place  Normal Continent Weight: 117 lb 8.1 oz (53.3 kg) Height:     BEHAVIORAL SYMPTOMS/MOOD NEUROLOGICAL BOWEL NUTRITION STATUS      Continent Diet (See DC Summary)  AMBULATORY STATUS COMMUNICATION OF NEEDS Skin   Limited Assist Verbally Surgical wounds                       Personal Care Assistance Level of Assistance  Bathing, Feeding, Dressing Bathing Assistance: Limited  assistance Feeding Assistance: Limited assistance Dressing Assistance: Limited assistance     Functional Limitations Info  Sight, Hearing, Speech Sight Info: Adequate Hearing Info: Adequate Speech Info: Impaired     SPECIAL CARE FACTORS FREQUENCY  PT (By licensed PT), OT (By licensed OT)     PT Frequency: 5x a week OT Frequency: 5x a week            Contractures Contractures Info: Not present    Additional Factors Info  Code Status, Allergies Code Status Info: Full Allergies Info: Codeine   Cyclobenzaprine   Iodinated Contrast Media   Prednisone           Current Medications (03/27/2022):  This is the current hospital active medication list Current Facility-Administered Medications  Medication Dose Route Frequency Provider Last Rate Last Admin   acetaminophen (TYLENOL) tablet 650 mg  650 mg Oral Q4H PRN de Melchor Amour, Jerilynn Mages, MD       Or   acetaminophen (TYLENOL) 160 MG/5ML solution 650 mg  650 mg Per Tube Q4H PRN de Melchor Amour, Jerilynn Mages, MD       Or   acetaminophen (TYLENOL) suppository 650 mg  650 mg Rectal Q4H PRN de Melchor Amour, Jerilynn Mages, MD       clevidipine (CLEVIPREX) infusion 0.5 mg/mL  0-21 mg/hr Intravenous Continuous Marvel Plan, MD   Stopped at 03/26/22 0816   dabigatran (PRADAXA) capsule 150 mg  150 mg Oral Q12H Titus Mould, RPH   150 mg at 03/27/22 0819   escitalopram (LEXAPRO)  tablet 5 mg  5 mg Oral Daily Marvel Plan, MD   5 mg at 03/27/22 1049   furosemide (LASIX) tablet 10 mg  10 mg Oral BID Marvel Plan, MD       lisinopril (ZESTRIL) tablet 1.25 mg  1.25 mg Oral Daily Marvel Plan, MD   1.25 mg at 03/27/22 1221   melatonin tablet 5 mg  5 mg Oral QHS Marvel Plan, MD       metoprolol tartrate (LOPRESSOR) tablet 25 mg  25 mg Oral BID Marvel Plan, MD   25 mg at 03/27/22 0819   montelukast (SINGULAIR) tablet 10 mg  10 mg Oral QPC supper Marvel Plan, MD       pantoprazole (PROTONIX) EC tablet 40 mg  40 mg Oral Daily Marvel Plan, MD   40  mg at 03/27/22 0819   rosuvastatin (CRESTOR) tablet 20 mg  20 mg Oral Daily Marvel Plan, MD   20 mg at 03/27/22 7824   senna-docusate (Senokot-S) tablet 1 tablet  1 tablet Oral QHS PRN Elmer Picker, NP   1 tablet at 03/26/22 2136     Discharge Medications: Please see discharge summary for a list of discharge medications.  Relevant Imaging Results:  Relevant Lab Results:   Additional Information SSN: [id number], [facility] Patient   Ivette Loyal, [place]

## 2022-03-27 NOTE — Social Work (Signed)
RE: Barbara Mcconnell Date of Birth: 1935/05/05 Date: 03/27/2022  Please be advised that the above-named patient will require a short-term nursing home stay - anticipated 30 days or less for rehabilitation and strengthening.  The plan is for return home.

## 2022-03-27 NOTE — Progress Notes (Signed)
Physical Therapy Treatment Patient Details Name: Barbara Mcconnell MRN: [id number] DOB: 03/12/1935 Today's Date: 03/27/2022   History of Present Illness Pt is an 87 y.o. female who presented 03/25/22 with AMS and aphasia. Outside window for tPA. S/p mechanical thrombectomy 9/1. MRI revealed left MCA territory ischemia, additional tiny scattered cortical infarcts at the left MCA/PCA watershed, solitary lesion in the right superior frontal gyrus, and small chronic right cerebellar infarcts. PMH: anxiety, aortic stenosis, arthritis, s/p TAVR 08/03/21, CAD, heart murmur, HLD, HTN, PAF    PT Comments    The pt continues to make good progress with mobility goals at this time but remains limited by poor dynamic stability that is complicated by poor safety awareness and awareness of deficits. The pt requires repeated instructions and multimodal cues at times, and required up to minA to correct LOB with balance challenge. However, pt with no awareness of deficits, stating she "just needs to get out of here" and go home where she can resume her activities. The pt was unable to recall room number or complete wayfinding task back to her room using environmental cues. Would need consistent assist and supervision at home as pt is at significant risk of falls with mobility with no awareness of deficits. Continue to recommend acute inpatient rehab to maximize independence and safety prior to return home.   Dynamic Gait Index (DGI): 10/24 (<19 indicates increased risk for falls)    Recommendations for follow up therapy are one component of a multi-disciplinary discharge planning process, led by the attending physician.  Recommendations may be updated based on patient status, additional functional criteria and insurance authorization.  Follow Up Recommendations  Acute inpatient rehab (3hours/day)     Assistance Recommended at Discharge Frequent or constant Supervision/Assistance  Patient can return home with the following  A little help with walking and/or transfers;A lot of help with bathing/dressing/bathroom;Assistance with cooking/housework;Direct supervision/assist for medications management;Direct supervision/assist for financial management;Assist for transportation   Equipment Recommendations  Rolling walker (2 wheels)    Recommendations for Other Services       Precautions / Restrictions Precautions Precautions: Fall Precaution Comments: SBP 120-140 Restrictions Weight Bearing Restrictions: No     Mobility  Bed Mobility Overal bed mobility: Needs Assistance             General bed mobility comments: pt OOB upon arrival and returned to chair    Transfers Overall transfer level: Needs assistance Equipment used: None Transfers: Sit to/from Stand Sit to Stand: Supervision           General transfer comment: supervision for safety, pt impulsively standing    Ambulation/Gait Ambulation/Gait assistance: Min assist, Min guard Gait Distance (Feet): 100 Feet (+ 100 ft) Assistive device: None Gait Pattern/deviations: Step-through pattern, Decreased stride length, Decreased dorsiflexion - right, Ataxic, Narrow base of support, Staggering right Gait velocity: 0.71 m/s Gait velocity interpretation: 1.31 - 2.62 ft/sec, indicative of limited community ambulator   General Gait Details: mild ataxic gait and at times staggering to R. poor tolerance for balance challenge, especially with turning or stepping around obstacles and at times refused balance challenge.   Stairs Stairs: Yes Stairs assistance: Min guard Stair Management: Two rails, Step to pattern, Forwards Number of Stairs: 3 General stair comments: minG with use of bilateral rails   Wheelchair Mobility    Modified Rankin (Stroke Patients Only) Modified Rankin (Stroke Patients Only) Pre-Morbid Rankin Score: No symptoms Modified Rankin: Moderately severe disability     Balance Overall balance assessment: Needs  assistance  Sitting-balance support: No upper extremity supported, Feet supported Sitting balance-Leahy Scale: Fair Sitting balance - Comments: Able to reach off BOS to try to donn socks, but mild instability noted, up to minA for safety   Standing balance support: Bilateral upper extremity supported, No upper extremity supported, During functional activity Standing balance-Leahy Scale: Poor Standing balance comment: Reliant on external physical assistance to prevent LOB             High level balance activites: Backward walking, Direction changes, Turns, Sudden stops, Head turns   Standardized Balance Assessment Standardized Balance Assessment : Dynamic Gait Index   Dynamic Gait Index Level Surface: Mild Impairment Change in Gait Speed: Moderate Impairment Gait with Horizontal Head Turns: Moderate Impairment Gait with Vertical Head Turns: Moderate Impairment Gait and Pivot Turn: Mild Impairment Step Over Obstacle: Moderate Impairment Step Around Obstacles: Moderate Impairment Steps: Moderate Impairment Total Score: 10      Cognition Arousal/Alertness: Awake/alert Behavior During Therapy: Restless, Agitated, Flat affect Overall Cognitive Status: Impaired/Different from baseline Area of Impairment: Attention, Memory, Following commands, Safety/judgement, Awareness, Problem solving                   Current Attention Level: Selective Memory: Decreased short-term memory Following Commands: Follows one step commands with increased time, Follows one step commands inconsistently, Follows multi-step commands inconsistently Safety/Judgement: Decreased awareness of safety, Decreased awareness of deficits Awareness: Emergent Problem Solving: Slow processing, Difficulty sequencing, Requires verbal cues, Requires tactile cues General Comments: Difficult to fully assess orientation and cognition due to communication deficits. Pt easily distracted, needing redirecting. difficulty  with instructions for DGI, but at times just refusing to complete task so difficult to determine how much she did understand. Hyperfocused on wanting to leave hospital and resume activities at home, no insight to deficits        Exercises      General Comments General comments (skin integrity, edema, etc.): VSS, pt with significant frustration regarding use of chair alarm      Pertinent Vitals/Pain Pain Assessment Pain Assessment: No/denies pain Faces Pain Scale: No hurt Pain Intervention(s): Monitored during session     PT Goals (current goals can now be found in the care plan section) Acute Rehab PT Goals Patient Stated Goal: "to get out of here" PT Goal Formulation: With patient/family Time For Goal Achievement: 04/09/22 Potential to Achieve Goals: Good Progress towards PT goals: Progressing toward goals    Frequency    Min 4X/week      PT Plan Current plan remains appropriate       AM-PAC PT "6 Clicks" Mobility   Outcome Measure  Help needed turning from your back to your side while in a flat bed without using bedrails?: A Little Help needed moving from lying on your back to sitting on the side of a flat bed without using bedrails?: A Little Help needed moving to and from a bed to a chair (including a wheelchair)?: A Little Help needed standing up from a chair using your arms (e.g., wheelchair or bedside chair)?: A Little Help needed to walk in hospital room?: A Lot (mod cues) Help needed climbing 3-5 steps with a railing? : A Lot (mod cues) 6 Click Score: 16    End of Session Equipment Utilized During Treatment: Gait belt Activity Tolerance: Patient tolerated treatment well Patient left: in chair;with call bell/phone within reach;with chair alarm set Nurse Communication: Mobility status PT Visit Diagnosis: Unsteadiness on feet (R26.81);Other abnormalities of gait and mobility (R26.89);Muscle weakness (generalized) (  M62.81);Difficulty in walking, not  elsewhere classified (R26.2);Ataxic gait (R26.0);Other symptoms and signs involving the nervous system (R29.898)     Time: 2992-4268 PT Time Calculation (min) (ACUTE ONLY): 18 min  Charges:  $Gait Training: 8-22 mins                     Vickki Muff, PT, DPT   Acute Rehabilitation Department   Ronnie Derby 03/27/2022, 4:36 PM

## 2022-03-28 DIAGNOSIS — I6521 Occlusion and stenosis of right carotid artery: Secondary | ICD-10-CM | POA: Diagnosis not present

## 2022-03-28 DIAGNOSIS — I480 Paroxysmal atrial fibrillation: Secondary | ICD-10-CM | POA: Diagnosis not present

## 2022-03-28 DIAGNOSIS — I63412 Cerebral infarction due to embolism of left middle cerebral artery: Secondary | ICD-10-CM | POA: Diagnosis not present

## 2022-03-28 DIAGNOSIS — E7800 Pure hypercholesterolemia, unspecified: Secondary | ICD-10-CM | POA: Diagnosis not present

## 2022-03-28 LAB — CBC
HCT: 29.6 % — ABNORMAL LOW (ref 36.0–46.0)
Hemoglobin: 10 g/dL — ABNORMAL LOW (ref 12.0–15.0)
MCH: 29.8 pg (ref 26.0–34.0)
MCHC: 33.8 g/dL (ref 30.0–36.0)
MCV: 88.1 fL (ref 80.0–100.0)
Platelets: 249 10*3/uL (ref 150–400)
RBC: 3.36 MIL/uL — ABNORMAL LOW (ref 3.87–5.11)
RDW: 13.5 % (ref 11.5–15.5)
WBC: 7.6 10*3/uL (ref 4.0–10.5)
nRBC: 0 % (ref 0.0–0.2)

## 2022-03-28 LAB — BASIC METABOLIC PANEL
Anion gap: 7 (ref 5–15)
BUN: 18 mg/dL (ref 8–23)
CO2: 24 mmol/L (ref 22–32)
Calcium: 9 mg/dL (ref 8.9–10.3)
Chloride: 101 mmol/L (ref 98–111)
Creatinine, Ser: 0.97 mg/dL (ref 0.44–1.00)
GFR, Estimated: 57 mL/min — ABNORMAL LOW (ref >60)
Glucose, Bld: 101 mg/dL — ABNORMAL HIGH (ref 70–99)
Potassium: 3.4 mmol/L — ABNORMAL LOW (ref 3.5–5.1)
Sodium: 132 mmol/L — ABNORMAL LOW (ref 135–145)

## 2022-03-28 NOTE — TOC Progression Note (Signed)
Transition of Care [facility]) - Progression Note    Patient Details  Name: Barbara Mcconnell MRN: [id number] Date of Birth: 17-Jul-1935  Transition of Care [facility]) CM/SW [place], [place] Phone Number: 03/28/2022, 1:00 PM  Clinical Narrative:   CSW met with patient and daughter to discuss SNF bed offers. Lengthy discussion with both patient and daughter, answering questions about rehab expectations. Daughter frustrated that there are not a lot of options in network with the patient's insurance, and asking about preferred SNFs even if they are out of town. CSW sent referrals to other SNFs in network with [employer], and left messages for referrals to be reviewed when Admissions has returned to the office tomorrow after the holiday. CSW to follow.    Expected Discharge Plan: [place] Barriers to Discharge: Continued Medical Work up, Ship broker  Expected Discharge Plan and Services Expected Discharge Plan: [place] In-house Referral: Clinical Social Work   Post Acute Care Choice: [place] Living arrangements for the past 2 months: [place] Determinants of Health (SDOH) Interventions    Readmission Risk Interventions    08/04/2021    3:35 PM  Readmission Risk Prevention Plan  Transportation Screening Complete  PCP or Specialist Appt within 5-7 Days Complete  Home Care Screening Complete  Medication Review (RN CM) Complete

## 2022-03-28 NOTE — Progress Notes (Signed)
STROKE TEAM PROGRESS NOTE   SUBJECTIVE (INTERVAL HISTORY) Her daughter is at the bedside.  Patient sitting in chair, still has anomia, neuro stable unchanged.  SW is discussing with daughter for SNF placement.  Daughter would like to tour the facility before make decisions.    OBJECTIVE Temp:  [97.8 F (36.6 C)-98.4 F (36.9 C)] 98 F (36.7 C) (09/04 1247) Pulse Rate:  [63-87] 73 (09/04 1247) Cardiac Rhythm: Normal sinus rhythm (09/04 0706) Resp:  [16-20] 18 (09/04 1247) BP: (113-171)/(43-75) 142/74 (09/04 1247) SpO2:  [93 %-100 %] 93 % (09/04 1247)  Recent Labs  Lab 03/25/22 1057  GLUCAP 100*   Recent Labs  Lab 03/25/22 1059 03/25/22 1105 03/26/22 0137 03/27/22 0406 03/28/22 0354  NA 133* 134* 133* 135 132*  K 3.9 3.9 3.5 3.5 3.4*  CL 101 99 99 98 101  CO2 26  --  26 24 24   GLUCOSE 100* 96 110* 106* 101*  BUN 21 23 11 11 18   CREATININE 0.95 0.90 0.80 0.88 0.97  CALCIUM 9.2  --  8.9 9.4 9.0   Recent Labs  Lab 03/25/22 1059  AST 46*  ALT 35  ALKPHOS 54  BILITOT 1.2  PROT 6.5  ALBUMIN 3.6   Recent Labs  Lab 03/25/22 1059 03/25/22 1105 03/26/22 0137 03/27/22 0406 03/28/22 0354  WBC 5.7  --  8.3 7.7 7.6  NEUTROABS 2.9  --   --   --   --   HGB 10.6* 11.6* 10.1* 9.9* 10.0*  HCT 32.5* 34.0* 31.2* 30.4* 29.6*  MCV 90.0  --  91.0 89.7 88.1  PLT 245  --  243 236 249   No results for input(s): "CKTOTAL", "CKMB", "CKMBINDEX", "TROPONINI" in the last 168 hours. No results for input(s): "LABPROT", "INR" in the last 72 hours.  No results for input(s): "COLORURINE", "LABSPEC", "PHURINE", "GLUCOSEU", "HGBUR", "BILIRUBINUR", "KETONESUR", "PROTEINUR", "UROBILINOGEN", "NITRITE", "LEUKOCYTESUR" in the last 72 hours.  Invalid input(s): "APPERANCEUR"     Component Value Date/Time   CHOL 204 (H) 03/26/2022 0137   TRIG 77 03/26/2022 0137   HDL 86 03/26/2022 0137   CHOLHDL 2.4 03/26/2022 0137   VLDL 15 03/26/2022 0137   LDLCALC 103 (H) 03/26/2022 0137   LDLCALC 95  02/15/2022 1119   Lab Results  Component Value Date   HGBA1C 5.4 03/26/2022      Component Value Date/Time   LABOPIA NONE DETECTED 05/13/2019 1056   COCAINSCRNUR NONE DETECTED 05/13/2019 1056   LABBENZ NONE DETECTED 05/13/2019 1056   AMPHETMU NONE DETECTED 05/13/2019 1056   THCU NONE DETECTED 05/13/2019 1056   LABBARB NONE DETECTED 05/13/2019 1056    Recent Labs  Lab 03/25/22 1104  ETH <10    I have personally reviewed the radiological images below and agree with the radiology interpretations.  ECHOCARDIOGRAM COMPLETE  Result Date: 03/26/2022    ECHOCARDIOGRAM REPORT   Patient Name:   NEREYDA BOWLER Date of Exam: 03/26/2022 Medical Rec #:  [id number]    Height:       60.0 in Accession #:    [id number]   Weight:       128.7 lb Date of Birth:  [birth year]     BSA:          1.548 m Patient Age:    36 years     BP:           125/66 mmHg Patient Gender: F            HR:  69 bpm. Exam Location:  Inpatient Procedure: 2D Echo, Cardiac Doppler and Color Doppler Indications:    Stroke  History:        Patient has prior history of Echocardiogram examinations, most                 recent 09/10/2021. CAD, Aortic Valve Disease, Arrythmias:Atrial                 Fibrillation; Risk Factors:Hypertension, Dyslipidemia and Former                 Smoker.                 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 Procedure Date: 08/03/21.  Sonographer:    Clayton Lefort RDCS (AE) Referring Phys: [id number] [place]  1. Left ventricular ejection fraction, by estimation, is 65 to 70%. The left ventricle has normal function. The left ventricle has no regional wall motion abnormalities. There is moderate left ventricular hypertrophy. Left ventricular diastolic parameters are indeterminate.  2. Right ventricular systolic function is normal. The right ventricular size is normal. Tricuspid regurgitation signal is inadequate for assessing PA  pressure.  3. Left atrial size was moderately dilated.  4. The mitral valve is degenerative. Trivial mitral valve regurgitation. Mild mitral stenosis. The mean mitral valve gradient is 5.0 mmHg with average heart rate of 68 bpm. Severe mitral annular calcification.  5. The abnormal color flow signal seen previously below the prosthetic aortic valve is seen again on this exam, color flow in multiple views suggestive of valvular regurgitation, though it cannot be demonstrated with spectral Doppler, which would support color flow artifact, possibly from stent frame of valve. No evidence of prosthetic valve obstruction or significant regurgitation.     The aortic valve has been repaired/replaced. Aortic valve regurgitation is mild. 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 Procedure Date: 08/03/21. Echo findings are consistent with likely stable structure and function of the aortic valve prosthesis. Aortic valve area, by VTI measures 2.58 cm. Aortic valve mean gradient measures 11.0 mmHg. Aortic valve Vmax measures 1.98 m/s. Aortic valve acceleration time measures 85 msec.  6. The inferior vena cava is normal in size with greater than 50% respiratory variability, suggesting right atrial pressure of 3 mmHg. FINDINGS  Left Ventricle: Left ventricular ejection fraction, by estimation, is 65 to 70%. The left ventricle has normal function. The left ventricle has no regional wall motion abnormalities. The left ventricular internal cavity size was normal in size. There is  moderate left ventricular hypertrophy. Left ventricular diastolic parameters are indeterminate. Right Ventricle: The right ventricular size is normal. No increase in right ventricular wall thickness. Right ventricular systolic function is normal. Tricuspid regurgitation signal is inadequate for assessing PA pressure. Left Atrium: Left atrial size was moderately dilated. Right Atrium: Right atrial size was normal in size.  Pericardium: There is no evidence of pericardial effusion. Mitral Valve: The mitral valve is degenerative in appearance. Severe mitral annular calcification. Trivial mitral valve regurgitation. Mild mitral valve stenosis. MV peak gradient, 13.1 mmHg. The mean mitral valve gradient is 5.0 mmHg with average heart rate of 68 bpm. Tricuspid Valve: The tricuspid valve is normal in structure. Tricuspid valve regurgitation is mild . No evidence of tricuspid stenosis. Aortic Valve: The abnormal color flow signal seen  previously below the prosthetic aortic valve is seen again on this exam, color flow in multiple views suggestive of valvular regurgitation, though it cannot be demonstrated with spectral Doppler, which would support color flow artifact, possibly from stent frame of valve. No evidence of prosthetic valve obstruction or significant regurgitation. The aortic valve has been repaired/replaced. Aortic valve regurgitation is mild. Aortic valve mean gradient measures 11.0 mmHg. Aortic valve peak gradient measures 15.8 mmHg. Aortic valve area, by VTI measures 2.58 cm. 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 Procedure Date: 08/03/21. Echo findings are consistent  with normal structure and function of the aortic valve prosthesis. Pulmonic Valve: The pulmonic valve was normal in structure. Pulmonic valve regurgitation is trivial. No evidence of pulmonic stenosis. Aorta: The aortic root is normal in size and structure. Venous: The inferior vena cava is normal in size with greater than 50% respiratory variability, suggesting right atrial pressure of 3 mmHg. IAS/Shunts: No atrial level shunt detected by color flow Doppler.  LEFT VENTRICLE PLAX 2D LVIDd:         4.30 cm   Diastology LVIDs:         2.20 cm   LV e' medial:    4.46 cm/s LV PW:         1.40 cm   LV E/e' medial:  27.6 LV IVS:        1.30 cm   LV e' lateral:   6.20 cm/s LVOT diam:     2.30 cm   LV E/e' lateral: 19.8  LV SV:         128 LV SV Index:   83 LVOT Area:     4.15 cm  RIGHT VENTRICLE             IVC RV Basal diam:  2.70 cm     IVC diam: 1.40 cm RV S prime:     13.70 cm/s TAPSE (M-mode): 2.7 cm LEFT ATRIUM             Index        RIGHT ATRIUM           Index LA diam:        4.60 cm 2.97 cm/m   RA Area:     17.10 cm LA Vol (A2C):   76.2 ml 49.23 ml/m  RA Volume:   44.40 ml  28.68 ml/m LA Vol (A4C):   76.5 ml 49.42 ml/m LA Biplane Vol: 76.6 ml 49.48 ml/m  AORTIC VALVE AV Area (Vmax):    2.99 cm AV Area (Vmean):   2.47 cm AV Area (VTI):     2.58 cm AV Vmax:           198.46 cm/s AV Vmean:          163.147 cm/s AV VTI:            0.495 m AV Peak Grad:      15.8 mmHg AV Mean Grad:      11.0 mmHg LVOT Vmax:         143.00 cm/s LVOT Vmean:        96.800 cm/s LVOT VTI:          0.308 m LVOT/AV VTI ratio: 0.62  AORTA Ao Root diam: 3.30 cm MITRAL VALVE MV Area (PHT): 2.09 cm     SHUNTS MV Area VTI:   2.05 cm     Systemic VTI:  0.31 m MV Peak grad:  13.1 mmHg    Systemic Diam: 2.30 cm MV Mean grad:  5.0 mmHg MV Vmax:       1.81 m/s MV Vmean:      100.0 cm/s MV Decel Time: 363 msec MV E velocity: 123.00 cm/s MV A velocity: 159.00 cm/s MV E/A ratio:  0.77 Cherlynn Kaiser MD Electronically signed by Cherlynn Kaiser MD Signature Date/Time: 03/26/2022/4:09:16 PM    Final    MR BRAIN WO CONTRAST  Result Date: 03/26/2022 CLINICAL DATA:  87 year old female code stroke presentation. Left MCA M2/M3 branch occlusion status post endovascular reperfusion. EXAM: MRI HEAD WITHOUT CONTRAST TECHNIQUE: Multiplanar, multiecho pulse sequences of the brain and surrounding structures were obtained without intravenous contrast. COMPARISON:  CT head, CTA, CTP yesterday. FINDINGS: Brain: Posterior left sylvian fissure and lateral perirolandic region restricted diffusion corresponding to T-max abnormality on CTP yesterday. This is in an area of about 3.5 cm (series 5, image 76 and series 7, image 44). Some additional cortical restricted  diffusion in the left insula, and multiple tiny additional cortical infarcts scattered in both the anterior left frontal lobe (series 5, image 77) and left MCA/PCA watershed including the superior occipital pole (image 71). There is also a tiny contralateral right superior frontal gyrus pre motor cortical focus of restricted diffusion series 5, image 84. No other contralateral restricted diffusion. No posterior fossa restricted diffusion. T2 and FLAIR hyperintense cytotoxic edema in the affected areas. No acute intracranial hemorrhage identified. No midline shift, mass effect, or evidence of intracranial mass lesion. No ventriculomegaly. Normal for age cerebral volume. Negative pituitary and cervicomedullary junction. Minimal for age scattered nonspecific cerebral white matter T2 and FLAIR hyperintensity. There are several small chronic infarcts in the right cerebellum AICA or SCA territory (series 10, image 7). Vascular: Major intracranial vascular flow voids are preserved. Dominant distal left vertebral artery as seen yesterday. Skull and upper cervical spine: Multilevel degenerative appearing cervical spine spondylolisthesis, disc degeneration. Hyperostosis of the calvarium. Visualized bone marrow signal is within normal limits. Sinuses/Orbits: Some leftward gaze. Stable mild paranasal sinus mucosal thickening and/or fluid. Other: Mastoids are clear. Grossly normal visible internal auditory structures. Negative visible scalp and face. IMPRESSION: 1. Left MCA territory ischemia, most confluent in the area of the T-max abnormality yesterday. Additional tiny scattered cortical infarcts also at the left MCA/PCA watershed, and a solitary lesion in the right superior frontal gyrus. 2. No associated hemorrhage or mass effect. 3. Small chronic right cerebellar infarcts. Electronically Signed   By: Genevie Ann M.D.   On: 03/26/2022 05:01   IR PERCUTANEOUS ART THROMBECTOMY/INFUSION INTRACRANIAL INC DIAG ANGIO  Result Date:  03/25/2022 INDICATION: 87 year old female past medical history significant for anxiety, arthritis, CAD, GERD, heart murmur, hemorrhoids, HLD, HTN, afib on eliquis, TAVR, and takotsubo cardiomyopathy; baseline modified Rankin scale 1. She presented with aphasia; NIHSS 4. Her last known well was 9:30 p.m. on 03/24/2022. Head CT showed no large acute territorial infarct or hemorrhage. CT angiogram of the head and neck showed an occlusion of a left M3 branch with a 12 mL ischemic penumbra on CT perfusion. She was transferred to our service for emergency mechanical thrombectomy. EXAM: ULTRASOUND-GUIDED VASCULAR ACCESS DIAGNOSTIC CEREBRAL ANGIOGRAM MECHANICAL FLAT PANEL HEAD CT COMPARISON:  CT/CT angiogram of the head and neck March 25, 2022. MEDICATIONS: No antibiotics administered. ANESTHESIA/SEDATION: The procedure was performed under general anesthesia. CONTRAST:  75 mL of Omnipaque 300 milligram/mL FLUOROSCOPY: Radiation Exposure Index (as provided by the fluoroscopic device): 8250 mGy Kerma COMPLICATIONS: None immediate. TECHNIQUE: Informed written  consent was obtained from the patient's daughter after a thorough discussion of the procedural risks, benefits and alternatives. All questions were addressed. Maximal Sterile Barrier Technique was utilized including caps, mask, sterile gowns, sterile gloves, sterile drape, hand hygiene and skin antiseptic. A timeout was performed prior to the initiation of the procedure. The right groin was prepped and draped in the usual sterile fashion. Using a micropuncture kit and the modified Seldinger technique, access was gained to the right common femoral artery and an 8 French sheath was placed. Real-time ultrasound guidance was utilized for vascular access including the acquisition of a permanent ultrasound image documenting patency of the accessed vessel. Under fluoroscopy, a Zoom 88 guide catheter was navigated over a 6 [place] VTK catheter and a 0.035" Terumo Glidewire into  the aortic arch. The catheter was placed into the left common carotid artery and then advanced into the left internal carotid artery. The diagnostic catheter was removed. Frontal and lateral angiograms of the head were obtained. FINDINGS: 1. Atherosclerotic changes of the right common femoral artery with normal caliber, adequate for vascular access. 2. There is an occlusion of a left M3/MCA posterior division branch with wedge defects noted in the posterior parietal area. PROCEDURE: Using biplane roadmap, a Zoom 35 aspiration catheter was navigated over an Aristotle 24 microguidewire into the cavernous segment of the left ICA. The aspiration catheter was then advanced to the level of occlusion in the left M3 segment and connected to an aspiration pump. Continuous aspiration was performed for 2 minutes. The guide catheter was connected to a VacLok syringe. The aspiration catheter was subsequently removed under constant aspiration. The guide catheter was aspirated for debris. Left internal carotid artery angiogram with magnified frontal lateral views of the head showed recanalization of the left M3 branch with mild vasospasm. A small wedge defect is noted in the posterior parietal region. Follow-up angiogram showed worsening of vasospasm with filling defect in the proximal aspect of the M3 branch. Using biplane roadmap guidance, a phenom 21 microcatheter and a Aristotle 14 microguidewire into the cavernous segment of the left ICA. The microcatheter was then navigated over the wire into the left M3/MCA branch. Then, 5 mg of verapamil were infused within the M3 branch. Follow-up angiogram showed worsening of the vasospasm with associated filling defect. Using biplane roadmap guidance, a phenom 21 microcatheter and a Aristotle 14 microguidewire into the cavernous segment of the left ICA. The microcatheter was then navigated over the wire into the left M3 segment. Then, a 3 mm solitaire stent retriever was deployed  spanning the M3 segment. The device was allowed to intercalated with the clot for 4 minutes. The thrombectomy device was then removed. Follow-up angiograms with magnified frontal and lateral views of the head showed complete recanalization of that M3 branch with persistent small wedge defect in the posterior parietal region. Arterial opacification via collateral flow is noted in the venous phase at the region of the arterial phase wedge defect, consistent with retrograde arterial filling via collaterals. Multiple magnified angiograms were obtained with different projections. However origin of the branch was not identified. The mild early draining vein noted in the region of the wedge defect, suggesting ongoing ischemia. Flat panel CT of the head was obtained and post processed in a separate workstation with concurrent attending physician supervision. Selected images were sent to PACS. No evidence of hemorrhagic transformation. The catheter was subsequently withdrawn. Right common femoral artery angiogram was obtained in right anterior oblique view. The puncture is at the level  of the common femoral artery. The artery has normal caliber, adequate for closure device. The sheath was exchanged over the wire for an 8 [place] Angio-Seal which was utilized for access closure. Immediate hemostasis was achieved. IMPRESSION: Mechanical thrombectomy performed for treatment of a left M3/MCA posterior division branch occlusion achieving adequate recanalization (TICI 2b). Persistent occlusion of a posterior parietal cortical branch noted. No hemorrhagic complication. PLAN: Transferred to ICU for continued post stroke care. Electronically Signed   By: Pedro Earls M.D.   On: 03/25/2022 17:18   IR US Guide Vasc Access Right  Result Date: 03/25/2022 INDICATION: 87 year old female past medical history significant for anxiety, arthritis, CAD, GERD, heart murmur, hemorrhoids, HLD, HTN, afib on eliquis, TAVR, and  takotsubo cardiomyopathy; baseline modified Rankin scale 1. She presented with aphasia; NIHSS 4. Her last known well was 9:30 p.m. on 03/24/2022. Head CT showed no large acute territorial infarct or hemorrhage. CT angiogram of the head and neck showed an occlusion of a left M3 branch with a 12 mL ischemic penumbra on CT perfusion. She was transferred to our service for emergency mechanical thrombectomy. EXAM: ULTRASOUND-GUIDED VASCULAR ACCESS DIAGNOSTIC CEREBRAL ANGIOGRAM MECHANICAL FLAT PANEL HEAD CT COMPARISON:  CT/CT angiogram of the head and neck March 25, 2022. MEDICATIONS: No antibiotics administered. ANESTHESIA/SEDATION: The procedure was performed under general anesthesia. CONTRAST:  75 mL of Omnipaque 300 milligram/mL FLUOROSCOPY: Radiation Exposure Index (as provided by the fluoroscopic device): 4193 mGy Kerma COMPLICATIONS: None immediate. TECHNIQUE: Informed written consent was obtained from the patient's daughter after a thorough discussion of the procedural risks, benefits and alternatives. All questions were addressed. Maximal Sterile Barrier Technique was utilized including caps, mask, sterile gowns, sterile gloves, sterile drape, hand hygiene and skin antiseptic. A timeout was performed prior to the initiation of the procedure. The right groin was prepped and draped in the usual sterile fashion. Using a micropuncture kit and the modified Seldinger technique, access was gained to the right common femoral artery and an 8 French sheath was placed. Real-time ultrasound guidance was utilized for vascular access including the acquisition of a permanent ultrasound image documenting patency of the accessed vessel. Under fluoroscopy, a Zoom 88 guide catheter was navigated over a 6 [place] VTK catheter and a 0.035" Terumo Glidewire into the aortic arch. The catheter was placed into the left common carotid artery and then advanced into the left internal carotid artery. The diagnostic catheter was removed.  Frontal and lateral angiograms of the head were obtained. FINDINGS: 1. Atherosclerotic changes of the right common femoral artery with normal caliber, adequate for vascular access. 2. There is an occlusion of a left M3/MCA posterior division branch with wedge defects noted in the posterior parietal area. PROCEDURE: Using biplane roadmap, a Zoom 35 aspiration catheter was navigated over an Aristotle 24 microguidewire into the cavernous segment of the left ICA. The aspiration catheter was then advanced to the level of occlusion in the left M3 segment and connected to an aspiration pump. Continuous aspiration was performed for 2 minutes. The guide catheter was connected to a VacLok syringe. The aspiration catheter was subsequently removed under constant aspiration. The guide catheter was aspirated for debris. Left internal carotid artery angiogram with magnified frontal lateral views of the head showed recanalization of the left M3 branch with mild vasospasm. A small wedge defect is noted in the posterior parietal region. Follow-up angiogram showed worsening of vasospasm with filling defect in the proximal aspect of the M3 branch. Using biplane roadmap guidance, a phenom 21  microcatheter and a Aristotle 14 microguidewire into the cavernous segment of the left ICA. The microcatheter was then navigated over the wire into the left M3/MCA branch. Then, 5 mg of verapamil were infused within the M3 branch. Follow-up angiogram showed worsening of the vasospasm with associated filling defect. Using biplane roadmap guidance, a phenom 21 microcatheter and a Aristotle 14 microguidewire into the cavernous segment of the left ICA. The microcatheter was then navigated over the wire into the left M3 segment. Then, a 3 mm solitaire stent retriever was deployed spanning the M3 segment. The device was allowed to intercalated with the clot for 4 minutes. The thrombectomy device was then removed. Follow-up angiograms with magnified  frontal and lateral views of the head showed complete recanalization of that M3 branch with persistent small wedge defect in the posterior parietal region. Arterial opacification via collateral flow is noted in the venous phase at the region of the arterial phase wedge defect, consistent with retrograde arterial filling via collaterals. Multiple magnified angiograms were obtained with different projections. However origin of the branch was not identified. The mild early draining vein noted in the region of the wedge defect, suggesting ongoing ischemia. Flat panel CT of the head was obtained and post processed in a separate workstation with concurrent attending physician supervision. Selected images were sent to PACS. No evidence of hemorrhagic transformation. The catheter was subsequently withdrawn. Right common femoral artery angiogram was obtained in right anterior oblique view. The puncture is at the level of the common femoral artery. The artery has normal caliber, adequate for closure device. The sheath was exchanged over the wire for an 8 [place] Angio-Seal which was utilized for access closure. Immediate hemostasis was achieved. IMPRESSION: Mechanical thrombectomy performed for treatment of a left M3/MCA posterior division branch occlusion achieving adequate recanalization (TICI 2b). Persistent occlusion of a posterior parietal cortical branch noted. No hemorrhagic complication. PLAN: Transferred to ICU for continued post stroke care. Electronically Signed   By: Pedro Earls M.D.   On: 03/25/2022 17:18   IR CT Head Ltd  Result Date: 03/25/2022 INDICATION: 87 year old female past medical history significant for anxiety, arthritis, CAD, GERD, heart murmur, hemorrhoids, HLD, HTN, afib on eliquis, TAVR, and takotsubo cardiomyopathy; baseline modified Rankin scale 1. She presented with aphasia; NIHSS 4. Her last known well was 9:30 p.m. on 03/24/2022. Head CT showed no large acute territorial  infarct or hemorrhage. CT angiogram of the head and neck showed an occlusion of a left M3 branch with a 12 mL ischemic penumbra on CT perfusion. She was transferred to our service for emergency mechanical thrombectomy. EXAM: ULTRASOUND-GUIDED VASCULAR ACCESS DIAGNOSTIC CEREBRAL ANGIOGRAM MECHANICAL FLAT PANEL HEAD CT COMPARISON:  CT/CT angiogram of the head and neck March 25, 2022. MEDICATIONS: No antibiotics administered. ANESTHESIA/SEDATION: The procedure was performed under general anesthesia. CONTRAST:  75 mL of Omnipaque 300 milligram/mL FLUOROSCOPY: Radiation Exposure Index (as provided by the fluoroscopic device): 2706 mGy Kerma COMPLICATIONS: None immediate. TECHNIQUE: Informed written consent was obtained from the patient's daughter after a thorough discussion of the procedural risks, benefits and alternatives. All questions were addressed. Maximal Sterile Barrier Technique was utilized including caps, mask, sterile gowns, sterile gloves, sterile drape, hand hygiene and skin antiseptic. A timeout was performed prior to the initiation of the procedure. The right groin was prepped and draped in the usual sterile fashion. Using a micropuncture kit and the modified Seldinger technique, access was gained to the right common femoral artery and an 8 French sheath was placed.  Real-time ultrasound guidance was utilized for vascular access including the acquisition of a permanent ultrasound image documenting patency of the accessed vessel. Under fluoroscopy, a Zoom 88 guide catheter was navigated over a 6 [place] VTK catheter and a 0.035" Terumo Glidewire into the aortic arch. The catheter was placed into the left common carotid artery and then advanced into the left internal carotid artery. The diagnostic catheter was removed. Frontal and lateral angiograms of the head were obtained. FINDINGS: 1. Atherosclerotic changes of the right common femoral artery with normal caliber, adequate for vascular access. 2. There  is an occlusion of a left M3/MCA posterior division branch with wedge defects noted in the posterior parietal area. PROCEDURE: Using biplane roadmap, a Zoom 35 aspiration catheter was navigated over an Aristotle 24 microguidewire into the cavernous segment of the left ICA. The aspiration catheter was then advanced to the level of occlusion in the left M3 segment and connected to an aspiration pump. Continuous aspiration was performed for 2 minutes. The guide catheter was connected to a VacLok syringe. The aspiration catheter was subsequently removed under constant aspiration. The guide catheter was aspirated for debris. Left internal carotid artery angiogram with magnified frontal lateral views of the head showed recanalization of the left M3 branch with mild vasospasm. A small wedge defect is noted in the posterior parietal region. Follow-up angiogram showed worsening of vasospasm with filling defect in the proximal aspect of the M3 branch. Using biplane roadmap guidance, a phenom 21 microcatheter and a Aristotle 14 microguidewire into the cavernous segment of the left ICA. The microcatheter was then navigated over the wire into the left M3/MCA branch. Then, 5 mg of verapamil were infused within the M3 branch. Follow-up angiogram showed worsening of the vasospasm with associated filling defect. Using biplane roadmap guidance, a phenom 21 microcatheter and a Aristotle 14 microguidewire into the cavernous segment of the left ICA. The microcatheter was then navigated over the wire into the left M3 segment. Then, a 3 mm solitaire stent retriever was deployed spanning the M3 segment. The device was allowed to intercalated with the clot for 4 minutes. The thrombectomy device was then removed. Follow-up angiograms with magnified frontal and lateral views of the head showed complete recanalization of that M3 branch with persistent small wedge defect in the posterior parietal region. Arterial opacification via collateral  flow is noted in the venous phase at the region of the arterial phase wedge defect, consistent with retrograde arterial filling via collaterals. Multiple magnified angiograms were obtained with different projections. However origin of the branch was not identified. The mild early draining vein noted in the region of the wedge defect, suggesting ongoing ischemia. Flat panel CT of the head was obtained and post processed in a separate workstation with concurrent attending physician supervision. Selected images were sent to PACS. No evidence of hemorrhagic transformation. The catheter was subsequently withdrawn. Right common femoral artery angiogram was obtained in right anterior oblique view. The puncture is at the level of the common femoral artery. The artery has normal caliber, adequate for closure device. The sheath was exchanged over the wire for an 8 [place] Angio-Seal which was utilized for access closure. Immediate hemostasis was achieved. IMPRESSION: Mechanical thrombectomy performed for treatment of a left M3/MCA posterior division branch occlusion achieving adequate recanalization (TICI 2b). Persistent occlusion of a posterior parietal cortical branch noted. No hemorrhagic complication. PLAN: Transferred to ICU for continued post stroke care. Electronically Signed   By: Pedro Earls M.D.   On:  03/25/2022 17:18   CT ANGIO HEAD NECK W WO CM W PERF (CODE STROKE)  Result Date: 03/25/2022 CLINICAL DATA:  Provided history: Neuro deficit, acute, stroke suspected. EXAM: CT ANGIOGRAPHY HEAD AND NECK CT PERFUSION BRAIN TECHNIQUE: Multidetector CT imaging of the head and neck was performed using the standard protocol during bolus administration of intravenous contrast. Multiplanar CT image reconstructions and MIPs were obtained to evaluate the vascular anatomy. Carotid stenosis measurements (when applicable) are obtained utilizing NASCET criteria, using the distal internal carotid diameter as the  denominator. Multiphase CT imaging of the brain was performed following IV bolus contrast injection. Subsequent parametric perfusion maps were calculated using RAPID software. RADIATION DOSE REDUCTION: This exam was performed according to the departmental dose-optimization program which includes automated exposure control, adjustment of the mA and/or kV according to patient size and/or use of iterative reconstruction technique. CONTRAST:  113m OMNIPAQUE IOHEXOL 350 MG/ML SOLN COMPARISON:  Noncontrast head CT performed earlier today 03/25/2022. FINDINGS: CTA NECK FINDINGS Aortic arch: Standard aortic branching. Atherosclerotic plaque within the visualized aortic arch and proximal major branch vessels of the neck. No hemodynamically significant innominate or proximal subclavian artery stenosis. Right carotid system: CCA and ICA patent within the neck. Prominent calcified plaque about the carotid bifurcation and within the proximal ICA. Severe stenosis at the origin of the ICA (estimated to be 80-90%). Left carotid system: CCA and ICA patent within the neck without hemodynamically significant stenosis (50% or greater). Atherosclerotic plaque scattered within the CCA, about the carotid bifurcation and within the proximal ICA. Vertebral arteries: The non dominant right vertebral artery is developmentally diminutive, but patent throughout the neck. The dominant left vertebral artery is patent within the neck. Mild atherosclerotic narrowing at the origin of this vessel. Skeleton: Grade 1 anterolisthesis at C2-C3, C3-C4, C4-C5. Grade 1 retrolisthesis at C5-C6. Grade 1 anterolisthesis at C6-C7 and C7-T1. Cervical spondylosis. Multilevel spinal canal narrowing. Most notably at C5-C6, a posterior disc osteophyte complex contributes to apparent moderate spinal canal stenosis. Multilevel bony neural foraminal narrowing. Disc degeneration is advanced at C4-C5, C5-C6 and C6-C7. Other neck: No neck mass or cervical  lymphadenopathy. Upper chest: Bronchiectasis and scattered tree-in-bud opacities within the bilateral upper lobes. The tree-in-bud opacities likely reflect sequelae of chronic infection. Biapical pleuroparenchymal scarring. Review of the MIP images confirms the above findings CTA HEAD FINDINGS Anterior circulation: The intracranial internal carotid arteries are patent. Nonstenotic calcified plaque within both vessels. The M1 middle cerebral arteries are patent. Abrupt occlusion of a proximal to mid M2 left MCA vessel (series 14, image 27) (series 9, image 91). The anterior cerebral arteries are patent. No intracranial aneurysm is identified. Posterior circulation: Diminutive intracranial right vertebral artery with superimposed high-grade atherosclerotic stenosis distally. The dominant intracranial left vertebral artery is patent without stenosis. The basilar artery is patent. The posterior cerebral arteries are patent. Atherosclerotic irregularity of both vessels without high-grade proximal stenosis. The posterior cerebral arteries are fetal in origin, bilaterally. Venous sinuses: Within the limitations of contrast timing, no convincing thrombus. Anatomic variants: As described. Review of the MIP images confirms the above findings CT Brain Perfusion Findings: CBF (<30%) Volume: 070mPerfusion (Tmax>6.0s) volume: 1279mleft MCA vascular territory) Mismatch Volume: 27m42mfarction Location:None identified CTA head impression #1 called by telephone at the time of interpretation on 03/25/2022 at 12:15 pm to provider Dr. KirkLeonel Ramsayo verbally acknowledged these results. IMPRESSION: CTA neck: 1. The common carotid and internal carotid arteries are patent within the neck. Atherosclerotic plaque, bilaterally. Most notably, there is a  severe (estimated 80-90% stenosis) at the origin of the right ICA. 2. Vertebral arteries patent within the neck. Mild atherosclerotic narrowing at the origin of the dominant left vertebral  artery. 3.  Aortic Atherosclerosis (ICD10-I70.0). 4. Bronchiectasis and scattered tree-in-bud pulmonary opacities within the bilateral upper lobes. The tree-in-bud opacities likely reflect sequelae of chronic infection. 5. Cervical spondylosis. CTA head: 1. Occluded proximal-to-mid M2 left MCA vessel. 2. Additional intracranial atherosclerotic disease, as described. Most notably, there is a severe stenosis within the distal V4 right vertebral artery (the non-dominant vertebral artery). CT perfusion head: The perfusion software identifies a 12 mL region of critically hypoperfused parenchyma within the left MCA vascular territory. No core infarct is identified. Reported mismatch volume: 12 mL. Electronically Signed   By: Kellie Simmering D.O.   On: 03/25/2022 12:19   CT HEAD CODE STROKE WO CONTRAST  Result Date: 03/25/2022 CLINICAL DATA:  Code stroke. Neuro deficit, acute, stroke suspected. EXAM: CT HEAD WITHOUT CONTRAST TECHNIQUE: Contiguous axial images were obtained from the base of the skull through the vertex without intravenous contrast. RADIATION DOSE REDUCTION: This exam was performed according to the departmental dose-optimization program which includes automated exposure control, adjustment of the mA and/or kV according to patient size and/or use of iterative reconstruction technique. COMPARISON:  Head CT 12/27/2020. FINDINGS: Brain: Mild generalized parenchymal atrophy. Redemonstrated small chronic infarct within the right cerebellar hemisphere (series 2, image 10). Tiny infarct within the left cerebellar hemisphere, new from the prior head CT of 12/27/2020 but otherwise age-indeterminate (series 5, image 51) (series 6, image 42). There is no acute intracranial hemorrhage. No demarcated cortical infarct. No extra-axial fluid collection. No evidence of an intracranial mass. No midline shift. Vascular: Atherosclerotic calcifications. Calcific focus within the left sylvian fissure, new from prior exams and  concerning for a calcified embolus within an M2 left MCA vessel (series 5, image 31). Skull: No fracture or aggressive osseous lesion. Sinuses/Orbits: No mass or acute finding within the imaged orbits. Mild mucosal thickening within the right maxillary sinus at the imaged levels. Small to moderate-volume secretions within the left maxillary sinus at the imaged levels. Small-volume secretions within the right sphenoid sinus. Minimal mucosal thickening within the anterior ethmoid air cells, bilaterally. ASPECTS [facility] Stroke Program Early CT Score) - Ganglionic level infarction (caudate, lentiform nuclei, internal capsule, insula, M1-M3 cortex): 7 - Supraganglionic infarction (M4-M6 cortex): 3 Total score (0-10 with 10 being normal): 10 Pertinent results were communicated to Dr. Leonie Man At 11:23 amon 9/1/2023by text page via the [facility] messaging system. IMPRESSION: 1. No acute intracranial hemorrhage or acute demarcated cortical infarct. 2. Suspected calcified embolus within a left M2 MCA vessel, as described. Correlate with findings on pending CTA head/neck. 3. Tiny infarct within the left cerebellar hemisphere, new from the prior head CT of 12/27/2020 but otherwise age-indeterminate. 4. 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 5. Mild generalized parenchymal atrophy. 6. Paranasal sinus disease at the imaged levels, as described. Electronically Signed   By: Kellie Simmering D.O.   On: 03/25/2022 11:27     PHYSICAL EXAM  Temp:  [97.8 F (36.6 C)-98.4 F (36.9 C)] 98 F (36.7 C) (09/04 1247) Pulse Rate:  [63-87] 73 (09/04 1247) Resp:  [16-20] 18 (09/04 1247) BP: (113-171)/(43-75) 142/74 (09/04 1247) SpO2:  [93 %-100 %] 93 % (09/04 1247)  General - Well nourished, well developed, in no apparent distress.  Ophthalmologic - fundi not visualized due to noncooperation.  Cardiovascular - Regular rhythm and rate, not in afib  during round.  Neuro - awake, alert, eyes open,  mild to moderate expressive aphasia with word finding difficulty, anomia and able to repeat first half a sentence only.  Also has partial receptive aphasia, not able to follow two step commands. No gaze palsy, tracking bilaterally, visual field full but with wrong number of fingers. No facial droop. Tongue midline. Bilateral UEs 5/5, no drift. Bilaterally LEs 5/5, no drift. Sensation symmetrical bilaterally subjectively, L FTN intact, R FTN ataxic, gait not tested.    ASSESSMENT/PLAN Ms. Ival Basquez is a 87 y.o. 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 No tPA given due to on Eliquis and outside window.    Stroke:  left MCA infarct with left M2 occlusion s/p IR with reocclusion of left M3, embolic secondary to afib even on eliquis CT head showed left M2 hyperdense sign and old right cerebellar infarct CTA head and neck left M2 occlusion, right ICA 80 to 90% stenosis Status post IR with reocclusion of left M3 MRI left MCA moderate sized infarct 2D Echo EF 65 to 70%, likely stable structure and function of the aortic valve prosthesis LDL 103 HgbA1c 5.4 SCDs for VTE prophylaxis Eliquis (apixaban) 2.5 twice daily prior to admission, now on Pradaxa for stroke prevention. Ongoing aggressive stroke risk factor management Therapy recommendations: CIR. however daughter not able to provide 24/7 supervision at home after CIR, will need SNF most likely Disposition: Pending  PAF Now on NSR on tele Eliquis 2.5 twice daily at home, stated compliance Likely failed Eliquis this time Aspirin switch to Pradaxa   Carotid stenosis Right ICA 80 to 90% stenosis on CTA Known chronic right ICA stenosis by PCP per daughter Follow-up as outpatient with PCP  Hypertension Stable BP goal < 180/105 Long term BP goal normotensive  Hyperlipidemia Home meds: Crestor 5 LDL 103,  goal < 70 Now on Crestor 20 Continue statin at discharge  Other Stroke Risk Factors Advanced age Coronary artery disease Status post TAVR 07/2021 Takotsubo cardiomyopathy  Other Active Problems none  Hospital day # 3   Rosalin Hawking, MD PhD Stroke Neurology 03/28/2022 1:31 PM    To contact Stroke Continuity provider, please refer to [email or web address] After hours, contact General Neurology

## 2022-03-28 NOTE — Evaluation (Signed)
Speech Language Pathology Evaluation Patient Details Name: Deanza Upperman MRN: [id number] DOB: 04/03/1935 Today's Date: 03/28/2022 Time: 3295-1884 SLP Time Calculation (min) (ACUTE ONLY): 40 min  Problem List:  Patient Active Problem List   Diagnosis Date Noted   Stroke (HCC) 03/25/2022   Urinary tract bacterial infections 02/15/2022   Anxiety    Physical deconditioning 08/04/2021   PAF (paroxysmal atrial fibrillation) (HCC) 08/03/2021   S/P TAVR (transcatheter aortic valve replacement) 08/03/2021   Hyponatremia 07/30/2021   Fluid overload 07/22/2021   Bronchiectasis with acute exacerbation (HCC) 07/22/2021   Other cirrhosis of liver (HCC) 07/22/2021   Pericardial effusion 07/21/2021   Hypertension    Severe aortic stenosis 05/31/2019   Renal insufficiency 05/31/2019   CAD (coronary artery disease) 05/31/2019   Dyslipidemia, goal LDL below 70 05/13/2019   Carotid arterial disease (HCC) 05/13/2019   Takotsubo cardiomyopathy    Past Medical History:  Past Medical History:  Diagnosis Date   Allergies    Anxiety    panic attacks   Aortic stenosis    Arthritis    CAD (coronary artery disease)    non obst CAD   GERD (gastroesophageal reflux disease)    Heart murmur    Hemorrhoids    History of mammogram    Hyperlipemia    Hypertension    Internal carotid artery stenosis    PAF (paroxysmal atrial fibrillation) (HCC)    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 Lynnette Caffey & Dr. Laneta Simmers   Seasonal allergies    Takotsubo cardiomyopathy    Past Surgical History:  Past Surgical History:  Procedure Laterality Date   COLONOSCOPY     EYE SURGERY Bilateral    cataracts removed   INTRAOPERATIVE TRANSTHORACIC ECHOCARDIOGRAM N/A 08/03/2021   Procedure: INTRAOPERATIVE TRANSTHORACIC ECHOCARDIOGRAM;  Surgeon: Orbie Pyo, MD;  Location: [facility] INVASIVE CV LAB;  Service: Open Heart Surgery;  Laterality:  N/A;   IR CT HEAD LTD  03/25/2022   IR PERCUTANEOUS ART THROMBECTOMY/INFUSION INTRACRANIAL INC DIAG ANGIO  03/25/2022   IR US GUIDE VASC ACCESS RIGHT  03/25/2022   PERICARDIOCENTESIS N/A 07/28/2021   Procedure: PERICARDIOCENTESIS;  Surgeon: Orbie Pyo, MD;  Location[facility] INVASIVE CV LAB;  Service: Cardiovascular;  Laterality: N/A;   RIGHT/LEFT HEART CATH AND CORONARY ANGIOGRAPHY N/A 05/13/2019   Procedure: RIGHT/LEFT HEART CATH AND CORONARY ANGIOGRAPHY;  Surgeon: [place], Peter M, MD;  Location: [facility] INVASIVE CV LAB;  Service: Cardiovascular;  Laterality: N/A;   RIGHT/LEFT HEART CATH AND CORONARY ANGIOGRAPHY N/A 07/23/2021   Procedure: RIGHT/LEFT HEART CATH AND CORONARY ANGIOGRAPHY;  Surgeon: Corky Crafts, MD;  Location: [facility] INVASIVE CV LAB;  Service: Cardiovascular;  Laterality: N/A;   TONSILLECTOMY     TRANSCATHETER AORTIC VALVE REPLACEMENT, TRANSFEMORAL N/A 08/03/2021   Procedure: TRANSCATHETER AORTIC VALVE REPLACEMENT, TRANSFEMORAL;  Surgeon: Orbie Pyo, MD;  Location: [facility] INVASIVE CV LAB;  Service: Open Heart Surgery;  Laterality: N/A;   UPPER GI ENDOSCOPY     HPI:  Pt is an 87 y.o. female who presented 03/25/22 with AMS and aphasia. Outside window for tPA. S/p mechanical thrombectomy 9/1. MRI revealed left MCA territory ischemia, additional tiny scattered cortical infarcts at the left MCA/PCA watershed, solitary lesion in the right superior frontal gyrus, and small chronic right cerebellar infarcts. PMH: anxiety, aortic stenosis, arthritis, s/p TAVR 08/03/21, CAD, heart murmur, HLD, HTN, PAF   Assessment / Plan / Recommendation Clinical Impression  Ms.  Williamsen presents with an expressive > receptive aphasia marked by dysfluency, islands of clear speech, impaired repetition of sentences and complicated words, naming errors notable for phonemic paraphasias.  Her output is more consistent with an anterior aphasia.  Comprehension is mildly impaired, with preserved ability to discriminate  among objects, follow one and two-step commands, and undestand basic conversation. R/L discrimination is ~75% accurate.  Writing is also impacted with notable paragraphic errors. Ms. Lanzo will benefit from intensive speech therapy upon D/C.  Discussed nature of aphasia and prognosis pt and her daughter, [name].    SLP Assessment  SLP Recommendation/Assessment: Patient needs continued Speech Lanaguage Pathology Services SLP Visit Diagnosis: Aphasia (R47.01)    Recommendations for follow up therapy are one component of a multi-disciplinary discharge planning process, led by the attending physician.  Recommendations may be updated based on patient status, additional functional criteria and insurance authorization.    Follow Up Recommendations  Skilled nursing-short term rehab (<3 hours/day)    Assistance Recommended at Discharge  Intermittent Supervision/Assistance  Functional Status Assessment Patient has had a recent decline in their functional status and demonstrates the ability to make significant improvements in function in a reasonable and predictable amount of time.  Frequency and Duration min 1 x/week  1 week      SLP Evaluation Cognition  Overall Cognitive Status: Difficult to assess       Comprehension  Auditory Comprehension Overall Auditory Comprehension: Impaired Yes/No Questions: Within Functional Limits Commands: Impaired One Step Basic Commands: 75-100% accurate Two Step Basic Commands: 50-74% accurate Multistep Basic Commands: 25-49% accurate Conversation: Simple EffectiveTechniques: Extra processing time Visual Recognition/Discrimination Discrimination: Within Function Limits Reading Comprehension Reading Status: Impaired Sentence Level: Impaired    Expression Expression Primary Mode of Expression: Verbal Verbal Expression Overall Verbal Expression: Impaired Initiation: No impairment Automatic Speech:  (difficult) Level of Generative/Spontaneous  Verbalization: Sentence Repetition: Impaired Level of Impairment: Phrase level Naming: Impairment Confrontation: Impaired Verbal Errors: Phonemic paraphasias;Aware of errors Pragmatics: No impairment Written Expression Dominant Hand: Right   Oral / Motor  Oral Motor/Sensory Function Overall Oral Motor/Sensory Function: Within functional limits Motor Speech Overall Motor Speech: Impaired Respiration: Within functional limits Phonation: Normal Resonance: Within functional limits Articulation: Impaired Level of Impairment: Word Intelligibility: Intelligible Motor Planning: Impaired Level of Impairment: Word            Blenda Mounts Laurice 03/28/2022, 1:40 PM Berenise Hunton L. Samson Frederic, MA CCC/SLP Clinical Specialist - Acute Care SLP Acute Rehabilitation Services Office number [phone]

## 2022-03-28 NOTE — Progress Notes (Signed)
Occupational Therapy Treatment Patient Details Name: Barbara Mcconnell MRN: [id number] DOB: 08/10/1934 Today's Date: 03/28/2022   History of present illness Pt is an 87 y.o. female who presented 03/25/22 with AMS and aphasia. Outside window for tPA. S/p mechanical thrombectomy 9/1. MRI revealed left MCA territory ischemia, additional tiny scattered cortical infarcts at the left MCA/PCA watershed, solitary lesion in the right superior frontal gyrus, and small chronic right cerebellar infarcts. 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 Updated discharge recommendation to SNF due to pt family unable to provide as much assist as initially anticipated upon discharge. Pt doffing/donning socks with min guard A. With max confusion, asking therapist if socks she just doffed were therapist's socks. Pt performing grooming at sink with min guard A, and observing decreased coordination of RUE. Performing 2 step path finding with min-mod cues. Pt performing gross grasp, lateral pinch, and 3 point pinch to challenge FM coordination. Pt requiring mod A and cues. Pt continues to present with decreased coordination, balance, and cognition.    Recommendations for follow up therapy are one component of a multi-disciplinary discharge planning process, led by the attending physician.  Recommendations may be updated based on patient status, additional functional criteria and insurance authorization.    Follow Up Recommendations  Skilled nursing-short term rehab (<3 hours/day)    Assistance Recommended at Discharge Frequent or constant Supervision/Assistance  Patient can return home with the following  A little help with walking and/or transfers;Direct supervision/assist for medications management;Direct supervision/assist for financial management;Assist for transportation;Assistance with cooking/housework;Help with stairs  or ramp for entrance;A little help with bathing/dressing/bathroom   Equipment Recommendations  Other (comment) (defer to next venue)    Recommendations for Other Services      Precautions / Restrictions Precautions Precautions: Fall;Other (comment) Precaution Comments: SBP 120-140 Restrictions Weight Bearing Restrictions: No       Mobility Bed Mobility               General bed mobility comments: in chair on arrival and departure.    Transfers Overall transfer level: Needs assistance Equipment used: Rolling walker (2 wheels) Transfers: Sit to/from Stand Sit to Stand: Min guard           General transfer comment: min guard A for safety. Intermittent tactile cues for hand placement or problem solving with use of RW     Balance Overall balance assessment: Needs assistance Sitting-balance support: No upper extremity supported, Feet supported Sitting balance-Leahy Scale: Good Sitting balance - Comments: donning socks sitting in chair   Standing balance support: During functional activity Standing balance-Leahy Scale: Fair Standing balance comment: min guard A for safety.                           ADL either performed or assessed with clinical judgement   ADL Overall ADL's : Needs assistance/impaired     Grooming: Oral care;Minimal assistance;Min guard;Standing;Wash/dry hands Grooming Details (indicate cue type and reason): min gaurd-min A for oral care. Pt with frequent dropping items on floor, requiring therapist to retrieve. Min-mod cues for attention to task/sequencing. Pt with decreased coordination throughout.             Lower Body Dressing: Min guard;Sitting/lateral leans Lower Body Dressing Details (indicate cue type and reason): doff/donning socks with min guard A  Functional mobility during ADLs: Min guard;Rolling walker (2 wheels) General ADL Comments: decreased attention, expressive abilities, attention, problem  solving, memory, and safety awareness.    Extremity/Trunk Assessment Upper Extremity Assessment Upper Extremity Assessment: RUE deficits/detail RUE Deficits / Details: difficulty with coordination RUE Coordination: decreased fine motor   Lower Extremity Assessment Lower Extremity Assessment: Defer to PT evaluation        Vision   Vision Assessment?: Vision impaired- to be further tested in functional context Additional Comments: Significantly increased time with locating all grooming items regardless of orientation of items to body.Particularly difficult on R side this session.   Perception Perception Perception: Impaired   Praxis Praxis Praxis: Impaired Praxis Impairment Details: Ideomotor;Motor planning;Perseveration Praxis-Other Comments: Perseverating on whether someone is coming to see her today, wanting to leave. Max difficulty coordinating fine motor movements despite increased effort to attempt (lateral pinch, 3 point pinch, gross grasp)    Cognition Arousal/Alertness: Awake/alert Behavior During Therapy: WFL for tasks assessed/performed Overall Cognitive Status: Difficult to assess Area of Impairment: Attention, Following commands, Safety/judgement, Problem solving, Orientation                 Orientation Level: Place Current Attention Level: Sustained, Selective Memory: Decreased short-term memory Following Commands: Follows one step commands with increased time, Follows one step commands inconsistently, Follows multi-step commands inconsistently Safety/Judgement: Decreased awareness of safety, Decreased awareness of deficits Awareness: Emergent Problem Solving: Slow processing, Difficulty sequencing, Requires verbal cues, Requires tactile cues General Comments: Pt initially stating "I have already been up today", but agreeable with patient choice involved. Pt motivated to perform oral care. Pt removing socks to don new socks, and the stating "are those your  socks" with gestural cue in direction of socks she had just doffed. Pt with frequent repeating herself and expressive difficulties, but with direct attempts to describe to therapist what is going on with RUE. Pt requiring repeated commands during 2 step path finding; unsure if due to expressive/receptive difficulties or cognition.        Exercises Exercises: Other exercises Other Exercises Other Exercises: Pt performing gross grasp to retrieve items from therapist 10x Other Exercises: Pt performing lateral pinch to retrieve items from therapist with mod A for facilitation of correct movement and mod cues for technique Other Exercises: Pt performing 3 point pinch with max facilitation from therapist 5x. pt reporting she has had difficulty with middle finger at baseline    Shoulder Instructions       General Comments Significant frustration with being in the hospital and not being able to move.    Pertinent Vitals/ Pain       Pain Assessment Pain Assessment: No/denies pain Pain Intervention(s): Monitored during session  Home Living                                          Prior Functioning/Environment              Frequency  Min 2X/week        Progress Toward Goals  OT Goals(current goals can now be found in the care plan section)  Progress towards OT goals: Progressing toward goals  Acute Rehab OT Goals Patient Stated Goal: return to chair, not bed OT Goal Formulation: With patient Time For Goal Achievement: 04/08/22 Potential to Achieve Goals: Good ADL Goals Pt Will Perform Grooming: with supervision;standing Pt Will Perform Upper Body Dressing: with  supervision;sitting Pt Will Perform Lower Body Dressing: with supervision;sit to/from stand Pt Will Transfer to Toilet: with supervision;ambulating;regular height toilet  Plan Discharge plan needs to be updated    Co-evaluation                 AM-PAC OT "6 Clicks" Daily Activity      Outcome Measure   Help from another person eating meals?: A Little Help from another person taking care of personal grooming?: A Little Help from another person toileting, which includes using toliet, bedpan, or urinal?: A Little Help from another person bathing (including washing, rinsing, drying)?: A Little Help from another person to put on and taking off regular upper body clothing?: A Little Help from another person to put on and taking off regular lower body clothing?: A Little 6 Click Score: 18    End of Session Equipment Utilized During Treatment: Rolling walker (2 wheels)  OT Visit Diagnosis: Unsteadiness on feet (R26.81);Ataxia, unspecified (R27.0);Other symptoms and signs involving cognitive function   Activity Tolerance Patient tolerated treatment well   Patient Left in chair;with call bell/phone within reach;with chair alarm set   Nurse Communication Mobility status        Time: 1751-0258 OT Time Calculation (min): 27 min  Charges: OT General Charges $OT Visit: 1 Visit OT Treatments $Self Care/Home Management : 8-22 mins $Therapeutic Activity: 8-22 mins  Barbara Mcconnell, Barbara Mcconnell [facility] Acute Rehabilitation Office: [phone]  Barbara Mcconnell 03/28/2022, 4:05 PM

## 2022-03-28 NOTE — Care Management Important Message (Signed)
Important Message  Patient Details  Name: Barbara Mcconnell MRN: [id number] Date of Birth: Sep 10, 1934   Medicare Important Message Given:  Yes     Dorena Bodo 03/28/2022, 12:13 PM

## 2022-03-28 NOTE — Progress Notes (Signed)
Physical Therapy Treatment Patient Details Name: Barbara Mcconnell MRN: [id number] DOB: [birth year] Today's Date: 03/28/2022   History of Present Illness Pt is an 87 y.o. female who presented 03/25/22 with AMS and aphasia. Outside window for tPA. S/p mechanical thrombectomy 9/1. MRI revealed left MCA territory ischemia, additional tiny scattered cortical infarcts at the left MCA/PCA watershed, solitary lesion in the right superior frontal gyrus, and small chronic right cerebellar infarcts. 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 Session focused on progressive ambulation and higher level balance challenges. Scored 11/24 on DGI indicating pt is still at increased risk for falls. Noted to have multiple LOB during ambulation with pt stopping each time and throwing out UEs to wide BoS and regain balance. Difficulty with stepping over objects, walking backwards, head turns and changes in gait speed needing external support for balance. Pt frustrated with being in the hospital. Difficulty assessing cognition due to language deficits. Seems to have decreased awareness of deficits/safety. Discharge recommendation updated to SNF as daughter not able to provide 24/7 supervision at d/c. Will follow acutely.    Recommendations for follow up therapy are one component of a multi-disciplinary discharge planning process, led by the attending physician.  Recommendations may be updated based on patient status, additional functional criteria and insurance authorization.  Follow Up Recommendations  Skilled nursing-short term rehab (<3 hours/day) Can patient physically be transported by private vehicle: Yes   Assistance Recommended at Discharge Frequent or constant Supervision/Assistance  Patient can return home with the following A little help with walking and/or transfers;A lot of help with  bathing/dressing/bathroom;Assistance with cooking/housework;Direct supervision/assist for medications management;Direct supervision/assist for financial management;Assist for transportation   Equipment Recommendations  Rolling walker (2 wheels)    Recommendations for Other Services       Precautions / Restrictions Precautions Precautions: Fall;Other (comment) Precaution Comments: SBP 120-140 Restrictions Weight Bearing Restrictions: No     Mobility  Bed Mobility               General bed mobility comments: Up in chair upon PT arrival.    Transfers Overall transfer level: Needs assistance Equipment used: None Transfers: Sit to/from Stand Sit to Stand: Min guard           General transfer comment: Min guard for safety. Impulsive to stand. Stood from Orthoptist.    Ambulation/Gait Ambulation/Gait assistance: Min assist Gait Distance (Feet): 110 Feet (x2 bouts) Assistive device: None Gait Pattern/deviations: Step-through pattern, Decreased stride length, Decreased dorsiflexion - right, Ataxic, Narrow base of support, Staggering right Gait velocity: decreased     General Gait Details: mildly ataxic gait and at times staggering to Rt, stopping numerous times due to LOB throwing UEs out to widen BoS, LOB x5 needing assist at times for support. Poor tolerance for balance challenges, especially with stepping over objects, turning or walking backwards.   Stairs Stairs: Yes Stairs assistance: Min guard Stair Management: Two rails, Step to pattern, Forwards Number of Stairs: 5 (x2 bouts) General stair comments: minG with use of bilateral rails   Wheelchair Mobility    Modified Rankin (Stroke Patients Only) Modified Rankin (Stroke Patients Only) Pre-Morbid Rankin Score: No symptoms Modified Rankin: Moderately severe disability     Balance Overall balance assessment: Needs assistance Sitting-balance support: No upper extremity supported, Feet supported Sitting  balance-Leahy Scale: Fair     Standing balance support: During functional activity Standing  balance-Leahy Scale: Fair Standing balance comment: Reliant on external physical assistance to prevent LOB esp with dynamic tasks, no UE support needed for static balance             High level balance activites: Backward walking, Direction changes, Turns, Sudden stops High Level Balance Comments: performed above with deviations in gait and LOB needing hands on assist to prevent fall. Standardized Balance Assessment Standardized Balance Assessment : Dynamic Gait Index   Dynamic Gait Index Level Surface: Mild Impairment Change in Gait Speed: Moderate Impairment Gait with Horizontal Head Turns: Moderate Impairment Gait with Vertical Head Turns: Moderate Impairment Gait and Pivot Turn: Mild Impairment Step Over Obstacle: Moderate Impairment Step Around Obstacles: Mild Impairment Steps: Moderate Impairment Total Score: 11      Cognition Arousal/Alertness: Awake/alert Behavior During Therapy: Restless (irritated) Overall Cognitive Status: Difficult to assess Area of Impairment: Attention, Following commands, Safety/judgement, Problem solving                   Current Attention Level: Sustained, Selective   Following Commands: Follows one step commands with increased time, Follows one step commands inconsistently, Follows multi-step commands inconsistently Safety/Judgement: Decreased awareness of safety, Decreased awareness of deficits   Problem Solving: Slow processing, Difficulty sequencing, Requires verbal cues, Requires tactile cues General Comments: Difficult to fully assess orientation and cognition due to communication deficits. States yes to being in the hospital. "No No" when told it was September. Very distracted and upset that she is still in the hospital, "this is cause I am here and not the stroke, it is not me"  Needs constant redirecting. Hyperfocused on wanting to  leave hospital and resume activities at home, no insight to deficits. "it is not the stroke." Convinced her poor balance is from laying around in the hospital bed/chair.        Exercises Other Exercises Other Exercises: Sit to stand x5 from chair    General Comments General comments (skin integrity, edema, etc.): Significant frustration with being in the hospital and not being able to move.      Pertinent Vitals/Pain Pain Assessment Pain Assessment: Faces Faces Pain Scale: No hurt    Home Living                          Prior Function            PT Goals (current goals can now be found in the care plan section) Progress towards PT goals: Progressing toward goals    Frequency    Min 3X/week      PT Plan Discharge plan needs to be updated    Co-evaluation              AM-PAC PT "6 Clicks" Mobility   Outcome Measure  Help needed turning from your back to your side while in a flat bed without using bedrails?: A Little Help needed moving from lying on your back to sitting on the side of a flat bed without using bedrails?: A Little Help needed moving to and from a bed to a chair (including a wheelchair)?: A Little Help needed standing up from a chair using your arms (e.g., wheelchair or bedside chair)?: A Little Help needed to walk in hospital room?: A Lot Help needed climbing 3-5 steps with a railing? : A Lot 6 Click Score: 16    End of Session Equipment Utilized During Treatment: Gait belt Activity Tolerance: Patient tolerated treatment well Patient left:  in chair;with call bell/phone within reach;with chair alarm set Nurse Communication: Mobility status PT Visit Diagnosis: Unsteadiness on feet (R26.81);Other abnormalities of gait and mobility (R26.89);Muscle weakness (generalized) (M62.81);Difficulty in walking, not elsewhere classified (R26.2);Ataxic gait (R26.0);Other symptoms and signs involving the nervous system (R29.898)     Time:  6387-5643 PT Time Calculation (min) (ACUTE ONLY): 23 min  Charges:  $Gait Training: 8-22 mins $Neuromuscular Re-education: 8-22 mins                     Vale Haven, PT, DPT Acute Rehabilitation Services Secure chat preferred Office [phone]      Blake Divine A Lanier Ensign 03/28/2022, 3:41 PM

## 2022-03-29 ENCOUNTER — Other Ambulatory Visit (HOSPITAL_COMMUNITY): Payer: Self-pay

## 2022-03-29 ENCOUNTER — Encounter (HOSPITAL_COMMUNITY): Payer: Self-pay | Admitting: Radiology

## 2022-03-29 ENCOUNTER — Telehealth (HOSPITAL_COMMUNITY): Payer: Self-pay

## 2022-03-29 DIAGNOSIS — I63412 Cerebral infarction due to embolism of left middle cerebral artery: Secondary | ICD-10-CM | POA: Diagnosis not present

## 2022-03-29 DIAGNOSIS — E7800 Pure hypercholesterolemia, unspecified: Secondary | ICD-10-CM | POA: Diagnosis not present

## 2022-03-29 LAB — BASIC METABOLIC PANEL
Anion gap: 8 (ref 5–15)
BUN: 22 mg/dL (ref 8–23)
CO2: 24 mmol/L (ref 22–32)
Calcium: 9.2 mg/dL (ref 8.9–10.3)
Chloride: 99 mmol/L (ref 98–111)
Creatinine, Ser: 1.25 mg/dL — ABNORMAL HIGH (ref 0.44–1.00)
GFR, Estimated: 42 mL/min — ABNORMAL LOW (ref >60)
Glucose, Bld: 120 mg/dL — ABNORMAL HIGH (ref 70–99)
Potassium: 3.5 mmol/L (ref 3.5–5.1)
Sodium: 131 mmol/L — ABNORMAL LOW (ref 135–145)

## 2022-03-29 LAB — CBC
HCT: 30 % — ABNORMAL LOW (ref 36.0–46.0)
Hemoglobin: 9.9 g/dL — ABNORMAL LOW (ref 12.0–15.0)
MCH: 29.4 pg (ref 26.0–34.0)
MCHC: 33 g/dL (ref 30.0–36.0)
MCV: 89 fL (ref 80.0–100.0)
Platelets: 249 10*3/uL (ref 150–400)
RBC: 3.37 MIL/uL — ABNORMAL LOW (ref 3.87–5.11)
RDW: 13.6 % (ref 11.5–15.5)
WBC: 8.7 10*3/uL (ref 4.0–10.5)
nRBC: 0 % (ref 0.0–0.2)

## 2022-03-29 NOTE — TOC Benefit Eligibility Note (Signed)
Patient Product/process development scientist completed.    The patient is currently admitted and upon discharge could be taking Dabigatran 150mg .  The current 30 day co-pay is $45.29.   The patient is insured through Medicare Part D     [employer], CPhT Pharmacy Patient Advocate Specialist [facility] Patient Advocate Team Direct Number: [phone]  Fax: [phone]

## 2022-03-29 NOTE — TOC Progression Note (Signed)
Transition of Care [facility]) - Progression Note    Patient Details  Name: Barbara Mcconnell MRN: [id number] Date of Birth: Feb 05, 1935  Transition of Care [facility]) CM/SW Contact  Baldemar Lenis, [place] Phone Number: 03/29/2022, 2:47 PM  Clinical Narrative:   CSW spoke with Admissions at [facility], and they are able to offer a bed for the patient. CSW spoke with daughter, [name], who reviewed [facility] and would like to accept bed offer. CSW submitted for insurance authorization request, awaiting approval. CSW to follow.    Expected Discharge Plan: Skilled Nursing Facility Barriers to Discharge: Continued Medical Work up, English as a second language teacher  Expected Discharge Plan and Services Expected Discharge Plan: Skilled Nursing Facility In-house Referral: Clinical Social Work   Post Acute Care Choice: Skilled Nursing Facility Living arrangements for the past 2 months: Single Family Home                                       Social Determinants of Health (SDOH) Interventions    Readmission Risk Interventions    08/04/2021    3:35 PM  Readmission Risk Prevention Plan  Transportation Screening Complete  PCP or Specialist Appt within 5-7 Days Complete  Home Care Screening Complete  Medication Review (RN CM) Complete

## 2022-03-29 NOTE — Telephone Encounter (Signed)
Pharmacy Patient Advocate Encounter  Insurance verification completed.    The patient is insured through [employer] Part D   The patient is currently admitted and ran test claims for the following: Dabigatran.  Copays and coinsurance results were relayed to Inpatient clinical team.

## 2022-03-29 NOTE — Progress Notes (Addendum)
STROKE TEAM PROGRESS NOTE   SUBJECTIVE (INTERVAL HISTORY) There is no family member @ the bedside Patient sitting in chair, watching TV and still has anomia, neuro stable unchanged.Awaiting for  SNF placement. Patient is more verbal with the staff   OBJECTIVE Temp:  [97.6 F (36.4 C)-98.7 F (37.1 C)] 97.6 F (36.4 C) (09/05 1239) Pulse Rate:  [67-85] 67 (09/05 1239) Cardiac Rhythm: Normal sinus rhythm (09/05 0704) Resp:  [18-20] 20 (09/05 1239) BP: (108-153)/(47-70) 130/47 (09/05 1239) SpO2:  [93 %-99 %] 97 % (09/05 1239)  Recent Labs  Lab 03/25/22 1057  GLUCAP 100*   Recent Labs  Lab 03/25/22 1059 03/25/22 1105 03/26/22 0137 03/27/22 0406 03/28/22 0354 03/29/22 0421  NA 133* 134* 133* 135 132* 131*  K 3.9 3.9 3.5 3.5 3.4* 3.5  CL 101 99 99 98 101 99  CO2 26  --  26 24 24 24   GLUCOSE 100* 96 110* 106* 101* 120*  BUN 21 23 11 11 18 22   CREATININE 0.95 0.90 0.80 0.88 0.97 1.25*  CALCIUM 9.2  --  8.9 9.4 9.0 9.2   Recent Labs  Lab 03/25/22 1059  AST 46*  ALT 35  ALKPHOS 54  BILITOT 1.2  PROT 6.5  ALBUMIN 3.6   Recent Labs  Lab 03/25/22 1059 03/25/22 1105 03/26/22 0137 03/27/22 0406 03/28/22 0354 03/29/22 0421  WBC 5.7  --  8.3 7.7 7.6 8.7  NEUTROABS 2.9  --   --   --   --   --   HGB 10.6* 11.6* 10.1* 9.9* 10.0* 9.9*  HCT 32.5* 34.0* 31.2* 30.4* 29.6* 30.0*  MCV 90.0  --  91.0 89.7 88.1 89.0  PLT 245  --  243 236 249 249   No results for input(s): "CKTOTAL", "CKMB", "CKMBINDEX", "TROPONINI" in the last 168 hours. No results for input(s): "LABPROT", "INR" in the last 72 hours.  No results for input(s): "COLORURINE", "LABSPEC", "PHURINE", "GLUCOSEU", "HGBUR", "BILIRUBINUR", "KETONESUR", "PROTEINUR", "UROBILINOGEN", "NITRITE", "LEUKOCYTESUR" in the last 72 hours.  Invalid input(s): "APPERANCEUR"     Component Value Date/Time   CHOL 204 (H) 03/26/2022 0137   TRIG 77 03/26/2022 0137   HDL 86 03/26/2022 0137   CHOLHDL 2.4 03/26/2022 0137   VLDL 15  03/26/2022 0137   LDLCALC 103 (H) 03/26/2022 0137   LDLCALC 95 02/15/2022 1119   Lab Results  Component Value Date   HGBA1C 5.4 03/26/2022      Component Value Date/Time   LABOPIA NONE DETECTED 05/13/2019 1056   COCAINSCRNUR NONE DETECTED 05/13/2019 1056   LABBENZ NONE DETECTED 05/13/2019 1056   AMPHETMU NONE DETECTED 05/13/2019 1056   THCU NONE DETECTED 05/13/2019 1056   LABBARB NONE DETECTED 05/13/2019 1056    Recent Labs  Lab 03/25/22 1104  ETH <10    I have personally reviewed the radiological images below and agree with the radiology interpretations.  ECHOCARDIOGRAM COMPLETE  Result Date: 03/26/2022    ECHOCARDIOGRAM REPORT   Patient Name:   JAMILETH PUTZIER Date of Exam: 03/26/2022 Medical Rec #:  [id number]    Height:       60.0 in Accession #:    [id number]   Weight:       128.7 lb Date of Birth:  [birth year]     BSA:          1.548 m Patient Age:    87 years     BP:           125/66 mmHg Patient Gender: F  HR:           69 bpm. Exam Location:  Inpatient Procedure: 2D Echo, Cardiac Doppler and Color Doppler Indications:    Stroke  History:        Patient has prior history of Echocardiogram examinations, most                 recent 09/10/2021. CAD, Aortic Valve Disease, Arrythmias:Atrial                 Fibrillation; Risk Factors:Hypertension, Dyslipidemia and Former                 Smoker.                 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 Procedure Date: 08/03/21.  Sonographer:    Clayton Lefort RDCS (AE) Referring Phys: [id number] [place]  1. Left ventricular ejection fraction, by estimation, is 65 to 70%. The left ventricle has normal function. The left ventricle has no regional wall motion abnormalities. There is moderate left ventricular hypertrophy. Left ventricular diastolic parameters are indeterminate.  2. Right ventricular systolic function is normal. The right ventricular size is normal.  Tricuspid regurgitation signal is inadequate for assessing PA pressure.  3. Left atrial size was moderately dilated.  4. The mitral valve is degenerative. Trivial mitral valve regurgitation. Mild mitral stenosis. The mean mitral valve gradient is 5.0 mmHg with average heart rate of 68 bpm. Severe mitral annular calcification.  5. The abnormal color flow signal seen previously below the prosthetic aortic valve is seen again on this exam, color flow in multiple views suggestive of valvular regurgitation, though it cannot be demonstrated with spectral Doppler, which would support color flow artifact, possibly from stent frame of valve. No evidence of prosthetic valve obstruction or significant regurgitation.     The aortic valve has been repaired/replaced. Aortic valve regurgitation is mild. 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 Procedure Date: 08/03/21. Echo findings are consistent with likely stable structure and function of the aortic valve prosthesis. Aortic valve area, by VTI measures 2.58 cm. Aortic valve mean gradient measures 11.0 mmHg. Aortic valve Vmax measures 1.98 m/s. Aortic valve acceleration time measures 85 msec.  6. The inferior vena cava is normal in size with greater than 50% respiratory variability, suggesting right atrial pressure of 3 mmHg. FINDINGS  Left Ventricle: Left ventricular ejection fraction, by estimation, is 65 to 70%. The left ventricle has normal function. The left ventricle has no regional wall motion abnormalities. The left ventricular internal cavity size was normal in size. There is  moderate left ventricular hypertrophy. Left ventricular diastolic parameters are indeterminate. Right Ventricle: The right ventricular size is normal. No increase in right ventricular wall thickness. Right ventricular systolic function is normal. Tricuspid regurgitation signal is inadequate for assessing PA pressure. Left Atrium: Left atrial size was  moderately dilated. Right Atrium: Right atrial size was normal in size. Pericardium: There is no evidence of pericardial effusion. Mitral Valve: The mitral valve is degenerative in appearance. Severe mitral annular calcification. Trivial mitral valve regurgitation. Mild mitral valve stenosis. MV peak gradient, 13.1 mmHg. The mean mitral valve gradient is 5.0 mmHg with average heart rate of 68 bpm. Tricuspid Valve: The tricuspid valve is normal in structure. Tricuspid valve regurgitation is mild . No evidence  of tricuspid stenosis. Aortic Valve: The abnormal color flow signal seen previously below the prosthetic aortic valve is seen again on this exam, color flow in multiple views suggestive of valvular regurgitation, though it cannot be demonstrated with spectral Doppler, which would support color flow artifact, possibly from stent frame of valve. No evidence of prosthetic valve obstruction or significant regurgitation. The aortic valve has been repaired/replaced. Aortic valve regurgitation is mild. Aortic valve mean gradient measures 11.0 mmHg. Aortic valve peak gradient measures 15.8 mmHg. Aortic valve area, by VTI measures 2.58 cm. 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 Procedure Date: 08/03/21. Echo findings are consistent  with normal structure and function of the aortic valve prosthesis. Pulmonic Valve: The pulmonic valve was normal in structure. Pulmonic valve regurgitation is trivial. No evidence of pulmonic stenosis. Aorta: The aortic root is normal in size and structure. Venous: The inferior vena cava is normal in size with greater than 50% respiratory variability, suggesting right atrial pressure of 3 mmHg. IAS/Shunts: No atrial level shunt detected by color flow Doppler.  LEFT VENTRICLE PLAX 2D LVIDd:         4.30 cm   Diastology LVIDs:         2.20 cm   LV e' medial:    4.46 cm/s LV PW:         1.40 cm   LV E/e' medial:  27.6 LV IVS:        1.30 cm   LV  e' lateral:   6.20 cm/s LVOT diam:     2.30 cm   LV E/e' lateral: 19.8 LV SV:         128 LV SV Index:   83 LVOT Area:     4.15 cm  RIGHT VENTRICLE             IVC RV Basal diam:  2.70 cm     IVC diam: 1.40 cm RV S prime:     13.70 cm/s TAPSE (M-mode): 2.7 cm LEFT ATRIUM             Index        RIGHT ATRIUM           Index LA diam:        4.60 cm 2.97 cm/m   RA Area:     17.10 cm LA Vol (A2C):   76.2 ml 49.23 ml/m  RA Volume:   44.40 ml  28.68 ml/m LA Vol (A4C):   76.5 ml 49.42 ml/m LA Biplane Vol: 76.6 ml 49.48 ml/m  AORTIC VALVE AV Area (Vmax):    2.99 cm AV Area (Vmean):   2.47 cm AV Area (VTI):     2.58 cm AV Vmax:           198.46 cm/s AV Vmean:          163.147 cm/s AV VTI:            0.495 m AV Peak Grad:      15.8 mmHg AV Mean Grad:      11.0 mmHg LVOT Vmax:         143.00 cm/s LVOT Vmean:        96.800 cm/s LVOT VTI:          0.308 m LVOT/AV VTI ratio: 0.62  AORTA Ao Root diam: 3.30 cm MITRAL VALVE MV Area (PHT): 2.09 cm     SHUNTS MV Area VTI:   2.05 cm  Systemic VTI:  0.31 m MV Peak grad:  13.1 mmHg    Systemic Diam: 2.30 cm MV Mean grad:  5.0 mmHg MV Vmax:       1.81 m/s MV Vmean:      100.0 cm/s MV Decel Time: 363 msec MV E velocity: 123.00 cm/s MV A velocity: 159.00 cm/s MV E/A ratio:  0.77 Cherlynn Kaiser MD Electronically signed by Cherlynn Kaiser MD Signature Date/Time: 03/26/2022/4:09:16 PM    Final    MR BRAIN WO CONTRAST  Result Date: 03/26/2022 CLINICAL DATA:  87 year old female code stroke presentation. Left MCA M2/M3 branch occlusion status post endovascular reperfusion. EXAM: MRI HEAD WITHOUT CONTRAST TECHNIQUE: Multiplanar, multiecho pulse sequences of the brain and surrounding structures were obtained without intravenous contrast. COMPARISON:  CT head, CTA, CTP yesterday. FINDINGS: Brain: Posterior left sylvian fissure and lateral perirolandic region restricted diffusion corresponding to T-max abnormality on CTP yesterday. This is in an area of about 3.5 cm (series 5, image  76 and series 7, image 44). Some additional cortical restricted diffusion in the left insula, and multiple tiny additional cortical infarcts scattered in both the anterior left frontal lobe (series 5, image 77) and left MCA/PCA watershed including the superior occipital pole (image 71). There is also a tiny contralateral right superior frontal gyrus pre motor cortical focus of restricted diffusion series 5, image 84. No other contralateral restricted diffusion. No posterior fossa restricted diffusion. T2 and FLAIR hyperintense cytotoxic edema in the affected areas. No acute intracranial hemorrhage identified. No midline shift, mass effect, or evidence of intracranial mass lesion. No ventriculomegaly. Normal for age cerebral volume. Negative pituitary and cervicomedullary junction. Minimal for age scattered nonspecific cerebral white matter T2 and FLAIR hyperintensity. There are several small chronic infarcts in the right cerebellum AICA or SCA territory (series 10, image 7). Vascular: Major intracranial vascular flow voids are preserved. Dominant distal left vertebral artery as seen yesterday. Skull and upper cervical spine: Multilevel degenerative appearing cervical spine spondylolisthesis, disc degeneration. Hyperostosis of the calvarium. Visualized bone marrow signal is within normal limits. Sinuses/Orbits: Some leftward gaze. Stable mild paranasal sinus mucosal thickening and/or fluid. Other: Mastoids are clear. Grossly normal visible internal auditory structures. Negative visible scalp and face. IMPRESSION: 1. Left MCA territory ischemia, most confluent in the area of the T-max abnormality yesterday. Additional tiny scattered cortical infarcts also at the left MCA/PCA watershed, and a solitary lesion in the right superior frontal gyrus. 2. No associated hemorrhage or mass effect. 3. Small chronic right cerebellar infarcts. Electronically Signed   By: Genevie Ann M.D.   On: 03/26/2022 05:01   IR PERCUTANEOUS ART  THROMBECTOMY/INFUSION INTRACRANIAL INC DIAG ANGIO  Result Date: 03/25/2022 INDICATION: 87 year old female past medical history significant for anxiety, arthritis, CAD, GERD, heart murmur, hemorrhoids, HLD, HTN, afib on eliquis, TAVR, and takotsubo cardiomyopathy; baseline modified Rankin scale 1. She presented with aphasia; NIHSS 4. Her last known well was 9:30 p.m. on 03/24/2022. Head CT showed no large acute territorial infarct or hemorrhage. CT angiogram of the head and neck showed an occlusion of a left M3 branch with a 12 mL ischemic penumbra on CT perfusion. She was transferred to our service for emergency mechanical thrombectomy. EXAM: ULTRASOUND-GUIDED VASCULAR ACCESS DIAGNOSTIC CEREBRAL ANGIOGRAM MECHANICAL FLAT PANEL HEAD CT COMPARISON:  CT/CT angiogram of the head and neck March 25, 2022. MEDICATIONS: No antibiotics administered. ANESTHESIA/SEDATION: The procedure was performed under general anesthesia. CONTRAST:  75 mL of Omnipaque 300 milligram/mL FLUOROSCOPY: Radiation Exposure Index (as provided by the fluoroscopic device):  4132 mGy Kerma COMPLICATIONS: None immediate. TECHNIQUE: Informed written consent was obtained from the patient's daughter after a thorough discussion of the procedural risks, benefits and alternatives. All questions were addressed. Maximal Sterile Barrier Technique was utilized including caps, mask, sterile gowns, sterile gloves, sterile drape, hand hygiene and skin antiseptic. A timeout was performed prior to the initiation of the procedure. The right groin was prepped and draped in the usual sterile fashion. Using a micropuncture kit and the modified Seldinger technique, access was gained to the right common femoral artery and an 8 French sheath was placed. Real-time ultrasound guidance was utilized for vascular access including the acquisition of a permanent ultrasound image documenting patency of the accessed vessel. Under fluoroscopy, a Zoom 88 guide catheter was  navigated over a 6 [place] VTK catheter and a 0.035" Terumo Glidewire into the aortic arch. The catheter was placed into the left common carotid artery and then advanced into the left internal carotid artery. The diagnostic catheter was removed. Frontal and lateral angiograms of the head were obtained. FINDINGS: 1. Atherosclerotic changes of the right common femoral artery with normal caliber, adequate for vascular access. 2. There is an occlusion of a left M3/MCA posterior division branch with wedge defects noted in the posterior parietal area. PROCEDURE: Using biplane roadmap, a Zoom 35 aspiration catheter was navigated over an Aristotle 24 microguidewire into the cavernous segment of the left ICA. The aspiration catheter was then advanced to the level of occlusion in the left M3 segment and connected to an aspiration pump. Continuous aspiration was performed for 2 minutes. The guide catheter was connected to a VacLok syringe. The aspiration catheter was subsequently removed under constant aspiration. The guide catheter was aspirated for debris. Left internal carotid artery angiogram with magnified frontal lateral views of the head showed recanalization of the left M3 branch with mild vasospasm. A small wedge defect is noted in the posterior parietal region. Follow-up angiogram showed worsening of vasospasm with filling defect in the proximal aspect of the M3 branch. Using biplane roadmap guidance, a phenom 21 microcatheter and a Aristotle 14 microguidewire into the cavernous segment of the left ICA. The microcatheter was then navigated over the wire into the left M3/MCA branch. Then, 5 mg of verapamil were infused within the M3 branch. Follow-up angiogram showed worsening of the vasospasm with associated filling defect. Using biplane roadmap guidance, a phenom 21 microcatheter and a Aristotle 14 microguidewire into the cavernous segment of the left ICA. The microcatheter was then navigated over the wire into the  left M3 segment. Then, a 3 mm solitaire stent retriever was deployed spanning the M3 segment. The device was allowed to intercalated with the clot for 4 minutes. The thrombectomy device was then removed. Follow-up angiograms with magnified frontal and lateral views of the head showed complete recanalization of that M3 branch with persistent small wedge defect in the posterior parietal region. Arterial opacification via collateral flow is noted in the venous phase at the region of the arterial phase wedge defect, consistent with retrograde arterial filling via collaterals. Multiple magnified angiograms were obtained with different projections. However origin of the branch was not identified. The mild early draining vein noted in the region of the wedge defect, suggesting ongoing ischemia. Flat panel CT of the head was obtained and post processed in a separate workstation with concurrent attending physician supervision. Selected images were sent to PACS. No evidence of hemorrhagic transformation. The catheter was subsequently withdrawn. Right common femoral artery angiogram was obtained in right  anterior oblique view. The puncture is at the level of the common femoral artery. The artery has normal caliber, adequate for closure device. The sheath was exchanged over the wire for an 8 [place] Angio-Seal which was utilized for access closure. Immediate hemostasis was achieved. IMPRESSION: Mechanical thrombectomy performed for treatment of a left M3/MCA posterior division branch occlusion achieving adequate recanalization (TICI 2b). Persistent occlusion of a posterior parietal cortical branch noted. No hemorrhagic complication. PLAN: Transferred to ICU for continued post stroke care. Electronically Signed   By: Pedro Earls M.D.   On: 03/25/2022 17:18   IR US Guide Vasc Access Right  Result Date: 03/25/2022 INDICATION: 87 year old female past medical history significant for anxiety, arthritis, CAD, GERD,  heart murmur, hemorrhoids, HLD, HTN, afib on eliquis, TAVR, and takotsubo cardiomyopathy; baseline modified Rankin scale 1. She presented with aphasia; NIHSS 4. Her last known well was 9:30 p.m. on 03/24/2022. Head CT showed no large acute territorial infarct or hemorrhage. CT angiogram of the head and neck showed an occlusion of a left M3 branch with a 12 mL ischemic penumbra on CT perfusion. She was transferred to our service for emergency mechanical thrombectomy. EXAM: ULTRASOUND-GUIDED VASCULAR ACCESS DIAGNOSTIC CEREBRAL ANGIOGRAM MECHANICAL FLAT PANEL HEAD CT COMPARISON:  CT/CT angiogram of the head and neck March 25, 2022. MEDICATIONS: No antibiotics administered. ANESTHESIA/SEDATION: The procedure was performed under general anesthesia. CONTRAST:  75 mL of Omnipaque 300 milligram/mL FLUOROSCOPY: Radiation Exposure Index (as provided by the fluoroscopic device): 3545 mGy Kerma COMPLICATIONS: None immediate. TECHNIQUE: Informed written consent was obtained from the patient's daughter after a thorough discussion of the procedural risks, benefits and alternatives. All questions were addressed. Maximal Sterile Barrier Technique was utilized including caps, mask, sterile gowns, sterile gloves, sterile drape, hand hygiene and skin antiseptic. A timeout was performed prior to the initiation of the procedure. The right groin was prepped and draped in the usual sterile fashion. Using a micropuncture kit and the modified Seldinger technique, access was gained to the right common femoral artery and an 8 French sheath was placed. Real-time ultrasound guidance was utilized for vascular access including the acquisition of a permanent ultrasound image documenting patency of the accessed vessel. Under fluoroscopy, a Zoom 88 guide catheter was navigated over a 6 [place] VTK catheter and a 0.035" Terumo Glidewire into the aortic arch. The catheter was placed into the left common carotid artery and then advanced into the left  internal carotid artery. The diagnostic catheter was removed. Frontal and lateral angiograms of the head were obtained. FINDINGS: 1. Atherosclerotic changes of the right common femoral artery with normal caliber, adequate for vascular access. 2. There is an occlusion of a left M3/MCA posterior division branch with wedge defects noted in the posterior parietal area. PROCEDURE: Using biplane roadmap, a Zoom 35 aspiration catheter was navigated over an Aristotle 24 microguidewire into the cavernous segment of the left ICA. The aspiration catheter was then advanced to the level of occlusion in the left M3 segment and connected to an aspiration pump. Continuous aspiration was performed for 2 minutes. The guide catheter was connected to a VacLok syringe. The aspiration catheter was subsequently removed under constant aspiration. The guide catheter was aspirated for debris. Left internal carotid artery angiogram with magnified frontal lateral views of the head showed recanalization of the left M3 branch with mild vasospasm. A small wedge defect is noted in the posterior parietal region. Follow-up angiogram showed worsening of vasospasm with filling defect in the proximal aspect of the  M3 branch. Using biplane roadmap guidance, a phenom 21 microcatheter and a Aristotle 14 microguidewire into the cavernous segment of the left ICA. The microcatheter was then navigated over the wire into the left M3/MCA branch. Then, 5 mg of verapamil were infused within the M3 branch. Follow-up angiogram showed worsening of the vasospasm with associated filling defect. Using biplane roadmap guidance, a phenom 21 microcatheter and a Aristotle 14 microguidewire into the cavernous segment of the left ICA. The microcatheter was then navigated over the wire into the left M3 segment. Then, a 3 mm solitaire stent retriever was deployed spanning the M3 segment. The device was allowed to intercalated with the clot for 4 minutes. The thrombectomy  device was then removed. Follow-up angiograms with magnified frontal and lateral views of the head showed complete recanalization of that M3 branch with persistent small wedge defect in the posterior parietal region. Arterial opacification via collateral flow is noted in the venous phase at the region of the arterial phase wedge defect, consistent with retrograde arterial filling via collaterals. Multiple magnified angiograms were obtained with different projections. However origin of the branch was not identified. The mild early draining vein noted in the region of the wedge defect, suggesting ongoing ischemia. Flat panel CT of the head was obtained and post processed in a separate workstation with concurrent attending physician supervision. Selected images were sent to PACS. No evidence of hemorrhagic transformation. The catheter was subsequently withdrawn. Right common femoral artery angiogram was obtained in right anterior oblique view. The puncture is at the level of the common femoral artery. The artery has normal caliber, adequate for closure device. The sheath was exchanged over the wire for an 8 [place] Angio-Seal which was utilized for access closure. Immediate hemostasis was achieved. IMPRESSION: Mechanical thrombectomy performed for treatment of a left M3/MCA posterior division branch occlusion achieving adequate recanalization (TICI 2b). Persistent occlusion of a posterior parietal cortical branch noted. No hemorrhagic complication. PLAN: Transferred to ICU for continued post stroke care. Electronically Signed   By: Pedro Earls M.D.   On: 03/25/2022 17:18   IR CT Head Ltd  Result Date: 03/25/2022 INDICATION: 87 year old female past medical history significant for anxiety, arthritis, CAD, GERD, heart murmur, hemorrhoids, HLD, HTN, afib on eliquis, TAVR, and takotsubo cardiomyopathy; baseline modified Rankin scale 1. She presented with aphasia; NIHSS 4. Her last known well was 9:30  p.m. on 03/24/2022. Head CT showed no large acute territorial infarct or hemorrhage. CT angiogram of the head and neck showed an occlusion of a left M3 branch with a 12 mL ischemic penumbra on CT perfusion. She was transferred to our service for emergency mechanical thrombectomy. EXAM: ULTRASOUND-GUIDED VASCULAR ACCESS DIAGNOSTIC CEREBRAL ANGIOGRAM MECHANICAL FLAT PANEL HEAD CT COMPARISON:  CT/CT angiogram of the head and neck March 25, 2022. MEDICATIONS: No antibiotics administered. ANESTHESIA/SEDATION: The procedure was performed under general anesthesia. CONTRAST:  75 mL of Omnipaque 300 milligram/mL FLUOROSCOPY: Radiation Exposure Index (as provided by the fluoroscopic device): 2119 mGy Kerma COMPLICATIONS: None immediate. TECHNIQUE: Informed written consent was obtained from the patient's daughter after a thorough discussion of the procedural risks, benefits and alternatives. All questions were addressed. Maximal Sterile Barrier Technique was utilized including caps, mask, sterile gowns, sterile gloves, sterile drape, hand hygiene and skin antiseptic. A timeout was performed prior to the initiation of the procedure. The right groin was prepped and draped in the usual sterile fashion. Using a micropuncture kit and the modified Seldinger technique, access was gained to the right common  femoral artery and an 8 French sheath was placed. Real-time ultrasound guidance was utilized for vascular access including the acquisition of a permanent ultrasound image documenting patency of the accessed vessel. Under fluoroscopy, a Zoom 88 guide catheter was navigated over a 6 [place] VTK catheter and a 0.035" Terumo Glidewire into the aortic arch. The catheter was placed into the left common carotid artery and then advanced into the left internal carotid artery. The diagnostic catheter was removed. Frontal and lateral angiograms of the head were obtained. FINDINGS: 1. Atherosclerotic changes of the right common femoral  artery with normal caliber, adequate for vascular access. 2. There is an occlusion of a left M3/MCA posterior division branch with wedge defects noted in the posterior parietal area. PROCEDURE: Using biplane roadmap, a Zoom 35 aspiration catheter was navigated over an Aristotle 24 microguidewire into the cavernous segment of the left ICA. The aspiration catheter was then advanced to the level of occlusion in the left M3 segment and connected to an aspiration pump. Continuous aspiration was performed for 2 minutes. The guide catheter was connected to a VacLok syringe. The aspiration catheter was subsequently removed under constant aspiration. The guide catheter was aspirated for debris. Left internal carotid artery angiogram with magnified frontal lateral views of the head showed recanalization of the left M3 branch with mild vasospasm. A small wedge defect is noted in the posterior parietal region. Follow-up angiogram showed worsening of vasospasm with filling defect in the proximal aspect of the M3 branch. Using biplane roadmap guidance, a phenom 21 microcatheter and a Aristotle 14 microguidewire into the cavernous segment of the left ICA. The microcatheter was then navigated over the wire into the left M3/MCA branch. Then, 5 mg of verapamil were infused within the M3 branch. Follow-up angiogram showed worsening of the vasospasm with associated filling defect. Using biplane roadmap guidance, a phenom 21 microcatheter and a Aristotle 14 microguidewire into the cavernous segment of the left ICA. The microcatheter was then navigated over the wire into the left M3 segment. Then, a 3 mm solitaire stent retriever was deployed spanning the M3 segment. The device was allowed to intercalated with the clot for 4 minutes. The thrombectomy device was then removed. Follow-up angiograms with magnified frontal and lateral views of the head showed complete recanalization of that M3 branch with persistent small wedge defect in the  posterior parietal region. Arterial opacification via collateral flow is noted in the venous phase at the region of the arterial phase wedge defect, consistent with retrograde arterial filling via collaterals. Multiple magnified angiograms were obtained with different projections. However origin of the branch was not identified. The mild early draining vein noted in the region of the wedge defect, suggesting ongoing ischemia. Flat panel CT of the head was obtained and post processed in a separate workstation with concurrent attending physician supervision. Selected images were sent to PACS. No evidence of hemorrhagic transformation. The catheter was subsequently withdrawn. Right common femoral artery angiogram was obtained in right anterior oblique view. The puncture is at the level of the common femoral artery. The artery has normal caliber, adequate for closure device. The sheath was exchanged over the wire for an 8 [place] Angio-Seal which was utilized for access closure. Immediate hemostasis was achieved. IMPRESSION: Mechanical thrombectomy performed for treatment of a left M3/MCA posterior division branch occlusion achieving adequate recanalization (TICI 2b). Persistent occlusion of a posterior parietal cortical branch noted. No hemorrhagic complication. PLAN: Transferred to ICU for continued post stroke care. Electronically Signed   By:  Pedro Earls M.D.   On: 03/25/2022 17:18   CT ANGIO HEAD NECK W WO CM W PERF (CODE STROKE)  Result Date: 03/25/2022 CLINICAL DATA:  Provided history: Neuro deficit, acute, stroke suspected. EXAM: CT ANGIOGRAPHY HEAD AND NECK CT PERFUSION BRAIN TECHNIQUE: Multidetector CT imaging of the head and neck was performed using the standard protocol during bolus administration of intravenous contrast. Multiplanar CT image reconstructions and MIPs were obtained to evaluate the vascular anatomy. Carotid stenosis measurements (when applicable) are obtained utilizing  NASCET criteria, using the distal internal carotid diameter as the denominator. Multiphase CT imaging of the brain was performed following IV bolus contrast injection. Subsequent parametric perfusion maps were calculated using RAPID software. RADIATION DOSE REDUCTION: This exam was performed according to the departmental dose-optimization program which includes automated exposure control, adjustment of the mA and/or kV according to patient size and/or use of iterative reconstruction technique. CONTRAST:  150m OMNIPAQUE IOHEXOL 350 MG/ML SOLN COMPARISON:  Noncontrast head CT performed earlier today 03/25/2022. FINDINGS: CTA NECK FINDINGS Aortic arch: Standard aortic branching. Atherosclerotic plaque within the visualized aortic arch and proximal major branch vessels of the neck. No hemodynamically significant innominate or proximal subclavian artery stenosis. Right carotid system: CCA and ICA patent within the neck. Prominent calcified plaque about the carotid bifurcation and within the proximal ICA. Severe stenosis at the origin of the ICA (estimated to be 80-90%). Left carotid system: CCA and ICA patent within the neck without hemodynamically significant stenosis (50% or greater). Atherosclerotic plaque scattered within the CCA, about the carotid bifurcation and within the proximal ICA. Vertebral arteries: The non dominant right vertebral artery is developmentally diminutive, but patent throughout the neck. The dominant left vertebral artery is patent within the neck. Mild atherosclerotic narrowing at the origin of this vessel. Skeleton: Grade 1 anterolisthesis at C2-C3, C3-C4, C4-C5. Grade 1 retrolisthesis at C5-C6. Grade 1 anterolisthesis at C6-C7 and C7-T1. Cervical spondylosis. Multilevel spinal canal narrowing. Most notably at C5-C6, a posterior disc osteophyte complex contributes to apparent moderate spinal canal stenosis. Multilevel bony neural foraminal narrowing. Disc degeneration is advanced at C4-C5,  C5-C6 and C6-C7. Other neck: No neck mass or cervical lymphadenopathy. Upper chest: Bronchiectasis and scattered tree-in-bud opacities within the bilateral upper lobes. The tree-in-bud opacities likely reflect sequelae of chronic infection. Biapical pleuroparenchymal scarring. Review of the MIP images confirms the above findings CTA HEAD FINDINGS Anterior circulation: The intracranial internal carotid arteries are patent. Nonstenotic calcified plaque within both vessels. The M1 middle cerebral arteries are patent. Abrupt occlusion of a proximal to mid M2 left MCA vessel (series 14, image 27) (series 9, image 91). The anterior cerebral arteries are patent. No intracranial aneurysm is identified. Posterior circulation: Diminutive intracranial right vertebral artery with superimposed high-grade atherosclerotic stenosis distally. The dominant intracranial left vertebral artery is patent without stenosis. The basilar artery is patent. The posterior cerebral arteries are patent. Atherosclerotic irregularity of both vessels without high-grade proximal stenosis. The posterior cerebral arteries are fetal in origin, bilaterally. Venous sinuses: Within the limitations of contrast timing, no convincing thrombus. Anatomic variants: As described. Review of the MIP images confirms the above findings CT Brain Perfusion Findings: CBF (<30%) Volume: 036mPerfusion (Tmax>6.0s) volume: 1238mleft MCA vascular territory) Mismatch Volume: 60m59mfarction Location:None identified CTA head impression #1 called by telephone at the time of interpretation on 03/25/2022 at 12:15 pm to provider Dr. KirkLeonel Ramsayo verbally acknowledged these results. IMPRESSION: CTA neck: 1. The common carotid and internal carotid arteries are patent within the  neck. Atherosclerotic plaque, bilaterally. Most notably, there is a severe (estimated 80-90% stenosis) at the origin of the right ICA. 2. Vertebral arteries patent within the neck. Mild atherosclerotic  narrowing at the origin of the dominant left vertebral artery. 3.  Aortic Atherosclerosis (ICD10-I70.0). 4. Bronchiectasis and scattered tree-in-bud pulmonary opacities within the bilateral upper lobes. The tree-in-bud opacities likely reflect sequelae of chronic infection. 5. Cervical spondylosis. CTA head: 1. Occluded proximal-to-mid M2 left MCA vessel. 2. Additional intracranial atherosclerotic disease, as described. Most notably, there is a severe stenosis within the distal V4 right vertebral artery (the non-dominant vertebral artery). CT perfusion head: The perfusion software identifies a 12 mL region of critically hypoperfused parenchyma within the left MCA vascular territory. No core infarct is identified. Reported mismatch volume: 12 mL. Electronically Signed   By: Kellie Simmering D.O.   On: 03/25/2022 12:19   CT HEAD CODE STROKE WO CONTRAST  Result Date: 03/25/2022 CLINICAL DATA:  Code stroke. Neuro deficit, acute, stroke suspected. EXAM: CT HEAD WITHOUT CONTRAST TECHNIQUE: Contiguous axial images were obtained from the base of the skull through the vertex without intravenous contrast. RADIATION DOSE REDUCTION: This exam was performed according to the departmental dose-optimization program which includes automated exposure control, adjustment of the mA and/or kV according to patient size and/or use of iterative reconstruction technique. COMPARISON:  Head CT 12/27/2020. FINDINGS: Brain: Mild generalized parenchymal atrophy. Redemonstrated small chronic infarct within the right cerebellar hemisphere (series 2, image 10). Tiny infarct within the left cerebellar hemisphere, new from the prior head CT of 12/27/2020 but otherwise age-indeterminate (series 5, image 51) (series 6, image 42). There is no acute intracranial hemorrhage. No demarcated cortical infarct. No extra-axial fluid collection. No evidence of an intracranial mass. No midline shift. Vascular: Atherosclerotic calcifications. Calcific focus within  the left sylvian fissure, new from prior exams and concerning for a calcified embolus within an M2 left MCA vessel (series 5, image 31). Skull: No fracture or aggressive osseous lesion. Sinuses/Orbits: No mass or acute finding within the imaged orbits. Mild mucosal thickening within the right maxillary sinus at the imaged levels. Small to moderate-volume secretions within the left maxillary sinus at the imaged levels. Small-volume secretions within the right sphenoid sinus. Minimal mucosal thickening within the anterior ethmoid air cells, bilaterally. ASPECTS [facility] Stroke Program Early CT Score) - Ganglionic level infarction (caudate, lentiform nuclei, internal capsule, insula, M1-M3 cortex): 7 - Supraganglionic infarction (M4-M6 cortex): 3 Total score (0-10 with 10 being normal): 10 Pertinent results were communicated to Dr. Leonie Man At 11:23 amon 9/1/2023by text page via the [facility] messaging system. IMPRESSION: 1. No acute intracranial hemorrhage or acute demarcated cortical infarct. 2. Suspected calcified embolus within a left M2 MCA vessel, as described. Correlate with findings on pending CTA head/neck. 3. Tiny infarct within the left cerebellar hemisphere, new from the prior head CT of 12/27/2020 but otherwise age-indeterminate. 4. 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 5. Mild generalized parenchymal atrophy. 6. Paranasal sinus disease at the imaged levels, as described. Electronically Signed   By: Kellie Simmering D.O.   On: 03/25/2022 11:27     PHYSICAL EXAM  Temp:  [97.6 F (36.4 C)-98.7 F (37.1 C)] 97.6 F (36.4 C) (09/05 1239) Pulse Rate:  [67-85] 67 (09/05 1239) Resp:  [18-20] 20 (09/05 1239) BP: (108-153)/(47-70) 130/47 (09/05 1239) SpO2:  [93 %-99 %] 97 % (09/05 1239)  General - Well nourished, well developed, in no apparent distress.  Cardiovascular - Regular rhythm and rate, not in afib  during round.  Neuro - awake, alert, eyes open, mild to  moderate expressive aphasia with word finding difficulty, anomia and able to repeat first half a sentence only.  Also has partial receptive aphasia, not able to follow two step commands. No gaze palsy, tracking bilaterally, visual field full ,No facial droop. Tongue midline. Bilateral UEs 5/5, no drift. Bilaterally LEs 5/5, no drift. Sensation symmetrical bilaterally subjectively, L FTN intact, R FTN ataxic, gait not tested.    ASSESSMENT/PLAN Ms. Izzy Doubek is a 87 y.o. 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 No tPA given due to on Eliquis and being outside window.    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 Ongoing aggressive stroke risk factor management Therapy recommendations: SNF Disposition: Awaiting SNF placement  PAF Now on NSR on tele Eliquis 2.5 twice daily at home, states to have been  compliant Likely failed Eliquis this time Aspirin switch to Pradaxa 150 bid  Carotid stenosis Right ICA 80 to 90% stenosis on CTA Known chronic right ICA stenosis by PCP per daughter Follow-up as outpatient with PCP  Hypertension Stable BP goal < 180/105 Long term BP goal normotensive  Hyperlipidemia Home meds: Crestor 5 LDL 103, goal < 70 Now on Crestor 20 mg po qhs Continue statin at discharge  Other Stroke Risk Factors Advanced age Coronary  artery disease Status post TAVR 07/2021 Takotsubo cardiomyopathy  Other Active Problems none  Hospital day # 4   NLoletha GrayerStroke Neurology 03/29/2022 1:02 PM  ATTENDING NOTE: I reviewed above note and agree with the assessment and plan. Pt was seen and examined.   No family at bedside.  No acute event overnight.  Patient sitting in chair having lunch.  Language more fluent than yesterday, word finding difficulty less prominent than before.  On Pradaxa 150 twice daily, tolerating well.  Still pending SNF placement.  For detailed assessment and plan, please refer to above/below as I have made changes wherever appropriate.   JRosalin Hawking MD PhD Stroke Neurology 03/29/2022 3:06 PM    To contact Stroke Continuity provider, please refer to [email or web address] After hours, contact General Neurology

## 2022-03-30 ENCOUNTER — Encounter: Payer: Self-pay | Admitting: Cardiology

## 2022-03-30 ENCOUNTER — Telehealth: Payer: Self-pay

## 2022-03-30 DIAGNOSIS — E7800 Pure hypercholesterolemia, unspecified: Secondary | ICD-10-CM | POA: Diagnosis not present

## 2022-03-30 DIAGNOSIS — I10 Essential (primary) hypertension: Secondary | ICD-10-CM

## 2022-03-30 DIAGNOSIS — I63412 Cerebral infarction due to embolism of left middle cerebral artery: Secondary | ICD-10-CM | POA: Diagnosis not present

## 2022-03-30 DIAGNOSIS — I480 Paroxysmal atrial fibrillation: Secondary | ICD-10-CM | POA: Diagnosis not present

## 2022-03-30 LAB — CBC
HCT: 31.1 % — ABNORMAL LOW (ref 36.0–46.0)
Hemoglobin: 10.1 g/dL — ABNORMAL LOW (ref 12.0–15.0)
MCH: 29.2 pg (ref 26.0–34.0)
MCHC: 32.5 g/dL (ref 30.0–36.0)
MCV: 89.9 fL (ref 80.0–100.0)
Platelets: 265 10*3/uL (ref 150–400)
RBC: 3.46 MIL/uL — ABNORMAL LOW (ref 3.87–5.11)
RDW: 13.6 % (ref 11.5–15.5)
WBC: 6.9 10*3/uL (ref 4.0–10.5)
nRBC: 0 % (ref 0.0–0.2)

## 2022-03-30 LAB — BASIC METABOLIC PANEL
Anion gap: 8 (ref 5–15)
BUN: 16 mg/dL (ref 8–23)
CO2: 27 mmol/L (ref 22–32)
Calcium: 9.2 mg/dL (ref 8.9–10.3)
Chloride: 100 mmol/L (ref 98–111)
Creatinine, Ser: 0.93 mg/dL (ref 0.44–1.00)
GFR, Estimated: >60 mL/min (ref >60)
Glucose, Bld: 101 mg/dL — ABNORMAL HIGH (ref 70–99)
Potassium: 3.7 mmol/L (ref 3.5–5.1)
Sodium: 135 mmol/L (ref 135–145)

## 2022-03-30 MED ORDER — ROSUVASTATIN CALCIUM 20 MG PO TABS: 20.0000 mg | ORAL_TABLET | Freq: Every day | ORAL | 3 refills | Status: DC

## 2022-03-30 MED ORDER — DABIGATRAN ETEXILATE MESYLATE 150 MG PO CAPS: 150.0000 mg | ORAL_CAPSULE | Freq: Two times a day (BID) | ORAL | 5 refills | Status: DC

## 2022-03-30 NOTE — Discharge Summary (Signed)
Stroke Discharge Summary  Patient ID: Barbara Mcconnell   MRN: [id number]      DOB: 1934/12/12  Date of Admission: 03/25/2022 Date of Discharge: 03/30/2022  Attending Physician:  Stroke, Md, MD, Stroke MD Consultant(s):     interventional radiologu   Patient's PCP:  Barbara Honour, MD  DISCHARGE DIAGNOSIS:  Stroke:  left MCA infarct with left M2 occlusion s/p IR with reocclusion of left M3, embolic secondary to afib even on eliquis  Principal Problem: PAF Carotid stenosis HTN HLD CAD S/p TAVR Takotsubo cardiomyopathy   Allergies as of 03/30/2022       Reactions   Codeine Nausea And Vomiting   Cyclobenzaprine Other (See Comments)   UNK   Iodinated Contrast Media Nausea Only   Prednisone Other (See Comments)   "Sores in the mouth"        Medication List     STOP taking these medications    apixaban 2.5 MG Tabs tablet Commonly known as: ELIQUIS   docusate sodium 100 MG capsule Commonly known as: COLACE       TAKE these medications    acetaminophen 500 MG tablet Commonly known as: TYLENOL Take 500-1,000 mg by mouth every 6 (six) hours as needed for moderate pain.   albuterol 108 (90 Base) MCG/ACT inhaler Commonly known as: VENTOLIN HFA Inhale 2 puffs into the lungs every 4 (four) hours as needed for wheezing or shortness of breath (seasonal allergies).   ASPERCREME EX Apply 1 application topically daily as needed (knee pain).   dabigatran 150 MG Caps capsule Commonly known as: PRADAXA Take 1 capsule (150 mg total) by mouth every 12 (twelve) hours.   diltiazem 180 MG 24 hr capsule Commonly known as: CARDIZEM CD Take 180 mg by mouth as needed (Elevated heart rate).   EMERGEN-C VITAMIN C PO Take 1 packet by mouth daily. Mix with water   escitalopram 5 MG tablet Commonly known as: Lexapro Take 1 tablet (5 mg total) by mouth daily.   fexofenadine 60 MG tablet Commonly known as: ALLEGRA Take 60 mg by mouth daily.   FISH OIL PO Take 1,000 mg by  mouth daily.   furosemide 20 MG tablet Commonly known as: LASIX Take 0.5 tablets (10 mg total) by mouth 2 (two) times daily.   lisinopril 5 MG tablet Commonly known as: ZESTRIL Take 1.25 mg by mouth daily.   melatonin 5 MG Tabs Take 5 mg by mouth at bedtime.   metoprolol tartrate 25 MG tablet Commonly known as: LOPRESSOR TAKE ONE TABLET BY MOUTH TWICE A DAY   montelukast 10 MG tablet Commonly known as: SINGULAIR Take 1 tablet (10 mg total) by mouth daily after supper.   multivitamin with minerals Tabs tablet Take 1 tablet by mouth daily. Centrum 50 plus   omeprazole 20 MG capsule Commonly known as: PRILOSEC Take 20 mg by mouth daily before breakfast.   rosuvastatin 20 MG tablet Commonly known as: CRESTOR Take 1 tablet (20 mg total) by mouth daily. Start taking on: March 31, 2022 What changed:  medication strength how much to take   VITAMIN D3 PO Take 1,000 Units by mouth daily.        LABORATORY STUDIES CBC    Component Value Date/Time   WBC 6.9 03/30/2022 0501   RBC 3.46 (L) 03/30/2022 0501   HGB 10.1 (L) 03/30/2022 0501   HCT 31.1 (L) 03/30/2022 0501   PLT 265 03/30/2022 0501   MCV 89.9 03/30/2022 0501   MCH  29.2 03/30/2022 0501   MCHC 32.5 03/30/2022 0501   RDW 13.6 03/30/2022 0501   LYMPHSABS 2.3 03/25/2022 1059   MONOABS 0.4 03/25/2022 1059   EOSABS 0.1 03/25/2022 1059   BASOSABS 0.0 03/25/2022 1059   CMP    Component Value Date/Time   NA 135 03/30/2022 0501   NA 136 05/31/2019 1428   K 3.7 03/30/2022 0501   CL 100 03/30/2022 0501   CO2 27 03/30/2022 0501   GLUCOSE 101 (H) 03/30/2022 0501   BUN 16 03/30/2022 0501   BUN 20 05/31/2019 1428   CREATININE 0.93 03/30/2022 0501   CREATININE 0.89 02/15/2022 1119   CALCIUM 9.2 03/30/2022 0501   PROT 6.5 03/25/2022 1059   ALBUMIN 3.6 03/25/2022 1059   AST 46 (H) 03/25/2022 1059   ALT 35 03/25/2022 1059   ALKPHOS 54 03/25/2022 1059   BILITOT 1.2 03/25/2022 1059   GFRNONAA >60 03/30/2022  0501   GFRAA 65 05/31/2019 1428   COAGS Lab Results  Component Value Date   INR 1.1 03/25/2022   INR 1.1 07/30/2021   INR 1.0 07/21/2021   Lipid Panel    Component Value Date/Time   CHOL 204 (H) 03/26/2022 0137   TRIG 77 03/26/2022 0137   HDL 86 03/26/2022 0137   CHOLHDL 2.4 03/26/2022 0137   VLDL 15 03/26/2022 0137   LDLCALC 103 (H) 03/26/2022 0137   LDLCALC 95 02/15/2022 1119   HgbA1C  Lab Results  Component Value Date   HGBA1C 5.4 03/26/2022   Urinalysis    Component Value Date/Time   COLORURINE YELLOW 02/07/2022 1524   APPEARANCEUR CLEAR 02/07/2022 1524   LABSPEC 1.009 02/07/2022 1524   PHURINE 5.5 02/07/2022 1524   GLUCOSEU NEGATIVE 02/07/2022 1524   HGBUR NEGATIVE 02/07/2022 1524   BILIRUBINUR Negative 02/07/2022 1502   KETONESUR NEGATIVE 02/07/2022 1524   PROTEINUR NEGATIVE 02/07/2022 1524   PROTEINUR Negative 02/07/2022 1502   UROBILINOGEN negative (A) 02/07/2022 1502   NITRITE NEGATIVE 02/07/2022 1524   NITRITE Negative 02/07/2022 1502   LEUKOCYTESUR TRACE (A) 02/07/2022 1524   LEUKOCYTESUR Negative 02/07/2022 1502   Urine Drug Screen     Component Value Date/Time   LABOPIA NONE DETECTED 05/13/2019 1056   COCAINSCRNUR NONE DETECTED 05/13/2019 1056   LABBENZ NONE DETECTED 05/13/2019 1056   AMPHETMU NONE DETECTED 05/13/2019 1056   THCU NONE DETECTED 05/13/2019 1056   LABBARB NONE DETECTED 05/13/2019 1056    Alcohol Level    Component Value Date/Time   ETH <10 03/25/2022 1104     SIGNIFICANT DIAGNOSTIC STUDIES ECHOCARDIOGRAM COMPLETE  Result Date: 03/26/2022    ECHOCARDIOGRAM REPORT   Patient Name:   Barbara Mcconnell Date of Exam: 03/26/2022 Medical Rec #:  [id number]    Height:       60.0 in Accession #:    [id number]   Weight:       128.7 lb Date of Birth:  Sep 10, 1934     BSA:          1.548 m Patient Age:    87 years     BP:           125/66 mmHg Patient Gender: F            HR:           69 bpm. Exam Location:  Inpatient Procedure: 2D Echo, Cardiac  Doppler and Color Doppler Indications:    Stroke  History:        Patient has prior history of Echocardiogram examinations,  most                 recent 09/10/2021. CAD, Aortic Valve Disease, Arrythmias:Atrial                 Fibrillation; Risk Factors:Hypertension, Dyslipidemia and Former                 Smoker.                 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 Procedure Date: 08/03/21.  Sonographer:    Clayton Lefort RDCS (AE) Referring Phys: [id number] [place]  1. Left ventricular ejection fraction, by estimation, is 65 to 70%. The left ventricle has normal function. The left ventricle has no regional wall motion abnormalities. There is moderate left ventricular hypertrophy. Left ventricular diastolic parameters are indeterminate.  2. Right ventricular systolic function is normal. The right ventricular size is normal. Tricuspid regurgitation signal is inadequate for assessing PA pressure.  3. Left atrial size was moderately dilated.  4. The mitral valve is degenerative. Trivial mitral valve regurgitation. Mild mitral stenosis. The mean mitral valve gradient is 5.0 mmHg with average heart rate of 68 bpm. Severe mitral annular calcification.  5. The abnormal color flow signal seen previously below the prosthetic aortic valve is seen again on this exam, color flow in multiple views suggestive of valvular regurgitation, though it cannot be demonstrated with spectral Doppler, which would support color flow artifact, possibly from stent frame of valve. No evidence of prosthetic valve obstruction or significant regurgitation.     The aortic valve has been repaired/replaced. Aortic valve regurgitation is mild. 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 Procedure Date: 08/03/21. Echo findings are consistent with likely stable structure and function of the aortic valve prosthesis. Aortic valve area, by  VTI measures 2.58 cm. Aortic valve mean gradient measures 11.0 mmHg. Aortic valve Vmax measures 1.98 m/s. Aortic valve acceleration time measures 85 msec.  6. The inferior vena cava is normal in size with greater than 50% respiratory variability, suggesting right atrial pressure of 3 mmHg. FINDINGS  Left Ventricle: Left ventricular ejection fraction, by estimation, is 65 to 70%. The left ventricle has normal function. The left ventricle has no regional wall motion abnormalities. The left ventricular internal cavity size was normal in size. There is  moderate left ventricular hypertrophy. Left ventricular diastolic parameters are indeterminate. Right Ventricle: The right ventricular size is normal. No increase in right ventricular wall thickness. Right ventricular systolic function is normal. Tricuspid regurgitation signal is inadequate for assessing PA pressure. Left Atrium: Left atrial size was moderately dilated. Right Atrium: Right atrial size was normal in size. Pericardium: There is no evidence of pericardial effusion. Mitral Valve: The mitral valve is degenerative in appearance. Severe mitral annular calcification. Trivial mitral valve regurgitation. Mild mitral valve stenosis. MV peak gradient, 13.1 mmHg. The mean mitral valve gradient is 5.0 mmHg with average heart rate of 68 bpm. Tricuspid Valve: The tricuspid valve is normal in structure. Tricuspid valve regurgitation is mild . No evidence of tricuspid stenosis. Aortic Valve: The abnormal color flow signal seen previously below the prosthetic aortic valve is seen again on this exam, color flow in multiple views suggestive of valvular regurgitation, though it cannot be demonstrated with spectral Doppler, which would support color flow artifact, possibly  from stent frame of valve. No evidence of prosthetic valve obstruction or significant regurgitation. The aortic valve has been repaired/replaced. Aortic valve regurgitation is mild. Aortic valve mean  gradient measures 11.0 mmHg. Aortic valve peak gradient measures 15.8 mmHg. Aortic valve area, by VTI measures 2.58 cm. 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 Procedure Date: 08/03/21. Echo findings are consistent  with normal structure and function of the aortic valve prosthesis. Pulmonic Valve: The pulmonic valve was normal in structure. Pulmonic valve regurgitation is trivial. No evidence of pulmonic stenosis. Aorta: The aortic root is normal in size and structure. Venous: The inferior vena cava is normal in size with greater than 50% respiratory variability, suggesting right atrial pressure of 3 mmHg. IAS/Shunts: No atrial level shunt detected by color flow Doppler.  LEFT VENTRICLE PLAX 2D LVIDd:         4.30 cm   Diastology LVIDs:         2.20 cm   LV e' medial:    4.46 cm/s LV PW:         1.40 cm   LV E/e' medial:  27.6 LV IVS:        1.30 cm   LV e' lateral:   6.20 cm/s LVOT diam:     2.30 cm   LV E/e' lateral: 19.8 LV SV:         128 LV SV Index:   83 LVOT Area:     4.15 cm  RIGHT VENTRICLE             IVC RV Basal diam:  2.70 cm     IVC diam: 1.40 cm RV S prime:     13.70 cm/s TAPSE (M-mode): 2.7 cm LEFT ATRIUM             Index        RIGHT ATRIUM           Index LA diam:        4.60 cm 2.97 cm/m   RA Area:     17.10 cm LA Vol (A2C):   76.2 ml 49.23 ml/m  RA Volume:   44.40 ml  28.68 ml/m LA Vol (A4C):   76.5 ml 49.42 ml/m LA Biplane Vol: 76.6 ml 49.48 ml/m  AORTIC VALVE AV Area (Vmax):    2.99 cm AV Area (Vmean):   2.47 cm AV Area (VTI):     2.58 cm AV Vmax:           198.46 cm/s AV Vmean:          163.147 cm/s AV VTI:            0.495 m AV Peak Grad:      15.8 mmHg AV Mean Grad:      11.0 mmHg LVOT Vmax:         143.00 cm/s LVOT Vmean:        96.800 cm/s LVOT VTI:          0.308 m LVOT/AV VTI ratio: 0.62  AORTA Ao Root diam: 3.30 cm MITRAL VALVE MV Area (PHT): 2.09 cm     SHUNTS MV Area VTI:   2.05 cm     Systemic VTI:  0.31 m MV Peak grad:   13.1 mmHg    Systemic Diam: 2.30 cm MV Mean grad:  5.0 mmHg MV Vmax:       1.81 m/s MV Vmean:      100.0 cm/s MV  Decel Time: 363 msec MV E velocity: 123.00 cm/s MV A velocity: 159.00 cm/s MV E/A ratio:  0.77 Cherlynn Kaiser MD Electronically signed by Cherlynn Kaiser MD Signature Date/Time: 03/26/2022/4:09:16 PM    Final    MR BRAIN WO CONTRAST  Result Date: 03/26/2022 CLINICAL DATA:  87 year old female code stroke presentation. Left MCA M2/M3 branch occlusion status post endovascular reperfusion. EXAM: MRI HEAD WITHOUT CONTRAST TECHNIQUE: Multiplanar, multiecho pulse sequences of the brain and surrounding structures were obtained without intravenous contrast. COMPARISON:  CT head, CTA, CTP yesterday. FINDINGS: Brain: Posterior left sylvian fissure and lateral perirolandic region restricted diffusion corresponding to T-max abnormality on CTP yesterday. This is in an area of about 3.5 cm (series 5, image 76 and series 7, image 44). Some additional cortical restricted diffusion in the left insula, and multiple tiny additional cortical infarcts scattered in both the anterior left frontal lobe (series 5, image 77) and left MCA/PCA watershed including the superior occipital pole (image 71). There is also a tiny contralateral right superior frontal gyrus pre motor cortical focus of restricted diffusion series 5, image 84. No other contralateral restricted diffusion. No posterior fossa restricted diffusion. T2 and FLAIR hyperintense cytotoxic edema in the affected areas. No acute intracranial hemorrhage identified. No midline shift, mass effect, or evidence of intracranial mass lesion. No ventriculomegaly. Normal for age cerebral volume. Negative pituitary and cervicomedullary junction. Minimal for age scattered nonspecific cerebral white matter T2 and FLAIR hyperintensity. There are several small chronic infarcts in the right cerebellum AICA or SCA territory (series 10, image 7). Vascular: Major intracranial vascular  flow voids are preserved. Dominant distal left vertebral artery as seen yesterday. Skull and upper cervical spine: Multilevel degenerative appearing cervical spine spondylolisthesis, disc degeneration. Hyperostosis of the calvarium. Visualized bone marrow signal is within normal limits. Sinuses/Orbits: Some leftward gaze. Stable mild paranasal sinus mucosal thickening and/or fluid. Other: Mastoids are clear. Grossly normal visible internal auditory structures. Negative visible scalp and face. IMPRESSION: 1. Left MCA territory ischemia, most confluent in the area of the T-max abnormality yesterday. Additional tiny scattered cortical infarcts also at the left MCA/PCA watershed, and a solitary lesion in the right superior frontal gyrus. 2. No associated hemorrhage or mass effect. 3. Small chronic right cerebellar infarcts. Electronically Signed   By: Genevie Ann M.D.   On: 03/26/2022 05:01   IR PERCUTANEOUS ART THROMBECTOMY/INFUSION INTRACRANIAL INC DIAG ANGIO  Result Date: 03/25/2022 INDICATION: 87 year old female past medical history significant for anxiety, arthritis, CAD, GERD, heart murmur, hemorrhoids, HLD, HTN, afib on eliquis, TAVR, and takotsubo cardiomyopathy; baseline modified Rankin scale 1. She presented with aphasia; NIHSS 4. Her last known well was 9:30 p.m. on 03/24/2022. Head CT showed no large acute territorial infarct or hemorrhage. CT angiogram of the head and neck showed an occlusion of a left M3 branch with a 12 mL ischemic penumbra on CT perfusion. She was transferred to our service for emergency mechanical thrombectomy. EXAM: ULTRASOUND-GUIDED VASCULAR ACCESS DIAGNOSTIC CEREBRAL ANGIOGRAM MECHANICAL FLAT PANEL HEAD CT COMPARISON:  CT/CT angiogram of the head and neck March 25, 2022. MEDICATIONS: No antibiotics administered. ANESTHESIA/SEDATION: The procedure was performed under general anesthesia. CONTRAST:  75 mL of Omnipaque 300 milligram/mL FLUOROSCOPY: Radiation Exposure Index (as provided  by the fluoroscopic device): 6720 mGy Kerma COMPLICATIONS: None immediate. TECHNIQUE: Informed written consent was obtained from the patient's daughter after a thorough discussion of the procedural risks, benefits and alternatives. All questions were addressed. Maximal Sterile Barrier Technique was utilized including caps, mask, sterile gowns, sterile gloves,  sterile drape, hand hygiene and skin antiseptic. A timeout was performed prior to the initiation of the procedure. The right groin was prepped and draped in the usual sterile fashion. Using a micropuncture kit and the modified Seldinger technique, access was gained to the right common femoral artery and an 8 French sheath was placed. Real-time ultrasound guidance was utilized for vascular access including the acquisition of a permanent ultrasound image documenting patency of the accessed vessel. Under fluoroscopy, a Zoom 88 guide catheter was navigated over a 6 [place] VTK catheter and a 0.035" Terumo Glidewire into the aortic arch. The catheter was placed into the left common carotid artery and then advanced into the left internal carotid artery. The diagnostic catheter was removed. Frontal and lateral angiograms of the head were obtained. FINDINGS: 1. Atherosclerotic changes of the right common femoral artery with normal caliber, adequate for vascular access. 2. There is an occlusion of a left M3/MCA posterior division branch with wedge defects noted in the posterior parietal area. PROCEDURE: Using biplane roadmap, a Zoom 35 aspiration catheter was navigated over an Aristotle 24 microguidewire into the cavernous segment of the left ICA. The aspiration catheter was then advanced to the level of occlusion in the left M3 segment and connected to an aspiration pump. Continuous aspiration was performed for 2 minutes. The guide catheter was connected to a VacLok syringe. The aspiration catheter was subsequently removed under constant aspiration. The guide catheter  was aspirated for debris. Left internal carotid artery angiogram with magnified frontal lateral views of the head showed recanalization of the left M3 branch with mild vasospasm. A small wedge defect is noted in the posterior parietal region. Follow-up angiogram showed worsening of vasospasm with filling defect in the proximal aspect of the M3 branch. Using biplane roadmap guidance, a phenom 21 microcatheter and a Aristotle 14 microguidewire into the cavernous segment of the left ICA. The microcatheter was then navigated over the wire into the left M3/MCA branch. Then, 5 mg of verapamil were infused within the M3 branch. Follow-up angiogram showed worsening of the vasospasm with associated filling defect. Using biplane roadmap guidance, a phenom 21 microcatheter and a Aristotle 14 microguidewire into the cavernous segment of the left ICA. The microcatheter was then navigated over the wire into the left M3 segment. Then, a 3 mm solitaire stent retriever was deployed spanning the M3 segment. The device was allowed to intercalated with the clot for 4 minutes. The thrombectomy device was then removed. Follow-up angiograms with magnified frontal and lateral views of the head showed complete recanalization of that M3 branch with persistent small wedge defect in the posterior parietal region. Arterial opacification via collateral flow is noted in the venous phase at the region of the arterial phase wedge defect, consistent with retrograde arterial filling via collaterals. Multiple magnified angiograms were obtained with different projections. However origin of the branch was not identified. The mild early draining vein noted in the region of the wedge defect, suggesting ongoing ischemia. Flat panel CT of the head was obtained and post processed in a separate workstation with concurrent attending physician supervision. Selected images were sent to PACS. No evidence of hemorrhagic transformation. The catheter was  subsequently withdrawn. Right common femoral artery angiogram was obtained in right anterior oblique view. The puncture is at the level of the common femoral artery. The artery has normal caliber, adequate for closure device. The sheath was exchanged over the wire for an 8 [place] Angio-Seal which was utilized for access closure. Immediate hemostasis was  achieved. IMPRESSION: Mechanical thrombectomy performed for treatment of a left M3/MCA posterior division branch occlusion achieving adequate recanalization (TICI 2b). Persistent occlusion of a posterior parietal cortical branch noted. No hemorrhagic complication. PLAN: Transferred to ICU for continued post stroke care. Electronically Signed   By: Pedro Earls M.D.   On: 03/25/2022 17:18   IR US Guide Vasc Access Right  Result Date: 03/25/2022 INDICATION: 87 year old female past medical history significant for anxiety, arthritis, CAD, GERD, heart murmur, hemorrhoids, HLD, HTN, afib on eliquis, TAVR, and takotsubo cardiomyopathy; baseline modified Rankin scale 1. She presented with aphasia; NIHSS 4. Her last known well was 9:30 p.m. on 03/24/2022. Head CT showed no large acute territorial infarct or hemorrhage. CT angiogram of the head and neck showed an occlusion of a left M3 branch with a 12 mL ischemic penumbra on CT perfusion. She was transferred to our service for emergency mechanical thrombectomy. EXAM: ULTRASOUND-GUIDED VASCULAR ACCESS DIAGNOSTIC CEREBRAL ANGIOGRAM MECHANICAL FLAT PANEL HEAD CT COMPARISON:  CT/CT angiogram of the head and neck March 25, 2022. MEDICATIONS: No antibiotics administered. ANESTHESIA/SEDATION: The procedure was performed under general anesthesia. CONTRAST:  75 mL of Omnipaque 300 milligram/mL FLUOROSCOPY: Radiation Exposure Index (as provided by the fluoroscopic device): 5361 mGy Kerma COMPLICATIONS: None immediate. TECHNIQUE: Informed written consent was obtained from the patient's daughter after a thorough  discussion of the procedural risks, benefits and alternatives. All questions were addressed. Maximal Sterile Barrier Technique was utilized including caps, mask, sterile gowns, sterile gloves, sterile drape, hand hygiene and skin antiseptic. A timeout was performed prior to the initiation of the procedure. The right groin was prepped and draped in the usual sterile fashion. Using a micropuncture kit and the modified Seldinger technique, access was gained to the right common femoral artery and an 8 French sheath was placed. Real-time ultrasound guidance was utilized for vascular access including the acquisition of a permanent ultrasound image documenting patency of the accessed vessel. Under fluoroscopy, a Zoom 88 guide catheter was navigated over a 6 [place] VTK catheter and a 0.035" Terumo Glidewire into the aortic arch. The catheter was placed into the left common carotid artery and then advanced into the left internal carotid artery. The diagnostic catheter was removed. Frontal and lateral angiograms of the head were obtained. FINDINGS: 1. Atherosclerotic changes of the right common femoral artery with normal caliber, adequate for vascular access. 2. There is an occlusion of a left M3/MCA posterior division branch with wedge defects noted in the posterior parietal area. PROCEDURE: Using biplane roadmap, a Zoom 35 aspiration catheter was navigated over an Aristotle 24 microguidewire into the cavernous segment of the left ICA. The aspiration catheter was then advanced to the level of occlusion in the left M3 segment and connected to an aspiration pump. Continuous aspiration was performed for 2 minutes. The guide catheter was connected to a VacLok syringe. The aspiration catheter was subsequently removed under constant aspiration. The guide catheter was aspirated for debris. Left internal carotid artery angiogram with magnified frontal lateral views of the head showed recanalization of the left M3 branch with mild  vasospasm. A small wedge defect is noted in the posterior parietal region. Follow-up angiogram showed worsening of vasospasm with filling defect in the proximal aspect of the M3 branch. Using biplane roadmap guidance, a phenom 21 microcatheter and a Aristotle 14 microguidewire into the cavernous segment of the left ICA. The microcatheter was then navigated over the wire into the left M3/MCA branch. Then, 5 mg of verapamil were infused within  the M3 branch. Follow-up angiogram showed worsening of the vasospasm with associated filling defect. Using biplane roadmap guidance, a phenom 21 microcatheter and a Aristotle 14 microguidewire into the cavernous segment of the left ICA. The microcatheter was then navigated over the wire into the left M3 segment. Then, a 3 mm solitaire stent retriever was deployed spanning the M3 segment. The device was allowed to intercalated with the clot for 4 minutes. The thrombectomy device was then removed. Follow-up angiograms with magnified frontal and lateral views of the head showed complete recanalization of that M3 branch with persistent small wedge defect in the posterior parietal region. Arterial opacification via collateral flow is noted in the venous phase at the region of the arterial phase wedge defect, consistent with retrograde arterial filling via collaterals. Multiple magnified angiograms were obtained with different projections. However origin of the branch was not identified. The mild early draining vein noted in the region of the wedge defect, suggesting ongoing ischemia. Flat panel CT of the head was obtained and post processed in a separate workstation with concurrent attending physician supervision. Selected images were sent to PACS. No evidence of hemorrhagic transformation. The catheter was subsequently withdrawn. Right common femoral artery angiogram was obtained in right anterior oblique view. The puncture is at the level of the common femoral artery. The artery  has normal caliber, adequate for closure device. The sheath was exchanged over the wire for an 8 [place] Angio-Seal which was utilized for access closure. Immediate hemostasis was achieved. IMPRESSION: Mechanical thrombectomy performed for treatment of a left M3/MCA posterior division branch occlusion achieving adequate recanalization (TICI 2b). Persistent occlusion of a posterior parietal cortical branch noted. No hemorrhagic complication. PLAN: Transferred to ICU for continued post stroke care. Electronically Signed   By: Pedro Earls M.D.   On: 03/25/2022 17:18   IR CT Head Ltd  Result Date: 03/25/2022 INDICATION: 87 year old female past medical history significant for anxiety, arthritis, CAD, GERD, heart murmur, hemorrhoids, HLD, HTN, afib on eliquis, TAVR, and takotsubo cardiomyopathy; baseline modified Rankin scale 1. She presented with aphasia; NIHSS 4. Her last known well was 9:30 p.m. on 03/24/2022. Head CT showed no large acute territorial infarct or hemorrhage. CT angiogram of the head and neck showed an occlusion of a left M3 branch with a 12 mL ischemic penumbra on CT perfusion. She was transferred to our service for emergency mechanical thrombectomy. EXAM: ULTRASOUND-GUIDED VASCULAR ACCESS DIAGNOSTIC CEREBRAL ANGIOGRAM MECHANICAL FLAT PANEL HEAD CT COMPARISON:  CT/CT angiogram of the head and neck March 25, 2022. MEDICATIONS: No antibiotics administered. ANESTHESIA/SEDATION: The procedure was performed under general anesthesia. CONTRAST:  75 mL of Omnipaque 300 milligram/mL FLUOROSCOPY: Radiation Exposure Index (as provided by the fluoroscopic device): 3762 mGy Kerma COMPLICATIONS: None immediate. TECHNIQUE: Informed written consent was obtained from the patient's daughter after a thorough discussion of the procedural risks, benefits and alternatives. All questions were addressed. Maximal Sterile Barrier Technique was utilized including caps, mask, sterile gowns, sterile gloves,  sterile drape, hand hygiene and skin antiseptic. A timeout was performed prior to the initiation of the procedure. The right groin was prepped and draped in the usual sterile fashion. Using a micropuncture kit and the modified Seldinger technique, access was gained to the right common femoral artery and an 8 French sheath was placed. Real-time ultrasound guidance was utilized for vascular access including the acquisition of a permanent ultrasound image documenting patency of the accessed vessel. Under fluoroscopy, a Zoom 88 guide catheter was navigated over a 6 [place]  VTK catheter and a 0.035" Terumo Glidewire into the aortic arch. The catheter was placed into the left common carotid artery and then advanced into the left internal carotid artery. The diagnostic catheter was removed. Frontal and lateral angiograms of the head were obtained. FINDINGS: 1. Atherosclerotic changes of the right common femoral artery with normal caliber, adequate for vascular access. 2. There is an occlusion of a left M3/MCA posterior division branch with wedge defects noted in the posterior parietal area. PROCEDURE: Using biplane roadmap, a Zoom 35 aspiration catheter was navigated over an Aristotle 24 microguidewire into the cavernous segment of the left ICA. The aspiration catheter was then advanced to the level of occlusion in the left M3 segment and connected to an aspiration pump. Continuous aspiration was performed for 2 minutes. The guide catheter was connected to a VacLok syringe. The aspiration catheter was subsequently removed under constant aspiration. The guide catheter was aspirated for debris. Left internal carotid artery angiogram with magnified frontal lateral views of the head showed recanalization of the left M3 branch with mild vasospasm. A small wedge defect is noted in the posterior parietal region. Follow-up angiogram showed worsening of vasospasm with filling defect in the proximal aspect of the M3 branch. Using  biplane roadmap guidance, a phenom 21 microcatheter and a Aristotle 14 microguidewire into the cavernous segment of the left ICA. The microcatheter was then navigated over the wire into the left M3/MCA branch. Then, 5 mg of verapamil were infused within the M3 branch. Follow-up angiogram showed worsening of the vasospasm with associated filling defect. Using biplane roadmap guidance, a phenom 21 microcatheter and a Aristotle 14 microguidewire into the cavernous segment of the left ICA. The microcatheter was then navigated over the wire into the left M3 segment. Then, a 3 mm solitaire stent retriever was deployed spanning the M3 segment. The device was allowed to intercalated with the clot for 4 minutes. The thrombectomy device was then removed. Follow-up angiograms with magnified frontal and lateral views of the head showed complete recanalization of that M3 branch with persistent small wedge defect in the posterior parietal region. Arterial opacification via collateral flow is noted in the venous phase at the region of the arterial phase wedge defect, consistent with retrograde arterial filling via collaterals. Multiple magnified angiograms were obtained with different projections. However origin of the branch was not identified. The mild early draining vein noted in the region of the wedge defect, suggesting ongoing ischemia. Flat panel CT of the head was obtained and post processed in a separate workstation with concurrent attending physician supervision. Selected images were sent to PACS. No evidence of hemorrhagic transformation. The catheter was subsequently withdrawn. Right common femoral artery angiogram was obtained in right anterior oblique view. The puncture is at the level of the common femoral artery. The artery has normal caliber, adequate for closure device. The sheath was exchanged over the wire for an 8 [place] Angio-Seal which was utilized for access closure. Immediate hemostasis was achieved.  IMPRESSION: Mechanical thrombectomy performed for treatment of a left M3/MCA posterior division branch occlusion achieving adequate recanalization (TICI 2b). Persistent occlusion of a posterior parietal cortical branch noted. No hemorrhagic complication. PLAN: Transferred to ICU for continued post stroke care. Electronically Signed   By: Pedro Earls M.D.   On: 03/25/2022 17:18   CT ANGIO HEAD NECK W WO CM W PERF (CODE STROKE)  Result Date: 03/25/2022 CLINICAL DATA:  Provided history: Neuro deficit, acute, stroke suspected. EXAM: CT ANGIOGRAPHY HEAD AND NECK  CT PERFUSION BRAIN TECHNIQUE: Multidetector CT imaging of the head and neck was performed using the standard protocol during bolus administration of intravenous contrast. Multiplanar CT image reconstructions and MIPs were obtained to evaluate the vascular anatomy. Carotid stenosis measurements (when applicable) are obtained utilizing NASCET criteria, using the distal internal carotid diameter as the denominator. Multiphase CT imaging of the brain was performed following IV bolus contrast injection. Subsequent parametric perfusion maps were calculated using RAPID software. RADIATION DOSE REDUCTION: This exam was performed according to the departmental dose-optimization program which includes automated exposure control, adjustment of the mA and/or kV according to patient size and/or use of iterative reconstruction technique. CONTRAST:  166m OMNIPAQUE IOHEXOL 350 MG/ML SOLN COMPARISON:  Noncontrast head CT performed earlier today 03/25/2022. FINDINGS: CTA NECK FINDINGS Aortic arch: Standard aortic branching. Atherosclerotic plaque within the visualized aortic arch and proximal major branch vessels of the neck. No hemodynamically significant innominate or proximal subclavian artery stenosis. Right carotid system: CCA and ICA patent within the neck. Prominent calcified plaque about the carotid bifurcation and within the proximal ICA. Severe  stenosis at the origin of the ICA (estimated to be 80-90%). Left carotid system: CCA and ICA patent within the neck without hemodynamically significant stenosis (50% or greater). Atherosclerotic plaque scattered within the CCA, about the carotid bifurcation and within the proximal ICA. Vertebral arteries: The non dominant right vertebral artery is developmentally diminutive, but patent throughout the neck. The dominant left vertebral artery is patent within the neck. Mild atherosclerotic narrowing at the origin of this vessel. Skeleton: Grade 1 anterolisthesis at C2-C3, C3-C4, C4-C5. Grade 1 retrolisthesis at C5-C6. Grade 1 anterolisthesis at C6-C7 and C7-T1. Cervical spondylosis. Multilevel spinal canal narrowing. Most notably at C5-C6, a posterior disc osteophyte complex contributes to apparent moderate spinal canal stenosis. Multilevel bony neural foraminal narrowing. Disc degeneration is advanced at C4-C5, C5-C6 and C6-C7. Other neck: No neck mass or cervical lymphadenopathy. Upper chest: Bronchiectasis and scattered tree-in-bud opacities within the bilateral upper lobes. The tree-in-bud opacities likely reflect sequelae of chronic infection. Biapical pleuroparenchymal scarring. Review of the MIP images confirms the above findings CTA HEAD FINDINGS Anterior circulation: The intracranial internal carotid arteries are patent. Nonstenotic calcified plaque within both vessels. The M1 middle cerebral arteries are patent. Abrupt occlusion of a proximal to mid M2 left MCA vessel (series 14, image 27) (series 9, image 91). The anterior cerebral arteries are patent. No intracranial aneurysm is identified. Posterior circulation: Diminutive intracranial right vertebral artery with superimposed high-grade atherosclerotic stenosis distally. The dominant intracranial left vertebral artery is patent without stenosis. The basilar artery is patent. The posterior cerebral arteries are patent. Atherosclerotic irregularity of both  vessels without high-grade proximal stenosis. The posterior cerebral arteries are fetal in origin, bilaterally. Venous sinuses: Within the limitations of contrast timing, no convincing thrombus. Anatomic variants: As described. Review of the MIP images confirms the above findings CT Brain Perfusion Findings: CBF (<30%) Volume: 022mPerfusion (Tmax>6.0s) volume: 1216mleft MCA vascular territory) Mismatch Volume: 36m40mfarction Location:None identified CTA head impression #1 called by telephone at the time of interpretation on 03/25/2022 at 12:15 pm to provider Dr. KirkLeonel Ramsayo verbally acknowledged these results. IMPRESSION: CTA neck: 1. The common carotid and internal carotid arteries are patent within the neck. Atherosclerotic plaque, bilaterally. Most notably, there is a severe (estimated 80-90% stenosis) at the origin of the right ICA. 2. Vertebral arteries patent within the neck. Mild atherosclerotic narrowing at the origin of the dominant left vertebral artery. 3.  Aortic Atherosclerosis (ICD10-I70.0).  4. Bronchiectasis and scattered tree-in-bud pulmonary opacities within the bilateral upper lobes. The tree-in-bud opacities likely reflect sequelae of chronic infection. 5. Cervical spondylosis. CTA head: 1. Occluded proximal-to-mid M2 left MCA vessel. 2. Additional intracranial atherosclerotic disease, as described. Most notably, there is a severe stenosis within the distal V4 right vertebral artery (the non-dominant vertebral artery). CT perfusion head: The perfusion software identifies a 12 mL region of critically hypoperfused parenchyma within the left MCA vascular territory. No core infarct is identified. Reported mismatch volume: 12 mL. Electronically Signed   By: Kellie Simmering D.O.   On: 03/25/2022 12:19   CT HEAD CODE STROKE WO CONTRAST  Result Date: 03/25/2022 CLINICAL DATA:  Code stroke. Neuro deficit, acute, stroke suspected. EXAM: CT HEAD WITHOUT CONTRAST TECHNIQUE: Contiguous axial images were  obtained from the base of the skull through the vertex without intravenous contrast. RADIATION DOSE REDUCTION: This exam was performed according to the departmental dose-optimization program which includes automated exposure control, adjustment of the mA and/or kV according to patient size and/or use of iterative reconstruction technique. COMPARISON:  Head CT 12/27/2020. FINDINGS: Brain: Mild generalized parenchymal atrophy. Redemonstrated small chronic infarct within the right cerebellar hemisphere (series 2, image 10). Tiny infarct within the left cerebellar hemisphere, new from the prior head CT of 12/27/2020 but otherwise age-indeterminate (series 5, image 51) (series 6, image 42). There is no acute intracranial hemorrhage. No demarcated cortical infarct. No extra-axial fluid collection. No evidence of an intracranial mass. No midline shift. Vascular: Atherosclerotic calcifications. Calcific focus within the left sylvian fissure, new from prior exams and concerning for a calcified embolus within an M2 left MCA vessel (series 5, image 31). Skull: No fracture or aggressive osseous lesion. Sinuses/Orbits: No mass or acute finding within the imaged orbits. Mild mucosal thickening within the right maxillary sinus at the imaged levels. Small to moderate-volume secretions within the left maxillary sinus at the imaged levels. Small-volume secretions within the right sphenoid sinus. Minimal mucosal thickening within the anterior ethmoid air cells, bilaterally. ASPECTS [facility] Stroke Program Early CT Score) - Ganglionic level infarction (caudate, lentiform nuclei, internal capsule, insula, M1-M3 cortex): 7 - Supraganglionic infarction (M4-M6 cortex): 3 Total score (0-10 with 10 being normal): 10 Pertinent results were communicated to Dr. Leonie Man At 11:23 amon 9/1/2023by text page via the [facility] messaging system. IMPRESSION: 1. No acute intracranial hemorrhage or acute demarcated cortical infarct. 2. Suspected calcified  embolus within a left M2 MCA vessel, as described. Correlate with findings on pending CTA head/neck. 3. Tiny infarct within the left cerebellar hemisphere, new from the prior head CT of 12/27/2020 but otherwise age-indeterminate. 4. 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 5. Mild generalized parenchymal atrophy. 6. Paranasal sinus disease at the imaged levels, as described. Electronically Signed   By: Kellie Simmering D.O.   On: 03/25/2022 11:27      HISTORY OF PRESENT ILLNESS Keimani Laufer is a 87 y.o. female with a past medical history anxiety, arthritis, CAD, GERD, heart murmur, hemorrhoids, HLD, HTN, afib on eliquis, TAVR, and takotsubo cardiomyopathy.  She spoke with her daughter on the phone last night at 2130 and was in her usual state of health. This morning when she spoke with her daughter she noted word finding difficulties and increased confusion. Ms Ahmed is independent and is able to complete all ADLs independently.  She had a TAVR done on 08/03/2021 and after discharge she did have a Zio patch that confirmed Afib. She has been on 2.51m of Eliquis BID  since that time. Her last dose of eliquis was 03/24/2022 and she has not taken any of her medications today.    LKW: 2130 tpa given?: no, outside of window, on elquis Premorbid modified Rankin scale (mRS): 0 IR: yes, M3 occlusion discussed with Dr. Karenann Cai and family, decision was made to proceed with an emergent mechanical thrombectomy after discussion of risk benefits and alternatives.  NIHSS 4  premorbid MRS [place] COURSE Ms. Salisha Bardsley is a 87 y.o. 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 No tPA given due to on Eliquis and being outside window.     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 Ongoing aggressive stroke risk factor management Therapy recommendations: SNF Disposition: SNF today   PAF Now on NSR on tele Eliquis 2.5 twice daily at home, states to have been  compliant Likely failed Eliquis this time Aspirin switch to Pradaxa 150 bid   Carotid stenosis Right ICA 80 to 90% stenosis on CTA Known chronic right ICA stenosis by PCP per daughter Follow-up as outpatient with PCP   Hypertension Stable BP goal < 180/105 Long term BP goal normotensive   Hyperlipidemia Home meds: Crestor 5 LDL 103, goal < 70 Now on Crestor 20 mg po qhs Continue statin at discharge   Other Stroke Risk Factors Advanced age Coronary artery disease Status post TAVR 07/2021 Takotsubo cardiomyopathy   Other Active Problems none   DISCHARGE EXAM Blood pressure (P) 134/66, pulse (P) 75, temperature (P) 98.7 F (37.1 C), temperature source (P) Oral, resp. rate (P) 18, weight 53.3 kg, SpO2 (P) 97 %.  General - Well nourished, well developed, in no apparent distress.   Cardiovascular - Regular rhythm and rate, not in afib today.   Neuro - awake, alert, eyes open, mild expressive aphasia with word finding difficulty, anomia and able to repeat first half a sentence.  Also has partial receptive aphasia, not able to follow two step commands. No gaze palsy, tracking bilaterally, visual field full ,No facial droop. Tongue midline. Bilateral UEs 5/5, no drift. Bilaterally LEs 5/5, no drift. Sensation symmetrical bilaterally subjectively, L FTN intact, R  FTN ataxic, gait not tested.   Discharge Diet       Diet   Diet Heart Room service appropriate? Yes with Assist; Fluid consistency: Thin   liquids  DISCHARGE PLAN Disposition:  SNF Pradaxa (dabigatran) twice a day for secondary stroke prevention. Ongoing stroke risk factor control by Primary Care Physician at time of discharge Follow-up PCP MWardell Honour MD in 2 weeks. [facility] in 4 weeks, office to schedule an appointment.   35 minutes were spent preparing discharge.  JRosalin Hawking MD PhD Stroke Neurology 03/30/2022 2:09 PM

## 2022-03-30 NOTE — Progress Notes (Signed)
Physical Therapy Treatment Patient Details Name: Barbara Mcconnell MRN: [id number] DOB: 04-14-1935 Today's Date: 03/30/2022   History of Present Illness Pt is an 87 y.o. female who presented 03/25/22 with AMS and aphasia. Outside window for tPA. S/p mechanical thrombectomy 9/1. MRI revealed left MCA territory ischemia, additional tiny scattered cortical infarcts at the left MCA/PCA watershed, solitary lesion in the right superior frontal gyrus, and small chronic right cerebellar infarcts. 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 VSS on RA. Pt with difficulty wayfinding and notable expressive and apparent receptive aphasia, needing consistent minA for gait tasks without AD. Pt continues to benefit from PT services to progress toward functional mobility goals.    Recommendations for follow up therapy are one component of a multi-disciplinary discharge planning process, led by the attending physician.  Recommendations may be updated based on patient status, additional functional criteria and insurance authorization.  Follow Up Recommendations  Skilled nursing-short term rehab (<3 hours/day) Can patient physically be transported by private vehicle: Yes   Assistance Recommended at Discharge Frequent or constant Supervision/Assistance  Patient can return home with the following A little help with walking and/or transfers;A lot of help with bathing/dressing/bathroom;Assistance with cooking/housework;Direct supervision/assist for medications management;Direct supervision/assist for financial management;Assist for transportation   Equipment Recommendations  Rolling walker (2 wheels)    Recommendations for Other  Services       Precautions / Restrictions Precautions Precautions: Fall;Other (comment) Precaution Comments: BP goal <180/105 Restrictions Weight Bearing Restrictions: No     Mobility  Bed Mobility               General bed mobility comments: in chair on arrival and departure, pt vociferously defers a return to the bed.    Transfers Overall transfer level: Needs assistance Equipment used: Rolling walker (2 wheels) Transfers: Sit to/from Stand Sit to Stand: Min guard           General transfer comment: min guard A for safety. Intermittent tactile cues for hand placement or problem solving with use of RW    Ambulation/Gait Ambulation/Gait assistance: Min assist, Min guard Gait Distance (Feet): 75 Feet (x4 with breaks between) Assistive device: Rolling walker (2 wheels), None Gait Pattern/deviations: Step-through pattern, Decreased stride length, Decreased dorsiflexion - right, Ataxic, Narrow base of support, Staggering right       General Gait Details: mildly ataxic gait and at times staggering to Rt, stopping numerous times due to LOB with delayed righting reflexes. Poor tolerance for balance challenges, especially with stepping over objects, turning or walking backwards. Pt steadier with RW needing only min guard but drifting to L/R sides at times and seeming to have increased difficulty steering device when turning. Pt holds RW outside BOS with limited carryover of cues for proximity to device. See cognition for wayfinding.   Stairs Stairs: Yes Stairs assistance: Min assist, Min guard Stair Management: Two rails, One rail Right, Step to pattern, Alternating pattern, Forwards Number of Stairs: 5 (PT steps in gym x2 trials) General stair comments: minG with use of bilateral rails, minA with single rail at times when attempting reciprocal pattern, pt steadier with step-to pattern but at times ignoring cues for technique.   Wheelchair Mobility    Modified Rankin  (  Stroke Patients Only) Modified Rankin (Stroke Patients Only) Pre-Morbid Rankin Score: No symptoms Modified Rankin: Moderately severe disability     Balance Overall balance assessment: Needs assistance Sitting-balance support: No upper extremity supported, Feet supported Sitting balance-Leahy Scale: Good     Standing balance support: No upper extremity supported, Bilateral upper extremity supported Standing balance-Leahy Scale: Poor Standing balance comment: min guard A for safety for static tasks, up to minA for dynamic tasks unsupported, fair balance using RW for static standing                     Dynamic Gait Index Level Surface: Mild Impairment Gait and Pivot Turn: Moderate Impairment Step Around Obstacles: Mild Impairment Steps: Moderate Impairment      Cognition Arousal/Alertness: Awake/alert Behavior During Therapy: WFL for tasks assessed/performed Overall Cognitive Status: Difficult to assess Area of Impairment: Attention, Following commands, Safety/judgement, Problem solving, Orientation, Memory                 Orientation Level: Place, Disoriented to, Time Current Attention Level: Sustained, Selective Memory: Decreased short-term memory Following Commands: Follows one step commands with increased time, Follows one step commands inconsistently, Follows multi-step commands inconsistently Safety/Judgement: Decreased awareness of safety, Decreased awareness of deficits Awareness: Emergent Problem Solving: Slow processing, Difficulty sequencing, Requires verbal cues, Requires tactile cues General Comments: Pt with likely receptive and expressive aphasia and frequent difficulty with word finding, also difficulty wayfinding when prompted to find "PT gym" or "room 32/room 11" with poor ability to interpret navigational signs in hallway. Pt looking at room 32 sign and states "31". Pt participatory but frequently expressing frustration at seeing patients lying in  bed, stating "it isn't right!".           General Comments General comments (skin integrity, edema, etc.): BP 134/66 taken reclined post-exertion (within goal), HR 77-100 bpm      Pertinent Vitals/Pain Pain Assessment Pain Assessment: No/denies pain Pain Intervention(s): Monitored during session, Repositioned           PT Goals (current goals can now be found in the care plan section) Acute Rehab PT Goals Patient Stated Goal: "to get out of here" PT Goal Formulation: With patient/family Time For Goal Achievement: 04/09/22 Progress towards PT goals: Progressing toward goals    Frequency    Min 3X/week      PT Plan Current plan remains appropriate       AM-PAC PT "6 Clicks" Mobility   Outcome Measure  Help needed turning from your back to your side while in a flat bed without using bedrails?: A Little Help needed moving from lying on your back to sitting on the side of a flat bed without using bedrails?: A Little Help needed moving to and from a bed to a chair (including a wheelchair)?: A Little Help needed standing up from a chair using your arms (e.g., wheelchair or bedside chair)?: A Little Help needed to walk in hospital room?: A Lot Help needed climbing 3-5 steps with a railing? : A Lot 6 Click Score: 16    End of Session Equipment Utilized During Treatment: Gait belt Activity Tolerance: Patient tolerated treatment well Patient left: in chair;with call bell/phone within reach;with chair alarm set Nurse Communication: Mobility status PT Visit Diagnosis: Unsteadiness on feet (R26.81);Other abnormalities of gait and mobility (R26.89);Muscle weakness (generalized) (M62.81);Difficulty in walking, not elsewhere classified (R26.2);Ataxic gait (R26.0);Other symptoms and signs involving the nervous system (Z76.734)     Time: 1937-9024 PT Time Calculation (min) (  ACUTE ONLY): 20 min  Charges:  $Gait Training: 8-22 mins                     Shalik Sanfilippo P., PTA Acute  Rehabilitation Services Secure Chat Preferred 9a-5:30pm Office: [phone]    Dorathy Kinsman [facility] 03/30/2022, 3:43 PM

## 2022-03-30 NOTE — Telephone Encounter (Signed)
Patient's daughter called stating that mother had stroke on Friday and she is wanting to know if our office can coordinate care and provide additional support for her. She states that patient is requiring the need for more specialist. She wanted to know if we had a care coordinator or patient navigator.  I informed her they we do not have that as part of our staff. She had some additional questions,but her telephone was breaking up and she will be calling back as we both were unable to hear each other.

## 2022-03-30 NOTE — Progress Notes (Signed)
Occupational Therapy Treatment Patient Details Name: Barbara Mcconnell MRN: [id number] DOB: 1935/02/20 Today's Date: 03/30/2022   History of present illness Pt is an 87 y.o. female who presented 03/25/22 with AMS and aphasia. Outside window for tPA. S/p mechanical thrombectomy 9/1. MRI revealed left MCA territory ischemia, additional tiny scattered cortical infarcts at the left MCA/PCA watershed, solitary lesion in the right superior frontal gyrus, and small chronic right cerebellar infarcts. 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 Performing 3 step path finding with max verbal cues and min guard A for safety. Min cues for RW placement. Pt performing RUE exercises of grasp/release, composite flexion/extension and 3 point pinch with therapist facilitation to hold 4th and 5th digits close to hand while pt attempting to perform. Pt continues to present with decreased balance, strength, coordination, communication, and cognition. Continue to recommend SNF for OT to optimize safety and independence with ADL and IADL.    Recommendations for follow up therapy are one component of a multi-disciplinary discharge planning process, led by the attending physician.  Recommendations may be updated based on patient status, additional functional criteria and insurance authorization.    Follow Up Recommendations  Skilled nursing-short term rehab (<3 hours/day)    Assistance Recommended at Discharge Frequent or constant Supervision/Assistance  Patient can return home with the following  A little help with walking and/or transfers;Direct supervision/assist for medications management;Direct supervision/assist for financial management;Assist for transportation;Assistance with cooking/housework;Help with stairs or ramp for entrance;A little help with bathing/dressing/bathroom   Equipment Recommendations  Other  (comment) (defer to next venue)    Recommendations for Other Services Rehab consult    Precautions / Restrictions Precautions Precautions: Fall;Other (comment) Precaution Comments: BP goal <180/105 Restrictions Weight Bearing Restrictions: No       Mobility Bed Mobility               General bed mobility comments: in chair on arrival and departure    Transfers Overall transfer level: Needs assistance Equipment used: Rolling walker (2 wheels) Transfers: Sit to/from Stand Sit to Stand: Min guard           General transfer comment: min guard A for safety. Intermittent tactile cues for hand placement or problem solving with use of RW     Balance Overall balance assessment: Needs assistance Sitting-balance support: No upper extremity supported, Feet supported Sitting balance-Leahy Scale: Good     Standing balance support: No upper extremity supported, Bilateral upper extremity supported Standing balance-Leahy Scale: Poor Standing balance comment: min guard A for safety for static tasks, up to minA for dynamic tasks unsupported, fair balance using RW for static standing                           ADL either performed or assessed with clinical judgement   ADL Overall ADL's : Needs assistance/impaired                         Toilet Transfer: Min guard;Regular Toilet;Rolling walker (2 wheels) Toilet Transfer Details (indicate cue type and reason): Simulated in room.         Functional mobility during ADLs: Min guard;Rolling walker (2 wheels) General ADL Comments: decreased attention, expressive abilities, attention, problem solving, memory, and safety awareness.    Extremity/Trunk Assessment Upper Extremity Assessment Upper Extremity Assessment: RUE deficits/detail RUE Deficits / Details:  difficulty with coordination   Lower Extremity Assessment Lower Extremity Assessment: Defer to PT evaluation        Vision   Vision Assessment?:  Vision impaired- to be further tested in functional context Additional Comments: Pt requiring multimodal cues for way finding and occasional cues to avoid bumping into items in hallway   Perception Perception Perception: Impaired   Praxis Praxis Praxis: Impaired Praxis Impairment Details: Ideomotor;Motor planning    Cognition Arousal/Alertness: Awake/alert Behavior During Therapy: WFL for tasks assessed/performed Overall Cognitive Status: Difficult to assess Area of Impairment: Attention, Following commands, Safety/judgement, Problem solving, Orientation, Memory                 Orientation Level: Place, Disoriented to, Time Current Attention Level: Sustained, Selective Memory: Decreased short-term memory Following Commands: Follows one step commands with increased time, Follows one step commands inconsistently, Follows multi-step commands inconsistently Safety/Judgement: Decreased awareness of safety, Decreased awareness of deficits Awareness: Emergent Problem Solving: Slow processing, Difficulty sequencing, Requires verbal cues, Requires tactile cues General Comments: Pt with receptive and expressive aphasia and frequent difficulty with word finding. Pt with decreased insight into current abilities. Not oriented. Initially stating "I just had therapy" and then ~2 mins later stating "I havent even walked today". Pt also with inconsistent awareness of discharge today. Pt following commands with up to mod cues and with decresaed safety awareness, trying to crawl out of chair with foot rest up.        Exercises Exercises: Other exercises Other Exercises Other Exercises: Pt performing gross grasp to retrieve items from therapist 10x Other Exercises: Pt performing composite flexion/extension of digits.    Shoulder Instructions       General Comments VSS    Pertinent Vitals/ Pain       Pain Assessment Pain Assessment: No/denies pain Pain Intervention(s): Monitored during  session  Home Living                                          Prior Functioning/Environment              Frequency  Min 2X/week        Progress Toward Goals  OT Goals(current goals can now be found in the care plan section)  Progress towards OT goals: Progressing toward goals  Acute Rehab OT Goals Patient Stated Goal: Watch Jeopardy OT Goal Formulation: With patient Time For Goal Achievement: 04/08/22 Potential to Achieve Goals: Good ADL Goals Pt Will Perform Grooming: with supervision;standing Pt Will Perform Upper Body Dressing: with supervision;sitting Pt Will Perform Lower Body Dressing: with supervision;sit to/from stand Pt Will Transfer to Toilet: with supervision;ambulating;regular height toilet  Plan Discharge plan remains appropriate    Co-evaluation                 AM-PAC OT "6 Clicks" Daily Activity     Outcome Measure   Help from another person eating meals?: A Little Help from another person taking care of personal grooming?: A Little Help from another person toileting, which includes using toliet, bedpan, or urinal?: A Little Help from another person bathing (including washing, rinsing, drying)?: A Little Help from another person to put on and taking off regular upper body clothing?: A Little Help from another person to put on and taking off regular lower body clothing?: A Little 6 Click Score: 18    End of Session Equipment Utilized During  Treatment: Gait belt;Rolling walker (2 wheels)  OT Visit Diagnosis: Unsteadiness on feet (R26.81);Ataxia, unspecified (R27.0);Other symptoms and signs involving cognitive function   Activity Tolerance Patient tolerated treatment well   Patient Left in chair;with call bell/phone within reach;with chair alarm set   Nurse Communication Mobility status        Time: 2952-8413 OT Time Calculation (min): 19 min  Charges: OT General Charges $OT Visit: 1 Visit OT  Treatments $Therapeutic Activity: 8-22 mins  Ladene Artist, OTR/L [facility] Acute Rehabilitation Office: [phone]   Drue Novel 03/30/2022, 4:36 PM

## 2022-03-30 NOTE — TOC Transition Note (Signed)
Transition of Care [facility]) - CM/SW Discharge Note   Patient Details  Name: Barbara Mcconnell MRN: [id number] Date of Birth: [birth year]  [facility]) CM/SW Contact:  Geralynn Ochs, LCSW Phone Number: 03/30/2022, 3:38 PM   Clinical Narrative:   CSW received insurance auth for patient to admit to SNF, and met with patient and daughter at bedside to discuss transition to SNF today. Lengthy discussion with CSW answering questions and discussing expectations for duration of SNF stay, explaining that it's pending on insurance coverage. Daughter to provide transport to SNF after work.   Nurse to call report to [phone], Room 404A.    Final next level of care: Skilled Nursing Facility Barriers to Discharge: Barriers Resolved   Patient Goals and CMS Choice Patient states their goals for this hospitalization and ongoing recovery are:: Rehab to get back to baseline and cognitive improvements CMS Medicare.gov Compare Post Acute Care list provided to:: Patient Choice offered to / list presented to : Patient, Adult Children  Discharge Placement              Patient chooses bed at: [facility] Patient to be transferred to facility by: Family Name of family member notified: [facility] Patient and family notified of of transfer: 03/30/22  Discharge Plan and Services In-house Referral: Clinical Social Work   Post Acute Care Choice: [place]                               Social Determinants of Health (SDOH) Interventions     Readmission Risk Interventions    08/04/2021    3:35 PM  Readmission Risk Prevention Plan  Transportation Screening Complete  PCP or Specialist Appt within 5-7 Days Complete  Home Care Screening Complete  Medication Review (RN CM) Complete

## 2022-03-31 NOTE — Anesthesia Postprocedure Evaluation (Signed)
Anesthesia Post Note  Patient: Barbara Mcconnell  Procedure(s) Performed: IR WITH ANESTHESIA     Patient location during evaluation: PACU Anesthesia Type: General Level of consciousness: awake and patient cooperative Pain management: pain level controlled Vital Signs Assessment: post-procedure vital signs reviewed and stable Respiratory status: spontaneous breathing, nonlabored ventilation, respiratory function stable and patient connected to nasal cannula oxygen Cardiovascular status: blood pressure returned to baseline and stable Postop Assessment: no apparent nausea or vomiting Anesthetic complications: no   No notable events documented.          Tyreon Frigon

## 2022-04-08 NOTE — Progress Notes (Unsigned)
Cardiology Office Note:    Date:  04/12/2022   ID:  Barbara Mcconnell, DOB 1934/11/12, MRN [id number]  PCP:  Wardell Honour, MD  Cardiologist:  Makenzee Choudhry Martinique, MD  Electrophysiologist:  None   Referring MD: Wardell Honour, MD   Chief Complaint  Patient presents with   Cerebrovascular Accident    History of Present Illness:    Barbara Mcconnell is a 87 y.o. female is seen for follow up aortic stenosis s/p TAVR and Takotsubo cardiomyopathy.  She had a cardiac event approximately 7 years ago in [facility], we have no records of that. According to the patient this was Takotsubos cardiomyopathy.   Other medical issues include hypertension, peripheral vascular disease with moderate carotid stenosis, and dyslipidemia with a history of statin intolerance.    The patient presented to the med center in [facility] 05/13/2019 with substernal chest pain and diaphoresis.  She ruled in for an MI.  Admitted to [facility] hospital. Catheterization was done which showed a 50% RCA otherwise normal coronaries and an ejection fraction of 30 to 35% with moderate AS. LV pattern was c/w Takotsubo's disease.  In the hospital she was somewhat hypotensive but this improved prior to discharge and she was put back on her lisinopril but at a lower dose. She is also on low dose Toprol.  On repeat Echo in March 2021 her EF had normalized but there was moderate to severe Aortic stenosis and repeat in 6 months was recommended. Unfortunately she did not have follow up until she presented to [facility] 07/21/21-07/30/21 for acute dyspnea. Echo showed normal LVEF with a moderate circumfrential pericardial effusion measuring 1.5 cm. There was moderate AS with a mean gradient at 38mmHg (was 20.53mmhg), AVA 1.38cm and DI 0.29. She underwent [facility] 07/23/21 which showed stable, mild to moderate CAD with hemodynamics consistent with pulmonary hypertension. Due to moderate AS, she was initially felt to be a good PROGRESS trial candidate however with  discrepancies between echocardiogram findings and inability to cross AV, pre-TAVR CT imaging was obtained. These showed an AVA 1.01cm2, annular area measuring 439mm2, and large circumferential pericardial effusion. After further discussion, she opted to defer PROGRESS trail consideration and move forward with TAVR given progressive symptoms, LVH with small LV cavity and calcium score at 2093. Her hospital stay was further complicated by new onset atrial fibrillation with RVR. She was placed on diltiazem infusion with relatively quick conversion to NSR. She ultimately underwent pericardiocentesis 07/28/21 and drain placement. This was discontinued 07/29/21 and the patient was discharged from the hospital 07/30/21. During her admission, she was evaluated by our multidisciplinary valve team and plans were made for TAVR on 08/03/21.    She underwent successful TAVR with a 23 mm Edwards Sapien 3 Resilia via the TF approach on 08/03/21. Post operative echo showed LVEF at 65-70% with G1DD, mild MR, mild MS, trivial PVL in the right coronary ostium with mild MR with a mean gradient at 10.26mmHg. ECG with NSR and frequent PVCs however no high grade heart block. She was initially started on ASA 81mg  QD however this was changed to Eliquis 2.5mg  BID after discharge due to AF on ZIO monitor.  She was seen in the valve clinic in February and doing well. Echo follow up as noted below. Later she developed orthostatic symptoms and low BP so ACEi was discontinued. This was later resumed at 5 mg daily when BP went up.   She was admitted 9/1-03/30/22 with acute left MCA stroke. She had difficulty with  word finding and confusion. She was on Eliquis. Had IR intervention of M2 segment with apparent reocclusion of M3 segment. Echo was stable. CT showed severe stenosis at the origin of the RICA. No significant stenosis on the left. (Prior dopplers in Feb showed 50-69% RICA stenosis). She was switched from Eliquis to Pradaxa. DC to SNF.  BP at  [facility] has been excellent 118-130 systolic While in hospital no Afib seen. Looking at options for longer term care.   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 Lynnette Caffey & Dr. Laneta Simmers   Seasonal allergies    Takotsubo cardiomyopathy     Past Surgical History:  Procedure Laterality Date   COLONOSCOPY     EYE SURGERY Bilateral    cataracts removed   INTRAOPERATIVE TRANSTHORACIC ECHOCARDIOGRAM N/A 08/03/2021   Procedure: INTRAOPERATIVE TRANSTHORACIC ECHOCARDIOGRAM;  Surgeon: Orbie Pyo, MD;  Location: [facility] INVASIVE CV LAB;  Service: Open Heart Surgery;  Laterality: N/A;   IR CT HEAD LTD  03/25/2022   IR PERCUTANEOUS ART THROMBECTOMY/INFUSION INTRACRANIAL INC DIAG ANGIO  03/25/2022   IR US GUIDE VASC ACCESS RIGHT  03/25/2022   PERICARDIOCENTESIS N/A 07/28/2021   Procedure: PERICARDIOCENTESIS;  Surgeon: Orbie Pyo, MD;  Location: [facility] INVASIVE CV LAB;  Service: Cardiovascular;  Laterality: N/A;   RADIOLOGY WITH ANESTHESIA N/A 03/25/2022   Procedure: IR WITH ANESTHESIA;  Surgeon: Radiologist, Medication, MD;  Location: [facility] OR;  Service: Radiology;  Laterality: N/A;   RIGHT/LEFT HEART CATH AND CORONARY ANGIOGRAPHY N/A 05/13/2019   Procedure: RIGHT/LEFT HEART CATH AND CORONARY ANGIOGRAPHY;  Surgeon: [place], Caleb Decock M, MD;  Location: [facility] INVASIVE CV LAB;  Service: Cardiovascular;  Laterality: N/A;   RIGHT/LEFT HEART CATH AND CORONARY ANGIOGRAPHY N/A 07/23/2021   Procedure: RIGHT/LEFT HEART CATH AND CORONARY ANGIOGRAPHY;  Surgeon: Corky Crafts, MD;  Location: [facility] INVASIVE CV LAB;  Service: Cardiovascular;  Laterality: N/A;   TONSILLECTOMY     TRANSCATHETER AORTIC VALVE REPLACEMENT, TRANSFEMORAL N/A 08/03/2021   Procedure: TRANSCATHETER AORTIC VALVE REPLACEMENT, TRANSFEMORAL;  Surgeon: Orbie Pyo, MD;  Location: [facility] INVASIVE CV LAB;  Service: Open Heart Surgery;  Laterality: N/A;   UPPER GI ENDOSCOPY      Current Medications: Current Meds  Medication Sig   Cholecalciferol (VITAMIN D3 PO) Take 1,000 Units by mouth daily.   dabigatran (PRADAXA) 150 MG CAPS capsule Take 1 capsule (150 mg total) by mouth every 12 (twelve) hours.   escitalopram (LEXAPRO) 5 MG tablet Take 1 tablet (5 mg total) by mouth daily.   fexofenadine (ALLEGRA) 60 MG tablet Take 60 mg by mouth daily.   furosemide (LASIX) 20 MG tablet Take 0.5 tablets (10 mg total) by mouth 2 (two) times daily.   lisinopril (ZESTRIL) 5 MG tablet Take 1.25 mg by mouth daily.   melatonin 5 MG TABS Take 5 mg by mouth at bedtime.   metoprolol tartrate (LOPRESSOR) 25 MG tablet TAKE ONE TABLET BY  MOUTH TWICE A DAY   montelukast (SINGULAIR) 10 MG tablet Take 1 tablet (10 mg total) by mouth daily after supper.   Multiple Vitamin (MULTIVITAMIN WITH MINERALS) TABS tablet Take 1 tablet by mouth daily. Centrum 50 plus   Multiple Vitamins-Minerals (EMERGEN-C VITAMIN C PO) Take 1 packet by mouth daily. Mix with water   omeprazole (PRILOSEC) 20 MG capsule Take 20 mg by mouth daily before breakfast.   rosuvastatin (CRESTOR) 20 MG tablet Take 1 tablet (20 mg total) by mouth daily.   Trolamine Salicylate (ASPERCREME EX) Apply 1 application topically daily as needed (knee pain).   [DISCONTINUED] Coenzyme Q10 (CO Q 10 PO) Take 200 mg by mouth daily in the afternoon.   [DISCONTINUED] Omega-3 Fatty Acids (FISH OIL PO) Take 1,000 mg by mouth daily.     Allergies:   Codeine, Cyclobenzaprine, Iodinated contrast media, and Prednisone   Social History   Socioeconomic History   Marital  status: Divorced    Spouse name: Not on file   Number of children: Not on file   Years of education: Not on file   Highest education level: Not on file  Occupational History   Not on file  Tobacco Use   Smoking status: Former    Packs/day: 1.00    Years: 20.00    Total pack years: 20.00    Types: Cigarettes   Smokeless tobacco: Never   Tobacco comments:    Quit 30 yrs ago as of 07/2021 per daughter  Vaping Use   Vaping Use: Never used  Substance and Sexual Activity   Alcohol use: Yes    Comment: occasiona wine   Drug use: Never   Sexual activity: Not Currently    Birth control/protection: Post-menopausal  Other Topics Concern   Not on file  Social History Narrative   Tobacco use, amount per day now: None   Past tobacco use, amount per day: Up to 1 pack   How many years did you use tobacco: 20 years.   Alcohol use (drinks per week): None.   Diet: 3 meals daily/mostly cooked at home.   Do you drink/eat things with caffeine: periodic diet coke.   Marital status:  Divorced.                                What year were you married? 1968   Do you live in a house, apartment, assisted living, condo, trailer, etc.? One Level Townhome   Is it one or more stories? One Level   How many persons live in your home? One   Do you have pets in your home?( please list) No   Highest Level of education completed? High School   Current or past profession: Programmer, systems.    Do you exercise?   Yes when able.                               Type and how often? Silver Sneakers 2-3 times weekly.   Do you have a living will? Yes   Do you have a DNR form?        No                           If not, do you want to discuss one? Yes   Do you have signed POA/HPOA forms?   Yes  If so, please bring to you appointment      Do you have any difficulty bathing or dressing yourself? Yes   Do you have any difficulty preparing food or eating? Yes   Do you have any difficulty  managing your medications? Yes   Do you have any difficulty managing your finances? No   Do you have any difficulty affording your medications? No   Social Determinants of Health   Financial Resource Strain: Low Risk  (05/14/2019)   Overall Financial Resource Strain (CARDIA)    Difficulty of Paying Living Expenses: Not hard at all  Food Insecurity: Unknown (05/14/2019)   Hunger Vital Sign    Worried About Running Out of Food in the Last Year: Patient refused    [place] in the Last Year: Patient refused  Transportation Needs: No Transportation Needs (05/14/2019)   PRAPARE - Hydrologist (Medical): No    Lack of Transportation (Non-Medical): No  Physical Activity: Not on file  Stress: Not on file  Social Connections: Unknown (05/14/2019)   Social Connection and Isolation Panel [NHANES]    Frequency of Communication with Friends and Family: More than three times a week    Frequency of Social Gatherings with Friends and Family: More than three times a week    Attends Religious Services: Never    Printmaker: Not on file    Attends Archivist Meetings: Not on file    Marital Status: Not on file     Family History: The patient's family history includes Alcohol abuse in her sister; Brain cancer in her brother; Cancer in her brother and sister; Diabetes in her brother; Down syndrome in her brother; Endometriosis in her daughter; Heart Problems in her brother; Heart failure in her brother and brother; Hypertension in an other family member; Lung cancer in her sister; Melanoma in her brother.  ROS:   Please see the history of present illness.     All other systems reviewed and are negative.  EKGs/Labs/Other Studies Reviewed:     EKG:  EKG is not ordered today.    Recent Labs: 07/30/2021: B Natriuretic Peptide 194.4 08/04/2021: Magnesium 2.1 02/15/2022: TSH 1.33 03/25/2022: ALT 35 03/30/2022: BUN 16; Creatinine, Ser  0.93; Hemoglobin 10.1; Platelets 265; Potassium 3.7; Sodium 135  Recent Lipid Panel    Component Value Date/Time   CHOL 204 (H) 03/26/2022 0137   TRIG 77 03/26/2022 0137   HDL 86 03/26/2022 0137   CHOLHDL 2.4 03/26/2022 0137   VLDL 15 03/26/2022 0137   LDLCALC 103 (H) 03/26/2022 0137   LDLCALC 95 02/15/2022 1119      Physical Exam:    VS:  BP (!) 146/64   Pulse 67   Ht 5\' 1"  (1.549 m)   Wt 119 lb 6.4 oz (54.2 kg)   SpO2 91%   BMI 22.56 kg/m     Wt Readings from Last 3 Encounters:  04/12/22 119 lb 6.4 oz (54.2 kg)  03/26/22 117 lb 8.1 oz (53.3 kg)  02/15/22 122 lb 6.4 oz (55.5 kg)     GEN: Thin caucasian female, well developed in no acute distress HEENT: Normal NECK: No JVD LYMPHATICS: No lymphadenopathy CARDIAC: RRR, gr 2/6 diastolic murmur LSB, no rubs, gallops RESPIRATORY:  Clear to auscultation without rales, wheezing or rhonchi  ABDOMEN: Soft, non-tender, non-distended MUSCULOSKELETAL:  No edema; No deformity  SKIN: Warm and dry NEUROLOGIC:  Alert and oriented x 3. Some word  finding difficulty.  PSYCHIATRIC:  Normal affect   Cardiac studies:  Echo 08/04/21:    1. Left ventricular ejection fraction, by estimation, is 65 to 70%. The  left ventricle has normal function. The left ventricle has no regional  wall motion abnormalities. There is mild left ventricular hypertrophy of  the basal-septal segment. Left  ventricular diastolic parameters are consistent with Grade I diastolic  dysfunction (impaired relaxation). Elevated left atrial pressure.   2. Right ventricular systolic function is normal. The right ventricular  size is normal. Tricuspid regurgitation signal is inadequate for assessing  PA pressure.   3. Left atrial size was mildly dilated.   4. The mitral valve is degenerative. Mild mitral valve regurgitation.  Mild mitral stenosis. The mean mitral valve gradient is 7.0 mmHg with  average heart rate of 93 bpm. Severe mitral annular calcification.   5.  There is trivial perivalvular leak in the vicinity of the right  coronary ostium. The aortic valve has been repaired/replaced. Aortic valve  regurgitation is mild. There is a 23 mm Sapien prosthetic (TAVR) valve  present in the aortic position.  Procedure Date: 08/03/21. Aortic valve mean gradient measures 10.2 mmHg.  Aortic valve Vmax measures 2.22 m/s. Aortic valve acceleration time  measures 58 msec.   6. The inferior vena cava is dilated in size with <50% respiratory  variability, suggesting right atrial pressure of 15 mmHg.    ______________________   Echo 09/10/21 IMPRESSIONS  1. 23 mm S3 is present in the aortic position. There is abnormal color  flow in the region of the prosthesis, but this is forward flow based on  color doppler. There is no apparenent regurgitation or paravalvular leak.  Possibly, this represents flow  between the stent frame and aortic root. There is also a small color  signal below the annulus in the 8-10 o'clock position. Cannot exclude a  very small iatrogenic VSD. Vmax 2.1 m/s, MG 9.5 mmHG, EOA 2.42 cm2.  Hemodynamics are within limits. Would  consider TEE for characterization if clinically indicated. The aortic  valve has been repaired/replaced. Aortic valve regurgitation is not  visualized. There is a 23 mm Sapien prosthetic (TAVR) valve present in the  aortic position. Procedure Date:  08/03/2021.   2. Left ventricular ejection fraction, by estimation, is >75%. The left  ventricle has hyperdynamic function. The left ventricle has no regional  wall motion abnormalities. There is moderate concentric left ventricular  hypertrophy. Left ventricular  diastolic function could not be evaluated.   3. Right ventricular systolic function is normal. The right ventricular  size is normal. There is normal pulmonary artery systolic pressure. The  estimated right ventricular systolic pressure is 26.0 mmHg.   4. Left atrial size was mildly dilated.   5. The mitral  valve is degenerative. No evidence of mitral valve  regurgitation. Moderate mitral annular calcification.   6. The inferior vena cava is normal in size with greater than 50%  respiratory variability, suggesting right atrial pressure of 3 mmHg.   Comparison(s): No significant change from prior study. AoV gradients  stable. Color jet described above that is abnormal.     Corky Crafts, MD (Primary)     Procedures  RIGHT/LEFT HEART CATH AND CORONARY ANGIOGRAPHY   Conclusion      Prox RCA lesion is 50% stenosed.   Prox Cx to Mid Cx lesion is 25% stenosed.   Prox LAD to Mid LAD lesion is 25% stenosed.   Hemodynamic findings consistent with pulmonary hypertension.  Stable, mild to moderate coronary artery disease.  No indication for PCI.   Unable to cross aortic valve, consistent with worsening noted on echocardiogram.  Continue plans for TAVR w/u.   Echo 03/26/22: IMPRESSIONS     1. Left ventricular ejection fraction, by estimation, is 65 to 70%. The  left ventricle has normal function. The left ventricle has no regional  wall motion abnormalities. There is moderate left ventricular hypertrophy.  Left ventricular diastolic  parameters are indeterminate.   2. Right ventricular systolic function is normal. The right ventricular  size is normal. Tricuspid regurgitation signal is inadequate for assessing  PA pressure.   3. Left atrial size was moderately dilated.   4. The mitral valve is degenerative. Trivial mitral valve regurgitation.  Mild mitral stenosis. The mean mitral valve gradient is 5.0 mmHg with  average heart rate of 68 bpm. Severe mitral annular calcification.   5. The abnormal color flow signal seen previously below the prosthetic  aortic valve is seen again on this exam, color flow in multiple views  suggestive of valvular regurgitation, though it cannot be demonstrated  with spectral Doppler, which would  support color flow artifact, possibly from stent  frame of valve. No  evidence of prosthetic valve obstruction or significant regurgitation.      The aortic valve has been repaired/replaced. Aortic valve  regurgitation is mild. There is a 23 mm Sapien prosthetic (TAVR) valve  present in the aortic position. Procedure Date: 08/03/21. Echo findings are  consistent with likely stable structure and  function of the aortic valve prosthesis. Aortic valve area, by VTI  measures 2.58 cm. Aortic valve mean gradient measures 11.0 mmHg. Aortic  valve Vmax measures 1.98 m/s. Aortic valve acceleration time measures 85  msec.   6. The inferior vena cava is normal in size with greater than 50%  respiratory variability, suggesting right atrial pressure of 3 mmHg.    ASSESSMENT:     Severe AS s/p TAVR: echo shows normal EF ,  normally functioning TAVR with a mean gradient of 9.5 mm hg and no PVL. There is abnormal color flow in the region of the prosthesis, but this is forward flow based on  color doppler. There is no apparenent regurgitation or paravalvular leak. Possibly, this represents flow  between the stent frame and aortic root. She has a AI murmur.  She has NYHA class I-II symptoms. She has Amoxicillin for SBE prophylaxis. Continue anticoagulation.   Will be seen in valve clinic in one year.   2.  Recent left MCA stroke. Was on Eliquis - now switched to Pradaxa. No evidence of aFib during hospitalization. Prior Afib burden low. Continue PT,OT, and speech therapy.   3.  Hx of pericardial effusion: s/p pericardial drainage. no recurrent effusion on echo   4. Paroxysmal fibrillation: continue Pradaxa given recent CVA on Eliquis. Continue  Lopresor 25mg  BID. (1% burden on most recent monitor).    5. Carotid artery disease: pre TAVR head/neck CTA with moderate right carotid artery stenosis with plans for surveillance dopplers in the OP setting. She is followed by [facility]- Dr . Dopplers in Feb showed moderate RICA disease. More recent CT angio suggests  severe stenosis at origin of RICA similar to prior CT. Will need follow up with [facility].    5. s/p Non-ST elevation (NSTEMI) myocardial infarction (HCC) due to demand ischemia in setting of stress induced CM in 2020 05/13/2019- HS Troponin peak 879. With decreased EF and no significant CAD strongly suspect this  is related to recurrent Takotsubo cardiomyopathy  6. Takotsubo cardiomyopathy H/O remote Takotsubo event 5 years ago-Recurrent in October 2020 EF 30-35% by echo 05/13/2019 when she presented with second event - now with normal LV function.  7. Essential hypertension Well  controlled today. Continue metoprolol and low dose lisinopril.  8. Dyslipidemia, goal LDL below 70 Recent LDL 103. Crestor increased to 20 mg during recent hospital stay. Will need repeat lab in 2-3 months. Daughter states she may have had problems with higher dose in the past. Will monitor.   Follow up with me in 3 months.  Medication Adjustments/Labs and Tests Ordered: Current medicines are reviewed at length with the patient today.  Concerns regarding medicines are outlined above.  No orders of the defined types were placed in this encounter.  No orders of the defined types were placed in this encounter.   There are no Patient Instructions on file for this visit.   Signed, Osvaldo Lamping Martinique, MD  04/12/2022 11:34 AM    [facility]

## 2022-04-12 ENCOUNTER — Encounter: Payer: Self-pay | Admitting: Cardiology

## 2022-04-12 ENCOUNTER — Ambulatory Visit: Payer: Medicare HMO | Attending: Cardiology | Admitting: Cardiology

## 2022-04-12 VITALS — BP 146/64 | HR 67 | Ht 61.0 in | Wt 119.4 lb

## 2022-04-12 DIAGNOSIS — I350 Nonrheumatic aortic (valve) stenosis: Secondary | ICD-10-CM

## 2022-04-12 DIAGNOSIS — I63512 Cerebral infarction due to unspecified occlusion or stenosis of left middle cerebral artery: Secondary | ICD-10-CM

## 2022-04-12 DIAGNOSIS — I6529 Occlusion and stenosis of unspecified carotid artery: Secondary | ICD-10-CM

## 2022-04-12 DIAGNOSIS — Z952 Presence of prosthetic heart valve: Secondary | ICD-10-CM

## 2022-04-12 DIAGNOSIS — I10 Essential (primary) hypertension: Secondary | ICD-10-CM

## 2022-04-12 DIAGNOSIS — I5032 Chronic diastolic (congestive) heart failure: Secondary | ICD-10-CM

## 2022-04-14 ENCOUNTER — Telehealth: Payer: Self-pay | Admitting: Cardiology

## 2022-04-14 NOTE — Telephone Encounter (Signed)
STAT if patient feels like he/she is going to faint   Are you dizzy now? Not with the patient  Do you feel faint or have you passed out? no  Do you have any other symptoms? Stomach pain, confusion  Have you checked your HR and BP (record if available)? Not with the patient  Patient's daughter says she spoke with the patient who said she had a 15-20 minutes episode of dizziness. She says the patient had dizziness, loss of balance, confusion, and stomach pain. She says the patient did not mention any other symptoms. She says she did make the nursing staff aware at the patient's living facility and was told they would contact an on call doctor. She says she is not sure if she needs to have them call 911. She states she is concerned about it being a stroke and says when the patient had a stroke in the past she had confusion walking.

## 2022-04-14 NOTE — Telephone Encounter (Signed)
Agree with your recommendations  Peter Jordan MD, FACC  

## 2022-04-14 NOTE — Telephone Encounter (Signed)
Spoke with patient's daughter, Earnest Bailey. She was seen on 9/19 after stroke. She is in skilled-nursing. Today at [facility], daughter visited her, and patient reports today she was faint, dizzy, confused, unable to walk with walker, stomach pain - episode lasted 20 minutes. Patient did not tell anyone about this during episode. Daughter said patient is "out of it" and extremely fatigued and appears confused, saying she "took a pill she found on the floor". Nurse at [facility] assessed patient and patient was able to communicate with nurse. She does have expressive aphasia since last stroke. She does not see neuro outpatient until 10/5. Daughter is very concerned about her mother and her behavior and this is a change. Advised she should contact NP/PA/MD at [facility] for an eval. If this is not available and she is very concerned about her mom, she should take her to ED for eval.

## 2022-04-22 ENCOUNTER — Telehealth: Payer: Self-pay

## 2022-04-22 DIAGNOSIS — I6523 Occlusion and stenosis of bilateral carotid arteries: Secondary | ICD-10-CM

## 2022-04-22 NOTE — Telephone Encounter (Signed)
Pt's daughter, Earnest Bailey, called stating that the pt had a stroke on 9/1 and Dr. Peter Martinique at [facility] on [address] felt that she should be seen earlier than her 1 yr f/u in Feb.  Reviewed pt's chart, returned call for clarification, two identifiers used. Earnest Bailey explained that there had been some discrepancies with the CT's and [place] and needed some guidance as to what kind of appt to schedule. Spoke with Dr Virl Cagey, who was happy to see the pt and asked to schedule it with a BL carotid US.

## 2022-04-27 NOTE — Progress Notes (Unsigned)
[facility] [address] [place]. [place] [address] [phone]       HOSPITAL FOLLOW UP NOTE  [name] Date of Birth:  05/15/1935 Medical Record Number:  [id number]   Reason for Referral:  hospital stroke follow up    SUBJECTIVE:   CHIEF COMPLAINT:  No chief complaint on file.   HPI:   Barbara Mcconnell is a 87 y.o. female with a past medical history anxiety, arthritis, CAD, GERD, heart murmur, hemorrhoids, HLD, HTN, afib on eliquis, TAVR, and takotsubo cardiomyopathy who presented on 03/25/2022 with word finding difficulties and confusion.  Personally reviewed hospitalization pertinent progress notes, lab work and imaging.  Stroke work-up revealed left MCA infarct with left M2 occlusion s/p IR with reocclusion of left M3, embolic secondary to A-fib even on Eliquis. Felt Eliquis failure therefore switched to Pradaxa 150mg  BID.  CTA showed right ICA 80 to 90% stenosis, chronic per family followed by PCP.  LDL 103, increase Crestor dosage from 5 mg to 20 mg daily.  No prior stroke history.  She was discharged to SNF for therapy recommendations.        PERTINENT IMAGING  Per hospitalization 03/25/2022 -03/30/2022 CT head showed left M2 hyperdense sign and old right cerebellar infarct CTA head and neck left M2 occlusion, right ICA 80 to 90% stenosis Status post IR with reocclusion of left M3 MRI left MCA moderate sized infarct 2D Echo EF 65 to 70%, likely stable structure and function of the aortic valve prosthesis LDL 103 HgbA1c 5.4    ROS:   14 system review of systems performed and negative with exception of ***  PMH:  Past Medical History:  Diagnosis Date   Allergies    Anxiety    panic attacks   Aortic stenosis    Arthritis    CAD (coronary artery disease)    non obst CAD   GERD (gastroesophageal reflux disease)    Heart murmur    Hemorrhoids    History of mammogram    Hyperlipemia    Hypertension    Internal carotid artery stenosis     PAF (paroxysmal atrial fibrillation) (HCC)    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 Ali Lowe & Dr. Cyndia Bent   Seasonal allergies    Takotsubo cardiomyopathy     PSH:  Past Surgical History:  Procedure Laterality Date   COLONOSCOPY     EYE SURGERY Bilateral    cataracts removed   INTRAOPERATIVE TRANSTHORACIC ECHOCARDIOGRAM N/A 08/03/2021   Procedure: INTRAOPERATIVE TRANSTHORACIC ECHOCARDIOGRAM;  Surgeon: Early Osmond, MD;  Location: [facility];  Service: Open Heart Surgery;  Laterality: N/A;   IR CT HEAD LTD  03/25/2022   IR PERCUTANEOUS ART THROMBECTOMY/INFUSION INTRACRANIAL INC DIAG ANGIO  03/25/2022   IR US GUIDE VASC ACCESS RIGHT  03/25/2022   PERICARDIOCENTESIS N/A 07/28/2021   Procedure: PERICARDIOCENTESIS;  Surgeon: Early Osmond, MD;  Location: [facility];  Service: Cardiovascular;  Laterality: N/A;   RADIOLOGY WITH ANESTHESIA N/A 03/25/2022   Procedure: IR WITH ANESTHESIA;  Surgeon: Radiologist, Medication, MD;  Location: [facility];  Service: Radiology;  Laterality: N/A;   RIGHT/LEFT HEART CATH AND CORONARY ANGIOGRAPHY N/A 05/13/2019   Procedure: RIGHT/LEFT HEART CATH AND CORONARY ANGIOGRAPHY;  Surgeon: Martinique, Peter M, MD;  Location: [facility];  Service: Cardiovascular;  Laterality: N/A;   RIGHT/LEFT HEART CATH AND CORONARY ANGIOGRAPHY N/A 07/23/2021  Procedure: RIGHT/LEFT HEART CATH AND CORONARY ANGIOGRAPHY;  Surgeon: Jettie Booze, MD;  Location: [facility];  Service: Cardiovascular;  Laterality: N/A;   TONSILLECTOMY     TRANSCATHETER AORTIC VALVE REPLACEMENT, TRANSFEMORAL N/A 08/03/2021   Procedure: TRANSCATHETER AORTIC VALVE REPLACEMENT, TRANSFEMORAL;  Surgeon: Early Osmond, MD;  Location: [facility];  Service: Open Heart Surgery;  Laterality: N/A;   UPPER GI ENDOSCOPY      Social History:  Social History   Socioeconomic History    Marital status: Divorced    Spouse name: Not on file   Number of children: Not on file   Years of education: Not on file   Highest education level: Not on file  Occupational History   Not on file  Tobacco Use   Smoking status: Former    Packs/day: 1.00    Years: 20.00    Total pack years: 20.00    Types: Cigarettes   Smokeless tobacco: Never   Tobacco comments:    Quit 30 yrs ago as of 07/2021 per daughter  Vaping Use   Vaping Use: Never used  Substance and Sexual Activity   Alcohol use: Yes    Comment: occasiona wine   Drug use: Never   Sexual activity: Not Currently    Birth control/protection: Post-menopausal  Other Topics Concern   Not on file  Social History Narrative   Tobacco use, amount per day now: None   Past tobacco use, amount per day: Up to 1 pack   How many years did you use tobacco: 20 years.   Alcohol use (drinks per week): None.   Diet: 3 meals daily/mostly cooked at home.   Do you drink/eat things with caffeine: periodic diet coke.   Marital status:  Divorced.                                What year were you married? 1968   Do you live in a house, apartment, assisted living, condo, trailer, etc.? One Level Townhome   Is it one or more stories? One Level   How many persons live in your home? One   Do you have pets in your home?( please list) No   Highest Level of education completed? High School   Current or past profession: Programmer, systems.    Do you exercise?   Yes when able.                               Type and how often? Silver Sneakers 2-3 times weekly.   Do you have a living will? Yes   Do you have a DNR form?        No                           If not, do you want to discuss one? Yes   Do you have signed POA/HPOA forms?   Yes                     If so, please bring to you appointment      Do you have any difficulty bathing or dressing yourself? Yes   Do you have any difficulty preparing food or eating? Yes   Do you have any  difficulty managing your medications? Yes   Do you have any  difficulty managing your finances? No   Do you have any difficulty affording your medications? No   Social Determinants of Health   Financial Resource Strain: Low Risk  (05/14/2019)   Overall Financial Resource Strain (CARDIA)    Difficulty of Paying Living Expenses: Not hard at all  Food Insecurity: Unknown (05/14/2019)   Hunger Vital Sign    Worried About Running Out of Food in the Last Year: Patient refused    [place] in the Last Year: Patient refused  Transportation Needs: No Transportation Needs (05/14/2019)   PRAPARE - Hydrologist (Medical): No    Lack of Transportation (Non-Medical): No  Physical Activity: Not on file  Stress: Not on file  Social Connections: Unknown (05/14/2019)   Social Connection and Isolation Panel [NHANES]    Frequency of Communication with Friends and Family: More than three times a week    Frequency of Social Gatherings with Friends and Family: More than three times a week    Attends Religious Services: Never    Printmaker: Not on file    Attends Music therapist: Not on file    Marital Status: Not on file  Intimate Partner Violence: Not on file    Family History:  Family History  Problem Relation Age of Onset   Cancer Sister    Lung cancer Sister    Alcohol abuse Sister    Melanoma Brother    Brain cancer Brother    Heart failure Brother    Diabetes Brother    Cancer Brother    Heart Problems Brother    Down syndrome Brother    Heart failure Brother    Endometriosis Daughter    Hypertension Other     Medications:   Current Outpatient Medications on File Prior to Visit  Medication Sig Dispense Refill   acetaminophen (TYLENOL) 500 MG tablet Take 500-1,000 mg by mouth every 6 (six) hours as needed for moderate pain. (Patient not taking: Reported on 04/12/2022)     albuterol (VENTOLIN HFA) 108 (90  Base) MCG/ACT inhaler Inhale 2 puffs into the lungs every 4 (four) hours as needed for wheezing or shortness of breath (seasonal allergies).  (Patient not taking: Reported on 04/12/2022)     Cholecalciferol (VITAMIN D3 PO) Take 1,000 Units by mouth daily.     dabigatran (PRADAXA) 150 MG CAPS capsule Take 1 capsule (150 mg total) by mouth every 12 (twelve) hours. 60 capsule 5   escitalopram (LEXAPRO) 5 MG tablet Take 1 tablet (5 mg total) by mouth daily. 90 tablet 2   fexofenadine (ALLEGRA) 60 MG tablet Take 60 mg by mouth daily.     furosemide (LASIX) 20 MG tablet Take 0.5 tablets (10 mg total) by mouth 2 (two) times daily. 120 tablet 3   lisinopril (ZESTRIL) 5 MG tablet Take 1.25 mg by mouth daily.     melatonin 5 MG TABS Take 5 mg by mouth at bedtime.     metoprolol tartrate (LOPRESSOR) 25 MG tablet TAKE ONE TABLET BY MOUTH TWICE A DAY 180 tablet 2   montelukast (SINGULAIR) 10 MG tablet Take 1 tablet (10 mg total) by mouth daily after supper. 90 tablet 3   Multiple Vitamin (MULTIVITAMIN WITH MINERALS) TABS tablet Take 1 tablet by mouth daily. Centrum 50 plus     Multiple Vitamins-Minerals (EMERGEN-C VITAMIN C PO) Take 1 packet by mouth daily. Mix with water     omeprazole (PRILOSEC) 20 MG  capsule Take 20 mg by mouth daily before breakfast.     rosuvastatin (CRESTOR) 20 MG tablet Take 1 tablet (20 mg total) by mouth daily. 90 tablet 3   Trolamine Salicylate (ASPERCREME EX) Apply 1 application topically daily as needed (knee pain).     No current facility-administered medications on file prior to visit.    Allergies:   Allergies  Allergen Reactions   Codeine Nausea And Vomiting   Cyclobenzaprine Other (See Comments)    UNK   Iodinated Contrast Media Nausea Only   Prednisone Other (See Comments)    "Sores in the mouth"      OBJECTIVE:  Physical Exam  There were no vitals filed for this visit. There is no height or weight on file to calculate BMI. No results found.      No data  to display           General: well developed, well nourished, seated, in no evident distress Head: head normocephalic and atraumatic.   Neck: supple with no carotid or supraclavicular bruits Cardiovascular: regular rate and rhythm, no murmurs Musculoskeletal: no deformity Skin:  no rash/petichiae Vascular:  Normal pulses all extremities   Neurologic Exam Mental Status: Awake and fully alert. Oriented to place and time. Recent and remote memory intact. Attention span, concentration and fund of knowledge appropriate. Mood and affect appropriate.  Cranial Nerves: Fundoscopic exam reveals sharp disc margins. Pupils equal, briskly reactive to light. Extraocular movements full without nystagmus. Visual fields full to confrontation. Hearing intact. Facial sensation intact. Face, tongue, palate moves normally and symmetrically.  Motor: Normal bulk and tone. Normal strength in all tested extremity muscles Sensory.: intact to touch , pinprick , position and vibratory sensation.  Coordination: Rapid alternating movements normal in all extremities. Finger-to-nose and heel-to-shin performed accurately bilaterally. Gait and Station: Arises from chair without difficulty. Stance is normal. Gait demonstrates normal stride length and balance with ***. Tandem walk and heel toe ***.  Reflexes: 1+ and symmetric. Toes downgoing.     NIHSS  *** Modified Rankin  ***      ASSESSMENT: Barbara Mcconnell is a 87 y.o. year old female with left MCA infarct on 03/25/2022 with left M2 occlusion s/p IR with reocclusion of left M3, embolic secondary to A-fib even on Eliquis therefore switched to Pradaxa. Vascular risk factors include A-fib, CAD, HTN, HLD, s/p TAVR 07/2021, Takotsubo cardiomyopathy, right carotid stenosis and advanced age.      PLAN:  Left MCA stroke:  Residual deficit: ***.  Continue  Pradaxa 150 mg twice daily  and rosuvastatin (Crestor) 20 mg daily for secondary stroke prevention.   Discussed  secondary stroke prevention measures and importance of close PCP follow up for aggressive stroke risk factor management including BP goal<130/90, HLD with LDL goal<70 and DM with A1c.<7 .  Stroke labs 03/2022: LDL 103, A1c 5.4 I have gone over the pathophysiology of stroke, warning signs and symptoms, risk factors and their management in some detail with instructions to go to the closest emergency room for symptoms of concern. Atrial fibrillation: Recent stroke considered Eliquis failure, now on Pradaxa monitored/managed by cardiology Carotid stenosis: CTA R ICA 80 to 90% stenosis, followed by PCP     Follow up in *** or call earlier if needed   CC:  [facility] provider: Dr. Leonie Man PCP: Wardell Honour, MD    I spent *** minutes of face-to-face and non-face-to-face time with patient.  This included previsit chart review including review of recent hospitalization, lab review,  study review, order entry, electronic health record documentation, patient education regarding recent stroke including etiology, secondary stroke prevention measures and importance of managing stroke risk factors, residual deficits and typical recovery time and answered all other questions to patient satisfaction   Frann Rider, [facility]  [facility] [address] [place], [place] [address]  Phone [phone] Fax [phone] Note: This document was prepared with digital dictation and possible smart phrase technology. Any transcriptional errors that result from this process are unintentional.

## 2022-04-28 ENCOUNTER — Encounter: Payer: Self-pay | Admitting: Adult Health

## 2022-04-28 ENCOUNTER — Ambulatory Visit: Payer: Medicare HMO | Admitting: Adult Health

## 2022-04-28 VITALS — BP 160/64 | HR 59 | Ht 61.0 in | Wt 118.0 lb

## 2022-04-28 DIAGNOSIS — Z09 Encounter for follow-up examination after completed treatment for conditions other than malignant neoplasm: Secondary | ICD-10-CM | POA: Diagnosis not present

## 2022-04-28 DIAGNOSIS — I69328 Other speech and language deficits following cerebral infarction: Secondary | ICD-10-CM | POA: Diagnosis not present

## 2022-04-28 DIAGNOSIS — I69319 Unspecified symptoms and signs involving cognitive functions following cerebral infarction: Secondary | ICD-10-CM | POA: Diagnosis not present

## 2022-04-28 DIAGNOSIS — I63412 Cerebral infarction due to embolism of left middle cerebral artery: Secondary | ICD-10-CM

## 2022-04-28 NOTE — Patient Instructions (Addendum)
Continue working with therapies for hopeful ongoing recovery   Continue  Pradaxa   and Crestor  for secondary stroke prevention  Continue to follow up with PCP regarding cholesterol and blood pressure management  Maintain strict control of hypertension with blood pressure goal below 130/90 and cholesterol with LDL cholesterol (bad cholesterol) goal below 70 mg/dL.   Signs of a Stroke? Follow the BEFAST method:  Balance Watch for a sudden loss of balance, trouble with coordination or vertigo Eyes Is there a sudden loss of vision in one or both eyes? Or double vision?  Face: Ask the person to smile. Does one side of the face droop or is it numb?  Arms: Ask the person to raise both arms. Does one arm drift downward? Is there weakness or numbness of a leg? Speech: Ask the person to repeat a simple phrase. Does the speech sound slurred/strange? Is the person confused ? Time: If you observe any of these signs, call 911.     Followup in the future with me in 4 months or call earlier if needed       Thank you for coming to see [place] at [facility]. I hope we have been able to provide you high quality care today.  You may receive a patient satisfaction survey over the next few weeks. We would appreciate your feedback and comments so that we may continue to improve ourselves and the health of our patients.

## 2022-04-29 ENCOUNTER — Encounter: Payer: Self-pay | Admitting: Cardiology

## 2022-04-29 ENCOUNTER — Telehealth: Payer: Self-pay | Admitting: Cardiology

## 2022-04-29 NOTE — Progress Notes (Signed)
I agree with the above plan 

## 2022-04-29 NOTE — Telephone Encounter (Signed)
Patient's daughter states during 9/19 appointment with Dr. Martinique, made several medication changes on a written form, but [facility] is not accepting the changes because Dr. Martinique failed to sign the form. Patient's daughter states [facility] requires a signature for any major medication changes.

## 2022-04-29 NOTE — Telephone Encounter (Signed)
Spoke to patient's daughter Earnest Bailey.I received form from [facility].Dr.Jordan signed form to stop taking CoQ10,Fish Oil,Diltiazem.He advised continue all other medications.Form faxed to fax # [phone].

## 2022-04-29 NOTE — Telephone Encounter (Signed)
Daughter returned RN's call.  Daughter stated that she is following up to get the medication changes form signed by Dr. Martinique.  Daughter stated she will try uploading the form again to MyChart and faxing the form again.  Daughter stated the facility will not make the medication changes until they receive the signed form from Dr. Martinique.

## 2022-04-29 NOTE — Telephone Encounter (Signed)
Returned call to patient's daughter Earnest Bailey left message on personal voice mail to call me back.

## 2022-05-03 NOTE — Progress Notes (Unsigned)
Office Note     CC: Bilateral internal carotid artery stenosis right greater than left Requesting Provider:  Wardell Honour, MD  HPI: Barbara Mcconnell is a 87 y.o. (1934/12/20) female presenting in follow up with known bilateral internal carotid artery stenosis, right greater than left.  At her last visit there was a discrepancy between her most recent CT and ultrasound velocities questioning the percent stenosis of her right internal carotid artery.  CT angiogram was ordered, however Barbara Mcconnell elected not to pursue further surgical work-up, and treat medically.  Unfortunately, Barbara Mcconnell had a stroke in early September.  Interestingly, this was a left-sided stroke, where there was hardly any stenosis.  This required neuro IR intervention.  Since that time, she has been doing well.  She has had significant recovery, but still struggles with some problem-solving, aphasia, slight right-sided weakness.  On exam today, Barbara Mcconnell was accompanied by her daughter.  She denied recent stroke, TIA, amaurosis.  Current medication regimen includes Pradaxa.    Currently on statin therapy.  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 Ali Lowe & Dr. Cyndia Bent   Seasonal allergies    Takotsubo cardiomyopathy     Past Surgical History:  Procedure Laterality Date   COLONOSCOPY     EYE SURGERY Bilateral    cataracts removed   INTRAOPERATIVE TRANSTHORACIC ECHOCARDIOGRAM N/A 08/03/2021   Procedure: INTRAOPERATIVE TRANSTHORACIC ECHOCARDIOGRAM;  Surgeon: Early Osmond, MD;  Location: [facility];  Service: Open Heart Surgery;  Laterality: N/A;   IR CT HEAD LTD  03/25/2022   IR PERCUTANEOUS ART THROMBECTOMY/INFUSION INTRACRANIAL INC DIAG ANGIO  03/25/2022   IR US GUIDE VASC ACCESS RIGHT  03/25/2022   PERICARDIOCENTESIS N/A 07/28/2021   Procedure: PERICARDIOCENTESIS;  Surgeon: Early Osmond, MD;  Location: [facility];  Service: Cardiovascular;  Laterality: N/A;   RADIOLOGY WITH ANESTHESIA N/A 03/25/2022   Procedure: IR WITH ANESTHESIA;  Surgeon: Radiologist, Medication, MD;  Location: [facility];  Service: Radiology;  Laterality: N/A;   RIGHT/LEFT HEART CATH AND CORONARY ANGIOGRAPHY N/A 05/13/2019   Procedure: RIGHT/LEFT HEART CATH AND CORONARY ANGIOGRAPHY;  Surgeon: Martinique, Peter M, MD;  Location: [facility];  Service: Cardiovascular;  Laterality: N/A;   RIGHT/LEFT HEART CATH AND CORONARY ANGIOGRAPHY N/A 07/23/2021   Procedure: RIGHT/LEFT HEART CATH AND CORONARY ANGIOGRAPHY;  Surgeon: Jettie Booze, MD;  Location: [facility];  Service: Cardiovascular;  Laterality: N/A;   TONSILLECTOMY     TRANSCATHETER AORTIC VALVE REPLACEMENT, TRANSFEMORAL N/A 08/03/2021   Procedure: TRANSCATHETER AORTIC VALVE REPLACEMENT, TRANSFEMORAL;  Surgeon: Early Osmond, MD;  Location: [facility];  Service: Open Heart Surgery;  Laterality: N/A;   UPPER GI ENDOSCOPY      Social History   Socioeconomic History   Marital status: Divorced    Spouse name: Not on file   Number of children: Not on file   Years  of education: Not on file   Highest education level: Not on file  Occupational History   Not on file  Tobacco Use   Smoking status: Former    Packs/day: 1.00    Years: 20.00    Total pack years: 20.00    Types: Cigarettes   Smokeless tobacco: Never   Tobacco comments:    Quit 30 yrs ago as of 07/2021 per daughter  Vaping Use   Vaping Use: Never used  Substance and Sexual Activity   Alcohol use: Yes    Comment: occasiona wine    Drug use: Never   Sexual activity: Not Currently    Birth control/protection: Post-menopausal  Other Topics Concern   Not on file  Social History Narrative   Tobacco use, amount per day now: None   Past tobacco use, amount per day: Up to 1 pack   How many years did you use tobacco: 20 years.   Alcohol use (drinks per week): None.   Diet: 3 meals daily/mostly cooked at home.   Do you drink/eat things with caffeine: periodic diet coke.   Marital status:  Divorced.                                What year were you married? 1968   Do you live in a house, apartment, assisted living, condo, trailer, etc.? One Level Townhome   Is it one or more stories? One Level   How many persons live in your home? One   Do you have pets in your home?( please list) No   Highest Level of education completed? High School   Current or past profession: Immunologist.    Do you exercise?   Yes when able.                               Type and how often? Silver Sneakers 2-3 times weekly.   Do you have a living will? Yes   Do you have a DNR form?        No                           If not, do you want to discuss one? Yes   Do you have signed POA/HPOA forms?   Yes                     If so, please bring to you appointment      Do you have any difficulty bathing or dressing yourself? Yes   Do you have any difficulty preparing food or eating? Yes   Do you have any difficulty managing your medications? Yes   Do you have any difficulty managing your finances? No   Do you have any difficulty affording your medications? No   Social Determinants of Health   Financial Resource Strain: Low Risk  (05/14/2019)   Overall Financial Resource Strain (CARDIA)    Difficulty of Paying Living Expenses: Not hard at all  Food Insecurity: Unknown (05/14/2019)   Hunger Vital Sign    Worried About Running Out of Food in the Last Year: Patient refused    Ran Out of Food in the Last Year: Patient refused   Transportation Needs: No Transportation Needs (05/14/2019)   PRAPARE - Administrator, Civil Service (Medical): No  Lack of Transportation (Non-Medical): No  Physical Activity: Not on file  Stress: Not on file  Social Connections: Unknown (05/14/2019)   Social Connection and Isolation Panel [NHANES]    Frequency of Communication with Friends and Family: More than three times a week    Frequency of Social Gatherings with Friends and Family: More than three times a week    Attends Religious Services: Never    Diplomatic Services operational officer: Not on file    Attends Banker Meetings: Not on file    Marital Status: Not on file  Intimate Partner Violence: Not on file    Family History  Problem Relation Age of Onset   Cancer Sister    Lung cancer Sister    Alcohol abuse Sister    Melanoma Brother    Brain cancer Brother    Heart failure Brother    Diabetes Brother    Cancer Brother    Heart Problems Brother    Down syndrome Brother    Heart failure Brother    Endometriosis Daughter    Hypertension Other     Current Outpatient Medications  Medication Sig Dispense Refill   acetaminophen (TYLENOL) 500 MG tablet Take 500-1,000 mg by mouth every 6 (six) hours as needed for moderate pain.     albuterol (VENTOLIN HFA) 108 (90 Base) MCG/ACT inhaler Inhale 2 puffs into the lungs every 4 (four) hours as needed for wheezing or shortness of breath (seasonal allergies).     Cholecalciferol (VITAMIN D3 PO) Take 1,000 Units by mouth daily.     dabigatran (PRADAXA) 150 MG CAPS capsule Take 1 capsule (150 mg total) by mouth every 12 (twelve) hours. 60 capsule 5   escitalopram (LEXAPRO) 5 MG tablet Take 1 tablet (5 mg total) by mouth daily. 90 tablet 2   fexofenadine (ALLEGRA) 60 MG tablet Take 60 mg by mouth daily.     furosemide (LASIX) 20 MG tablet Take 0.5 tablets (10 mg total) by mouth 2 (two) times daily. 120 tablet 3   lisinopril (ZESTRIL) 5 MG tablet Take  1.25 mg by mouth daily.     melatonin 5 MG TABS Take 5 mg by mouth at bedtime.     metoprolol tartrate (LOPRESSOR) 25 MG tablet TAKE ONE TABLET BY MOUTH TWICE A DAY 180 tablet 2   montelukast (SINGULAIR) 10 MG tablet Take 1 tablet (10 mg total) by mouth daily after supper. 90 tablet 3   Multiple Vitamin (MULTIVITAMIN WITH MINERALS) TABS tablet Take 1 tablet by mouth daily. Centrum 50 plus     Multiple Vitamins-Minerals (EMERGEN-C VITAMIN C PO) Take 1 packet by mouth daily. Mix with water     omeprazole (PRILOSEC) 20 MG capsule Take 20 mg by mouth daily before breakfast.     rosuvastatin (CRESTOR) 20 MG tablet Take 1 tablet (20 mg total) by mouth daily. 90 tablet 3   Trolamine Salicylate (ASPERCREME EX) Apply 1 application topically daily as needed (knee pain).     No current facility-administered medications for this visit.    Allergies  Allergen Reactions   Codeine Nausea And Vomiting   Cyclobenzaprine Other (See Comments)    UNK   Iodinated Contrast Media Nausea Only   Prednisone Other (See Comments)    "Sores in the mouth"     REVIEW OF SYSTEMS:   [X]  denotes positive finding, [ ]  denotes negative finding Cardiac  Comments:  Chest pain or chest pressure:    Shortness of breath upon exertion:  Short of breath when lying flat:    Irregular heart rhythm:        Vascular    Pain in calf, thigh, or hip brought on by ambulation:    Pain in feet at night that wakes you up from your sleep:     Blood clot in your veins:    Leg swelling:         Pulmonary    Oxygen at home:    Productive cough:     Wheezing:         Neurologic    Sudden weakness in arms or legs:     Sudden numbness in arms or legs:     Sudden onset of difficulty speaking or slurred speech:    Temporary loss of vision in one eye:     Problems with dizziness:         Gastrointestinal    Blood in stool:     Vomited blood:         Genitourinary    Burning when urinating:     Blood in urine:         Psychiatric    Major depression:         Hematologic    Bleeding problems:    Problems with blood clotting too easily:        Skin    Rashes or ulcers:        Constitutional    Fever or chills:      PHYSICAL EXAMINATION:  There were no vitals filed for this visit.  General:  WDWN in NAD; vital signs documented above Gait: Not observed HENT: WNL, normocephalic Pulmonary: normal non-labored breathing , without wheezing Cardiac: regular HR Abdomen: soft, NT, no masses Skin: without rashes Vascular Exam/Pulses:  Right Left  Radial 2+ (normal) 2+ (normal)  Ulnar 2+ (normal) 2+ (normal)  Femoral    Popliteal    DP 2+ (normal) 2+ (normal)  PT 2+ (normal) 2+ (normal)   Extremities: without ischemic changes, without Gangrene , without cellulitis; without open wounds;  Musculoskeletal: no muscle wasting or atrophy  Neurologic: A&O X 3;  No focal weakness or paresthesias are detected Psychiatric:  The pt has Normal affect.   Non-Invasive Vascular Imaging:   Summary:  Right Carotid: Velocities in the right ICA are consistent with a 40-59%                 stenosis.   Left Carotid: Velocities in the left ICA are consistent with a 1-39%  stenosis.   CTA IMPRESSION: No evidence of thoracoabdominal aortic aneurysm or dissection.   Peripheral vascular disease with greater than 90% stenosis of the proximal right internal carotid artery. Plaque morphology appears irregular and may be better assessed with dedicated carotid artery Doppler sonography. 50% stenosis of the a dominant left vertebral artery at its origin. 50-75% stenosis of the inferior mesenteric artery at its origin, though this is of questionable clinical significance given patency of the celiac axis and superior mesenteric artery.  ASSESSMENT/PLAN: Cresencia Asmus is a 87 y.o. female presenting with asymptomatic right-sided ICA stenosis.  Ultrasound today demonstrated moderate stenosis, however CT angiogram  demonstrated 80 to 90% stenosis.  I reviewed her recent MRI, which demonstrates small areas of restricted diffusion in the right superior frontal gyrus.  This is from small vessel disease.  I had a long discussion with [facility] regarding the above. Literature demonstrates a reduction in stroke rate from 11% to 5% in 5 years in patients  with asymptomatic critical ICA stenosis, effectively reducing the risk of stroke by half.  I offered Barbara Mcconnell transcarotid artery revascularization.  At 87 years old, I think it is reasonable for Barbara Mcconnell to choose surgery or to treat medically at this point.  I asked that both her and her daughter consider their options.    I plan to call Barbara Mcconnell and Barbara Mcconnell in the coming weeks to discuss their decision to either pursue surgery or treat medically.  I plan to route this note to both her primary and [facility] neurology to ensure the right-sided hits are from small vessel disease.    In the interim, we discussed the signs and symptoms of stroke including TIA and amaurosis.  I asked her to seek immediate medical attention should any of these occur. She was asked to continue her current medication regimen.   Barbara SparrowJoshua E Essynce Munsch, MD [facility] [phone]

## 2022-05-04 ENCOUNTER — Encounter: Payer: Self-pay | Admitting: Family Medicine

## 2022-05-05 NOTE — Telephone Encounter (Signed)
Forwarded message to Dr. Sabra Heck and Janett Billow (due to Dr. Sabra Heck out of Office)

## 2022-05-06 ENCOUNTER — Ambulatory Visit (HOSPITAL_COMMUNITY)
Admission: RE | Admit: 2022-05-06 | Discharge: 2022-05-06 | Disposition: A | Discharge status: Home / Self Care | Payer: Medicare HMO | Source: Ambulatory Visit | Attending: Vascular Surgery | Admitting: Vascular Surgery

## 2022-05-06 ENCOUNTER — Encounter: Payer: Self-pay | Admitting: Vascular Surgery

## 2022-05-06 ENCOUNTER — Ambulatory Visit: Payer: Medicare HMO | Admitting: Vascular Surgery

## 2022-05-06 VITALS — BP 169/73 | HR 76 | Temp 97.9°F | Resp 20 | Ht 61.0 in | Wt 117.5 lb

## 2022-05-06 DIAGNOSIS — I6523 Occlusion and stenosis of bilateral carotid arteries: Secondary | ICD-10-CM | POA: Diagnosis present

## 2022-05-06 DIAGNOSIS — I6521 Occlusion and stenosis of right carotid artery: Secondary | ICD-10-CM

## 2022-05-10 ENCOUNTER — Encounter (HOSPITAL_COMMUNITY): Payer: Self-pay

## 2022-05-10 ENCOUNTER — Encounter: Payer: Self-pay | Admitting: Family Medicine

## 2022-05-10 ENCOUNTER — Inpatient Hospital Stay (HOSPITAL_BASED_OUTPATIENT_CLINIC_OR_DEPARTMENT_OTHER)
Admission: EM | Admit: 2022-05-10 | Discharge: 2022-05-15 | DRG: 312 | Disposition: A | Discharge status: Home / Self Care | Payer: Medicare HMO | Source: Skilled Nursing Facility | Attending: Internal Medicine | Admitting: Internal Medicine

## 2022-05-10 ENCOUNTER — Emergency Department (HOSPITAL_BASED_OUTPATIENT_CLINIC_OR_DEPARTMENT_OTHER): Payer: Medicare HMO

## 2022-05-10 ENCOUNTER — Other Ambulatory Visit: Payer: Self-pay

## 2022-05-10 ENCOUNTER — Emergency Department (HOSPITAL_BASED_OUTPATIENT_CLINIC_OR_DEPARTMENT_OTHER): Payer: Medicare HMO | Admitting: Radiology

## 2022-05-10 ENCOUNTER — Encounter (HOSPITAL_BASED_OUTPATIENT_CLINIC_OR_DEPARTMENT_OTHER): Payer: Self-pay | Admitting: Emergency Medicine

## 2022-05-10 DIAGNOSIS — I110 Hypertensive heart disease with heart failure: Secondary | ICD-10-CM | POA: Diagnosis present

## 2022-05-10 DIAGNOSIS — R748 Abnormal levels of other serum enzymes: Secondary | ICD-10-CM | POA: Diagnosis not present

## 2022-05-10 DIAGNOSIS — I2510 Atherosclerotic heart disease of native coronary artery without angina pectoris: Secondary | ICD-10-CM | POA: Diagnosis present

## 2022-05-10 DIAGNOSIS — R9389 Abnormal findings on diagnostic imaging of other specified body structures: Secondary | ICD-10-CM | POA: Diagnosis not present

## 2022-05-10 DIAGNOSIS — W19XXXA Unspecified fall, initial encounter: Secondary | ICD-10-CM | POA: Diagnosis not present

## 2022-05-10 DIAGNOSIS — I471 Supraventricular tachycardia, unspecified: Secondary | ICD-10-CM | POA: Diagnosis present

## 2022-05-10 DIAGNOSIS — Z952 Presence of prosthetic heart valve: Secondary | ICD-10-CM

## 2022-05-10 DIAGNOSIS — I480 Paroxysmal atrial fibrillation: Secondary | ICD-10-CM

## 2022-05-10 DIAGNOSIS — Z87891 Personal history of nicotine dependence: Secondary | ICD-10-CM | POA: Diagnosis not present

## 2022-05-10 DIAGNOSIS — Z23 Encounter for immunization: Secondary | ICD-10-CM

## 2022-05-10 DIAGNOSIS — I4892 Unspecified atrial flutter: Secondary | ICD-10-CM | POA: Diagnosis present

## 2022-05-10 DIAGNOSIS — E871 Hypo-osmolality and hyponatremia: Secondary | ICD-10-CM | POA: Diagnosis present

## 2022-05-10 DIAGNOSIS — I5032 Chronic diastolic (congestive) heart failure: Secondary | ICD-10-CM | POA: Diagnosis not present

## 2022-05-10 DIAGNOSIS — R296 Repeated falls: Secondary | ICD-10-CM | POA: Diagnosis present

## 2022-05-10 DIAGNOSIS — Y92009 Unspecified place in unspecified non-institutional (private) residence as the place of occurrence of the external cause: Secondary | ICD-10-CM

## 2022-05-10 DIAGNOSIS — I951 Orthostatic hypotension: Secondary | ICD-10-CM | POA: Diagnosis not present

## 2022-05-10 DIAGNOSIS — S022XXA Fracture of nasal bones, initial encounter for closed fracture: Secondary | ICD-10-CM | POA: Diagnosis not present

## 2022-05-10 DIAGNOSIS — R569 Unspecified convulsions: Secondary | ICD-10-CM

## 2022-05-10 DIAGNOSIS — E861 Hypovolemia: Secondary | ICD-10-CM | POA: Diagnosis present

## 2022-05-10 DIAGNOSIS — I2721 Secondary pulmonary arterial hypertension: Secondary | ICD-10-CM | POA: Diagnosis present

## 2022-05-10 DIAGNOSIS — F419 Anxiety disorder, unspecified: Secondary | ICD-10-CM | POA: Diagnosis not present

## 2022-05-10 DIAGNOSIS — S0003XA Contusion of scalp, initial encounter: Secondary | ICD-10-CM | POA: Diagnosis present

## 2022-05-10 DIAGNOSIS — I779 Disorder of arteries and arterioles, unspecified: Secondary | ICD-10-CM | POA: Diagnosis present

## 2022-05-10 DIAGNOSIS — I6523 Occlusion and stenosis of bilateral carotid arteries: Secondary | ICD-10-CM | POA: Diagnosis not present

## 2022-05-10 DIAGNOSIS — I69351 Hemiplegia and hemiparesis following cerebral infarction affecting right dominant side: Secondary | ICD-10-CM

## 2022-05-10 DIAGNOSIS — Z66 Do not resuscitate: Secondary | ICD-10-CM | POA: Diagnosis present

## 2022-05-10 DIAGNOSIS — R42 Dizziness and giddiness: Secondary | ICD-10-CM

## 2022-05-10 DIAGNOSIS — Z79899 Other long term (current) drug therapy: Secondary | ICD-10-CM | POA: Diagnosis not present

## 2022-05-10 DIAGNOSIS — I6930 Unspecified sequelae of cerebral infarction: Secondary | ICD-10-CM | POA: Diagnosis not present

## 2022-05-10 DIAGNOSIS — I160 Hypertensive urgency: Secondary | ICD-10-CM | POA: Diagnosis not present

## 2022-05-10 DIAGNOSIS — Z885 Allergy status to narcotic agent status: Secondary | ICD-10-CM

## 2022-05-10 DIAGNOSIS — R6889 Other general symptoms and signs: Secondary | ICD-10-CM | POA: Diagnosis not present

## 2022-05-10 DIAGNOSIS — E785 Hyperlipidemia, unspecified: Secondary | ICD-10-CM | POA: Diagnosis present

## 2022-05-10 DIAGNOSIS — Z91041 Radiographic dye allergy status: Secondary | ICD-10-CM

## 2022-05-10 DIAGNOSIS — K219 Gastro-esophageal reflux disease without esophagitis: Secondary | ICD-10-CM | POA: Diagnosis present

## 2022-05-10 DIAGNOSIS — I455 Other specified heart block: Secondary | ICD-10-CM | POA: Diagnosis not present

## 2022-05-10 DIAGNOSIS — J479 Bronchiectasis, uncomplicated: Secondary | ICD-10-CM | POA: Diagnosis present

## 2022-05-10 DIAGNOSIS — I6529 Occlusion and stenosis of unspecified carotid artery: Secondary | ICD-10-CM | POA: Diagnosis present

## 2022-05-10 DIAGNOSIS — Z7902 Long term (current) use of antithrombotics/antiplatelets: Secondary | ICD-10-CM

## 2022-05-10 DIAGNOSIS — R4182 Altered mental status, unspecified: Secondary | ICD-10-CM | POA: Diagnosis not present

## 2022-05-10 DIAGNOSIS — Z888 Allergy status to other drugs, medicaments and biological substances status: Secondary | ICD-10-CM

## 2022-05-10 DIAGNOSIS — I5030 Unspecified diastolic (congestive) heart failure: Secondary | ICD-10-CM | POA: Diagnosis present

## 2022-05-10 DIAGNOSIS — I69320 Aphasia following cerebral infarction: Secondary | ICD-10-CM

## 2022-05-10 DIAGNOSIS — Z8249 Family history of ischemic heart disease and other diseases of the circulatory system: Secondary | ICD-10-CM

## 2022-05-10 LAB — COMPREHENSIVE METABOLIC PANEL
ALT: 30 U/L (ref 0–44)
AST: 53 U/L — ABNORMAL HIGH (ref 15–41)
Albumin: 4.3 g/dL (ref 3.5–5.0)
Alkaline Phosphatase: 53 U/L (ref 38–126)
Anion gap: 7 (ref 5–15)
BUN: 19 mg/dL (ref 8–23)
CO2: 28 mmol/L (ref 22–32)
Calcium: 10 mg/dL (ref 8.9–10.3)
Chloride: 98 mmol/L (ref 98–111)
Creatinine, Ser: 0.96 mg/dL (ref 0.44–1.00)
GFR, Estimated: 58 mL/min — ABNORMAL LOW (ref >60)
Glucose, Bld: 92 mg/dL (ref 70–99)
Potassium: 4.1 mmol/L (ref 3.5–5.1)
Sodium: 133 mmol/L — ABNORMAL LOW (ref 135–145)
Total Bilirubin: 1.5 mg/dL — ABNORMAL HIGH (ref 0.3–1.2)
Total Protein: 7.7 g/dL (ref 6.5–8.1)

## 2022-05-10 LAB — CBC WITH DIFFERENTIAL/PLATELET
Abs Immature Granulocytes: 0.03 10*3/uL (ref 0.00–0.07)
Basophils Absolute: 0 10*3/uL (ref 0.0–0.1)
Basophils Relative: 1 %
Eosinophils Absolute: 0 10*3/uL (ref 0.0–0.5)
Eosinophils Relative: 1 %
HCT: 34.2 % — ABNORMAL LOW (ref 36.0–46.0)
Hemoglobin: 10.8 g/dL — ABNORMAL LOW (ref 12.0–15.0)
Immature Granulocytes: 0 %
Lymphocytes Relative: 22 %
Lymphs Abs: 1.8 10*3/uL (ref 0.7–4.0)
MCH: 29 pg (ref 26.0–34.0)
MCHC: 31.6 g/dL (ref 30.0–36.0)
MCV: 91.7 fL (ref 80.0–100.0)
Monocytes Absolute: 0.4 10*3/uL (ref 0.1–1.0)
Monocytes Relative: 5 %
Neutro Abs: 5.7 10*3/uL (ref 1.7–7.7)
Neutrophils Relative %: 71 %
Platelets: 280 10*3/uL (ref 150–400)
RBC: 3.73 MIL/uL — ABNORMAL LOW (ref 3.87–5.11)
RDW: 14.5 % (ref 11.5–15.5)
WBC: 8 10*3/uL (ref 4.0–10.5)
nRBC: 0 % (ref 0.0–0.2)

## 2022-05-10 LAB — TSH: TSH: 0.623 u[IU]/mL (ref 0.350–4.500)

## 2022-05-10 LAB — AMMONIA: Ammonia: 19 umol/L (ref 9–35)

## 2022-05-10 LAB — CBG MONITORING, ED: Glucose-Capillary: 80 mg/dL (ref 70–99)

## 2022-05-10 MED ORDER — LISINOPRIL 2.5 MG PO TABS
1.2500 mg | ORAL_TABLET | Freq: Every day | ORAL | Status: DC
  Administered 2022-05-11 – 2022-05-15 (×5): 1.25 mg via ORAL
  Filled 2022-05-10 (×3): qty 1
  Filled 2022-05-10: qty 0.5
  Filled 2022-05-10 (×2): qty 1

## 2022-05-10 MED ORDER — ROSUVASTATIN CALCIUM 20 MG PO TABS
20.0000 mg | ORAL_TABLET | Freq: Every day | ORAL | Status: DC
  Administered 2022-05-11 – 2022-05-15 (×5): 20 mg via ORAL
  Filled 2022-05-10 (×5): qty 1

## 2022-05-10 MED ORDER — ESCITALOPRAM OXALATE 10 MG PO TABS
5.0000 mg | ORAL_TABLET | Freq: Every day | ORAL | Status: DC
  Administered 2022-05-10 – 2022-05-15 (×6): 5 mg via ORAL
  Filled 2022-05-10 (×6): qty 1

## 2022-05-10 MED ORDER — METOPROLOL TARTRATE 25 MG PO TABS
25.0000 mg | ORAL_TABLET | Freq: Two times a day (BID) | ORAL | Status: DC
  Administered 2022-05-10 – 2022-05-11 (×2): 25 mg via ORAL
  Filled 2022-05-10 (×2): qty 1

## 2022-05-10 MED ORDER — FUROSEMIDE 20 MG PO TABS
10.0000 mg | ORAL_TABLET | Freq: Two times a day (BID) | ORAL | Status: DC
  Administered 2022-05-10 – 2022-05-15 (×9): 10 mg via ORAL
  Filled 2022-05-10 (×10): qty 1

## 2022-05-10 MED ORDER — DABIGATRAN ETEXILATE MESYLATE 150 MG PO CAPS
150.0000 mg | ORAL_CAPSULE | Freq: Two times a day (BID) | ORAL | Status: DC
  Administered 2022-05-10 – 2022-05-15 (×10): 150 mg via ORAL
  Filled 2022-05-10 (×10): qty 1

## 2022-05-10 MED ORDER — MONTELUKAST SODIUM 10 MG PO TABS
10.0000 mg | ORAL_TABLET | Freq: Every day | ORAL | Status: DC
  Administered 2022-05-11 – 2022-05-14 (×4): 10 mg via ORAL
  Filled 2022-05-10 (×4): qty 1

## 2022-05-10 MED ORDER — MELATONIN 5 MG PO TABS
5.0000 mg | ORAL_TABLET | Freq: Every day | ORAL | Status: DC
  Administered 2022-05-10 – 2022-05-14 (×5): 5 mg via ORAL
  Filled 2022-05-10 (×5): qty 1

## 2022-05-10 NOTE — ED Notes (Signed)
Pt provided soup and crackers, per request. Dr. Tyrone Nine agreed.

## 2022-05-10 NOTE — Progress Notes (Addendum)
ON CALL PHONE CONSULT  Call from Dr Tyrone Nine @ [facility]   Discussion Patient came into the emergency room with complaints of dizziness off and on for multiple days, today leading to a unwitnessed fall.  On anticoagulation for A-fib.  Recent left MCA stroke with residual right-sided weakness and aphasia.  Unclear description of these episodes but some family member concern for "zoning out" and feeling of patient "not being present"-raises concern for possible ongoing seizure activity given recent cortical strokes. I have recommended admission for closer neuro monitoring, MRI brain without contrast, routine EEG followed by LTM EEG for at least 24 hours to see if any spells can be captured and labs etc. CT head and CT C-spine unremarkable at [facility]. She can be admitted to the hospitalist at [facility] and the onsite neurological team at [facility] should be called for formal consultation once the patient arrives there.   -- Amie Portland, MD Neurologist [facility] Pager: [phone]

## 2022-05-10 NOTE — ED Provider Notes (Addendum)
[facility] EMERGENCY DEPT Provider Note   CSN: [id number] Arrival date & time: 05/10/22  1416     History  Chief Complaint  Patient presents with   Lytle Michaels    Barbara Mcconnell is a 87 y.o. female.  87 yo F with a chief complaint of a fall.  The patient has been having periods of unsteadiness ever since Barbara Mcconnell had a stroke.  Barbara Mcconnell had 1 of these episodes and had lost her balance and struck her face against a column.  Barbara Mcconnell feels a bit better now.  Noticed that Barbara Mcconnell had some worsening periorbital swelling and so came to the emergency room for evaluation.  Denies any change to her vision.  Has been unsteady off and on.  Barbara Mcconnell thinks maybe it is a medication change.  Barbara Mcconnell denies chest pain denies difficulty breathing.  Has had a little cough over the past couple days.  No fevers no abdominal pain no urinary symptoms.   Fall       Home Medications Prior to Admission medications   Medication Sig Start Date End Date Taking? Authorizing Provider  acetaminophen (TYLENOL) 500 MG tablet Take 500-1,000 mg by mouth every 6 (six) hours as needed for moderate pain.    [provider]  albuterol (VENTOLIN HFA) 108 (90 Base) MCG/ACT inhaler Inhale 2 puffs into the lungs every 4 (four) hours as needed for wheezing or shortness of breath (seasonal allergies). 02/04/19   [provider]  Cholecalciferol (VITAMIN D3 PO) Take 1,000 Units by mouth daily.    [provider]  dabigatran (PRADAXA) 150 MG CAPS capsule Take 1 capsule (150 mg total) by mouth every 12 (twelve) hours. 03/30/22   Rosalin Hawking, MD  escitalopram (LEXAPRO) 5 MG tablet Take 1 tablet (5 mg total) by mouth daily. 09/15/21   Wardell Honour, MD  fexofenadine (ALLEGRA) 60 MG tablet Take 60 mg by mouth daily.    [provider]  furosemide (LASIX) 20 MG tablet Take 0.5 tablets (10 mg total) by mouth 2 (two) times daily. 08/04/21   Kathyrn Drown D, NP  lisinopril (ZESTRIL) 5 MG tablet Take 1.25 mg by mouth  daily.    [provider]  melatonin 5 MG TABS Take 5 mg by mouth at bedtime.    [provider]  metoprolol tartrate (LOPRESSOR) 25 MG tablet TAKE ONE TABLET BY MOUTH TWICE A DAY 02/08/22   Kathyrn Drown D, NP  montelukast (SINGULAIR) 10 MG tablet Take 1 tablet (10 mg total) by mouth daily after supper. 11/16/21   Wardell Honour, MD  Multiple Vitamin (MULTIVITAMIN WITH MINERALS) TABS tablet Take 1 tablet by mouth daily. Centrum 50 plus    [provider]  Multiple Vitamins-Minerals (EMERGEN-C VITAMIN C PO) Take 1 packet by mouth daily. Mix with water    [provider]  omeprazole (PRILOSEC) 20 MG capsule Take 20 mg by mouth daily before breakfast. 03/19/19   [provider]  rosuvastatin (CRESTOR) 20 MG tablet Take 1 tablet (20 mg total) by mouth daily. 03/31/22   Rosalin Hawking, MD  Trolamine Salicylate (ASPERCREME EX) Apply 1 application topically daily as needed (knee pain).    [provider]      Allergies    Codeine, Cyclobenzaprine, Iodinated contrast media, and Prednisone    Review of Systems   Review of Systems  Physical Exam Updated Vital Signs BP (!) 178/73 (BP Location: Right Arm)   Pulse 81   Temp 98.2 F (36.8 C) (Oral)  Resp (!) 25   SpO2 95%  Physical Exam Vitals and nursing note reviewed.  Constitutional:      General: Barbara Mcconnell is not in acute distress.    Appearance: Barbara Mcconnell is well-developed. Barbara Mcconnell is not diaphoretic.  HENT:     Head: Normocephalic and atraumatic.  Eyes:     Pupils: Pupils are equal, round, and reactive to light.  Cardiovascular:     Rate and Rhythm: Normal rate and regular rhythm.     Heart sounds: No murmur heard.    No friction rub. No gallop.  Pulmonary:     Effort: Pulmonary effort is normal.     Breath sounds: No wheezing or rales.  Abdominal:     General: There is no distension.     Palpations: Abdomen is soft.     Tenderness: There is no abdominal tenderness.  Musculoskeletal:         General: No tenderness.     Cervical back: Normal range of motion and neck supple.  Skin:    General: Skin is warm and dry.  Neurological:     Mental Status: Barbara Mcconnell is alert and oriented to person, place, and time.     Cranial Nerves: Cranial nerves 2-12 are intact.     Sensory: Sensation is intact.     Motor: Motor function is intact.     Coordination: Coordination is intact.     Comments: Some difficulty with finger-to-nose with the right upper extremity.  Not appreciated with the left upper extremity.  Some past-pointing.  Initially had some trouble with tongue movement but also had had difficulty with her dentures was repeated and appeared normal.  Psychiatric:        Behavior: Behavior normal.    ED Results / Procedures / Treatments   Labs (all labs ordered are listed, but only abnormal results are displayed) Labs Reviewed  CBC WITH DIFFERENTIAL/PLATELET - Abnormal; Notable for the following components:      Result Value   RBC 3.73 (*)    Hemoglobin 10.8 (*)    HCT 34.2 (*)    All other components within normal limits  COMPREHENSIVE METABOLIC PANEL - Abnormal; Notable for the following components:   Sodium 133 (*)    AST 53 (*)    Total Bilirubin 1.5 (*)    GFR, Estimated 58 (*)    All other components within normal limits  URINE CULTURE  AMMONIA  TSH  URINALYSIS, ROUTINE W REFLEX MICROSCOPIC  CBG MONITORING, ED    EKG EKG Interpretation  Date/Time:  Tuesday May 10 2022 15:31:20 EDT Ventricular Rate:  77 PR Interval:  150 QRS Duration: 96 QT Interval:  384 QTC Calculation: 434 R Axis:   13 Text Interpretation: Normal sinus rhythm Possible Left atrial enlargement Minimal voltage criteria for LVH, may be normal variant ( Cornell product ) Cannot rule out Inferior infarct , age undetermined Abnormal ECG No significant change since last tracing Confirmed by Melene PlanFloyd, Golden Emile [id number]) on 05/10/2022 3:43:59 PM  Radiology CT MAXILLOFACIAL WO CONTRAST  Result Date:  05/10/2022 CLINICAL DATA:  Trauma, fall EXAM: CT MAXILLOFACIAL WITHOUT CONTRAST TECHNIQUE: Multidetector CT imaging of the maxillofacial structures was performed. Multiplanar CT image reconstructions were also generated. RADIATION DOSE REDUCTION: This exam was performed according to the departmental dose-optimization program which includes automated exposure control, adjustment of the mA and/or kV according to patient size and/or use of iterative reconstruction technique. COMPARISON:  None Available. FINDINGS: Osseous: Fracture is seen in the left ala of nasal bones with  1 mm offset in alignment. No other fractures are seen. Degenerative changes are noted in left TMJ. Orbits: Optic globes appear symmetrical. Retrobulbar soft tissues are unremarkable. There is no demonstrable blowout fracture in the orbits. Sinuses: There are no air-fluid levels or significant mucosal thickening. Soft tissues: There is large subcutaneous hematoma in the left periorbital region extending to the left frontal scalp. Limited intracranial: Unremarkable. IMPRESSION: There is slightly displaced fracture in the left ala of nasal bones. No other fractures are seen in the facial bones. Significant degenerative changes are noted in left TMJ. Optic globes and retrobulbar soft tissues are unremarkable. There are no air-fluid levels or significant mucosal thickening in paranasal sinuses. Electronically Signed   By: Ernie Avena M.D.   On: 05/10/2022 16:49   CT Cervical Spine Wo Contrast  Result Date: 05/10/2022 CLINICAL DATA:  Trauma, fall EXAM: CT CERVICAL SPINE WITHOUT CONTRAST TECHNIQUE: Multidetector CT imaging of the cervical spine was performed without intravenous contrast. Multiplanar CT image reconstructions were also generated. RADIATION DOSE REDUCTION: This exam was performed according to the departmental dose-optimization program which includes automated exposure control, adjustment of the mA and/or kV according to patient  size and/or use of iterative reconstruction technique. COMPARISON:  Trauma, fall FINDINGS: Alignment: There is mild anterolisthesis at C2-C3 and C3-C4 levels, possibly suggesting previous ligament injury and facet degeneration. There is mild dextroscoliosis. Skull base and vertebrae: No recent fracture is seen. Degenerative changes are noted. Soft tissues and spinal canal: There is extrinsic pressure over the ventral margin of thecal sac caused by posterior bony spurs and bulging of annulus at multiple levels. Disc levels: There is encroachment of neural foramina by bony spurs and facet hypertrophy from C2 to C7 levels. Upper chest: Pleural thickening is seen in the apices. Few blebs are noted. There is peribronchial thickening, possibly suggesting bronchitis. Other: There is inhomogeneous attenuation in thyroid. IMPRESSION: No recent fracture is seen in cervical spine. Cervical spondylosis with encroachment of neural foramina from C2-C7 levels. Electronically Signed   By: Ernie Avena M.D.   On: 05/10/2022 16:42   CT Head Wo Contrast  Result Date: 05/10/2022 CLINICAL DATA:  Trauma, fall EXAM: CT HEAD WITHOUT CONTRAST TECHNIQUE: Contiguous axial images were obtained from the base of the skull through the vertex without intravenous contrast. RADIATION DOSE REDUCTION: This exam was performed according to the departmental dose-optimization program which includes automated exposure control, adjustment of the mA and/or kV according to patient size and/or use of iterative reconstruction technique. COMPARISON:  Previous CT done on 03/25/2022 and MR brain done on 03/26/2022 FINDINGS: Brain: There are no signs of intracranial bleeding. There is a 3 cm area of low attenuation corresponding to the acute infarct seen in the left parietal lobe in the previous MRI. No other new focal abnormalities are noted. Cortical sulci are prominent. Vascular: Unremarkable. Skull: There is subcutaneous hematoma in the left frontal  scalp and left periorbital region. Sinuses/Orbits: There are no air-fluid levels in the paranasal sinuses. Other: None. IMPRESSION: No acute intracranial findings are seen. 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 Atrophy. There is left frontal scalp hematoma. No fracture is seen in calvarium. Electronically Signed   By: Ernie Avena M.D.   On: 05/10/2022 16:36   DG Chest 2 View  Result Date: 05/10/2022 CLINICAL DATA:  Fall, altered mental status EXAM: CHEST - 2 VIEW COMPARISON:  Radiograph 111 20,023, CT chest 07/24/2021 FINDINGS: Normal cardiac silhouette. No pneumothorax. No rib fracture identified. Increased nodular density  in the RIGHT mid lung compared to prior radiograph. Interval reduction in RIGHT pleural effusion and LEFT pleural effusion. IMPRESSION: 1. Increased nodular density in the RIGHT mid lung could represent infection, neoplasm, or increased pleural thickening seen on comparison CT. 2. Improvement bilateral pleural effusions. 3. No evidence of thoracic trauma. Electronically Signed   By: Genevive Bi M.D.   On: 05/10/2022 16:02    Procedures Procedures    Medications Ordered in ED Medications  dabigatran (PRADAXA) capsule 150 mg (has no administration in time range)  escitalopram (LEXAPRO) tablet 5 mg (has no administration in time range)  furosemide (LASIX) tablet 10 mg (has no administration in time range)  lisinopril (ZESTRIL) tablet 1.25 mg (has no administration in time range)  melatonin tablet 5 mg (has no administration in time range)  metoprolol tartrate (LOPRESSOR) tablet 25 mg (has no administration in time range)  rosuvastatin (CRESTOR) tablet 20 mg (has no administration in time range)  montelukast (SINGULAIR) tablet 10 mg (has no administration in time range)    ED Course/ Medical Decision Making/ A&P                           Medical Decision Making Risk OTC drugs. Prescription drug management. Decision regarding  hospitalization.   87 yo F with a chief complaints of periods where Barbara Mcconnell feels unsteady.  This seems to have happened off and on since Barbara Mcconnell had had a stroke.  Barbara Mcconnell was admitted about a month and a half ago.  Had a similar sensation when Barbara Mcconnell was diagnosed with a stroke.  Has been followed up by both neurology and vascular surgery since.  They received a call from their vascular surgeon was some concern on their review of the imaging.  CT scan of the head face and C-spine independently interpreted by me without obvious fracture or intracranial hemorrhage.  Chest x-ray independently interpreted by me without obvious focal infiltrate.  Lab work largely at baseline.  Barbara Mcconnell has some right upper extremity coordination deficits.  Otherwise I do not appreciate any significant finding on neurologic exam.  On my review of patient records this seems to be consistent with prior examination.  We will discuss the case with neurology.  I discussed case with Dr. Wilford Corner, neurology concerned about possible seizures versus recurrent stroke recommended MRI of the brain as well as EEG.  Admission to [facility].  We have contacted bed placement repeatedly and unfortunately there are no beds reported to be available between now and tomorrow morning.  The patient is unable to get her Pradaxa and is unable to get an MRI or started with an EEG or to have bedside evaluation by neurology.  I discussed this with Dr. Dalene Seltzer who accepts the patient in the ED to ED transfer.    The patients results and plan were reviewed and discussed.   Any x-rays performed were independently reviewed by myself.   Differential diagnosis were considered with the presenting HPI.  Medications  dabigatran (PRADAXA) capsule 150 mg (has no administration in time range)  escitalopram (LEXAPRO) tablet 5 mg (has no administration in time range)  furosemide (LASIX) tablet 10 mg (has no administration in time range)  lisinopril (ZESTRIL) tablet 1.25 mg (has  no administration in time range)  melatonin tablet 5 mg (has no administration in time range)  metoprolol tartrate (LOPRESSOR) tablet 25 mg (has no administration in time range)  rosuvastatin (CRESTOR) tablet 20 mg (has no administration in time range)  montelukast (SINGULAIR) tablet 10 mg (has no administration in time range)    Vitals:   05/10/22 1514 05/10/22 1700  BP: (!) 162/85 (!) 178/73  Pulse: 78 81  Resp: 16 (!) 25  Temp: 98.2 F (36.8 C)   TempSrc: Oral   SpO2: 98% 95%    Final diagnoses:  Spells of decreased attentiveness  Fall, initial encounter  Closed fracture of nasal bone, initial encounter    Admission/ observation were discussed with the admitting physician, patient and/or family and they are comfortable with the plan.          Final Clinical Impression(s) / ED Diagnoses Final diagnoses:  Spells of decreased attentiveness  Fall, initial encounter  Closed fracture of nasal bone, initial encounter    Rx / DC Orders ED Discharge Orders     None          Melene Plan, DO 05/10/22 2323

## 2022-05-10 NOTE — ED Notes (Signed)
Caregiver daughter, Earnest Bailey, voiced concerns regarding medication regimen and continuation of care. Was informed of pending MRI and studies. Discussed with daughter that patient is currently awaiting a bed assignment at [facility], with Tele neuro available if new concerns, and reassured an emergency department physician Dr. Tyrone Nine to continue to manage patients care. Discussed caregivers concerns with EDP Dr.Floyd.

## 2022-05-10 NOTE — ED Notes (Signed)
Pt arrived to ED

## 2022-05-10 NOTE — Telephone Encounter (Signed)
Message routed to PCP Miller, Stephen M, MD  ?

## 2022-05-10 NOTE — ED Triage Notes (Signed)
Unwitnessed Fall on pradaxa.  Hit head on upright column. Denies LOC. Happened around late morning. Unknown last known normal. Per daughter patient is more confused then normal

## 2022-05-11 ENCOUNTER — Inpatient Hospital Stay (HOSPITAL_COMMUNITY): Payer: Medicare HMO

## 2022-05-11 ENCOUNTER — Encounter (HOSPITAL_COMMUNITY): Payer: Self-pay | Admitting: Internal Medicine

## 2022-05-11 ENCOUNTER — Encounter: Payer: Self-pay | Admitting: Vascular Surgery

## 2022-05-11 DIAGNOSIS — I5030 Unspecified diastolic (congestive) heart failure: Secondary | ICD-10-CM | POA: Diagnosis present

## 2022-05-11 DIAGNOSIS — I6523 Occlusion and stenosis of bilateral carotid arteries: Secondary | ICD-10-CM | POA: Diagnosis not present

## 2022-05-11 DIAGNOSIS — W19XXXA Unspecified fall, initial encounter: Secondary | ICD-10-CM

## 2022-05-11 DIAGNOSIS — E871 Hypo-osmolality and hyponatremia: Secondary | ICD-10-CM

## 2022-05-11 DIAGNOSIS — I480 Paroxysmal atrial fibrillation: Secondary | ICD-10-CM

## 2022-05-11 DIAGNOSIS — R42 Dizziness and giddiness: Secondary | ICD-10-CM

## 2022-05-11 DIAGNOSIS — I6930 Unspecified sequelae of cerebral infarction: Secondary | ICD-10-CM

## 2022-05-11 DIAGNOSIS — E785 Hyperlipidemia, unspecified: Secondary | ICD-10-CM

## 2022-05-11 DIAGNOSIS — I160 Hypertensive urgency: Secondary | ICD-10-CM | POA: Diagnosis present

## 2022-05-11 DIAGNOSIS — R748 Abnormal levels of other serum enzymes: Secondary | ICD-10-CM | POA: Diagnosis present

## 2022-05-11 DIAGNOSIS — R569 Unspecified convulsions: Secondary | ICD-10-CM | POA: Diagnosis not present

## 2022-05-11 DIAGNOSIS — I5032 Chronic diastolic (congestive) heart failure: Secondary | ICD-10-CM | POA: Diagnosis not present

## 2022-05-11 DIAGNOSIS — Z952 Presence of prosthetic heart valve: Secondary | ICD-10-CM

## 2022-05-11 DIAGNOSIS — F419 Anxiety disorder, unspecified: Secondary | ICD-10-CM | POA: Diagnosis not present

## 2022-05-11 DIAGNOSIS — R6889 Other general symptoms and signs: Principal | ICD-10-CM | POA: Insufficient documentation

## 2022-05-11 DIAGNOSIS — S022XXA Fracture of nasal bones, initial encounter for closed fracture: Secondary | ICD-10-CM | POA: Diagnosis present

## 2022-05-11 DIAGNOSIS — I951 Orthostatic hypotension: Secondary | ICD-10-CM | POA: Diagnosis present

## 2022-05-11 DIAGNOSIS — R9389 Abnormal findings on diagnostic imaging of other specified body structures: Secondary | ICD-10-CM | POA: Diagnosis not present

## 2022-05-11 LAB — COMPREHENSIVE METABOLIC PANEL
ALT: 26 U/L (ref 0–44)
AST: 50 U/L — ABNORMAL HIGH (ref 15–41)
Albumin: 3.3 g/dL — ABNORMAL LOW (ref 3.5–5.0)
Alkaline Phosphatase: 47 U/L (ref 38–126)
Anion gap: 7 (ref 5–15)
BUN: 12 mg/dL (ref 8–23)
CO2: 27 mmol/L (ref 22–32)
Calcium: 9.1 mg/dL (ref 8.9–10.3)
Chloride: 100 mmol/L (ref 98–111)
Creatinine, Ser: 0.99 mg/dL (ref 0.44–1.00)
GFR, Estimated: 56 mL/min — ABNORMAL LOW (ref >60)
Glucose, Bld: 158 mg/dL — ABNORMAL HIGH (ref 70–99)
Potassium: 3.5 mmol/L (ref 3.5–5.1)
Sodium: 134 mmol/L — ABNORMAL LOW (ref 135–145)
Total Bilirubin: 1.7 mg/dL — ABNORMAL HIGH (ref 0.3–1.2)
Total Protein: 6.1 g/dL — ABNORMAL LOW (ref 6.5–8.1)

## 2022-05-11 LAB — PROCALCITONIN: Procalcitonin: <0.1 ng/mL

## 2022-05-11 MED ORDER — ALBUTEROL SULFATE (2.5 MG/3ML) 0.083% IN NEBU: 2.5000 mg | INHALATION_SOLUTION | Freq: Four times a day (QID) | RESPIRATORY_TRACT | Status: DC | PRN

## 2022-05-11 MED ORDER — DOCUSATE SODIUM 100 MG PO CAPS: 100.0000 mg | ORAL_CAPSULE | Freq: Two times a day (BID) | ORAL | Status: DC | PRN

## 2022-05-11 MED ORDER — LORATADINE 10 MG PO TABS
10.0000 mg | ORAL_TABLET | Freq: Every day | ORAL | Status: DC
  Administered 2022-05-11 – 2022-05-15 (×5): 10 mg via ORAL
  Filled 2022-05-11 (×5): qty 1

## 2022-05-11 MED ORDER — SODIUM CHLORIDE 0.9% FLUSH
3.0000 mL | Freq: Two times a day (BID) | INTRAVENOUS | Status: DC
  Administered 2022-05-11 – 2022-05-15 (×9): 3 mL via INTRAVENOUS

## 2022-05-11 MED ORDER — ACETAMINOPHEN 325 MG PO TABS
650.0000 mg | ORAL_TABLET | Freq: Four times a day (QID) | ORAL | Status: DC | PRN
  Administered 2022-05-12 – 2022-05-13 (×3): 650 mg via ORAL
  Administered 2022-05-14: 325 mg via ORAL
  Filled 2022-05-11 (×5): qty 2

## 2022-05-11 MED ORDER — ONDANSETRON HCL 4 MG PO TABS
4.0000 mg | ORAL_TABLET | Freq: Four times a day (QID) | ORAL | Status: DC | PRN
  Filled 2022-05-11: qty 1

## 2022-05-11 MED ORDER — LIDOCAINE 5 % EX PTCH
2.0000 | MEDICATED_PATCH | CUTANEOUS | Status: DC
  Administered 2022-05-11 – 2022-05-14 (×4): 2 via TRANSDERMAL
  Filled 2022-05-11 (×4): qty 2

## 2022-05-11 MED ORDER — ONDANSETRON HCL 4 MG/2ML IJ SOLN: 4.0000 mg | Freq: Four times a day (QID) | INTRAMUSCULAR | Status: DC | PRN

## 2022-05-11 MED ORDER — GUAIFENESIN ER 600 MG PO TB12
600.0000 mg | ORAL_TABLET | Freq: Two times a day (BID) | ORAL | Status: DC
  Administered 2022-05-11 – 2022-05-15 (×5): 600 mg via ORAL
  Filled 2022-05-11 (×7): qty 1

## 2022-05-11 MED ORDER — ACETAMINOPHEN 650 MG RE SUPP: 650.0000 mg | Freq: Four times a day (QID) | RECTAL | Status: DC | PRN

## 2022-05-11 MED ORDER — FAMOTIDINE 10 MG PO TABS
10.0000 mg | ORAL_TABLET | Freq: Every day | ORAL | Status: DC | PRN
  Administered 2022-05-11: 10 mg via ORAL
  Filled 2022-05-11: qty 1

## 2022-05-11 NOTE — ED Notes (Signed)
Breakfast tray received.

## 2022-05-11 NOTE — H&P (Addendum)
History and Physical    Patient: Barbara Mcconnell [id number] DOB: 09/01/1934 DOA: 05/10/2022 DOS: the patient was seen and examined on 05/11/2022 PCP: Wardell Honour, MD  Patient coming from: Home  Chief Complaint:  Chief Complaint  Patient presents with   Fall   HPI: Barbara Mcconnell is a 87 y.o. female with medical history significant of history HTN, HLD, CAD, afib on eliquis, s/p TAVR, takotsubo cardiomyopathy, heart murmur, CVA, anxiety, arthritis, and   GERD presents after having a fall.  She is obtained with assistance of the patient's daughter who is present at bedside. Recently hospitalized 9/1-9/6 after being found to have a left MCA infarct with left M2 occlusion status post interventional radiology intervention with reconstitution of left M3, embolic secondary to A-fib despite being on Eliquis.  Yesterday patient had been using her walker when she ran into a wheelchair losing her balance striking her head into a column.  Initially she sustained a abrasion of the upper left brow, but quickly developed bruising that drained from the eyebrow down into the lower upper left cheek.  Daughter notes that ever since her stroke in rehab she has had these intermittent episodes where she seems like she is out of it that patient described as dizziness.  Patient states it takes everything she has to keep it together.  Sometimes these episodes are brief and other times they are prolonged lasting 15 to 20 minutes.  For the most part episodes have all occurred while up and usually seem to get better when she sitting down.  Daughter notes that she ran into TV a couple weeks ago and hit the opposite side of her head.  At rehab she was also noted to have these intermittent episodes of confusion her daughter reports.  Furthermore she states that she has intermittently had a dry cough and some lower extremity swelling.  Daughter notes that at her home her systolic blood pressures have been very high at times and  low at others.     Upon admission into the emergency department patient was noted to be afebrile with tachypnea, blood pressures elevated up to 192/86, and all other vital signs maintained.  Labs from 10/17 noted able hemoglobin 10.8, sodium 133, AST 53, and total bilirubin 1.5.  Increased nodular density of the right midlung concerning for infection, neoplasm is elevated.  CT scan of the head and cervical spine did not note any acute abnormalities, but did show old large left MCA stroke and cervical spondylitis with encroachment of the neural foramina from C2-C7.  Maxillofacial CT noted slightly displaced fracture of the left ala of nasal bones.  Neurology had been consulted and recommended admission for need of MRI and EEG monitoring.  review of Systems: As mentioned in the history of present illness. All other systems reviewed and are negative. 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 Ali Lowe & Dr. Cyndia Bent   Seasonal allergies  Takotsubo cardiomyopathy    Past Surgical History:  Procedure Laterality Date   COLONOSCOPY     EYE SURGERY Bilateral    cataracts removed   INTRAOPERATIVE TRANSTHORACIC ECHOCARDIOGRAM N/A 08/03/2021   Procedure: INTRAOPERATIVE TRANSTHORACIC ECHOCARDIOGRAM;  Surgeon: Early Osmond, MD;  Location: [facility];  Service: Open Heart Surgery;  Laterality: N/A;   IR CT HEAD LTD  03/25/2022   IR PERCUTANEOUS ART THROMBECTOMY/INFUSION INTRACRANIAL INC DIAG ANGIO  03/25/2022   IR US GUIDE VASC ACCESS RIGHT   03/25/2022   PERICARDIOCENTESIS N/A 07/28/2021   Procedure: PERICARDIOCENTESIS;  Surgeon: Early Osmond, MD;  Location: [facility];  Service: Cardiovascular;  Laterality: N/A;   RADIOLOGY WITH ANESTHESIA N/A 03/25/2022   Procedure: IR WITH ANESTHESIA;  Surgeon: Radiologist, Medication, MD;  Location: [facility];  Service: Radiology;  Laterality: N/A;   RIGHT/LEFT HEART CATH AND CORONARY ANGIOGRAPHY N/A 05/13/2019   Procedure: RIGHT/LEFT HEART CATH AND CORONARY ANGIOGRAPHY;  Surgeon: Martinique, Peter M, MD;  Location: [facility];  Service: Cardiovascular;  Laterality: N/A;   RIGHT/LEFT HEART CATH AND CORONARY ANGIOGRAPHY N/A 07/23/2021   Procedure: RIGHT/LEFT HEART CATH AND CORONARY ANGIOGRAPHY;  Surgeon: Jettie Booze, MD;  Location: [facility];  Service: Cardiovascular;  Laterality: N/A;   TONSILLECTOMY     TRANSCATHETER AORTIC VALVE REPLACEMENT, TRANSFEMORAL N/A 08/03/2021   Procedure: TRANSCATHETER AORTIC VALVE REPLACEMENT, TRANSFEMORAL;  Surgeon: Early Osmond, MD;  Location: [facility];  Service: Open Heart Surgery;  Laterality: N/A;   UPPER GI ENDOSCOPY     Social History:  reports that she has quit smoking. Her smoking use included cigarettes. She has a 20.00 pack-year smoking history. She has never used smokeless tobacco. She reports current alcohol use. She reports that she does not use drugs.  Allergies  Allergen Reactions   Codeine Nausea And Vomiting   Cyclobenzaprine Other (See Comments)    Unknown reaction   Iodinated Contrast Media Nausea Only   Prednisone Other (See Comments)    "Sores in the mouth"    Family History  Problem Relation Age of Onset   Cancer Sister    Lung cancer Sister    Alcohol abuse Sister    Melanoma Brother    Brain cancer Brother    Heart failure Brother    Diabetes Brother    Cancer Brother    Heart Problems Brother    Down syndrome Brother    Heart failure Brother    Endometriosis Daughter    Hypertension  Other     Prior to Admission medications   Medication Sig Start Date End Date Taking? Authorizing Provider  acetaminophen (TYLENOL) 500 MG tablet Take 500-1,000 mg by mouth every 6 (six) hours as needed for moderate pain.   Yes [provider]  albuterol (VENTOLIN HFA) 108 (90 Base) MCG/ACT inhaler Inhale 2 puffs into the lungs every 4 (four) hours as needed for wheezing or shortness of breath (seasonal allergies). 02/04/19  Yes [provider]  Cholecalciferol (VITAMIN D3 PO) Take 1,000 Units by mouth daily.   Yes [provider]  dabigatran (PRADAXA) 150 MG CAPS capsule Take 1 capsule (150 mg total) by mouth every 12 (twelve) hours. 03/30/22  Yes Rosalin Hawking, MD  docusate sodium (COLACE) 100 MG capsule Take 100 mg by mouth 2 (two) times daily. 05/09/22  Yes [provider]  escitalopram (LEXAPRO) 5 MG tablet Take 1 tablet (5 mg total) by mouth daily. Patient taking differently: Take 10  mg by mouth daily. 09/15/21  Yes Wardell Honour, MD  famotidine (PEPCID) 10 MG tablet Take 10 mg by mouth daily as needed for heartburn.   Yes [provider]  fexofenadine (ALLEGRA) 60 MG tablet Take 60 mg by mouth daily.   Yes [provider]  furosemide (LASIX) 20 MG tablet Take 0.5 tablets (10 mg total) by mouth 2 (two) times daily. 08/04/21  Yes Kathyrn Drown D, NP  lisinopril (ZESTRIL) 5 MG tablet Take 1.25 mg by mouth daily.   Yes [provider]  melatonin 5 MG TABS Take 5 mg by mouth at bedtime.   Yes [provider]  metoprolol tartrate (LOPRESSOR) 25 MG tablet TAKE ONE TABLET BY MOUTH TWICE A DAY 02/08/22  Yes Kathyrn Drown D, NP  montelukast (SINGULAIR) 10 MG tablet Take 1 tablet (10 mg total) by mouth daily after supper. 11/16/21  Yes Wardell Honour, MD  Multiple Vitamin (MULTIVITAMIN WITH MINERALS) TABS tablet Take 1 tablet by mouth daily. Centrum 50 plus   Yes [provider]  Multiple Vitamins-Minerals (EMERGEN-C  VITAMIN C PO) Take 1 packet by mouth daily. Mix with water   Yes [provider]  rosuvastatin (CRESTOR) 20 MG tablet Take 1 tablet (20 mg total) by mouth daily. 03/31/22  Yes Rosalin Hawking, MD  Trolamine Salicylate (ASPERCREME EX) Apply 1 application topically daily as needed (knee pain).   Yes [provider]    Physical Exam: Vitals:   05/11/22 0400 05/11/22 0500 05/11/22 0600 05/11/22 0630  BP: (!) 122/55 (!) 115/55 (!) 116/55 (!) 118/59  Pulse: 70 67 69 69  Resp: 14 19 15 20   Temp: 98.4 F (36.9 C)     TempSrc: Oral     SpO2: 94% 93% 92% 91%     Constitutional: Elderly female currently in no acute distress Eyes: PERRL, periorbital bruising with large hematoma underneath the left eye. ENMT: Mucous membranes are moist.   Neck: normal, supple, no masses, no JVD Respiratory: clear to auscultation bilaterally, no wheezing, no crackles. Normal respiratory effort. No accessory muscle use.  Cardiovascular: Regular rate and rhythm, no murmurs / rubs / gallops.  Trace left ankle edema.  2+ pedal pulses.   Abdomen: no tenderness, no masses palpated.  Bowel sounds positive.  Musculoskeletal: no clubbing . No joint deformity upper and lower extremities. Good ROM, no contractures. Normal muscle tone.  Skin: Bruising appreciated of the left eye as well as left dorsal aspect of the hand. Neurologic: CN 2-12 grossly intact.  Expressive aphasia present.  Strength 5/5 in all 4.  Psychiatric: Normal judgment and insight. Alert and oriented x 3. Normal mood.   Data Reviewed:    Assessment and Plan: Fall secondary to dizziness orthostatic hypotension Acute.  Patient presents after having a fall at home with reports of intermittent episodes of dizziness described as taking her everything that she has to keep it together.  TSH noted to be within normal limits.  CT scan of the head noted prior left MCA stroke.  Suspect symptoms at least in part likely related to orthostatic  hypotension, but question the possibility of seizures given the stroke. -Admit to a telemetry bed -Seizure precautions -Neurochecks -Check orthostatic vital signs(positive with drop in blood pressure from 143/68 with heart rate 67 lying down to 103/74 with heart rate of 44 on standing.) -Check MRI of the brain(did not note any new stroke and continued evolving of prior left MCA stroke. -Follow-up EEG -Follow-up MRI of the cervical spine  ordered by neurology -PT/OT to eval and treat -Appreciate neurology consultative services, we will follow-up for any further recommendations.  Hypertensive urgency On admission blood pressures elevated up to 192/86, but as low as 106/78.  Home blood pressure regimen includes furosemide 10 mg daily, lisinopril 1.25 mg daily(done per patient preference), and metoprolol 25 mg twice daily.  -Will defer to cardiology -Hydralazine IV as needed  Abnormal chest x-ray Patient daughter notes that she has had an intermittent cough.  Right midlung nodular density noted on chest x-ray which could represent infection, neoplasm, or increased pleural thickening seen on prior CT. possibly secondary to patient's history of bronchiectasis. -Check procalcitonin -Recheck CT of the chest  Hyponatremia Acute.  Sodium 133.  Patient is on a diuretic which could be a cause for the low sodium levels. -Continue to monitor  Paroxysmal atrial fibrillation on chronic anticoagulation Patient had just recently had left MCA stroke last month and was switched from Eliquis to Pradaxa. -Continue Pradaxa  Elevated liver enzymes Chronic.  AST 53, but has been elevated in the past along with ALT.  Total bilirubin is acutely elevated at 1.5.  Recent changes include increasing Crestor from 5 mg daily to 20 mg last month which could be a possible factor.  Previously noted to have reactive hep A antibody IgM in 06/2021. -Check hepatitis panel -Recheck LFTs, but will also need to continued  outpatient monitoring  History of CVA with residual deficit Patient suffered left MCA infarct with left M2 occlusion status post interventional radiology intervention with reconstitution of left M3, embolic secondary to A-fib despite being on Eliquis in September.  Patient switched to Pradaxa. -Continue statin and Pradaxa  Heart failure with preserved Chronic.  Patient appears to be euvolemic currently.  EF 65 -70% with indeterminate diastolic parameters on last echocardiogram from 03/26/21.   -Continue to monitor  Nasal bone fracture secondary to fall Patient found to have a slightly displaced fracture of the left ala of nasal bones. -Patient likely can follow-up in the outpatient setting with ENT if needed  Carotid artery disease -Continue statin and Pradaxa  S/p TAVR  Dyslipidemia Home medication regimen includes Crestor 20 mg daily. -Continue Crestor  Anxiety -Continue Lexapro   DVT prophylaxis: Pradaxa Advance Care Planning:   Code Status: DNR.  Daughter present at bedside as a witness.  Consults: Neurology  Family Communication: Daughter update at bedside.  Severity of Illness: The appropriate patient status for this patient is INPATIENT. Inpatient status is judged to be reasonable and necessary in order to provide the required intensity of service to ensure the patient's safety. The patient's presenting symptoms, physical exam findings, and initial radiographic and laboratory data in the context of their chronic comorbidities is felt to place them at high risk for further clinical deterioration. Furthermore, it is not anticipated that the patient will be medically stable for discharge from the hospital within 2 midnights of admission.   * I certify that at the point of admission it is my clinical judgment that the patient will require inpatient hospital care spanning beyond 2 midnights from the point of admission due to high intensity of service, high risk for further  deterioration and high frequency of surveillance required.*  Author: Norval Morton, MD 05/11/2022 7:08 AM  For on call review [email or web address].

## 2022-05-11 NOTE — ED Notes (Signed)
Despite best effort to provide education, patient continually pulling off monitoring equipment and getting up out o bed. Patient has walker at bedside

## 2022-05-11 NOTE — ED Notes (Signed)
Cold pack provided.

## 2022-05-11 NOTE — ED Notes (Signed)
Pt ambulatory to bathroom with stand by assistance. States she was "unable to provide urine sample", only had a bowel movement.

## 2022-05-11 NOTE — Consult Note (Signed)
NEURO HOSPITALIST CONSULT NOTE   Requestig physician: Dr. Verlon Au  Reason for Consult: Episodes of dizziness and other spells of zoning out concerning for seizures  History obtained from:   Chart     HPI:                                                                                                                                          Barbara Mcconnell is an 87 y.o. 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 After the fall, she was noted by her daughter to be more confused than her baseline. She had been having episodes of dizziness as well as other spells of zoning out concerning for seizures.  Unclear description of these episodes but there was concern for "zoning out" and feeling of patient "not being present" which raised concern for possible ongoing seizure activity given recent cortical strokes. She has had no actual witnessed seizures but she had been having unwitnessed falls leading to the most recent fall described above. There was no ICH on head CT. Her case was discussed with Neurology and recommendations included inpatient admission for formal Neurology consultation, MRI and EEG followed by LTM for spell characterization.   ED Course: Na 133, AST 53, total bili 1.5, WBC normal, TSH normal. CT cervical spine with no recent fracture; noted cervical spondylosis with encroachment of neural foramina from C2-C7 levels.  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 Ali Lowe & Dr. Cyndia Bent   Seasonal allergies    Takotsubo cardiomyopathy     Past Surgical History:  Procedure Laterality Date   COLONOSCOPY     EYE SURGERY Bilateral    cataracts removed   INTRAOPERATIVE TRANSTHORACIC ECHOCARDIOGRAM N/A 08/03/2021   Procedure: INTRAOPERATIVE TRANSTHORACIC ECHOCARDIOGRAM;  Surgeon: Early Osmond, MD;  Location: [facility];  Service: Open Heart Surgery;  Laterality: N/A;   IR CT HEAD LTD  03/25/2022   IR PERCUTANEOUS ART  THROMBECTOMY/INFUSION INTRACRANIAL INC DIAG ANGIO  03/25/2022   IR US GUIDE VASC ACCESS RIGHT  03/25/2022   PERICARDIOCENTESIS N/A 07/28/2021   Procedure: PERICARDIOCENTESIS;  Surgeon: Orbie Pyo, MD;  Location: [facility] INVASIVE CV LAB;  Service: Cardiovascular;  Laterality: N/A;   RADIOLOGY WITH ANESTHESIA N/A 03/25/2022   Procedure: IR WITH ANESTHESIA;  Surgeon: Radiologist, Medication, MD;  Location: [facility] OR;  Service: Radiology;  Laterality: N/A;   RIGHT/LEFT HEART CATH AND CORONARY ANGIOGRAPHY N/A 05/13/2019   Procedure: RIGHT/LEFT HEART CATH AND CORONARY ANGIOGRAPHY;  Surgeon: Swaziland, Peter M, MD;  Location: [facility] INVASIVE CV LAB;  Service: Cardiovascular;  Laterality: N/A;   RIGHT/LEFT HEART CATH AND CORONARY ANGIOGRAPHY N/A 07/23/2021   Procedure: RIGHT/LEFT HEART CATH AND CORONARY ANGIOGRAPHY;  Surgeon: Corky Crafts, MD;  Location: [facility] INVASIVE CV LAB;  Service: Cardiovascular;  Laterality: N/A;   TONSILLECTOMY     TRANSCATHETER AORTIC VALVE REPLACEMENT, TRANSFEMORAL N/A 08/03/2021   Procedure: TRANSCATHETER AORTIC VALVE REPLACEMENT, TRANSFEMORAL;  Surgeon: Orbie Pyo, MD;  Location: [facility] INVASIVE CV LAB;  Service: Open Heart Surgery;  Laterality: N/A;   UPPER GI ENDOSCOPY       Family History  Problem Relation Age of Onset   Cancer Sister    Lung cancer Sister    Alcohol abuse Sister    Melanoma Brother    Brain cancer Brother    Heart failure Brother    Diabetes Brother    Cancer Brother    Heart Problems Brother    Down syndrome Brother    Heart failure Brother    Endometriosis Daughter    Hypertension Other              Social History:  reports that she has quit smoking. Her smoking use included cigarettes. She has a 20.00 pack-year smoking history. She has never used smokeless tobacco. She reports current alcohol use. She reports that she does not use drugs.  Allergies  Allergen Reactions   Codeine Nausea And Vomiting   Cyclobenzaprine Other (See Comments)    Unknown reaction   Iodinated Contrast Media Nausea Only   Prednisone Other (See Comments)    "Sores in the mouth"    MEDICATIONS:                                                                                                                      No current facility-administered medications on file prior to encounter.   Current Outpatient Medications on File Prior to Encounter  Medication Sig Dispense Refill   acetaminophen (TYLENOL) 500 MG tablet Take 500-1,000 mg by mouth every 6 (six) hours as needed for moderate pain.     albuterol (VENTOLIN HFA) 108 (90 Base) MCG/ACT inhaler Inhale 2 puffs into the lungs every 4 (four) hours as needed for wheezing or shortness of breath (seasonal allergies).     Cholecalciferol (VITAMIN D3 PO) Take 1,000 Units by mouth daily.     dabigatran (PRADAXA) 150 MG CAPS capsule Take 1 capsule (150 mg  total) by mouth every 12 (twelve) hours. 60 capsule 5   docusate sodium (COLACE) 100 MG capsule Take 100 mg by mouth 2 (two) times daily.     escitalopram (LEXAPRO) 5 MG tablet Take 1 tablet (5 mg total) by mouth daily. (Patient taking differently: Take 10 mg by mouth daily.) 90 tablet 2   famotidine (PEPCID) 10 MG tablet Take 10 mg by mouth daily as needed  for heartburn.     fexofenadine (ALLEGRA) 60 MG tablet Take 60 mg by mouth daily.     furosemide (LASIX) 20 MG tablet Take 0.5 tablets (10 mg total) by mouth 2 (two) times daily. 120 tablet 3   lisinopril (ZESTRIL) 5 MG tablet Take 1.25 mg by mouth daily.     melatonin 5 MG TABS Take 5 mg by mouth at bedtime.     metoprolol tartrate (LOPRESSOR) 25 MG tablet TAKE ONE TABLET BY MOUTH TWICE A DAY 180 tablet 2   montelukast (SINGULAIR) 10 MG tablet Take 1 tablet (10 mg total) by mouth daily after supper. 90 tablet 3   Multiple Vitamin (MULTIVITAMIN WITH MINERALS) TABS tablet Take 1 tablet by mouth daily. Centrum 50 plus     Multiple Vitamins-Minerals (EMERGEN-C VITAMIN C PO) Take 1 packet by mouth daily. Mix with water     rosuvastatin (CRESTOR) 20 MG tablet Take 1 tablet (20 mg total) by mouth daily. 90 tablet 3   Trolamine Salicylate (ASPERCREME EX) Apply 1 application topically daily as needed (knee pain).       ROS:                                                                                                                                       The patient is a poor historian given dysphasia and is unable to provide a reliable ROS.    Blood pressure (!) 177/70, pulse 79, temperature 98.3 F (36.8 C), temperature source Oral, resp. rate 17, SpO2 96 %.   General Examination:                                                                                                       Physical Exam  HEENT-  Normocephalic. Prominent periorbital bruising on the left.   Lungs- Respirations unlabored Extremities- No edema  Neurological Examination Mental Status: Awake and alert.  Has difficulty answering orientation questions due to dysphasia. Frequent phonemic and semantic paraphasias with word-finding deficits. Able to follow all motor commands. When attempting to communicate with longer  sentences, her speech becomes devoid of useful information with a word-salad quality. No dysarthria.   Cranial  Nerves: II: Temporal visual fields intact with no extinction to DSS. PERRL  III,IV, VI: No definite ptosis in the context of left periorbital trauma. EOMI.  V: Temp sensation equal bilaterally  VII: Smile and lNL fold slightly decreased on the left VIII: Hearing intact to voice IX,X: No hoarseness XI: Symmetric shoulder shrug XII: Midline tongue extension Motor: RUE 5/5 proximally and distally LUE 4/5 to 4+/5 proximally and distally BLE 5/5 proximally and distally  Subtle left sided pronator drift.  Sensory: Temp and light touch intact throughout, bilaterally. Unreliable responses to DSS  Deep Tendon Reflexes: 2+ and symmetric throughout Cerebellar: No ataxia with FNF bilaterally  Gait: Deferred   Lab Results: Basic Metabolic Panel: Recent Labs  Lab 05/10/22 1532  NA 133*  K 4.1  CL 98  CO2 28  GLUCOSE 92  BUN 19  CREATININE 0.96  CALCIUM 10.0    CBC: Recent Labs  Lab 05/10/22 1532  WBC 8.0  NEUTROABS 5.7  HGB 10.8*  HCT 34.2*  MCV 91.7  PLT 280    Cardiac Enzymes: No results for input(s): "CKTOTAL", "CKMB", "CKMBINDEX", "TROPONINI" in the last 168 hours.  Lipid Panel: No results for input(s): "CHOL", "TRIG", "HDL", "CHOLHDL", "VLDL", "LDLCALC" in the last 168 hours.  Imaging: CT MAXILLOFACIAL WO CONTRAST  Result Date: 05/10/2022 CLINICAL DATA:  Trauma, fall EXAM: CT MAXILLOFACIAL WITHOUT CONTRAST TECHNIQUE: Multidetector CT imaging of the maxillofacial structures was performed. Multiplanar CT image reconstructions were also generated. RADIATION DOSE REDUCTION: This exam was performed according to the departmental dose-optimization program which includes automated exposure control, adjustment of the mA and/or kV according to patient size and/or use of iterative reconstruction technique. COMPARISON:  None Available. FINDINGS: Osseous: Fracture is seen in the left ala of nasal bones with 1 mm offset in alignment. No other fractures are seen. Degenerative changes  are noted in left TMJ. Orbits: Optic globes appear symmetrical. Retrobulbar soft tissues are unremarkable. There is no demonstrable blowout fracture in the orbits. Sinuses: There are no air-fluid levels or significant mucosal thickening. Soft tissues: There is large subcutaneous hematoma in the left periorbital region extending to the left frontal scalp. Limited intracranial: Unremarkable. IMPRESSION: There is slightly displaced fracture in the left ala of nasal bones. No other fractures are seen in the facial bones. Significant degenerative changes are noted in left TMJ. Optic globes and retrobulbar soft tissues are unremarkable. There are no air-fluid levels or significant mucosal thickening in paranasal sinuses. Electronically Signed   By: Ernie Avena M.D.   On: 05/10/2022 16:49   CT Cervical Spine Wo Contrast  Result Date: 05/10/2022 CLINICAL DATA:  Trauma, fall EXAM: CT CERVICAL SPINE WITHOUT CONTRAST TECHNIQUE: Multidetector CT imaging of the cervical spine was performed without intravenous contrast. Multiplanar CT image reconstructions were also generated. RADIATION DOSE REDUCTION: This exam was performed according to the departmental dose-optimization program which includes automated exposure control, adjustment of the mA and/or kV according to patient size and/or use of iterative reconstruction technique. COMPARISON:  Trauma, fall FINDINGS: Alignment: There is mild anterolisthesis at C2-C3 and C3-C4 levels, possibly suggesting previous ligament injury and facet degeneration. There is mild dextroscoliosis. Skull base and vertebrae: No recent fracture is seen. Degenerative changes are noted. Soft tissues and spinal canal: There is extrinsic pressure over the ventral margin of thecal sac caused by posterior bony spurs and bulging of annulus at multiple levels. Disc levels: There is  encroachment of neural foramina by bony spurs and facet hypertrophy from C2 to C7 levels. Upper chest: Pleural  thickening is seen in the apices. Few blebs are noted. There is peribronchial thickening, possibly suggesting bronchitis. Other: There is inhomogeneous attenuation in thyroid. IMPRESSION: No recent fracture is seen in cervical spine. Cervical spondylosis with encroachment of neural foramina from C2-C7 levels. Electronically Signed   By: Ernie AvenaPalani  Rathinasamy M.D.   On: 05/10/2022 16:42   CT Head Wo Contrast  Result Date: 05/10/2022 CLINICAL DATA:  Trauma, fall EXAM: CT HEAD WITHOUT CONTRAST TECHNIQUE: Contiguous axial images were obtained from the base of the skull through the vertex without intravenous contrast. RADIATION DOSE REDUCTION: This exam was performed according to the departmental dose-optimization program which includes automated exposure control, adjustment of the mA and/or kV according to patient size and/or use of iterative reconstruction technique. COMPARISON:  Previous CT done on 03/25/2022 and MR brain done on 03/26/2022 FINDINGS: Brain: There are no signs of intracranial bleeding. There is a 3 cm area of low attenuation corresponding to the acute infarct seen in the left parietal lobe in the previous MRI. No other new focal abnormalities are noted. Cortical sulci are prominent. Vascular: Unremarkable. Skull: There is subcutaneous hematoma in the left frontal scalp and left periorbital region. Sinuses/Orbits: There are no air-fluid levels in the paranasal sinuses. Other: None. IMPRESSION: No acute intracranial findings are seen. 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 Atrophy. There is left frontal scalp hematoma. No fracture is seen in calvarium. Electronically Signed   By: Ernie AvenaPalani  Rathinasamy M.D.   On: 05/10/2022 16:36   DG Chest 2 View  Result Date: 05/10/2022 CLINICAL DATA:  Fall, altered mental status EXAM: CHEST - 2 VIEW COMPARISON:  Radiograph 111 20,023, CT chest 07/24/2021 FINDINGS: Normal cardiac silhouette. No pneumothorax. No rib fracture identified.  Increased nodular density in the RIGHT mid lung compared to prior radiograph. Interval reduction in RIGHT pleural effusion and LEFT pleural effusion. IMPRESSION: 1. Increased nodular density in the RIGHT mid lung could represent infection, neoplasm, or increased pleural thickening seen on comparison CT. 2. Improvement bilateral pleural effusions. 3. No evidence of thoracic trauma. Electronically Signed   By: Genevive BiStewart  Edmunds M.D.   On: 05/10/2022 16:02     Assessment: 87 y.o. 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 After the fall, she was noted by her daughter to be more confused than her baseline. She had been having episodes of dizziness as well as other spells of zoning out concerning for seizures.  Unclear description of these episodes but there was concern for "zoning out" and feeling of patient "not being present" which raised concern for possible ongoing seizure activity given recent cortical strokes. She has had no actual witnessed seizures but she had been having unwitnessed falls leading to the most recent fall described above. There was no ICH on head CT. Her case was discussed with Neurology and recommendations included inpatient admission for formal Neurology consultation, MRI and EEG followed by LTM for spell characterization.  - Exam reveals mild LEFT upper extremity motor deficits, subtle left facial droop and combined receptive and expressive dysphasia.  - CT head: No acute intracranial findings are seen. There is moderate sized old infarct  in left MCA distribution. Atrophy. There is left frontal scalp hematoma. No fracture is seen in calvarium. - MRI brain:  No acute intracranial  abnormality. Evolving left MCA territory infarct with increased volume loss. No new infarct or hemorrhage.  - Unclear why she has left facial droop and left motor deficits given no acute infarction in the right cerebral hemisphere on MRI.  - Continue to suspect possible seizures given that her recent left MCA cortically based stroke could be serving as a seizure focus.   Recommendations: - LTM EEG after initial spot EEG (pending) - MRI of cervical spine to further evaluate her LUE weakness. (ordered) - Continue her Pradaxa for secondary stroke prevention - Neurology will continue to follow.    Electronically signed: Dr. Caryl Pina 05/11/2022, 12:12 AM

## 2022-05-11 NOTE — ED Notes (Signed)
Pt declines telemetry monitor at this time.

## 2022-05-11 NOTE — ED Notes (Signed)
Pt to MRI

## 2022-05-11 NOTE — Consult Note (Signed)
Cardiology Consultation   Patient ID: Barbara Mcconnell MRN: [id number]; DOB: 23-Oct-1934  Admit date: 05/10/2022 Date of Consult: 05/11/2022  PCP:  Frederica Kuster, MD    [facility] Providers Cardiologist:  Peter Swaziland, MD        Patient Profile:   Barbara Mcconnell is a 87 y.o. female with a hx of paroxysmal atrial fibrillation and recent embolic stroke, heart failure with preserved ejection fraction, history of TAVR who is being seen 05/11/2022 for the evaluation of unsteady gait and injuries at the request of Dr. Katrinka Blazing.  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  She has a large left periorbital hematoma.    She has been unsteady ever since her stroke.  She has had several injuries with head impact.  He has episodes of profound unsteadiness where it "takes everything she has to keep it together" and not fall.  She's also had episodes of confusion, usually brief.  Evaluation in the emergency room today shows significant orthostatic hypotension (reportedly a 40 mmHg drop in systolic blood pressure with paradoxical bradycardia), but also substantial problems with proprioception and balance as assessed in the physical therapy evaluation.  She denies active problems with shortness of breath or chest discomfort.  Denies palpitations or full-blown syncope.  She does not have lower extremity edema or claudication.  She had  moderate nonobstructive CAD by cardiac catheterization performed in December 2022 (50% proximal RCA, 25% LAD and LCx stenoses).   Right heart catheterization performed before TAVR showed mild pulmonary artery hypertension (39/16, mean 24 mmHg, mean capillary wedge pressure 14 mmHg, normal cardiac index 2.7 L/min/m).   She has preserved left ventricular systolic function.  TAVR prosthetic valve function was normal by echocardiogram 03/26/2022 with a mean gradient of 11 mmHg and mild aortic insufficiency, mild mitral stenosis with a mean gradient of 5 mmHg at a heart rate of 70 bpm.  She has bilateral carotid artery stenosis (substantially worse on the right, 90% R ICA stenosis) recently evaluated by Dr. Karin Lieu 05/06/2022 where he recommended consideration for TCAR.  She is on statin therapy, but her most recent LDL cholesterol was 103 on 03/26/2022.  She does have an excellent HDL at 86.   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 Lynnette Caffey & Dr. Laneta Simmers   Seasonal allergies    Takotsubo cardiomyopathy     Past Surgical History:  Procedure Laterality Date   COLONOSCOPY  EYE SURGERY Bilateral    cataracts removed   INTRAOPERATIVE TRANSTHORACIC ECHOCARDIOGRAM N/A 08/03/2021   Procedure: INTRAOPERATIVE TRANSTHORACIC ECHOCARDIOGRAM;  Surgeon: Orbie Pyo, MD;  Location: [facility] INVASIVE CV LAB;  Service: Open Heart Surgery;  Laterality: N/A;   IR CT HEAD LTD  03/25/2022   IR PERCUTANEOUS ART THROMBECTOMY/INFUSION INTRACRANIAL INC DIAG ANGIO   03/25/2022   IR US GUIDE VASC ACCESS RIGHT  03/25/2022   PERICARDIOCENTESIS N/A 07/28/2021   Procedure: PERICARDIOCENTESIS;  Surgeon: Orbie Pyo, MD;  Location: [facility] INVASIVE CV LAB;  Service: Cardiovascular;  Laterality: N/A;   RADIOLOGY WITH ANESTHESIA N/A 03/25/2022   Procedure: IR WITH ANESTHESIA;  Surgeon: Radiologist, Medication, MD;  Location: [facility] OR;  Service: Radiology;  Laterality: N/A;   RIGHT/LEFT HEART CATH AND CORONARY ANGIOGRAPHY N/A 05/13/2019   Procedure: RIGHT/LEFT HEART CATH AND CORONARY ANGIOGRAPHY;  Surgeon: Swaziland, Peter M, MD;  Location: [facility] INVASIVE CV LAB;  Service: Cardiovascular;  Laterality: N/A;   RIGHT/LEFT HEART CATH AND CORONARY ANGIOGRAPHY N/A 07/23/2021   Procedure: RIGHT/LEFT HEART CATH AND CORONARY ANGIOGRAPHY;  Surgeon: Corky Crafts, MD;  Location: [facility] INVASIVE CV LAB;  Service: Cardiovascular;  Laterality: N/A;   TONSILLECTOMY     TRANSCATHETER AORTIC VALVE REPLACEMENT, TRANSFEMORAL N/A 08/03/2021   Procedure: TRANSCATHETER AORTIC VALVE REPLACEMENT, TRANSFEMORAL;  Surgeon: Orbie Pyo, MD;  Location: [facility] INVASIVE CV LAB;  Service: Open Heart Surgery;  Laterality: N/A;   UPPER GI ENDOSCOPY         Inpatient Medications: Scheduled Meds:  dabigatran  150 mg Oral Q12H   escitalopram  5 mg Oral Daily   furosemide  10 mg Oral BID   guaiFENesin  600 mg Oral BID   lisinopril  1.25 mg Oral Daily   loratadine  10 mg Oral Daily   melatonin  5 mg Oral QHS   metoprolol tartrate  25 mg Oral BID   montelukast  10 mg Oral QPC supper   rosuvastatin  20 mg Oral Daily   sodium chloride flush  3 mL Intravenous Q12H   Continuous Infusions:  PRN Meds: acetaminophen **OR** acetaminophen, albuterol, docusate sodium, famotidine, ondansetron **OR** ondansetron (ZOFRAN) IV  Allergies:    Allergies  Allergen Reactions   Codeine Nausea And Vomiting   Cyclobenzaprine Other (See Comments)    Unknown reaction   Iodinated Contrast Media Nausea Only   Prednisone  Other (See Comments)    "Sores in the mouth"    Social History:   Social History   Socioeconomic History   Marital status: Divorced    Spouse name: Not on file   Number of children: Not on file   Years of education: Not on file   Highest education level: Not on file  Occupational History   Not on file  Tobacco Use   Smoking status: Former    Packs/day: 1.00    Years: 20.00    Total pack years: 20.00    Types: Cigarettes   Smokeless tobacco: Never   Tobacco comments:    Quit 30 yrs ago as of 07/2021 per daughter  Vaping Use   Vaping Use: Never used  Substance and Sexual Activity   Alcohol use: Yes    Comment: occasiona wine   Drug use: Never   Sexual activity: Not Currently    Birth control/protection: Post-menopausal  Other Topics Concern   Not on file  Social History Narrative   Tobacco use, amount per day now: None   Past tobacco use, amount per day: Up to  1 pack   How many years did you use tobacco: 20 years.   Alcohol use (drinks per week): None.   Diet: 3 meals daily/mostly cooked at home.   Do you drink/eat things with caffeine: periodic diet coke.   Marital status:  Divorced.                                What year were you married? 1968   Do you live in a house, apartment, assisted living, condo, trailer, etc.? One Level Townhome   Is it one or more stories? One Level   How many persons live in your home? One   Do you have pets in your home?( please list) No   Highest Level of education completed? High School   Current or past profession: Immunologist.    Do you exercise?   Yes when able.                               Type and how often? Silver Sneakers 2-3 times weekly.   Do you have a living will? Yes   Do you have a DNR form?        No                           If not, do you want to discuss one? Yes   Do you have signed POA/HPOA forms?   Yes                     If so, please bring to you appointment      Do you have any difficulty  bathing or dressing yourself? Yes   Do you have any difficulty preparing food or eating? Yes   Do you have any difficulty managing your medications? Yes   Do you have any difficulty managing your finances? No   Do you have any difficulty affording your medications? No   Social Determinants of Health   Financial Resource Strain: Low Risk  (05/14/2019)   Overall Financial Resource Strain (CARDIA)    Difficulty of Paying Living Expenses: Not hard at all  Food Insecurity: Unknown (05/14/2019)   Hunger Vital Sign    Worried About Running Out of Food in the Last Year: Patient refused    Ran Out of Food in the Last Year: Patient refused  Transportation Needs: No Transportation Needs (05/14/2019)   PRAPARE - Administrator, Civil Service (Medical): No    Lack of Transportation (Non-Medical): No  Physical Activity: Not on file  Stress: Not on file  Social Connections: Unknown (05/14/2019)   Social Connection and Isolation Panel [NHANES]    Frequency of Communication with Friends and Family: More than three times a week    Frequency of Social Gatherings with Friends and Family: More than three times a week    Attends Religious Services: Never    Database administrator or Organizations: Not on file    Attends Banker Meetings: Not on file    Marital Status: Not on file  Intimate Partner Violence: Not on file    Family History:    Family History  Problem Relation Age of Onset   Cancer Sister    Lung cancer Sister    Alcohol abuse Sister    Melanoma Brother  Brain cancer Brother    Heart failure Brother    Diabetes Brother    Cancer Brother    Heart Problems Brother    Down syndrome Brother    Heart failure Brother    Endometriosis Daughter    Hypertension Other      ROS:  Please see the history of present illness.   All other ROS reviewed and negative.     Physical Exam/Data:   Vitals:   05/11/22 1155 05/11/22 1200 05/11/22 1315 05/11/22 1400   BP: (!) 171/57 (!) 156/71 (!) 158/99 (!) 122/50  Pulse: 72 71 73 72  Resp:      Temp:      TempSrc:      SpO2: 95% 99% 95% 93%   No intake or output data in the 24 hours ending 05/11/22 1629    05/06/2022    3:36 PM 04/28/2022   10:03 AM 04/12/2022   10:27 AM  Last 3 Weights  Weight (lbs) 117 lb 8 oz 118 lb 119 lb 6.4 oz  Weight (kg) 53.298 kg 53.524 kg 54.159 kg     There is no height or weight on file to calculate BMI.  General:  Well nourished, well developed, in no acute distress HEENT: Large left periorbital hematoma Neck: no JVD Vascular: Bilateral carotid bruits; Distal pulses 2+ bilaterally Cardiac:  normal S1, S2; RRR; no diastolic murmur, 2/6 aortic ejection murmur Lungs:  clear to auscultation bilaterally, no wheezing, rhonchi or rales  Abd: soft, nontender, no hepatomegaly  Ext: no edema Musculoskeletal:  No deformities, BUE and BLE strength normal and equal Skin: warm and dry  Neuro: Relatively small degree of expressive aphasia.  Neurological exam performed with the patient on a stretcher in the hallway shows minimal right-sided hemiparesis. Psych:  Normal affect   EKG:  The EKG was personally reviewed and demonstrates: Sinus rhythm, left atrial abnormality, voltage criteria for LVH no ischemic repolarization abnormalities Telemetry:  Telemetry was personally reviewed and demonstrates: Normal sinus rhythm  Relevant CV Studies:  Cardiac catheterization December 2022  RIGHT/LEFT HEART CATH AND CORONARY ANGIOGRAPHY    Conclusion       Prox RCA lesion is 50% stenosed.   Prox Cx to Mid Cx lesion is 25% stenosed.   Prox LAD to Mid LAD lesion is 25% stenosed.   Hemodynamic findings consistent with pulmonary hypertension.   Stable, mild to moderate coronary artery disease.  No indication for PCI.   Unable to cross aortic valve, consistent with worsening noted on echocardiogram.  Continue plans for TAVR w/u.    Echocardiogram 03/26/2022    1. Left  ventricular ejection fraction, by estimation, is 65 to 70%. The  left ventricle has normal function. The left ventricle has no regional  wall motion abnormalities. There is moderate left ventricular hypertrophy.  Left ventricular diastolic  parameters are indeterminate.   2. Right ventricular systolic function is normal. The right ventricular  size is normal. Tricuspid regurgitation signal is inadequate for assessing  PA pressure.   3. Left atrial size was moderately dilated.   4. The mitral valve is degenerative. Trivial mitral valve regurgitation.  Mild mitral stenosis. The mean mitral valve gradient is 5.0 mmHg with  average heart rate of 68 bpm. Severe mitral annular calcification.   5. The abnormal color flow signal seen previously below the prosthetic  aortic valve is seen again on this exam, color flow in multiple views  suggestive of valvular regurgitation, though it cannot be demonstrated  with spectral  Doppler, which would  support color flow artifact, possibly from stent frame of valve. No  evidence of prosthetic valve obstruction or significant regurgitation.      The aortic valve has been repaired/replaced. Aortic valve  regurgitation is mild. There is a 23 mm Sapien prosthetic (TAVR) valve  present in the aortic position. Procedure Date: 08/03/21. Echo findings are  consistent with likely stable structure and  function of the aortic valve prosthesis. Aortic valve area, by VTI  measures 2.58 cm. Aortic valve mean gradient measures 11.0 mmHg. Aortic  valve Vmax measures 1.98 m/s. Aortic valve acceleration time measures 85  msec.   6. The inferior vena cava is normal in size with greater than 50%  respiratory variability, suggesting right atrial pressure of 3 mmHg.   Carotid duplex 03/26/2022  Right Carotid: Velocities in the right ICA are consistent with a 40-59%                 stenosis.   Left Carotid: Velocities in the left ICA are consistent with a 1-39%  stenosis.    Vertebrals:  Bilateral vertebral arteries demonstrate antegrade flow.  Subclavians: Normal flow hemodynamics were seen in bilateral subclavian               arteries.   *See table(s) above for measurements and observations.    Event monitor January 2023  Patch Wear Time:  13 days and 20 hours (2023-01-11T10:54:50-0500 to 2023-01-25T06:56:22-0500)   Patient had a min HR of 33 bpm, max HR of 197 bpm, and avg HR of 84 bpm. Predominant underlying rhythm was Sinus Rhythm.    EVENTS: 18 Supraventricular Tachycardia runs occurred, the run with the fastest interval lasting 5 beats with a max rate of 197 bpm, the longest lasting 9.2 secs with an avg rate of 140 bpm.    Atrial Fibrillation/Flutter occurred (1% burden), ranging from 90-177 bpm (avg of 136 bpm), the longest lasting 3 hours 41 mins with an avg rate of 136 bpm.    5 Pauses occurred, the longest lasting 6.1 secs (during sleep).    Atrial Fibrillation/Flutter was detected within +/- 45 seconds of symptomatic patient event(s). Isolated SVEs were rare (<1.0%), SVE Couplets were rare (<1.0%), and SVE Triplets were rare (<1.0%). Isolated VEs were rare (<1.0%), VE Couplets were rare (<1.0%), and no VE Triplets were present.   No ventricular tachyarrhythmias or clinically significant bradyarrhythmias were detected.   Patient is already on anticoagulation for paroxysmal atrial fibrillation.  Head and neck CT angiography September 2023  CTA neck:   1. The common carotid and internal carotid arteries are patent within the neck. Atherosclerotic plaque, bilaterally. Most notably, there is a severe (estimated 80-90% stenosis) at the origin of the right ICA. 2. Vertebral arteries patent within the neck. Mild atherosclerotic narrowing at the origin of the dominant left vertebral artery. 3.  Aortic Atherosclerosis (ICD10-I70.0). 4. Bronchiectasis and scattered tree-in-bud pulmonary opacities within the bilateral upper lobes. The  tree-in-bud opacities likely reflect sequelae of chronic infection. 5. Cervical spondylosis.   CTA head:   1. Occluded proximal-to-mid M2 left MCA vessel. 2. Additional intracranial atherosclerotic disease, as described. Most notably, there is a severe stenosis within the distal V4 right vertebral artery (the non-dominant vertebral artery).   CT perfusion head:   The perfusion software identifies a 12 mL region of critically hypoperfused parenchyma within the left MCA vascular territory. No core infarct is identified. Reported mismatch volume: 12 mL.   Laboratory Data:  High Sensitivity Troponin:  No results for input(s): "TROPONINIHS" in the last 720 hours.   Chemistry Recent Labs  Lab 05/10/22 1532 05/11/22 0958  NA 133* 134*  K 4.1 3.5  CL 98 100  CO2 28 27  GLUCOSE 92 158*  BUN 19 12  CREATININE 0.96 0.99  CALCIUM 10.0 9.1  GFRNONAA 58* 56*  ANIONGAP 7 7    Recent Labs  Lab 05/10/22 1532 05/11/22 0958  PROT 7.7 6.1*  ALBUMIN 4.3 3.3*  AST 53* 50*  ALT 30 26  ALKPHOS 53 47  BILITOT 1.5* 1.7*   Lipids No results for input(s): "CHOL", "TRIG", "HDL", "LABVLDL", "LDLCALC", "CHOLHDL" in the last 168 hours.  Hematology Recent Labs  Lab 05/10/22 1532  WBC 8.0  RBC 3.73*  HGB 10.8*  HCT 34.2*  MCV 91.7  MCH 29.0  MCHC 31.6  RDW 14.5  PLT 280   Thyroid  Recent Labs  Lab 05/10/22 1532  TSH 0.623    BNPNo results for input(s): "BNP", "PROBNP" in the last 168 hours.  DDimer No results for input(s): "DDIMER" in the last 168 hours.   Radiology/Studies:  MR CERVICAL SPINE WO CONTRAST  Result Date: 05/11/2022 CLINICAL DATA:  Cervical radiculopathy, no red flags EXAM: MRI CERVICAL SPINE WITHOUT CONTRAST TECHNIQUE: Multiplanar, multisequence MR imaging of the cervical spine was performed. No intravenous contrast was administered. COMPARISON:  CT cervical spine May 10, 2022. FINDINGS: Alignment: Mild anterolisthesis of C2 on C3 and C3 on C4. Vertebrae:  Degenerative/discogenic endplate signal changes in the lower cervical spine. No specific evidence of acute fracture or discitis/osteomyelitis. No suspicious bone lesions. Cord: Normal cord signal. Posterior Fossa, vertebral arteries, paraspinal tissues: Visualized vertebral artery flow voids are maintained. No evidence of acute abnormality in the visualized posterior fossa. Disc levels: C2-C3: Small posterior disc osteophyte complex with right greater than left facet and uncovertebral hypertrophy. Resulting mild to moderate right and mild left foraminal stenosis. Mild canal stenosis. C3-C4: Posterior disc osteophyte complex with bilateral facet and uncovertebral hypertrophy. Resulting severe bilateral foraminal stenosis. Mild to moderate canal stenosis. C4-C5: Posterior disc osteophyte complex with bilateral facet uncovertebral hypertrophy. Resulting moderate bilateral foraminal stenosis. Mild to moderate canal stenosis. C5-C6: Posterior disc osteophyte complex with right greater than left facet and uncovertebral hypertrophy. Resulting severe right foraminal stenosis. Mild canal stenosis. Patent left foramen. C6-C7: Posterior disc osteophyte complex with right greater than left facet and uncovertebral hypertrophy. Resulting moderate right foraminal stenosis. Patent canal and left foramen. C7-T1: No significant disc protrusion, foraminal stenosis, or canal stenosis. IMPRESSION: 1. Severe foraminal stenosis bilaterally at C3-C4 and on the right at C5-C6. Moderate foraminal stenosis on the right at C6-C7. Milder multilevel foraminal stenosis is detailed above. 2. Mild to moderate canal stenosis at C3-C4 and C4-C5. Mild canal stenosis at C2-C3 and C5-C6. Electronically Signed   By: Feliberto Harts M.D.   On: 05/11/2022 15:11   CT CHEST WO CONTRAST  Result Date: 05/11/2022 CLINICAL DATA:  Respiratory illness.  Cough.  Recent fall EXAM: CT CHEST WITHOUT CONTRAST TECHNIQUE: Multidetector CT imaging of the chest was  performed following the standard protocol without IV contrast. RADIATION DOSE REDUCTION: This exam was performed according to the departmental dose-optimization program which includes automated exposure control, adjustment of the mA and/or kV according to patient size and/or use of iterative reconstruction technique. COMPARISON:  X-ray 05/10/2022, CT 07/24/2021 FINDINGS: Cardiovascular: Heart size is upper limits of normal. No pericardial effusion. Postsurgical changes of TAVR. Thoracic aorta is nonaneurysmal. Atherosclerotic vascular calcifications of the aorta and  coronary arteries. Central pulmonary vasculature is upper limits of normal in size. Mediastinum/Nodes: No enlarged mediastinal or axillary lymph nodes. Thyroid gland, trachea, and esophagus demonstrate no significant findings. Lungs/Pleura: Chronic bronchiectasis within the right middle lobe and lingula with peribronchovascular nodularity, not significantly changed from prior. New site of tree-in-bud nodularity within the superior segment of the left lower lobe (series 8, image 56). Scattered areas of tree-in-bud nodularity within the bilateral lung apices is unchanged. Left basilar atelectasis. No pleural effusion or pneumothorax. Upper Abdomen: No acute abnormality. Musculoskeletal: Mildly exaggerated thoracic kyphosis. No acute bony or chest wall abnormality. IMPRESSION: 1. New site of tree-in-bud nodularity within the superior segment of the left lower lobe, compatible with an infectious or inflammatory bronchiolitis. 2. Chronic bronchiectasis within the right middle lobe and lingula with peribronchovascular nodularity suggesting sequela of chronic atypical infection such as mycobacteria. 3. Aortic and coronary artery atherosclerosis (ICD10-I70.0). Electronically Signed   By: Duanne Guess D.O.   On: 05/11/2022 11:23   MR BRAIN WO CONTRAST  Result Date: 05/11/2022 CLINICAL DATA:  Stroke follow-up EXAM: MRI HEAD WITHOUT CONTRAST TECHNIQUE:  Multiplanar, multiecho pulse sequences of the brain and surrounding structures were obtained without intravenous contrast. COMPARISON:  MRI Brain 03/26/22, CT head 05/10/22 FINDINGS: Brain: Redemonstrated subacute infarct in the posterior left frontal lobe with persistent hyperintense signal diffusion-weighted imaging and decreased signal on the ADC map. Compared to prior exam there is increased volume loss in this region. Additional scattered punctate areas of infarction seen on prior MRI are not visualized on this exam. There is a small punctate focus of microhemorrhage in the anterior left temporal lobe. There is no evidence of associated with the area of infarction. There is sequela mild overall chronic microvascular ischemic change. Chronic bilateral cerebellar infarcts. Unchanged size and shape of the ventricular system. Vascular: Normal flow voids. Skull and upper cervical spine: No suspicious osseous lesions are visualized. There is stepwise anterolisthesis of C2 on C3, C3 on C4, and C4 on C5. Sinuses/Orbits: Bilateral lens replacement. Other: There is a small left frontal scalp hematoma measuring up to 1.6 cm. IMPRESSION: 1. No acute intracranial abnormality 2. Evolving left MCA territory infarct with increased volume loss. No new infarct or hemorrhage. 3. Small left frontal scalp hematoma measuring up to 1.6 cm. Electronically Signed   By: Lorenza Cambridge M.D.   On: 05/11/2022 09:30   CT MAXILLOFACIAL WO CONTRAST  Result Date: 05/10/2022 CLINICAL DATA:  Trauma, fall EXAM: CT MAXILLOFACIAL WITHOUT CONTRAST TECHNIQUE: Multidetector CT imaging of the maxillofacial structures was performed. Multiplanar CT image reconstructions were also generated. RADIATION DOSE REDUCTION: This exam was performed according to the departmental dose-optimization program which includes automated exposure control, adjustment of the mA and/or kV according to patient size and/or use of iterative reconstruction technique.  COMPARISON:  None Available. FINDINGS: Osseous: Fracture is seen in the left ala of nasal bones with 1 mm offset in alignment. No other fractures are seen. Degenerative changes are noted in left TMJ. Orbits: Optic globes appear symmetrical. Retrobulbar soft tissues are unremarkable. There is no demonstrable blowout fracture in the orbits. Sinuses: There are no air-fluid levels or significant mucosal thickening. Soft tissues: There is large subcutaneous hematoma in the left periorbital region extending to the left frontal scalp. Limited intracranial: Unremarkable. IMPRESSION: There is slightly displaced fracture in the left ala of nasal bones. No other fractures are seen in the facial bones. Significant degenerative changes are noted in left TMJ. Optic globes and retrobulbar soft tissues are unremarkable. There  are no air-fluid levels or significant mucosal thickening in paranasal sinuses. Electronically Signed   By: Ernie AvenaPalani  Rathinasamy M.D.   On: 05/10/2022 16:49   CT Cervical Spine Wo Contrast  Result Date: 05/10/2022 CLINICAL DATA:  Trauma, fall EXAM: CT CERVICAL SPINE WITHOUT CONTRAST TECHNIQUE: Multidetector CT imaging of the cervical spine was performed without intravenous contrast. Multiplanar CT image reconstructions were also generated. RADIATION DOSE REDUCTION: This exam was performed according to the departmental dose-optimization program which includes automated exposure control, adjustment of the mA and/or kV according to patient size and/or use of iterative reconstruction technique. COMPARISON:  Trauma, fall FINDINGS: Alignment: There is mild anterolisthesis at C2-C3 and C3-C4 levels, possibly suggesting previous ligament injury and facet degeneration. There is mild dextroscoliosis. Skull base and vertebrae: No recent fracture is seen. Degenerative changes are noted. Soft tissues and spinal canal: There is extrinsic pressure over the ventral margin of thecal sac caused by posterior bony spurs and  bulging of annulus at multiple levels. Disc levels: There is encroachment of neural foramina by bony spurs and facet hypertrophy from C2 to C7 levels. Upper chest: Pleural thickening is seen in the apices. Few blebs are noted. There is peribronchial thickening, possibly suggesting bronchitis. Other: There is inhomogeneous attenuation in thyroid. IMPRESSION: No recent fracture is seen in cervical spine. Cervical spondylosis with encroachment of neural foramina from C2-C7 levels. Electronically Signed   By: Ernie AvenaPalani  Rathinasamy M.D.   On: 05/10/2022 16:42   CT Head Wo Contrast  Result Date: 05/10/2022 CLINICAL DATA:  Trauma, fall EXAM: CT HEAD WITHOUT CONTRAST TECHNIQUE: Contiguous axial images were obtained from the base of the skull through the vertex without intravenous contrast. RADIATION DOSE REDUCTION: This exam was performed according to the departmental dose-optimization program which includes automated exposure control, adjustment of the mA and/or kV according to patient size and/or use of iterative reconstruction technique. COMPARISON:  Previous CT done on 03/25/2022 and MR brain done on 03/26/2022 FINDINGS: Brain: There are no signs of intracranial bleeding. There is a 3 cm area of low attenuation corresponding to the acute infarct seen in the left parietal lobe in the previous MRI. No other new focal abnormalities are noted. Cortical sulci are prominent. Vascular: Unremarkable. Skull: There is subcutaneous hematoma in the left frontal scalp and left periorbital region. Sinuses/Orbits: There are no air-fluid levels in the paranasal sinuses. Other: None. IMPRESSION: No acute intracranial findings are seen. 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 Atrophy. There is left frontal scalp hematoma. No fracture is seen in calvarium. Electronically Signed   By: Ernie AvenaPalani  Rathinasamy M.D.   On: 05/10/2022 16:36   DG Chest 2 View  Result Date: 05/10/2022 CLINICAL DATA:  Fall, altered mental  status EXAM: CHEST - 2 VIEW COMPARISON:  Radiograph 111 20,023, CT chest 07/24/2021 FINDINGS: Normal cardiac silhouette. No pneumothorax. No rib fracture identified. Increased nodular density in the RIGHT mid lung compared to prior radiograph. Interval reduction in RIGHT pleural effusion and LEFT pleural effusion. IMPRESSION: 1. Increased nodular density in the RIGHT mid lung could represent infection, neoplasm, or increased pleural thickening seen on comparison CT. 2. Improvement bilateral pleural effusions. 3. No evidence of thoracic trauma. Electronically Signed   By: Genevive BiStewart  Edmunds M.D.   On: 05/10/2022 16:02     Assessment and Plan:   Recurrent falls/injury: These have not consistently been associated with dizziness.  She has not had any loss of consciousness.  She does have intermittent episodes of disorientation and confusion that appear  to consistently happen when she is upright.  Think this is a multifactorial problem.  She does have some orthostatic hypotension.  Will need to tolerate higher systolic blood pressure.  She does have carotid stenosis although severity on most recent ultrasound is not as striking as it was on the CT angiogram from September.  It is possible that she also has some stenoses in the smaller branches in the head, further increasing impacts of episodes of hypotension.  Finally, she clearly has some problems with normal gait and balance as a consequence of her stroke.  May need specialized physical therapy, geared to improved balance. ASCVD: Severe stenosis described by CT angiography in the right internal carotid artery (contralateral to her recent embolic stroke) as well as stenosis of the dominant left vertebral artery.  The presence of these lesions may increase the impact of episodes of orthostatic hypotension. Recent embolic stroke: This occurred while she was receiving reduced dose Eliquis (appropriate for age and small body size) and she has been switched to Pradaxa.   Despite her falls and injuries, I believe the risk of recurrent embolic stroke still outweighs the risk of injuries and bleeding. Paroxysmal atrial fibrillation: Currently maintaining sinus rhythm.  The burden of arrhythmia appears to be low (it was 1% on a monitor in January).  To date, I do not think we have demonstrated a connection between her tachyarrhythmia and the episodes of confusion or or injuries. Sinus pauses: Notably, these were recorded incidentally on her post TAVR monitor and occurred only during sleep.  These were not due to AV block, but rather what appears to be sinus node arrest.  We will discontinue her beta-blocker.  Unfortunately, this could lead to issues with RVR during atrial fibrillation.  No overt proof for tachycardia-bradycardia syndrome (the pauses are not postconversion pauses), but ultimately the appropriate treatment may be implantation of a dual-chamber permanent pacemaker.  To date we have not had clear proof of an association between her spells of disorientation or falls and arrhythmia.  She has not had frank syncope. S/P TAVR: Normal function of the bioprosthesis. HTN: Her blood pressure is repeatedly described as being very volatile.  Recommend tolerating systolic blood pressure up to 160-170 mmHg while we clarify the mechanism of her falls. HFpEF: Appears euvolemic, clinically compensated HLP: Widespread evidence of atherosclerosis involving the carotid arteries, branches of the abdominal aorta, nonobstructive CAD.  Recommend target LDL cholesterol less than 100.  Currently on rosuvastatin 20 mg daily.   Risk Assessment/Risk Scores:        ASSESSMENT AND PLAN: Paroxysmal Atrial Fibrillation (ICD10:  I48.0) The patient's CHA2DS2-VASc score is 8  , indicating   Stroke risk was 10.8% per year  and 15.2% risk of stroke/TIA/systemic embolism       Signed,  Sanda Klein, MD    05/11/2022 4:48 PM         For questions or updates, please contact [place] Please consult [email or web address] for contact info under    Signed, Sanda Klein, MD  05/11/2022 4:29 PM

## 2022-05-11 NOTE — Progress Notes (Signed)
Neurology Progress Note  Brief HPI: Patient with history of PAF on Pradaxa, recent left MCA stroke, CAD, HTN, HLD, ICA stenosis, Takotsubo cardiomyopathy and TAVR presents with recurrent episodes of dizziness and imbalance which often occur when she is standing up.  She has also had spells of "zoning out" which have raised concern for seizures.  MRI brain was negative for acute stroke.  Subjective: Patient reports that she is doing well except for her left orbital hematoma.    Exam: Vitals:   05/11/22 1155 05/11/22 1200  BP: (!) 171/57 (!) 156/71  Pulse: 72 71  Resp:    Temp:    SpO2: 95% 99%   Gen: In bed, NAD Resp: non-labored breathing, no acute distress Abd: soft, nt  Neuro: Mental Status: Alert and oriented x2, speech with mild aphasia, naming and repetition somewhat impaired Cranial Nerves:PERRL, EOMI, facial sensation symmetrical, face symmetrical,hearing intact to voice, phonation normal, shoulder shrug symmetrical and tongue midline Motor: Normal and symmetrical antigravity strength in bilateral arms and legs Sensory:intact to light touch throughout and symmetrical Gait: Deferred  Pertinent Labs:    Latest Ref Rng & Units 05/10/2022    3:32 PM 03/30/2022    5:01 AM 03/29/2022    4:21 AM  CBC  WBC 4.0 - 10.5 K/uL 8.0  6.9  8.7   Hemoglobin 12.0 - 15.0 g/dL 10.8  10.1  9.9   Hematocrit 36.0 - 46.0 % 34.2  31.1  30.0   Platelets 150 - 400 K/uL 280  265  249        Latest Ref Rng & Units 05/11/2022    9:58 AM 05/10/2022    3:32 PM 03/30/2022    5:01 AM  BMP  Glucose 70 - 99 mg/dL 158  92  101   BUN 8 - 23 mg/dL 12  19  16    Creatinine 0.44 - 1.00 mg/dL 0.99  0.96  0.93   Sodium 135 - 145 mmol/L 134  133  135   Potassium 3.5 - 5.1 mmol/L 3.5  4.1  3.7   Chloride 98 - 111 mmol/L 100  98  100   CO2 22 - 32 mmol/L 27  28  27    Calcium 8.9 - 10.3 mg/dL 9.1  10.0  9.2      Imaging Reviewed:  CT head:  Old left MCA stroke, left scalp hematoma  MRI brain: No acute  abnormality, evolving left MCA infarct, left frontal scalp hematoma  Assessment: 87 year old patient with recent stroke presents with recurrent episodes of imbalance, dizziness and "zoning out", one of which led to a fall.  MRI was negative for acute stroke.  Episodes may be presyncopal in nature, possibly related to orthostatic hypotension.  Patient's daughter states that her blood pressure has been widely variable over the last month, making this more likely.  They may also be caused by seizure activity in setting of recent stroke.  Impression: Presyncopal episodes vs. Seizure activity in patient with recent stroke  Recommendations: 1)Obtain full orthostatic vital signs (lying, sitting and standing) 2) LTM EEG for spell capture  [place] , MSN, AGACNP-BC [facility] See Amion for schedule and pager information 05/11/2022 12:09 PM   I have seen the patient and reviewed the above note.  There are some components of her episodes that sound very much suspicious for orthostasis, but given the reports of intermittent confusion, I agree with Dr. Cheral Marker that would be helpful to capture a prolonged EEG.  Roland Rack, MD [facility] [phone]  If 7pm- 7am, please page neurology on call as listed in [place].

## 2022-05-11 NOTE — Evaluation (Signed)
Physical Therapy Evaluation Patient Details Name: Barbara Mcconnell MRN: [id number] DOB: [birth year] Today's Date: 05/11/2022  History of Present Illness  87 yo with recent history of stroke was admitted 10/17 for fall with L eye bruising, no acute changes on MRI.  Pt is suspected of seizures, has carotid stenosis and recent L MCA infarct.  Residual aphasia issues.  PMHx:  L MCA infarct, aphasia, TAVR, atherosclerosis, PAF, anxiety, aortic stenosis, OA, CAD, heart murmur, HTN  Clinical Impression  Pt was seen for gait and balance testing, with high level balance skills from the highest level of Berg test.  Pt is unable to stand on one foot, cannot stand tandem without LOB, and can only look to one side on R.  Requires contact for step task, and cannot turn without close guard.  Her effort is good but is over confident and unsafe.  Will recommend her to go to SNF but family has concerns that insurance will be unreliable and not cover pt's needs.  Follow for 3x per week for now due to concern that pt may go directly home over financial issues.  Focus on gait quality and distances to get her as independent and safe as possible.      Recommendations for follow up therapy are one component of a multi-disciplinary discharge planning process, led by the attending physician.  Recommendations may be updated based on patient status, additional functional criteria and insurance authorization.  Follow Up Recommendations Skilled nursing-short term rehab (<3 hours/day) Can patient physically be transported by private vehicle: No    Assistance Recommended at Discharge Frequent or constant Supervision/Assistance  Patient can return home with the following  A little help with walking and/or transfers;A little help with bathing/dressing/bathroom;Assistance with cooking/housework;Direct supervision/assist for financial management;Direct supervision/assist for medications management;Assist for transportation    Equipment  Recommendations None recommended by PT  Recommendations for Other Services       Functional Status Assessment Patient has had a recent decline in their functional status and demonstrates the ability to make significant improvements in function in a reasonable and predictable amount of time.     Precautions / Restrictions Precautions Precautions: Fall Precaution Comments: R hemiparesis Restrictions Weight Bearing Restrictions: No      Mobility  Bed Mobility Overal bed mobility: Needs Assistance Bed Mobility: Supine to Sit, Sit to Supine     Supine to sit: Min assist Sit to supine: Min assist   General bed mobility comments: min assist for trunk off bed and min to support legs back to bed    Transfers Overall transfer level: Needs assistance Equipment used: Rolling walker (2 wheels), 1 person hand held assist Transfers: Sit to/from Stand Sit to Stand: Min guard           General transfer comment: min guard for safety and lines    Ambulation/Gait Ambulation/Gait assistance: Min guard Gait Distance (Feet): 50 Feet Assistive device: Rolling walker (2 wheels), 1 person hand held assist   Gait velocity: reduced Gait velocity interpretation: <1.8 ft/sec, indicate of risk for recurrent falls Pre-gait activities: standing balance ck and sats ck General Gait Details: pt is using walker fairly well initially but chooses to go around objects closely, then drops walker as she gets near her target (toilet, bed).  Discussed with daughter her safety concerns due to tending to reach for furniture and listing a bit laterally with these choices  Stairs            Wheelchair Mobility    Modified Rankin (  Stroke Patients Only) Modified Rankin (Stroke Patients Only) Pre-Morbid Rankin Score: Moderate disability Modified Rankin: Moderately severe disability     Balance Overall balance assessment: Needs assistance, History of Falls Sitting-balance support: Feet supported,  Single extremity supported Sitting balance-Leahy Scale: Fair     Standing balance support: Bilateral upper extremity supported, During functional activity Standing balance-Leahy Scale: Poor Standing balance comment: posterior bias as well as R side lean                             Pertinent Vitals/Pain Pain Assessment Pain Assessment: Faces Faces Pain Scale: Hurts little more Pain Location: L eye where pt landed in fall Pain Descriptors / Indicators: Guarding, Grimacing Pain Intervention(s): Monitored during session    Home Living Family/patient expects to be discharged to:: Private residence Living Arrangements: Alone Available Help at Discharge: Family;Available PRN/intermittently Type of Home: House Home Access: Level entry       Home Layout: One level Home Equipment: Cane - single point;Tub bench Additional Comments: daughter is overwhelmed, has expenses attached to paying for having set up ALF that pt has yet to use, pt had recent SNF stay but ins stopped paying, plus has her own home still.  Daughter has insufficient income for the expenses she has incurred with dealing with pt    Prior Function Prior Level of Function : Independent/Modified Independent             Mobility Comments: PLOF SPC without help but since stroke is on RW with instability       Hand Dominance   Dominant Hand: Right    Extremity/Trunk Assessment   Upper Extremity Assessment Upper Extremity Assessment: Defer to OT evaluation    Lower Extremity Assessment Lower Extremity Assessment: RLE deficits/detail RLE Deficits / Details: mild strength changes RLE Coordination: decreased gross motor    Cervical / Trunk Assessment Cervical / Trunk Assessment: Kyphotic (mild)  Communication   Communication: Expressive difficulties;HOH  Cognition Arousal/Alertness: Awake/alert Behavior During Therapy: Impulsive Overall Cognitive Status: Difficult to assess                                           General Comments General comments (skin integrity, edema, etc.): Pt has very specific high level balance deficits and should be seen for therapy to supervise all gait and balance challenges.  Pt is making unsafe choices for mobility and is unclear how close to baseline she is.  Daughter is not offering opinions about her current function as much as expressing concern about finances to keep pt covered safely    Exercises     Assessment/Plan    PT Assessment Patient needs continued PT services  PT Problem List Decreased strength;Decreased activity tolerance;Decreased balance;Decreased mobility;Decreased coordination;Decreased knowledge of use of DME;Decreased safety awareness       PT Treatment Interventions DME instruction;Gait training;Functional mobility training;Therapeutic activities;Therapeutic exercise;Balance training;Neuromuscular re-education;Patient/family education    PT Goals (Current goals can be found in the Care Plan section)  Acute Rehab PT Goals Patient Stated Goal: to go directly to her home PT Goal Formulation: With patient/family Time For Goal Achievement: 05/25/22 Potential to Achieve Goals: Good    Frequency Min 3X/week     Co-evaluation               AM-PAC PT "6 Clicks" Mobility  Outcome Measure Help  needed turning from your back to your side while in a flat bed without using bedrails?: A Little Help needed moving from lying on your back to sitting on the side of a flat bed without using bedrails?: A Little Help needed moving to and from a bed to a chair (including a wheelchair)?: A Little Help needed standing up from a chair using your arms (e.g., wheelchair or bedside chair)?: A Little Help needed to walk in hospital room?: A Little Help needed climbing 3-5 steps with a railing? : Total 6 Click Score: 16    End of Session   Activity Tolerance: Patient limited by fatigue;Treatment limited secondary to medical  complications (Comment) Patient left: in bed;with call bell/phone within reach;with family/visitor present Nurse Communication: Mobility status PT Visit Diagnosis: Unsteadiness on feet (R26.81);Muscle weakness (generalized) (M62.81);Other abnormalities of gait and mobility (R26.89);History of falling (Z91.81);Difficulty in walking, not elsewhere classified (R26.2);Hemiplegia and hemiparesis Hemiplegia - Right/Left: Right Hemiplegia - dominant/non-dominant: Dominant Hemiplegia - caused by: Cerebral infarction    Time: 1130-1203 PT Time Calculation (min) (ACUTE ONLY): 33 min   Charges:   PT Evaluation $PT Eval Moderate Complexity: 1 Mod PT Treatments $Gait Training: 8-22 mins       Ivar Drape 05/11/2022, 4:13 PM  Samul Dada, PT PhD Acute Rehab Dept. Number: [facility] [phone] and [facility] [phone]

## 2022-05-11 NOTE — ED Notes (Signed)
Dr. Smith at bedside.

## 2022-05-11 NOTE — ED Notes (Signed)
Pt wheeled to bathroom in wheelchair and assisted in using bathroom. Pt wheeled back to bed and assisted back to bed. Pt connected to portable monitor and admitted to tele via portable monitor. Portable privacy blinders spread around pt's bed for privacy. Pt able to still be visualized from nurses station. Pt in bed resting comfortably.

## 2022-05-12 ENCOUNTER — Ambulatory Visit: Payer: Medicare HMO | Admitting: Orthopedic Surgery

## 2022-05-12 ENCOUNTER — Inpatient Hospital Stay (HOSPITAL_COMMUNITY): Payer: Medicare HMO

## 2022-05-12 DIAGNOSIS — I455 Other specified heart block: Secondary | ICD-10-CM

## 2022-05-12 DIAGNOSIS — R6889 Other general symptoms and signs: Secondary | ICD-10-CM

## 2022-05-12 DIAGNOSIS — R4182 Altered mental status, unspecified: Secondary | ICD-10-CM | POA: Diagnosis not present

## 2022-05-12 DIAGNOSIS — I5032 Chronic diastolic (congestive) heart failure: Secondary | ICD-10-CM | POA: Diagnosis not present

## 2022-05-12 DIAGNOSIS — I6523 Occlusion and stenosis of bilateral carotid arteries: Secondary | ICD-10-CM | POA: Diagnosis not present

## 2022-05-12 DIAGNOSIS — W19XXXA Unspecified fall, initial encounter: Secondary | ICD-10-CM | POA: Diagnosis not present

## 2022-05-12 LAB — CBC
HCT: 27 % — ABNORMAL LOW (ref 36.0–46.0)
Hemoglobin: 8.8 g/dL — ABNORMAL LOW (ref 12.0–15.0)
MCH: 29.5 pg (ref 26.0–34.0)
MCHC: 32.6 g/dL (ref 30.0–36.0)
MCV: 90.6 fL (ref 80.0–100.0)
Platelets: 216 10*3/uL (ref 150–400)
RBC: 2.98 MIL/uL — ABNORMAL LOW (ref 3.87–5.11)
RDW: 14.1 % (ref 11.5–15.5)
WBC: 7.4 10*3/uL (ref 4.0–10.5)
nRBC: 0 % (ref 0.0–0.2)

## 2022-05-12 LAB — HCV INTERPRETATION

## 2022-05-12 LAB — HEPATITIS PANEL, ACUTE: Hep A IgM: POSITIVE — AB

## 2022-05-12 MED ORDER — INFLUENZA VAC A&B SA ADJ QUAD 0.5 ML IM PRSY
0.5000 mL | PREFILLED_SYRINGE | INTRAMUSCULAR | Status: AC
  Administered 2022-05-13: 0.5 mL via INTRAMUSCULAR
  Filled 2022-05-12: qty 0.5

## 2022-05-12 MED ORDER — TUBERCULIN PPD 5 UNIT/0.1ML ID SOLN
5.0000 [IU] | Freq: Once | INTRADERMAL | Status: AC
  Administered 2022-05-12: 5 [IU] via INTRADERMAL
  Filled 2022-05-12: qty 0.1

## 2022-05-12 NOTE — TOC Initial Note (Signed)
Transition of Care [facility]) - Initial/Assessment Note    Patient Details  Name: Barbara Mcconnell MRN: [id number] Date of Birth: 1934/08/01  Transition of Care [facility]) CM/SW Contact:    Geralynn Ochs, LCSW Phone Number: 05/12/2022, 2:15 PM  Clinical Narrative:           CSW spoke with daughter, Earnest Bailey, to discuss current PT recommendation for SNF. CSW discussed with Earnest Bailey concerns about SNF placement (insurance cut the patient off before she was ready at previous SNF placement at [facility], daughter had to private pay after insurance cut off too soon), and preference to get patient back to [facility] with home health if able. CSW updated OT assigned to see patient today to review for appropriateness to return to ALF with home health. Daughter also asking if speech could see patient in house, so the patient does not lose any improvements she's been making with speech as her aphasia is the biggest concern. CSW relayed message to MD. Daughter indicated they were going to get home health established at [facility] with [facility] (patient just moved in to ALF on 10/16). CSW provided referral to [facility], they can pick up patient when she returns to ALF. Awaiting therapy recommendations and progress for disposition.        Expected Discharge Plan: Assisted Living Barriers to Discharge: Continued Medical Work up   Patient Goals and CMS Choice Patient states their goals for this hospitalization and ongoing recovery are:: patient unable to participate in goal setting, not fully oriented CMS Medicare.gov Compare Post Acute Care list provided to:: Patient Represenative (must comment) Choice offered to / list presented to : Adult Children  Expected Discharge Plan and Services Expected Discharge Plan: Assisted Living     Post Acute Care Choice: Home Health Living arrangements for the past 2 months: [place], [place]                           HH Arranged: PT,  OT, Speech Therapy HH Agency: [facility] Date [place]: 05/12/22   Representative spoke with at [facility]: Claiborne Billings  Prior Living Arrangements/Services Living arrangements for the past 2 months: [place], [place] Lives with:: Facility Resident Patient language and need for interpreter reviewed:: No Do you feel safe going back to the place where you live?: Yes      Need for Family Participation in Patient Care: Yes (Comment) Care giver support system in place?: Yes (comment) Current home services: DME Criminal Activity/Legal Involvement Pertinent to Current Situation/Hospitalization: No - Comment as needed  Activities of Daily Living      Permission Sought/Granted Permission sought to share information with : Facility Sport and exercise psychologist, Family Supports Permission granted to share information with : Yes, Verbal Permission Granted  Share Information with NAME: Earnest Bailey  Permission granted to share info w AGENCY: [facility], [facility]  Permission granted to share info w Relationship: Daughter     Emotional Assessment Appearance:: Appears stated age Attitude/Demeanor/Rapport: Unable to Assess Affect (typically observed): Unable to Assess Orientation: : Oriented to Self, Oriented to Place, Oriented to Situation Alcohol / Substance Use: Not Applicable Psych Involvement: No (comment)  Admission diagnosis:  Seizures ([place]) [R56.9] Spells of decreased attentiveness [R68.89] Closed fracture of nasal bone, initial encounter [S02.2XXA] Fall, initial encounter [id number]] Patient Active Problem List   Diagnosis Date Noted   Orthostatic hypotension 05/11/2022   Dizziness 05/11/2022   Abnormal CXR 05/11/2022  Hypertensive urgency 05/11/2022   Elevated liver enzymes 05/11/2022   History of CVA with residual deficit 05/11/2022   (HFpEF) heart failure with preserved ejection fraction (HCC) 05/11/2022   Nasal bone fracture  05/11/2022   Spells of decreased attentiveness    Fall 05/10/2022   Stroke (HCC) 03/25/2022   Urinary tract bacterial infections 02/15/2022   Anxiety    Physical deconditioning 08/04/2021   PAF (paroxysmal atrial fibrillation) (HCC) 08/03/2021   S/P TAVR (transcatheter aortic valve replacement) 08/03/2021   Hyponatremia 07/30/2021   Fluid overload 07/22/2021   Bronchiectasis with acute exacerbation (HCC) 07/22/2021   Other cirrhosis of liver (HCC) 07/22/2021   Pericardial effusion 07/21/2021   Hypertension    Severe aortic stenosis 05/31/2019   Renal insufficiency 05/31/2019   CAD (coronary artery disease) 05/31/2019   Dyslipidemia, goal LDL below 70 05/13/2019   Carotid arterial disease (HCC) 05/13/2019   Takotsubo cardiomyopathy    PCP:  Frederica Kuster, MD Pharmacy:   [facility] - [place], [place] - [address] [place] [address] Phone: [phone] Fax: [phone]  [facility], [place] - [address] [place] [address] Phone: [phone] Fax: [phone]     Social Determinants of Health (SDOH) Interventions    Readmission Risk Interventions    08/04/2021    3:35 PM  Readmission Risk Prevention Plan  Transportation Screening Complete  PCP or Specialist Appt within 5-7 Days Complete  Home Care Screening Complete  Medication Review (RN CM) Complete

## 2022-05-12 NOTE — Procedures (Addendum)
Patient Name: Barbara Mcconnell  MRN: [id number]  Epilepsy Attending: Lora Havens  Referring Physician/Provider: Katy Apo, NP  Duration: 05/11/2022 2340 to 05/12/2022 1100  Patient history: 87 year old patient with recent stroke presents with recurrent episodes of imbalance, dizziness and "zoning out", one of which led to a fall. EEG to evaluate for seizure  Level of alertness: Awake, asleep  AEDs during EEG study: None  Technical aspects: This EEG study was done with scalp electrodes positioned according to the 10-20 International system of electrode placement. Electrical activity was reviewed with band pass filter of 1-70Hz , sensitivity of 7 uV/mm, display speed of 19mm/sec with a 60Hz  notched filter applied as appropriate. EEG data were recorded continuously and digitally stored.  Video monitoring was available and reviewed as appropriate.  Description: The posterior dominant rhythm consists of 9-10 Hz activity of moderate voltage (25-35 uV) seen predominantly in posterior head regions, symmetric and reactive to eye opening and eye closing. Sleep was characterized by vertex waves, sleep spindles (12 to 14 Hz), maximal frontocentral region.  Hyperventilation and photic stimulation were not performed.     IMPRESSION: This study is within normal limits. No seizures or epileptiform discharges were seen throughout the recording.  A normal interictal EEG does not exclude the diagnosis of epilepsy.   Emrik Erhard Barbra Sarks

## 2022-05-12 NOTE — Progress Notes (Signed)
Neurology Progress Note  Brief HPI: Patient with history of PAF on Pradaxa, recent left MCA stroke, CAD, HTN, HLD, ICA stenosis, Takotsubo cardiomyopathy and TAVR presents with recurrent episodes of dizziness and imbalance which often occur when she is standing up.  She has also had spells of "zoning out" which have raised concern for seizures.  MRI brain was negative for acute stroke.  Subjective: Patient sitting in bed watching TV in NAD. No family at bedside. LTM running- read pending.  Patient with expressive aphasia. Patient complains of left sided neck/shoulder pain and would like a heat pack. No new neurological events overnight. She is requesting EEG to be removed   Exam: Vitals:   05/12/22 0356 05/12/22 0400  BP: 121/65   Pulse: 81   Resp: 19 19  Temp: (!) 97.5 F (36.4 C)   SpO2: 96% 96%   Gen: In bed, NAD Resp: non-labored breathing, no acute distress Abd: soft, nt  Neuro: Mental Status: Alert and oriented x2 to self, birth date, expressive aphasia, follows commands Cranial Nerves:PERRL, EOMI, facial sensation symmetrical, face symmetrical,hearing intact to voice, phonation normal, shoulder shrug symmetrical and tongue midline Motor: Normal and symmetrical antigravity strength in bilateral arms and legs Sensory:intact to light touch throughout and symmetrical Gait: Deferred  Pertinent Labs:    Latest Ref Rng & Units 05/12/2022    3:11 AM 05/10/2022    3:32 PM 03/30/2022    5:01 AM  CBC  WBC 4.0 - 10.5 K/uL 7.4  8.0  6.9   Hemoglobin 12.0 - 15.0 g/dL 8.8  67.1  24.5   Hematocrit 36.0 - 46.0 % 27.0  34.2  31.1   Platelets 150 - 400 K/uL 216  280  265        Latest Ref Rng & Units 05/11/2022    9:58 AM 05/10/2022    3:32 PM 03/30/2022    5:01 AM  BMP  Glucose 70 - 99 mg/dL 809  92  983   BUN 8 - 23 mg/dL 12  19  16    Creatinine 0.44 - 1.00 mg/dL  3.82  5.05   Sodium 135 - 145 mmol/L 134  133  135   Potassium 3.5 - 5.1 mmol/L 3.5  4.1  3.7   Chloride 98 -  111 mmol/L 100  98  100   CO2 22 - 32 mmol/L 27  28  27    Calcium 8.9 - 10.3 mg/dL 9.1  3.97  9.2      Imaging Reviewed:  CT head:  Old left MCA stroke, left scalp hematoma  MRI brain: No acute abnormality, evolving left MCA infarct, left frontal scalp hematoma  LTM EEG: This study is within normal limits. No seizures or epileptiform discharges were seen throughout the recording.   Assessment: 87 year old patient with recent stroke presents with recurrent episodes of imbalance, dizziness and "zoning out", one of which led to a fall.  MRI was negative for acute stroke.  Episodes may be presyncopal in nature, possibly related to orthostatic hypotension.  Patient's daughter states that her blood pressure has been widely variable over the last month, making this more likely.  May also be caused by seizure activity in the setting of recent stroke.  Impression: Presyncopal/Syncopal  episodes due to orthostatic hypotension and possibly related to known history of sinus pauses   Recommendations: 1)Obtain full orthostatic vital signs (lying, sitting and standing) 2) Will discontinue LTM  3) Neurology will continue to follow    67.3 A  Genoveva Ill, ACNPC-AG [facility] See Amion for schedule and pager information 05/12/2022 8:03 AM

## 2022-05-12 NOTE — Plan of Care (Signed)

## 2022-05-12 NOTE — Progress Notes (Addendum)
Rounding Note    Patient Name: Barbara Mcconnell Date of Encounter: 05/12/2022  [facility] Cardiologist: Peter Swazil[place], MD   Subjective   No seizure activity on EEG. Normal rhythm on telemetry. BP remains in acceptable range 121/65-152/61. Has not had markedly elevated SBP since she moved up from the ED.  Inpatient Medications    Scheduled Meds:  dabigatran  150 mg Oral Q12H   escitalopram  5 mg Oral Daily   furosemide  10 mg Oral BID   guaiFENesin  600 mg Oral BID   lidocaine  2 patch Transdermal Q24H   lisinopril  1.25 mg Oral Daily   loratadine  10 mg Oral Daily   melatonin  5 mg Oral QHS   montelukast  10 mg Oral QPC supper   rosuvastatin  20 mg Oral Daily   sodium chloride flush  3 mL Intravenous Q12H   tuberculin  5 Units Intradermal Once   Continuous Infusions:  PRN Meds: acetaminophen **OR** acetaminophen, albuterol, docusate sodium, famotidine, ondansetron **OR** ondansetron (ZOFRAN) IV   Vital Signs    Vitals:   05/11/22 2045 05/12/22 0020 05/12/22 0356 05/12/22 0400  BP: (!) 151/62 (!) 131/55 121/65   Pulse: 89 84 81   Resp:  18 19 19   Temp:  98.3 F (36.8 C) (!) 97.5 F (36.4 C)   TempSrc:  Oral Axillary   SpO2: 95% 95% 96% 96%    Intake/Output Summary (Last 24 hours) at 05/12/2022 0954 Last data filed at 05/12/2022 0111 Gross per 24 hour  Intake 100 ml  Output --  Net 100 ml      05/06/2022    3:36 PM 04/28/2022   10:03 AM 04/12/2022   10:27 AM  Last 3 Weights  Weight (lbs) 117 lb 8 oz 118 lb 119 lb 6.4 oz  Weight (kg) 53.298 kg 53.524 kg 54.159 kg      Telemetry    NSR - Personally Reviewed  ECG    No new tracing - Personally Reviewed  Physical Exam  Left periorbital hematoma GEN: No acute distress.   Neck: No JVD Cardiac: RRR, no murmurs, rubs, or gallops.  Respiratory: Clear to auscultation bilaterally. GI: Soft, nontender, non-distended  MS: No edema; No deformity. Neuro:  Nonfocal  Psych: Normal affect   Labs     High Sensitivity Troponin:  No results for input(s): "TROPONINIHS" in the last 720 hours.   Chemistry Recent Labs  Lab 05/10/22 1532 05/11/22 0958  NA 133* 134*  K 4.1 3.5  CL 98 100  CO2 28 27  GLUCOSE 92 158*  BUN 19 12  CREATININE 0.96 0.99  CALCIUM 10.0 9.1  PROT 7.7 6.1*  ALBUMIN 4.3 3.3*  AST 53* 50*  ALT 30 26  ALKPHOS 53 47  BILITOT 1.5* 1.7*  GFRNONAA 58* 56*  ANIONGAP 7 7    Lipids No results for input(s): "CHOL", "TRIG", "HDL", "LABVLDL", "LDLCALC", "CHOLHDL" in the last 168 hours.  Hematology Recent Labs  Lab 05/10/22 1532 05/12/22 0311  WBC 8.0 7.4  RBC 3.73* 2.98*  HGB 10.8* 8.8*  HCT 34.2* 27.0*  MCV 91.7 90.6  MCH 29.0 29.5  MCHC 31.6 32.6  RDW 14.5 14.1  PLT 280 216   Thyroid  Recent Labs  Lab 05/10/22 1532  TSH 0.623    BNPNo results for input(s): "BNP", "PROBNP" in the last 168 hours.  DDimer No results for input(s): "DDIMER" in the last 168 hours.   Radiology    Overnight EEG  with video  Result Date: 05/12/2022 Charlsie Quest, MD     05/12/2022  9:44 AM Patient Name: Barbara Mcconnell MRN: [id number] Epilepsy Attending: Charlsie Quest Referring Physician/Provider: Marjorie Smolder, NP Duration: 05/11/2022 2340 to 05/12/2022 0945 Patient history: 87 year old patient with recent stroke presents with recurrent episodes of imbalance, dizziness and "zoning out", one of which led to a fall. EEG to evaluate for seizure Level of alertness: Awake, asleep AEDs during EEG study: None Technical aspects: This EEG study was done with scalp electrodes positioned according to the 10-20 International system of electrode placement. Electrical activity was reviewed with band pass filter of 1-70Hz , sensitivity of 7 uV/mm, display speed of 41mm/sec with a  notched filter applied as appropriate. EEG data were recorded continuously and digitally stored.  Video monitoring was available and reviewed as appropriate. Description: The posterior dominant  rhythm consists of 9-10 Hz activity of moderate voltage (25-35 uV) seen predominantly in posterior head regions, symmetric and reactive to eye opening and eye closing. Sleep was characterized by vertex waves, sleep spindles (12 to 14 Hz), maximal frontocentral region.  Hyperventilation and photic stimulation were not performed.   IMPRESSION: This study is within normal limits. No seizures or epileptiform discharges were seen throughout the recording. A normal interictal EEG does not exclude the diagnosis of epilepsy. Charlsie Quest   MR CERVICAL SPINE WO CONTRAST  Result Date: 05/11/2022 CLINICAL DATA:  Cervical radiculopathy, no red flags EXAM: MRI CERVICAL SPINE WITHOUT CONTRAST TECHNIQUE: Multiplanar, multisequence MR imaging of the cervical spine was performed. No intravenous contrast was administered. COMPARISON:  CT cervical spine May 10, 2022. FINDINGS: Alignment: Mild anterolisthesis of C2 on C3 and C3 on C4. Vertebrae: Degenerative/discogenic endplate signal changes in the lower cervical spine. No specific evidence of acute fracture or discitis/osteomyelitis. No suspicious bone lesions. Cord: Normal cord signal. Posterior Fossa, vertebral arteries, paraspinal tissues: Visualized vertebral artery flow voids are maintained. No evidence of acute abnormality in the visualized posterior fossa. Disc levels: C2-C3: Small posterior disc osteophyte complex with right greater than left facet and uncovertebral hypertrophy. Resulting mild to moderate right and mild left foraminal stenosis. Mild canal stenosis. C3-C4: Posterior disc osteophyte complex with bilateral facet and uncovertebral hypertrophy. Resulting severe bilateral foraminal stenosis. Mild to moderate canal stenosis. C4-C5: Posterior disc osteophyte complex with bilateral facet uncovertebral hypertrophy. Resulting moderate bilateral foraminal stenosis. Mild to moderate canal stenosis. C5-C6: Posterior disc osteophyte complex with right greater  than left facet and uncovertebral hypertrophy. Resulting severe right foraminal stenosis. Mild canal stenosis. Patent left foramen. C6-C7: Posterior disc osteophyte complex with right greater than left facet and uncovertebral hypertrophy. Resulting moderate right foraminal stenosis. Patent canal and left foramen. C7-T1: No significant disc protrusion, foraminal stenosis, or canal stenosis. IMPRESSION: 1. Severe foraminal stenosis bilaterally at C3-C4 and on the right at C5-C6. Moderate foraminal stenosis on the right at C6-C7. Milder multilevel foraminal stenosis is detailed above. 2. Mild to moderate canal stenosis at C3-C4 and C4-C5. Mild canal stenosis at C2-C3 and C5-C6. Electronically Signed   By: Feliberto Harts M.D.   On: 05/11/2022 15:11   CT CHEST WO CONTRAST  Result Date: 05/11/2022 CLINICAL DATA:  Respiratory illness.  Cough.  Recent fall EXAM: CT CHEST WITHOUT CONTRAST TECHNIQUE: Multidetector CT imaging of the chest was performed following the standard protocol without IV contrast. RADIATION DOSE REDUCTION: This exam was performed according to the departmental dose-optimization program which includes automated exposure control, adjustment of the mA and/or kV  according to patient size and/or use of iterative reconstruction technique. COMPARISON:  X-ray 05/10/2022, CT 07/24/2021 FINDINGS: Cardiovascular: Heart size is upper limits of normal. No pericardial effusion. Postsurgical changes of TAVR. Thoracic aorta is nonaneurysmal. Atherosclerotic vascular calcifications of the aorta and coronary arteries. Central pulmonary vasculature is upper limits of normal in size. Mediastinum/Nodes: No enlarged mediastinal or axillary lymph nodes. Thyroid gland, trachea, and esophagus demonstrate no significant findings. Lungs/Pleura: Chronic bronchiectasis within the right middle lobe and lingula with peribronchovascular nodularity, not significantly changed from prior. New site of tree-in-bud nodularity within  the superior segment of the left lower lobe (series 8, image 56). Scattered areas of tree-in-bud nodularity within the bilateral lung apices is unchanged. Left basilar atelectasis. No pleural effusion or pneumothorax. Upper Abdomen: No acute abnormality. Musculoskeletal: Mildly exaggerated thoracic kyphosis. No acute bony or chest wall abnormality. IMPRESSION: 1. New site of tree-in-bud nodularity within the superior segment of the left lower lobe, compatible with an infectious or inflammatory bronchiolitis. 2. Chronic bronchiectasis within the right middle lobe and lingula with peribronchovascular nodularity suggesting sequela of chronic atypical infection such as mycobacteria. 3. Aortic and coronary artery atherosclerosis (ICD10-I70.0). Electronically Signed   By: Davina Poke D.O.   On: 05/11/2022 11:23   MR BRAIN WO CONTRAST  Result Date: 05/11/2022 CLINICAL DATA:  Stroke follow-up EXAM: MRI HEAD WITHOUT CONTRAST TECHNIQUE: Multiplanar, multiecho pulse sequences of the brain and surrounding structures were obtained without intravenous contrast. COMPARISON:  MRI Brain 03/26/22, CT head 05/10/22 FINDINGS: Brain: Redemonstrated subacute infarct in the posterior left frontal lobe with persistent hyperintense signal diffusion-weighted imaging and decreased signal on the ADC map. Compared to prior exam there is increased volume loss in this region. Additional scattered punctate areas of infarction seen on prior MRI are not visualized on this exam. There is a small punctate focus of microhemorrhage in the anterior left temporal lobe. There is no evidence of associated with the area of infarction. There is sequela mild overall chronic microvascular ischemic change. Chronic bilateral cerebellar infarcts. Unchanged size and shape of the ventricular system. Vascular: Normal flow voids. Skull and upper cervical spine: No suspicious osseous lesions are visualized. There is stepwise anterolisthesis of C2 on C3, C3 on  C4, and C4 on C5. Sinuses/Orbits: Bilateral lens replacement. Other: There is a small left frontal scalp hematoma measuring up to 1.6 cm. IMPRESSION: 1. No acute intracranial abnormality 2. Evolving left MCA territory infarct with increased volume loss. No new infarct or hemorrhage. 3. Small left frontal scalp hematoma measuring up to 1.6 cm. Electronically Signed   By: Marin Roberts M.D.   On: 05/11/2022 09:30   CT MAXILLOFACIAL WO CONTRAST  Result Date: 05/10/2022 CLINICAL DATA:  Trauma, fall EXAM: CT MAXILLOFACIAL WITHOUT CONTRAST TECHNIQUE: Multidetector CT imaging of the maxillofacial structures was performed. Multiplanar CT image reconstructions were also generated. RADIATION DOSE REDUCTION: This exam was performed according to the departmental dose-optimization program which includes automated exposure control, adjustment of the mA and/or kV according to patient size and/or use of iterative reconstruction technique. COMPARISON:  None Available. FINDINGS: Osseous: Fracture is seen in the left ala of nasal bones with 1 mm offset in alignment. No other fractures are seen. Degenerative changes are noted in left TMJ. Orbits: Optic globes appear symmetrical. Retrobulbar soft tissues are unremarkable. There is no demonstrable blowout fracture in the orbits. Sinuses: There are no air-fluid levels or significant mucosal thickening. Soft tissues: There is large subcutaneous hematoma in the left periorbital region extending to the left frontal  scalp. Limited intracranial: Unremarkable. IMPRESSION: There is slightly displaced fracture in the left ala of nasal bones. No other fractures are seen in the facial bones. Significant degenerative changes are noted in left TMJ. Optic globes and retrobulbar soft tissues are unremarkable. There are no air-fluid levels or significant mucosal thickening in paranasal sinuses. Electronically Signed   By: Ernie Avena M.D.   On: 05/10/2022 16:49   CT Cervical Spine Wo  Contrast  Result Date: 05/10/2022 CLINICAL DATA:  Trauma, fall EXAM: CT CERVICAL SPINE WITHOUT CONTRAST TECHNIQUE: Multidetector CT imaging of the cervical spine was performed without intravenous contrast. Multiplanar CT image reconstructions were also generated. RADIATION DOSE REDUCTION: This exam was performed according to the departmental dose-optimization program which includes automated exposure control, adjustment of the mA and/or kV according to patient size and/or use of iterative reconstruction technique. COMPARISON:  Trauma, fall FINDINGS: Alignment: There is mild anterolisthesis at C2-C3 and C3-C4 levels, possibly suggesting previous ligament injury and facet degeneration. There is mild dextroscoliosis. Skull base and vertebrae: No recent fracture is seen. Degenerative changes are noted. Soft tissues and spinal canal: There is extrinsic pressure over the ventral margin of thecal sac caused by posterior bony spurs and bulging of annulus at multiple levels. Disc levels: There is encroachment of neural foramina by bony spurs and facet hypertrophy from C2 to C7 levels. Upper chest: Pleural thickening is seen in the apices. Few blebs are noted. There is peribronchial thickening, possibly suggesting bronchitis. Other: There is inhomogeneous attenuation in thyroid. IMPRESSION: No recent fracture is seen in cervical spine. Cervical spondylosis with encroachment of neural foramina from C2-C7 levels. Electronically Signed   By: Ernie Avena M.D.   On: 05/10/2022 16:42   CT Head Wo Contrast  Result Date: 05/10/2022 CLINICAL DATA:  Trauma, fall EXAM: CT HEAD WITHOUT CONTRAST TECHNIQUE: Contiguous axial images were obtained from the base of the skull through the vertex without intravenous contrast. RADIATION DOSE REDUCTION: This exam was performed according to the departmental dose-optimization program which includes automated exposure control, adjustment of the mA and/or kV according to patient size  and/or use of iterative reconstruction technique. COMPARISON:  Previous CT done on 03/25/2022 and MR brain done on 03/26/2022 FINDINGS: Brain: There are no signs of intracranial bleeding. There is a 3 cm area of low attenuation corresponding to the acute infarct seen in the left parietal lobe in the previous MRI. No other new focal abnormalities are noted. Cortical sulci are prominent. Vascular: Unremarkable. Skull: There is subcutaneous hematoma in the left frontal scalp and left periorbital region. Sinuses/Orbits: There are no air-fluid levels in the paranasal sinuses. Other: None. IMPRESSION: No acute intracranial findings are seen. 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 Atrophy. There is left frontal scalp hematoma. No fracture is seen in calvarium. Electronically Signed   By: Ernie Avena M.D.   On: 05/10/2022 16:36   DG Chest 2 View  Result Date: 05/10/2022 CLINICAL DATA:  Fall, altered mental status EXAM: CHEST - 2 VIEW COMPARISON:  Radiograph 111 20,023, CT chest 07/24/2021 FINDINGS: Normal cardiac silhouette. No pneumothorax. No rib fracture identified. Increased nodular density in the RIGHT mid lung compared to prior radiograph. Interval reduction in RIGHT pleural effusion and LEFT pleural effusion. IMPRESSION: 1. Increased nodular density in the RIGHT mid lung could represent infection, neoplasm, or increased pleural thickening seen on comparison CT. 2. Improvement bilateral pleural effusions. 3. No evidence of thoracic trauma. Electronically Signed   By: Loura Halt.D.  On: 05/10/2022 16:02    Cardiac Studies   Cardiac catheterization December 2022   RIGHT/LEFT HEART CATH AND CORONARY ANGIOGRAPHY    Conclusion       Prox RCA lesion is 50% stenosed.   Prox Cx to Mid Cx lesion is 25% stenosed.   Prox LAD to Mid LAD lesion is 25% stenosed.   Hemodynamic findings consistent with pulmonary hypertension.   Stable, mild to moderate coronary artery  disease.  No indication for PCI.   Unable to cross aortic valve, consistent with worsening noted on echocardiogram.  Continue plans for TAVR w/u.       Echocardiogram 03/26/2022     1. Left ventricular ejection fraction, by estimation, is 65 to 70%. The  left ventricle has normal function. The left ventricle has no regional  wall motion abnormalities. There is moderate left ventricular hypertrophy.  Left ventricular diastolic  parameters are indeterminate.   2. Right ventricular systolic function is normal. The right ventricular  size is normal. Tricuspid regurgitation signal is inadequate for assessing  PA pressure.   3. Left atrial size was moderately dilated.   4. The mitral valve is degenerative. Trivial mitral valve regurgitation.  Mild mitral stenosis. The mean mitral valve gradient is 5.0 mmHg with  average heart rate of 68 bpm. Severe mitral annular calcification.   5. The abnormal color flow signal seen previously below the prosthetic  aortic valve is seen again on this exam, color flow in multiple views  suggestive of valvular regurgitation, though it cannot be demonstrated  with spectral Doppler, which would  support color flow artifact, possibly from stent frame of valve. No  evidence of prosthetic valve obstruction or significant regurgitation.      The aortic valve has been repaired/replaced. Aortic valve  regurgitation is mild. There is a 23 mm Sapien prosthetic (TAVR) valve  present in the aortic position. Procedure Date: 08/03/21. Echo findings are  consistent with likely stable structure and  function of the aortic valve prosthesis. Aortic valve area, by VTI  measures 2.58 cm. Aortic valve mean gradient measures 11.0 mmHg. Aortic  valve Vmax measures 1.98 m/s. Aortic valve acceleration time measures 85  msec.   6. The inferior vena cava is normal in size with greater than 50%  respiratory variability, suggesting right atrial pressure of 3 mmHg.    Carotid  duplex 03/26/2022   Right Carotid: Velocities in the right ICA are consistent with a 40-59%                 stenosis.   Left Carotid: Velocities in the left ICA are consistent with a 1-39%  stenosis.   Vertebrals:  Bilateral vertebral arteries demonstrate antegrade flow.  Subclavians: Normal flow hemodynamics were seen in bilateral subclavian               arteries.   *See table(s) above for measurements and observations.     Event monitor January 2023   Patch Wear Time:  13 days and 20 hours (2023-01-11T10:54:50-0500 to 2023-01-25T06:56:22-0500)   Patient had a min HR of 33 bpm, max HR of 197 bpm, and avg HR of 84 bpm. Predominant underlying rhythm was Sinus Rhythm.    EVENTS: 18 Supraventricular Tachycardia runs occurred, the run with the fastest interval lasting 5 beats with a max rate of 197 bpm, the longest lasting 9.2 secs with an avg rate of 140 bpm.    Atrial Fibrillation/Flutter occurred (1% burden), ranging from 90-177 bpm (avg of 136 bpm), the  longest lasting 3 hours 41 mins with an avg rate of 136 bpm.    5 Pauses occurred, the longest lasting 6.1 secs (during sleep).    Atrial Fibrillation/Flutter was detected within +/- 45 seconds of symptomatic patient event(s). Isolated SVEs were rare (<1.0%), SVE Couplets were rare (<1.0%), and SVE Triplets were rare (<1.0%). Isolated VEs were rare (<1.0%), VE Couplets were rare (<1.0%), and no VE Triplets were present.   No ventricular tachyarrhythmias or clinically significant bradyarrhythmias were detected.   Patient is already on anticoagulation for paroxysmal atrial fibrillation.   Head and neck CT angiography September 2023   CTA neck:   1. The common carotid and internal carotid arteries are patent within the neck. Atherosclerotic plaque, bilaterally. Most notably, there is a severe (estimated 80-90% stenosis) at the origin of the right ICA. 2. Vertebral arteries patent within the neck. Mild atherosclerotic narrowing  at the origin of the dominant left vertebral artery. 3.  Aortic Atherosclerosis (ICD10-I70.0). 4. Bronchiectasis and scattered tree-in-bud pulmonary opacities within the bilateral upper lobes. The tree-in-bud opacities likely reflect sequelae of chronic infection. 5. Cervical spondylosis.   CTA head:   1. Occluded proximal-to-mid M2 left MCA vessel. 2. Additional intracranial atherosclerotic disease, as described. Most notably, there is a severe stenosis within the distal V4 right vertebral artery (the non-dominant vertebral artery).   CT perfusion head:   The perfusion software identifies a 12 mL region of critically hypoperfused parenchyma within the left MCA vascular territory. No core infarct is identified. Reported mismatch volume: 12 mL.   Patient Profile     87 y.o. female with paroxysmal atrial fibrillation and recent L MCA embolic stroke, R internal carotid stenosis, heart failure with preserved ejection fraction, history of TAVR, previous incidentally noted sinus pauses >3", HTN, HLP, admitted after a fall without syncope Assessment & Plan    Recurrent falls/injury: likely a multifactorial problem.  Previous ambulatory rhythm monitor showed asymptomatic nocturnal pauses (post TAVR, on beta blocker).  She has orthostatic hypotension: will need to tolerate higher systolic blood pressure, up to SBP of 160.  She does have carotid stenosis although severity on most recent ultrasound is not as striking as it was on the CT angiogram from September.  It is possible that she also has some stenoses in the smaller branches in the head, further increasing impacts of episodes of hypotension.  Finally, she clearly has some problems with normal gait and balance as a consequence of her stroke.  May need specialized physical therapy, geared to improved balance. ASCVD: Severe stenosis described by CT angiography in the right internal carotid artery (contralateral to her recent embolic stroke) as  well as stenosis of the dominant left vertebral artery.  The presence of these lesions may increase the impact of episodes of orthostatic hypotension. Recent embolic stroke: This occurred while she was receiving reduced dose Eliquis (appropriate for age and small body size) and she has been switched to Pradaxa.  Despite her falls and injuries, I believe the risk of recurrent embolic stroke still outweighs the risk of injuries and bleeding. Paroxysmal atrial fibrillation: Currently maintaining sinus rhythm.  The burden of arrhythmia appears to be low (it was 1% on a monitor in January).  To date, I do not think we have demonstrated a connection between her tachyarrhythmia and the episodes of confusion or or injuries.  Beta blocker stopped due to sinus pauses reported on past monitor. Sinus pauses: Notably, these were recorded incidentally on her post TAVR monitor and occurred only  during sleep.  These were not due to AV block, but rather what appears to be sinus node arrest. Have stopped her beta-blocker (Unfortunately, this could lead to issues with RVR during atrial fibrillation).  No overt proof for tachycardia-bradycardia syndrome (the pauses are not postconversion pauses), but ultimately the appropriate treatment may be implantation of a dual-chamber permanent pacemaker.  To date we have not had clear proof of an association between her spells of disorientation or falls and arrhythmia.  She has not had frank syncope.  Unless severe bradyarrhythmia documented during this hospital stay, recommend repeating a 14 day arrhythmia monitor and follow up with EP. S/P TAVR: Normal function of the bioprosthesis. HTN: Her blood pressure is repeatedly described as being very volatile.  Recommend tolerating systolic blood pressure up to 161-096 mmHg while we clarify the mechanism of her falls. HFpEF: Appears euvolemic, clinically compensated HLP: Widespread evidence of atherosclerosis involving the carotid arteries,  branches of the abdominal aorta, nonobstructive CAD.  Recommend target LDL cholesterol less than 045.  Currently on rosuvastatin 20 mg daily.   Tried to call her daughter [name] to discuss the rhythm abnormalities, but unable to reach her (voicemail without name id).  For questions or updates, please contact [facility] Please consult [email or web address] for contact info under        Signed, Thurmon Fair, MD  05/12/2022, 9:54 AM

## 2022-05-12 NOTE — Progress Notes (Signed)
Mobility Specialist: Progress Note   05/12/22 1733  Orthostatic Lying   BP- Lying 147/69  Pulse- Lying 87  Orthostatic Sitting  BP- Sitting 156/78  Pulse- Sitting 96  Orthostatic Standing at 0 minutes  BP- Standing at 0 minutes 125/71  Orthostatic Standing at 3 minutes  BP- Standing at 3 minutes 126/79  Mobility  Activity Ambulated with assistance in hallway  Level of Assistance Minimal assist, patient does 75% or more  Assistive Device None  Distance Ambulated (ft) 160 ft  Activity Response Tolerated well  Mobility Referral Yes  $Mobility charge 1 Mobility   Post-Mobility: 111 HR, 95% SpO2  Pt received in the bed and agreeable to mobility. Obtained orthostatics and then agreeable to ambulation, no c/o throughout. Required minA to stand. Pt back to bed after session with call bell and phone in reach.   Barbara Mcconnell Mobility Specialist Secure Chat Only

## 2022-05-12 NOTE — Progress Notes (Signed)
Central telemetry called, RN notified patient in VT, patient sitting in bed talking to daughter, VT present on monitor, patient states " I have heartburn and it always happens when I take my Pradaxa" attending MD notified and Cardiology paged.

## 2022-05-12 NOTE — Progress Notes (Signed)
PROGRESS NOTE    Barbara Mcconnell  [id number] DOB: 03/03/1935 DOA: 05/10/2022 PCP: Frederica KusterMiller, Stephen M, MD   Brief Narrative:  Barbara Mcconnell is a 87 y.o. female with medical history significant of history HTN, HLD, CAD, Afib on eliquis, s/p TAVR, takotsubo cardiomyopathy, CVA, anxiety, arthritis, and GERD presents after having a fall.  Recently hospitalized 9/1-9/6 for L MCA infarct with IR intervention due to embolic source despite being on eliquis. Transitioned to Pradaxa previously. Multiple episodes of instability, dizziness and worsening ambulatory dysfunction ultimately culminating in falls with remarkable bruising due to anticoagulation. Concern over recurrent stroke vs seizure at intake - hospitalist called for admission. Neuro consulted for assistance with mental status changes/weakness and cardiology consulted for orthostatic hypotension in the setting of profound cardiac history.  Assessment & Plan:   Principal Problem:   Fall Active Problems:   Orthostatic hypotension   Dizziness   Hypertensive urgency   Abnormal CXR   Hyponatremia   PAF (paroxysmal atrial fibrillation) (HCC)   Elevated liver enzymes   History of CVA with residual deficit   (HFpEF) heart failure with preserved ejection fraction (HCC)   Nasal bone fracture   Carotid arterial disease (HCC)   S/P TAVR (transcatheter aortic valve replacement)   Dyslipidemia, goal LDL below 70   Anxiety  Ambulatory dysfunction, multifactorial Fall with traumatic injury to left ala of nasal bone -Acute CVA ruled out, EEG negative for any seizures -Patient has notable history of TAVR with transient episodes of pauses on previous outpatient telemetry while on beta-blockers -Carotid stenosis -Orthostatic hypotension -Previous stroke likely playing some small role in her symptoms -See below for further details on these items separately   Hypertensive urgency Bradycardia/Sinus pause/Paroxysmal afib Status post  TAVR Orthostatic hypotension -Allow permissive HTN while supine/seated due to profound symptoms upon standing. -Cardiology following, discontinue beta-blockers in the setting of bradycardia and sinus pauses previously noted; sideline EP team to evaluate -Continue low-dose furosemide, lisinopril   Recent prior CVA, without evidence of acute CVA  -MRI without new findings -evolution of prior CVA as expected -Continue PT OT speech -disposition pending evaluation, previously discharged to SNF but ultimately discharged home  ASCVD CAD -Previously noted severe internal carotid artery stenosis, repeat imaging here indicates less severe disease. -Defer to vascular surgery on whether not patient is still appropriate for intervention, given her episodes as above recent CVA, A-fib with failure of Eliquis as well as orthostatic hypotension which may be related to stenosis she certainly has indications for intervention. -On Pradaxa and statin  Abnormal chest imaging, POA -Incidentally noted new left lower lobe inflammation noted on chronic bronchiectasis of RML Given atypical findings questionably consistent with mycobacterial infection TB test is pending -check TB skin testing 05/14/2022 -Procalcitonin negative    Hyponatremia, hypovolemic -Likely secondary to poor p.o. intake and diuretics   Paroxysmal atrial fibrillation on chronic anticoagulation Patient had just recently had left MCA stroke last month and was switched from Eliquis to Pradaxa   Elevated liver enzymes Chronic. Hepatitis A IgM positive   History of CVA with residual deficit Patient suffered left MCA infarct with left M2 occlusion status post interventional radiology intervention with reconstitution of left M3, embolic secondary to A-fib despite being on Eliquis in September.  Patient switched to Pradaxa. -Continue statin and Pradaxa   Heart failure with preserved EF, not in acute exacerbation EF 65-70%   Nasal bone fracture  secondary to fall Patient found to have a slightly displaced fracture of the left ala of nasal bones. -  Patient likely can follow-up in the outpatient setting with ENT if needed   Dyslipidemia Home medication regimen includes Crestor 20 mg daily. -Continue Crestor   Anxiety -Continue Lexapro   DVT prophylaxis: Pradaxa Code Status: DNR Family Communication: Daughter updated over the phone  Status is: Inpatient  Dispo: The patient is from: Home              Anticipated d/c is to: To be determined              Anticipated d/c date is: 48 to 72 hours pending clinical course and possible need for further intervention/procedures              Patient currently not medically stable for discharge  Consultants:  Neurology, cardiology, EP  Procedures:  Pending above  Antimicrobials:  None none  Subjective: No acute issues or events overnight denies nausea vomiting diarrhea constipation headache fevers chills or chest pain  Objective: Vitals:   05/11/22 2045 05/12/22 0020 05/12/22 0356 05/12/22 0400  BP: (!) 151/62 (!) 131/55 121/65   Pulse: 89 84 81   Resp:  18 19 19   Temp:  98.3 F (36.8 C) (!) 97.5 F (36.4 C)   TempSrc:  Oral Axillary   SpO2: 95% 95% 96% 96%    Intake/Output Summary (Last 24 hours) at 05/12/2022 0727 Last data filed at 05/12/2022 0111 Gross per 24 hour  Intake 100 ml  Output --  Net 100 ml   There were no vitals filed for this visit.  Examination:  General:  Pleasantly resting in bed, No acute distress. HEENT:  Normocephalic atraumatic.  Sclerae nonicteric, noninjected.  Extraocular movements intact bilaterally.  Mild aphasia and word finding difficulty Neck:  Without mass or deformity.  Trachea is midline. Lungs:  Clear to auscultate bilaterally without rhonchi, wheeze, or rales. Heart:  Regular rate and rhythm.  Without murmurs, rubs, or gallops. Abdomen:  Soft, nontender, nondistended.  Without guarding or rebound. Extremities: Without  cyanosis, clubbing, edema, or obvious deformity. Vascular:  Dorsalis pedis and posterior tibial pulses palpable bilaterally. Skin:  Warm and dry, no erythema, no ulcerations.  Data Reviewed: I have personally reviewed following labs and imaging studies  CBC: Recent Labs  Lab 05/10/22 1532 05/12/22 0311  WBC 8.0 7.4  NEUTROABS 5.7  --   HGB 10.8* 8.8*  HCT 34.2* 27.0*  MCV 91.7 90.6  PLT 280 216   Basic Metabolic Panel: Recent Labs  Lab 05/10/22 1532 05/11/22 0958  NA 133* 134*  K 4.1 3.5  CL 98 100  CO2 28 27  GLUCOSE 92 158*  BUN 19 12  CREATININE 0.96 0.99  CALCIUM 10.0 9.1   GFR: Estimated Creatinine Clearance: 31.4 mL/min (by C-G formula based on SCr of 0.99 mg/dL). Liver Function Tests: Recent Labs  Lab 05/10/22 1532 05/11/22 0958  AST 53* 50*  ALT 30 26  ALKPHOS 53 47  BILITOT 1.5* 1.7*  PROT 7.7 6.1*  ALBUMIN 4.3 3.3*   No results for input(s): "LIPASE", "AMYLASE" in the last 168 hours. Recent Labs  Lab 05/10/22 1532  AMMONIA 19   Coagulation Profile: No results for input(s): "INR", "PROTIME" in the last 168 hours. Cardiac Enzymes: No results for input(s): "CKTOTAL", "CKMB", "CKMBINDEX", "TROPONINI" in the last 168 hours. BNP (last 3 results) No results for input(s): "PROBNP" in the last 8760 hours. HbA1C: No results for input(s): "HGBA1C" in the last 72 hours. CBG: Recent Labs  Lab 05/10/22 1515  GLUCAP 80   Lipid Profile:  No results for input(s): "CHOL", "HDL", "LDLCALC", "TRIG", "CHOLHDL", "LDLDIRECT" in the last 72 hours. Thyroid Function Tests: Recent Labs    05/10/22 1532  TSH 0.623   Anemia Panel: No results for input(s): "VITAMINB12", "FOLATE", "FERRITIN", "TIBC", "IRON", "RETICCTPCT" in the last 72 hours. Sepsis Labs: Recent Labs  Lab 05/11/22 0958  PROCALCITON <0.10    No results found for this or any previous visit (from the past 240 hour(s)).       Radiology Studies: MR CERVICAL SPINE WO CONTRAST  Result  Date: 05/11/2022 CLINICAL DATA:  Cervical radiculopathy, no red flags EXAM: MRI CERVICAL SPINE WITHOUT CONTRAST TECHNIQUE: Multiplanar, multisequence MR imaging of the cervical spine was performed. No intravenous contrast was administered. COMPARISON:  CT cervical spine May 10, 2022. FINDINGS: Alignment: Mild anterolisthesis of C2 on C3 and C3 on C4. Vertebrae: Degenerative/discogenic endplate signal changes in the lower cervical spine. No specific evidence of acute fracture or discitis/osteomyelitis. No suspicious bone lesions. Cord: Normal cord signal. Posterior Fossa, vertebral arteries, paraspinal tissues: Visualized vertebral artery flow voids are maintained. No evidence of acute abnormality in the visualized posterior fossa. Disc levels: C2-C3: Small posterior disc osteophyte complex with right greater than left facet and uncovertebral hypertrophy. Resulting mild to moderate right and mild left foraminal stenosis. Mild canal stenosis. C3-C4: Posterior disc osteophyte complex with bilateral facet and uncovertebral hypertrophy. Resulting severe bilateral foraminal stenosis. Mild to moderate canal stenosis. C4-C5: Posterior disc osteophyte complex with bilateral facet uncovertebral hypertrophy. Resulting moderate bilateral foraminal stenosis. Mild to moderate canal stenosis. C5-C6: Posterior disc osteophyte complex with right greater than left facet and uncovertebral hypertrophy. Resulting severe right foraminal stenosis. Mild canal stenosis. Patent left foramen. C6-C7: Posterior disc osteophyte complex with right greater than left facet and uncovertebral hypertrophy. Resulting moderate right foraminal stenosis. Patent canal and left foramen. C7-T1: No significant disc protrusion, foraminal stenosis, or canal stenosis. IMPRESSION: 1. Severe foraminal stenosis bilaterally at C3-C4 and on the right at C5-C6. Moderate foraminal stenosis on the right at C6-C7. Milder multilevel foraminal stenosis is detailed  above. 2. Mild to moderate canal stenosis at C3-C4 and C4-C5. Mild canal stenosis at C2-C3 and C5-C6. Electronically Signed   By: Margaretha Sheffield M.D.   On: 05/11/2022 15:11   CT CHEST WO CONTRAST  Result Date: 05/11/2022 CLINICAL DATA:  Respiratory illness.  Cough.  Recent fall EXAM: CT CHEST WITHOUT CONTRAST TECHNIQUE: Multidetector CT imaging of the chest was performed following the standard protocol without IV contrast. RADIATION DOSE REDUCTION: This exam was performed according to the departmental dose-optimization program which includes automated exposure control, adjustment of the mA and/or kV according to patient size and/or use of iterative reconstruction technique. COMPARISON:  X-ray 05/10/2022, CT 07/24/2021 FINDINGS: Cardiovascular: Heart size is upper limits of normal. No pericardial effusion. Postsurgical changes of TAVR. Thoracic aorta is nonaneurysmal. Atherosclerotic vascular calcifications of the aorta and coronary arteries. Central pulmonary vasculature is upper limits of normal in size. Mediastinum/Nodes: No enlarged mediastinal or axillary lymph nodes. Thyroid gland, trachea, and esophagus demonstrate no significant findings. Lungs/Pleura: Chronic bronchiectasis within the right middle lobe and lingula with peribronchovascular nodularity, not significantly changed from prior. New site of tree-in-bud nodularity within the superior segment of the left lower lobe (series 8, image 56). Scattered areas of tree-in-bud nodularity within the bilateral lung apices is unchanged. Left basilar atelectasis. No pleural effusion or pneumothorax. Upper Abdomen: No acute abnormality. Musculoskeletal: Mildly exaggerated thoracic kyphosis. No acute bony or chest wall abnormality. IMPRESSION: 1. New site of tree-in-bud nodularity  within the superior segment of the left lower lobe, compatible with an infectious or inflammatory bronchiolitis. 2. Chronic bronchiectasis within the right middle lobe and lingula  with peribronchovascular nodularity suggesting sequela of chronic atypical infection such as mycobacteria. 3. Aortic and coronary artery atherosclerosis (ICD10-I70.0). Electronically Signed   By: Duanne Guess D.O.   On: 05/11/2022 11:23   MR BRAIN WO CONTRAST  Result Date: 05/11/2022 CLINICAL DATA:  Stroke follow-up EXAM: MRI HEAD WITHOUT CONTRAST TECHNIQUE: Multiplanar, multiecho pulse sequences of the brain and surrounding structures were obtained without intravenous contrast. COMPARISON:  MRI Brain 03/26/22, CT head 05/10/22 FINDINGS: Brain: Redemonstrated subacute infarct in the posterior left frontal lobe with persistent hyperintense signal diffusion-weighted imaging and decreased signal on the ADC map. Compared to prior exam there is increased volume loss in this region. Additional scattered punctate areas of infarction seen on prior MRI are not visualized on this exam. There is a small punctate focus of microhemorrhage in the anterior left temporal lobe. There is no evidence of associated with the area of infarction. There is sequela mild overall chronic microvascular ischemic change. Chronic bilateral cerebellar infarcts. Unchanged size and shape of the ventricular system. Vascular: Normal flow voids. Skull and upper cervical spine: No suspicious osseous lesions are visualized. There is stepwise anterolisthesis of C2 on C3, C3 on C4, and C4 on C5. Sinuses/Orbits: Bilateral lens replacement. Other: There is a small left frontal scalp hematoma measuring up to 1.6 cm. IMPRESSION: 1. No acute intracranial abnormality 2. Evolving left MCA territory infarct with increased volume loss. No new infarct or hemorrhage. 3. Small left frontal scalp hematoma measuring up to 1.6 cm. Electronically Signed   By: Lorenza Cambridge M.D.   On: 05/11/2022 09:30   CT MAXILLOFACIAL WO CONTRAST  Result Date: 05/10/2022 CLINICAL DATA:  Trauma, fall EXAM: CT MAXILLOFACIAL WITHOUT CONTRAST TECHNIQUE: Multidetector CT imaging  of the maxillofacial structures was performed. Multiplanar CT image reconstructions were also generated. RADIATION DOSE REDUCTION: This exam was performed according to the departmental dose-optimization program which includes automated exposure control, adjustment of the mA and/or kV according to patient size and/or use of iterative reconstruction technique. COMPARISON:  None Available. FINDINGS: Osseous: Fracture is seen in the left ala of nasal bones with 1 mm offset in alignment. No other fractures are seen. Degenerative changes are noted in left TMJ. Orbits: Optic globes appear symmetrical. Retrobulbar soft tissues are unremarkable. There is no demonstrable blowout fracture in the orbits. Sinuses: There are no air-fluid levels or significant mucosal thickening. Soft tissues: There is large subcutaneous hematoma in the left periorbital region extending to the left frontal scalp. Limited intracranial: Unremarkable. IMPRESSION: There is slightly displaced fracture in the left ala of nasal bones. No other fractures are seen in the facial bones. Significant degenerative changes are noted in left TMJ. Optic globes and retrobulbar soft tissues are unremarkable. There are no air-fluid levels or significant mucosal thickening in paranasal sinuses. Electronically Signed   By: Ernie Avena M.D.   On: 05/10/2022 16:49   CT Cervical Spine Wo Contrast  Result Date: 05/10/2022 CLINICAL DATA:  Trauma, fall EXAM: CT CERVICAL SPINE WITHOUT CONTRAST TECHNIQUE: Multidetector CT imaging of the cervical spine was performed without intravenous contrast. Multiplanar CT image reconstructions were also generated. RADIATION DOSE REDUCTION: This exam was performed according to the departmental dose-optimization program which includes automated exposure control, adjustment of the mA and/or kV according to patient size and/or use of iterative reconstruction technique. COMPARISON:  Trauma, fall FINDINGS: Alignment: There is  mild  anterolisthesis at C2-C3 and C3-C4 levels, possibly suggesting previous ligament injury and facet degeneration. There is mild dextroscoliosis. Skull base and vertebrae: No recent fracture is seen. Degenerative changes are noted. Soft tissues and spinal canal: There is extrinsic pressure over the ventral margin of thecal sac caused by posterior bony spurs and bulging of annulus at multiple levels. Disc levels: There is encroachment of neural foramina by bony spurs and facet hypertrophy from C2 to C7 levels. Upper chest: Pleural thickening is seen in the apices. Few blebs are noted. There is peribronchial thickening, possibly suggesting bronchitis. Other: There is inhomogeneous attenuation in thyroid. IMPRESSION: No recent fracture is seen in cervical spine. Cervical spondylosis with encroachment of neural foramina from C2-C7 levels. Electronically Signed   By: Ernie Avena M.D.   On: 05/10/2022 16:42   CT Head Wo Contrast  Result Date: 05/10/2022 CLINICAL DATA:  Trauma, fall EXAM: CT HEAD WITHOUT CONTRAST TECHNIQUE: Contiguous axial images were obtained from the base of the skull through the vertex without intravenous contrast. RADIATION DOSE REDUCTION: This exam was performed according to the departmental dose-optimization program which includes automated exposure control, adjustment of the mA and/or kV according to patient size and/or use of iterative reconstruction technique. COMPARISON:  Previous CT done on 03/25/2022 and MR brain done on 03/26/2022 FINDINGS: Brain: There are no signs of intracranial bleeding. There is a 3 cm area of low attenuation corresponding to the acute infarct seen in the left parietal lobe in the previous MRI. No other new focal abnormalities are noted. Cortical sulci are prominent. Vascular: Unremarkable. Skull: There is subcutaneous hematoma in the left frontal scalp and left periorbital region. Sinuses/Orbits: There are no air-fluid levels in the paranasal sinuses. Other:  None. IMPRESSION: No acute intracranial findings are seen. 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 Atrophy. There is left frontal scalp hematoma. No fracture is seen in calvarium. Electronically Signed   By: Ernie Avena M.D.   On: 05/10/2022 16:36   DG Chest 2 View  Result Date: 05/10/2022 CLINICAL DATA:  Fall, altered mental status EXAM: CHEST - 2 VIEW COMPARISON:  Radiograph 111 20,023, CT chest 07/24/2021 FINDINGS: Normal cardiac silhouette. No pneumothorax. No rib fracture identified. Increased nodular density in the RIGHT mid lung compared to prior radiograph. Interval reduction in RIGHT pleural effusion and LEFT pleural effusion. IMPRESSION: 1. Increased nodular density in the RIGHT mid lung could represent infection, neoplasm, or increased pleural thickening seen on comparison CT. 2. Improvement bilateral pleural effusions. 3. No evidence of thoracic trauma. Electronically Signed   By: Genevive Bi M.D.   On: 05/10/2022 16:02     Scheduled Meds:  dabigatran  150 mg Oral Q12H   escitalopram  5 mg Oral Daily   furosemide  10 mg Oral BID   guaiFENesin  600 mg Oral BID   lidocaine  2 patch Transdermal Q24H   lisinopril  1.25 mg Oral Daily   loratadine  10 mg Oral Daily   melatonin  5 mg Oral QHS   montelukast  10 mg Oral QPC supper   rosuvastatin  20 mg Oral Daily   sodium chloride flush  3 mL Intravenous Q12H   Continuous Infusions:   LOS: 2 days   Time spent:  Azucena Fallen, DO [facility]  If 7PM-7AM, please contact night-coverage [email or web address]  05/12/2022, 7:27 AM

## 2022-05-12 NOTE — Progress Notes (Signed)
LTM EEG discontinued - no skin breakdown at unhook.   

## 2022-05-12 NOTE — Progress Notes (Signed)
TB skin test given, R forearm.

## 2022-05-12 NOTE — Progress Notes (Signed)
SLP Cancellation Note  Patient Details Name: Gay Moncivais MRN: [id number] DOB: 1934/09/05   Cancelled treatment:       Reason Eval/Treat Not Completed: Patient at procedure or test/unavailable (Pt currently working with OT. SLP will follow up later as schedule allows.)  Garv Kuechle I. Hardin Negus, [place], [place] Office number [phone]  Horton Marshall 05/12/2022, 2:37 PM

## 2022-05-12 NOTE — Evaluation (Signed)
Occupational Therapy Evaluation Patient Details Name: Barbara Mcconnell MRN: [id number] DOB: [birth year] Today's Date: 05/12/2022   History of Present Illness 87 yo with recent history of stroke was admitted 10/17 for fall with L eye bruising, no acute changes on MRI.  Pt is suspected of seizures, has carotid stenosis and recent L MCA infarct.  Residual aphasia issues.  PMHx:  L MCA infarct, aphasia, TAVR, atherosclerosis, PAF, anxiety, aortic stenosis, OA, CAD, heart murmur, HTN   Clinical Impression   PTA, pt recently living at ALF. Pt performing sponge bathing at sink on arrival and agreeable to OT eval. Pt presenting with expressive difficulties, decreased problem solving, RUE coordination, memory, safety, and attention. Pt performing UB ADL with set-up to min A and LB ADL with min guard A this session. Per chart, pt was living at ALF, but they have had difficulty with finances due to need for ALF and lack of insurance coverage with therapies. Attempting to contact daughter regarding anticipated discharge location, but no answer; will continue to attempt to contact. Due to decreased safety and cognition, recommending SNF for continued OT services to optimize safety and independence with ADL and IADL.      Recommendations for follow up therapy are one component of a multi-disciplinary discharge planning process, led by the attending physician.  Recommendations may be updated based on patient status, additional functional criteria and insurance authorization.   Follow Up Recommendations  Skilled nursing-short term rehab (<3 hours/day)    Assistance Recommended at Discharge Frequent or constant Supervision/Assistance  Patient can return home with the following A little help with walking and/or transfers;Direct supervision/assist for medications management;Direct supervision/assist for financial management;Assist for transportation;Assistance with cooking/housework;Help with stairs or ramp for entrance;A  little help with bathing/dressing/bathroom    Functional Status Assessment  Patient has had a recent decline in their functional status and demonstrates the ability to make significant improvements in function in a reasonable and predictable amount of time.  Equipment Recommendations  Other (comment) (defer to next venue)    Recommendations for Other Services       Precautions / Restrictions Precautions Precautions: Fall Restrictions Weight Bearing Restrictions: No      Mobility Bed Mobility Overal bed mobility: Needs Assistance Bed Mobility: Sit to Supine       Sit to supine: Min guard   General bed mobility comments: for safety    Transfers Overall transfer level: Needs assistance Equipment used: None Transfers: Sit to/from Stand Sit to Stand: Min guard           General transfer comment: Min guard A for safety      Balance Overall balance assessment: Needs assistance, History of Falls Sitting-balance support: No upper extremity supported, Feet supported Sitting balance-Leahy Scale: Good Sitting balance - Comments: donning socks sititng EOB without support   Standing balance support: No upper extremity supported, During functional activity Standing balance-Leahy Scale: Fair Standing balance comment: Min guard A for pericare during bathing                           ADL either performed or assessed with clinical judgement   ADL Overall ADL's : Needs assistance/impaired Eating/Feeding: Modified independent;Sitting   Grooming: Wash/dry face;Applying deodorant;Brushing hair;Min guard;Sitting;Standing Grooming Details (indicate cue type and reason): performing in sitting and standing. Perseverating on brushing hair Upper Body Bathing: Supervision/ safety;Set up;Sitting Upper Body Bathing Details (indicate cue type and reason): performing during session. Requires set-up and supervision for safety Lower  Body Bathing: Min guard;Sit to/from  stand Lower Body Bathing Details (indicate cue type and reason): Min guard A for safety. Overall fair safety awareness (intentionally making sure towel is under feet to avoid standing in wet area) Upper Body Dressing : Minimal assistance;Sitting Upper Body Dressing Details (indicate cue type and reason): Min A to don new gown due to min difficulty finding arm holes Lower Body Dressing: Min guard;Sit to/from stand Lower Body Dressing Details (indicate cue type and reason): donning socks sitting without support Toilet Transfer: Min guard;Ambulation Toilet Transfer Details (indicate cue type and reason): simulated in room Toileting- Clothing Manipulation and Hygiene: Min guard;Sit to/from stand       Functional mobility during ADLs: Min guard General ADL Comments: Pt reporting she has been using RW, but impulsively walking to bed and back to sink without RW     Vision Baseline Vision/History: 0 No visual deficits Ability to See in Adequate Light: 0 Adequate Patient Visual Report: No change from baseline Vision Assessment?: Vision impaired- to be further tested in functional context Additional Comments: Pt denying any visual changes. Perhaps some decreased attention to R side, but locating items needed for all grooming and bathing tasks during session. Able to see small pieces of glue from EEG in hair brush     Perception     Praxis Praxis Praxis-Other Comments: Some perseveration on certain topics (brushing hair multiple times in a row) and some diffuclty with carrying out desired motor actions with RUE    Pertinent Vitals/Pain Pain Assessment Pain Assessment: Faces Faces Pain Scale: Hurts a little bit Pain Location: L eye where pt landed in fall Pain Descriptors / Indicators: Guarding, Grimacing Pain Intervention(s): Monitored during session     Hand Dominance Right   Extremity/Trunk Assessment Upper Extremity Assessment Upper Extremity Assessment: Generalized weakness;RUE  deficits/detail RUE Deficits / Details: difficulty with coordination of RUE during FM tasks. Requires increased effort and concentration to perform fine motor tasks on command   Lower Extremity Assessment Lower Extremity Assessment: Defer to PT evaluation   Cervical / Trunk Assessment Cervical / Trunk Assessment: Kyphotic (mild)   Communication Communication Communication: Expressive difficulties;HOH   Cognition Arousal/Alertness: Awake/alert Behavior During Therapy: Impulsive Overall Cognitive Status: Impaired/Different from baseline Area of Impairment: Attention, Following commands, Safety/judgement, Problem solving, Orientation, Memory, Awareness                 Orientation Level: Disoriented to, Time, Situation (date; pt aware of where she is but reportings she did not fall and that bruise is from medicationa nd she ran into something) Current Attention Level: Sustained, Selective Memory: Decreased short-term memory Following Commands: Follows one step commands consistently, Follows one step commands with increased time, Follows multi-step commands inconsistently Safety/Judgement: Decreased awareness of safety, Decreased awareness of deficits Awareness: Emergent (aware of difficulty forming words) Problem Solving: Slow processing, Requires verbal cues General Comments: Pt with expressive difficulties and potentially with some receptive difficulties, but seems better in comparrison to prior admission when this OT saw the patient. Pt performing spinge bath at sink on arrival and with good skill to sequence bathing without duplicating or missing steps. Pt was observed to repeat same information multiple times, and demonstrates min frustration with language deficits.     General Comments       Exercises     Shoulder Instructions      Home Living Family/patient expects to be discharged to:: Private residence Living Arrangements: Alone Available Help at Discharge:  Family;Available PRN/intermittently Type of Home:  House Home Access: Level entry     Home Layout: One level     Bathroom Shower/Tub: Teacher, early years/pre: Handicapped height Bathroom Accessibility: No   Home Equipment: Cane - single point;Tub bench   Additional Comments: daughter is overwhelmed, has expenses attached to paying for having set up ALF that pt has yet to use, pt had recent SNF stay but ins stopped paying, plus has her own home still.  Daughter has insufficient income for the expenses she has incurred with dealing with pt  Lives With: Alone    Prior Functioning/Environment Prior Level of Function : Independent/Modified Independent             Mobility Comments: PLOF SPC without help but since stroke is on RW with instability ADLs Comments: recently, has been living at ALF        OT Problem List: Decreased strength;Impaired balance (sitting and/or standing);Impaired UE functional use;Decreased safety awareness;Decreased cognition;Decreased coordination      OT Treatment/Interventions: Self-care/ADL training;Therapeutic activities;Cognitive remediation/compensation;Patient/family education;Balance training;DME and/or AE instruction    OT Goals(Current goals can be found in the care plan section) Acute Rehab OT Goals Patient Stated Goal: bathe OT Goal Formulation: With patient Time For Goal Achievement: 05/26/22 Potential to Achieve Goals: Fair  OT Frequency: Min 2X/week    Co-evaluation              AM-PAC OT "6 Clicks" Daily Activity     Outcome Measure Help from another person eating meals?: None Help from another person taking care of personal grooming?: A Little Help from another person toileting, which includes using toliet, bedpan, or urinal?: A Little Help from another person bathing (including washing, rinsing, drying)?: A Little Help from another person to put on and taking off regular upper body clothing?: A Little Help from  another person to put on and taking off regular lower body clothing?: A Little 6 Click Score: 19   End of Session Equipment Utilized During Treatment: Gait belt Nurse Communication: Mobility status  Activity Tolerance: Patient tolerated treatment well Patient left: in bed;with call bell/phone within reach;with bed alarm set  OT Visit Diagnosis: Unsteadiness on feet (R26.81);Ataxia, unspecified (R27.0);Other symptoms and signs involving cognitive function                Time: 1423-1450 OT Time Calculation (min): 27 min Charges:  OT General Charges $OT Visit: 1 Visit OT Evaluation $OT Eval Moderate Complexity: 1 Mod OT Treatments $Self Care/Home Management : 8-22 mins  Shanda Howells, OTR/L [facility] Acute Rehabilitation Office: [phone]   Lula Olszewski 05/12/2022, 3:59 PM

## 2022-05-12 NOTE — Progress Notes (Signed)
LTM EEG hooked up and running - no initial skin breakdown - push button tested - neuro notified. [facility] monitoring.  

## 2022-05-12 NOTE — Progress Notes (Signed)
LTM maint complete - no skin breakdown under:  FP2

## 2022-05-13 ENCOUNTER — Other Ambulatory Visit: Payer: Self-pay | Admitting: Cardiology

## 2022-05-13 ENCOUNTER — Inpatient Hospital Stay (HOSPITAL_BASED_OUTPATIENT_CLINIC_OR_DEPARTMENT_OTHER)
Admit: 2022-05-13 | Discharge: 2022-05-13 | Disposition: A | Discharge status: Home / Self Care | Payer: Medicare HMO | Attending: Cardiology | Admitting: Cardiology

## 2022-05-13 DIAGNOSIS — R6889 Other general symptoms and signs: Secondary | ICD-10-CM

## 2022-05-13 DIAGNOSIS — I951 Orthostatic hypotension: Secondary | ICD-10-CM | POA: Diagnosis not present

## 2022-05-13 DIAGNOSIS — I480 Paroxysmal atrial fibrillation: Secondary | ICD-10-CM

## 2022-05-13 DIAGNOSIS — I6523 Occlusion and stenosis of bilateral carotid arteries: Secondary | ICD-10-CM | POA: Diagnosis not present

## 2022-05-13 DIAGNOSIS — R42 Dizziness and giddiness: Secondary | ICD-10-CM

## 2022-05-13 DIAGNOSIS — I6930 Unspecified sequelae of cerebral infarction: Secondary | ICD-10-CM | POA: Diagnosis not present

## 2022-05-13 DIAGNOSIS — W19XXXA Unspecified fall, initial encounter: Secondary | ICD-10-CM | POA: Diagnosis not present

## 2022-05-13 DIAGNOSIS — I455 Other specified heart block: Secondary | ICD-10-CM | POA: Diagnosis not present

## 2022-05-13 DIAGNOSIS — I5032 Chronic diastolic (congestive) heart failure: Secondary | ICD-10-CM | POA: Diagnosis not present

## 2022-05-13 LAB — COMPREHENSIVE METABOLIC PANEL
ALT: 22 U/L (ref 0–44)
AST: 38 U/L (ref 15–41)
Albumin: 3.1 g/dL — ABNORMAL LOW (ref 3.5–5.0)
Alkaline Phosphatase: 41 U/L (ref 38–126)
Anion gap: 9 (ref 5–15)
BUN: 15 mg/dL (ref 8–23)
CO2: 25 mmol/L (ref 22–32)
Calcium: 9.1 mg/dL (ref 8.9–10.3)
Chloride: 100 mmol/L (ref 98–111)
Creatinine, Ser: 1.02 mg/dL — ABNORMAL HIGH (ref 0.44–1.00)
GFR, Estimated: 54 mL/min — ABNORMAL LOW (ref >60)
Glucose, Bld: 103 mg/dL — ABNORMAL HIGH (ref 70–99)
Potassium: 3.5 mmol/L (ref 3.5–5.1)
Sodium: 134 mmol/L — ABNORMAL LOW (ref 135–145)
Total Bilirubin: 1.3 mg/dL — ABNORMAL HIGH (ref 0.3–1.2)
Total Protein: 5.6 g/dL — ABNORMAL LOW (ref 6.5–8.1)

## 2022-05-13 LAB — MAGNESIUM: Magnesium: 2.1 mg/dL (ref 1.7–2.4)

## 2022-05-13 MED ORDER — POTASSIUM CHLORIDE CRYS ER 20 MEQ PO TBCR
40.0000 meq | EXTENDED_RELEASE_TABLET | Freq: Once | ORAL | Status: AC
  Administered 2022-05-13: 40 meq via ORAL
  Filled 2022-05-13: qty 2

## 2022-05-13 NOTE — Discharge Instructions (Addendum)
   Heart Monitor:   Length of Wear: 14 days   Your monitor will be placed prior to discharge  Your physician has recommended that you wear a Zio AT monitor.    This monitor is a medical device that records the heart's electrical activity. Doctors most often use these monitors to diagnose arrhythmias. Arrhythmias are problems with the speed or rhythm of the heartbeat. The monitor is a small device applied to your chest. You can wear one while you do your normal daily activities. While wearing this monitor if you have any symptoms to push the button and record what you felt. Once you have worn this monitor for the period of time provider prescribed (Usually 14 days), you will return the monitor device in the postage paid box. Once it is returned they will download the data collected and provide [place] with a report which the provider will then review and we will call you with those results. Important tips:   1. Avoid showering during the first 24 hours of wearing the monitor. 2. Avoid excessive sweating to help maximize wear time. 3. Do not submerge the device, no hot tubs, and no swimming pools. 4. Keep any lotions or oils away from the patch. 5. After 24 hours you may shower with the patch on. Take brief showers with your back facing the shower head.  6. Do not remove patch once it has been placed because that will interrupt data and decrease adhesive wear time. 7. Push the button when you have any symptoms and write down what you were feeling. 8. Once you have completed wearing your monitor, remove and place into box which has postage paid and place in your outgoing mailbox.  9. If for some reason you have misplaced your box then call our office and we can provide another box and/or mail it off for you.

## 2022-05-13 NOTE — Progress Notes (Signed)
Mobility Specialist: Progress Note   05/13/22 1713  Mobility  Activity Refused mobility   Pt refused mobility stating she recently walked with her NT. Will f/u as able.   [name] Mobility Specialist Secure Chat Only

## 2022-05-13 NOTE — Evaluation (Signed)
SLP Cancellation Note  Patient Details Name: Catherene Kaleta MRN: [id number] DOB: February 28, 1935   Cancelled treatment:       Reason Eval/Treat Not Completed: Other (comment) (pt currently with cardiologist at this time; will continue efforts)   Macario Golds 05/13/2022, 9:18 AM   Kathleen Lime, MS [facility] SLP Acute Rehab Services Office [phone] Pager [phone]

## 2022-05-13 NOTE — Progress Notes (Signed)
Msg sent to Dr. Marlowe Sax Hospitalist about above note

## 2022-05-13 NOTE — Progress Notes (Signed)
Rounding Note    Patient Name: Barbara Mcconnell Date of Encounter: 05/13/2022  [place] Cardiologist: Peter Martinique, MD   Subjective   She is very eager to go home.  Makes it very clear that she wants the least invasive approach possible to her care. No bradyarrhythmias since she has been admitted to the hospital.  Did have a roughly 3-minute episode of SVT with aberrancy yesterday (possible atrial flutter or atrial tachycardia).  This was asymptomatic. Beta-blocker was stopped due to history of sinus pauses seen on monitor in January and the repeated episodes of falls and injuries.  Inpatient Medications    Scheduled Meds:  dabigatran  150 mg Oral Q12H   escitalopram  5 mg Oral Daily   furosemide  10 mg Oral BID   guaiFENesin  600 mg Oral BID   influenza vaccine adjuvanted  0.5 mL Intramuscular Tomorrow-1000   lidocaine  2 patch Transdermal Q24H   lisinopril  1.25 mg Oral Daily   loratadine  10 mg Oral Daily   melatonin  5 mg Oral QHS   montelukast  10 mg Oral QPC supper   rosuvastatin  20 mg Oral Daily   sodium chloride flush  3 mL Intravenous Q12H   tuberculin  5 Units Intradermal Once   Continuous Infusions:  PRN Meds: acetaminophen **OR** acetaminophen, albuterol, docusate sodium, famotidine, ondansetron **OR** ondansetron (ZOFRAN) IV   Vital Signs    Vitals:   05/12/22 1942 05/12/22 2344 05/13/22 0336 05/13/22 0730  BP: 91/82 133/62 (!) 165/74 136/73  Pulse: 93 77 82 81  Resp: (!) 30 (!) 24 19 16   Temp:  98.7 F (37.1 C) 97.7 F (36.5 C) 97.7 F (36.5 C)  TempSrc:  Oral Oral Oral  SpO2: 97% 97% 98% 93%   No intake or output data in the 24 hours ending 05/13/22 0956    05/06/2022    3:36 PM 04/28/2022   10:03 AM 04/12/2022   10:27 AM  Last 3 Weights  Weight (lbs) 117 lb 8 oz 118 lb 119 lb 6.4 oz  Weight (kg) 53.298 kg 53.524 kg 54.159 kg      Telemetry    Mostly normal sinus rhythm with occasional PACs.  Had a brief episode of sustained  SVT with aberrant conduction (the broad QRS persisted for a few beats even after she returned to her normal sinus rhythm)- Personally Reviewed  ECG    No new tracing- Personally Reviewed  Physical Exam  Very lean.  Left periorbital hematoma has now dropped with gravity to her cheek and left jaw.  No active bleeding. GEN: No acute distress.   Neck: No JVD Cardiac: RRR, no murmurs, rubs, or gallops.  Respiratory: Clear to auscultation bilaterally. GI: Soft, nontender, non-distended  MS: No edema; No deformity. Neuro:  Nonfocal  Psych: Normal affect   Labs    High Sensitivity Troponin:  No results for input(s): "TROPONINIHS" in the last 720 hours.   Chemistry Recent Labs  Lab 05/10/22 1532 05/11/22 0958 05/13/22 0358  NA 133* 134* 134*  K 4.1 3.5 3.5  CL 98 100 100  CO2 28 27 25   GLUCOSE 92 158* 103*  BUN 19 12 15   CREATININE 0.96 0.99 1.02*  CALCIUM 10.0 9.1 9.1  MG  --   --  2.1  PROT 7.7 6.1* 5.6*  ALBUMIN 4.3 3.3* 3.1*  AST 53* 50* 38  ALT 30 26 22   ALKPHOS 53 47 41  BILITOT 1.5* 1.7* 1.3*  GFRNONAA 58*  56* 54*  ANIONGAP 7 7 9     Lipids No results for input(s): "CHOL", "TRIG", "HDL", "LABVLDL", "LDLCALC", "CHOLHDL" in the last 168 hours.  Hematology Recent Labs  Lab 05/10/22 1532 05/12/22 0311  WBC 8.0 7.4  RBC 3.73* 2.98*  HGB 10.8* 8.8*  HCT 34.2* 27.0*  MCV 91.7 90.6  MCH 29.0 29.5  MCHC 31.6 32.6  RDW 14.5 14.1  PLT 280 216   Thyroid  Recent Labs  Lab 05/10/22 1532  TSH 0.623    BNPNo results for input(s): "BNP", "PROBNP" in the last 168 hours.  DDimer No results for input(s): "DDIMER" in the last 168 hours.   Radiology    Overnight EEG with video  Result Date: 05/12/2022 Barbara Havens, MD     05/12/2022 12:02 PM Patient Name: Barbara Mcconnell MRN: [id number] Epilepsy Attending: [name] Referring Physician/Provider: Katy Apo, NP Duration: 05/11/2022 2340 to 05/12/2022 1100 Patient history: 87 year old patient with  recent stroke presents with recurrent episodes of imbalance, dizziness and "zoning out", one of which led to a fall. EEG to evaluate for seizure Level of alertness: Awake, asleep AEDs during EEG study: None Technical aspects: This EEG study was done with scalp electrodes positioned according to the 10-20 International system of electrode placement. Electrical activity was reviewed with band pass filter of 1-70Hz , sensitivity of 7 uV/mm, display speed of 35mm/sec with a 60Hz  notched filter applied as appropriate. EEG data were recorded continuously and digitally stored.  Video monitoring was available and reviewed as appropriate. Description: The posterior dominant rhythm consists of 9-10 Hz activity of moderate voltage (25-35 uV) seen predominantly in posterior head regions, symmetric and reactive to eye opening and eye closing. Sleep was characterized by vertex waves, sleep spindles (12 to 14 Hz), maximal frontocentral region.  Hyperventilation and photic stimulation were not performed.   IMPRESSION: This study is within normal limits. No seizures or epileptiform discharges were seen throughout the recording. A normal interictal EEG does not exclude the diagnosis of epilepsy. Barbara Mcconnell   MR CERVICAL SPINE WO CONTRAST  Result Date: 05/11/2022 CLINICAL DATA:  Cervical radiculopathy, no red flags EXAM: MRI CERVICAL SPINE WITHOUT CONTRAST TECHNIQUE: Multiplanar, multisequence MR imaging of the cervical spine was performed. No intravenous contrast was administered. COMPARISON:  CT cervical spine May 10, 2022. FINDINGS: Alignment: Mild anterolisthesis of C2 on C3 and C3 on C4. Vertebrae: Degenerative/discogenic endplate signal changes in the lower cervical spine. No specific evidence of acute fracture or discitis/osteomyelitis. No suspicious bone lesions. Cord: Normal cord signal. Posterior Fossa, vertebral arteries, paraspinal tissues: Visualized vertebral artery flow voids are maintained. No evidence of  acute abnormality in the visualized posterior fossa. Disc levels: C2-C3: Small posterior disc osteophyte complex with right greater than left facet and uncovertebral hypertrophy. Resulting mild to moderate right and mild left foraminal stenosis. Mild canal stenosis. C3-C4: Posterior disc osteophyte complex with bilateral facet and uncovertebral hypertrophy. Resulting severe bilateral foraminal stenosis. Mild to moderate canal stenosis. C4-C5: Posterior disc osteophyte complex with bilateral facet uncovertebral hypertrophy. Resulting moderate bilateral foraminal stenosis. Mild to moderate canal stenosis. C5-C6: Posterior disc osteophyte complex with right greater than left facet and uncovertebral hypertrophy. Resulting severe right foraminal stenosis. Mild canal stenosis. Patent left foramen. C6-C7: Posterior disc osteophyte complex with right greater than left facet and uncovertebral hypertrophy. Resulting moderate right foraminal stenosis. Patent canal and left foramen. C7-T1: No significant disc protrusion, foraminal stenosis, or canal stenosis. IMPRESSION: 1. Severe foraminal stenosis bilaterally at  C3-C4 and on the right at C5-C6. Moderate foraminal stenosis on the right at C6-C7. Milder multilevel foraminal stenosis is detailed above. 2. Mild to moderate canal stenosis at C3-C4 and C4-C5. Mild canal stenosis at C2-C3 and C5-C6. Electronically Signed   By: Margaretha Sheffield M.D.   On: 05/11/2022 15:11   CT CHEST WO CONTRAST  Result Date: 05/11/2022 CLINICAL DATA:  Respiratory illness.  Cough.  Recent fall EXAM: CT CHEST WITHOUT CONTRAST TECHNIQUE: Multidetector CT imaging of the chest was performed following the standard protocol without IV contrast. RADIATION DOSE REDUCTION: This exam was performed according to the departmental dose-optimization program which includes automated exposure control, adjustment of the mA and/or kV according to patient size and/or use of iterative reconstruction technique.  COMPARISON:  X-ray 05/10/2022, CT 07/24/2021 FINDINGS: Cardiovascular: Heart size is upper limits of normal. No pericardial effusion. Postsurgical changes of TAVR. Thoracic aorta is nonaneurysmal. Atherosclerotic vascular calcifications of the aorta and coronary arteries. Central pulmonary vasculature is upper limits of normal in size. Mediastinum/Nodes: No enlarged mediastinal or axillary lymph nodes. Thyroid gland, trachea, and esophagus demonstrate no significant findings. Lungs/Pleura: Chronic bronchiectasis within the right middle lobe and lingula with peribronchovascular nodularity, not significantly changed from prior. New site of tree-in-bud nodularity within the superior segment of the left lower lobe (series 8, image 56). Scattered areas of tree-in-bud nodularity within the bilateral lung apices is unchanged. Left basilar atelectasis. No pleural effusion or pneumothorax. Upper Abdomen: No acute abnormality. Musculoskeletal: Mildly exaggerated thoracic kyphosis. No acute bony or chest wall abnormality. IMPRESSION: 1. New site of tree-in-bud nodularity within the superior segment of the left lower lobe, compatible with an infectious or inflammatory bronchiolitis. 2. Chronic bronchiectasis within the right middle lobe and lingula with peribronchovascular nodularity suggesting sequela of chronic atypical infection such as mycobacteria. 3. Aortic and coronary artery atherosclerosis (ICD10-I70.0). Electronically Signed   By: Davina Poke D.O.   On: 05/11/2022 11:23    Cardiac Studies   Cardiac catheterization December 2022   RIGHT/LEFT HEART CATH AND CORONARY ANGIOGRAPHY    Conclusion       Prox RCA lesion is 50% stenosed.   Prox Cx to Mid Cx lesion is 25% stenosed.   Prox LAD to Mid LAD lesion is 25% stenosed.   Hemodynamic findings consistent with pulmonary hypertension.   Stable, mild to moderate coronary artery disease.  No indication for PCI.   Unable to cross aortic valve, consistent  with worsening noted on echocardiogram.  Continue plans for TAVR w/u.       Echocardiogram 03/26/2022     1. Left ventricular ejection fraction, by estimation, is 65 to 70%. The  left ventricle has normal function. The left ventricle has no regional  wall motion abnormalities. There is moderate left ventricular hypertrophy.  Left ventricular diastolic  parameters are indeterminate.   2. Right ventricular systolic function is normal. The right ventricular  size is normal. Tricuspid regurgitation signal is inadequate for assessing  PA pressure.   3. Left atrial size was moderately dilated.   4. The mitral valve is degenerative. Trivial mitral valve regurgitation.  Mild mitral stenosis. The mean mitral valve gradient is 5.0 mmHg with  average heart rate of 68 bpm. Severe mitral annular calcification.   5. The abnormal color flow signal seen previously below the prosthetic  aortic valve is seen again on this exam, color flow in multiple views  suggestive of valvular regurgitation, though it cannot be demonstrated  with spectral Doppler, which would  support color  flow artifact, possibly from stent frame of valve. No  evidence of prosthetic valve obstruction or significant regurgitation.      The aortic valve has been repaired/replaced. Aortic valve  regurgitation is mild. There is a 23 mm Sapien prosthetic (TAVR) valve  present in the aortic position. Procedure Date: 08/03/21. Echo findings are  consistent with likely stable structure and  function of the aortic valve prosthesis. Aortic valve area, by VTI  measures 2.58 cm. Aortic valve mean gradient measures 11.0 mmHg. Aortic  valve Vmax measures 1.98 m/s. Aortic valve acceleration time measures 85  msec.   6. The inferior vena cava is normal in size with greater than 50%  respiratory variability, suggesting right atrial pressure of 3 mmHg.    Carotid duplex 03/26/2022   Right Carotid: Velocities in the right ICA are consistent  with a 40-59%                 stenosis.   Left Carotid: Velocities in the left ICA are consistent with a 1-39%  stenosis.   Vertebrals:  Bilateral vertebral arteries demonstrate antegrade flow.  Subclavians: Normal flow hemodynamics were seen in bilateral subclavian               arteries.   *See table(s) above for measurements and observations.     Event monitor January 2023   Patch Wear Time:  13 days and 20 hours (2023-01-11T10:54:50-0500 to 2023-01-25T06:56:22-0500)   Patient had a min HR of 33 bpm, max HR of 197 bpm, and avg HR of 84 bpm. Predominant underlying rhythm was Sinus Rhythm.    EVENTS: 18 Supraventricular Tachycardia runs occurred, the run with the fastest interval lasting 5 beats with a max rate of 197 bpm, the longest lasting 9.2 secs with an avg rate of 140 bpm.    Atrial Fibrillation/Flutter occurred (1% burden), ranging from 90-177 bpm (avg of 136 bpm), the longest lasting 3 hours 41 mins with an avg rate of 136 bpm.    5 Pauses occurred, the longest lasting 6.1 secs (during sleep).    Atrial Fibrillation/Flutter was detected within +/- 45 seconds of symptomatic patient event(s). Isolated SVEs were rare (<1.0%), SVE Couplets were rare (<1.0%), and SVE Triplets were rare (<1.0%). Isolated VEs were rare (<1.0%), VE Couplets were rare (<1.0%), and no VE Triplets were present.   No ventricular tachyarrhythmias or clinically significant bradyarrhythmias were detected.   Patient is already on anticoagulation for paroxysmal atrial fibrillation.   Head and neck CT angiography September 2023   CTA neck:   1. The common carotid and internal carotid arteries are patent within the neck. Atherosclerotic plaque, bilaterally. Most notably, there is a severe (estimated 80-90% stenosis) at the origin of the right ICA. 2. Vertebral arteries patent within the neck. Mild atherosclerotic narrowing at the origin of the dominant left vertebral artery. 3.  Aortic  Atherosclerosis (ICD10-I70.0). 4. Bronchiectasis and scattered tree-in-bud pulmonary opacities within the bilateral upper lobes. The tree-in-bud opacities likely reflect sequelae of chronic infection. 5. Cervical spondylosis.   CTA head:   1. Occluded proximal-to-mid M2 left MCA vessel. 2. Additional intracranial atherosclerotic disease, as described. Most notably, there is a severe stenosis within the distal V4 right vertebral artery (the non-dominant vertebral artery).   CT perfusion head:   The perfusion software identifies a 12 mL region of critically hypoperfused parenchyma within the left MCA vascular territory. No core infarct is identified. Reported mismatch volume: 12 mL.     Patient Profile  87 y.o. female with paroxysmal atrial fibrillation and recent L MCA embolic stroke, R internal carotid stenosis, heart failure with preserved ejection fraction, history of TAVR, previous incidentally noted sinus pauses >3", HTN, HLP, admitted after a fall without syncope  Assessment & Plan    Recurrent falls/injury: likely a multifactorial problem.  Previous ambulatory rhythm monitor showed asymptomatic nocturnal pauses (post TAVR, on beta blocker).  She has orthostatic hypotension: will need to tolerate higher systolic blood pressure, up to SBP of 160.  She does have carotid stenosis although severity on most recent ultrasound is not as striking as it was on the CT angiogram from September.  It is possible that she also has some stenoses in the smaller branches in the head, further increasing impacts of episodes of hypotension.  Finally, she clearly has some problems with normal gait and balance as a consequence of her stroke.  May need specialized physical therapy, geared to improved balance. ASCVD: Severe stenosis described by CT angiography in the right internal carotid artery (contralateral to her recent embolic stroke) as well as stenosis of the dominant left vertebral artery.  The  presence of these lesions may increase the impact of episodes of orthostatic hypotension. Recent embolic stroke: This occurred while she was receiving reduced dose Eliquis (appropriate for age and small body size) and she has been switched to Pradaxa.  Despite her falls and injuries, I believe the risk of recurrent embolic stroke still outweighs the risk of injuries and bleeding. Paroxysmal atrial fibrillation: Currently maintaining sinus rhythm.  The burden of arrhythmia appears to be low (it was 1% on a monitor in January).  To date, I do not think we have demonstrated a connection between her tachyarrhythmia and the episodes of confusion or or injuries.  Beta blocker stopped due to sinus pauses reported on past monitor.  During this hospitalization she only had a single episode of rapid atrial rhythm, lasting less than 3 minutes, asymptomatic.  I think it is safer to keep her off medications with negative chronotropic effect until we clarify the cause for her falls. Sinus pauses: Notably, these were recorded incidentally on her post TAVR monitor and occurred only during sleep.  These were not due to AV block, but rather what appears to be sinus node arrest. Have stopped her beta-blocker (Unfortunately, this could lead to issues with RVR during atrial fibrillation).  No overt proof for tachycardia-bradycardia syndrome (the pauses are not postconversion pauses), but ultimately the appropriate treatment may be implantation of a dual-chamber permanent pacemaker.  To date we have not had clear proof of an association between her spells of disorientation or falls and arrhythmia.  She has not had frank syncope.  Unless severe bradyarrhythmia documented during this hospital stay, recommend repeating a 14 day arrhythmia monitor and follow up with EP. S/P TAVR: Normal function of the bioprosthesis. HTN: Her blood pressure is repeatedly described as being very volatile.  Recommend tolerating systolic blood pressure up  to 160-170 mmHg while we clarify the mechanism of her falls.  Now off beta-blocker.  Currently her blood pressure is in the 130-160 range which I think is optimal. HFpEF: Appears euvolemic, clinically compensated HLP: Widespread evidence of atherosclerosis involving the carotid arteries, branches of the abdominal aorta, nonobstructive CAD.  Recommend target LDL cholesterol less than 70.  Currently on rosuvastatin 20 mg daily.   [place] will sign off.   Medication Recommendations:  Stop metoprolol XL 150 mg twice daily Lisinopril 1.25 mg daily Furosemide 10 mg  twice daily Rosuvastatin 20 mg daily Other recommendations (labs, testing, etc):  Outpatient 14-day event monitor, preferably [facility]. Recheck lipid profile.  Target LDL less than 70. Follow up as an outpatient: After completing the event monitor  For questions or updates, please contact [facility] Please consult [email or web address] for contact info under        Signed, Sanda Klein, MD  05/13/2022, 9:56 AM

## 2022-05-13 NOTE — Discharge Summary (Incomplete)
Physician Discharge Summary   Patient: Barbara Mcconnell MRN: [id number] DOB: [birth year]  Admit date:     05/10/2022  Discharge date: 05/14/2022  Discharge Physician: Azucena Fallen   PCP: Frederica Kuster, MD   Recommendations at discharge:  {Tip this will not be part of the note when signed- Example include specific recommendations for outpatient follow-up, pending tests to follow-up on. (Optional):26781}  Follow up with PCP Follow up with cardiology for heart monitor discussion  Discharge Diagnoses: Principal Problem:   Fall Active Problems:   Orthostatic hypotension   Dizziness   Hypertensive urgency   Abnormal CXR   Hyponatremia   PAF (paroxysmal atrial fibrillation) (HCC)   Elevated liver enzymes   History of CVA with residual deficit   (HFpEF) heart failure with preserved ejection fraction (HCC)   Nasal bone fracture   Carotid arterial disease (HCC)   S/P TAVR (transcatheter aortic valve replacement)   Dyslipidemia, goal LDL below 70   Anxiety  Resolved Problems:   * No resolved hospital problems. [facility] Course: Barbara Mcconnell is a 87 y.o. female with medical history significant of history HTN, HLD, CAD, Afib on eliquis, s/p TAVR, takotsubo cardiomyopathy, CVA, anxiety, arthritis, and GERD presents after having a fall.   Recently hospitalized 9/1-9/6 for L MCA infarct with IR intervention due to embolic source despite being on eliquis. Transitioned to Pradaxa previously. Multiple episodes of instability, dizziness and worsening ambulatory dysfunction ultimately culminating in falls with remarkable bruising due to anticoagulation. Concern over recurrent stroke vs seizure at intake - hospitalist called for admission. Neuro consulted for assistance with mental status changes/weakness and cardiology consulted for orthostatic hypotension in the setting of profound cardiac history.    Assessment and Plan: Ambulatory dysfunction, multifactorial Fall with traumatic  injury to left ala of nasal bone - Acute CVA ruled out, EEG negative for any seizures - Patient has notable history of TAVR with transient episodes of pauses on previous outpatient telemetry while on beta-blockers - Carotid stenosis history - Orthostatic hypotension improving - Previous stroke likely playing some small role in her symptoms - See below for further details on these items separately   Hypertensive urgency Bradycardia/Sinus pause/Paroxysmal afib Status post TAVR Orthostatic hypotension - Allow permissive HTN while supine/seated due to profound symptoms upon standing. - Cardiology following, discontinue beta-blockers in the setting of bradycardia and sinus pauses previously noted; sideline EP team to evaluate - Continue low-dose furosemide, lisinopril, rosuvastatin -Monitor for 14 days (or longer if necessary) per cards   Recent prior CVA, without evidence of acute CVA  - MRI without new findings -evolution of prior CVA as expected - Continue PT OT speech - at discharge per recs   ASCVD CAD - Previously noted severe internal carotid artery stenosis, repeat imaging here indicates less severe disease. - Defer to vascular surgery on whether not patient is still appropriate for intervention, given her episodes as above recent CVA, A-fib with failure of Eliquis as well as orthostatic hypotension which may be related to stenosis she certainly has indications for intervention. - On Pradaxa and statin   Abnormal chest imaging, POA -Incidentally noted new left lower lobe inflammation noted on chronic bronchiectasis of RML Given atypical findings questionably consistent with mycobacterial infection TB test is pending -check TB skin testing 05/14/2022 -Procalcitonin negative    Hyponatremia, hypovolemic -Likely secondary to poor p.o. intake and diuretics   Paroxysmal atrial fibrillation on chronic anticoagulation Patient had just recently had left MCA stroke last month and was  switched from Eliquis to Pradaxa   Elevated liver enzymes Chronic. Hepatitis A IgM positive   History of CVA with residual deficit Patient suffered left MCA infarct with left M2 occlusion status post interventional radiology intervention with reconstitution of left M3, embolic secondary to A-fib despite being on Eliquis in September.  Patient switched to Pradaxa. -Continue statin and Pradaxa   Heart failure with preserved EF, not in acute exacerbation EF 65-70%   Nasal bone fracture secondary to fall Patient found to have a slightly displaced fracture of the left ala of nasal bones. -Patient likely can follow-up in the outpatient setting with ENT if needed   Dyslipidemia Home medication regimen includes Crestor 20 mg daily. -Continue Crestor   Anxiety -Continue Lexapro   {Tip this will not be part of the note when signed There is no height or weight on file to calculate BMI. , ,  (Optional):26781}  {(NOTE) Pain control PDMP Statment (Optional):26782} Consultants: Cardiology, Neuro Procedures performed: EEG  Disposition: Assisted living Diet recommendation:  Regular diet DISCHARGE MEDICATION: Allergies as of 05/13/2022       Reactions   Codeine Nausea And Vomiting   Cyclobenzaprine Other (See Comments)   Unknown reaction   Iodinated Contrast Media Nausea Only   Prednisone Other (See Comments)   "Sores in the mouth"        Medication List     STOP taking these medications    metoprolol tartrate 25 MG tablet Commonly known as: LOPRESSOR       TAKE these medications    acetaminophen 500 MG tablet Commonly known as: TYLENOL Take 500-1,000 mg by mouth every 6 (six) hours as needed for moderate pain.   albuterol 108 (90 Base) MCG/ACT inhaler Commonly known as: VENTOLIN HFA Inhale 2 puffs into the lungs every 4 (four) hours as needed for wheezing or shortness of breath (seasonal allergies).   ASPERCREME EX Apply 1 application topically daily as needed (knee  pain).   dabigatran 150 MG Caps capsule Commonly known as: PRADAXA Take 1 capsule (150 mg total) by mouth every 12 (twelve) hours.   docusate sodium 100 MG capsule Commonly known as: COLACE Take 100 mg by mouth 2 (two) times daily.   EMERGEN-C VITAMIN C PO Take 1 packet by mouth daily. Mix with water   escitalopram 5 MG tablet Commonly known as: Lexapro Take 1 tablet (5 mg total) by mouth daily. What changed: how much to take   famotidine 10 MG tablet Commonly known as: PEPCID Take 10 mg by mouth daily as needed for heartburn.   fexofenadine 60 MG tablet Commonly known as: ALLEGRA Take 60 mg by mouth daily.   furosemide 20 MG tablet Commonly known as: LASIX Take 0.5 tablets (10 mg total) by mouth 2 (two) times daily.   lisinopril 5 MG tablet Commonly known as: ZESTRIL Take 1.25 mg by mouth daily.   melatonin 5 MG Tabs Take 5 mg by mouth at bedtime.   montelukast 10 MG tablet Commonly known as: SINGULAIR Take 1 tablet (10 mg total) by mouth daily after supper.   multivitamin with minerals Tabs tablet Take 1 tablet by mouth daily. Centrum 50 plus   rosuvastatin 20 MG tablet Commonly known as: CRESTOR Take 1 tablet (20 mg total) by mouth daily.   VITAMIN D3 PO Take 1,000 Units by mouth daily.        Follow-up Information     [place], Peter M, MD Follow up on 06/03/2022.   Specialty: Cardiology Why: at 4:00  PM Contact information: [address] [place] [address] [phone]                Discharge Exam: There were no vitals filed for this visit. ***  Condition at discharge: stable  The results of significant diagnostics from this hospitalization (including imaging, microbiology, ancillary and laboratory) are listed below for reference.   Imaging Studies: Overnight EEG with video  Result Date: 05/12/2022 Charlsie Quest, MD     05/12/2022 12:02 PM Patient Name: Barbara Mcconnell MRN: [id number] Epilepsy Attending: Charlsie Quest Referring Physician/Provider: Marjorie Smolder, NP Duration: 05/11/2022 2340 to 05/12/2022 1100 Patient history: 87 year old patient with recent stroke presents with recurrent episodes of imbalance, dizziness and "zoning out", one of which led to a fall. EEG to evaluate for seizure Level of alertness: Awake, asleep AEDs during EEG study: None Technical aspects: This EEG study was done with scalp electrodes positioned according to the 10-20 International system of electrode placement. Electrical activity was reviewed with band pass filter of 1-70Hz , sensitivity of 7 uV/mm, display speed of 55mm/sec with a 60Hz  notched filter applied as appropriate. EEG data were recorded continuously and digitally stored.  Video monitoring was available and reviewed as appropriate. Description: The posterior dominant rhythm consists of 9-10 Hz activity of moderate voltage (25-35 uV) seen predominantly in posterior head regions, symmetric and reactive to eye opening and eye closing. Sleep was characterized by vertex waves, sleep spindles (12 to 14 Hz), maximal frontocentral region.  Hyperventilation and photic stimulation were not performed.   IMPRESSION: This study is within normal limits. No seizures or epileptiform discharges were seen throughout the recording. A normal interictal EEG does not exclude the diagnosis of epilepsy.   MR CERVICAL SPINE WO CONTRAST  Result Date: 05/11/2022 CLINICAL DATA:  Cervical radiculopathy, no red flags EXAM: MRI CERVICAL SPINE WITHOUT CONTRAST TECHNIQUE: Multiplanar, multisequence MR imaging of the cervical spine was performed. No intravenous contrast was administered. COMPARISON:  CT cervical spine May 10, 2022. FINDINGS: Alignment: Mild anterolisthesis of C2 on C3 and C3 on C4. Vertebrae: Degenerative/discogenic endplate signal changes in the lower cervical spine. No specific evidence of acute fracture or discitis/osteomyelitis. No suspicious bone lesions.  Cord: Normal cord signal. Posterior Fossa, vertebral arteries, paraspinal tissues: Visualized vertebral artery flow voids are maintained. No evidence of acute abnormality in the visualized posterior fossa. Disc levels: C2-C3: Small posterior disc osteophyte complex with right greater than left facet and uncovertebral hypertrophy. Resulting mild to moderate right and mild left foraminal stenosis. Mild canal stenosis. C3-C4: Posterior disc osteophyte complex with bilateral facet and uncovertebral hypertrophy. Resulting severe bilateral foraminal stenosis. Mild to moderate canal stenosis. C4-C5: Posterior disc osteophyte complex with bilateral facet uncovertebral hypertrophy. Resulting moderate bilateral foraminal stenosis. Mild to moderate canal stenosis. C5-C6: Posterior disc osteophyte complex with right greater than left facet and uncovertebral hypertrophy. Resulting severe right foraminal stenosis. Mild canal stenosis. Patent left foramen. C6-C7: Posterior disc osteophyte complex with right greater than left facet and uncovertebral hypertrophy. Resulting moderate right foraminal stenosis. Patent canal and left foramen. C7-T1: No significant disc protrusion, foraminal stenosis, or canal stenosis. IMPRESSION: 1. Severe foraminal stenosis bilaterally at C3-C4 and on the right at C5-C6. Moderate foraminal stenosis on the right at C6-C7. Milder multilevel foraminal stenosis is detailed above. 2. Mild to moderate canal stenosis at C3-C4 and C4-C5. Mild canal stenosis at C2-C3 and C5-C6. Electronically Signed   By: May 12, 2022.D.  On: 05/11/2022 15:11   CT CHEST WO CONTRAST  Result Date: 05/11/2022 CLINICAL DATA:  Respiratory illness.  Cough.  Recent fall EXAM: CT CHEST WITHOUT CONTRAST TECHNIQUE: Multidetector CT imaging of the chest was performed following the standard protocol without IV contrast. RADIATION DOSE REDUCTION: This exam was performed according to the departmental dose-optimization program  which includes automated exposure control, adjustment of the mA and/or kV according to patient size and/or use of iterative reconstruction technique. COMPARISON:  X-ray 05/10/2022, CT 07/24/2021 FINDINGS: Cardiovascular: Heart size is upper limits of normal. No pericardial effusion. Postsurgical changes of TAVR. Thoracic aorta is nonaneurysmal. Atherosclerotic vascular calcifications of the aorta and coronary arteries. Central pulmonary vasculature is upper limits of normal in size. Mediastinum/Nodes: No enlarged mediastinal or axillary lymph nodes. Thyroid gland, trachea, and esophagus demonstrate no significant findings. Lungs/Pleura: Chronic bronchiectasis within the right middle lobe and lingula with peribronchovascular nodularity, not significantly changed from prior. New site of tree-in-bud nodularity within the superior segment of the left lower lobe (series 8, image 56). Scattered areas of tree-in-bud nodularity within the bilateral lung apices is unchanged. Left basilar atelectasis. No pleural effusion or pneumothorax. Upper Abdomen: No acute abnormality. Musculoskeletal: Mildly exaggerated thoracic kyphosis. No acute bony or chest wall abnormality. IMPRESSION: 1. New site of tree-in-bud nodularity within the superior segment of the left lower lobe, compatible with an infectious or inflammatory bronchiolitis. 2. Chronic bronchiectasis within the right middle lobe and lingula with peribronchovascular nodularity suggesting sequela of chronic atypical infection such as mycobacteria. 3. Aortic and coronary artery atherosclerosis (ICD10-I70.0). Electronically Signed   By: Duanne Guess D.O.   On: 05/11/2022 11:23   MR BRAIN WO CONTRAST  Result Date: 05/11/2022 CLINICAL DATA:  Stroke follow-up EXAM: MRI HEAD WITHOUT CONTRAST TECHNIQUE: Multiplanar, multiecho pulse sequences of the brain and surrounding structures were obtained without intravenous contrast. COMPARISON:  MRI Brain 03/26/22, CT head 05/10/22  FINDINGS: Brain: Redemonstrated subacute infarct in the posterior left frontal lobe with persistent hyperintense signal diffusion-weighted imaging and decreased signal on the ADC map. Compared to prior exam there is increased volume loss in this region. Additional scattered punctate areas of infarction seen on prior MRI are not visualized on this exam. There is a small punctate focus of microhemorrhage in the anterior left temporal lobe. There is no evidence of associated with the area of infarction. There is sequela mild overall chronic microvascular ischemic change. Chronic bilateral cerebellar infarcts. Unchanged size and shape of the ventricular system. Vascular: Normal flow voids. Skull and upper cervical spine: No suspicious osseous lesions are visualized. There is stepwise anterolisthesis of C2 on C3, C3 on C4, and C4 on C5. Sinuses/Orbits: Bilateral lens replacement. Other: There is a small left frontal scalp hematoma measuring up to 1.6 cm. IMPRESSION: 1. No acute intracranial abnormality 2. Evolving left MCA territory infarct with increased volume loss. No new infarct or hemorrhage. 3. Small left frontal scalp hematoma measuring up to 1.6 cm. Electronically Signed   By: Lorenza Cambridge M.D.   On: 05/11/2022 09:30   CT MAXILLOFACIAL WO CONTRAST  Result Date: 05/10/2022 CLINICAL DATA:  Trauma, fall EXAM: CT MAXILLOFACIAL WITHOUT CONTRAST TECHNIQUE: Multidetector CT imaging of the maxillofacial structures was performed. Multiplanar CT image reconstructions were also generated. RADIATION DOSE REDUCTION: This exam was performed according to the departmental dose-optimization program which includes automated exposure control, adjustment of the mA and/or kV according to patient size and/or use of iterative reconstruction technique. COMPARISON:  None Available. FINDINGS: Osseous: Fracture is seen in the left  ala of nasal bones with 1 mm offset in alignment. No other fractures are seen. Degenerative changes are  noted in left TMJ. Orbits: Optic globes appear symmetrical. Retrobulbar soft tissues are unremarkable. There is no demonstrable blowout fracture in the orbits. Sinuses: There are no air-fluid levels or significant mucosal thickening. Soft tissues: There is large subcutaneous hematoma in the left periorbital region extending to the left frontal scalp. Limited intracranial: Unremarkable. IMPRESSION: There is slightly displaced fracture in the left ala of nasal bones. No other fractures are seen in the facial bones. Significant degenerative changes are noted in left TMJ. Optic globes and retrobulbar soft tissues are unremarkable. There are no air-fluid levels or significant mucosal thickening in paranasal sinuses. Electronically Signed   By: Ernie Avena M.D.   On: 05/10/2022 16:49   CT Cervical Spine Wo Contrast  Result Date: 05/10/2022 CLINICAL DATA:  Trauma, fall EXAM: CT CERVICAL SPINE WITHOUT CONTRAST TECHNIQUE: Multidetector CT imaging of the cervical spine was performed without intravenous contrast. Multiplanar CT image reconstructions were also generated. RADIATION DOSE REDUCTION: This exam was performed according to the departmental dose-optimization program which includes automated exposure control, adjustment of the mA and/or kV according to patient size and/or use of iterative reconstruction technique. COMPARISON:  Trauma, fall FINDINGS: Alignment: There is mild anterolisthesis at C2-C3 and C3-C4 levels, possibly suggesting previous ligament injury and facet degeneration. There is mild dextroscoliosis. Skull base and vertebrae: No recent fracture is seen. Degenerative changes are noted. Soft tissues and spinal canal: There is extrinsic pressure over the ventral margin of thecal sac caused by posterior bony spurs and bulging of annulus at multiple levels. Disc levels: There is encroachment of neural foramina by bony spurs and facet hypertrophy from C2 to C7 levels. Upper chest: Pleural thickening  is seen in the apices. Few blebs are noted. There is peribronchial thickening, possibly suggesting bronchitis. Other: There is inhomogeneous attenuation in thyroid. IMPRESSION: No recent fracture is seen in cervical spine. Cervical spondylosis with encroachment of neural foramina from C2-C7 levels. Electronically Signed   By: Ernie Avena M.D.   On: 05/10/2022 16:42   CT Head Wo Contrast  Result Date: 05/10/2022 CLINICAL DATA:  Trauma, fall EXAM: CT HEAD WITHOUT CONTRAST TECHNIQUE: Contiguous axial images were obtained from the base of the skull through the vertex without intravenous contrast. RADIATION DOSE REDUCTION: This exam was performed according to the departmental dose-optimization program which includes automated exposure control, adjustment of the mA and/or kV according to patient size and/or use of iterative reconstruction technique. COMPARISON:  Previous CT done on 03/25/2022 and MR brain done on 03/26/2022 FINDINGS: Brain: There are no signs of intracranial bleeding. There is a 3 cm area of low attenuation corresponding to the acute infarct seen in the left parietal lobe in the previous MRI. No other new focal abnormalities are noted. Cortical sulci are prominent. Vascular: Unremarkable. Skull: There is subcutaneous hematoma in the left frontal scalp and left periorbital region. Sinuses/Orbits: There are no air-fluid levels in the paranasal sinuses. Other: None. IMPRESSION: No acute intracranial findings are seen. 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 Atrophy. There is left frontal scalp hematoma. No fracture is seen in calvarium. Electronically Signed   By: Ernie Avena M.D.   On: 05/10/2022 16:36   DG Chest 2 View  Result Date: 05/10/2022 CLINICAL DATA:  Fall, altered mental status EXAM: CHEST - 2 VIEW COMPARISON:  Radiograph 111 20,023, CT chest 07/24/2021 FINDINGS: Normal cardiac silhouette. No pneumothorax. No rib  fracture identified. Increased  nodular density in the RIGHT mid lung compared to prior radiograph. Interval reduction in RIGHT pleural effusion and LEFT pleural effusion. IMPRESSION: 1. Increased nodular density in the RIGHT mid lung could represent infection, neoplasm, or increased pleural thickening seen on comparison CT. 2. Improvement bilateral pleural effusions. 3. No evidence of thoracic trauma. Electronically Signed   By: Genevive Bi M.D.   On: 05/10/2022 16:02   VAS US CAROTID  Result Date: 05/06/2022 Carotid Arterial Duplex Study Patient Name:  JULYSSA KYER  Date of Exam:   05/06/2022 Medical Rec #: [id number]     Accession #:    [id number] Date of Birth: [birth year]      Patient Gender: F Patient Age:   9 years Exam Location:  [facility] Procedure:      VAS US CAROTID Referring Phys: Ivin Booty ROBINS --------------------------------------------------------------------------------  Indications:       Carotid artery disease. Risk Factors:      Hypertension, hyperlipidemia, coronary artery disease. Other Factors:     Stroke on 03/25/22. Comparison Study:  09/03/21: Right: 40-59% 226/54 cm/s Left 1-39% Performing Technologist: Thereasa Parkin RVT  Examination Guidelines: A complete evaluation includes B-mode imaging, spectral Doppler, color Doppler, and power Doppler as needed of all accessible portions of each vessel. Bilateral testing is considered an integral part of a complete examination. Limited examinations for reoccurring indications may be performed as noted.  Right Carotid Findings: +----------+--------+--------+--------+-------------------------+--------+           PSV cm/sEDV cm/sStenosisPlaque Description       Comments +----------+--------+--------+--------+-------------------------+--------+ CCA Prox  66      16                                                +----------+--------+--------+--------+-------------------------+--------+ CCA Mid   92      13                                                 +----------+--------+--------+--------+-------------------------+--------+ CCA Distal77      14                                                +----------+--------+--------+--------+-------------------------+--------+ ICA Prox  260     55      40-59%  heterogenous and calcific         +----------+--------+--------+--------+-------------------------+--------+ ICA Mid   150     24                                                +----------+--------+--------+--------+-------------------------+--------+ ICA Distal81      17                                                +----------+--------+--------+--------+-------------------------+--------+ ECA       97      0  heterogenous and calcific         +----------+--------+--------+--------+-------------------------+--------+ +----------+--------+-------+----------------+-------------------+           PSV cm/sEDV cmsDescribe        Arm Pressure (mmHG) +----------+--------+-------+----------------+-------------------+ [id number]            Multiphasic, WNL                    +----------+--------+-------+----------------+-------------------+ +---------+--------+--+--------+-+---------+ VertebralPSV cm/s30EDV cm/s3Antegrade +---------+--------+--+--------+-+---------+  Left Carotid Findings: +----------+--------+--------+--------+----------------------+--------+           PSV cm/sEDV cm/sStenosisPlaque Description    Comments +----------+--------+--------+--------+----------------------+--------+ CCA Prox  69      11              focal and heterogenous         +----------+--------+--------+--------+----------------------+--------+ CCA Mid   90      14              focal and heterogenous         +----------+--------+--------+--------+----------------------+--------+ CCA Distal96      20                                              +----------+--------+--------+--------+----------------------+--------+ ICA Prox  87      17      1-39%   heterogenous                   +----------+--------+--------+--------+----------------------+--------+ ICA Mid   106     22                                             +----------+--------+--------+--------+----------------------+--------+ ICA Distal114     26                                             +----------+--------+--------+--------+----------------------+--------+ ECA       66      0               heterogenous                   +----------+--------+--------+--------+----------------------+--------+ +----------+--------+--------+----------------+-------------------+           PSV cm/sEDV cm/sDescribe        Arm Pressure (mmHG) +----------+--------+--------+----------------+-------------------+ [id number]             Multiphasic, WNL                    +----------+--------+--------+----------------+-------------------+ +---------+--------+--+--------+-+---------+ VertebralPSV cm/s44EDV cm/s7Antegrade +---------+--------+--+--------+-+---------+   Summary: Right Carotid: Velocities in the right ICA are consistent with a 40-59%                stenosis. Left Carotid: Velocities in the left ICA are consistent with a 1-39% stenosis. Vertebrals:  Bilateral vertebral arteries demonstrate antegrade flow. Subclavians: Normal flow hemodynamics were seen in bilateral subclavian              arteries. *See table(s) above for measurements and observations.  Electronically signed by Gerarda Fraction on 05/06/2022 at 5:01:31 PM.    Final     Microbiology: Results for orders placed or performed during the hospital encounter of 03/25/22  Resp Panel  by RT-PCR (Flu A&B, Covid) Anterior Nasal Swab     Status: None   Collection Time: 03/25/22 10:57 AM   Specimen: Anterior Nasal Swab  Result Value Ref Range Status   SARS Coronavirus 2 by RT PCR NEGATIVE NEGATIVE Final     Comment: (NOTE) SARS-CoV-2 target nucleic acids are NOT DETECTED.  The SARS-CoV-2 RNA is generally detectable in upper respiratory specimens during the acute phase of infection. The lowest concentration of SARS-CoV-2 viral copies this assay can detect is 138 copies/mL. A negative result does not preclude SARS-Cov-2 infection and should not be used as the sole basis for treatment or other patient management decisions. A negative result may occur with  improper specimen collection/handling, submission of specimen other than nasopharyngeal swab, presence of viral mutation(s) within the areas targeted by this assay, and inadequate number of viral copies(<138 copies/mL). A negative result must be combined with clinical observations, patient history, and epidemiological information. The expected result is Negative.  Fact Sheet for Patients:  BloggerCourse.comhttps://www.fda.gov/media/152166/download  Fact Sheet for Healthcare Providers:  SeriousBroker.ithttps://www.fda.gov/media/152162/download  This test is no t yet approved or cleared by the [place] FDA and  has been authorized for detection and/or diagnosis of SARS-CoV-2 by FDA under an Emergency Use Authorization (EUA). This EUA will remain  in effect (meaning this test can be used) for the duration of the COVID-19 declaration under Section 564(b)(1) of the Act, 21 U.S.C.section 360bbb-3(b)(1), unless the authorization is terminated  or revoked sooner.       Influenza A by PCR NEGATIVE NEGATIVE Final   Influenza B by PCR NEGATIVE NEGATIVE Final    Comment: (NOTE) The Xpert Xpress SARS-CoV-2/FLU/RSV plus assay is intended as an aid in the diagnosis of influenza from Nasopharyngeal swab specimens and should not be used as a sole basis for treatment. Nasal washings and aspirates are unacceptable for Xpert Xpress SARS-CoV-2/FLU/RSV testing.  Fact Sheet for Patients: BloggerCourse.comhttps://www.fda.gov/media/152166/download  Fact Sheet for Healthcare  Providers: SeriousBroker.ithttps://www.fda.gov/media/152162/download  This test is not yet approved or cleared by the [place] FDA and has been authorized for detection and/or diagnosis of SARS-CoV-2 by FDA under an Emergency Use Authorization (EUA). This EUA will remain in effect (meaning this test can be used) for the duration of the COVID-19 declaration under Section 564(b)(1) of the Act, 21 U.S.C. section 360bbb-3(b)(1), unless the authorization is terminated or revoked.  Performed at [facility], [address] [place], [place] [address]   MRSA Next Gen by PCR, Nasal     Status: None   Collection Time: 03/25/22  4:53 PM   Specimen: Nasal Mucosa; Nasal Swab  Result Value Ref Range Status   MRSA by PCR Next Gen NOT DETECTED NOT DETECTED Final    Comment: (NOTE) The GeneXpert MRSA Assay (FDA approved for NASAL specimens only), is one component of a comprehensive MRSA colonization surveillance program. It is not intended to diagnose MRSA infection nor to guide or monitor treatment for MRSA infections. Test performance is not FDA approved in patients less than 85 years old. Performed at [facility], [address] [place], [place] [address]     Labs: CBC: Recent Labs  Lab 05/10/22 1532 05/12/22 0311  WBC 8.0 7.4  NEUTROABS 5.7  --   HGB 10.8* 8.8*  HCT 34.2* 27.0*  MCV 91.7 90.6  PLT 280 216   Basic Metabolic Panel: Recent Labs  Lab 05/10/22 1532 05/11/22 0958 05/13/22 0358  NA 133* 134* 134*  K 4.1 3.5 3.5  CL  98 100 100  CO2 28 27 25   GLUCOSE 92 158* 103*  BUN 19 12 15   CREATININE 0.96 0.99 1.02*  CALCIUM 10.0 9.1 9.1  MG  --   --  2.1   Liver Function Tests: Recent Labs  Lab 05/10/22 1532 05/11/22 0958 05/13/22 0358  AST 53* 50* 38  ALT 30 26 22   ALKPHOS 53 47 41  BILITOT 1.5* 1.7* 1.3*  PROT 7.7 6.1* 5.6*  ALBUMIN 4.3 3.3* 3.1*   CBG: Recent Labs  Lab 05/10/22 1515  GLUCAP 80    Discharge time spent: greater than 30  minutes.  Signed: Little Ishikawa, MD [facility] 05/13/2022

## 2022-05-13 NOTE — NC FL2 (Signed)
[place] LEVEL OF CARE SCREENING TOOL     IDENTIFICATION  Patient Name: Barbara Mcconnell Birthdate: 05-19-1935 Sex: female Admission Date (Current Location): 05/10/2022  [facility] and [place] Number:  Herbalist and Address:  [facility] [place], [place] [address], [place], [place] [address]      Provider Number: [id number]  Attending Physician Name and Address:  Little Ishikawa, MD  Relative Name and Phone Number:       Current Level of Care: Hospital Recommended Level of Care: [place] Prior Approval Number:    Date Approved/Denied:   PASRR Number:    Discharge Plan: Other (Comment) (ALF)    Current Diagnoses: Patient Active Problem List   Diagnosis Date Noted   Orthostatic hypotension 05/11/2022   Dizziness 05/11/2022   Abnormal CXR 05/11/2022   Hypertensive urgency 05/11/2022   Elevated liver enzymes 05/11/2022   History of CVA with residual deficit 05/11/2022   (HFpEF) heart failure with preserved ejection fraction ([place]) 05/11/2022   Nasal bone fracture 05/11/2022   Spells of decreased attentiveness    Fall 05/10/2022   Stroke ([place]) 03/25/2022   Urinary tract bacterial infections 02/15/2022   Anxiety    Physical deconditioning 08/04/2021   PAF (paroxysmal atrial fibrillation) ([place]) 08/03/2021   S/P TAVR (transcatheter aortic valve replacement) 08/03/2021   Hyponatremia 07/30/2021   Fluid overload 07/22/2021   Bronchiectasis with acute exacerbation ([place]) 07/22/2021   Other cirrhosis of liver ([place]) 07/22/2021   Pericardial effusion 07/21/2021   Hypertension    Severe aortic stenosis 05/31/2019   Renal insufficiency 05/31/2019   CAD (coronary artery disease) 05/31/2019   Dyslipidemia, goal LDL below 70 05/13/2019   Carotid arterial disease ([place]) 05/13/2019   Takotsubo cardiomyopathy     Orientation RESPIRATION BLADDER Height & Weight     Self, Time, Situation, Place  Normal Continent Weight:    Height:     BEHAVIORAL SYMPTOMS/MOOD NEUROLOGICAL BOWEL NUTRITION STATUS      Continent Diet (no salt low carb diet)  AMBULATORY STATUS COMMUNICATION OF NEEDS Skin   Limited Assist Verbally Normal                       Personal Care Assistance Level of Assistance  Bathing, Feeding, Dressing Bathing Assistance: Limited assistance Feeding assistance: Independent Dressing Assistance: Limited assistance     Functional Limitations Info             SPECIAL CARE FACTORS FREQUENCY  PT (By licensed PT), OT (By licensed OT), Speech therapy     PT Frequency: 3x/wk with home health OT Frequency: 3x/wk with home health     Speech Therapy Frequency: 3x/wk with home health      Contractures Contractures Info: Not present    Additional Factors Info  Code Status, Allergies Code Status Info: DNR Allergies Info: Codeine, Cyclobenzaprine, Iodinated Contrast Media, Prednisone              Discharge Medications: STOP taking these medications     metoprolol tartrate 25 MG tablet Commonly known as: LOPRESSOR           TAKE these medications     acetaminophen 500 MG tablet Commonly known as: TYLENOL Take 500-1,000 mg by mouth every 6 (six) hours as needed for moderate pain.    albuterol 108 (90 Base) MCG/ACT inhaler Commonly known as: VENTOLIN HFA Inhale 2 puffs into the lungs every 4 (four) hours as needed for wheezing or shortness  of breath (seasonal allergies).    ASPERCREME EX Apply 1 application topically daily as needed (knee pain).    dabigatran 150 MG Caps capsule Commonly known as: PRADAXA Take 1 capsule (150 mg total) by mouth every 12 (twelve) hours.    docusate sodium 100 MG capsule Commonly known as: COLACE Take 100 mg by mouth 2 (two) times daily.    EMERGEN-C VITAMIN C PO Take 1 packet by mouth daily. Mix with water    escitalopram 5 MG tablet Commonly known as: Lexapro Take 1 tablet (5 mg total) by mouth daily. What changed: how much to  take    famotidine 10 MG tablet Commonly known as: PEPCID Take 10 mg by mouth daily as needed for heartburn.    fexofenadine 60 MG tablet Commonly known as: ALLEGRA Take 60 mg by mouth daily.    furosemide 20 MG tablet Commonly known as: LASIX Take 0.5 tablets (10 mg total) by mouth 2 (two) times daily.    lisinopril 5 MG tablet Commonly known as: ZESTRIL Take 1.25 mg by mouth daily.    melatonin 5 MG Tabs Take 5 mg by mouth at bedtime.    montelukast 10 MG tablet Commonly known as: SINGULAIR Take 1 tablet (10 mg total) by mouth daily after supper.    multivitamin with minerals Tabs tablet Take 1 tablet by mouth daily. Centrum 50 plus    rosuvastatin 20 MG tablet Commonly known as: CRESTOR Take 1 tablet (20 mg total) by mouth daily.    VITAMIN D3 PO Take 1,000 Units by mouth daily.    Relevant Imaging Results:  Relevant Lab Results:   Additional Information SS#: [id number]  Baldemar Lenis, LCSW

## 2022-05-13 NOTE — Progress Notes (Signed)
Tele Tech called and stated pt had a 14 beat run of nonsustained VT. Pt denied any pain, discomfort, or SOB.

## 2022-05-13 NOTE — Progress Notes (Signed)
Mobility Specialist: Progress Note   05/13/22 1134  Mobility  Activity Ambulated with assistance in hallway  Level of Assistance Contact guard assist, steadying assist  Assistive Device Front wheel walker  Distance Ambulated (ft) 330 ft  Activity Response Tolerated well  Mobility Referral Yes  $Mobility charge 1 Mobility   Pre-Mobility: 88 HR, 95% SpO2 During Mobility: 124 HR Post-Mobility: 95 HR, 143/68 (90) BP, 97% SpO2  Pt received in the bed and agreeable to mobility. Requesting to use BR first, then hallway ambulation. Pt impulsive with RW management during session requiring verbal cues to correct. No c/o throughout. Pt back to bed after session with call bell and phone in reach.   [place] Delmer Kowalski Mobility Specialist Secure Chat Only

## 2022-05-13 NOTE — Progress Notes (Signed)
Neurology Progress Note  Brief HPI: Patient with history of PAF on Pradaxa, recent left MCA stroke, CAD, HTN, HLD, ICA stenosis, Takotsubo cardiomyopathy and TAVR presents with recurrent episodes of dizziness and imbalance which often occur when she is standing up.  She has also had spells of "zoning out" which have raised concern for seizures.  MRI brain was negative for acute stroke.  Subjective: Patient is sitting in bed, awake and alert in NAD. Daughter is att he bedside. No new neurological events overnight. LTM was discontinued yesterday with no seizures identified.   Exam: Vitals:   05/13/22 0730 05/13/22 1209  BP: 136/73   Pulse: 81 85  Resp: 16 18  Temp: 97.7 F (36.5 C) (!) 97.5 F (36.4 C)  SpO2: 93% 96%   Gen: In bed, NAD Resp: non-labored breathing, no acute distress Abd: soft, nt HEENT: Bruising on left side of face   Neuro: Mental Status: Alert and oriented x3, self, age and place, expressive aphasia, follows commands Cranial Nerves:PERRL, EOMI, facial sensation symmetrical, face symmetrical,hearing intact to voice, phonation normal, shoulder shrug symmetrical and tongue midline Motor: Normal and symmetrical antigravity strength in bilateral arms and legs Sensory:intact to light touch throughout and symmetrical Gait: Deferred  Pertinent Labs:    Latest Ref Rng & Units 05/12/2022    3:11 AM 05/10/2022    3:32 PM 03/30/2022    5:01 AM  CBC  WBC 4.0 - 10.5 K/uL 7.4  8.0  6.9   Hemoglobin 12.0 - 15.0 g/dL 8.8  10.8  10.1   Hematocrit 36.0 - 46.0 % 27.0  34.2  31.1   Platelets 150 - 400 K/uL 216  280  265        Latest Ref Rng & Units 05/13/2022    3:58 AM 05/11/2022    9:58 AM 05/10/2022    3:32 PM  BMP  Glucose 70 - 99 mg/dL 103  158  92   BUN 8 - 23 mg/dL 15  12  19    Creatinine 0.44 - 1.00 mg/dL 1.02  0.99  0.96   Sodium 135 - 145 mmol/L 134  134  133   Potassium 3.5 - 5.1 mmol/L 3.5  3.5  4.1   Chloride 98 - 111 mmol/L 100  100  98   CO2 22 - 32  mmol/L 25  27  28    Calcium 8.9 - 10.3 mg/dL 9.1  9.1  10.0      Imaging Reviewed:  CT head:  Old left MCA stroke, left scalp hematoma  MRI brain: No acute abnormality, evolving left MCA infarct, left frontal scalp hematoma  LTM EEG: This study is within normal limits. No seizures or epileptiform discharges were seen throughout the recording.  Orthostatic VS for the past 24 hrs:  BP- Lying Pulse- Lying BP- Sitting Pulse- Sitting BP- Standing at 0 minutes Pulse- Standing at 0 minutes  05/13/22 1209 147/80 85 151/78 88 118/76 107  05/12/22 1733 147/69 87 156/78 96 125/71 --     Assessment: 87 year old patient with recent stroke presents with recurrent episodes of imbalance, dizziness and "zoning out", one of which led to a fall.  MRI was negative for acute stroke.  Episodes may be presyncopal in nature, possibly related to orthostatic hypotension.  Patient's daughter states that her blood pressure has been widely variable over the last month, making this more likely.  May also be caused by seizure activity in the setting of recent stroke.  Impression: Presyncopal/Syncopal  episodes due  to orthostatic hypotension and possibly related to known history of sinus pauses   Recommendations: - Neurology will sign off. Please call with questions or concerns  Mathews Argyle DNP, ACNPC-AG [facility] See Amion for schedule and pager information 05/13/2022 3:04 PM

## 2022-05-13 NOTE — Progress Notes (Addendum)
PROGRESS NOTE    Barbara Mcconnell  [id number] DOB: 11-04-1934 DOA: 05/10/2022 PCP: Wardell Honour, MD   Brief Narrative:  Barbara Mcconnell is a 87 y.o. female with medical history significant of history HTN, HLD, CAD, Afib on eliquis, s/p TAVR, takotsubo cardiomyopathy, CVA, anxiety, arthritis, and GERD presents after having a fall.  Recently hospitalized 9/1-9/6 for L MCA infarct with IR intervention due to embolic source despite being on eliquis. Transitioned to Pradaxa previously. Multiple episodes of instability, dizziness and worsening ambulatory dysfunction ultimately culminating in falls with remarkable bruising due to anticoagulation. Concern over recurrent stroke vs seizure at intake - hospitalist called for admission. Neuro consulted for assistance with mental status changes/weakness and cardiology consulted for orthostatic hypotension in the setting of profound cardiac history.  Assessment & Plan:   Principal Problem:   Fall Active Problems:   Orthostatic hypotension   Dizziness   Hypertensive urgency   Abnormal CXR   Hyponatremia   PAF (paroxysmal atrial fibrillation) (HCC)   Elevated liver enzymes   History of CVA with residual deficit   (HFpEF) heart failure with preserved ejection fraction (HCC)   Nasal bone fracture   Carotid arterial disease (HCC)   S/P TAVR (transcatheter aortic valve replacement)   Dyslipidemia, goal LDL below 70   Anxiety  Ambulatory dysfunction, multifactorial Fall with traumatic injury to left ala of nasal bone - Acute CVA ruled out, EEG negative for any seizures - Patient has notable history of TAVR with transient episodes of pauses on previous outpatient telemetry while on beta-blockers - Carotid stenosis history - Orthostatic hypotension improving - Previous stroke likely playing some small role in her symptoms - See below for further details on these items separately   Hypertensive urgency Bradycardia/Sinus pause/Paroxysmal  afib Status post TAVR Orthostatic hypotension - Allow permissive HTN while supine/seated due to profound symptoms upon standing. - Cardiology following, discontinue beta-blockers in the setting of bradycardia and sinus pauses previously noted; sideline EP team to evaluate - Continue low-dose furosemide, lisinopril, rosuvastatin -Monitor for 14 days (or longer if necessary) per cards  Recent prior CVA, without evidence of acute CVA  - MRI without new findings -evolution of prior CVA as expected - Continue PT OT speech - at discharge per recs  ASCVD CAD - Previously noted severe internal carotid artery stenosis, repeat imaging here indicates less severe disease. - Defer to vascular surgery on whether not patient is still appropriate for intervention, given her episodes as above recent CVA, A-fib with failure of Eliquis as well as orthostatic hypotension which may be related to stenosis she certainly has indications for intervention. - On Pradaxa and statin  Abnormal chest imaging, POA -Incidentally noted new left lower lobe inflammation noted on chronic bronchiectasis of RML Given atypical findings questionably consistent with mycobacterial infection TB test is pending -check TB skin testing 05/14/2022 -Procalcitonin negative    Hyponatremia, hypovolemic -Likely secondary to poor p.o. intake and diuretics   Paroxysmal atrial fibrillation on chronic anticoagulation Patient had just recently had left MCA stroke last month and was switched from Eliquis to Pradaxa   Elevated liver enzymes Chronic. Hepatitis A IgM positive   History of CVA with residual deficit Patient suffered left MCA infarct with left M2 occlusion status post interventional radiology intervention with reconstitution of left M3, embolic secondary to A-fib despite being on Eliquis in September.  Patient switched to Pradaxa. -Continue statin and Pradaxa   Heart failure with preserved EF, not in acute exacerbation EF  65-70%   Nasal  bone fracture secondary to fall Patient found to have a slightly displaced fracture of the left ala of nasal bones. -Patient likely can follow-up in the outpatient setting with ENT if needed   Dyslipidemia Home medication regimen includes Crestor 20 mg daily. -Continue Crestor   Anxiety -Continue Lexapro   DVT prophylaxis: Pradaxa Code Status: DNR Family Communication: Daughter updated over the phone  Status is: Inpatient  Dispo: The patient is from: ALF              Anticipated d/c is to: ALF + HHPT/OT/SLP              Anticipated d/c date is: 05/14/22              Patient currently not medically stable for discharge - continued monitoring prior to discharge in am  Consultants:  Neurology, cardiology, EP  Procedures:  Pending above  Antimicrobials:  None none  Subjective: No acute issues or events overnight denies nausea vomiting diarrhea constipation headache fevers chills or chest pain  Objective: Vitals:   05/12/22 2344 05/13/22 0336 05/13/22 0730 05/13/22 1209  BP: 133/62 (!) 165/74 136/73   Pulse: 77 82 81 85  Resp: (!) 24 19 16 18   Temp: 98.7 F (37.1 C) 97.7 F (36.5 C) 97.7 F (36.5 C) (!) 97.5 F (36.4 C)  TempSrc: Oral Oral Oral Oral  SpO2: 97% 98% 93% 96%    Intake/Output Summary (Last 24 hours) at 05/13/2022 1516 Last data filed at 05/13/2022 1119 Gross per 24 hour  Intake 123 ml  Output --  Net 123 ml   There were no vitals filed for this visit.  Examination:  General:  Pleasantly resting in bed, No acute distress. HEENT:  Normocephalic atraumatic.  Sclerae nonicteric, noninjected.  Extraocular movements intact bilaterally.  Mild aphasia and word finding difficulty Neck:  Without mass or deformity.  Trachea is midline. Lungs:  Clear to auscultate bilaterally without rhonchi, wheeze, or rales. Heart:  Regular rate and rhythm.  Without murmurs, rubs, or gallops. Abdomen:  Soft, nontender, nondistended.  Without guarding or  rebound. Extremities: Without cyanosis, clubbing, edema, or obvious deformity. Vascular:  Dorsalis pedis and posterior tibial pulses palpable bilaterally. Skin:  Warm and dry, no erythema, no ulcerations.  Data Reviewed: I have personally reviewed following labs and imaging studies  CBC: Recent Labs  Lab 05/10/22 1532 05/12/22 0311  WBC 8.0 7.4  NEUTROABS 5.7  --   HGB 10.8* 8.8*  HCT 34.2* 27.0*  MCV 91.7 90.6  PLT 280 315   Basic Metabolic Panel: Recent Labs  Lab 05/10/22 1532 05/11/22 0958 05/13/22 0358  NA 133* 134* 134*  K 4.1 3.5 3.5  CL 98 100 100  CO2 28 27 25   GLUCOSE 92 158* 103*  BUN 19 12 15   CREATININE 0.96 0.99 1.02*  CALCIUM 10.0 9.1 9.1  MG  --   --  2.1   GFR: Estimated Creatinine Clearance: 30.4 mL/min (A) (by C-G formula based on SCr of 1.02 mg/dL (H)). Liver Function Tests: Recent Labs  Lab 05/10/22 1532 05/11/22 0958 05/13/22 0358  AST 53* 50* 38  ALT 30 26 22   ALKPHOS 53 47 41  BILITOT 1.5* 1.7* 1.3*  PROT 7.7 6.1* 5.6*  ALBUMIN 4.3 3.3* 3.1*   No results for input(s): "LIPASE", "AMYLASE" in the last 168 hours. Recent Labs  Lab 05/10/22 1532  AMMONIA 19   Coagulation Profile: No results for input(s): "INR", "PROTIME" in the last 168 hours. Cardiac Enzymes: No  results for input(s): "CKTOTAL", "CKMB", "CKMBINDEX", "TROPONINI" in the last 168 hours. BNP (last 3 results) No results for input(s): "PROBNP" in the last 8760 hours. HbA1C: No results for input(s): "HGBA1C" in the last 72 hours. CBG: Recent Labs  Lab 05/10/22 1515  GLUCAP 80   Lipid Profile: No results for input(s): "CHOL", "HDL", "LDLCALC", "TRIG", "CHOLHDL", "LDLDIRECT" in the last 72 hours. Thyroid Function Tests: Recent Labs    05/10/22 1532  TSH 0.623   Anemia Panel: No results for input(s): "VITAMINB12", "FOLATE", "FERRITIN", "TIBC", "IRON", "RETICCTPCT" in the last 72 hours. Sepsis Labs: Recent Labs  Lab 05/11/22 0958  PROCALCITON <0.10    No  results found for this or any previous visit (from the past 240 hour(s)).       Radiology Studies: Overnight EEG with video  Result Date: 05/12/2022 Lora Havens, MD     05/12/2022 12:02 PM Patient Name: Tinzley Kershaw MRN: [id number] Epilepsy Attending: Lora Havens Referring Physician/Provider: Katy Apo, NP Duration: 05/11/2022 2340 to 05/12/2022 1100 Patient history: 87 year old patient with recent stroke presents with recurrent episodes of imbalance, dizziness and "zoning out", one of which led to a fall. EEG to evaluate for seizure Level of alertness: Awake, asleep AEDs during EEG study: None Technical aspects: This EEG study was done with scalp electrodes positioned according to the 10-20 International system of electrode placement. Electrical activity was reviewed with band pass filter of 1-70Hz , sensitivity of 7 uV/mm, display speed of 74mm/sec with a 60Hz  notched filter applied as appropriate. EEG data were recorded continuously and digitally stored.  Video monitoring was available and reviewed as appropriate. Description: The posterior dominant rhythm consists of 9-10 Hz activity of moderate voltage (25-35 uV) seen predominantly in posterior head regions, symmetric and reactive to eye opening and eye closing. Sleep was characterized by vertex waves, sleep spindles (12 to 14 Hz), maximal frontocentral region.  Hyperventilation and photic stimulation were not performed.   IMPRESSION: This study is within normal limits. No seizures or epileptiform discharges were seen throughout the recording. A normal interictal EEG does not exclude the diagnosis of epilepsy. Priyanka Barbra Sarks     Scheduled Meds:  dabigatran  150 mg Oral Q12H   escitalopram  5 mg Oral Daily   furosemide  10 mg Oral BID   guaiFENesin  600 mg Oral BID   lidocaine  2 patch Transdermal Q24H   lisinopril  1.25 mg Oral Daily   loratadine  10 mg Oral Daily   melatonin  5 mg Oral QHS   montelukast  10 mg Oral  QPC supper   rosuvastatin  20 mg Oral Daily   sodium chloride flush  3 mL Intravenous Q12H   tuberculin  5 Units Intradermal Once   Continuous Infusions:   LOS: 3 days   Time spent: 33min  Zykerria Tanton C Jaquelynn Wanamaker, DO [facility]  If 7PM-7AM, please contact night-coverage [email or web address]  05/13/2022, 3:16 PM

## 2022-05-13 NOTE — TOC Progression Note (Signed)
Transition of Care [facility]) - Progression Note    Patient Details  Name: Barbara Mcconnell MRN: [id number] Date of Birth: 01-Nov-1934  Transition of Care [facility]) CM/SW [place], [place] Phone Number: 05/13/2022, 4:08 PM  Clinical Narrative:   CSW initially updated this morning that patient would be monitored by cardiology over the weekend, return to ALF initially planned for Monday and daughter was updated. CSW later updated by MD that patient cleared by cardiology, can return to ALF tomorrow. CSW updated April with [facility], confirmed patient could return and sent discharge FL2 for their review today. CSW confirmed receipt. CSW updated [facility] that patient will return to [facility] tomorrow, orders for [facility] PT/OT/SLP sent to [employer]. CSW left voicemail for daughter to update that all paperwork was complete for patient to return tomorrow, everything is setup for her. CSW to follow for official discharge back to ALF tomorrow, pending medical stability overnight.    Expected Discharge Plan: Assisted Living Barriers to Discharge: Continued Medical Work up  Expected Discharge Plan and Services Expected Discharge Plan: Assisted Living     Post Acute Care Choice: [place] arrangements for the past 2 months: [place], [place]                           HH Arranged: PT, OT, Speech Therapy HH Agency: [place] Date [place]: 05/12/22   Representative spoke with at [place]: [place]) Interventions    Readmission Risk Interventions    08/04/2021    3:35 PM  Readmission Risk Prevention Plan  Transportation Screening Complete  PCP or Specialist Appt within 5-7 Days Complete  Home Care Screening Complete  Medication Review (RN CM) Complete

## 2022-05-14 DIAGNOSIS — I5032 Chronic diastolic (congestive) heart failure: Secondary | ICD-10-CM | POA: Diagnosis not present

## 2022-05-14 DIAGNOSIS — W19XXXA Unspecified fall, initial encounter: Secondary | ICD-10-CM | POA: Diagnosis not present

## 2022-05-14 NOTE — Progress Notes (Signed)
Physical Therapy Treatment Patient Details Name: Barbara Mcconnell MRN: [id number] DOB: 12-Jun-1935 Today's Date: 05/14/2022   History of Present Illness 87 yo with recent history of stroke was admitted 10/17 for fall with L eye bruising, no acute changes on MRI.  Pt is suspected of seizures, has carotid stenosis and recent L MCA infarct.  Residual aphasia issues.  PMHx:  L MCA infarct, aphasia, TAVR, atherosclerosis, PAF, anxiety, aortic stenosis, OA, CAD, heart murmur, HTN    PT Comments    Pt reports being limited by fatigue multiple times during PT session. Pt demonstrates balance deviations with eyes closed and with eyes open when having narrowed stance or semi-tandem stance. Pt requires cueing to utilize RW at this time, raising concerns for safety awareness and falls risk at this time. PT continues to recommend SNF placement at this time unless the pt has supervision for all out of bed mobility at ALF.   Recommendations for follow up therapy are one component of a multi-disciplinary discharge planning process, led by the attending physician.  Recommendations may be updated based on patient status, additional functional criteria and insurance authorization.  Follow Up Recommendations  Skilled nursing-short term rehab (<3 hours/day) Can patient physically be transported by private vehicle: Yes   Assistance Recommended at Discharge Frequent or constant Supervision/Assistance  Patient can return home with the following A little help with walking and/or transfers;A little help with bathing/dressing/bathroom;Assistance with cooking/housework;Direct supervision/assist for medications management;Direct supervision/assist for financial management;Assist for transportation;Help with stairs or ramp for entrance   Equipment Recommendations  None recommended by PT    Recommendations for Other Services       Precautions / Restrictions Precautions Precautions: Fall Precaution Comments: R  hemiparesis Restrictions Weight Bearing Restrictions: No     Mobility  Bed Mobility Overal bed mobility: Needs Assistance Bed Mobility: Supine to Sit, Sit to Supine     Supine to sit: Supervision Sit to supine: Supervision        Transfers Overall transfer level: Needs assistance Equipment used: None Transfers: Sit to/from Stand Sit to Stand: Min guard                Ambulation/Gait Ambulation/Gait assistance: Min guard Gait Distance (Feet): 150 Feet Assistive device: Rolling walker (2 wheels) Gait Pattern/deviations: Step-through pattern Gait velocity: reduced Gait velocity interpretation: <1.8 ft/sec, indicate of risk for recurrent falls   General Gait Details: slowed step-through gait, pt requires cues to remember to utilize [employer]    Modified Rankin (Stroke Patients Only)       Balance Overall balance assessment: Needs assistance Sitting-balance support: No upper extremity supported, Feet supported Sitting balance-Leahy Scale: Fair     Standing balance support: Single extremity supported, Reliant on assistive device for balance Standing balance-Leahy Scale: Poor           Rhomberg - Eyes Opened: 20 Rhomberg - Eyes Closed: 10 (loss of balance to left side and posteriorly)   High Level Balance Comments: unable to obtain tandem stance without UE support, takes step forward and hold with eyes open for 15 seconds prior to LOB, unable to maintain SLS on either leg for >2 seconds.            Cognition Arousal/Alertness: Awake/alert Behavior During Therapy: Impulsive, Restless Overall Cognitive Status: Impaired/Different from baseline Area of Impairment: Orientation, Attention, Memory, Following commands, Safety/judgement, Awareness, Problem solving  Orientation Level: Disoriented to, Time Current Attention Level: Sustained Memory: Decreased recall of precautions, Decreased  short-term memory Following Commands: Follows one step commands consistently Safety/Judgement: Decreased awareness of safety, Decreased awareness of deficits Awareness: Emergent Problem Solving: Slow processing, Requires verbal cues          Exercises      General Comments General comments (skin integrity, edema, etc.): tachy up to low 130s during session, pt denies dizziness or lightheadedness but her ability to report symptoms may be affected by expressive aphasia from recent CVA      Pertinent Vitals/Pain Pain Assessment Pain Assessment: No/denies pain    Home Living                          Prior Function            PT Goals (current goals can now be found in the care plan section) Acute Rehab PT Goals Patient Stated Goal: to go directly to her home Progress towards PT goals: Progressing toward goals    Frequency    Min 3X/week      PT Plan Current plan remains appropriate    Co-evaluation              AM-PAC PT "6 Clicks" Mobility   Outcome Measure  Help needed turning from your back to your side while in a flat bed without using bedrails?: None Help needed moving from lying on your back to sitting on the side of a flat bed without using bedrails?: A Little Help needed moving to and from a bed to a chair (including a wheelchair)?: A Little Help needed standing up from a chair using your arms (e.g., wheelchair or bedside chair)?: A Little Help needed to walk in hospital room?: A Little Help needed climbing 3-5 steps with a railing? : Total 6 Click Score: 17    End of Session   Activity Tolerance: Patient limited by fatigue Patient left: in bed;with call bell/phone within reach;with bed alarm set Nurse Communication: Mobility status PT Visit Diagnosis: Unsteadiness on feet (R26.81);Muscle weakness (generalized) (M62.81);Other abnormalities of gait and mobility (R26.89);History of falling (Z91.81);Difficulty in walking, not elsewhere  classified (R26.2);Hemiplegia and hemiparesis Hemiplegia - Right/Left: Right Hemiplegia - dominant/non-dominant: Dominant Hemiplegia - caused by: Cerebral infarction     Time: 2202-5427 PT Time Calculation (min) (ACUTE ONLY): 16 min  Charges:  $Neuromuscular Re-education: 8-22 mins                     Zenaida Niece, PT, DPT Acute Rehabilitation Office [phone]    Zenaida Niece 05/14/2022, 5:15 PM

## 2022-05-14 NOTE — Progress Notes (Signed)
Talked with pt and daughter Barbara Mcconnell about the Optometrist. Explained that if pt felt dizzy, SOB, or CP she is to push the button and it will mark the area on the recorder along with anything her heart is doing at that time. If pt doesn't push the button but there is still an event that went on the recording is going to record it, so the recording is going to record anything that is going on with her heart. The phone is because things will go to her [facility]) and if something that needs something done then [place] can call her mom and let her know and if EMS needs to be called Barbara Mcconnell can do that. Usually the pt keeps a journal of every time she pushes the button with the time, what she was feeling, and how long it lasted. With it going to [facility] phone the pt doesn't need to keep the journal and since in Jan. Pt became really anxious with the company calling her it was set up for the phone call to come to [place]. Barbara Mcconnell and pt stated thanks now they understand better. They thought that they were supposed to push the button, reset it, and do some intervention.

## 2022-05-14 NOTE — Care Management Important Message (Signed)
Important Message  Patient Details  Name: Barbara Mcconnell MRN: [id number] Date of Birth: [birth year]   Medicare Important Message Given:  Yes     Carles Collet, RN 05/14/2022, 10:48 AM

## 2022-05-14 NOTE — Progress Notes (Signed)
Mobility Specialist Progress Note:   05/14/22 1005  Mobility  Activity Ambulated with assistance in hallway  Level of Assistance Contact guard assist, steadying assist  Assistive Device Front wheel walker  Distance Ambulated (ft) 220 ft  Activity Response Tolerated well  Mobility Referral Yes  $Mobility charge 1 Mobility   Pt eager for mobility after bath. Required only minG assist for safety. Pt back in bed with all needs met, set up to call daughter on phone.  Nelta Numbers Acute Rehab Secure Chat or Office Phone: [phone]

## 2022-05-14 NOTE — Progress Notes (Signed)
Mobility Specialist Progress Note:   05/14/22 0915  Mobility  Activity Ambulated with assistance to bathroom  Level of Assistance Contact guard assist, steadying assist  Assistive Device None  Distance Ambulated (ft) 30 ft  Activity Response Tolerated well  Mobility Referral Yes  $Mobility charge 1 Mobility   Pt requesting to wash up prior to mobility session. Ambulated to BR then left sitting up at sink for bath. NT in room. Will f/u for ambulation.  Nelta Numbers Acute Rehab Secure Chat or Office Phone: [phone]

## 2022-05-14 NOTE — Care Management (Addendum)
Spoke with daughter Barbara Mcconnell over the phone to discuss DC plan.   Barbara Mcconnell voiced concerns about her mother discharging to ALF and ultimately chose to appeal DC.  11:00 We reviewed over the phone and she agreed to verbal signature for IM, HINN 12, DND. These forms were left in the patient's room for her, and the patient was updated that [place] appealed DC and she would not DC today. Holly instructed to call Judithann Graves to file for requested appeal and she was provided with [place]'s phone number.   Holly Clinical research associate back the [employer] Case ID: [id number]. Received Meredith Staggers confirming appeal and Case ID#.  11:45 Forms completed in Epic and uploaded to [employer].  Confirmation# [id number] [phone]

## 2022-05-15 NOTE — Discharge Summary (Signed)
Physician Discharge Summary   Patient: Barbara Mcconnell MRN: [id number] DOB: 04-Jan-1935  Admit date:     05/10/2022  Discharge date: 05/14/2022  Discharge Physician: Little Ishikawa   PCP: Wardell Honour, MD   Recommendations at discharge:    Follow up with PCP Follow up with cardiology for heart monitor discussion/evaluation  Discharge Diagnoses: Principal Problem:   Fall Active Problems:   Orthostatic hypotension   Dizziness   Hypertensive urgency   Abnormal CXR   Hyponatremia   PAF (paroxysmal atrial fibrillation) (HCC)   Elevated liver enzymes   History of CVA with residual deficit   (HFpEF) heart failure with preserved ejection fraction (HCC)   Nasal bone fracture   Carotid arterial disease (HCC)   S/P TAVR (transcatheter aortic valve replacement)   Dyslipidemia, goal LDL below 70   Anxiety   Patient remains medically stable for discharge - awaiting appeal evaluation prior to discharge back to ALF.  Hospital Course: Barbara Mcconnell is a 87 y.o. female with medical history significant of history HTN, HLD, CAD, Afib on eliquis, s/p TAVR, takotsubo cardiomyopathy, CVA, anxiety, arthritis, and GERD presents after having a fall.   Recently hospitalized 9/1-9/6 for L MCA infarct with IR intervention due to embolic source despite being on eliquis. Transitioned to Pradaxa previously. Multiple episodes of instability, dizziness and worsening ambulatory dysfunction ultimately culminating in falls with remarkable bruising due to anticoagulation. Concern over recurrent stroke vs seizure at intake - hospitalist called for admission. Neuro consulted for assistance with mental status changes/weakness and cardiology consulted for orthostatic hypotension in the setting of profound cardiac history.  Patient admitted as above for transient episodes of confusion and worsening ambulatory dysfunction. Chart review by cardiology revealed history of pauses/bradycardia as such beta blockers were  discontinued which was tolerated well. Occasional beats of ectopy on tele here without any further symptoms. Ambulating well without any further orthostatic symptoms. Heart monitor placed by cardiology to be follow up in the outpatient setting to further discuss those findings. Otherwise stable and agreeable for DC back to facility.    Assessment and Plan: Ambulatory dysfunction, multifactorial Fall with traumatic injury to left ala of nasal bone - Acute CVA ruled out, EEG negative for any seizures - Patient has notable history of TAVR with transient episodes of pauses on previous outpatient telemetry while on beta-blockers - Carotid stenosis history - Orthostatic hypotension improving - Previous stroke likely playing some small role in her symptoms - See below for further details on these items separately   Hypertensive urgency, resolved Bradycardia/Sinus pause/Paroxysmal afib Status post TAVR Orthostatic hypotension - BP well controlled - allow for mild htn at rest to ensure no worsening symptoms with orthostatics. - Cardiology following, discontinue beta-blockers in the setting of bradycardia and sinus pauses previously noted; ambulatory heart monitor in place - Continue low-dose furosemide, lisinopril, rosuvastatin   Recent prior CVA, without evidence of acute CVA  - MRI without new findings -evolution of prior CVA as expected - Continue PT OT speech at discharge per prior recs.   ASCVD CAD - Previously noted severe internal carotid artery stenosis, repeat imaging here indicates less severe disease. - Defer to vascular surgery on whether not patient is still appropriate for intervention, given her episodes as above recent CVA, A-fib with failure of Eliquis as well as orthostatic hypotension which may be related to stenosis she certainly has indications for intervention. - On Pradaxa and statin   Abnormal chest imaging, POA -Incidentally noted new left lower lobe inflammation noted  on chronic bronchiectasis of RML Given atypical findings questionably consistent with mycobacterial infection TB test was performed and is unremarkable -Procalcitonin negative    Hyponatremia, hypovolemic -Likely secondary to poor p.o. intake and diuretics   Paroxysmal atrial fibrillation on chronic anticoagulation Patient had just recently had left MCA stroke last month and was switched from Eliquis to Pradaxa   Elevated liver enzymes Chronic. Hepatitis A IgM positive   History of CVA with residual deficits Patient suffered left MCA infarct with left M2 occlusion status post interventional radiology intervention with reconstitution of left M3, embolic secondary to A-fib despite being on Eliquis in September.  Patient switched to Pradaxa. -Continue statin and Pradaxa   Heart failure with preserved EF, not in acute exacerbation EF 65-70%   Nasal bone fracture secondary to fall Patient found to have a slightly displaced fracture of the left ala of nasal bones. -Patient likely can follow-up in the outpatient setting with ENT if needed   Dyslipidemia Home medication regimen includes Crestor 20 mg daily. -Continue Crestor   Anxiety -Continue Lexapro      Consultants: Cardiology, Neuro Procedures performed: EEG  Disposition: Assisted living Diet recommendation:  Regular diet DISCHARGE MEDICATION: Allergies as of 05/15/2022       Reactions   Codeine Nausea And Vomiting   Cyclobenzaprine Other (See Comments)   Unknown reaction   Iodinated Contrast Media Nausea Only   Prednisone Other (See Comments)   "Sores in the mouth"        Medication List     STOP taking these medications    metoprolol tartrate 25 MG tablet Commonly known as: LOPRESSOR       TAKE these medications    acetaminophen 500 MG tablet Commonly known as: TYLENOL Take 500-1,000 mg by mouth every 6 (six) hours as needed for moderate pain.   albuterol 108 (90 Base) MCG/ACT inhaler Commonly  known as: VENTOLIN HFA Inhale 2 puffs into the lungs every 4 (four) hours as needed for wheezing or shortness of breath (seasonal allergies).   ASPERCREME EX Apply 1 application topically daily as needed (knee pain).   dabigatran 150 MG Caps capsule Commonly known as: PRADAXA Take 1 capsule (150 mg total) by mouth every 12 (twelve) hours.   docusate sodium 100 MG capsule Commonly known as: COLACE Take 100 mg by mouth 2 (two) times daily.   EMERGEN-C VITAMIN C PO Take 1 packet by mouth daily. Mix with water   escitalopram 5 MG tablet Commonly known as: Lexapro Take 1 tablet (5 mg total) by mouth daily. What changed: how much to take   famotidine 10 MG tablet Commonly known as: PEPCID Take 10 mg by mouth daily as needed for heartburn.   fexofenadine 60 MG tablet Commonly known as: ALLEGRA Take 60 mg by mouth daily.   furosemide 20 MG tablet Commonly known as: LASIX Take 0.5 tablets (10 mg total) by mouth 2 (two) times daily.   lisinopril 5 MG tablet Commonly known as: ZESTRIL Take 1.25 mg by mouth daily.   melatonin 5 MG Tabs Take 5 mg by mouth at bedtime.   montelukast 10 MG tablet Commonly known as: SINGULAIR Take 1 tablet (10 mg total) by mouth daily after supper.   multivitamin with minerals Tabs tablet Take 1 tablet by mouth daily. Centrum 50 plus   rosuvastatin 20 MG tablet Commonly known as: CRESTOR Take 1 tablet (20 mg total) by mouth daily.   VITAMIN D3 PO Take 1,000 Units by mouth daily.  Follow-up Information     Martinique, Peter M, MD Follow up on 06/03/2022.   Specialty: Cardiology Why: at 4:00 PM Contact information: [address] [place] [address] [phone]                Discharge Exam: There were no vitals filed for this visit. Physical Exam Constitutional:      General: She is awake.     Appearance: Normal appearance.  HENT:     Head: Contusion present.  Eyes:     General: Lids are normal.   Cardiovascular:     Rate and Rhythm: Normal rate and regular rhythm.  Pulmonary:     Effort: Pulmonary effort is normal.     Breath sounds: Normal breath sounds and air entry.  Musculoskeletal:     Cervical back: Full passive range of motion without pain.  Neurological:     Mental Status: She is alert. Mental status is at baseline.     Motor: Motor function is intact.     Coordination: Coordination is intact.  Psychiatric:        Mood and Affect: Mood and affect normal.        Behavior: Behavior is cooperative.    Condition at discharge: stable  The results of significant diagnostics from this hospitalization (including imaging, microbiology, ancillary and laboratory) are listed below for reference.   Imaging Studies: Overnight EEG with video  Result Date: 05/12/2022 Lora Havens, MD     05/12/2022 12:02 PM Patient Name: Ryatt Style MRN: [id number] Epilepsy Attending: Lora Havens Referring Physician/Provider: Katy Apo, NP Duration: 05/11/2022 2340 to 05/12/2022 1100 Patient history: 87 year old patient with recent stroke presents with recurrent episodes of imbalance, dizziness and "zoning out", one of which led to a fall. EEG to evaluate for seizure Level of alertness: Awake, asleep AEDs during EEG study: None Technical aspects: This EEG study was done with scalp electrodes positioned according to the 10-20 International system of electrode placement. Electrical activity was reviewed with band pass filter of 1-70Hz , sensitivity of 7 uV/mm, display speed of 23mm/sec with a 60Hz  notched filter applied as appropriate. EEG data were recorded continuously and digitally stored.  Video monitoring was available and reviewed as appropriate. Description: The posterior dominant rhythm consists of 9-10 Hz activity of moderate voltage (25-35 uV) seen predominantly in posterior head regions, symmetric and reactive to eye opening and eye closing. Sleep was characterized by vertex  waves, sleep spindles (12 to 14 Hz), maximal frontocentral region.  Hyperventilation and photic stimulation were not performed.   IMPRESSION: This study is within normal limits. No seizures or epileptiform discharges were seen throughout the recording. A normal interictal EEG does not exclude the diagnosis of epilepsy. Lora Havens   MR CERVICAL SPINE WO CONTRAST  Result Date: 05/11/2022 CLINICAL DATA:  Cervical radiculopathy, no red flags EXAM: MRI CERVICAL SPINE WITHOUT CONTRAST TECHNIQUE: Multiplanar, multisequence MR imaging of the cervical spine was performed. No intravenous contrast was administered. COMPARISON:  CT cervical spine May 10, 2022. FINDINGS: Alignment: Mild anterolisthesis of C2 on C3 and C3 on C4. Vertebrae: Degenerative/discogenic endplate signal changes in the lower cervical spine. No specific evidence of acute fracture or discitis/osteomyelitis. No suspicious bone lesions. Cord: Normal cord signal. Posterior Fossa, vertebral arteries, paraspinal tissues: Visualized vertebral artery flow voids are maintained. No evidence of acute abnormality in the visualized posterior fossa. Disc levels: C2-C3: Small posterior disc osteophyte complex with right greater than left facet and  uncovertebral hypertrophy. Resulting mild to moderate right and mild left foraminal stenosis. Mild canal stenosis. C3-C4: Posterior disc osteophyte complex with bilateral facet and uncovertebral hypertrophy. Resulting severe bilateral foraminal stenosis. Mild to moderate canal stenosis. C4-C5: Posterior disc osteophyte complex with bilateral facet uncovertebral hypertrophy. Resulting moderate bilateral foraminal stenosis. Mild to moderate canal stenosis. C5-C6: Posterior disc osteophyte complex with right greater than left facet and uncovertebral hypertrophy. Resulting severe right foraminal stenosis. Mild canal stenosis. Patent left foramen. C6-C7: Posterior disc osteophyte complex with right greater than left  facet and uncovertebral hypertrophy. Resulting moderate right foraminal stenosis. Patent canal and left foramen. C7-T1: No significant disc protrusion, foraminal stenosis, or canal stenosis. IMPRESSION: 1. Severe foraminal stenosis bilaterally at C3-C4 and on the right at C5-C6. Moderate foraminal stenosis on the right at C6-C7. Milder multilevel foraminal stenosis is detailed above. 2. Mild to moderate canal stenosis at C3-C4 and C4-C5. Mild canal stenosis at C2-C3 and C5-C6. Electronically Signed   By: Margaretha Sheffield M.D.   On: 05/11/2022 15:11   CT CHEST WO CONTRAST  Result Date: 05/11/2022 CLINICAL DATA:  Respiratory illness.  Cough.  Recent fall EXAM: CT CHEST WITHOUT CONTRAST TECHNIQUE: Multidetector CT imaging of the chest was performed following the standard protocol without IV contrast. RADIATION DOSE REDUCTION: This exam was performed according to the departmental dose-optimization program which includes automated exposure control, adjustment of the mA and/or kV according to patient size and/or use of iterative reconstruction technique. COMPARISON:  X-ray 05/10/2022, CT 07/24/2021 FINDINGS: Cardiovascular: Heart size is upper limits of normal. No pericardial effusion. Postsurgical changes of TAVR. Thoracic aorta is nonaneurysmal. Atherosclerotic vascular calcifications of the aorta and coronary arteries. Central pulmonary vasculature is upper limits of normal in size. Mediastinum/Nodes: No enlarged mediastinal or axillary lymph nodes. Thyroid gland, trachea, and esophagus demonstrate no significant findings. Lungs/Pleura: Chronic bronchiectasis within the right middle lobe and lingula with peribronchovascular nodularity, not significantly changed from prior. New site of tree-in-bud nodularity within the superior segment of the left lower lobe (series 8, image 56). Scattered areas of tree-in-bud nodularity within the bilateral lung apices is unchanged. Left basilar atelectasis. No pleural effusion  or pneumothorax. Upper Abdomen: No acute abnormality. Musculoskeletal: Mildly exaggerated thoracic kyphosis. No acute bony or chest wall abnormality. IMPRESSION: 1. New site of tree-in-bud nodularity within the superior segment of the left lower lobe, compatible with an infectious or inflammatory bronchiolitis. 2. Chronic bronchiectasis within the right middle lobe and lingula with peribronchovascular nodularity suggesting sequela of chronic atypical infection such as mycobacteria. 3. Aortic and coronary artery atherosclerosis (ICD10-I70.0). Electronically Signed   By: Davina Poke D.O.   On: 05/11/2022 11:23   MR BRAIN WO CONTRAST  Result Date: 05/11/2022 CLINICAL DATA:  Stroke follow-up EXAM: MRI HEAD WITHOUT CONTRAST TECHNIQUE: Multiplanar, multiecho pulse sequences of the brain and surrounding structures were obtained without intravenous contrast. COMPARISON:  MRI Brain 03/26/22, CT head 05/10/22 FINDINGS: Brain: Redemonstrated subacute infarct in the posterior left frontal lobe with persistent hyperintense signal diffusion-weighted imaging and decreased signal on the ADC map. Compared to prior exam there is increased volume loss in this region. Additional scattered punctate areas of infarction seen on prior MRI are not visualized on this exam. There is a small punctate focus of microhemorrhage in the anterior left temporal lobe. There is no evidence of associated with the area of infarction. There is sequela mild overall chronic microvascular ischemic change. Chronic bilateral cerebellar infarcts. Unchanged size and shape of the ventricular system. Vascular: Normal flow voids. Skull  and upper cervical spine: No suspicious osseous lesions are visualized. There is stepwise anterolisthesis of C2 on C3, C3 on C4, and C4 on C5. Sinuses/Orbits: Bilateral lens replacement. Other: There is a small left frontal scalp hematoma measuring up to 1.6 cm. IMPRESSION: 1. No acute intracranial abnormality 2. Evolving  left MCA territory infarct with increased volume loss. No new infarct or hemorrhage. 3. Small left frontal scalp hematoma measuring up to 1.6 cm. Electronically Signed   By: Marin Roberts M.D.   On: 05/11/2022 09:30   CT MAXILLOFACIAL WO CONTRAST  Result Date: 05/10/2022 CLINICAL DATA:  Trauma, fall EXAM: CT MAXILLOFACIAL WITHOUT CONTRAST TECHNIQUE: Multidetector CT imaging of the maxillofacial structures was performed. Multiplanar CT image reconstructions were also generated. RADIATION DOSE REDUCTION: This exam was performed according to the departmental dose-optimization program which includes automated exposure control, adjustment of the mA and/or kV according to patient size and/or use of iterative reconstruction technique. COMPARISON:  None Available. FINDINGS: Osseous: Fracture is seen in the left ala of nasal bones with 1 mm offset in alignment. No other fractures are seen. Degenerative changes are noted in left TMJ. Orbits: Optic globes appear symmetrical. Retrobulbar soft tissues are unremarkable. There is no demonstrable blowout fracture in the orbits. Sinuses: There are no air-fluid levels or significant mucosal thickening. Soft tissues: There is large subcutaneous hematoma in the left periorbital region extending to the left frontal scalp. Limited intracranial: Unremarkable. IMPRESSION: There is slightly displaced fracture in the left ala of nasal bones. No other fractures are seen in the facial bones. Significant degenerative changes are noted in left TMJ. Optic globes and retrobulbar soft tissues are unremarkable. There are no air-fluid levels or significant mucosal thickening in paranasal sinuses. Electronically Signed   By: Elmer Picker M.D.   On: 05/10/2022 16:49   CT Cervical Spine Wo Contrast  Result Date: 05/10/2022 CLINICAL DATA:  Trauma, fall EXAM: CT CERVICAL SPINE WITHOUT CONTRAST TECHNIQUE: Multidetector CT imaging of the cervical spine was performed without intravenous  contrast. Multiplanar CT image reconstructions were also generated. RADIATION DOSE REDUCTION: This exam was performed according to the departmental dose-optimization program which includes automated exposure control, adjustment of the mA and/or kV according to patient size and/or use of iterative reconstruction technique. COMPARISON:  Trauma, fall FINDINGS: Alignment: There is mild anterolisthesis at C2-C3 and C3-C4 levels, possibly suggesting previous ligament injury and facet degeneration. There is mild dextroscoliosis. Skull base and vertebrae: No recent fracture is seen. Degenerative changes are noted. Soft tissues and spinal canal: There is extrinsic pressure over the ventral margin of thecal sac caused by posterior bony spurs and bulging of annulus at multiple levels. Disc levels: There is encroachment of neural foramina by bony spurs and facet hypertrophy from C2 to C7 levels. Upper chest: Pleural thickening is seen in the apices. Few blebs are noted. There is peribronchial thickening, possibly suggesting bronchitis. Other: There is inhomogeneous attenuation in thyroid. IMPRESSION: No recent fracture is seen in cervical spine. Cervical spondylosis with encroachment of neural foramina from C2-C7 levels. Electronically Signed   By: Elmer Picker M.D.   On: 05/10/2022 16:42   CT Head Wo Contrast  Result Date: 05/10/2022 CLINICAL DATA:  Trauma, fall EXAM: CT HEAD WITHOUT CONTRAST TECHNIQUE: Contiguous axial images were obtained from the base of the skull through the vertex without intravenous contrast. RADIATION DOSE REDUCTION: This exam was performed according to the departmental dose-optimization program which includes automated exposure control, adjustment of the mA and/or kV according to patient size and/or  use of iterative reconstruction technique. COMPARISON:  Previous CT done on 03/25/2022 and MR brain done on 03/26/2022 FINDINGS: Brain: There are no signs of intracranial bleeding. There is a 3  cm area of low attenuation corresponding to the acute infarct seen in the left parietal lobe in the previous MRI. No other new focal abnormalities are noted. Cortical sulci are prominent. Vascular: Unremarkable. Skull: There is subcutaneous hematoma in the left frontal scalp and left periorbital region. Sinuses/Orbits: There are no air-fluid levels in the paranasal sinuses. Other: None. IMPRESSION: No acute intracranial findings are seen. 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 Atrophy. There is left frontal scalp hematoma. No fracture is seen in calvarium. Electronically Signed   By: Elmer Picker M.D.   On: 05/10/2022 16:36   DG Chest 2 View  Result Date: 05/10/2022 CLINICAL DATA:  Fall, altered mental status EXAM: CHEST - 2 VIEW COMPARISON:  Radiograph 111 20,023, CT chest 07/24/2021 FINDINGS: Normal cardiac silhouette. No pneumothorax. No rib fracture identified. Increased nodular density in the RIGHT mid lung compared to prior radiograph. Interval reduction in RIGHT pleural effusion and LEFT pleural effusion. IMPRESSION: 1. Increased nodular density in the RIGHT mid lung could represent infection, neoplasm, or increased pleural thickening seen on comparison CT. 2. Improvement bilateral pleural effusions. 3. No evidence of thoracic trauma. Electronically Signed   By: Suzy Bouchard M.D.   On: 05/10/2022 16:02   VAS US CAROTID  Result Date: 05/06/2022 Carotid Arterial Duplex Study Patient Name:  SORIAH DENHOLM  Date of Exam:   05/06/2022 Medical Rec #: [id number]     Accession #:    [id number] Date of Birth: January 22, 1935      Patient Gender: F Patient Age:   7 years Exam Location:  [facility] Procedure:      VAS US CAROTID Referring Phys: Vonna Kotyk ROBINS --------------------------------------------------------------------------------  Indications:       Carotid artery disease. Risk Factors:      Hypertension, hyperlipidemia, coronary artery disease. Other  Factors:     Stroke on 03/25/22. Comparison Study:  09/03/21: Right: 40-59% 226/54 cm/s Left 1-39% Performing Technologist: Ralene Cork RVT  Examination Guidelines: A complete evaluation includes B-mode imaging, spectral Doppler, color Doppler, and power Doppler as needed of all accessible portions of each vessel. Bilateral testing is considered an integral part of a complete examination. Limited examinations for reoccurring indications may be performed as noted.  Right Carotid Findings: +----------+--------+--------+--------+-------------------------+--------+           PSV cm/sEDV cm/sStenosisPlaque Description       Comments +----------+--------+--------+--------+-------------------------+--------+ CCA Prox  66      16                                                +----------+--------+--------+--------+-------------------------+--------+ CCA Mid   92      13                                                +----------+--------+--------+--------+-------------------------+--------+ CCA Distal77      14                                                +----------+--------+--------+--------+-------------------------+--------+  ICA Prox  260     55      40-59%  heterogenous and calcific         +----------+--------+--------+--------+-------------------------+--------+ ICA Mid   150     24                                                +----------+--------+--------+--------+-------------------------+--------+ ICA Distal81      17                                                +----------+--------+--------+--------+-------------------------+--------+ ECA       97      0               heterogenous and calcific         +----------+--------+--------+--------+-------------------------+--------+ +----------+--------+-------+----------------+-------------------+           PSV cm/sEDV cmsDescribe        Arm Pressure (mmHG)  +----------+--------+-------+----------------+-------------------+ [id number]            Multiphasic, WNL                    +----------+--------+-------+----------------+-------------------+ +---------+--------+--+--------+-+---------+ VertebralPSV cm/s30EDV cm/s3Antegrade +---------+--------+--+--------+-+---------+  Left Carotid Findings: +----------+--------+--------+--------+----------------------+--------+           PSV cm/sEDV cm/sStenosisPlaque Description    Comments +----------+--------+--------+--------+----------------------+--------+ CCA Prox  69      11              focal and heterogenous         +----------+--------+--------+--------+----------------------+--------+ CCA Mid   90      14              focal and heterogenous         +----------+--------+--------+--------+----------------------+--------+ CCA Distal96      20                                             +----------+--------+--------+--------+----------------------+--------+ ICA Prox  87      17      1-39%   heterogenous                   +----------+--------+--------+--------+----------------------+--------+ ICA Mid   106     22                                             +----------+--------+--------+--------+----------------------+--------+ ICA Distal114     26                                             +----------+--------+--------+--------+----------------------+--------+ ECA       66      0               heterogenous                   +----------+--------+--------+--------+----------------------+--------+ +----------+--------+--------+----------------+-------------------+           PSV  cm/sEDV cm/sDescribe        Arm Pressure (mmHG) +----------+--------+--------+----------------+-------------------+ [id number]             Multiphasic, WNL                    +----------+--------+--------+----------------+-------------------+  +---------+--------+--+--------+-+---------+ VertebralPSV cm/s44EDV cm/s7Antegrade +---------+--------+--+--------+-+---------+   Summary: Right Carotid: Velocities in the right ICA are consistent with a 40-59%                stenosis. Left Carotid: Velocities in the left ICA are consistent with a 1-39% stenosis. Vertebrals:  Bilateral vertebral arteries demonstrate antegrade flow. Subclavians: Normal flow hemodynamics were seen in bilateral subclavian              arteries. *See table(s) above for measurements and observations.  Electronically signed by Orlie Pollen on 05/06/2022 at 5:01:31 PM.    Final     Microbiology: Results for orders placed or performed during the hospital encounter of 03/25/22  Resp Panel by RT-PCR (Flu A&B, Covid) Anterior Nasal Swab     Status: None   Collection Time: 03/25/22 10:57 AM   Specimen: Anterior Nasal Swab  Result Value Ref Range Status   SARS Coronavirus 2 by RT PCR NEGATIVE NEGATIVE Final    Comment: (NOTE) SARS-CoV-2 target nucleic acids are NOT DETECTED.  The SARS-CoV-2 RNA is generally detectable in upper respiratory specimens during the acute phase of infection. The lowest concentration of SARS-CoV-2 viral copies this assay can detect is 138 copies/mL. A negative result does not preclude SARS-Cov-2 infection and should not be used as the sole basis for treatment or other patient management decisions. A negative result may occur with  improper specimen collection/handling, submission of specimen other than nasopharyngeal swab, presence of viral mutation(s) within the areas targeted by this assay, and inadequate number of viral copies(<138 copies/mL). A negative result must be combined with clinical observations, patient history, and epidemiological information. The expected result is Negative.  Fact Sheet for Patients:  EntrepreneurPulse.com.au  Fact Sheet for Healthcare Providers:   IncredibleEmployment.be  This test is no t yet approved or cleared by the [place] FDA and  has been authorized for detection and/or diagnosis of SARS-CoV-2 by FDA under an Emergency Use Authorization (EUA). This EUA will remain  in effect (meaning this test can be used) for the duration of the COVID-19 declaration under Section 564(b)(1) of the Act, 21 U.S.C.section 360bbb-3(b)(1), unless the authorization is terminated  or revoked sooner.       Influenza A by PCR NEGATIVE NEGATIVE Final   Influenza B by PCR NEGATIVE NEGATIVE Final    Comment: (NOTE) The Xpert Xpress SARS-CoV-2/FLU/RSV plus assay is intended as an aid in the diagnosis of influenza from Nasopharyngeal swab specimens and should not be used as a sole basis for treatment. Nasal washings and aspirates are unacceptable for Xpert Xpress SARS-CoV-2/FLU/RSV testing.  Fact Sheet for Patients: EntrepreneurPulse.com.au  Fact Sheet for Healthcare Providers: IncredibleEmployment.be  This test is not yet approved or cleared by the [place] FDA and has been authorized for detection and/or diagnosis of SARS-CoV-2 by FDA under an Emergency Use Authorization (EUA). This EUA will remain in effect (meaning this test can be used) for the duration of the COVID-19 declaration under Section 564(b)(1) of the Act, 21 U.S.C. section 360bbb-3(b)(1), unless the authorization is terminated or revoked.  Performed at [facility], [place] [address] [place], [place] [address]   MRSA Next Gen by PCR, Nasal  Status: None   Collection Time: 03/25/22  4:53 PM   Specimen: Nasal Mucosa; Nasal Swab  Result Value Ref Range Status   MRSA by PCR Next Gen NOT DETECTED NOT DETECTED Final    Comment: (NOTE) The GeneXpert MRSA Assay (FDA approved for NASAL specimens only), is one component of a comprehensive MRSA colonization surveillance program. It is not intended to diagnose  MRSA infection nor to guide or monitor treatment for MRSA infections. Test performance is not FDA approved in patients less than 38 years old. Performed at [facility], [place] [address], [place], [place] [address]     Labs: CBC: Recent Labs  Lab 05/10/22 1532 05/12/22 0311  WBC 8.0 7.4  NEUTROABS 5.7  --   HGB 10.8* 8.8*  HCT 34.2* 27.0*  MCV 91.7 90.6  PLT 280 [id number]    Basic Metabolic Panel: Recent Labs  Lab 05/10/22 1532 05/11/22 0958 05/13/22 0358  NA 133* 134* 134*  K 4.1 3.5 3.5  CL 98 100 100  CO2 28 27 25   GLUCOSE 92 158* 103*  BUN 19 12 15   CREATININE 0.96 0.99 1.02*  CALCIUM 10.0 9.1 9.1  MG  --   --  2.1    Liver Function Tests: Recent Labs  Lab 05/10/22 1532 05/11/22 0958 05/13/22 0358  AST 53* 50* 38  ALT 30 26 22   ALKPHOS 53 47 41  BILITOT 1.5* 1.7* 1.3*  PROT 7.7 6.1* 5.6*  ALBUMIN 4.3 3.3* 3.1*    CBG: Recent Labs  Lab 05/10/22 1515  GLUCAP 80     Discharge time spent: greater than 30 minutes.  Signed: Little Ishikawa, MD [facility] 05/15/2022

## 2022-05-15 NOTE — TOC CAGE-AID Note (Signed)
Transition of Care [facility]) - CAGE-AID Screening   Patient Details  Name: Barbara Mcconnell MRN: [id number] Date of Birth: 1934-10-17  [facility]) CM/SW Contact:    Army Melia, RN Phone Number: 05/15/2022, 12:28 AM    CAGE-AID Screening:    Have You Ever Felt You Ought to Cut Down on Your Drinking or Drug Use?: No Have People Annoyed You By [employer] Your Drinking Or Drug Use?: No Have You Felt Bad Or Guilty About Your Drinking Or Drug Use?: No Have You Ever Had a Drink or Used Drugs First Thing In The Morning to Steady Your Nerves or to Get Rid of a Hangover?: No CAGE-AID Score: 0  Substance Abuse Education Offered: No (drinks occasional wine, no resources indicated)

## 2022-05-15 NOTE — TOC Transition Note (Signed)
Transition of Care [facility]) - CM/SW Discharge Note   Patient Details  Name: Barbara Mcconnell MRN: [id number] Date of Birth: Jul 30, 1934  Transition of Care [facility]) CM/SW Contact:  Amador Cunas, [place] Phone Number: 05/15/2022, 8:49 AM   Clinical Narrative:  Pt for dc back to [facility] today. Spoke to [place] at  [facility] who confirmed pt is able to return. DC summary and FL2 faxed to facility [phone] and copy to be sent with dtr. Pt's dtr [facility] aware of dc and will provide transport. SW signing off at dc.   Wandra Feinstein, MSW, LCSW [phone] (coverage)      Final next level of care: Assisted Living Barriers to Discharge: No Barriers Identified   Patient Goals and CMS Choice Patient states their goals for this hospitalization and ongoing recovery are:: patient unable to participate in goal setting, not fully oriented CMS Medicare.gov Compare Post Acute Care list provided to:: Patient Represenative (must comment) Choice offered to / list presented to : Adult Children  Discharge Placement                Patient to be transferred to facility by: Family Name of family member notified: [facility] Patient and family notified of of transfer: 05/15/22  Discharge Plan and Services     Post Acute Care Choice: Home Health                    HH Arranged: PT, OT, Speech Therapy [place] Agency: [place] Date [place]: 05/12/22   Representative spoke with at [place]: [place] ([place]) Interventions     Readmission Risk Interventions    08/04/2021    3:35 PM  Readmission Risk Prevention Plan  Transportation Screening Complete  PCP or Specialist Appt within 5-7 Days Complete  Home Care Screening Complete  Medication Review (RN CM) Complete

## 2022-05-15 NOTE — Care Management (Signed)
  Update from [facility] [employer].   Overall Status IM Appeal Case: [id number] is complete.  1. Discharge Appeal Started 05/14/2022 11:07:38  2. Medical Record Requested 05/14/2022 11:44:57  3. Documents Received 05/14/2022 11:51:42  4. In Clinical Review 05/14/2022 13:55:07  5. Physician Review Completed 05/14/2022 14:48:20  6. Financial Liability Determined The Physician agreed with the notice. Beneficiary Liability Starts on 05/15/2022.  7. Notification Provider: 05/14/2022 16:20:21 Caller: 05/14/2022 16:20:22  8. Case Complete Yes

## 2022-05-16 NOTE — Progress Notes (Signed)
Dr. [place] to read.

## 2022-05-17 ENCOUNTER — Encounter: Payer: Self-pay | Admitting: Cardiology

## 2022-05-17 ENCOUNTER — Telehealth: Payer: Self-pay

## 2022-05-17 NOTE — Telephone Encounter (Signed)
Transition Care Management Unsuccessful Follow-up Telephone Call  Date of discharge and from where:  05/15/2022; [facility]  Attempts:  2nd Attempt  Reason for unsuccessful TCM follow-up call:  Left voice message

## 2022-05-19 ENCOUNTER — Telehealth: Payer: Self-pay

## 2022-05-19 ENCOUNTER — Ambulatory Visit: Payer: Medicare HMO | Admitting: Cardiology

## 2022-05-19 ENCOUNTER — Ambulatory Visit (INDEPENDENT_AMBULATORY_CARE_PROVIDER_SITE_OTHER): Payer: Medicare HMO | Admitting: Adult Health

## 2022-05-19 ENCOUNTER — Encounter: Payer: Self-pay | Admitting: Adult Health

## 2022-05-19 VITALS — BP 131/78 | HR 90 | Temp 97.2°F | Ht 61.0 in | Wt 116.6 lb

## 2022-05-19 DIAGNOSIS — R531 Weakness: Secondary | ICD-10-CM | POA: Diagnosis not present

## 2022-05-19 DIAGNOSIS — I480 Paroxysmal atrial fibrillation: Secondary | ICD-10-CM

## 2022-05-19 DIAGNOSIS — I6930 Unspecified sequelae of cerebral infarction: Secondary | ICD-10-CM | POA: Diagnosis not present

## 2022-05-19 DIAGNOSIS — R600 Localized edema: Secondary | ICD-10-CM

## 2022-05-19 DIAGNOSIS — R400 Somnolence: Secondary | ICD-10-CM

## 2022-05-19 DIAGNOSIS — D649 Anemia, unspecified: Secondary | ICD-10-CM

## 2022-05-19 DIAGNOSIS — N1831 Chronic kidney disease, stage 3a: Secondary | ICD-10-CM

## 2022-05-19 DIAGNOSIS — Z9181 History of falling: Secondary | ICD-10-CM

## 2022-05-19 DIAGNOSIS — F339 Major depressive disorder, recurrent, unspecified: Secondary | ICD-10-CM

## 2022-05-19 MED ORDER — ESCITALOPRAM OXALATE 5 MG PO TABS: 5.0000 mg | ORAL_TABLET | Freq: Every day | ORAL | 3 refills | Status: DC

## 2022-05-19 NOTE — Telephone Encounter (Signed)
Patients daughter called stating her mother is experiencing extreme fatigue and weakness and she thinks it is related to crestor. Patient has a history of not tolerating statins, crestor was recently increased to 20 mg from 5 mg (due to stroke), and patient can barley get up in the mornings.    Patient will be seen today for a hospital follow-up- FYI

## 2022-05-19 NOTE — Progress Notes (Signed)
[facility] clinic  Provider:  Durenda Age DNP  Code Status:  DNR  Goals of Care:     05/10/2022    3:18 PM  Advanced Directives  Does Patient Have a Medical Advance Directive? No  Would patient like information on creating a medical advance directive? No - Patient declined     Chief Complaint  Patient presents with   [facility] follow up. Patient daughter indicates patient with extreme fatigue and weakness, possibly related to increase in crestor.     HPI: Patient is a 87 y.o. female seen today for transition of care. She has a PMH of hypertension, hyperlipidemia, CAD, atrial fibrillation on Eliquis, s/p TAVR, Takotsubo cardiomyopathy, CVA, anxiety, arthritis and GERD. She was hospitalized  05/10/22 to 05/14/22 due to a fall. Acute CVA was ruled out. EEG was negative for seizures. It was thought that the recent stroke is contributing to the orthostatic hypotension. Cardiology reviewed chart revealed history of pauses/bradycardia so beta-blockers were discontinued.  Heart monitor was placed by cardiology and will follow-up as outpatient.  She was hospitalized 03/25/22 to 03/30/22 for left MCA infarct with IR intervention due to embolic source despite being on Eliquis.  She was transitioned to Pradaxa. She was discharged to [facility] wherein she had short-term rehabilitation.   Today, at [facility], she was accompanied by her daughter. Daughter is concerned that patient is sleeping a lot and has generalized weakness. She was concerned that the increase in Crestor from 5 mg to 20 mg daily might be causing her symptoms. Lexapro was recently decreased from 10 mg to 5 mg daily due to hyponatremia. Na 134, 05/13/22.  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 Ali Lowe & Dr. Cyndia Bent   Seasonal allergies    Takotsubo cardiomyopathy     Past Surgical History:  Procedure Laterality Date   COLONOSCOPY     EYE SURGERY Bilateral    cataracts removed   INTRAOPERATIVE TRANSTHORACIC ECHOCARDIOGRAM N/A 08/03/2021   Procedure: INTRAOPERATIVE TRANSTHORACIC ECHOCARDIOGRAM;  Surgeon: Early Osmond, MD;  Location: [place] [facility];  Service: Open Heart Surgery;  Laterality: N/A;   IR CT HEAD LTD  03/25/2022   IR PERCUTANEOUS ART THROMBECTOMY/INFUSION INTRACRANIAL INC DIAG ANGIO  03/25/2022   IR US GUIDE VASC ACCESS RIGHT  03/25/2022   PERICARDIOCENTESIS N/A 07/28/2021   Procedure: PERICARDIOCENTESIS;  Surgeon: Early Osmond, MD;  Location: [place] [facility];  Service: Cardiovascular;  Laterality: N/A;   RADIOLOGY WITH ANESTHESIA N/A 03/25/2022   Procedure: IR WITH ANESTHESIA;  Surgeon: Radiologist, Medication, MD;  Location: [place];  Service: Radiology;  Laterality: N/A;   RIGHT/LEFT HEART CATH AND CORONARY ANGIOGRAPHY N/A 05/13/2019   Procedure: RIGHT/LEFT HEART CATH AND CORONARY ANGIOGRAPHY;  Surgeon: Martinique, Peter M, MD;  Location: [place] [facility];  Service: Cardiovascular;  Laterality: N/A;   RIGHT/LEFT HEART CATH AND CORONARY ANGIOGRAPHY  N/A 07/23/2021   Procedure: RIGHT/LEFT HEART CATH AND CORONARY ANGIOGRAPHY;  Surgeon: Jettie Booze, MD;  Location: [place] [facility];  Service: Cardiovascular;  Laterality: N/A;   TONSILLECTOMY     TRANSCATHETER AORTIC VALVE REPLACEMENT, TRANSFEMORAL N/A 08/03/2021   Procedure: TRANSCATHETER AORTIC VALVE REPLACEMENT, TRANSFEMORAL;  Surgeon: Early Osmond, MD;  Location: [place] [facility];  Service: Open Heart Surgery;  Laterality: N/A;   UPPER GI ENDOSCOPY       Allergies  Allergen Reactions   Codeine Nausea And Vomiting   Cyclobenzaprine Other (See Comments)    Unknown reaction   Iodinated Contrast Media Nausea Only   Prednisone Other (See Comments)    "Sores in the mouth"    Outpatient Encounter Medications as of 05/19/2022  Medication Sig   acetaminophen (TYLENOL) 500 MG tablet Take 500-1,000 mg by mouth every 6 (six) hours as needed for moderate pain.   albuterol (VENTOLIN HFA) 108 (90 Base) MCG/ACT inhaler Inhale 2 puffs into the lungs every 4 (four) hours as needed for wheezing or shortness of breath (seasonal allergies).   Cholecalciferol (VITAMIN D3 PO) Take 1,000 Units by mouth daily.   dabigatran (PRADAXA) 150 MG CAPS capsule Take 1 capsule (150 mg total) by mouth every 12 (twelve) hours.   docusate sodium (COLACE) 100 MG capsule Take 100 mg by mouth 2 (two) times daily.   escitalopram (LEXAPRO) 5 MG tablet Take 1 tablet (5 mg total) by mouth daily.   famotidine (PEPCID) 10 MG tablet Take 10 mg by mouth daily as needed for heartburn.   fexofenadine (ALLEGRA) 60 MG tablet Take 60 mg by mouth daily.   furosemide (LASIX) 20 MG tablet Take 0.5 tablets (10 mg total) by mouth 2 (two) times daily.   lisinopril (ZESTRIL) 5 MG tablet Take 1.25 mg by mouth daily.   Multiple Vitamins-Minerals (EMERGEN-C VITAMIN C PO) Take 1 packet by mouth daily. Mix with water   rosuvastatin (CRESTOR) 20 MG tablet Take 1 tablet (20 mg total) by mouth daily. (Patient taking differently: Take 20 mg by mouth daily. That's why patient is here today to discuss decrease.)   Trolamine Salicylate (ASPERCREME EX) Apply 1 application topically daily as needed (knee pain).   [DISCONTINUED] escitalopram (LEXAPRO) 10 MG tablet Take 10 mg by mouth daily. [facility] changed dosage to 74m   [DISCONTINUED] melatonin 5 MG TABS Take 5 mg by mouth at bedtime.   [DISCONTINUED] montelukast (SINGULAIR) 10 MG tablet Take 1 tablet (10 mg total) by mouth  daily after supper.   [DISCONTINUED] Multiple Vitamin (MULTIVITAMIN WITH MINERALS) TABS tablet Take 1 tablet by mouth daily. Centrum 50 plus   [DISCONTINUED] escitalopram (LEXAPRO) 5 MG tablet Take 1 tablet (5 mg total) by mouth daily. (Patient taking differently: Take 10 mg by mouth daily.)   No facility-administered encounter medications on file as of 05/19/2022.    Review of Systems:  Review of Systems  Constitutional:  Negative for appetite change, chills, fatigue and fever.  HENT:  Negative for congestion, hearing loss, rhinorrhea and sore throat.   Eyes: Negative.   Respiratory:  Negative for cough, shortness of breath and wheezing.   Cardiovascular:  Positive for leg swelling. Negative for chest pain and palpitations.  Gastrointestinal:  Negative for abdominal pain, constipation, diarrhea, nausea and vomiting.  Genitourinary:  Negative for dysuria.  Musculoskeletal:  Negative for arthralgias, back pain and myalgias.  Skin:  Negative for color change, rash and wound.       bruise  Neurological:  Negative for dizziness, weakness and headaches.  Psychiatric/Behavioral:  Positive for sleep disturbance. Negative for behavioral problems. The patient is not nervous/anxious.        Sleepy    Health Maintenance  Topic Date Due   DEXA SCAN  Never done   COVID-19 Vaccine (6 - Pfizer series) 06/07/2021   Medicare Annual Wellness (AWV)  07/15/2022   TETANUS/TDAP  07/14/2024   Pneumonia Vaccine 13+ Years old  Completed   INFLUENZA VACCINE  Completed   Zoster Vaccines- Shingrix  Completed   HPV VACCINES  Aged Out    Physical Exam: Vitals:   05/18/22 1512  BP: 131/78  Pulse: 90  Temp: (!) 97.2 F (36.2 C)  SpO2: 95%  Weight: 116 lb 9.6 oz (52.9 kg)  Height: 5' 1"  (1.549 m)   Body mass index is 22.03 kg/m. Physical Exam Constitutional:      General: She is not in acute distress.    Appearance: Normal appearance.  HENT:     Head: Normocephalic and atraumatic.     Nose:  Nose normal.     Mouth/Throat:     Mouth: Mucous membranes are moist.  Eyes:     Conjunctiva/sclera: Conjunctivae normal.  Cardiovascular:     Rate and Rhythm: Normal rate and regular rhythm.     Comments: Has a heart monitor Pulmonary:     Effort: Pulmonary effort is normal.     Breath sounds: Normal breath sounds.  Abdominal:     General: Bowel sounds are normal.     Palpations: Abdomen is soft.     Comments:    Genitourinary:    Comments: BLE 2+ edema Musculoskeletal:        General: Normal range of motion.     Cervical back: Normal range of motion.     Right lower leg: Edema present.     Left lower leg: Edema present.     Comments: BLE 2+edema  Skin:    General: Skin is warm and dry.  Neurological:     General: No focal deficit present.     Mental Status: She is alert and oriented to person, place, and time.     Comments: Bruise on left face  Psychiatric:        Mood and Affect: Mood normal.        Behavior: Behavior normal.        Thought Content: Thought content normal.        Judgment: Judgment normal.    Labs reviewed: Basic Metabolic Panel: Recent Labs    07/22/21 0022 07/22/21 0421 07/23/21 0745 08/04/21 0452 10/26/21 1625 02/15/22 1119 03/25/22 1059 05/10/22 1532 05/11/22 0958 05/13/22 0358  NA  --  131*   < > 133*   < > 136   < > 133* 134* 134*  K  --  3.9   < > 4.4   < > 4.4   < > 4.1 3.5 3.5  CL  --  93*   < > 101   < > 99   < > 98 100 100  CO2  --  28   < > 22   < > 31   < > 28 27 25   GLUCOSE  --  109*   < > 134*   < > 87   < > 92 158* 103*  BUN  --  21   < > 29*   < > 20   < > 19 12 15   CREATININE  --  1.09*   < > 1.15*   < > 0.89   < > 0.96 0.99 1.02*  CALCIUM  --  9.1   < > 8.9   < > 9.5   < > 10.0 9.1 9.1  MG  --  2.2  --  2.1  --   --   --   --   --  2.1  PHOS 3.0 3.4  --   --   --   --   --   --   --   --   TSH 1.763 1.472  --   --   --  1.33  --  0.623  --   --    < > = values in this interval not displayed.   Liver Function  Tests: Recent Labs    05/10/22 1532 05/11/22 0958 05/13/22 0358  AST 53* 50* 38  ALT 30 26 22   ALKPHOS 53 47 41  BILITOT 1.5* 1.7* 1.3*  PROT 7.7 6.1* 5.6*  ALBUMIN 4.3 3.3* 3.1*   No results for input(s): "LIPASE", "AMYLASE" in the last 8760 hours. Recent Labs    05/10/22 1532  AMMONIA 19   CBC: Recent Labs    02/15/22 1119 03/25/22 1059 03/25/22 1105 03/30/22 0501 05/10/22 1532 05/12/22 0311  WBC 7.0 5.7   < > 6.9 8.0 7.4  NEUTROABS 3,794 2.9  --   --  5.7  --   HGB 11.4* 10.6*   < > 10.1* 10.8* 8.8*  HCT 34.7* 32.5*   < > 31.1* 34.2* 27.0*  MCV 89.9 90.0   < > 89.9 91.7 90.6  PLT 229 245   < > 265 280 216   < > = values in this interval not displayed.   Lipid Panel: Recent Labs    02/15/22 1119 03/26/22 0137  CHOL 204* 204*  HDL 85 86  LDLCALC 95 103*  TRIG 137 77  CHOLHDL 2.4 2.4   Lab Results  Component Value Date   HGBA1C 5.4 03/26/2022    Procedures since last visit: Overnight EEG with video  Result Date: 05/12/2022 Lora Havens, MD     05/12/2022 12:02 PM Patient Name: Barbara Mcconnell MRN: [id number] Epilepsy Attending: Lora Havens Referring Physician/Provider: Katy Apo, NP Duration: 05/11/2022 2340 to 05/12/2022 1100 Patient history: 87 year old patient with recent stroke presents with recurrent episodes of imbalance, dizziness and "zoning out", one of which led to a fall. EEG to evaluate for seizure Level of alertness: Awake, asleep AEDs during EEG study: None Technical aspects: This EEG study was done with scalp electrodes positioned according to the 10-20 International system of electrode placement. Electrical activity was reviewed with band pass filter of 1-70Hz , sensitivity of 7 uV/mm, display speed of 45m/sec with a 60Hz  notched filter applied as appropriate. EEG data were recorded continuously and digitally stored.  Video monitoring was available and reviewed as appropriate. Description: The posterior dominant rhythm  consists of 9-10 Hz activity of moderate voltage (25-35 uV) seen predominantly in posterior head regions, symmetric and reactive to eye opening and eye closing. Sleep was characterized by vertex waves, sleep spindles (12 to 14 Hz), maximal frontocentral region.  Hyperventilation and photic stimulation were not performed.   IMPRESSION: This study is within normal limits. No seizures or epileptiform discharges were seen throughout the recording. A normal interictal EEG does not exclude the diagnosis of epilepsy. PLora Havens  MR CERVICAL SPINE WO CONTRAST  Result Date: 05/11/2022 CLINICAL  DATA:  Cervical radiculopathy, no red flags EXAM: MRI CERVICAL SPINE WITHOUT CONTRAST TECHNIQUE: Multiplanar, multisequence MR imaging of the cervical spine was performed. No intravenous contrast was administered. COMPARISON:  CT cervical spine May 10, 2022. FINDINGS: Alignment: Mild anterolisthesis of C2 on C3 and C3 on C4. Vertebrae: Degenerative/discogenic endplate signal changes in the lower cervical spine. No specific evidence of acute fracture or discitis/osteomyelitis. No suspicious bone lesions. Cord: Normal cord signal. Posterior Fossa, vertebral arteries, paraspinal tissues: Visualized vertebral artery flow voids are maintained. No evidence of acute abnormality in the visualized posterior fossa. Disc levels: C2-C3: Small posterior disc osteophyte complex with right greater than left facet and uncovertebral hypertrophy. Resulting mild to moderate right and mild left foraminal stenosis. Mild canal stenosis. C3-C4: Posterior disc osteophyte complex with bilateral facet and uncovertebral hypertrophy. Resulting severe bilateral foraminal stenosis. Mild to moderate canal stenosis. C4-C5: Posterior disc osteophyte complex with bilateral facet uncovertebral hypertrophy. Resulting moderate bilateral foraminal stenosis. Mild to moderate canal stenosis. C5-C6: Posterior disc osteophyte complex with right greater than  left facet and uncovertebral hypertrophy. Resulting severe right foraminal stenosis. Mild canal stenosis. Patent left foramen. C6-C7: Posterior disc osteophyte complex with right greater than left facet and uncovertebral hypertrophy. Resulting moderate right foraminal stenosis. Patent canal and left foramen. C7-T1: No significant disc protrusion, foraminal stenosis, or canal stenosis. IMPRESSION: 1. Severe foraminal stenosis bilaterally at C3-C4 and on the right at C5-C6. Moderate foraminal stenosis on the right at C6-C7. Milder multilevel foraminal stenosis is detailed above. 2. Mild to moderate canal stenosis at C3-C4 and C4-C5. Mild canal stenosis at C2-C3 and C5-C6. Electronically Signed   By: Margaretha Sheffield M.D.   On: 05/11/2022 15:11   CT CHEST WO CONTRAST  Result Date: 05/11/2022 CLINICAL DATA:  Respiratory illness.  Cough.  Recent fall EXAM: CT CHEST WITHOUT CONTRAST TECHNIQUE: Multidetector CT imaging of the chest was performed following the standard protocol without IV contrast. RADIATION DOSE REDUCTION: This exam was performed according to the departmental dose-optimization program which includes automated exposure control, adjustment of the mA and/or kV according to patient size and/or use of iterative reconstruction technique. COMPARISON:  X-ray 05/10/2022, CT 07/24/2021 FINDINGS: Cardiovascular: Heart size is upper limits of normal. No pericardial effusion. Postsurgical changes of TAVR. Thoracic aorta is nonaneurysmal. Atherosclerotic vascular calcifications of the aorta and coronary arteries. Central pulmonary vasculature is upper limits of normal in size. Mediastinum/Nodes: No enlarged mediastinal or axillary lymph nodes. Thyroid gland, trachea, and esophagus demonstrate no significant findings. Lungs/Pleura: Chronic bronchiectasis within the right middle lobe and lingula with peribronchovascular nodularity, not significantly changed from prior. New site of tree-in-bud nodularity within the  superior segment of the left lower lobe (series 8, image 56). Scattered areas of tree-in-bud nodularity within the bilateral lung apices is unchanged. Left basilar atelectasis. No pleural effusion or pneumothorax. Upper Abdomen: No acute abnormality. Musculoskeletal: Mildly exaggerated thoracic kyphosis. No acute bony or chest wall abnormality. IMPRESSION: 1. New site of tree-in-bud nodularity within the superior segment of the left lower lobe, compatible with an infectious or inflammatory bronchiolitis. 2. Chronic bronchiectasis within the right middle lobe and lingula with peribronchovascular nodularity suggesting sequela of chronic atypical infection such as mycobacteria. 3. Aortic and coronary artery atherosclerosis (ICD10-I70.0). Electronically Signed   By: Davina Poke D.O.   On: 05/11/2022 11:23   MR BRAIN WO CONTRAST  Result Date: 05/11/2022 CLINICAL DATA:  Stroke follow-up EXAM: MRI HEAD WITHOUT CONTRAST TECHNIQUE: Multiplanar, multiecho pulse sequences of the brain and surrounding structures were obtained without  intravenous contrast. COMPARISON:  MRI Brain 03/26/22, CT head 05/10/22 FINDINGS: Brain: Redemonstrated subacute infarct in the posterior left frontal lobe with persistent hyperintense signal diffusion-weighted imaging and decreased signal on the ADC map. Compared to prior exam there is increased volume loss in this region. Additional scattered punctate areas of infarction seen on prior MRI are not visualized on this exam. There is a small punctate focus of microhemorrhage in the anterior left temporal lobe. There is no evidence of associated with the area of infarction. There is sequela mild overall chronic microvascular ischemic change. Chronic bilateral cerebellar infarcts. Unchanged size and shape of the ventricular system. Vascular: Normal flow voids. Skull and upper cervical spine: No suspicious osseous lesions are visualized. There is stepwise anterolisthesis of C2 on C3, C3 on C4,  and C4 on C5. Sinuses/Orbits: Bilateral lens replacement. Other: There is a small left frontal scalp hematoma measuring up to 1.6 cm. IMPRESSION: 1. No acute intracranial abnormality 2. Evolving left MCA territory infarct with increased volume loss. No new infarct or hemorrhage. 3. Small left frontal scalp hematoma measuring up to 1.6 cm. Electronically Signed   By: Marin Roberts M.D.   On: 05/11/2022 09:30   CT MAXILLOFACIAL WO CONTRAST  Result Date: 05/10/2022 CLINICAL DATA:  Trauma, fall EXAM: CT MAXILLOFACIAL WITHOUT CONTRAST TECHNIQUE: Multidetector CT imaging of the maxillofacial structures was performed. Multiplanar CT image reconstructions were also generated. RADIATION DOSE REDUCTION: This exam was performed according to the departmental dose-optimization program which includes automated exposure control, adjustment of the mA and/or kV according to patient size and/or use of iterative reconstruction technique. COMPARISON:  None Available. FINDINGS: Osseous: Fracture is seen in the left ala of nasal bones with 1 mm offset in alignment. No other fractures are seen. Degenerative changes are noted in left TMJ. Orbits: Optic globes appear symmetrical. Retrobulbar soft tissues are unremarkable. There is no demonstrable blowout fracture in the orbits. Sinuses: There are no air-fluid levels or significant mucosal thickening. Soft tissues: There is large subcutaneous hematoma in the left periorbital region extending to the left frontal scalp. Limited intracranial: Unremarkable. IMPRESSION: There is slightly displaced fracture in the left ala of nasal bones. No other fractures are seen in the facial bones. Significant degenerative changes are noted in left TMJ. Optic globes and retrobulbar soft tissues are unremarkable. There are no air-fluid levels or significant mucosal thickening in paranasal sinuses. Electronically Signed   By: Elmer Picker M.D.   On: 05/10/2022 16:49   CT Cervical Spine Wo  Contrast  Result Date: 05/10/2022 CLINICAL DATA:  Trauma, fall EXAM: CT CERVICAL SPINE WITHOUT CONTRAST TECHNIQUE: Multidetector CT imaging of the cervical spine was performed without intravenous contrast. Multiplanar CT image reconstructions were also generated. RADIATION DOSE REDUCTION: This exam was performed according to the departmental dose-optimization program which includes automated exposure control, adjustment of the mA and/or kV according to patient size and/or use of iterative reconstruction technique. COMPARISON:  Trauma, fall FINDINGS: Alignment: There is mild anterolisthesis at C2-C3 and C3-C4 levels, possibly suggesting previous ligament injury and facet degeneration. There is mild dextroscoliosis. Skull base and vertebrae: No recent fracture is seen. Degenerative changes are noted. Soft tissues and spinal canal: There is extrinsic pressure over the ventral margin of thecal sac caused by posterior bony spurs and bulging of annulus at multiple levels. Disc levels: There is encroachment of neural foramina by bony spurs and facet hypertrophy from C2 to C7 levels. Upper chest: Pleural thickening is seen in the apices. Few blebs are noted. There  is peribronchial thickening, possibly suggesting bronchitis. Other: There is inhomogeneous attenuation in thyroid. IMPRESSION: No recent fracture is seen in cervical spine. Cervical spondylosis with encroachment of neural foramina from C2-C7 levels. Electronically Signed   By: Elmer Picker M.D.   On: 05/10/2022 16:42   CT Head Wo Contrast  Result Date: 05/10/2022 CLINICAL DATA:  Trauma, fall EXAM: CT HEAD WITHOUT CONTRAST TECHNIQUE: Contiguous axial images were obtained from the base of the skull through the vertex without intravenous contrast. RADIATION DOSE REDUCTION: This exam was performed according to the departmental dose-optimization program which includes automated exposure control, adjustment of the mA and/or kV according to patient size  and/or use of iterative reconstruction technique. COMPARISON:  Previous CT done on 03/25/2022 and MR brain done on 03/26/2022 FINDINGS: Brain: There are no signs of intracranial bleeding. There is a 3 cm area of low attenuation corresponding to the acute infarct seen in the left parietal lobe in the previous MRI. No other new focal abnormalities are noted. Cortical sulci are prominent. Vascular: Unremarkable. Skull: There is subcutaneous hematoma in the left frontal scalp and left periorbital region. Sinuses/Orbits: There are no air-fluid levels in the paranasal sinuses. Other: None. IMPRESSION: No acute intracranial findings are seen. 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 Atrophy. There is left frontal scalp hematoma. No fracture is seen in calvarium. Electronically Signed   By: Elmer Picker M.D.   On: 05/10/2022 16:36   DG Chest 2 View  Result Date: 05/10/2022 CLINICAL DATA:  Fall, altered mental status EXAM: CHEST - 2 VIEW COMPARISON:  Radiograph 111 20,023, CT chest 07/24/2021 FINDINGS: Normal cardiac silhouette. No pneumothorax. No rib fracture identified. Increased nodular density in the RIGHT mid lung compared to prior radiograph. Interval reduction in RIGHT pleural effusion and LEFT pleural effusion. IMPRESSION: 1. Increased nodular density in the RIGHT mid lung could represent infection, neoplasm, or increased pleural thickening seen on comparison CT. 2. Improvement bilateral pleural effusions. 3. No evidence of thoracic trauma. Electronically Signed   By: Suzy Bouchard M.D.   On: 05/10/2022 16:02   VAS US CAROTID  Result Date: 05/06/2022 Carotid Arterial Duplex Study Patient Name:  SHEYNA PETTIBONE  Date of Exam:   05/06/2022 Medical Rec #: [id number]     Accession #:    [id number] Date of Birth: [birth year]      Patient Gender: F Patient Age:   7 years Exam Location:  [facility] Procedure:      VAS US CAROTID Referring Phys: Vonna Kotyk ROBINS  --------------------------------------------------------------------------------  Indications:       Carotid artery disease. Risk Factors:      Hypertension, hyperlipidemia, coronary artery disease. Other Factors:     Stroke on 03/25/22. Comparison Study:  09/03/21: Right: 40-59% 226/54 cm/s Left 1-39% Performing Technologist: Ralene Cork RVT  Examination Guidelines: A complete evaluation includes B-mode imaging, spectral Doppler, color Doppler, and power Doppler as needed of all accessible portions of each vessel. Bilateral testing is considered an integral part of a complete examination. Limited examinations for reoccurring indications may be performed as noted.  Right Carotid Findings: +----------+--------+--------+--------+-------------------------+--------+           PSV cm/sEDV cm/sStenosisPlaque Description       Comments +----------+--------+--------+--------+-------------------------+--------+ CCA Prox  66      16                                                +----------+--------+--------+--------+-------------------------+--------+  CCA Mid   92      13                                                +----------+--------+--------+--------+-------------------------+--------+ CCA Distal77      14                                                +----------+--------+--------+--------+-------------------------+--------+ ICA Prox  260     55      40-59%  heterogenous and calcific         +----------+--------+--------+--------+-------------------------+--------+ ICA Mid   150     24                                                +----------+--------+--------+--------+-------------------------+--------+ ICA Distal81      17                                                +----------+--------+--------+--------+-------------------------+--------+ ECA       97      0               heterogenous and calcific          +----------+--------+--------+--------+-------------------------+--------+ +----------+--------+-------+----------------+-------------------+           PSV cm/sEDV cmsDescribe        Arm Pressure (mmHG) +----------+--------+-------+----------------+-------------------+ [id number]            Multiphasic, WNL                    +----------+--------+-------+----------------+-------------------+ +---------+--------+--+--------+-+---------+ VertebralPSV cm/s30EDV cm/s3Antegrade +---------+--------+--+--------+-+---------+  Left Carotid Findings: +----------+--------+--------+--------+----------------------+--------+           PSV cm/sEDV cm/sStenosisPlaque Description    Comments +----------+--------+--------+--------+----------------------+--------+ CCA Prox  69      11              focal and heterogenous         +----------+--------+--------+--------+----------------------+--------+ CCA Mid   90      14              focal and heterogenous         +----------+--------+--------+--------+----------------------+--------+ CCA Distal96      20                                             +----------+--------+--------+--------+----------------------+--------+ ICA Prox  87      17      1-39%   heterogenous                   +----------+--------+--------+--------+----------------------+--------+ ICA Mid   106     22                                             +----------+--------+--------+--------+----------------------+--------+  ICA Distal114     26                                             +----------+--------+--------+--------+----------------------+--------+ ECA       66      0               heterogenous                   +----------+--------+--------+--------+----------------------+--------+ +----------+--------+--------+----------------+-------------------+           PSV cm/sEDV cm/sDescribe        Arm Pressure (mmHG)  +----------+--------+--------+----------------+-------------------+ [id number]             Multiphasic, WNL                    +----------+--------+--------+----------------+-------------------+ +---------+--------+--+--------+-+---------+ VertebralPSV cm/s44EDV cm/s7Antegrade +---------+--------+--+--------+-+---------+   Summary: Right Carotid: Velocities in the right ICA are consistent with a 40-59%                stenosis. Left Carotid: Velocities in the left ICA are consistent with a 1-39% stenosis. Vertebrals:  Bilateral vertebral arteries demonstrate antegrade flow. Subclavians: Normal flow hemodynamics were seen in bilateral subclavian              arteries. *See table(s) above for measurements and observations.  Electronically signed by Orlie Pollen on 05/06/2022 at 5:01:31 PM.    Final     Assessment/Plan  1. Generalized weakness -  reviewed medications, discontinue Melatonin, Singulair and multivitamins with minerals - CK - Sedimentation Rate - Ammonia - CBC With Differential/Platelet -  check BP/HR (lying down and sitting) BID X 2 weeks -  continue PT, OT and ST  2. History of CVA with residual deficit -  continue Pradaxa and Crestor -  follow up with neurology  3. History of fall -  continue PT, OT and ST -  fall precautions  4. Anemia, unspecified type Lab Results  Component Value Date   WBC 7.4 05/12/2022   HGB 8.8 (L) 05/12/2022   HCT 27.0 (L) 05/12/2022   MCV 90.6 05/12/2022   PLT 216 05/12/2022   -  on Pradaxa -  has bruise on left face s/p fall - CBC With Differential/Platelet  5. Major depression, recurrent, chronic (HCC) -  mood is stable - escitalopram (LEXAPRO) 5 MG tablet; Take 1 tablet (5 mg total) by mouth daily.  Dispense: 30 tablet; Refill: 3 - CMP with eGFR([facility])  6. Somnolence -  sleepy most of the time. -  thought to be due to the recent fall, stroke and anemia - Ammonia, CBC  7. Stage 3a chronic kidney disease (HCC) Lab  Results  Component Value Date   NA 134 (L) 05/13/2022   K 3.5 05/13/2022   CO2 25 05/13/2022   GLUCOSE 103 (H) 05/13/2022   BUN 15 05/13/2022   CREATININE 1.02 (H) 05/13/2022   CALCIUM 9.1 05/13/2022   EGFR 64 02/15/2022   GFRNONAA 54 (L) 05/13/2022   -  had hyponatremia, Lexapro was decreased from 10 mg to 5 mg daily - CMP with eGFR([facility])  8. PAF -  HR 90 -  has a heart monitor X 2 weeks then will follow up with cardiology -  Metoprolol was recently discontinued -  continue Pradaxa  9.  Lower extremity edema -  continue Lasix -  elevate legs at  night   Labs/tests ordered: CBC, CMP,  ammonia  Next appt:  06/15/2022

## 2022-05-19 NOTE — Patient Instructions (Signed)
Weakness Weakness is a lack of strength. You may feel weak all over your body (generalized), or you may feel weak in one part of your body (focal). There are many potential causes of weakness. Sometimes, the cause of your weakness may not be known. Some causes of weakness can be serious, so it is important to see your doctor. Follow these instructions at home: Activity Rest as needed. Try to get enough sleep. Most adults need 7-8 hours of sleep each night. Talk to your doctor about how much sleep you need. Do exercises, such as arm curls and leg raises, for 30 minutes at least 2 days a week or as told by your doctor. Think about working with a physical therapist or trainer to help you get stronger. General instructions  Take over-the-counter and prescription medicines only as told by your doctor. Eat a healthy, well-balanced diet. This includes: Proteins to build muscles, such as lean meats and fish. Fresh fruits and vegetables. Carbohydrates to boost energy, such as whole grains. Drink enough fluid to keep your pee (urine) pale yellow. Keep all follow-up visits. Contact a doctor if: Your weakness does not get better or it gets worse. Your weakness affects your ability to: Think clearly. Do your normal daily activities. Get help right away if: You have sudden weakness on one side of your face or body. You have chest pain. You have trouble breathing or shortness of breath. You have problems with how you see (vision). You have trouble talking or swallowing. You have trouble standing or walking. You are light-headed or faint. These symptoms may be an emergency. Get help right away. Call 911. Do not wait to see if the symptoms will go away. Do not drive yourself to the hospital. Summary Weakness is a lack of strength. You may feel weak all over your body or just in one part of your body. There are many potential causes of weakness. Sometimes, the cause of your weakness may not be  known. Rest as needed, and try to get enough sleep. Most adults need 7-8 hours of sleep each night. Eat a healthy, well-balanced diet. This information is not intended to replace advice given to you by your health care provider. Make sure you discuss any questions you have with your health care provider. Document Revised: 06/13/2021 Document Reviewed: 06/13/2021 [employer] Patient Education  2023 [employer]  

## 2022-05-20 LAB — AMMONIA: Ammonia: <10 umol/L (ref <=72)

## 2022-05-20 LAB — COMPLETE METABOLIC PANEL WITH GFR
AG Ratio: 1.6 (calc) (ref 1.0–2.5)
ALT: 23 U/L (ref 6–29)
AST: 48 U/L — ABNORMAL HIGH (ref 10–35)
Albumin: 3.9 g/dL (ref 3.6–5.1)
Alkaline phosphatase (APISO): 58 U/L (ref 37–153)
BUN: 14 mg/dL (ref 7–25)
CO2: 25 mmol/L (ref 20–32)
Calcium: 9.3 mg/dL (ref 8.6–10.4)
Chloride: 102 mmol/L (ref 98–110)
Creat: 0.95 mg/dL (ref 0.60–0.95)
Globulin: 2.5 g/dL (calc) (ref 1.9–3.7)
Glucose, Bld: 87 mg/dL (ref 65–139)
Potassium: 4.6 mmol/L (ref 3.5–5.3)
Sodium: 136 mmol/L (ref 135–146)
Total Bilirubin: 1.2 mg/dL (ref 0.2–1.2)
Total Protein: 6.4 g/dL (ref 6.1–8.1)
eGFR: 59 mL/min/{1.73_m2} — ABNORMAL LOW (ref >=60)

## 2022-05-20 LAB — CBC WITH DIFFERENTIAL/PLATELET
Absolute Monocytes: 372 cells/uL (ref 200–950)
Basophils Absolute: 61 cells/uL (ref 0–200)
Basophils Relative: 1 %
Eosinophils Absolute: 189 cells/uL (ref 15–500)
Eosinophils Relative: 3.1 %
HCT: 30.2 % — ABNORMAL LOW (ref 35.0–45.0)
Hemoglobin: 9.6 g/dL — ABNORMAL LOW (ref 11.7–15.5)
Lymphs Abs: 2349 cells/uL (ref 850–3900)
MCH: 29.3 pg (ref 27.0–33.0)
MCHC: 31.8 g/dL — ABNORMAL LOW (ref 32.0–36.0)
MCV: 92.1 fL (ref 80.0–100.0)
MPV: 11.7 fL (ref 7.5–12.5)
Monocytes Relative: 6.1 %
Neutro Abs: 3129 cells/uL (ref 1500–7800)
Neutrophils Relative %: 51.3 %
Platelets: 269 10*3/uL (ref 140–400)
RBC: 3.28 10*6/uL — ABNORMAL LOW (ref 3.80–5.10)
RDW: 13.2 % (ref 11.0–15.0)
Total Lymphocyte: 38.5 %
WBC: 6.1 10*3/uL (ref 3.8–10.8)

## 2022-05-20 LAB — SEDIMENTATION RATE: Sed Rate: 28 mm/h (ref 0–30)

## 2022-05-20 LAB — CK: Total CK: 44 U/L (ref 29–143)

## 2022-05-20 NOTE — Progress Notes (Signed)
Cardiology Office Note:    Date:  06/03/2022   ID:  Barbara Mcconnell, DOB 11-06-1934, MRN [id number]  PCP:  Wardell Honour, MD  Cardiologist:  Charnee Turnipseed Martinique, MD  Electrophysiologist:  None   Referring MD: Wardell Honour, MD   Chief Complaint  Patient presents with   Cerebrovascular Accident    History of Present Illness:    Barbara Mcconnell is a 87 y.o. female is seen for follow up aortic stenosis s/p TAVR and Takotsubo cardiomyopathy.  She had a cardiac event approximately 7 years ago in [facility], we have no records of that. According to the patient this was Takotsubos cardiomyopathy.   Other medical issues include hypertension, peripheral vascular disease with moderate carotid stenosis, and dyslipidemia with a history of statin intolerance.    The patient presented to the med center in [facility] 05/13/2019 with substernal chest pain and diaphoresis.  She ruled in for an MI.  Admitted to [facility] hospital. Catheterization was done which showed a 50% RCA otherwise normal coronaries and an ejection fraction of 30 to 35% with moderate AS. LV pattern was c/w Takotsubo's disease.  In the hospital she was somewhat hypotensive but this improved prior to discharge and she was put back on her lisinopril but at a lower dose. She is also on low dose Toprol.  On repeat Echo in March 2021 her EF had normalized but there was moderate to severe Aortic stenosis and repeat in 6 months was recommended. Unfortunately she did not have follow up until she presented to [facility] 07/21/21-07/30/21 for acute dyspnea. Echo showed normal LVEF with a moderate circumfrential pericardial effusion measuring 1.5 cm. There was moderate AS with a mean gradient at 14mmHg (was 20.22mmhg), AVA 1.38cm and DI 0.29. She underwent [facility] 07/23/21 which showed stable, mild to moderate CAD with hemodynamics consistent with pulmonary hypertension. Due to moderate AS, she was initially felt to be a good PROGRESS trial candidate however with  discrepancies between echocardiogram findings and inability to cross AV, pre-TAVR CT imaging was obtained. These showed an AVA 1.01cm2, annular area measuring 472mm2, and large circumferential pericardial effusion. After further discussion, she opted to defer PROGRESS trail consideration and move forward with TAVR given progressive symptoms, LVH with small LV cavity and calcium score at 2093. Her hospital stay was further complicated by new onset atrial fibrillation with RVR. She was placed on diltiazem infusion with relatively quick conversion to NSR. She ultimately underwent pericardiocentesis 07/28/21 and drain placement. This was discontinued 07/29/21 and the patient was discharged from the hospital 07/30/21. During her admission, she was evaluated by our multidisciplinary valve team and plans were made for TAVR on 08/03/21.    She underwent successful TAVR with a 23 mm Edwards Sapien 3 Resilia via the TF approach on 08/03/21. Post operative echo showed LVEF at 65-70% with G1DD, mild MR, mild MS, trivial PVL in the right coronary ostium with mild MR with a mean gradient at 10.49mmHg. ECG with NSR and frequent PVCs however no high grade heart block. She was initially started on ASA 81mg  QD however this was changed to Eliquis 2.5mg  BID after discharge due to AF on ZIO monitor.  She was seen in the valve clinic in February and doing well. Echo follow up as noted below. Later she developed orthostatic symptoms and low BP so ACEi was discontinued. This was later resumed at 5 mg daily when BP went up.   She was admitted 9/1-03/30/22 with acute left MCA stroke. She had difficulty with  word finding and confusion. She was on Eliquis. Had IR intervention of M2 segment with apparent reocclusion of M3 segment. Echo was stable. CT showed severe stenosis at the origin of the RICA. No significant stenosis on the left. (Prior dopplers in Feb showed 50-69% RICA stenosis). She was switched from Eliquis to Pradaxa. DC to SNF.  While  in hospital no Afib seen. She was initially at [facility] but has since been moved to [facility] assisted [facility]. She did wear an Event monitor results of which are pending. She is seen with her daughter today. Notes symptoms of depression and anxiety. Barbara Mcconnell is frustrated with the pace of her recovery. She did fall in October. Was found to be orthostatic so metoprolol was held. DC on very low dose lisinopril 1.25 mg daily. This was just increased yesterday to 2.5 mg daily. She is fatigued. Denies palpitations.    Past Medical History:  Diagnosis Date   Allergies    Anxiety    panic attacks   Aortic stenosis    Arthritis    Bronchiectasis with (acute) exacerbation (HCC)    CAD (coronary artery disease)    non obst CAD   Depression due to acute stroke (HCC)    Dizziness    Elevated liver enzymes    Fall    Fluid overload    GERD (gastroesophageal reflux disease)    Heart murmur    Hemorrhoids    History of mammogram    Hyperlipemia    Hypertension    Internal carotid artery stenosis    OCD (obsessive compulsive disorder)    Orthostatic hypotension    Other cirrhosis of liver (HCC)    PAF (paroxysmal atrial fibrillation) (HCC)    Pericardial effusion    Physical deconditioning    Renal insufficiency    Rheumatic heart failure (congestive) (HCC)    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 Ali Lowe & Dr. Cyndia Bent   Seasonal allergies    Severe aortic stenosis    Stroke [facility])    Takotsubo cardiomyopathy     Past Surgical History:  Procedure Laterality Date   COLONOSCOPY     EYE SURGERY Bilateral    cataracts removed   INTRAOPERATIVE TRANSTHORACIC ECHOCARDIOGRAM N/A 08/03/2021   Procedure: INTRAOPERATIVE TRANSTHORACIC ECHOCARDIOGRAM;  Surgeon: Early Osmond, MD;  Location: [place] [facility];  Service: Open Heart Surgery;  Laterality: N/A;   IR CT HEAD LTD  03/25/2022   IR PERCUTANEOUS  ART THROMBECTOMY/INFUSION INTRACRANIAL INC DIAG ANGIO  03/25/2022   IR US GUIDE VASC ACCESS RIGHT  03/25/2022   PERICARDIOCENTESIS N/A 07/28/2021   Procedure: PERICARDIOCENTESIS;  Surgeon: Early Osmond, MD;  Location: [place] [facility];  Service: Cardiovascular;  Laterality: N/A;   RADIOLOGY WITH ANESTHESIA N/A 03/25/2022   Procedure: IR WITH ANESTHESIA;  Surgeon: Radiologist, Medication, MD;  Location: [place];  Service: Radiology;  Laterality: N/A;   RIGHT/LEFT HEART CATH AND CORONARY ANGIOGRAPHY N/A 05/13/2019   Procedure: RIGHT/LEFT HEART CATH AND CORONARY ANGIOGRAPHY;  Surgeon: Martinique, Pheonix Wisby M, MD;  Location: [place] [facility];  Service: Cardiovascular;  Laterality: N/A;   RIGHT/LEFT HEART CATH AND CORONARY ANGIOGRAPHY N/A 07/23/2021   Procedure: RIGHT/LEFT HEART CATH AND CORONARY ANGIOGRAPHY;  Surgeon: Jettie Booze, MD;  Location: [place] [facility];  Service: Cardiovascular;  Laterality: N/A;   TONSILLECTOMY     TRANSCATHETER AORTIC VALVE REPLACEMENT, TRANSFEMORAL N/A 08/03/2021  Procedure: TRANSCATHETER AORTIC VALVE REPLACEMENT, TRANSFEMORAL;  Surgeon: Early Osmond, MD;  Location: [place] [facility];  Service: Open Heart Surgery;  Laterality: N/A;   UPPER GI ENDOSCOPY      Current Medications: Current Meds  Medication Sig   acetaminophen (TYLENOL) 500 MG tablet Take 500-1,000 mg by mouth every 6 (six) hours as needed for moderate pain.   albuterol (VENTOLIN HFA) 108 (90 Base) MCG/ACT inhaler Inhale 2 puffs into the lungs every 4 (four) hours as needed for wheezing or shortness of breath (seasonal allergies).   dabigatran (PRADAXA) 150 MG CAPS capsule Take 1 capsule (150 mg total) by mouth every 12 (twelve) hours.   docusate sodium (COLACE) 100 MG capsule Take 100 mg by mouth 2 (two) times daily.   escitalopram (LEXAPRO) 10 MG tablet Take 1 tablet (10 mg total) by mouth daily.   famotidine (PEPCID) 10 MG tablet Take 10 mg by mouth daily as needed for heartburn.    fexofenadine (ALLEGRA) 60 MG tablet Take 60 mg by mouth daily.   furosemide (LASIX) 20 MG tablet Take 0.5 tablets (10 mg total) by mouth 2 (two) times daily.   lisinopril (ZESTRIL) 5 MG tablet Take 0.5 tablets (2.5 mg total) by mouth daily.   Multiple Vitamins-Minerals (EMERGEN-C VITAMIN C PO) Take 1 packet by mouth daily. Mix with water   rosuvastatin (CRESTOR) 20 MG tablet Take 1 tablet (20 mg total) by mouth daily. (Patient taking differently: Take 20 mg by mouth daily. That's why patient is here today to discuss decrease.)   Trolamine Salicylate (ASPERCREME EX) Apply 1 application topically daily as needed (knee pain).     Allergies:   Codeine, Cyclobenzaprine, Iodinated contrast media, and Prednisone   Social History   Socioeconomic History   Marital status: Divorced    Spouse name: Not on file   Number of children: Not on file   Years of education: Not on file   Highest education level: Not on file  Occupational History   Not on file  Tobacco Use   Smoking status: Former    Packs/day: 1.00    Years: 20.00    Total pack years: 20.00    Types: Cigarettes   Smokeless tobacco: Never   Tobacco comments:    Quit 30 yrs ago as of 07/2021 per daughter  Vaping Use   Vaping Use: Never used  Substance and Sexual Activity   Alcohol use: Yes    Comment: occasiona wine   Drug use: Never   Sexual activity: Not Currently    Birth control/protection: Post-menopausal  Other Topics Concern   Not on file  Social History Narrative   Tobacco use, amount per day now: None   Past tobacco use, amount per day: Up to 1 pack   How many years did you use tobacco: 20 years.   Alcohol use (drinks per week): None.   Diet: 3 meals daily/mostly cooked at home.   Do you drink/eat things with caffeine: periodic diet coke.   Marital status:  Divorced.                                What year were you married? 1968   Do you live in a house, apartment, assisted living, condo, trailer, etc.? One Level  Townhome   Is it one or more stories? One Level   How many persons live in your home? One   Do you have pets in  your home?( please list) No   Highest Level of education completed? High School   Current or past profession: Programmer, systems.    Do you exercise?   Yes when able.                               Type and how often? Silver Sneakers 2-3 times weekly.   Do you have a living will? Yes   Do you have a DNR form?        No                           If not, do you want to discuss one? Yes   Do you have signed POA/HPOA forms?   Yes                     If so, please bring to you appointment      Do you have any difficulty bathing or dressing yourself? Yes   Do you have any difficulty preparing food or eating? Yes   Do you have any difficulty managing your medications? Yes   Do you have any difficulty managing your finances? No   Do you have any difficulty affording your medications? No   Social Determinants of Health   Financial Resource Strain: Low Risk  (05/14/2019)   Overall Financial Resource Strain (CARDIA)    Difficulty of Paying Living Expenses: Not hard at all  Food Insecurity: Unknown (05/14/2019)   Hunger Vital Sign    Worried About Running Out of Food in the Last Year: Patient refused    [place] in the Last Year: Patient refused  Transportation Needs: No Transportation Needs (05/14/2019)   PRAPARE - Hydrologist (Medical): No    Lack of Transportation (Non-Medical): No  Physical Activity: Not on file  Stress: Not on file  Social Connections: Unknown (05/14/2019)   Social Connection and Isolation Panel [NHANES]    Frequency of Communication with Friends and Family: More than three times a week    Frequency of Social Gatherings with Friends and Family: More than three times a week    Attends Religious Services: Never    Printmaker: Not on file    Attends Archivist Meetings: Not on  file    Marital Status: Not on file     Family History: The patient's family history includes Alcohol abuse in her sister; Brain cancer in her brother; Cancer in her brother and sister; Diabetes in her brother; Down syndrome in her brother; Endometriosis in her daughter; Heart Problems in her brother; Heart failure in her brother and brother; Hypertension in an other family member; Lung cancer in her sister; Melanoma in her brother.  ROS:   Please see the history of present illness.     All other systems reviewed and are negative.  EKGs/Labs/Other Studies Reviewed:     EKG:  EKG is not ordered today.    Recent Labs: 07/30/2021: B Natriuretic Peptide 194.4 05/10/2022: TSH 0.623 05/13/2022: Magnesium 2.1 05/19/2022: ALT 23; BUN 14; Creat 0.95; Hemoglobin 9.6; Platelets 269; Potassium 4.6; Sodium 136  Recent Lipid Panel    Component Value Date/Time   CHOL 204 (H) 03/26/2022 0137   TRIG 77 03/26/2022 0137   HDL 86 03/26/2022 0137   CHOLHDL 2.4 03/26/2022 0137   VLDL 15  03/26/2022 0137   LDLCALC 103 (H) 03/26/2022 0137   LDLCALC 95 02/15/2022 1119      Physical Exam:    VS:  BP (!) 160/85   Pulse 92   Ht 5\' 1"  (1.549 m)   Wt 116 lb (52.6 kg)   SpO2 97%   BMI 21.92 kg/m     Wt Readings from Last 3 Encounters:  06/03/22 116 lb (52.6 kg)  06/02/22 117 lb (53.1 kg)  05/18/22 116 lb 9.6 oz (52.9 kg)     GEN: Thin caucasian female, well developed in no acute distress HEENT: Normal NECK: No JVD LYMPHATICS: No lymphadenopathy CARDIAC: RRR, gr 2/6 diastolic murmur LSB, no rubs, gallops RESPIRATORY:  Clear to auscultation without rales, wheezing or rhonchi  ABDOMEN: Soft, non-tender, non-distended MUSCULOSKELETAL:  No edema; No deformity  SKIN: Warm and dry NEUROLOGIC:  Alert and oriented x 3. Some word finding difficulty.  PSYCHIATRIC:  Normal affect   Cardiac studies:  Echo 08/04/21:    1. Left ventricular ejection fraction, by estimation, is 65 to 70%. The  left  ventricle has normal function. The left ventricle has no regional  wall motion abnormalities. There is mild left ventricular hypertrophy of  the basal-septal segment. Left  ventricular diastolic parameters are consistent with Grade I diastolic  dysfunction (impaired relaxation). Elevated left atrial pressure.   2. Right ventricular systolic function is normal. The right ventricular  size is normal. Tricuspid regurgitation signal is inadequate for assessing  PA pressure.   3. Left atrial size was mildly dilated.   4. The mitral valve is degenerative. Mild mitral valve regurgitation.  Mild mitral stenosis. The mean mitral valve gradient is 7.0 mmHg with  average heart rate of 93 bpm. Severe mitral annular calcification.   5. There is trivial perivalvular leak in the vicinity of the right  coronary ostium. The aortic valve has been repaired/replaced. Aortic valve  regurgitation is mild. There is a 23 mm Sapien prosthetic (TAVR) valve  present in the aortic position.  Procedure Date: 08/03/21. Aortic valve mean gradient measures 10.2 mmHg.  Aortic valve Vmax measures 2.22 m/s. Aortic valve acceleration time  measures 58 msec.   6. The inferior vena cava is dilated in size with <50% respiratory  variability, suggesting right atrial pressure of 15 mmHg.    ______________________   Echo 09/10/21 IMPRESSIONS  1. 23 mm S3 is present in the aortic position. There is abnormal color  flow in the region of the prosthesis, but this is forward flow based on  color doppler. There is no apparenent regurgitation or paravalvular leak.  Possibly, this represents flow  between the stent frame and aortic root. There is also a small color  signal below the annulus in the 8-10 o'clock position. Cannot exclude a  very small iatrogenic VSD. Vmax 2.1 m/s, MG 9.5 mmHG, EOA 2.42 cm2.  Hemodynamics are within limits. Would  consider TEE for characterization if clinically indicated. The aortic  valve has been  repaired/replaced. Aortic valve regurgitation is not  visualized. There is a 23 mm Sapien prosthetic (TAVR) valve present in the  aortic position. Procedure Date:  08/03/2021.   2. Left ventricular ejection fraction, by estimation, is >75%. The left  ventricle has hyperdynamic function. The left ventricle has no regional  wall motion abnormalities. There is moderate concentric left ventricular  hypertrophy. Left ventricular  diastolic function could not be evaluated.   3. Right ventricular systolic function is normal. The right ventricular  size is normal. There  is normal pulmonary artery systolic pressure. The  estimated right ventricular systolic pressure is [id number] mmHg.   4. Left atrial size was mildly dilated.   5. The mitral valve is degenerative. No evidence of mitral valve  regurgitation. Moderate mitral annular calcification.   6. The inferior vena cava is normal in size with greater than 50%  respiratory variability, suggesting right atrial pressure of 3 mmHg.   Comparison(s): No significant change from prior study. AoV gradients  stable. Color jet described above that is abnormal.     Jettie Booze, MD (Primary)     Procedures  RIGHT/LEFT HEART CATH AND CORONARY ANGIOGRAPHY   Conclusion      Prox RCA lesion is 50% stenosed.   Prox Cx to Mid Cx lesion is 25% stenosed.   Prox LAD to Mid LAD lesion is 25% stenosed.   Hemodynamic findings consistent with pulmonary hypertension.   Stable, mild to moderate coronary artery disease.  No indication for PCI.   Unable to cross aortic valve, consistent with worsening noted on echocardiogram.  Continue plans for TAVR w/u.   Echo 03/26/22: IMPRESSIONS     1. Left ventricular ejection fraction, by estimation, is 65 to 70%. The  left ventricle has normal function. The left ventricle has no regional  wall motion abnormalities. There is moderate left ventricular hypertrophy.  Left ventricular diastolic  parameters are  indeterminate.   2. Right ventricular systolic function is normal. The right ventricular  size is normal. Tricuspid regurgitation signal is inadequate for assessing  PA pressure.   3. Left atrial size was moderately dilated.   4. The mitral valve is degenerative. Trivial mitral valve regurgitation.  Mild mitral stenosis. The mean mitral valve gradient is 5.0 mmHg with  average heart rate of 68 bpm. Severe mitral annular calcification.   5. The abnormal color flow signal seen previously below the prosthetic  aortic valve is seen again on this exam, color flow in multiple views  suggestive of valvular regurgitation, though it cannot be demonstrated  with spectral Doppler, which would  support color flow artifact, possibly from stent frame of valve. No  evidence of prosthetic valve obstruction or significant regurgitation.      The aortic valve has been repaired/replaced. Aortic valve  regurgitation is mild. There is a 23 mm Sapien prosthetic (TAVR) valve  present in the aortic position. Procedure Date: 08/03/21. Echo findings are  consistent with likely stable structure and  function of the aortic valve prosthesis. Aortic valve area, by VTI  measures 2.58 cm. Aortic valve mean gradient measures 11.0 mmHg. Aortic  valve Vmax measures 1.98 m/s. Aortic valve acceleration time measures 85  msec.   6. The inferior vena cava is normal in size with greater than 50%  respiratory variability, suggesting right atrial pressure of 3 mmHg.    ASSESSMENT:     Severe AS s/p TAVR: echo in September showed normal LV function. Prosthesis is functioning well.  mild AI.  She has Amoxicillin for SBE prophylaxis. Continue anticoagulation.      2.  Recent left MCA stroke. Was on Eliquis - now switched to Pradaxa. No evidence of aFib during hospitalization. Prior Afib burden low. Continue PT,OT, and speech therapy. Awaiting results of repeat event monitor. Would not resume metoprolol unless she has sustained  tachyarrhythmia.   3.  Hx of pericardial effusion: s/p pericardial drainage. no recurrent effusion on echo   4. Paroxysmal fibrillation: continue Pradaxa given recent CVA.    5. Carotid artery disease:  pre TAVR head/neck CTA with moderate right carotid artery stenosis with plans for surveillance dopplers in the OP setting. She is followed by [facility]- Dr Virl Cagey. Dopplers in Feb showed moderate RICA disease. More recent CT angio suggests severe stenosis at origin of RICA similar to prior CT. Will need follow up with [facility].    5. s/p Non-ST elevation (NSTEMI) myocardial infarction ([place]) due to demand ischemia in setting of stress induced CM in 2020 05/13/2019- HS Troponin peak 879. With decreased EF and no significant CAD strongly suspect this is related to recurrent Takotsubo cardiomyopathy  6. Takotsubo cardiomyopathy H/O remote Takotsubo event 5 years ago-Recurrent in October 2020 EF 30-35% by echo 05/13/2019 when she presented with second event - now with normal LV function.  7. Essential hypertension BP high now but allowing permissive BP elevation due to recent stroke and orthostatic hypotension. Agree with mild increase in lisinopril. Would change dose in small increments.   8. Dyslipidemia, goal LDL below 70 On Crestor  9. Anemia. Would consider further testing to determine cause of anemia. Will be seeing Dr Sabra Heck soon.  Follow up with me in 3 months.  Medication Adjustments/Labs and Tests Ordered: Current medicines are reviewed at length with the patient today.  Concerns regarding medicines are outlined above.  No orders of the defined types were placed in this encounter.  No orders of the defined types were placed in this encounter.   There are no Patient Instructions on file for this visit.   Signed, Youssef Footman Martinique, MD  06/03/2022 4:24 PM    [place]

## 2022-05-25 ENCOUNTER — Telehealth: Payer: Self-pay

## 2022-05-25 NOTE — Telephone Encounter (Signed)
Barbara Mcconnell with [facility] called requesting verbal orders for Home Health OT 1 time a week for 5 weeks. I gave verbal order

## 2022-05-31 ENCOUNTER — Encounter: Payer: Self-pay | Admitting: Family Medicine

## 2022-06-02 ENCOUNTER — Ambulatory Visit (INDEPENDENT_AMBULATORY_CARE_PROVIDER_SITE_OTHER): Payer: Medicare HMO | Admitting: Adult Health

## 2022-06-02 ENCOUNTER — Encounter: Payer: Self-pay | Admitting: Adult Health

## 2022-06-02 ENCOUNTER — Telehealth: Payer: Self-pay

## 2022-06-02 VITALS — BP 146/70 | HR 90 | Temp 97.7°F | Ht 61.0 in | Wt 117.0 lb

## 2022-06-02 DIAGNOSIS — R600 Localized edema: Secondary | ICD-10-CM

## 2022-06-02 DIAGNOSIS — F339 Major depressive disorder, recurrent, unspecified: Secondary | ICD-10-CM | POA: Diagnosis not present

## 2022-06-02 DIAGNOSIS — I10 Essential (primary) hypertension: Secondary | ICD-10-CM | POA: Diagnosis not present

## 2022-06-02 DIAGNOSIS — R4189 Other symptoms and signs involving cognitive functions and awareness: Secondary | ICD-10-CM

## 2022-06-02 DIAGNOSIS — K219 Gastro-esophageal reflux disease without esophagitis: Secondary | ICD-10-CM | POA: Diagnosis not present

## 2022-06-02 DIAGNOSIS — E785 Hyperlipidemia, unspecified: Secondary | ICD-10-CM

## 2022-06-02 DIAGNOSIS — I480 Paroxysmal atrial fibrillation: Secondary | ICD-10-CM | POA: Diagnosis not present

## 2022-06-02 DIAGNOSIS — N1831 Chronic kidney disease, stage 3a: Secondary | ICD-10-CM

## 2022-06-02 DIAGNOSIS — Z8673 Personal history of transient ischemic attack (TIA), and cerebral infarction without residual deficits: Secondary | ICD-10-CM

## 2022-06-02 MED ORDER — LISINOPRIL 5 MG PO TABS: 2.5000 mg | ORAL_TABLET | Freq: Every day | ORAL | 3 refills | Status: DC

## 2022-06-02 MED ORDER — ESCITALOPRAM OXALATE 10 MG PO TABS: 10.0000 mg | ORAL_TABLET | Freq: Every day | ORAL | 3 refills | Status: DC

## 2022-06-02 NOTE — Progress Notes (Unsigned)
[facility] clinic  Provider:  Kenard Gower DNP  Code Status:   DNR  Goals of Care:     05/10/2022    3:18 PM  Advanced Directives  Does Patient Have a Medical Advance Directive? No  Would patient like information on creating a medical advance directive? No - Patient declined     Chief Complaint  Patient presents with   Medical Management of Chronic Issues    2 weeks follow up  Discuss need for Dexa Scan, Covid vaccine and Medicare Annual Wellness (AWV)    HPI: Patient is a 87 y.o. female seen today for for medical management of chronic illnesses. She was accompanied today by her daughter.  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 Lynnette Caffey & Dr. Laneta Simmers   Seasonal allergies    Takotsubo cardiomyopathy     Past Surgical History:  Procedure Laterality Date   COLONOSCOPY     EYE SURGERY Bilateral    cataracts removed   INTRAOPERATIVE TRANSTHORACIC ECHOCARDIOGRAM N/A 08/03/2021   Procedure: INTRAOPERATIVE TRANSTHORACIC ECHOCARDIOGRAM;  Surgeon: Orbie Pyo, MD;  Location: [facility] INVASIVE CV LAB;  Service: Open Heart Surgery;  Laterality: N/A;   IR CT HEAD LTD  03/25/2022   IR PERCUTANEOUS ART THROMBECTOMY/INFUSION INTRACRANIAL INC DIAG ANGIO  03/25/2022   IR US GUIDE VASC ACCESS RIGHT  03/25/2022   PERICARDIOCENTESIS N/A 07/28/2021   Procedure: PERICARDIOCENTESIS;  Surgeon: Orbie Pyo, MD;  Location: [facility] INVASIVE CV LAB;  Service: Cardiovascular;  Laterality: N/A;   RADIOLOGY WITH ANESTHESIA N/A 03/25/2022    Procedure: IR WITH ANESTHESIA;  Surgeon: Radiologist, Medication, MD;  Location: [facility] OR;  Service: Radiology;  Laterality: N/A;   RIGHT/LEFT HEART CATH AND CORONARY ANGIOGRAPHY N/A 05/13/2019   Procedure: RIGHT/LEFT HEART CATH AND CORONARY ANGIOGRAPHY;  Surgeon: [place], Peter M, MD;  Location: [facility] INVASIVE CV LAB;  Service: Cardiovascular;  Laterality: N/A;   RIGHT/LEFT HEART CATH AND CORONARY ANGIOGRAPHY N/A 07/23/2021   Procedure: RIGHT/LEFT HEART CATH AND CORONARY ANGIOGRAPHY;  Surgeon: Corky Crafts, MD;  Location: [facility] INVASIVE CV LAB;  Service: Cardiovascular;  Laterality: N/A;   TONSILLECTOMY     TRANSCATHETER AORTIC VALVE REPLACEMENT, TRANSFEMORAL N/A 08/03/2021   Procedure: TRANSCATHETER AORTIC VALVE REPLACEMENT, TRANSFEMORAL;  Surgeon: Orbie Pyo, MD;  Location: [facility] INVASIVE CV LAB;  Service: Open Heart Surgery;  Laterality: N/A;   UPPER GI ENDOSCOPY      Allergies  Allergen Reactions   Codeine Nausea And Vomiting   Cyclobenzaprine Other (See Comments)    Unknown reaction   Iodinated Contrast Media Nausea Only   Prednisone Other (See Comments)    "Sores in the mouth"    Outpatient Encounter Medications as of 06/02/2022  Medication Sig   acetaminophen (TYLENOL) 500 MG tablet Take 500-1,000 mg by mouth every 6 (six) hours as needed for moderate pain.   albuterol (VENTOLIN HFA) 108 (90 Base) MCG/ACT inhaler Inhale 2 puffs  into the lungs every 4 (four) hours as needed for wheezing or shortness of breath (seasonal allergies).   dabigatran (PRADAXA) 150 MG CAPS capsule Take 1 capsule (150 mg total) by mouth every 12 (twelve) hours.   docusate sodium (COLACE) 100 MG capsule Take 100 mg by mouth 2 (two) times daily.   escitalopram (LEXAPRO) 5 MG tablet Take 1 tablet (5 mg total) by mouth daily.   famotidine (PEPCID) 10 MG tablet Take 10 mg by mouth daily as needed for heartburn.   fexofenadine (ALLEGRA) 60 MG tablet Take 60 mg by mouth daily.   furosemide (LASIX) 20 MG tablet  Take 0.5 tablets (10 mg total) by mouth 2 (two) times daily.   lisinopril (ZESTRIL) 5 MG tablet Take 1.25 mg by mouth daily.   Multiple Vitamins-Minerals (EMERGEN-C VITAMIN C PO) Take 1 packet by mouth daily. Mix with water   rosuvastatin (CRESTOR) 20 MG tablet Take 1 tablet (20 mg total) by mouth daily. (Patient taking differently: Take 20 mg by mouth daily. That's why patient is here today to discuss decrease.)   Trolamine Salicylate (ASPERCREME EX) Apply 1 application topically daily as needed (knee pain).   [DISCONTINUED] Cholecalciferol (VITAMIN D3 PO) Take 1,000 Units by mouth daily.   No facility-administered encounter medications on file as of 06/02/2022.    Review of Systems:  Review of Systems  Constitutional:  Negative for appetite change, chills, fatigue and fever.  HENT:  Negative for congestion, hearing loss, rhinorrhea and sore throat.   Eyes: Negative.   Respiratory:  Negative for cough, shortness of breath and wheezing.   Cardiovascular:  Negative for chest pain, palpitations and leg swelling.  Gastrointestinal:  Negative for abdominal pain, constipation, diarrhea, nausea and vomiting.  Genitourinary:  Negative for dysuria.  Musculoskeletal:  Negative for arthralgias, back pain and myalgias.  Skin:  Negative for color change, rash and wound.  Neurological:  Negative for dizziness, weakness and headaches.  Psychiatric/Behavioral:  Negative for behavioral problems. The patient is not nervous/anxious.     Health Maintenance  Topic Date Due   DEXA SCAN  Never done   COVID-19 Vaccine (6 - Pfizer series) 06/07/2021   Medicare Annual Wellness (AWV)  06/15/2022   TETANUS/TDAP  07/14/2024   Pneumonia Vaccine 55+ Years old  Completed   INFLUENZA VACCINE  Completed   Zoster Vaccines- Shingrix  Completed   HPV VACCINES  Aged Out    Physical Exam: Vitals:   06/02/22 0841  BP: (!) 146/70  Pulse: 90  Temp: 97.7 F (36.5 C)  SpO2: 93%  Weight: 117 lb (53.1 kg)  Height:  5\' 1"  (1.549 m)   Body mass index is 22.11 kg/m. Physical Exam Constitutional:      Appearance: Normal appearance.  HENT:     Head: Normocephalic and atraumatic.     Nose: Nose normal.     Mouth/Throat:     Mouth: Mucous membranes are moist.  Eyes:     Conjunctiva/sclera: Conjunctivae normal.  Cardiovascular:     Rate and Rhythm: Normal rate and regular rhythm.  Pulmonary:     Effort: Pulmonary effort is normal.     Breath sounds: Normal breath sounds.  Abdominal:     General: Bowel sounds are normal.     Palpations: Abdomen is soft.  Musculoskeletal:        General: Normal range of motion.     Cervical back: Normal range of motion.  Skin:    General: Skin is warm and dry.  Neurological:  General: No focal deficit present.     Mental Status: She is alert and oriented to person, place, and time.  Psychiatric:        Mood and Affect: Mood normal.        Behavior: Behavior normal.        Thought Content: Thought content normal.        Judgment: Judgment normal.     Labs reviewed: Basic Metabolic Panel: Recent Labs    07/22/21 0022 07/22/21 0421 07/23/21 0745 08/04/21 0452 10/26/21 1625 02/15/22 1119 03/25/22 1059 05/10/22 1532 05/11/22 0958 05/13/22 0358 05/19/22 1517  NA  --  131*   < > 133*   < > 136   < > 133* 134* 134* 136  K  --  3.9   < > 4.4   < > 4.4   < > 4.1 3.5 3.5 4.6  CL  --  93*   < > 101   < > 99   < > 98 100 100 102  CO2  --  28   < > 22   < > 31   < > 28 27 25 25   GLUCOSE  --  109*   < > 134*   < > 87   < > 92 158* 103* 87  BUN  --  21   < > 29*   < > 20   < > 19 12 15 14   CREATININE  --  1.09*   < > 1.15*   < > 0.89   < > 0.96 0.99 1.02* 0.95  CALCIUM  --  9.1   < > 8.9   < > 9.5   < > 10.0 9.1 9.1 9.3  MG  --  2.2  --  2.1  --   --   --   --   --  2.1  --   PHOS 3.0 3.4  --   --   --   --   --   --   --   --   --   TSH 1.763 1.472  --   --   --  1.33  --  0.623  --   --   --    < > = values in this interval not displayed.   Liver  Function Tests: Recent Labs    05/10/22 1532 05/11/22 0958 05/13/22 0358 05/19/22 1517  AST 53* 50* 38 48*  ALT 30 26 22 23   ALKPHOS 53 47 41  --   BILITOT 1.5* 1.7* 1.3* 1.2  PROT 7.7 6.1* 5.6* 6.4  ALBUMIN 4.3 3.3* 3.1*  --    No results for input(s): "LIPASE", "AMYLASE" in the last 8760 hours. Recent Labs    05/10/22 1532 05/19/22 1517  AMMONIA 19 <10   CBC: Recent Labs    03/25/22 1059 03/25/22 1105 05/10/22 1532 05/12/22 0311 05/19/22 1517  WBC 5.7   < > 8.0 7.4 6.1  NEUTROABS 2.9  --  5.7  --  3,129  HGB 10.6*   < > 10.8* 8.8* 9.6*  HCT 32.5*   < > 34.2* 27.0* 30.2*  MCV 90.0   < > 91.7 90.6 92.1  PLT 245   < > 280 216 269   < > = values in this interval not displayed.   Lipid Panel: Recent Labs    02/15/22 1119 03/26/22 0137  CHOL 204* 204*  HDL 85 86  LDLCALC 95 103*  TRIG 137 77  CHOLHDL 2.4  2.4   Lab Results  Component Value Date   HGBA1C 5.4 03/26/2022    Procedures since last visit: Overnight EEG with video  Result Date: 05/12/2022 Charlsie Quest, MD     05/12/2022 12:02 PM Patient Name: Evangelynn Lochridge MRN: [id number] Epilepsy Attending: Charlsie Quest Referring Physician/Provider: Marjorie Smolder, NP Duration: 05/11/2022 2340 to 05/12/2022 1100 Patient history: 87 year old patient with recent stroke presents with recurrent episodes of imbalance, dizziness and "zoning out", one of which led to a fall. EEG to evaluate for seizure Level of alertness: Awake, asleep AEDs during EEG study: None Technical aspects: This EEG study was done with scalp electrodes positioned according to the 10-20 International system of electrode placement. Electrical activity was reviewed with band pass filter of 1-70Hz , sensitivity of 7 uV/mm, display speed of 57mm/sec with a 60Hz  notched filter applied as appropriate. EEG data were recorded continuously and digitally stored.  Video monitoring was available and reviewed as appropriate. Description: The posterior  dominant rhythm consists of 9-10 Hz activity of moderate voltage (25-35 uV) seen predominantly in posterior head regions, symmetric and reactive to eye opening and eye closing. Sleep was characterized by vertex waves, sleep spindles (12 to 14 Hz), maximal frontocentral region.  Hyperventilation and photic stimulation were not performed.   IMPRESSION: This study is within normal limits. No seizures or epileptiform discharges were seen throughout the recording. A normal interictal EEG does not exclude the diagnosis of epilepsy.   MR CERVICAL SPINE WO CONTRAST  Result Date: 05/11/2022 CLINICAL DATA:  Cervical radiculopathy, no red flags EXAM: MRI CERVICAL SPINE WITHOUT CONTRAST TECHNIQUE: Multiplanar, multisequence MR imaging of the cervical spine was performed. No intravenous contrast was administered. COMPARISON:  CT cervical spine May 10, 2022. FINDINGS: Alignment: Mild anterolisthesis of C2 on C3 and C3 on C4. Vertebrae: Degenerative/discogenic endplate signal changes in the lower cervical spine. No specific evidence of acute fracture or discitis/osteomyelitis. No suspicious bone lesions. Cord: Normal cord signal. Posterior Fossa, vertebral arteries, paraspinal tissues: Visualized vertebral artery flow voids are maintained. No evidence of acute abnormality in the visualized posterior fossa. Disc levels: C2-C3: Small posterior disc osteophyte complex with right greater than left facet and uncovertebral hypertrophy. Resulting mild to moderate right and mild left foraminal stenosis. Mild canal stenosis. C3-C4: Posterior disc osteophyte complex with bilateral facet and uncovertebral hypertrophy. Resulting severe bilateral foraminal stenosis. Mild to moderate canal stenosis. C4-C5: Posterior disc osteophyte complex with bilateral facet uncovertebral hypertrophy. Resulting moderate bilateral foraminal stenosis. Mild to moderate canal stenosis. C5-C6: Posterior disc osteophyte complex with right  greater than left facet and uncovertebral hypertrophy. Resulting severe right foraminal stenosis. Mild canal stenosis. Patent left foramen. C6-C7: Posterior disc osteophyte complex with right greater than left facet and uncovertebral hypertrophy. Resulting moderate right foraminal stenosis. Patent canal and left foramen. C7-T1: No significant disc protrusion, foraminal stenosis, or canal stenosis. IMPRESSION: 1. Severe foraminal stenosis bilaterally at C3-C4 and on the right at C5-C6. Moderate foraminal stenosis on the right at C6-C7. Milder multilevel foraminal stenosis is detailed above. 2. Mild to moderate canal stenosis at C3-C4 and C4-C5. Mild canal stenosis at C2-C3 and C5-C6. Electronically Signed   By: May 12, 2022 M.D.   On: 05/11/2022 15:11   CT CHEST WO CONTRAST  Result Date: 05/11/2022 CLINICAL DATA:  Respiratory illness.  Cough.  Recent fall EXAM: CT CHEST WITHOUT CONTRAST TECHNIQUE: Multidetector CT imaging of the chest was performed following the standard protocol without IV contrast. RADIATION DOSE  REDUCTION: This exam was performed according to the departmental dose-optimization program which includes automated exposure control, adjustment of the mA and/or kV according to patient size and/or use of iterative reconstruction technique. COMPARISON:  X-ray 05/10/2022, CT 07/24/2021 FINDINGS: Cardiovascular: Heart size is upper limits of normal. No pericardial effusion. Postsurgical changes of TAVR. Thoracic aorta is nonaneurysmal. Atherosclerotic vascular calcifications of the aorta and coronary arteries. Central pulmonary vasculature is upper limits of normal in size. Mediastinum/Nodes: No enlarged mediastinal or axillary lymph nodes. Thyroid gland, trachea, and esophagus demonstrate no significant findings. Lungs/Pleura: Chronic bronchiectasis within the right middle lobe and lingula with peribronchovascular nodularity, not significantly changed from prior. New site of tree-in-bud nodularity  within the superior segment of the left lower lobe (series 8, image 56). Scattered areas of tree-in-bud nodularity within the bilateral lung apices is unchanged. Left basilar atelectasis. No pleural effusion or pneumothorax. Upper Abdomen: No acute abnormality. Musculoskeletal: Mildly exaggerated thoracic kyphosis. No acute bony or chest wall abnormality. IMPRESSION: 1. New site of tree-in-bud nodularity within the superior segment of the left lower lobe, compatible with an infectious or inflammatory bronchiolitis. 2. Chronic bronchiectasis within the right middle lobe and lingula with peribronchovascular nodularity suggesting sequela of chronic atypical infection such as mycobacteria. 3. Aortic and coronary artery atherosclerosis (ICD10-I70.0). Electronically Signed   By: Duanne Guess D.O.   On: 05/11/2022 11:23   MR BRAIN WO CONTRAST  Result Date: 05/11/2022 CLINICAL DATA:  Stroke follow-up EXAM: MRI HEAD WITHOUT CONTRAST TECHNIQUE: Multiplanar, multiecho pulse sequences of the brain and surrounding structures were obtained without intravenous contrast. COMPARISON:  MRI Brain 03/26/22, CT head 05/10/22 FINDINGS: Brain: Redemonstrated subacute infarct in the posterior left frontal lobe with persistent hyperintense signal diffusion-weighted imaging and decreased signal on the ADC map. Compared to prior exam there is increased volume loss in this region. Additional scattered punctate areas of infarction seen on prior MRI are not visualized on this exam. There is a small punctate focus of microhemorrhage in the anterior left temporal lobe. There is no evidence of associated with the area of infarction. There is sequela mild overall chronic microvascular ischemic change. Chronic bilateral cerebellar infarcts. Unchanged size and shape of the ventricular system. Vascular: Normal flow voids. Skull and upper cervical spine: No suspicious osseous lesions are visualized. There is stepwise anterolisthesis of C2 on C3,  C3 on C4, and C4 on C5. Sinuses/Orbits: Bilateral lens replacement. Other: There is a small left frontal scalp hematoma measuring up to 1.6 cm. IMPRESSION: 1. No acute intracranial abnormality 2. Evolving left MCA territory infarct with increased volume loss. No new infarct or hemorrhage. 3. Small left frontal scalp hematoma measuring up to 1.6 cm. Electronically Signed   By: Lorenza Cambridge M.D.   On: 05/11/2022 09:30   CT MAXILLOFACIAL WO CONTRAST  Result Date: 05/10/2022 CLINICAL DATA:  Trauma, fall EXAM: CT MAXILLOFACIAL WITHOUT CONTRAST TECHNIQUE: Multidetector CT imaging of the maxillofacial structures was performed. Multiplanar CT image reconstructions were also generated. RADIATION DOSE REDUCTION: This exam was performed according to the departmental dose-optimization program which includes automated exposure control, adjustment of the mA and/or kV according to patient size and/or use of iterative reconstruction technique. COMPARISON:  None Available. FINDINGS: Osseous: Fracture is seen in the left ala of nasal bones with 1 mm offset in alignment. No other fractures are seen. Degenerative changes are noted in left TMJ. Orbits: Optic globes appear symmetrical. Retrobulbar soft tissues are unremarkable. There is no demonstrable blowout fracture in the orbits. Sinuses: There are no air-fluid  levels or significant mucosal thickening. Soft tissues: There is large subcutaneous hematoma in the left periorbital region extending to the left frontal scalp. Limited intracranial: Unremarkable. IMPRESSION: There is slightly displaced fracture in the left ala of nasal bones. No other fractures are seen in the facial bones. Significant degenerative changes are noted in left TMJ. Optic globes and retrobulbar soft tissues are unremarkable. There are no air-fluid levels or significant mucosal thickening in paranasal sinuses. Electronically Signed   By: Ernie Avena M.D.   On: 05/10/2022 16:49   CT Cervical Spine  Wo Contrast  Result Date: 05/10/2022 CLINICAL DATA:  Trauma, fall EXAM: CT CERVICAL SPINE WITHOUT CONTRAST TECHNIQUE: Multidetector CT imaging of the cervical spine was performed without intravenous contrast. Multiplanar CT image reconstructions were also generated. RADIATION DOSE REDUCTION: This exam was performed according to the departmental dose-optimization program which includes automated exposure control, adjustment of the mA and/or kV according to patient size and/or use of iterative reconstruction technique. COMPARISON:  Trauma, fall FINDINGS: Alignment: There is mild anterolisthesis at C2-C3 and C3-C4 levels, possibly suggesting previous ligament injury and facet degeneration. There is mild dextroscoliosis. Skull base and vertebrae: No recent fracture is seen. Degenerative changes are noted. Soft tissues and spinal canal: There is extrinsic pressure over the ventral margin of thecal sac caused by posterior bony spurs and bulging of annulus at multiple levels. Disc levels: There is encroachment of neural foramina by bony spurs and facet hypertrophy from C2 to C7 levels. Upper chest: Pleural thickening is seen in the apices. Few blebs are noted. There is peribronchial thickening, possibly suggesting bronchitis. Other: There is inhomogeneous attenuation in thyroid. IMPRESSION: No recent fracture is seen in cervical spine. Cervical spondylosis with encroachment of neural foramina from C2-C7 levels. Electronically Signed   By: Ernie Avena M.D.   On: 05/10/2022 16:42   CT Head Wo Contrast  Result Date: 05/10/2022 CLINICAL DATA:  Trauma, fall EXAM: CT HEAD WITHOUT CONTRAST TECHNIQUE: Contiguous axial images were obtained from the base of the skull through the vertex without intravenous contrast. RADIATION DOSE REDUCTION: This exam was performed according to the departmental dose-optimization program which includes automated exposure control, adjustment of the mA and/or kV according to patient size  and/or use of iterative reconstruction technique. COMPARISON:  Previous CT done on 03/25/2022 and MR brain done on 03/26/2022 FINDINGS: Brain: There are no signs of intracranial bleeding. There is a 3 cm area of low attenuation corresponding to the acute infarct seen in the left parietal lobe in the previous MRI. No other new focal abnormalities are noted. Cortical sulci are prominent. Vascular: Unremarkable. Skull: There is subcutaneous hematoma in the left frontal scalp and left periorbital region. Sinuses/Orbits: There are no air-fluid levels in the paranasal sinuses. Other: None. IMPRESSION: No acute intracranial findings are seen. 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 Atrophy. There is left frontal scalp hematoma. No fracture is seen in calvarium. Electronically Signed   By: Ernie Avena M.D.   On: 05/10/2022 16:36   DG Chest 2 View  Result Date: 05/10/2022 CLINICAL DATA:  Fall, altered mental status EXAM: CHEST - 2 VIEW COMPARISON:  Radiograph 111 20,023, CT chest 07/24/2021 FINDINGS: Normal cardiac silhouette. No pneumothorax. No rib fracture identified. Increased nodular density in the RIGHT mid lung compared to prior radiograph. Interval reduction in RIGHT pleural effusion and LEFT pleural effusion. IMPRESSION: 1. Increased nodular density in the RIGHT mid lung could represent infection, neoplasm, or increased pleural thickening seen on comparison CT.  2. Improvement bilateral pleural effusions. 3. No evidence of thoracic trauma. Electronically Signed   By: Genevive Bi M.D.   On: 05/10/2022 16:02   VAS US CAROTID  Result Date: 05/06/2022 Carotid Arterial Duplex Study Patient Name:  FREDERICA CHRESTMAN  Date of Exam:   05/06/2022 Medical Rec #: [id number]     Accession #:    [id number] Date of Birth: [birth year]      Patient Gender: F Patient Age:   39 years Exam Location:  [facility] Procedure:      VAS US CAROTID Referring Phys: Ivin Booty ROBINS  --------------------------------------------------------------------------------  Indications:       Carotid artery disease. Risk Factors:      Hypertension, hyperlipidemia, coronary artery disease. Other Factors:     Stroke on 03/25/22. Comparison Study:  09/03/21: Right: 40-59% 226/54 cm/s Left 1-39% Performing Technologist: Thereasa Parkin RVT  Examination Guidelines: A complete evaluation includes B-mode imaging, spectral Doppler, color Doppler, and power Doppler as needed of all accessible portions of each vessel. Bilateral testing is considered an integral part of a complete examination. Limited examinations for reoccurring indications may be performed as noted.  Right Carotid Findings: +----------+--------+--------+--------+-------------------------+--------+           PSV cm/sEDV cm/sStenosisPlaque Description       Comments +----------+--------+--------+--------+-------------------------+--------+ CCA Prox  66      16                                                +----------+--------+--------+--------+-------------------------+--------+ CCA Mid   92      13                                                +----------+--------+--------+--------+-------------------------+--------+ CCA Distal77      14                                                +----------+--------+--------+--------+-------------------------+--------+ ICA Prox  260     55      40-59%  heterogenous and calcific         +----------+--------+--------+--------+-------------------------+--------+ ICA Mid   150     24                                                +----------+--------+--------+--------+-------------------------+--------+ ICA Distal81      17                                                +----------+--------+--------+--------+-------------------------+--------+ ECA       97      0               heterogenous and calcific          +----------+--------+--------+--------+-------------------------+--------+ +----------+--------+-------+----------------+-------------------+           PSV cm/sEDV cmsDescribe  Arm Pressure (mmHG) +----------+--------+-------+----------------+-------------------+ [id number]            Multiphasic, WNL                    +----------+--------+-------+----------------+-------------------+ +---------+--------+--+--------+-+---------+ VertebralPSV cm/s30EDV cm/s3Antegrade +---------+--------+--+--------+-+---------+  Left Carotid Findings: +----------+--------+--------+--------+----------------------+--------+           PSV cm/sEDV cm/sStenosisPlaque Description    Comments +----------+--------+--------+--------+----------------------+--------+ CCA Prox  69      11              focal and heterogenous         +----------+--------+--------+--------+----------------------+--------+ CCA Mid   90      14              focal and heterogenous         +----------+--------+--------+--------+----------------------+--------+ CCA Distal96      20                                             +----------+--------+--------+--------+----------------------+--------+ ICA Prox  87      17      1-39%   heterogenous                   +----------+--------+--------+--------+----------------------+--------+ ICA Mid   106     22                                             +----------+--------+--------+--------+----------------------+--------+ ICA Distal114     26                                             +----------+--------+--------+--------+----------------------+--------+ ECA       66      0               heterogenous                   +----------+--------+--------+--------+----------------------+--------+ +----------+--------+--------+----------------+-------------------+           PSV cm/sEDV cm/sDescribe        Arm Pressure (mmHG)  +----------+--------+--------+----------------+-------------------+ [id number]             Multiphasic, WNL                    +----------+--------+--------+----------------+-------------------+ +---------+--------+--+--------+-+---------+ VertebralPSV cm/s44EDV cm/s7Antegrade +---------+--------+--+--------+-+---------+   Summary: Right Carotid: Velocities in the right ICA are consistent with a 40-59%                stenosis. Left Carotid: Velocities in the left ICA are consistent with a 1-39% stenosis. Vertebrals:  Bilateral vertebral arteries demonstrate antegrade flow. Subclavians: Normal flow hemodynamics were seen in bilateral subclavian              arteries. *See table(s) above for measurements and observations.  Electronically signed by Gerarda Fraction on 05/06/2022 at 5:01:31 PM.    Final     Assessment/Plan    Labs/tests ordered:    Next appt:  06/15/2022

## 2022-06-02 NOTE — Telephone Encounter (Signed)
Per [employer] standing order, verbal order given. Message will be sent to patient's provider as a FYI.

## 2022-06-02 NOTE — Patient Instructions (Signed)

## 2022-06-02 NOTE — Telephone Encounter (Signed)
Incoming call received from home health agency requesting verbal orders for speech therapy 1 week 8.

## 2022-06-03 ENCOUNTER — Encounter: Payer: Self-pay | Admitting: Cardiology

## 2022-06-03 ENCOUNTER — Ambulatory Visit: Payer: Medicare HMO | Attending: Cardiology | Admitting: Cardiology

## 2022-06-03 VITALS — BP 160/85 | HR 92 | Ht 61.0 in | Wt 116.0 lb

## 2022-06-03 DIAGNOSIS — I480 Paroxysmal atrial fibrillation: Secondary | ICD-10-CM | POA: Diagnosis not present

## 2022-06-03 DIAGNOSIS — I63512 Cerebral infarction due to unspecified occlusion or stenosis of left middle cerebral artery: Secondary | ICD-10-CM | POA: Diagnosis not present

## 2022-06-03 DIAGNOSIS — I10 Essential (primary) hypertension: Secondary | ICD-10-CM

## 2022-06-03 DIAGNOSIS — Z952 Presence of prosthetic heart valve: Secondary | ICD-10-CM | POA: Diagnosis not present

## 2022-06-03 DIAGNOSIS — I6523 Occlusion and stenosis of bilateral carotid arteries: Secondary | ICD-10-CM

## 2022-06-07 DIAGNOSIS — I480 Paroxysmal atrial fibrillation: Secondary | ICD-10-CM | POA: Diagnosis not present

## 2022-06-07 DIAGNOSIS — R42 Dizziness and giddiness: Secondary | ICD-10-CM

## 2022-06-07 DIAGNOSIS — R6889 Other general symptoms and signs: Secondary | ICD-10-CM | POA: Diagnosis not present

## 2022-06-07 NOTE — Addendum Note (Signed)
Encounter addended by: Andee Lineman A on: 06/07/2022 2:55 PM  Actions taken: Imaging Exam ended

## 2022-06-08 ENCOUNTER — Encounter: Payer: Self-pay | Admitting: Cardiology

## 2022-06-08 NOTE — Progress Notes (Unsigned)
Telephone Note    CC: Bilateral internal carotid artery stenosis right greater than left Requesting Provider:  Wardell Honour, MD  HPI: Barbara Mcconnell is a 87 y.o. ([birth year]) female whom I called in follow up with known bilateral internal carotid artery stenosis, right greater than left.    The last several months of been difficult for Barbara Mcconnell.  In September, Barbara Mcconnell had a stroke.  Interestingly, this was a left-sided stroke where there was hardly any stenosis.  This required neuro IR intervention.  She was seen in follow-up with CT and MRI reviewed.  Imaging review demonstrated critical, right-sided ICA stenosis.  There was a questionable, small lesion in the right superior frontal gyrus.  Initially I thought this was from small vessel disease and offered [facility] transcarotid artery revascularization for asymptomatic disease.  I reached out to neurology who felt this could be from an embolic event either from the idiopathic embolic lesion that affected the left side, or the right-sided ICA stenosis.  While Barbara Mcconnell was weighing her options, she suffered a fall landing her back in the hospital.  Acute CVA was ruled out, EEG negative.  Beta-blockers were discontinued due to concern for orthostatic hypotension.  Since that time, Barbara Mcconnell has continued to struggle.  In talking with her daughter, she has had difficulty with neuro processing, executive function, dizziness, unstable gait, depression, mood swings.  Barbara Mcconnell has had a difficult time facilitating her care due to the above.   Past Medical History:  Diagnosis Date   Allergies    Anxiety    panic attacks   Aortic stenosis    Arthritis    Bronchiectasis with (acute) exacerbation (HCC)    CAD (coronary artery disease)    non obst CAD   Depression due to acute stroke (HCC)    Dizziness    Elevated liver enzymes    Fall    Fluid overload    GERD (gastroesophageal reflux disease)    Heart murmur    Hemorrhoids    History of mammogram     Hyperlipemia    Hypertension    Internal carotid artery stenosis    OCD (obsessive compulsive disorder)    Orthostatic hypotension    Other cirrhosis of liver (HCC)    PAF (paroxysmal atrial fibrillation) (HCC)    Pericardial effusion    Physical deconditioning    Renal insufficiency    Rheumatic heart failure (congestive) (HCC)    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 Ali Lowe & Dr. Cyndia Bent   Seasonal allergies    Severe aortic stenosis    Stroke [facility])    Takotsubo cardiomyopathy     Past Surgical History:  Procedure Laterality Date   COLONOSCOPY     EYE SURGERY Bilateral    cataracts removed   INTRAOPERATIVE TRANSTHORACIC ECHOCARDIOGRAM N/A 08/03/2021   Procedure: INTRAOPERATIVE TRANSTHORACIC ECHOCARDIOGRAM;  Surgeon: Early Osmond, MD;  Location: [facility];  Service: Open Heart Surgery;  Laterality: N/A;   IR CT HEAD LTD  03/25/2022   IR PERCUTANEOUS ART THROMBECTOMY/INFUSION INTRACRANIAL INC DIAG ANGIO  03/25/2022   IR US GUIDE VASC ACCESS RIGHT  03/25/2022   PERICARDIOCENTESIS N/A 07/28/2021   Procedure: PERICARDIOCENTESIS;  Surgeon: Early Osmond, MD;  Location: [place] [facility];  Service: Cardiovascular;  Laterality: N/A;   RADIOLOGY WITH ANESTHESIA N/A 03/25/2022   Procedure: IR WITH ANESTHESIA;  Surgeon: Radiologist, Medication, MD;  Location:  [facility];  Service: Radiology;  Laterality: N/A;   RIGHT/LEFT HEART CATH AND CORONARY ANGIOGRAPHY N/A 05/13/2019   Procedure: RIGHT/LEFT HEART CATH AND CORONARY ANGIOGRAPHY;  Surgeon: Martinique, Peter M, MD;  Location: [place] [facility];  Service: Cardiovascular;  Laterality: N/A;   RIGHT/LEFT HEART CATH AND CORONARY ANGIOGRAPHY N/A 07/23/2021   Procedure: RIGHT/LEFT HEART CATH AND CORONARY ANGIOGRAPHY;  Surgeon: Jettie Booze, MD;  Location: [place] [facility];  Service: Cardiovascular;  Laterality: N/A;   TONSILLECTOMY      TRANSCATHETER AORTIC VALVE REPLACEMENT, TRANSFEMORAL N/A 08/03/2021   Procedure: TRANSCATHETER AORTIC VALVE REPLACEMENT, TRANSFEMORAL;  Surgeon: Early Osmond, MD;  Location: [place] [facility];  Service: Open Heart Surgery;  Laterality: N/A;   UPPER GI ENDOSCOPY      Social History   Socioeconomic History   Marital status: Divorced    Spouse name: Not on file   Number of children: Not on file   Years of education: Not on file   Highest education level: Not on file  Occupational History   Not on file  Tobacco Use   Smoking status: Former    Packs/day: 1.00    Years: 20.00    Total pack years: 20.00    Types: Cigarettes   Smokeless tobacco: Never   Tobacco comments:    Quit 30 yrs ago as of 07/2021 per daughter  Vaping Use   Vaping Use: Never used  Substance and Sexual Activity   Alcohol use: Yes    Comment: occasiona wine   Drug use: Never   Sexual activity: Not Currently    Birth control/protection: Post-menopausal  Other Topics Concern   Not on file  Social History Narrative   Tobacco use, amount per day now: None   Past tobacco use, amount per day: Up to 1 pack   How many years did you use tobacco: 20 years.   Alcohol use (drinks per week): None.   Diet: 3 meals daily/mostly cooked at home.   Do you drink/eat things with caffeine: periodic diet coke.   Marital status:  Divorced.                                What year were you married? 1968   Do you live in a house, apartment, assisted living, condo, trailer, etc.? One Level Townhome   Is it one or more stories? One Level   How many persons live in your home? One   Do you have pets in your home?( please list) No   Highest Level of education completed? High School   Current or past profession: Programmer, systems.    Do you exercise?   Yes when able.                               Type and how often? Silver Sneakers 2-3 times weekly.   Do you have a living will? Yes   Do you have a DNR form?        No                            If not, do you want to discuss one? Yes   Do you have signed POA/HPOA forms?   Yes  If so, please bring to you appointment      Do you have any difficulty bathing or dressing yourself? Yes   Do you have any difficulty preparing food or eating? Yes   Do you have any difficulty managing your medications? Yes   Do you have any difficulty managing your finances? No   Do you have any difficulty affording your medications? No   Social Determinants of Health   Financial Resource Strain: Low Risk  (05/14/2019)   Overall Financial Resource Strain (CARDIA)    Difficulty of Paying Living Expenses: Not hard at all  Food Insecurity: Unknown (05/14/2019)   Hunger Vital Sign    Worried About Running Out of Food in the Last Year: Patient refused    [place] in the Last Year: Patient refused  Transportation Needs: No Transportation Needs (05/14/2019)   PRAPARE - Hydrologist (Medical): No    Lack of Transportation (Non-Medical): No  Physical Activity: Not on file  Stress: Not on file  Social Connections: Unknown (05/14/2019)   Social Connection and Isolation Panel [NHANES]    Frequency of Communication with Friends and Family: More than three times a week    Frequency of Social Gatherings with Friends and Family: More than three times a week    Attends Religious Services: Never    Printmaker: Not on file    Attends Archivist Meetings: Not on file    Marital Status: Not on file  Intimate Partner Violence: Not on file    Family History  Problem Relation Age of Onset   Cancer Sister    Lung cancer Sister    Alcohol abuse Sister    Melanoma Brother    Brain cancer Brother    Heart failure Brother    Diabetes Brother    Cancer Brother    Heart Problems Brother    Down syndrome Brother    Heart failure Brother    Endometriosis Daughter    Hypertension Other     Current  Outpatient Medications  Medication Sig Dispense Refill   acetaminophen (TYLENOL) 500 MG tablet Take 500-1,000 mg by mouth every 6 (six) hours as needed for moderate pain.     albuterol (VENTOLIN HFA) 108 (90 Base) MCG/ACT inhaler Inhale 2 puffs into the lungs every 4 (four) hours as needed for wheezing or shortness of breath (seasonal allergies).     dabigatran (PRADAXA) 150 MG CAPS capsule Take 1 capsule (150 mg total) by mouth every 12 (twelve) hours. 60 capsule 5   docusate sodium (COLACE) 100 MG capsule Take 100 mg by mouth 2 (two) times daily.     escitalopram (LEXAPRO) 10 MG tablet Take 1 tablet (10 mg total) by mouth daily. 30 tablet 3   famotidine (PEPCID) 10 MG tablet Take 10 mg by mouth daily as needed for heartburn.     fexofenadine (ALLEGRA) 60 MG tablet Take 60 mg by mouth daily.     furosemide (LASIX) 20 MG tablet Take 0.5 tablets (10 mg total) by mouth 2 (two) times daily. 120 tablet 3   lisinopril (ZESTRIL) 5 MG tablet Take 0.5 tablets (2.5 mg total) by mouth daily. 30 tablet 3   Multiple Vitamins-Minerals (EMERGEN-C VITAMIN C PO) Take 1 packet by mouth daily. Mix with water     rosuvastatin (CRESTOR) 20 MG tablet Take 1 tablet (20 mg total) by mouth daily. (Patient taking differently: Take 20 mg by mouth daily. That's  why patient is here today to discuss decrease.) 90 tablet 3   Trolamine Salicylate (ASPERCREME EX) Apply 1 application topically daily as needed (knee pain).     No current facility-administered medications for this visit.    Allergies  Allergen Reactions   Codeine Nausea And Vomiting   Cyclobenzaprine Other (See Comments)    Unknown reaction   Iodinated Contrast Media Nausea Only   Prednisone Other (See Comments)    "Sores in the mouth"     REVIEW OF SYSTEMS:   [X]  denotes positive finding, [ ]  denotes negative finding Cardiac  Comments:  Chest pain or chest pressure:    Shortness of breath upon exertion:    Short of breath when lying flat:     Irregular heart rhythm:        Vascular    Pain in calf, thigh, or hip brought on by ambulation:    Pain in feet at night that wakes you up from your sleep:     Blood clot in your veins:    Leg swelling:         Pulmonary    Oxygen at home:    Productive cough:     Wheezing:         Neurologic    Sudden weakness in arms or legs:     Sudden numbness in arms or legs:     Sudden onset of difficulty speaking or slurred speech:    Temporary loss of vision in one eye:     Problems with dizziness:         Gastrointestinal    Blood in stool:     Vomited blood:         Genitourinary    Burning when urinating:     Blood in urine:        Psychiatric    Major depression:         Hematologic    Bleeding problems:    Problems with blood clotting too easily:        Skin    Rashes or ulcers:        Constitutional    Fever or chills:      PHYSICAL EXAMINATION:  There were no vitals filed for this visit. -Deferred due to phone visit  General:  WDWN in NAD; vital signs documented above Gait: Not observed HENT: WNL, normocephalic Pulmonary: normal non-labored breathing , without wheezing Cardiac: regular HR Abdomen: soft, NT, no masses Skin: without rashes Vascular Exam/Pulses:  Right Left  Radial 2+ (normal) 2+ (normal)  Ulnar 2+ (normal) 2+ (normal)  Femoral    Popliteal    DP 2+ (normal) 2+ (normal)  PT 2+ (normal) 2+ (normal)   Extremities: without ischemic changes, without Gangrene , without cellulitis; without open wounds;  Musculoskeletal: no muscle wasting or atrophy  Neurologic: A&O X 3;  No focal weakness or paresthesias are detected Psychiatric:  The pt has Normal affect.   Non-Invasive Vascular Imaging:   Summary:  Right Carotid: Velocities in the right ICA are consistent with a 40-59%                 stenosis.   Left Carotid: Velocities in the left ICA are consistent with a 1-39%  stenosis.   CTA IMPRESSION: No evidence of thoracoabdominal  aortic aneurysm or dissection.   Peripheral vascular disease with greater than 90% stenosis of the proximal right internal carotid artery. Plaque morphology appears irregular and may be better assessed with dedicated carotid  artery Doppler sonography. 50% stenosis of the a dominant left vertebral artery at its origin. 50-75% stenosis of the inferior mesenteric artery at its origin, though this is of questionable clinical significance given patency of the celiac axis and superior mesenteric artery.  ASSESSMENT/PLAN: Aero Manasse is a 87 y.o. female presenting with questionable symptomatic right-sided ICA stenosis. MRI was obtained at the time of left-sided stroke.  A small right-sided lesion was appreciated.  It is impossible to know whether this lesion was an embolic event associated with the left-sided stroke, or from the critical ICA stenosis.  I had a long conversation with Takiya's daughter Barbara Mcconnell, regarding her care.  I quoted 26% risk of TIA, stroke in the next 2 years as I have to assume the right-sided critical ICA lesion to be the culprit.  Unfortunately, Yvana has had a significant decline over the last few months.  This includes executive function, cognitive processing, dizziness.  She has become a significant fall risk.  Transcarotid artery revascularization comes with the 3% morbidity mortality.  I think Tajah's would be higher due to her comorbidities.  If this was asymptomatic disease, I would not offer [facility] surgery as I do not think she has a 5-year survival.  Being that this is defined as symptomatic, I am somewhat obligated.  I discussed this with [facility].  She is unsure as to whether she would like to move forward as the benefit of surgery may or may not be realized due to significant decline over the last 2 months.  My plan is to call her after the first of the year for a phone visit to assess for any improvement.  We will continue to have discussions regarding surgery versus continued  medical management.  She was asked to continue her current medication regimen.   Broadus John, MD [facility] [phone]

## 2022-06-09 ENCOUNTER — Encounter: Payer: Self-pay | Admitting: Adult Health

## 2022-06-10 ENCOUNTER — Ambulatory Visit (INDEPENDENT_AMBULATORY_CARE_PROVIDER_SITE_OTHER): Payer: Medicare HMO | Admitting: Vascular Surgery

## 2022-06-10 DIAGNOSIS — I63239 Cerebral infarction due to unspecified occlusion or stenosis of unspecified carotid arteries: Secondary | ICD-10-CM

## 2022-06-10 NOTE — Addendum Note (Signed)
Addended by: Kenard Gower C on: 06/10/2022 11:31 AM   Modules accepted: Orders

## 2022-06-12 ENCOUNTER — Encounter: Payer: Self-pay | Admitting: Family Medicine

## 2022-06-13 NOTE — Telephone Encounter (Signed)
Message sent to [facility] due to Dr. Hyacinth Meeker out of office.

## 2022-06-14 ENCOUNTER — Ambulatory Visit: Payer: Medicare HMO | Admitting: Family Medicine

## 2022-06-14 NOTE — Telephone Encounter (Signed)
Will have Dr.Miller Address this on upcoming visit.

## 2022-06-15 ENCOUNTER — Ambulatory Visit (INDEPENDENT_AMBULATORY_CARE_PROVIDER_SITE_OTHER): Payer: Medicare HMO | Admitting: Family Medicine

## 2022-06-15 ENCOUNTER — Encounter: Payer: Self-pay | Admitting: Family Medicine

## 2022-06-15 VITALS — BP 128/66 | HR 96 | Temp 97.3°F | Ht 61.0 in | Wt 115.4 lb

## 2022-06-15 DIAGNOSIS — T7840XA Allergy, unspecified, initial encounter: Secondary | ICD-10-CM

## 2022-06-15 DIAGNOSIS — D649 Anemia, unspecified: Secondary | ICD-10-CM

## 2022-06-15 DIAGNOSIS — F32A Depression, unspecified: Secondary | ICD-10-CM

## 2022-06-15 DIAGNOSIS — F419 Anxiety disorder, unspecified: Secondary | ICD-10-CM

## 2022-06-15 DIAGNOSIS — N289 Disorder of kidney and ureter, unspecified: Secondary | ICD-10-CM

## 2022-06-15 DIAGNOSIS — I6930 Unspecified sequelae of cerebral infarction: Secondary | ICD-10-CM

## 2022-06-15 DIAGNOSIS — E871 Hypo-osmolality and hyponatremia: Secondary | ICD-10-CM

## 2022-06-15 DIAGNOSIS — E8770 Fluid overload, unspecified: Secondary | ICD-10-CM

## 2022-06-15 MED ORDER — MONTELUKAST SODIUM 10 MG PO TABS: 10.0000 mg | ORAL_TABLET | Freq: Every day | ORAL | 3 refills | Status: DC

## 2022-06-15 MED ORDER — MIRTAZAPINE 7.5 MG PO TABS: 7.5000 mg | ORAL_TABLET | Freq: Every day | ORAL | 0 refills | Status: DC

## 2022-06-15 NOTE — Progress Notes (Signed)
Provider:  Jacalyn LefevreStephen Lasondra Hodgkins, MD  Careteam: Patient Care Team: Frederica KusterMiller, Zebastian Carico M, MD as PCP - General (Family Medicine) SwazilandJordan, Peter M, MD as PCP - Cardiology (Cardiology) Marisue BrooklynEjaz, Muhammad Shakir, MD as Referring Physician  [facility]ICE[facility]  [facility]  Advanced Directive information    Allergies  Allergen Reactions   Codeine Nausea And Vomiting   Cyclobenzaprine Other (See Comments)    Unknown reaction   Iodinated Contrast Media Nausea Only   Prednisone Other (See Comments)    "Sores in the mouth"    No chief complaint on file.    HPI: Patient is a 87 y.o. female Patient presents today with her daughter.  Since she had a stroke back in September she has moved into assisted living and is not happy there.  She had previously lived in her home.  She had a fall last week and hit her face she went to the hospital and was released without significant injury.  She is completing PT and OT at her assisted living facility.  For the past 4 days she has had cough she thinks this is related to some postnasal drip she had previously been taking Singulair but it stopped in favor of Allegra.  I think Singulair would be a better choice than Allegra since Allegra being an antihistamine might thicken up any postnasal drainage.  She is also taking Lasix for dependent edema dose is 10 mg twice daily she would prefer to take this once a day so I gave her permission to take 20 mg once a day in the mornings.  She also complains of feeling tired a lot in reviewing her labs her hemoglobin is low at 8.8 daughter is concerned that there may be a iron or B12 deficiency.  She also has some chronic kidney disease which may relate to the anemia.  Also she endorses not sleeping well She has lost weight over the past 6 months.  Also anorectic; she does not like food at facility where she is living and overall is just not very happy there she is currently taking Lexapro 10 mg.  That has been increased to 10 mg  from 5 recently so it probably has had not enough time to make a difference.  Review of Systems:  Review of Systems  Constitutional:  Positive for malaise/fatigue and weight loss.  Respiratory:  Positive for cough.   Cardiovascular:  Positive for leg swelling.  Neurological: Negative.   Psychiatric/Behavioral:  Positive for depression. The patient has insomnia.   All other systems reviewed and are negative.   Past Medical History:  Diagnosis Date   Allergies    Anxiety    panic attacks   Aortic stenosis    Arthritis    Bronchiectasis with (acute) exacerbation (HCC)    CAD (coronary artery disease)    non obst CAD   Depression due to acute stroke (HCC)    Dizziness    Elevated liver enzymes    Fall    Fluid overload    GERD (gastroesophageal reflux disease)    Heart murmur    Hemorrhoids    History of mammogram    Hyperlipemia    Hypertension    Internal carotid artery stenosis    OCD (obsessive compulsive disorder)    Orthostatic hypotension    Other cirrhosis of liver (HCC)    PAF (paroxysmal atrial fibrillation) (HCC)    Pericardial effusion    Physical deconditioning    Renal insufficiency    Rheumatic  heart failure (congestive) (HCC)    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 Ali Lowe & Dr. Cyndia Bent   Seasonal allergies    Severe aortic stenosis    Stroke [facility])    Takotsubo cardiomyopathy    Past Surgical History:  Procedure Laterality Date   COLONOSCOPY     EYE SURGERY Bilateral    cataracts removed   INTRAOPERATIVE TRANSTHORACIC ECHOCARDIOGRAM N/A 08/03/2021   Procedure: INTRAOPERATIVE TRANSTHORACIC ECHOCARDIOGRAM;  Surgeon: Early Osmond, MD;  Location: [place] [facility];  Service: Open Heart Surgery;  Laterality: N/A;   IR CT HEAD LTD  03/25/2022   IR PERCUTANEOUS ART THROMBECTOMY/INFUSION INTRACRANIAL INC DIAG ANGIO  03/25/2022   IR US GUIDE VASC ACCESS RIGHT  03/25/2022    PERICARDIOCENTESIS N/A 07/28/2021   Procedure: PERICARDIOCENTESIS;  Surgeon: Early Osmond, MD;  Location: [place] [facility];  Service: Cardiovascular;  Laterality: N/A;   RADIOLOGY WITH ANESTHESIA N/A 03/25/2022   Procedure: IR WITH ANESTHESIA;  Surgeon: Radiologist, Medication, MD;  Location: [place];  Service: Radiology;  Laterality: N/A;   RIGHT/LEFT HEART CATH AND CORONARY ANGIOGRAPHY N/A 05/13/2019   Procedure: RIGHT/LEFT HEART CATH AND CORONARY ANGIOGRAPHY;  Surgeon: Martinique, Peter M, MD;  Location: [place] [facility];  Service: Cardiovascular;  Laterality: N/A;   RIGHT/LEFT HEART CATH AND CORONARY ANGIOGRAPHY N/A 07/23/2021   Procedure: RIGHT/LEFT HEART CATH AND CORONARY ANGIOGRAPHY;  Surgeon: Jettie Booze, MD;  Location: [place] [facility];  Service: Cardiovascular;  Laterality: N/A;   TONSILLECTOMY     TRANSCATHETER AORTIC VALVE REPLACEMENT, TRANSFEMORAL N/A 08/03/2021   Procedure: TRANSCATHETER AORTIC VALVE REPLACEMENT, TRANSFEMORAL;  Surgeon: Early Osmond, MD;  Location: [place] [facility];  Service: Open Heart Surgery;  Laterality: N/A;   UPPER GI ENDOSCOPY     Social History:   reports that she has quit smoking. Her smoking use included cigarettes. She has a 20.00 pack-year smoking history. She has never used smokeless tobacco. She reports current alcohol use. She reports that she does not use drugs.  Family History  Problem Relation Age of Onset   Cancer Sister    Lung cancer Sister    Alcohol abuse Sister    Melanoma Brother    Brain cancer Brother    Heart failure Brother    Diabetes Brother    Cancer Brother    Heart Problems Brother    Down syndrome Brother    Heart failure Brother    Endometriosis Daughter    Hypertension Other     Medications: Patient's Medications  New Prescriptions   No medications on file  Previous Medications   ACETAMINOPHEN (TYLENOL) 500 MG TABLET    Take 500-1,000 mg by mouth every 6 (six) hours as needed for moderate  pain.   ALBUTEROL (VENTOLIN HFA) 108 (90 BASE) MCG/ACT INHALER    Inhale 2 puffs into the lungs every 4 (four) hours as needed for wheezing or shortness of breath (seasonal allergies).   DABIGATRAN (PRADAXA) 150 MG CAPS CAPSULE    Take 1 capsule (150 mg total) by mouth every 12 (twelve) hours.   DOCUSATE SODIUM (COLACE) 100 MG CAPSULE    Take 100 mg by mouth 2 (two) times daily.   ESCITALOPRAM (LEXAPRO) 10 MG TABLET    Take 1 tablet (10 mg total) by mouth daily.   FAMOTIDINE (PEPCID) 10 MG TABLET    Take 10 mg by mouth daily as  needed for heartburn.   FEXOFENADINE (ALLEGRA) 60 MG TABLET    Take 60 mg by mouth daily.   FUROSEMIDE (LASIX) 20 MG TABLET    Take 0.5 tablets (10 mg total) by mouth 2 (two) times daily.   LISINOPRIL (ZESTRIL) 5 MG TABLET    Take 0.5 tablets (2.5 mg total) by mouth daily.   MULTIPLE VITAMINS-MINERALS (EMERGEN-C VITAMIN C PO)    Take 1 packet by mouth daily. Mix with water   ROSUVASTATIN (CRESTOR) 20 MG TABLET    Take 1 tablet (20 mg total) by mouth daily.   TROLAMINE SALICYLATE (ASPERCREME EX)    Apply 1 application topically daily as needed (knee pain).  Modified Medications   No medications on file  Discontinued Medications   No medications on file    Physical Exam:  Vitals:   06/15/22 1016  Weight: 115 lb 6.4 oz (52.3 kg)  Height:  (1.549 m)   Body mass index is 21.8 kg/m. Wt Readings from Last 3 Encounters:  06/15/22 115 lb 6.4 oz (52.3 kg)  06/03/22 116 lb (52.6 kg)  06/02/22 117 lb (53.1 kg)    Physical Exam Vitals and nursing note reviewed.  Constitutional:      Appearance: Normal appearance.  HENT:     Mouth/Throat:     Mouth: Mucous membranes are moist.     Pharynx: Oropharynx is clear.  Cardiovascular:     Rate and Rhythm: Normal rate and regular rhythm.     Heart sounds: Murmur heard.  Pulmonary:     Effort: Pulmonary effort is normal.     Breath sounds: Normal breath sounds.  Musculoskeletal:     Left lower leg: Edema present.   Neurological:     General: No focal deficit present.     Mental Status: She is alert and oriented to person, place, and time.     Labs reviewed: Basic Metabolic Panel: Recent Labs    07/22/21 0022 07/22/21 0421 07/23/21 0745 08/04/21 1610 10/26/21 1625 02/15/22 1119 03/25/22 1059 05/10/22 1532 05/11/22 0958 05/13/22 0358 05/19/22 1517  NA  --  131*   < > 133*   < > 136   < > 133* 134* 134* 136  K  --  3.9   < > 4.4   < > 4.4   < > 4.1 3.5 3.5 4.6  CL  --  93*   < > 101   < > 99   < > 98 100 100 102  CO2  --  28   < > 22   < > 31   < > GLUCOSE  --  109*   < > 134*   < > 87   < > 92 158* 103* 87  BUN  --  21   < > 29*   < > 20   < > CREATININE  --  1.09*   < > 1.15*   < > 0.89   < > 0.96 0.99 1.02* 0.95  CALCIUM  --  9.1   < > 8.9   < > 9.5   < > 10.0 9.1 9.1 9.3  MG  --  2.2  --  2.1  --   --   --   --   --  2.1  --   PHOS 3.0 3.4  --   --   --   --   --   --   --   --   --  TSH 1.763 1.472  --   --   --  1.33  --  0.623  --   --   --    < > = values in this interval not displayed.   Liver Function Tests: Recent Labs    05/10/22 1532 05/11/22 0958 05/13/22 0358 05/19/22 1517  AST 53* 50* 38 48*  ALT 30 26 22 23   ALKPHOS 53 47 41  --   BILITOT 1.5* 1.7* 1.3* 1.2  PROT 7.7 6.1* 5.6* 6.4  ALBUMIN 4.3 3.3* 3.1*  --    No results for input(s): "LIPASE", "AMYLASE" in the last 8760 hours. Recent Labs    05/10/22 1532 05/19/22 1517  AMMONIA 19 <10   CBC: Recent Labs    03/25/22 1059 03/25/22 1105 05/10/22 1532 05/12/22 0311 05/19/22 1517  WBC 5.7   < > 8.0 7.4 6.1  NEUTROABS 2.9  --  5.7  --  3,129  HGB 10.6*   < > 10.8* 8.8* 9.6*  HCT 32.5*   < > 34.2* 27.0* 30.2*  MCV 90.0   < > 91.7 90.6 92.1  PLT 245   < > 280 216 269   < > = values in this interval not displayed.   Lipid Panel: Recent Labs    02/15/22 1119 03/26/22 0137  CHOL 204* 204*  HDL 85 86  LDLCALC 95 103*  TRIG 137 77  CHOLHDL 2.4 2.4   TSH: Recent  Labs    07/22/21 0421 02/15/22 1119 05/10/22 1532  TSH 1.472 1.33 0.623   A1C: Lab Results  Component Value Date   HGBA1C 5.4 03/26/2022     Assessment/Plan  1. Anemia, unspecified type Red blood cell indices suggest anemia of chronic disease but will check iron and B12 level  2. Depression, unspecified depression type Increased dose of Lexapro needs more time to see if that might be effective.  Did begin low-dose mirtazapine today although this is more for appetite and sleep  3. Allergy, initial encounter Continue Allegra and use Singulair 10 mg a day  4. Anxiety I think her problem is more depression than anxiety although as is typical probably some of each hopefully the Lexapro will help with this as well as depression  5. Hypervolemia, unspecified hypervolemia type Uncertain etiology for the edema.  Suspect venous insufficiency  6. History of CVA with residual deficit She has been in therapy and regained most of the strength.  She did have some weakness on the right side since it was a left-sided stroke  7. Hyponatremia We typically follow her sodiums regularly and there is been no evidence of hyponatremia on this SSRI  8. Renal insufficiency Recent creatinines have been in the range of 0.95-1.05   05-15-1988, MD [facility] [phone]

## 2022-06-15 NOTE — Patient Instructions (Signed)
Let's try those changes on your [facility] orders Keep moving  BLOOM WHERE YOU ARE [id number]

## 2022-06-16 LAB — VITAMIN B12: Vitamin B-12: 632 pg/mL (ref 200–1100)

## 2022-06-16 LAB — IRON,TIBC AND FERRITIN PANEL
%SAT: 7 % (calc) — ABNORMAL LOW (ref 16–45)
Ferritin: 159 ng/mL (ref 16–288)
Iron: 20 ug/dL — ABNORMAL LOW (ref 45–160)
TIBC: 279 mcg/dL (calc) (ref 250–450)

## 2022-06-22 ENCOUNTER — Ambulatory Visit (INDEPENDENT_AMBULATORY_CARE_PROVIDER_SITE_OTHER): Payer: Medicare HMO | Admitting: Family

## 2022-06-22 ENCOUNTER — Encounter: Payer: Self-pay | Admitting: Family

## 2022-06-22 VITALS — BP 130/70 | HR 93 | Temp 98.0°F | Resp 16 | Ht 61.0 in | Wt 114.6 lb

## 2022-06-22 DIAGNOSIS — J0101 Acute recurrent maxillary sinusitis: Secondary | ICD-10-CM | POA: Diagnosis not present

## 2022-06-22 DIAGNOSIS — R0982 Postnasal drip: Secondary | ICD-10-CM | POA: Diagnosis not present

## 2022-06-22 DIAGNOSIS — H6121 Impacted cerumen, right ear: Secondary | ICD-10-CM

## 2022-06-22 DIAGNOSIS — D649 Anemia, unspecified: Secondary | ICD-10-CM | POA: Diagnosis not present

## 2022-06-22 MED ORDER — DEBROX 6.5 % OT SOLN: 5.0000 [drp] | Freq: Two times a day (BID) | OTIC | 0 refills | Status: AC

## 2022-06-22 MED ORDER — FEXOFENADINE HCL 60 MG PO TABS: 60.0000 mg | ORAL_TABLET | Freq: Every day | ORAL | 0 refills | Status: DC

## 2022-06-22 MED ORDER — IRON (FERROUS SULFATE) 325 (65 FE) MG PO TABS: 325.0000 mg | ORAL_TABLET | ORAL | 3 refills | Status: DC

## 2022-06-22 MED ORDER — SACCHAROMYCES BOULARDII 250 MG PO CAPS: 250.0000 mg | ORAL_CAPSULE | Freq: Two times a day (BID) | ORAL | 0 refills | Status: AC

## 2022-06-22 MED ORDER — AMOXICILLIN-POT CLAVULANATE 875-125 MG PO TABS: 1.0000 | ORAL_TABLET | Freq: Two times a day (BID) | ORAL | 0 refills | Status: DC

## 2022-06-22 NOTE — Progress Notes (Signed)
Provider: Treyden Hakim FNP-C  Frederica Kuster, MD  Patient Care Team: Frederica Kuster, MD as PCP - General (Family Medicine) [place], Peter M, MD as PCP - Cardiology (Cardiology) Marisue Brooklyn, MD as Referring Physician  Extended Emergency Contact Information Primary Emergency Contact: [facility] Address: [address] [place], [place] [address] D[place] of [place] Phone: [phone] Relation: Daughter  Code Status:  DNR Goals of care: Advanced Directive information    06/22/2022    1:34 PM  Advanced Directives  Does Patient Have a Medical Advance Directive? Yes  Type of Estate agent of [place];Living will  Does patient want to make changes to medical advance directive? No - Patient declined  Copy of Healthcare Power of Attorney in Chart? Yes - validated most recent copy scanned in chart (See row information)     Chief Complaint  Patient presents with   Acute Visit    Patient complains of productive cough, runny nose, and congestion.    Concerns     Patient daughter is confused as to why Dr.Miller wrote on [place] papers 06/15/2022, to hold Allegra but office note states to continue Allegra and Singular 10mg .     HPI:  Pt is a 87 y.o. female seen today for an acute visit for evaluation of cough,runny nose and nasal congestion.she is here with her daughter.she concerned about her allegra states was told to hold allegra but notes states continue including Singulair.  Also states continues to feel tired all the time and has progressive weight loss. Though latest hemoglobin indicated anemia 9.6 g/dL.states had refused iron supplement but would like to reconsider taking ferrous sulfate.  Has had a one pound weight loss since last visit.    Past Medical History:  Diagnosis Date   Allergies    Anxiety    panic attacks   Aortic stenosis    Arthritis    Bronchiectasis with (acute) exacerbation (HCC)    CAD  (coronary artery disease)    non obst CAD   Depression due to acute stroke (HCC)    Dizziness    Elevated liver enzymes    Fall    Fluid overload    GERD (gastroesophageal reflux disease)    Heart murmur    Hemorrhoids    History of mammogram    Hyperlipemia    Hypertension    Internal carotid artery stenosis    OCD (obsessive compulsive disorder)    Orthostatic hypotension    Other cirrhosis of liver (HCC)    PAF (paroxysmal atrial fibrillation) (HCC)    Pericardial effusion    Physical deconditioning    Renal insufficiency    Rheumatic heart failure (congestive) (HCC)    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 10[birth year]28-1972 & Dr. Lynnette Caffey   Seasonal allergies    Severe aortic stenosis    Stroke [facility])    Takotsubo cardiomyopathy    Past Surgical History:  Procedure Laterality Date   COLONOSCOPY     EYE SURGERY Bilateral    cataracts removed   INTRAOPERATIVE TRANSTHORACIC ECHOCARDIOGRAM N/A 08/03/2021   Procedure: INTRAOPERATIVE TRANSTHORACIC ECHOCARDIOGRAM;  Surgeon: 10/01/2021, MD;  Location: [facility] INVASIVE CV LAB;  Service: Open Heart Surgery;  Laterality: N/A;   IR CT HEAD LTD  03/25/2022   IR PERCUTANEOUS ART THROMBECTOMY/INFUSION INTRACRANIAL INC DIAG ANGIO  03/25/2022   IR US GUIDE VASC ACCESS RIGHT  03/25/2022   PERICARDIOCENTESIS N/A 07/28/2021   Procedure: PERICARDIOCENTESIS;  Surgeon: Orbie Pyo, MD;  Location: [facility] INVASIVE CV LAB;  Service: Cardiovascular;  Laterality: N/A;   RADIOLOGY WITH ANESTHESIA N/A 03/25/2022   Procedure: IR WITH ANESTHESIA;  Surgeon: Radiologist, Medication, MD;  Location: [facility] OR;  Service: Radiology;  Laterality: N/A;   RIGHT/LEFT HEART CATH AND CORONARY ANGIOGRAPHY N/A 05/13/2019   Procedure: RIGHT/LEFT HEART CATH AND CORONARY ANGIOGRAPHY;  Surgeon: [place]azil[place], Peter M, MD;  Location: [facility] INVASIVE CV LAB;  Service: Cardiovascular;  Laterality: N/A;   RIGHT/LEFT  HEART CATH AND CORONARY ANGIOGRAPHY N/A 07/23/2021   Procedure: RIGHT/LEFT HEART CATH AND CORONARY ANGIOGRAPHY;  Surgeon: Corky Crafts, MD;  Location[facility] INVASIVE CV LAB;  Service: Cardiovascular;  Laterality: N/A;   TONSILLECTOMY     TRANSCATHETER AORTIC VALVE REPLACEMENT, TRANSFEMORAL N/A 08/03/2021   Procedure: TRANSCATHETER AORTIC VALVE REPLACEMENT, TRANSFEMORAL;  Surgeon: Orbie Pyo, MD;  Location: [facility] INVASIVE CV LAB;  Service: Open Heart Surgery;  Laterality: N/A;   UPPER GI ENDOSCOPY      Allergies  Allergen Reactions   Codeine Nausea And Vomiting   Cyclobenzaprine Other (See Comments)    Unknown reaction   Iodinated Contrast Media Nausea Only   Prednisone Other (See Comments)    "Sores in the mouth"    Outpatient Encounter Medications as of 06/22/2022  Medication Sig   acetaminophen (TYLENOL) 500 MG tablet Take 500-1,000 mg by mouth every 6 (six) hours as needed for moderate pain.   albuterol (VENTOLIN HFA) 108 (90 Base) MCG/ACT inhaler Inhale 2 puffs into the lungs every 4 (four) hours as needed for wheezing or shortness of breath (seasonal allergies).   dabigatran (PRADAXA) 150 MG CAPS capsule Take 1 capsule (150 mg total) by mouth every 12 (twelve) hours.   docusate sodium (COLACE) 100 MG capsule Take 100 mg by mouth 2 (two) times daily.   escitalopram (LEXAPRO) 10 MG tablet Take 1 tablet (10 mg total) by mouth daily.   famotidine (PEPCID) 10 MG tablet Take 10 mg by mouth daily as needed for heartburn.   furosemide (LASIX) 20 MG tablet Take 20 mg by mouth daily.   lisinopril (ZESTRIL) 5 MG tablet Take 0.5 tablets (2.5 mg total) by mouth daily.   mirtazapine (REMERON) 7.5 MG tablet Take 1 tablet (7.5 mg total) by mouth at bedtime.   montelukast (SINGULAIR) 10 MG tablet Take 1 tablet (10 mg total) by mouth daily.   Multiple Vitamins-Minerals (EMERGEN-C VITAMIN C PO) Take 1 packet by mouth daily. Mix with water   rosuvastatin (CRESTOR) 20 MG tablet Take 1 tablet  (20 mg total) by mouth daily.   Trolamine Salicylate (ASPERCREME EX) Apply 1 application topically daily as needed (knee pain).   fexofenadine (ALLEGRA) 60 MG tablet Take 60 mg by mouth daily. (Patient not taking: Reported on 06/22/2022)   No facility-administered encounter medications on file as of 06/22/2022.    Review of Systems  Constitutional:  Positive for fatigue. Negative for appetite change, chills, fever and unexpected weight change.  HENT:  Positive for congestion and rhinorrhea. Negative for dental problem, ear discharge, ear pain, facial swelling, hearing loss, nosebleeds, postnasal drip, sinus pressure, sinus pain, sneezing, sore throat, tinnitus and trouble swallowing.   Eyes:  Negative for pain, discharge, redness, itching and visual disturbance.  Respiratory:  Positive for cough. Negative for chest tightness, shortness of breath and wheezing.   Cardiovascular:  Negative for chest pain,  palpitations and leg swelling.  Gastrointestinal:  Negative for abdominal distention, abdominal pain, blood in stool, constipation, diarrhea, nausea and vomiting.  Genitourinary:  Negative for difficulty urinating, dysuria, flank pain, frequency and urgency.  Musculoskeletal:  Negative for arthralgias, back pain, gait problem, joint swelling, myalgias, neck pain and neck stiffness.  Skin:  Negative for color change, pallor, rash and wound.  Neurological:  Negative for dizziness, syncope, speech difficulty, weakness, light-headedness, numbness and headaches.  Hematological:  Does not bruise/bleed easily.    Immunization History  Administered Date(s) Administered   Fluad Quad(high Dose 65+) 04/07/2019, 05/13/2022   Influenza Split 05/10/2006, 04/25/2007, 04/08/2008, 11/10/2008   Influenza, High Dose Seasonal PF 04/20/2017, 04/07/2019, 03/25/2020, 04/27/2021   Influenza-Unspecified 05/10/2006, 04/25/2007, 04/08/2008, 07/09/2008, 11/10/2008, 03/31/2015, 04/20/2018, 04/07/2019   PFIZER  Comirnaty(Gray Top)Covid-19 Tri-Sucrose Vaccine 08/14/2019, 09/04/2019, 04/20/2020, 10/25/2020, 04/12/2021   PPD Test 05/12/2022   Pneumococcal Conjugate-13 09/29/2015   Pneumococcal Polysaccharide-23 03/12/2007   Tdap 07/14/2014   Zoster Recombinat (Shingrix) 12/10/2019, 02/08/2020   Zoster, Live 08/18/2004, 01/17/2007   Pertinent  Health Maintenance Due  Topic Date Due   DEXA SCAN  Never done   INFLUENZA VACCINE  Completed      05/12/2022    8:25 PM 05/13/2022    7:35 PM 05/14/2022    9:45 AM 05/14/2022    8:40 PM 06/22/2022    1:33 PM  Fall Risk  Falls in the past year?     0  Was there an injury with Fall?     0  Fall Risk Category Calculator     0  Fall Risk Category     Low  Patient Fall Risk Level High fall risk High fall risk High fall risk High fall risk Low fall risk  Patient at Risk for Falls Due to     No Fall Risks  Fall risk Follow up     Falls evaluation completed   Functional Status Survey:    Vitals:   06/22/22 1331  BP: 130/70  Pulse: 93  Resp: 16  Temp: 98 F (36.7 C)  SpO2: 98%  Weight: 114 lb 9.6 oz (52 kg)  Height: 5\' 1"  (1.549 m)   Body mass index is 21.65 kg/m. Physical Exam Vitals reviewed.  Constitutional:      General: She is not in acute distress.    Appearance: Normal appearance. She is normal weight. She is not ill-appearing or diaphoretic.  HENT:     Head: Normocephalic.     Right Ear: There is impacted cerumen.     Left Ear: Tympanic membrane, ear canal and external ear normal. There is no impacted cerumen.     Ears:     Comments: Right ear cerumen lavaged with warm water and hydrogen peroxide moderate amounts of cerumen obtained.Tolerated procedure well.TM clear without any signs of infection.     Nose: No congestion or rhinorrhea.     Right Turbinates: Enlarged. Not swollen or pale.     Left Turbinates: Not enlarged or swollen.     Right Sinus: Maxillary sinus tenderness present. No frontal sinus tenderness.     Left  Sinus: Maxillary sinus tenderness present. No frontal sinus tenderness.     Mouth/Throat:     Mouth: Mucous membranes are moist.     Pharynx: Oropharynx is clear. No oropharyngeal exudate or posterior oropharyngeal erythema.  Eyes:     General: No scleral icterus.       Right eye: No discharge.        Left  eye: No discharge.     Extraocular Movements: Extraocular movements intact.     Conjunctiva/sclera: Conjunctivae normal.     Pupils: Pupils are equal, round, and reactive to light.  Neck:     Vascular: No carotid bruit.  Cardiovascular:     Rate and Rhythm: Normal rate and regular rhythm.     Pulses: Normal pulses.     Heart sounds: Normal heart sounds. No murmur heard.    No friction rub. No gallop.  Pulmonary:     Effort: Pulmonary effort is normal. No respiratory distress.     Breath sounds: Normal breath sounds. No wheezing, rhonchi or rales.  Chest:     Chest wall: No tenderness.  Abdominal:     General: Bowel sounds are normal. There is no distension.     Palpations: Abdomen is soft. There is no mass.     Tenderness: There is no abdominal tenderness. There is no right CVA tenderness, left CVA tenderness, guarding or rebound.  Musculoskeletal:        General: No swelling or tenderness. Normal range of motion.     Cervical back: Normal range of motion. No rigidity or tenderness.     Right lower leg: No edema.     Left lower leg: No edema.  Lymphadenopathy:     Cervical: No cervical adenopathy.  Skin:    General: Skin is warm and dry.     Coloration: Skin is not pale.     Findings: No bruising, erythema, lesion or rash.  Neurological:     Mental Status: She is alert and oriented to person, place, and time.     Cranial Nerves: No cranial nerve deficit.     Sensory: No sensory deficit.     Motor: No weakness.     Coordination: Coordination normal.     Gait: Gait normal.  Psychiatric:        Mood and Affect: Mood normal.        Speech: Speech normal.        Behavior:  Behavior normal.        Thought Content: Thought content normal.        Judgment: Judgment normal.     Labs reviewed: Recent Labs    07/22/21 0022 07/22/21 0421 07/23/21 0745 08/04/21 0452 10/26/21 1625 05/11/22 0958 05/13/22 0358 05/19/22 1517  NA  --  131*   < > 133*   < > 134* 134* 136  K  --  3.9   < > 4.4   < > 3.5 3.5 4.6  CL  --  93*   < > 101   < > 100 100 102  CO2  --  28   < > 22   < > GLUCOSE  --  109*   < > 134*   < > 158* 103* 87  BUN  --  21   < > 29*   < > CREATININE  --  1.09*   < > 1.15*   < > 0.99 1.02* 0.95  CALCIUM  --  9.1   < > 8.9   < > 9.1 9.1 9.3  MG  --  2.2  --  2.1  --   --  2.1  --   PHOS 3.0 3.4  --   --   --   --   --   --    < > = values in this interval not displayed.  Recent Labs    05/10/22 1532 05/11/22 0958 05/13/22 0358 05/19/22 1517  AST 53* 50* 38 48*  ALT 30 26 22 23   ALKPHOS 53 47 41  --   BILITOT 1.5* 1.7* 1.3* 1.2  PROT 7.7 6.1* 5.6* 6.4  ALBUMIN 4.3 3.3* 3.1*  --    Recent Labs    03/25/22 1059 03/25/22 1105 05/10/22 1532 05/12/22 0311 05/19/22 1517  WBC 5.7   < > 8.0 7.4 6.1  NEUTROABS 2.9  --  5.7  --  3,129  HGB 10.6*   < > 10.8* 8.8* 9.6*  HCT 32.5*   < > 34.2* 27.0* 30.2*  MCV 90.0   < > 91.7 90.6 92.1  PLT 245   < > 280 216 269   < > = values in this interval not displayed.   Lab Results  Component Value Date   TSH 0.623 05/10/2022   Lab Results  Component Value Date   HGBA1C 5.4 03/26/2022   Lab Results  Component Value Date   CHOL 204 (H) 03/26/2022   HDL 86 03/26/2022   LDLCALC 103 (H) 03/26/2022   TRIG 77 03/26/2022   CHOLHDL 2.4 03/26/2022    Significant Diagnostic Results in last 30 days:  LONG TERM MONITOR-LIVE TELEMETRY (3-14 DAYS)  Result Date: 06/07/2022   Nromal sinus rhythm   Rare PVCs with bigeminy   Occasional PACs with short runs of SVT   On October 27 there were 2 episoded of SVT c/w atrial flutter at 2:48 and 5:42 pm. longest lasting 8 minutes and 24  seconds. Patch Wear Time:  13 days and 21 hours (2023-10-20T13:40:10-398 to 2023-11-03T11:06:28-0400) Patient had a min HR of 61 bpm, max HR of 211 bpm, and avg HR of 90 bpm. Predominant underlying rhythm was Sinus Rhythm. QRS morphology changes were present throughout recording. 53 Supraventricular Tachycardia runs occurred, the run with the fastest interval lasting 6 beats with a max rate of 211 bpm, the longest lasting 8 mins 24 secs with an avg rate of 158 bpm. Some episodes of Supraventricular Tachycardia may be possible Atrial Tachycardia with variable block. Isolated SVEs were occasional (1.6%, 28180), SVE Couplets were rare (<1.0%, 955), and SVE Triplets were rare (<1.0%, 127). Isolated VEs were rare (<1.0%, 12422), VE Couplets were rare (<1.0%, 213), and VE Triplets were rare (<1.0%, 63). Ventricular Bigeminy and Trigeminy were present.    Assessment/Plan 1. Acute recurrent maxillary sinusitis Maxillary sinus tender to palpation  - will restart allegra then revaluate use on upcoming visit  - amoxicillin-clavulanate (AUGMENTIN) 875-125 MG tablet; Take 1 tablet by mouth 2 (two) times daily.  Dispense: 20 tablet; Refill: 0 - saccharomyces boulardii (FLORASTOR) 250 MG capsule; Take 1 capsule (250 mg total) by mouth 2 (two) times daily for 10 days.  Dispense: 20 capsule; Refill: 0  2. Anemia, unspecified type Hgb A1C 9.6 would like to try iron sulfate as below. - Iron, Ferrous Sulfate, 325 (65 Fe) MG TABS; Take 325 mg by mouth 3 (three) times a week. On Monday,Wednesday and Friday  Dispense: 30 tablet; Refill: 3  3. PND (post-nasal drip) Start on  - fexofenadine (ALLEGRA) 60 MG tablet; Take 1 tablet (60 mg total) by mouth daily for 20 days.  Dispense: 20 tablet; Refill: 0  4. Right ear impacted cerumen Right ear cerumen lavaged with warm water and hydrogen peroxide moderate amounts of cerumen obtained.Tolerated procedure well.TM clear without any signs of infection. - carbamide peroxide  (DEBROX) 6.5 % OTIC solution; Place 5 drops  into the right ear 2 (two) times daily for 4 days.  Dispense: 2 mL; Refill: 0    Family/ staff Communication: Reviewed plan of care with patient and daughter verbalized understanding.  Labs/tests ordered: None   Next Appointment: Return in about 5 days (around 06/27/2022), or if symptoms worsen or fail to improve, for right ear lavage .   Caesar Bookman, NP

## 2022-06-22 NOTE — Patient Instructions (Signed)
Please contact your local pharmacy, previous provider, or insurance carrier for vaccine/immunization records. Ensure that any procedures done outside of [facility] are faxed to us [phone] or you can sign release of records form at the front desk to keep your medical record updated.   ?

## 2022-06-23 ENCOUNTER — Encounter: Payer: Self-pay | Admitting: Adult Health

## 2022-06-28 ENCOUNTER — Encounter: Payer: Self-pay | Admitting: Family

## 2022-06-28 ENCOUNTER — Ambulatory Visit (INDEPENDENT_AMBULATORY_CARE_PROVIDER_SITE_OTHER): Payer: Medicare HMO | Admitting: Family

## 2022-06-28 VITALS — BP 120/70 | HR 98 | Temp 98.1°F | Resp 16 | Ht 61.0 in | Wt 114.8 lb

## 2022-06-28 DIAGNOSIS — R634 Abnormal weight loss: Secondary | ICD-10-CM

## 2022-06-28 DIAGNOSIS — H6121 Impacted cerumen, right ear: Secondary | ICD-10-CM | POA: Diagnosis not present

## 2022-06-28 NOTE — Progress Notes (Signed)
Provider: Dejaun Vidrio FNP-C  Frederica Kuster, MD  Patient Care Team: Frederica Kuster, MD as PCP - General (Family Medicine) Sw[place]il[place], Peter M, MD as PCP - Cardiology (Cardiology) Marisue Brooklyn, MD as Referring Physician  Extended Emergency Contact Information Primary Emergency Contact: [facility] Address: [address] [place], [place] [address] Darden [place] of [place] Mobile Phone: [phone] Relation: Daughter  Code Status:  Full Code  Goals of care: Advanced Directive information    06/28/2022    1:36 PM  Advanced Directives  Does Patient Have a Medical Advance Directive? Yes  Type of Estate agent of [place];Living will  Does patient want to make changes to medical advance directive? No - Patient declined  Copy of Healthcare Power of Attorney in Chart? Yes - validated most recent copy scanned in chart (See row information)     Chief Complaint  Patient presents with   Procedure    Right ear lavage.     HPI:  Pt is a 87 y.o. female seen today for an acute visit for evaluation of right ear lavage.she was here on 06/22/2022 for acute recurrent maxillary sinusitis cerumen impaction was also noted.She was advised to instil debrox 6.5 % otic solution 5 drops into each ear twice daily x 4 days then follow up today for ear lavage. States sinus infection is much better.Has 4 more days of antibiotics. States sometimes feels " bad" few hours after taking her medication unable to describe feeling.States used to be dizzy but symptoms resolved.Has noticed symptoms worst in the morning and evening after taking medication.Daughter states only change was her lisinopril and change of Eliquis to Pradaxa.No signs of bleeding reported. No home blood pressure for review but states her normal B/p usually in the 140's.She denies any headache,dizziness,vision changes,fatigue,chest tightness,palpitation,chest pain or shortness of breath.    Has had  weight loss :  wt 117 lbs ( 06/02/2022 )  wt 116 lbs ( 06/04/2022 )  wt 115 lbs ( 06/03/2022 )  wt 114.8 lbs ( 06/28/2022 )  Daughter states patient does not like eating meals at the facility in [place].patient states food is " Horrible" usually eats breakfast cereal.states had a half roasted potatoes yesterday but does not understand why they served potatoes with rice.Ensure protein supplement previously recommend by PCP but daughter states patient has not drunk any of the Ensure just piled up in the room.  I have reemphasized importance of drinking ensure supplement to prevent further weight loss. Agrees to drink supplement then will recheck weight on upcoming visit with PCP Dr.Miller.     Past Medical History:  Diagnosis Date   Allergies    Anxiety    panic attacks   Aortic stenosis    Arthritis    Bronchiectasis with (acute) exacerbation (HCC)    CAD (coronary artery disease)    non obst CAD   Depression due to acute stroke (HCC)    Dizziness    Elevated liver enzymes    Fall    Fluid overload    GERD (gastroesophageal reflux disease)    Heart murmur    Hemorrhoids    History of mammogram    Hyperlipemia    Hypertension    Internal carotid artery stenosis    OCD (obsessive compulsive disorder)    Orthostatic hypotension    Other cirrhosis of liver (HCC)    PAF (paroxysmal atrial fibrillation) (HCC)    Pericardial effusion  Physical deconditioning    Renal insufficiency    Rheumatic heart failure (congestive) (HCC)    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 Lynnette Caffey & Dr. Laneta Simmers   Seasonal allergies    Severe aortic stenosis    Stroke [facility])    Takotsubo cardiomyopathy    Past Surgical History:  Procedure Laterality Date   COLONOSCOPY     EYE SURGERY Bilateral    cataracts removed   INTRAOPERATIVE TRANSTHORACIC ECHOCARDIOGRAM N/A 08/03/2021   Procedure: INTRAOPERATIVE  TRANSTHORACIC ECHOCARDIOGRAM;  Surgeon: Orbie Pyo, MD;  Location: [facility] INVASIVE CV LAB;  Service: Open Heart Surgery;  Laterality: N/A;   IR CT HEAD LTD  03/25/2022   IR PERCUTANEOUS ART THROMBECTOMY/INFUSION INTRACRANIAL INC DIAG ANGIO  03/25/2022   IR US GUIDE VASC ACCESS RIGHT  03/25/2022   PERICARDIOCENTESIS N/A 07/28/2021   Procedure: PERICARDIOCENTESIS;  Surgeon: Orbie Pyo, MD;  Location: [facility] INVASIVE CV LAB;  Service: Cardiovascular;  Laterality: N/A;   RADIOLOGY WITH ANESTHESIA N/A 03/25/2022   Procedure: IR WITH ANESTHESIA;  Surgeon: Radiologist, Medication, MD;  Location: [facility] OR;  Service: Radiology;  Laterality: N/A;   RIGHT/LEFT HEART CATH AND CORONARY ANGIOGRAPHY N/A 05/13/2019   Procedure: RIGHT/LEFT HEART CATH AND CORONARY ANGIOGRAPHY;  Surgeon: Swaziland, Peter M, MD;  Location: [facility] INVASIVE CV LAB;  Service: Cardiovascular;  Laterality: N/A;   RIGHT/LEFT HEART CATH AND CORONARY ANGIOGRAPHY N/A 07/23/2021   Procedure: RIGHT/LEFT HEART CATH AND CORONARY ANGIOGRAPHY;  Surgeon: Corky Crafts, MD;  Location: [facility] INVASIVE CV LAB;  Service: Cardiovascular;  Laterality: N/A;   TONSILLECTOMY     TRANSCATHETER AORTIC VALVE REPLACEMENT, TRANSFEMORAL N/A 08/03/2021   Procedure: TRANSCATHETER AORTIC VALVE REPLACEMENT, TRANSFEMORAL;  Surgeon: Orbie Pyo, MD;  Location: [facility] INVASIVE CV LAB;  Service: Open Heart Surgery;  Laterality: N/A;   UPPER GI ENDOSCOPY      Allergies  Allergen Reactions   Codeine Nausea And Vomiting   Cyclobenzaprine Other (See Comments)    Unknown reaction   Iodinated Contrast Media Nausea Only   Prednisone Other (See Comments)    "Sores in the mouth"    Outpatient Encounter Medications as of 06/28/2022  Medication Sig   acetaminophen (TYLENOL) 500 MG tablet Take 500-1,000 mg by mouth every 6 (six) hours as needed for moderate pain.   albuterol (VENTOLIN HFA) 108 (90 Base) MCG/ACT inhaler Inhale 2 puffs into the lungs every 4 (four) hours as needed for  wheezing or shortness of breath (seasonal allergies).   dabigatran (PRADAXA) 150 MG CAPS capsule Take 1 capsule (150 mg total) by mouth every 12 (twelve) hours.   docusate sodium (COLACE) 100 MG capsule Take 100 mg by mouth 2 (two) times daily.   escitalopram (LEXAPRO) 10 MG tablet Take 1 tablet (10 mg total) by mouth daily.   famotidine (PEPCID) 10 MG tablet Take 10 mg by mouth daily as needed for heartburn.   fexofenadine (ALLEGRA) 60 MG tablet Take 1 tablet (60 mg total) by mouth daily for 20 days.   furosemide (LASIX) 20 MG tablet Take 20 mg by mouth daily.   Iron, Ferrous Sulfate, 325 (65 Fe) MG TABS Take 325 mg by mouth 3 (three) times a week. On Monday,Wednesday and Friday   lisinopril (ZESTRIL) 5 MG tablet Take 0.5 tablets (2.5 mg total) by mouth daily.   mirtazapine (REMERON) 7.5 MG tablet Take 1 tablet (7.5 mg total) by mouth at bedtime.  montelukast (SINGULAIR) 10 MG tablet Take 1 tablet (10 mg total) by mouth daily.   Multiple Vitamins-Minerals (EMERGEN-C VITAMIN C PO) Take 1 packet by mouth daily. Mix with water   rosuvastatin (CRESTOR) 20 MG tablet Take 1 tablet (20 mg total) by mouth daily.   saccharomyces boulardii (FLORASTOR) 250 MG capsule Take 1 capsule (250 mg total) by mouth 2 (two) times daily for 10 days.   Trolamine Salicylate (ASPERCREME EX) Apply 1 application topically daily as needed (knee pain).   [DISCONTINUED] amoxicillin-clavulanate (AUGMENTIN) 875-125 MG tablet Take 1 tablet by mouth 2 (two) times daily.   No facility-administered encounter medications on file as of 06/28/2022.    Review of Systems  Constitutional:  Negative for appetite change, chills, fatigue, fever and unexpected weight change.  HENT:  Positive for hearing loss. Negative for congestion, dental problem, ear discharge, ear pain, facial swelling, nosebleeds, postnasal drip, rhinorrhea, sinus pressure, sinus pain, sneezing, sore throat and tinnitus.   Eyes:  Negative for pain, discharge,  redness, itching and visual disturbance.  Respiratory:  Negative for cough, chest tightness, shortness of breath and wheezing.   Cardiovascular:  Negative for chest pain, palpitations and leg swelling.  Gastrointestinal:  Negative for abdominal distention, abdominal pain, blood in stool, constipation, diarrhea, nausea and vomiting.  Musculoskeletal:  Positive for gait problem. Negative for arthralgias, back pain, joint swelling, myalgias, neck pain and neck stiffness.       Walks with a rolling walker   Skin:  Negative for color change, pallor and rash.  Neurological:  Negative for dizziness, syncope, speech difficulty, weakness, light-headedness, numbness and headaches.  Hematological:  Does not bruise/bleed easily.  Psychiatric/Behavioral:  Negative for agitation, behavioral problems, confusion, hallucinations, self-injury, sleep disturbance and suicidal ideas. The patient is not nervous/anxious.     Immunization History  Administered Date(s) Administered   Fluad Quad(high Dose 65+) 04/07/2019, 05/13/2022   Influenza Split 05/10/2006, 04/25/2007, 04/08/2008, 11/10/2008   Influenza, High Dose Seasonal PF 04/20/2017, 04/07/2019, 03/25/2020, 04/27/2021   Influenza-Unspecified 05/10/2006, 04/25/2007, 04/08/2008, 07/09/2008, 11/10/2008, 03/31/2015, 04/20/2018, 04/07/2019   PFIZER Comirnaty(Gray Top)Covid-19 Tri-Sucrose Vaccine 08/14/2019, 09/04/2019, 04/20/2020, 10/25/2020, 04/12/2021   PPD Test 05/12/2022   Pneumococcal Conjugate-13 09/29/2015   Pneumococcal Polysaccharide-23 03/12/2007   Tdap 07/14/2014   Zoster Recombinat (Shingrix) 12/10/2019, 02/08/2020   Zoster, Live 08/18/2004, 01/17/2007   Pertinent  Health Maintenance Due  Topic Date Due   DEXA SCAN  Never done   INFLUENZA VACCINE  Completed      05/13/2022    7:35 PM 05/14/2022    9:45 AM 05/14/2022    8:40 PM 06/22/2022    1:33 PM 06/28/2022    1:36 PM  Fall Risk  Falls in the past year?    0 1  Was there an injury with  Fall?    0 1  Fall Risk Category Calculator    0 2  Fall Risk Category    Low Moderate  Patient Fall Risk Level High fall risk High fall risk High fall risk Low fall risk Moderate fall risk  Patient at Risk for Falls Due to    No Fall Risks History of fall(s);Impaired balance/gait;Impaired mobility  Fall risk Follow up    Falls evaluation completed Falls evaluation completed;Education provided;Falls prevention discussed   Functional Status Survey:    Vitals:   06/28/22 1328  BP: 120/70  Pulse: 98  Resp: 16  Temp: 98.1 F (36.7 C)  SpO2: 93%  Weight: 114 lb 12.8 oz (52.1 kg)  Height: 5\' 1"  (  1.549 m)   Body mass index is 21.69 kg/m. Physical Exam Vitals reviewed.  Constitutional:      General: She is not in acute distress.    Appearance: Normal appearance. She is normal weight. She is not ill-appearing or diaphoretic.  HENT:     Head: Normocephalic.     Right Ear: There is impacted cerumen.     Left Ear: Tympanic membrane, ear canal and external ear normal. There is no impacted cerumen.     Ears:     Comments: Right ear cerumen lavaged with warm water and hydrogen peroxide moderate amounts of cerumen obtained.Tolerated procedure well.No instrument used.TM clear without any signs of infection.     Nose: Nose normal. No congestion or rhinorrhea.     Mouth/Throat:     Mouth: Mucous membranes are moist.     Pharynx: Oropharynx is clear. No oropharyngeal exudate or posterior oropharyngeal erythema.  Eyes:     General: No scleral icterus.       Right eye: No discharge.        Left eye: No discharge.     Conjunctiva/sclera: Conjunctivae normal.     Pupils: Pupils are equal, round, and reactive to light.     Comments: Corrective lens in place  Neck:     Vascular: No carotid bruit.  Cardiovascular:     Rate and Rhythm: Normal rate and regular rhythm.     Pulses: Normal pulses.     Heart sounds: Normal heart sounds. No murmur heard.    No friction rub. No gallop.  Pulmonary:      Effort: Pulmonary effort is normal. No respiratory distress.     Breath sounds: Normal breath sounds. No wheezing, rhonchi or rales.  Chest:     Chest wall: No tenderness.  Abdominal:     General: Bowel sounds are normal. There is no distension.     Palpations: Abdomen is soft. There is no mass.     Tenderness: There is no abdominal tenderness. There is no right CVA tenderness, left CVA tenderness, guarding or rebound.  Musculoskeletal:        General: No swelling or tenderness. Normal range of motion.     Cervical back: Normal range of motion. No rigidity or tenderness.     Right lower leg: No edema.     Left lower leg: No edema.  Lymphadenopathy:     Cervical: No cervical adenopathy.  Skin:    General: Skin is warm and dry.     Coloration: Skin is not pale.     Findings: No bruising, erythema, lesion or rash.  Neurological:     Mental Status: She is alert. Mental status is at baseline.     Cranial Nerves: No cranial nerve deficit.     Sensory: No sensory deficit.     Motor: No weakness.     Coordination: Coordination normal.     Gait: Gait abnormal.  Psychiatric:        Mood and Affect: Mood normal.        Speech: Speech normal.        Behavior: Behavior normal.     Labs reviewed: Recent Labs    07/22/21 0022 07/22/21 0421 07/23/21 0745 08/04/21 0452 10/26/21 1625 05/11/22 0958 05/13/22 0358 05/19/22 1517  NA  --  131*   < > 133*   < > 134* 134* 136  K  --  3.9   < > 4.4   < > 3.5 3.5 4.6  CL  --  93*   < > 101   < > 100 100 102  CO2  --  28   < > 22   < > 27 25 25   GLUCOSE  --  109*   < > 134*   < > 158* 103* 87  BUN  --  21   < > 29*   < > 12 15 14   CREATININE  --  1.09*   < > 1.15*   < > 0.99 1.02* 0.95  CALCIUM  --  9.1   < > 8.9   < > 9.1 9.1 9.3  MG  --  2.2  --  2.1  --   --  2.1  --   PHOS 3.0 3.4  --   --   --   --   --   --    < > = values in this interval not displayed.   Recent Labs    05/10/22 1532 05/11/22 0958 05/13/22 0358  05/19/22 1517  AST 53* 50* 38 48*  ALT 30 26 22 23   ALKPHOS 53 47 41  --   BILITOT 1.5* 1.7* 1.3* 1.2  PROT 7.7 6.1* 5.6* 6.4  ALBUMIN 4.3 3.3* 3.1*  --    Recent Labs    03/25/22 1059 03/25/22 1105 05/10/22 1532 05/12/22 0311 05/19/22 1517  WBC 5.7   < > 8.0 7.4 6.1  NEUTROABS 2.9  --  5.7  --  3,129  HGB 10.6*   < > 10.8* 8.8* 9.6*  HCT 32.5*   < > 34.2* 27.0* 30.2*  MCV 90.0   < > 91.7 90.6 92.1  PLT 245   < > 280 216 269   < > = values in this interval not displayed.   Lab Results  Component Value Date   TSH 0.623 05/10/2022   Lab Results  Component Value Date   HGBA1C 5.4 03/26/2022   Lab Results  Component Value Date   CHOL 204 (H) 03/26/2022   HDL 86 03/26/2022   LDLCALC 103 (H) 03/26/2022   TRIG 77 03/26/2022   CHOLHDL 2.4 03/26/2022    Significant Diagnostic Results in last 30 days:  LONG TERM MONITOR-LIVE TELEMETRY (3-14 DAYS)  Result Date: 06/07/2022   Nromal sinus rhythm   Rare PVCs with bigeminy   Occasional PACs with short runs of SVT   On October 27 there were 2 episoded of SVT c/w atrial flutter at 2:48 and 5:42 pm. longest lasting 8 minutes and 24 seconds. Patch Wear Time:  13 days and 21 hours (2023-10-20T13:40:10-398 to 2023-11-03T11:06:28-0400) Patient had a min HR of 61 bpm, max HR of 211 bpm, and avg HR of 90 bpm. Predominant underlying rhythm was Sinus Rhythm. QRS morphology changes were present throughout recording. 53 Supraventricular Tachycardia runs occurred, the run with the fastest interval lasting 6 beats with a max rate of 211 bpm, the longest lasting 8 mins 24 secs with an avg rate of 158 bpm. Some episodes of Supraventricular Tachycardia may be possible Atrial Tachycardia with variable block. Isolated SVEs were occasional (1.6%, 28180), SVE Couplets were rare (<1.0%, 955), and SVE Triplets were rare (<1.0%, 127). Isolated VEs were rare (<1.0%, 12422), VE Couplets were rare (<1.0%, 213), and VE Triplets were rare (<1.0%, 63). Ventricular  Bigeminy and Trigeminy were present.    Assessment/Plan  1. Right ear impacted cerumen Right ear cerumen lavaged with warm water and hydrogen peroxide moderate amounts of cerumen obtained.Tolerated procedure well.No instrument used.TM clear without any signs of  infection.  2. Weight loss, abnormal Has had progressive weight loss though eating only one meal at the facility.Ensure ordered on previous visit but has not been drinking it per daughter. Importance of protein supplement emphasized this visit.then follow up with PCP as scheduled to recheck weight.skipping meals could be a contributory factor in feeling " Bad" sometimes in the morning though could be also her ongoing heart issues.Does have an ECHO ordered to be done in January,2024 with Cardiologist.Daughter will contact cardiologist concerning her symptoms.   Family/ staff Communication: Reviewed plan of care with patient and daughter verbalized understanding.   Labs/tests ordered: None   Next Appointment: Return if symptoms worsen or fail to improve.   Caesar Bookman, NP

## 2022-07-01 ENCOUNTER — Other Ambulatory Visit: Payer: Self-pay | Admitting: Family Medicine

## 2022-07-01 NOTE — Telephone Encounter (Signed)
Pharmacy requested refill.  Pended Rx and sent to [facility] for approval. (Dr. Hyacinth Meeker out of office)

## 2022-07-04 ENCOUNTER — Encounter: Payer: Self-pay | Admitting: Family Medicine

## 2022-07-04 ENCOUNTER — Encounter: Payer: Self-pay | Admitting: Cardiology

## 2022-07-05 ENCOUNTER — Telehealth: Payer: Self-pay | Admitting: Cardiology

## 2022-07-05 NOTE — Telephone Encounter (Signed)
[name], daughter called and stated that the Dentist is wanting to get patient in tomorrow to fix. Patient is on Pradaxa and Heart Medications and wants to know if she is cleared for them to fix patient's crowns tomorrow.   She also called Cardiologist and they told her that she needed to receive medical clearance from them. So the triage nurse from the dentist is going to call the Cardiologist office.   Patient going to Cardiologist for Medical Clearance.

## 2022-07-05 NOTE — Telephone Encounter (Signed)
   Pre-operative Risk Assessment    Patient Name: Barbara Mcconnell  DOB: 06/30/1935 MRN: [id number]     Request for Surgical Clearance    Procedure:   Crowns  Date of Surgery:  Clearance 07/06/22                                 Surgeon:  Dr. Revonda Humphrey Surgeon's Group or Practice Name:  [facility] Phone number:  [phone] Fax number:  [phone]   Type of Clearance Requested:   - Medical    Type of Anesthesia:  None    Additional requests/questions:  Does this patient need antibiotics?  Signed, Belisicia T Tiburcio Pea   07/05/2022, 11:06 AM

## 2022-07-06 NOTE — Telephone Encounter (Signed)
   Patient Name: Barbara Mcconnell  DOB: 09-May-1935 MRN: [id number]  Primary Cardiologist: Peter [place], MD  Chart reviewed as part of pre-operative protocol coverage.   Simple dental extractions (i.e. 1-2 teeth) are considered low risk procedures per guidelines and generally do not require any specific cardiac clearance. It is also generally accepted that for simple extractions and dental cleanings, there is no need to interrupt blood thinner therapy.   SBE prophylaxis is required for the patient from a cardiac standpoint.  I will route this recommendation to the requesting party via Epic fax function and remove from pre-op pool.  Please call with questions.  Alver Sorrow, NP 07/06/2022, 9:47 AM

## 2022-07-07 ENCOUNTER — Other Ambulatory Visit: Payer: Self-pay

## 2022-07-07 NOTE — Patient Outreach (Signed)
Telephone outreach to patient's daughter to obtain mRS was successfully completed. MRS= 3  Almando Brawley [facility] Care Management Assistant [phone]  

## 2022-07-12 ENCOUNTER — Ambulatory Visit: Payer: Medicare HMO | Admitting: Cardiology

## 2022-07-13 ENCOUNTER — Ambulatory Visit: Payer: Medicare HMO | Admitting: Family Medicine

## 2022-07-19 ENCOUNTER — Telehealth: Payer: Self-pay | Admitting: *Deleted

## 2022-07-19 ENCOUNTER — Telehealth: Payer: Self-pay

## 2022-07-19 DIAGNOSIS — R0982 Postnasal drip: Secondary | ICD-10-CM

## 2022-07-19 MED ORDER — FEXOFENADINE HCL 60 MG PO TABS: 60.0000 mg | ORAL_TABLET | Freq: Every day | ORAL | 0 refills | Status: DC

## 2022-07-19 NOTE — Telephone Encounter (Signed)
Barbara Mcconnell, ST with [facility] called requesting verbal orders to continue Speech Therapy 1x2weeks and 2X6weeks.   Verbal orders given.

## 2022-07-19 NOTE — Telephone Encounter (Signed)
Patient's daughter [name]) called requesting a refill on Allegra and medication was send into pharmacy.

## 2022-07-20 ENCOUNTER — Ambulatory Visit: Payer: Medicare HMO | Admitting: Family Medicine

## 2022-07-29 DIAGNOSIS — F331 Major depressive disorder, recurrent, moderate: Secondary | ICD-10-CM | POA: Diagnosis not present

## 2022-08-01 ENCOUNTER — Ambulatory Visit: Payer: Medicare HMO

## 2022-08-01 ENCOUNTER — Other Ambulatory Visit (HOSPITAL_COMMUNITY): Payer: Medicare HMO

## 2022-08-03 ENCOUNTER — Ambulatory Visit: Payer: Medicare HMO | Admitting: Family Medicine

## 2022-08-08 ENCOUNTER — Ambulatory Visit (INDEPENDENT_AMBULATORY_CARE_PROVIDER_SITE_OTHER): Payer: Medicare Other | Admitting: Adult Health

## 2022-08-08 ENCOUNTER — Encounter: Payer: Self-pay | Admitting: Adult Health

## 2022-08-08 VITALS — BP 120/68 | HR 92 | Temp 97.8°F | Resp 18 | Ht 61.0 in | Wt 114.2 lb

## 2022-08-08 DIAGNOSIS — D649 Anemia, unspecified: Secondary | ICD-10-CM

## 2022-08-08 DIAGNOSIS — R630 Anorexia: Secondary | ICD-10-CM | POA: Diagnosis not present

## 2022-08-08 DIAGNOSIS — D509 Iron deficiency anemia, unspecified: Secondary | ICD-10-CM | POA: Diagnosis not present

## 2022-08-08 DIAGNOSIS — F419 Anxiety disorder, unspecified: Secondary | ICD-10-CM | POA: Diagnosis not present

## 2022-08-08 DIAGNOSIS — I480 Paroxysmal atrial fibrillation: Secondary | ICD-10-CM | POA: Diagnosis not present

## 2022-08-08 DIAGNOSIS — I5032 Chronic diastolic (congestive) heart failure: Secondary | ICD-10-CM | POA: Diagnosis not present

## 2022-08-08 DIAGNOSIS — F339 Major depressive disorder, recurrent, unspecified: Secondary | ICD-10-CM | POA: Diagnosis not present

## 2022-08-08 DIAGNOSIS — I10 Essential (primary) hypertension: Secondary | ICD-10-CM

## 2022-08-08 DIAGNOSIS — F331 Major depressive disorder, recurrent, moderate: Secondary | ICD-10-CM | POA: Diagnosis not present

## 2022-08-08 DIAGNOSIS — Z8673 Personal history of transient ischemic attack (TIA), and cerebral infarction without residual deficits: Secondary | ICD-10-CM

## 2022-08-08 NOTE — Progress Notes (Signed)
[facility] clinic  Provider:  Kenard Gower DNP  Code Status:   Full Code  Goals of Care:     06/28/2022    1:36 PM  Advanced Directives  Does Patient Have a Medical Advance Directive? Yes  Type of Estate agent of [place];Living will  Does patient want to make changes to medical advance directive? No - Patient declined  Copy of Healthcare Power of Attorney in Chart? Yes - validated most recent copy scanned in chart (See row information)     Chief Complaint  Patient presents with   Medical Management of Chronic Issues    Patient is here for follow up and lab work    HPI: Patient is a 88 y.o. female seen medical management of chronic issues. She was accompanied today by her daughter, Patient is not happy with her current living placement, ALF. She does not participate much with the social activities at [facility]. She has low energy and feels fatigued. Mirtazapine was recently increased to 15 mg at bedtime to increase her appetite. She denies being sleepy. She said that her mood is better but still not enthusiastic. She takes Lexapro 10 mg daily for depression. Today, BP 120/68, takes Lisinopril for hypertension.  Wt Readings from Last 3 Encounters:  08/08/22 114 lb 3.2 oz (51.8 kg)  06/28/22 114 lb 12.8 oz (52.1 kg)  06/22/22 114 lb 9.6 oz (52 kg)     Past Medical History:  Diagnosis Date   Allergies    Anxiety    panic attacks   Aortic stenosis    Arthritis    Bronchiectasis with (acute) exacerbation (HCC)    CAD (coronary artery disease)    non obst CAD   Depression due to acute stroke (HCC)    Dizziness    Elevated liver enzymes    Fall    Fluid overload    GERD (gastroesophageal reflux disease)    Heart murmur    Hemorrhoids    History of mammogram    Hyperlipemia    Hypertension    Internal carotid artery stenosis    OCD (obsessive compulsive disorder)    Orthostatic hypotension    Other cirrhosis of liver (HCC)    PAF  (paroxysmal atrial fibrillation) (HCC)    Pericardial effusion    Physical deconditioning    Renal insufficiency    Rheumatic heart failure (congestive) (HCC)    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 Lynnette Caffey & Dr. Laneta Simmers   Seasonal allergies    Severe aortic stenosis    Stroke [facility])    Takotsubo cardiomyopathy     Past Surgical History:  Procedure Laterality Date   COLONOSCOPY     EYE SURGERY Bilateral    cataracts removed   INTRAOPERATIVE TRANSTHORACIC ECHOCARDIOGRAM N/A 08/03/2021   Procedure: INTRAOPERATIVE TRANSTHORACIC ECHOCARDIOGRAM;  Surgeon: Orbie Pyo, MD;  Location: [facility] INVASIVE CV LAB;  Service: Open Heart Surgery;  Laterality: N/A;   IR CT HEAD LTD  03/25/2022   IR PERCUTANEOUS ART THROMBECTOMY/INFUSION INTRACRANIAL INC DIAG ANGIO  03/25/2022   IR US GUIDE VASC ACCESS RIGHT  03/25/2022   PERICARDIOCENTESIS N/A 07/28/2021   Procedure: PERICARDIOCENTESIS;  Surgeon: Orbie Pyo, MD;  Location: [facility] INVASIVE CV LAB;  Service: Cardiovascular;  Laterality: N/A;   RADIOLOGY WITH ANESTHESIA N/A 03/25/2022   Procedure: IR WITH ANESTHESIA;  Surgeon: Radiologist, Medication, MD;  Location: [facility] OR;  Service:  Radiology;  Laterality: N/A;   RIGHT/LEFT HEART CATH AND CORONARY ANGIOGRAPHY N/A 05/13/2019   Procedure: RIGHT/LEFT HEART CATH AND CORONARY ANGIOGRAPHY;  Surgeon: Martinique, Peter M, MD;  Location: [place] CV LAB;  Service: Cardiovascular;  Laterality: N/A;   RIGHT/LEFT HEART CATH AND CORONARY ANGIOGRAPHY N/A 07/23/2021   Procedure: RIGHT/LEFT HEART CATH AND CORONARY ANGIOGRAPHY;  Surgeon: Jettie Booze, MD;  Location: [place] CV LAB;  Service: Cardiovascular;  Laterality: N/A;   TONSILLECTOMY     TRANSCATHETER AORTIC VALVE REPLACEMENT, TRANSFEMORAL N/A 08/03/2021   Procedure: TRANSCATHETER AORTIC VALVE REPLACEMENT, TRANSFEMORAL;  Surgeon: Early Osmond, MD;  Location: [place]  CV LAB;  Service: Open Heart Surgery;  Laterality: N/A;   UPPER GI ENDOSCOPY      Allergies  Allergen Reactions   Codeine Nausea And Vomiting   Cyclobenzaprine Other (See Comments)    Unknown reaction   Iodinated Contrast Media Nausea Only   Prednisone Other (See Comments)    "Sores in the mouth"    Outpatient Encounter Medications as of 08/08/2022  Medication Sig   acetaminophen (TYLENOL) 500 MG tablet Take 500-1,000 mg by mouth every 6 (six) hours as needed for moderate pain.   albuterol (VENTOLIN HFA) 108 (90 Base) MCG/ACT inhaler Inhale 2 puffs into the lungs every 4 (four) hours as needed for wheezing or shortness of breath (seasonal allergies).   dabigatran (PRADAXA) 150 MG CAPS capsule Take 1 capsule (150 mg total) by mouth every 12 (twelve) hours.   docusate sodium (COLACE) 100 MG capsule Take 100 mg by mouth 2 (two) times daily.   escitalopram (LEXAPRO) 10 MG tablet Take 1 tablet (10 mg total) by mouth daily.   famotidine (PEPCID) 10 MG tablet Take 10 mg by mouth daily as needed for heartburn.   fexofenadine (ALLEGRA) 60 MG tablet Take 1 tablet (60 mg total) by mouth daily.   furosemide (LASIX) 20 MG tablet TAKE (1) TABLET BY MOUTH ONCE DAILY IN THE MORNING.   Iron, Ferrous Sulfate, 325 (65 Fe) MG TABS Take 325 mg by mouth 3 (three) times a week. On Monday,Wednesday and Friday   lisinopril (ZESTRIL) 5 MG tablet Take 0.5 tablets (2.5 mg total) by mouth daily.   mirtazapine (REMERON) 7.5 MG tablet Take 1 tablet (7.5 mg total) by mouth at bedtime. (Patient taking differently: Take 15 mg by mouth at bedtime.)   montelukast (SINGULAIR) 10 MG tablet Take 1 tablet (10 mg total) by mouth daily.   Multiple Vitamins-Minerals (EMERGEN-C VITAMIN C PO) Take 1 packet by mouth daily. Mix with water   rosuvastatin (CRESTOR) 20 MG tablet Take 1 tablet (20 mg total) by mouth daily.   Trolamine Salicylate (ASPERCREME EX) Apply 1 application topically daily as needed (knee pain).   No  facility-administered encounter medications on file as of 08/08/2022.    Review of Systems:  Review of Systems  Constitutional:  Positive for fatigue. Negative for appetite change, chills and fever.  HENT:  Negative for congestion, hearing loss, rhinorrhea and sore throat.   Eyes: Negative.   Respiratory:  Negative for cough, shortness of breath and wheezing.   Cardiovascular:  Negative for chest pain, palpitations and leg swelling.  Gastrointestinal:  Negative for abdominal pain, constipation, diarrhea, nausea and vomiting.  Genitourinary:  Negative for dysuria.  Musculoskeletal:  Negative for arthralgias, back pain and myalgias.  Skin:  Negative for color change, rash and wound.  Neurological:  Negative for dizziness, weakness and headaches.  Psychiatric/Behavioral:  Negative for behavioral problems. The patient  is not nervous/anxious.     Health Maintenance  Topic Date Due   DEXA SCAN  Never done   COVID-19 Vaccine (6 - 2023-24 season) 03/25/2022   Medicare Annual Wellness (AWV)  06/15/2022   DTaP/Tdap/Td (2 - Td or Tdap) 07/14/2024   Pneumonia Vaccine 77+ Years old  Completed   INFLUENZA VACCINE  Completed   Zoster Vaccines- Shingrix  Completed   HPV VACCINES  Aged Out    Physical Exam: Vitals:   08/08/22 1328  BP: 120/68  Pulse: 92  Resp: 18  Temp: 97.8 F (36.6 C)  SpO2: 94%  Weight: 114 lb 3.2 oz (51.8 kg)  Height: 5\' 1"  (1.549 m)   Body mass index is 21.58 kg/m. Physical Exam Constitutional:      Appearance: Normal appearance.  HENT:     Head: Normocephalic and atraumatic.     Nose: Nose normal.     Mouth/Throat:     Mouth: Mucous membranes are moist.  Eyes:     Conjunctiva/sclera: Conjunctivae normal.  Cardiovascular:     Rate and Rhythm: Normal rate and regular rhythm.  Pulmonary:     Effort: Pulmonary effort is normal.     Breath sounds: Normal breath sounds.  Abdominal:     General: Bowel sounds are normal.     Palpations: Abdomen is soft.   Musculoskeletal:        General: Swelling present. Normal range of motion.     Cervical back: Normal range of motion.     Right lower leg: Edema present.     Left lower leg: Edema present.     Comments: BLE 1+edema  Skin:    General: Skin is warm and dry.  Neurological:     General: No focal deficit present.     Mental Status: She is alert and oriented to person, place, and time.  Psychiatric:        Mood and Affect: Mood normal.        Behavior: Behavior normal.        Thought Content: Thought content normal.        Judgment: Judgment normal.     Labs reviewed: Basic Metabolic Panel: Recent Labs    02/15/22 1119 03/25/22 1059 05/10/22 1532 05/11/22 0958 05/13/22 0358 05/19/22 1517  NA 136   < > 133* 134* 134* 136  K 4.4   < > 4.1 3.5 3.5 4.6  CL 99   < > 98 100 100 102  CO2 31   < > 28 27 25 25   GLUCOSE 87   < > 92 158* 103* 87  BUN 20   < > 19 12 15 14   CREATININE 0.89   < > 0.96 0.99 1.02* 0.95  CALCIUM 9.5   < > 10.0 9.1 9.1 9.3  MG  --   --   --   --  2.1  --   TSH 1.33  --  0.623  --   --   --    < > = values in this interval not displayed.   Liver Function Tests: Recent Labs    05/10/22 1532 05/11/22 0958 05/13/22 0358 05/19/22 1517  AST 53* 50* 38 48*  ALT 30 26 22 23   ALKPHOS 53 47 41  --   BILITOT 1.5* 1.7* 1.3* 1.2  PROT 7.7 6.1* 5.6* 6.4  ALBUMIN 4.3 3.3* 3.1*  --    No results for input(s): "LIPASE", "AMYLASE" in the last 8760 hours. Recent Labs  05/10/22 1532 05/19/22 1517  AMMONIA 19 <10   CBC: Recent Labs    03/25/22 1059 03/25/22 1105 05/10/22 1532 05/12/22 0311 05/19/22 1517  WBC 5.7   < > 8.0 7.4 6.1  NEUTROABS 2.9  --  5.7  --  3,129  HGB 10.6*   < > 10.8* 8.8* 9.6*  HCT 32.5*   < > 34.2* 27.0* 30.2*  MCV 90.0   < > 91.7 90.6 92.1  PLT 245   < > 280 216 269   < > = values in this interval not displayed.   Lipid Panel: Recent Labs    02/15/22 1119 03/26/22 0137  CHOL 204* 204*  HDL 85 86  LDLCALC 95 103*   TRIG 137 77  CHOLHDL 2.4 2.4   Lab Results  Component Value Date   HGBA1C 5.4 03/26/2022    Procedures since last visit: No results found.  Assessment/Plan  1. Iron deficiency anemia -  last hgb 9.6, 05/19/22 -  continue FeSO4 -  check CBC  2. Poor appetite -  Mirtazapine was recently increased to 15 mg Q HS  3. Major depression, recurrent, chronic (HCC) -  mood better but not enthusiastic per daughter -  continue Lexapro  4. Chronic heart failure with preserved ejection fraction (HCC) -  no SOB -  continue Lasix -  declined using compression socks - CMP with eGFR([facility])  5. PAF (paroxysmal atrial fibrillation) (HCC) -  rate-controlled -  continue Pradaxa for anticoagulation - CBC With Differential/Platelet  6. Primary hypertension -  BP 120/68, stable -  continue Lisinopril  7. History of CVA (cerebrovascular accident) -  stable -  participates in PT sessions at ALF -  continue Rosuvastatin and Pradaxa    Labs/tests ordered:  CBC and BMP  Next appt:  in 3 months

## 2022-08-08 NOTE — Patient Instructions (Signed)

## 2022-08-09 LAB — CBC WITH DIFFERENTIAL/PLATELET
Absolute Monocytes: 441 cells/uL (ref 200–950)
Basophils Absolute: 50 cells/uL (ref 0–200)
Basophils Relative: 0.8 %
Eosinophils Absolute: 239 cells/uL (ref 15–500)
Eosinophils Relative: 3.8 %
HCT: 27.8 % — ABNORMAL LOW (ref 35.0–45.0)
Hemoglobin: 8.9 g/dL — ABNORMAL LOW (ref 11.7–15.5)
Lymphs Abs: 2048 cells/uL (ref 850–3900)
MCH: 28.3 pg (ref 27.0–33.0)
MCHC: 32 g/dL (ref 32.0–36.0)
MCV: 88.5 fL (ref 80.0–100.0)
MPV: 12.1 fL (ref 7.5–12.5)
Monocytes Relative: 7 %
Neutro Abs: 3522 cells/uL (ref 1500–7800)
Neutrophils Relative %: 55.9 %
Platelets: 273 10*3/uL (ref 140–400)
RBC: 3.14 10*6/uL — ABNORMAL LOW (ref 3.80–5.10)
RDW: 13.8 % (ref 11.0–15.0)
Total Lymphocyte: 32.5 %
WBC: 6.3 10*3/uL (ref 3.8–10.8)

## 2022-08-09 LAB — COMPLETE METABOLIC PANEL WITH GFR
AG Ratio: 1.3 (calc) (ref 1.0–2.5)
ALT: 19 U/L (ref 6–29)
AST: 45 U/L — ABNORMAL HIGH (ref 10–35)
Albumin: 3.6 g/dL (ref 3.6–5.1)
Alkaline phosphatase (APISO): 54 U/L (ref 37–153)
BUN/Creatinine Ratio: 13 (calc) (ref 6–22)
BUN: 16 mg/dL (ref 7–25)
CO2: 26 mmol/L (ref 20–32)
Calcium: 9.4 mg/dL (ref 8.6–10.4)
Chloride: 104 mmol/L (ref 98–110)
Creat: 1.24 mg/dL — ABNORMAL HIGH (ref 0.60–0.95)
Globulin: 2.7 g/dL (calc) (ref 1.9–3.7)
Glucose, Bld: 87 mg/dL (ref 65–139)
Potassium: 4.5 mmol/L (ref 3.5–5.3)
Sodium: 138 mmol/L (ref 135–146)
Total Bilirubin: 1 mg/dL (ref 0.2–1.2)
Total Protein: 6.3 g/dL (ref 6.1–8.1)
eGFR: 43 mL/min/{1.73_m2} — ABNORMAL LOW (ref >=60)

## 2022-08-09 NOTE — Progress Notes (Signed)
[place]                                     Cardiology Office Note:    Date:  08/11/2022   ID:  Barbara Mcconnell, DOB 1935/03/24, MRN [id number]  PCP:  Wardell Honour, MD  [facility] Cardiologist:  Peter Martinique, MD/ Dr. Ali Lowe, MD & Dr. Cyndia Bent, MD (TAVR) [facility] Electrophysiologist:  None   Referring MD: Wardell Honour, MD   1 year s/p TAVR   History of Present Illness:    Barbara Mcconnell is a 88 y.o. female with a hx of HTN, HLD, PVD with moderate carotid artery stenosis, PAF on Pradaxa, CVA (03/2022), carotid artery stenosis, hx of Takotsubo cardiomyopathy with LV normalization, pericardial effusion requiring pericardial drain 07/2021, and severe aortic stenosis s/p TAVR (08/03/21) who presents to clinic for follow up  She was initially seen by Dr. Martinique after presenting to [facility] 05/13/2019 with chest pain. She had previously been hospitalized about 5 years prior and was told that she had Takotsubo cardiomyopathy with mild non-obstructive CAD which had been treated medically with normalization of LV function. She had been doing very well but developed sub-sternal chest pain with weakness and diaphoresis and presented to [facility] 04/2019 for further evaluation, found to have non-obstructive CAD per cardiac catheterization with severe LV dysfunction at 35-40% with wall motion abnormalities consistent with Takotsubo's cardiomyopathy. Echocardiogram 05/14/19 confirmed EF at 35-40% with moderate AS with a mean gradient at 21.5 mmHg, peak gradient at 44.7 mmHg, and AVA by VTI measuring 2.64 cm. Her medications were adjusted and she was seen in cardiology follow up several times in 2020 however was lost to follow up until admission 07/2021. It appears that she had a follow up echocardiogram 09/2019 which showed normalized LV function to 60-65% with no wall motion abnormalities moderate to severe aortic valve stenosis with AVA at 0.85 cm, mean  gradient measures 20.5 mmHg and peak gradient at 31.39mmHg.    She was admitted to [facility] 07/21/21-07/30/21 for acute dyspnea. Echo showed normal LVEF with a moderate circumfrential pericardial effusion measuring 1.5 cm. There was moderate AS with a mean gradient at 54mmHg (was 20.75mmhg), AVA 1.38cm and DI 0.29. She underwent [facility] 07/23/21 which showed stable, mild to moderate CAD with hemodynamics consistent with pulmonary hypertension. Due to moderate AS, she was initially felt to be a good PROGRESS trial candidate however with discrepancies between echocardiogram findings and inability to cross AV, pre-TAVR CT imaging was obtained. These showed an AVA 1.01cm2, annular area measuring 463mm2, and large circumferential pericardial effusion. After further discussion, she opted to defer PROGRESS trail consideration and move forward with TAVR given progressive symptoms, LVH with small LV cavity and calcium score at 2093. Her hospital stay was further complicated by new onset atrial fibrillation with RVR. She was placed on diltiazem infusion with relatively quick conversion to NSR. She ultimately underwent pericardiocentesis 07/28/21 and drain placement. This was discontinued 07/29/21 and the patient was discharged from the hospital 07/30/21. During her admission, she was evaluated by our multidisciplinary valve team and plans were made for TAVR on 08/03/21.   She underwent successful TAVR with a 23 mm Edwards Sapien 3 Resilia via the TF approach on 08/03/21. Post operative echo showed LVEF at 65-70% with G1DD, mild MR, mild MS, trivial PVL in the right coronary ostium with mild MR with  a mean gradient at 10.38mmHg. ECG with NSR and frequent PVCs however no high grade heart block. She was initially started on ASA 81mg  QD however this was changed to Eliquis 2.5mg  BID after discharge due to AF on ZIO monitor. 1 month echo showed EF >75% , normally functioning TAVR with a mean gradient of 9.5 mm hg and color flow c/w small VSD vs  PVL. TEE not felt to be necessary.  She was admitted 9/1-03/30/22 with acute left MCA stroke. She had difficulty with word finding and confusion. She was on Eliquis. Had IR intervention of M2 segment with apparent reocclusion of M3 segment. Echo was stable. CT showed severe stenosis at the origin of the RICA. No significant stenosis on the left. (Prior dopplers in Feb showed 50-69% RICA stenosis). She was switched from Eliquis to Pradaxa. Echo 03/26/22 showed EF 65%, normally functioning TAVR with mean gradient of 11 mm hg and mild PVL. She was discharged to a SNF. She did fall in 04/2022. Was found to be orthostatic so metoprolol was held.   Today the patient presents to clinic for follow up. Here with daughter. She is limited by aphasia but very clear that she doesn't want to be in rehab anymore and very frustrated with her stroke recovery. She wants her independence back. Daughter said she has been deemed until to live back independently due to mobility issues and aphagia. She has difficulty speaking and reading but has made tremendous improvement. She had a lot of fatigue after stroke but this seems to be improving. No Le edema. Does not have palpations, chest pain or SOB. Walks with a walker.    Past Medical History:  Diagnosis Date   Allergies    Anxiety    panic attacks   Aortic stenosis    Arthritis    Bronchiectasis with (acute) exacerbation (HCC)    CAD (coronary artery disease)    non obst CAD   Depression due to acute stroke (HCC)    Dizziness    Elevated liver enzymes    Fall    Fluid overload    GERD (gastroesophageal reflux disease)    Heart murmur    Hemorrhoids    History of mammogram    Hyperlipemia    Hypertension    Internal carotid artery stenosis    OCD (obsessive compulsive disorder)    Orthostatic hypotension    Other cirrhosis of liver (HCC)    PAF (paroxysmal atrial fibrillation) (HCC)    Pericardial effusion    Physical deconditioning    Renal insufficiency     Rheumatic heart failure (congestive) (HCC)    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 10-28-1972 & Dr. Lynnette Caffey   Seasonal allergies    Severe aortic stenosis    Stroke [facility])    Takotsubo cardiomyopathy     Past Surgical History:  Procedure Laterality Date   COLONOSCOPY     EYE SURGERY Bilateral    cataracts removed   INTRAOPERATIVE TRANSTHORACIC ECHOCARDIOGRAM N/A 08/03/2021   Procedure: INTRAOPERATIVE TRANSTHORACIC ECHOCARDIOGRAM;  Surgeon: 10/01/2021, MD;  Location: [facility] INVASIVE CV LAB;  Service: Open Heart Surgery;  Laterality: N/A;   IR CT HEAD LTD  03/25/2022   IR PERCUTANEOUS ART THROMBECTOMY/INFUSION INTRACRANIAL INC DIAG ANGIO  03/25/2022   IR 05/25/2022 GUIDE VASC ACCESS RIGHT  03/25/2022   PERICARDIOCENTESIS N/A 07/28/2021   Procedure: PERICARDIOCENTESIS;  Surgeon: 09/25/2021, MD;  Location: [facility] INVASIVE CV LAB;  Service: Cardiovascular;  Laterality: N/A;   RADIOLOGY WITH ANESTHESIA N/A 03/25/2022   Procedure: IR WITH ANESTHESIA;  Surgeon: Radiologist, Medication, MD;  Location: [facility] OR;  Service: Radiology;  Laterality: N/A;   RIGHT/LEFT HEART CATH AND CORONARY ANGIOGRAPHY N/A 05/13/2019   Procedure: RIGHT/LEFT HEART CATH AND CORONARY ANGIOGRAPHY;  Surgeon: Swaziland, Peter M, MD;  Location: [facility] INVASIVE CV LAB;  Service: Cardiovascular;  Laterality: N/A;   RIGHT/LEFT HEART CATH AND CORONARY ANGIOGRAPHY N/A 07/23/2021   Procedure: RIGHT/LEFT HEART CATH AND CORONARY ANGIOGRAPHY;  Surgeon: Corky Crafts, MD;  Location: [facility] INVASIVE CV LAB;  Service: Cardiovascular;  Laterality: N/A;   TONSILLECTOMY     TRANSCATHETER AORTIC VALVE REPLACEMENT, TRANSFEMORAL N/A 08/03/2021   Procedure: TRANSCATHETER AORTIC VALVE REPLACEMENT, TRANSFEMORAL;  Surgeon: Orbie Pyo, MD;  Location: [facility] INVASIVE CV LAB;  Service: Open Heart Surgery;  Laterality: N/A;   UPPER GI ENDOSCOPY      Current Medications: Current  Meds  Medication Sig   acetaminophen (TYLENOL) 500 MG tablet Take 500-1,000 mg by mouth every 6 (six) hours as needed for moderate pain.   albuterol (VENTOLIN HFA) 108 (90 Base) MCG/ACT inhaler Inhale 2 puffs into the lungs every 4 (four) hours as needed for wheezing or shortness of breath (seasonal allergies).   dabigatran (PRADAXA) 150 MG CAPS capsule Take 1 capsule (150 mg total) by mouth every 12 (twelve) hours.   docusate sodium (COLACE) 100 MG capsule Take 100 mg by mouth 2 (two) times daily.   escitalopram (LEXAPRO) 10 MG tablet Take 1 tablet (10 mg total) by mouth daily.   famotidine (PEPCID) 10 MG tablet Take 10 mg by mouth daily as needed for heartburn.   fexofenadine (ALLEGRA) 60 MG tablet Take 1 tablet (60 mg total) by mouth daily.   furosemide (LASIX) 20 MG tablet TAKE (1) TABLET BY MOUTH ONCE DAILY IN THE MORNING.   Iron, Ferrous Sulfate, 325 (65 Fe) MG TABS Take 325 mg by mouth 3 (three) times a week. On Monday,Wednesday and Friday   lisinopril (ZESTRIL) 5 MG tablet Take 0.5 tablets (2.5 mg total) by mouth daily.   mirtazapine (REMERON) 7.5 MG tablet Take 1 tablet (7.5 mg total) by mouth at bedtime. (Patient taking differently: Take 15 mg by mouth at bedtime.)   montelukast (SINGULAIR) 10 MG tablet Take 1 tablet (10 mg total) by mouth daily.   Multiple Vitamins-Minerals (EMERGEN-C VITAMIN C PO) Take 1 packet by mouth daily. Mix with water   rosuvastatin (CRESTOR) 20 MG tablet Take 1 tablet (20 mg total) by mouth daily.   Trolamine Salicylate (ASPERCREME EX) Apply 1 application topically daily as needed (knee pain).     Allergies:   Codeine, Cyclobenzaprine, Iodinated contrast media, and Prednisone   Social History   Socioeconomic History   Marital status: Divorced    Spouse name: Not on file   Number of children: Not on file   Years of education: Not on file   Highest education level: Not on file  Occupational History   Not on file  Tobacco Use   Smoking status: Former     Packs/day: 1.00    Years: 20.00    Total pack years: 20.00    Types: Cigarettes   Smokeless tobacco: Never   Tobacco comments:    Quit 30 yrs ago as of 07/2021 per daughter  Vaping Use   Vaping Use: Never used  Substance and Sexual Activity   Alcohol use: Yes    Comment:  occasiona wine   Drug use: Never   Sexual activity: Not Currently    Birth control/protection: Post-menopausal  Other Topics Concern   Not on file  Social History Narrative   Tobacco use, amount per day now: None   Past tobacco use, amount per day: Up to 1 pack   How many years did you use tobacco: 20 years.   Alcohol use (drinks per week): None.   Diet: 3 meals daily/mostly cooked at home.   Do you drink/eat things with caffeine: periodic diet coke.   Marital status:  Divorced.                                What year were you married? 1968   Do you live in a house, apartment, assisted living, condo, trailer, etc.? One Level Townhome   Is it one or more stories? One Level   How many persons live in your home? One   Do you have pets in your home?( please list) No   Highest Level of education completed? High School   Current or past profession: Immunologist.    Do you exercise?   Yes when able.                               Type and how often? Silver Sneakers 2-3 times weekly.   Do you have a living will? Yes   Do you have a DNR form?        No                           If not, do you want to discuss one? Yes   Do you have signed POA/HPOA forms?   Yes                     If so, please bring to you appointment      Do you have any difficulty bathing or dressing yourself? Yes   Do you have any difficulty preparing food or eating? Yes   Do you have any difficulty managing your medications? Yes   Do you have any difficulty managing your finances? No   Do you have any difficulty affording your medications? No   Social Determinants of Health   Financial Resource Strain: Low Risk  (05/14/2019)    Overall Financial Resource Strain (CARDIA)    Difficulty of Paying Living Expenses: Not hard at all  Food Insecurity: Unknown (05/14/2019)   Hunger Vital Sign    Worried About Running Out of Food in the Last Year: Patient refused    Ran Out of Food in the Last Year: Patient refused  Transportation Needs: No Transportation Needs (05/14/2019)   PRAPARE - Administrator, Civil Service (Medical): No    Lack of Transportation (Non-Medical): No  Physical Activity: Not on file  Stress: Not on file  Social Connections: Unknown (05/14/2019)   Social Connection and Isolation Panel [NHANES]    Frequency of Communication with Friends and Family: More than three times a week    Frequency of Social Gatherings with Friends and Family: More than three times a week    Attends Religious Services: Never    Database administrator or Organizations: Not on file    Attends Banker Meetings: Not on file  Marital Status: Not on file     Family History: The patient's family history includes Alcohol abuse in her sister; Brain cancer in her brother; Cancer in her brother and sister; Diabetes in her brother; Down syndrome in her brother; Endometriosis in her daughter; Heart Problems in her brother; Heart failure in her brother and brother; Hypertension in an other family member; Lung cancer in her sister; Melanoma in her brother.  ROS:   Please see the history of present illness.    All other systems reviewed and are negative.  EKGs/Labs/Other Studies Reviewed:    The following studies were reviewed today:  TAVR OPERATIVE NOTE     Date of Procedure:                08/03/2021   Preoperative Diagnosis:      Severe Aortic Stenosis    Postoperative Diagnosis:    Same    Procedure:        Transcatheter Aortic Valve Replacement - Transfemoral Approach             Edwards Sapien 3 Resilia THV (size 66mm, model # 9755RLS, serial # # [id number], serial # [id number])               Co-Surgeons:                         Alleen Borne, MD and Alverda Skeans, MD Anesthesiologist:                  Rondall Allegra, MD   Echocardiographer:              Burna Forts, MD   Pre-operative Echo Findings: Severe aortic stenosis Normal left ventricular systolic function   Post-operative Echo Findings: No paravalvular leak Normal left ventricular systolic function _____________   Echo 08/04/21:    1. Left ventricular ejection fraction, by estimation, is 65 to 70%. The  left ventricle has normal function. The left ventricle has no regional  wall motion abnormalities. There is mild left ventricular hypertrophy of  the basal-septal segment. Left  ventricular diastolic parameters are consistent with Grade I diastolic  dysfunction (impaired relaxation). Elevated left atrial pressure.   2. Right ventricular systolic function is normal. The right ventricular  size is normal. Tricuspid regurgitation signal is inadequate for assessing  PA pressure.   3. Left atrial size was mildly dilated.   4. The mitral valve is degenerative. Mild mitral valve regurgitation.  Mild mitral stenosis. The mean mitral valve gradient is 7.0 mmHg with  average heart rate of 93 bpm. Severe mitral annular calcification.   5. There is trivial perivalvular leak in the vicinity of the right  coronary ostium. The aortic valve has been repaired/replaced. Aortic valve  regurgitation is mild. There is a 23 mm Sapien prosthetic (TAVR) valve  present in the aortic position.  Procedure Date: 08/03/21. Aortic valve mean gradient measures 10.2 mmHg.  Aortic valve Vmax measures 2.22 m/s. Aortic valve acceleration time  measures 58 msec.   6. The inferior vena cava is dilated in size with <50% respiratory  variability, suggesting right atrial pressure of 15 mmHg.   ______________________  Echo 09/10/21 IMPRESSIONS  1. 23 mm S3 is present in the aortic position. There is abnormal color  flow in the region of the  prosthesis, but this is forward flow based on  color doppler. There is no apparenent regurgitation or paravalvular leak.  Possibly, this represents flow  between  the stent frame and aortic root. There is also a small color  signal below the annulus in the 8-10 o'clock position. Cannot exclude a  very small iatrogenic VSD. Vmax 2.1 m/s, MG 9.5 mmHG, EOA 2.42 cm2.  Hemodynamics are within limits. Would  consider TEE for characterization if clinically indicated. The aortic  valve has been repaired/replaced. Aortic valve regurgitation is not  visualized. There is a 23 mm Sapien prosthetic (TAVR) valve present in the  aortic position. Procedure Date:  08/03/2021.   2. Left ventricular ejection fraction, by estimation, is >75%. The left  ventricle has hyperdynamic function. The left ventricle has no regional  wall motion abnormalities. There is moderate concentric left ventricular  hypertrophy. Left ventricular  diastolic function could not be evaluated.   3. Right ventricular systolic function is normal. The right ventricular  size is normal. There is normal pulmonary artery systolic pressure. The  estimated right ventricular systolic pressure is 26.0 mmHg.   4. Left atrial size was mildly dilated.   5. The mitral valve is degenerative. No evidence of mitral valve  regurgitation. Moderate mitral annular calcification.   6. The inferior vena cava is normal in size with greater than 50%  respiratory variability, suggesting right atrial pressure of 3 mmHg.   Comparison(s): No significant change from prior study. AoV gradients  stable. Color jet described above that is abnormal.   _____________________      Echo 03/26/22 IMPRESSIONS   1. Left ventricular ejection fraction, by estimation, is 65 to 70%. The  left ventricle has normal function. The left ventricle has no regional  wall motion abnormalities. There is moderate left ventricular hypertrophy.  Left ventricular diastolic  parameters  are indeterminate.   2. Right ventricular systolic function is normal. The right ventricular  size is normal. Tricuspid regurgitation signal is inadequate for assessing  PA pressure.   3. Left atrial size was moderately dilated.   4. The mitral valve is degenerative. Trivial mitral valve regurgitation.  Mild mitral stenosis. The mean mitral valve gradient is 5.0 mmHg with  average heart rate of 68 bpm. Severe mitral annular calcification.   5. The abnormal color flow signal seen previously below the prosthetic  aortic valve is seen again on this exam, color flow in multiple views  suggestive of valvular regurgitation, though it cannot be demonstrated  with spectral Doppler, which would  support color flow artifact, possibly from stent frame of valve. No  evidence of prosthetic valve obstruction or significant regurgitation.      The aortic valve has been repaired/replaced. Aortic valve  regurgitation is mild. There is a 23 mm Sapien prosthetic (TAVR) valve  present in the aortic position. Procedure Date: 08/03/21. Echo findings are  consistent with likely stable structure and  function of the aortic valve prosthesis. Aortic valve area, by VTI  measures 2.58 cm. Aortic valve mean gradient measures 11.0 mmHg. Aortic  valve Vmax measures 1.98 m/s. Aortic valve acceleration time measures 85  msec.   6. The inferior vena cava is normal in size with greater than 50%  respiratory variability, suggesting right atrial pressure of 3 mmHg.   _____________________    Echo 08/10/22 IMPRESSION  1. On image 60, there is high velocity color flow seen extending into the LV septum. Cannot exclude VSD. Left ventricular ejection fraction, by estimation, is 65 to 70%. The left ventricle has normal function. The left ventricle has no regional wall  motion abnormalities. There is severe asymmetric left ventricular hypertrophy of the  septal segment. Left ventricular diastolic parameters are consistent with  Grade I diastolic dysfunction (impaired relaxation).  2. Right ventricular systolic function is normal. The right ventricular size is normal. There is normal pulmonary artery systolic pressure.  3. Left atrial size was moderately dilated.  4. The mitral valve is degenerative. Trivial mitral valve regurgitation. Mild mitral stenosis. Severe mitral annular calcification.  5. Tricuspid valve regurgitation is mild to moderate.  6. The aortic valve has been repaired/replaced. Aortic valve regurgitation is trivial. 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 Procedure Date: 08/03/2021. Aortic regurgitation PHT measures 199 msec. Aortic valve  area, by VTI measures 3.18 cm. Aortic valve mean gradient measures 14.3 mmHg. Aortic valve Vmax measures 2.43 m/s.  7. The inferior vena cava is normal in size with greater than 50% respiratory variability, suggesting right atrial pressure of 3 mmHg.   Comparison(s): Prior images reviewed side by side.   Conclusion(s)/Recommendation(s): S/P TAVR. There is trivial perivalvular leak seen with color flow and CW Doppler. There is again high velocity color flow seen in the ventricular septum, cannot exclude small VSD.  Recent Labs: 05/10/2022: TSH 0.623 05/13/2022: Magnesium 2.1 08/08/2022: ALT 19; BUN 16; Creat 1.24; Hemoglobin 8.9; Platelets 273; Potassium 4.5; Sodium 138  Recent Lipid Panel    Component Value Date/Time   CHOL 204 (H) 03/26/2022 0137   TRIG 77 03/26/2022 0137   HDL 86 03/26/2022 0137   CHOLHDL 2.4 03/26/2022 0137   VLDL 15 03/26/2022 0137   LDLCALC 103 (H) 03/26/2022 0137   LDLCALC 95 02/15/2022 1119     Physical Exam:    VS:  BP (!) 142/58   Pulse 94   Ht 5\' 1"  (1.549 m)   Wt 112 lb 3.2 oz (50.9 kg)   SpO2 95%   BMI 21.20 kg/m     Wt Readings from Last 3 Encounters:  08/10/22 112 lb 3.2 oz (50.9 kg)  08/08/22 114 lb 3.2 oz (51.8 kg)  06/28/22 114 lb 12.8 oz (52.1 kg)    General: Elderly,  NAD Neck: Negative for carotid bruits. No JVD Lungs:Clear to ausculation bilaterally. Breathing is unlabored. Cardiovascular: RRR with S1 S2. Systolic murmur present with no rubs, gallops, or LV heave appreciated. Extremities: No edema.  Neuro: Alert and oriented. No focal deficits. No facial asymmetry. MAE spontaneously. Psych: Responds to questions appropriately with normal affect.    ASSESSMENT/PLAN:    Severe AS s/p TAVR: echo today shows EF >75%, normally functioning TAVR with a mean gradient of 14 mm hg and trivial PVL as well as high velocity color flow seen extending into the LV septum. Cannot exclude VSD. This has previously been reviewed multidisciplinary valve team meeting and felt to be a small VSD vs PVL. She does not have a loud murmur of either. Plan to continue conservative measures with no need to perform further work up. She has NYHA class II symptoms; mostly of fatigue. She has Amoxicillin for SBE prophylaxis. Continue monotherapy with Pradaxa. Continue regular follow up with Dr. 14/05/23   Hx of pericardial effusion: no effusion on echo today.    Paroxysmal atrial fibrillation: continue pradaxa.    Carotid artery disease: she is followed by [facility]. Carotid dopplers have demonstrated moderate stenosis, however CT angiogram demonstrated 80 to 90% stenosis. Dr. Swaziland offered her transcarotid artery revascularization vs medical therapy and she preferred medical therapy.  Continue ASA, statin.    Chronic diastolic CHF: appears euvolemic on exam. Continue Lasix 20mg  daily  HTN: BP mildly elevated, but has a history of falls with orthostasis so will not up-titrate meds.   Recent left MCA stroke: continue to struggles with residual deficits. Living at a SNF and in intensive rehab. Was on Eliquis - now switched to Pradaxa. No evidence of aFib during hospitalization. Prior Afib burden low. Subsequent monitor showed minimal atrial arrhythmias. Continue PT,OT, and speech  therapy   Medication Adjustments/Labs and Tests Ordered: Current medicines are reviewed at length with the patient today.  Concerns regarding medicines are outlined above.  No orders of the defined types were placed in this encounter.  No orders of the defined types were placed in this encounter.   Patient Instructions  Medication Instructions:  Your physician recommends that you continue on your current medications as directed. Please refer to the Current Medication list given to you today.  *If you need a refill on your cardiac medications before your next appointment, please call your pharmacy*    Follow-Up: At [facility], you and your health needs are our priority.  As part of our continuing mission to provide you with exceptional heart care, we have created designated Provider Care Teams.  These Care Teams include your primary Cardiologist (physician) and Advanced Practice Providers (APPs -  Physician Assistants and Nurse Practitioners) who all work together to provide you with the care you need, when you need it.   Your next appointment:   As scheduled with Dr Neita Garnet        Signed, Angelena Form, PA-C  08/11/2022 11:17 AM    [place]

## 2022-08-10 ENCOUNTER — Ambulatory Visit: Payer: Medicare Other | Attending: Physician Assistant | Admitting: Physician Assistant

## 2022-08-10 ENCOUNTER — Ambulatory Visit (HOSPITAL_BASED_OUTPATIENT_CLINIC_OR_DEPARTMENT_OTHER): Payer: Medicare Other

## 2022-08-10 VITALS — BP 142/58 | HR 94 | Ht 61.0 in | Wt 112.2 lb

## 2022-08-10 DIAGNOSIS — I3139 Other pericardial effusion (noninflammatory): Secondary | ICD-10-CM | POA: Insufficient documentation

## 2022-08-10 DIAGNOSIS — I5032 Chronic diastolic (congestive) heart failure: Secondary | ICD-10-CM | POA: Diagnosis present

## 2022-08-10 DIAGNOSIS — Z952 Presence of prosthetic heart valve: Secondary | ICD-10-CM | POA: Insufficient documentation

## 2022-08-10 DIAGNOSIS — I10 Essential (primary) hypertension: Secondary | ICD-10-CM | POA: Diagnosis present

## 2022-08-10 DIAGNOSIS — I63412 Cerebral infarction due to embolism of left middle cerebral artery: Secondary | ICD-10-CM | POA: Diagnosis present

## 2022-08-10 DIAGNOSIS — I480 Paroxysmal atrial fibrillation: Secondary | ICD-10-CM

## 2022-08-10 DIAGNOSIS — I6523 Occlusion and stenosis of bilateral carotid arteries: Secondary | ICD-10-CM | POA: Insufficient documentation

## 2022-08-10 LAB — ECHOCARDIOGRAM COMPLETE
AR max vel: 2.99 cm2
AV Area VTI: 3.18 cm2
AV Area mean vel: 2.74 cm2
AV Mean grad: 14.3 mmHg
AV Peak grad: 23.6 mmHg
Ao pk vel: 2.43 m/s
Area-P 1/2: 1.76 cm2
MV M vel: 4.9 m/s
MV Peak grad: 96 mmHg
P 1/2 time: 199 msec
S' Lateral: 1.7 cm

## 2022-08-10 NOTE — Progress Notes (Signed)
-    hgb 8.9, down from 9.6 taken 05/19/22, continue FeSO4 -  creatinine 1.24, elevated (up from 0.95 taken 05/19/22), will need to increase fluid intake

## 2022-08-10 NOTE — Patient Instructions (Signed)
Medication Instructions:  Your physician recommends that you continue on your current medications as directed. Please refer to the Current Medication list given to you today.  *If you need a refill on your cardiac medications before your next appointment, please call your pharmacy*    Follow-Up: At [facility], you and your health needs are our priority.  As part of our continuing mission to provide you with exceptional heart care, we have created designated Provider Care Teams.  These Care Teams include your primary Cardiologist (physician) and Advanced Practice Providers (APPs -  Physician Assistants and Nurse Practitioners) who all work together to provide you with the care you need, when you need it.   Your next appointment:   As scheduled with Dr Neita Garnet

## 2022-08-19 ENCOUNTER — Telehealth: Payer: Self-pay

## 2022-08-19 DIAGNOSIS — F331 Major depressive disorder, recurrent, moderate: Secondary | ICD-10-CM | POA: Diagnosis not present

## 2022-08-19 NOTE — Telephone Encounter (Signed)
[place] with [facility] called requesting verbal orders for speech therapy for cognition and communication. They are requesting new orders due to change in patient's insurance.  Verbal orders were given

## 2022-08-23 NOTE — Progress Notes (Signed)
Cardiology Office Note:    Date:  08/23/2022   ID:  Barbara Mcconnell, DOB Jun 22, 1935, MRN [id number]  PCP:  Frederica Kuster, MD  Cardiologist:  Hanin Decook Swazil[place], MD  Electrophysiologist:  None   Referring MD: Frederica Kuster, MD   No chief complaint on file.   History of Present Illness:    Barbara Mcconnell is a 88 y.o. female is seen for follow up aortic stenosis s/p TAVR and Takotsubo cardiomyopathy.  She had a cardiac event approximately 7 years ago in [place], we have no records of that. According to the patient this was Takotsubos cardiomyopathy.   Other medical issues include hypertension, peripheral vascular disease with moderate carotid stenosis, and dyslipidemia with a history of statin intolerance.    The patient presented to the med center in [facility] 05/13/2019 with substernal chest pain and diaphoresis.  She ruled in for an MI.  Admitted to [facility] hospital. Catheterization was done which showed a 50% RCA otherwise normal coronaries and an ejection fraction of 30 to 35% with moderate AS. LV pattern was c/w Takotsubo's disease.  In the hospital she was somewhat hypotensive but this improved prior to discharge and she was put back on her lisinopril but at a lower dose. She is also on low dose Toprol.  On repeat Echo in March 2021 her EF had normalized but there was moderate to severe Aortic stenosis and repeat in 6 months was recommended. Unfortunately she did not have follow up until she presented to [facility] 07/21/21-07/30/21 for acute dyspnea. Echo showed normal LVEF with a moderate circumfrential pericardial effusion measuring 1.5 cm. There was moderate AS with a mean gradient at (was 20.68mmhg), AVA 1.38cm and DI 0.29. She underwent [facility] 07/23/21 which showed stable, mild to moderate CAD with hemodynamics consistent with pulmonary hypertension. Due to moderate AS, she was initially felt to be a good PROGRESS trial candidate however with discrepancies between echocardiogram findings and  inability to cross AV, pre-TAVR CT imaging was obtained. These showed an AVA 1.01cm2, annular area measuring 448mm2, and large circumferential pericardial effusion. After further discussion, she opted to defer PROGRESS trail consideration and move forward with TAVR given progressive symptoms, LVH with small LV cavity and calcium score at 2093. Her hospital stay was further complicated by new onset atrial fibrillation with RVR. She was placed on diltiazem infusion with relatively quick conversion to NSR. She ultimately underwent pericardiocentesis 07/28/21 and drain placement. This was discontinued 07/29/21 and the patient was discharged from the hospital 07/30/21. During her admission, she was evaluated by our multidisciplinary valve team and plans were made for TAVR on 08/03/21.    She underwent successful TAVR with a 23 mm Edwards Sapien 3 Resilia via the TF approach on 08/03/21. Post operative echo showed LVEF at 65-70% with G1DD, mild MR, mild MS, trivial PVL in the right coronary ostium with mild MR with a mean gradient at 10.53mmHg. ECG with NSR and frequent PVCs however no high grade heart block. She was initially started on ASA 81mg  QD however this was changed to Eliquis 2.5mg  BID after discharge due to AF on ZIO monitor.  She was admitted 9/1-03/30/22 with acute left MCA stroke. She had difficulty with word finding and confusion. She was on Eliquis. Had IR intervention of M2 segment with apparent reocclusion of M3 segment. Echo was stable. CT showed severe stenosis at the origin of the RICA. No significant stenosis on the left. (Prior dopplers in Feb showed 50-69% RICA stenosis). She was switched  from Eliquis to Pradaxa. DC to SNF.  While in hospital no Afib seen. She was initially at [facility] but has since been moved to [facility] assisted living. She did wear an Event monitor which showed some nonsustained atrial arrhythmias. She is seen with her daughter today.   She did fall in October. Was found to be  orthostatic so metoprolol was held. DC on very low dose lisinopril 1.25 mg daily. This was later increased to 2.5 mg daily  She is frustrated about having to stay at [facility] - wants to go home. Some fatigue after walking but this has improved. Still has some expressive deficit but this has improved. Mild congestion/cough which she relates to allergies. No recent falls.    Past Medical History:  Diagnosis Date   Allergies    Anxiety    panic attacks   Aortic stenosis    Arthritis    Bronchiectasis with (acute) exacerbation (HCC)    CAD (coronary artery disease)    non obst CAD   Depression due to acute stroke (HCC)    Dizziness    Elevated liver enzymes    Fall    Fluid overload    GERD (gastroesophageal reflux disease)    Heart murmur    Hemorrhoids    History of mammogram    Hyperlipemia    Hypertension    Internal carotid artery stenosis    OCD (obsessive compulsive disorder)    Orthostatic hypotension    Other cirrhosis of liver (HCC)    PAF (paroxysmal atrial fibrillation) (HCC)    Pericardial effusion    Physical deconditioning    Renal insufficiency    Rheumatic heart failure (congestive) (HCC)    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 Ali Lowe & Dr. Cyndia Bent   Seasonal allergies    Severe aortic stenosis    Stroke [facility])    Takotsubo cardiomyopathy     Past Surgical History:  Procedure Laterality Date   COLONOSCOPY     EYE SURGERY Bilateral    cataracts removed   INTRAOPERATIVE TRANSTHORACIC ECHOCARDIOGRAM N/A 08/03/2021   Procedure: INTRAOPERATIVE TRANSTHORACIC ECHOCARDIOGRAM;  Surgeon: Early Osmond, MD;  Location: [place] [facility];  Service: Open Heart Surgery;  Laterality: N/A;   IR CT HEAD LTD  03/25/2022   IR PERCUTANEOUS ART THROMBECTOMY/INFUSION INTRACRANIAL INC DIAG ANGIO  03/25/2022   IR US GUIDE VASC ACCESS RIGHT  03/25/2022   PERICARDIOCENTESIS N/A 07/28/2021    Procedure: PERICARDIOCENTESIS;  Surgeon: Early Osmond, MD;  Location: [place] [facility];  Service: Cardiovascular;  Laterality: N/A;   RADIOLOGY WITH ANESTHESIA N/A 03/25/2022   Procedure: IR WITH ANESTHESIA;  Surgeon: Radiologist, Medication, MD;  Location: [place];  Service: Radiology;  Laterality: N/A;   RIGHT/LEFT HEART CATH AND CORONARY ANGIOGRAPHY N/A 05/13/2019   Procedure: RIGHT/LEFT HEART CATH AND CORONARY ANGIOGRAPHY;  Surgeon: Martinique, Yaritzel Stange M, MD;  Location: [place] [facility];  Service: Cardiovascular;  Laterality: N/A;   RIGHT/LEFT HEART CATH AND CORONARY ANGIOGRAPHY N/A 07/23/2021   Procedure: RIGHT/LEFT HEART CATH AND CORONARY ANGIOGRAPHY;  Surgeon: Jettie Booze, MD;  Location: [place] [facility];  Service: Cardiovascular;  Laterality: N/A;   TONSILLECTOMY     TRANSCATHETER AORTIC VALVE REPLACEMENT, TRANSFEMORAL N/A 08/03/2021   Procedure: TRANSCATHETER AORTIC VALVE REPLACEMENT, TRANSFEMORAL;  Surgeon: Early Osmond, MD;  Location: [place] [facility];  Service: Open Heart Surgery;  Laterality:  N/A;   UPPER GI ENDOSCOPY      Current Medications: No outpatient medications have been marked as taking for the 08/29/22 encounter (Appointment) with [place], Peggi Yono M, MD.     Allergies:   Codeine, Cyclobenzaprine, Iodinated contrast media, and Prednisone   Social History   Socioeconomic History   Marital status: Divorced    Spouse name: Not on file   Number of children: Not on file   Years of education: Not on file   Highest education level: Not on file  Occupational History   Not on file  Tobacco Use   Smoking status: Former    Packs/day: 1.00    Years: 20.00    Total pack years: 20.00    Types: Cigarettes   Smokeless tobacco: Never   Tobacco comments:    Quit 30 yrs ago as of 07/2021 per daughter  Vaping Use   Vaping Use: Never used  Substance and Sexual Activity   Alcohol use: Yes    Comment: occasiona wine   Drug use: Never   Sexual activity: Not  Currently    Birth control/protection: Post-menopausal  Other Topics Concern   Not on file  Social History Narrative   Tobacco use, amount per day now: None   Past tobacco use, amount per day: Up to 1 pack   How many years did you use tobacco: 20 years.   Alcohol use (drinks per week): None.   Diet: 3 meals daily/mostly cooked at home.   Do you drink/eat things with caffeine: periodic diet coke.   Marital status:  Divorced.                                What year were you married? 1968   Do you live in a house, apartment, assisted living, condo, trailer, etc.? One Level Townhome   Is it one or more stories? One Level   How many persons live in your home? One   Do you have pets in your home?( please list) No   Highest Level of education completed? High School   Current or past profession: Immunologist.    Do you exercise?   Yes when able.                               Type and how often? Silver Sneakers 2-3 times weekly.   Do you have a living will? Yes   Do you have a DNR form?        No                           If not, do you want to discuss one? Yes   Do you have signed POA/HPOA forms?   Yes                     If so, please bring to you appointment      Do you have any difficulty bathing or dressing yourself? Yes   Do you have any difficulty preparing food or eating? Yes   Do you have any difficulty managing your medications? Yes   Do you have any difficulty managing your finances? No   Do you have any difficulty affording your medications? No   Social Determinants of Health   Financial Resource Strain: Low Risk  (05/14/2019)   Overall  Financial Resource Strain (CARDIA)    Difficulty of Paying Living Expenses: Not hard at all  Food Insecurity: Unknown (05/14/2019)   Hunger Vital Sign    Worried About Running Out of Food in the Last Year: Patient refused    Ran Out of Food in the Last Year: Patient refused  Transportation Needs: No Transportation Needs  (05/14/2019)   PRAPARE - Administrator, Civil Service (Medical): No    Lack of Transportation (Non-Medical): No  Physical Activity: Not on file  Stress: Not on file  Social Connections: Unknown (05/14/2019)   Social Connection and Isolation Panel [NHANES]    Frequency of Communication with Friends and Family: More than three times a week    Frequency of Social Gatherings with Friends and Family: More than three times a week    Attends Religious Services: Never    Diplomatic Services operational officer: Not on file    Attends Banker Meetings: Not on file    Marital Status: Not on file     Family History: The patient's family history includes Alcohol abuse in her sister; Brain cancer in her brother; Cancer in her brother and sister; Diabetes in her brother; Down syndrome in her brother; Endometriosis in her daughter; Heart Problems in her brother; Heart failure in her brother and brother; Hypertension in an other family member; Lung cancer in her sister; Melanoma in her brother.  ROS:   Please see the history of present illness.     All other systems reviewed and are negative.  EKGs/Labs/Other Studies Reviewed:     EKG:  EKG is not ordered today.    Recent Labs: 05/10/2022: TSH 0.623 05/13/2022: Magnesium 2.1 08/08/2022: ALT 19; BUN 16; Creat 1.24; Hemoglobin 8.9; Platelets 273; Potassium 4.5; Sodium 138  Recent Lipid Panel    Component Value Date/Time   CHOL 204 (H) 03/26/2022 0137   TRIG 77 03/26/2022 0137   HDL 86 03/26/2022 0137   CHOLHDL 2.4 03/26/2022 0137   VLDL 15 03/26/2022 0137   LDLCALC 103 (H) 03/26/2022 0137   LDLCALC 95 02/15/2022 1119      Physical Exam:    VS:  There were no vitals taken for this visit.    Wt Readings from Last 3 Encounters:  08/10/22 112 lb 3.2 oz (50.9 kg)  08/08/22 114 lb 3.2 oz (51.8 kg)  06/28/22 114 lb 12.8 oz (52.1 kg)     GEN: Thin caucasian female, well developed in no acute distress HEENT:  Normal NECK: No JVD LYMPHATICS: No lymphadenopathy CARDIAC: RRR, gr 2/6 diastolic murmur LSB, no rubs, gallops RESPIRATORY:  Clear to auscultation without rales, wheezing or rhonchi  ABDOMEN: Soft, non-tender, non-distended MUSCULOSKELETAL:  No edema; No deformity  SKIN: Warm and dry NEUROLOGIC:  Alert and oriented x 3. Some word finding difficulty.  PSYCHIATRIC:  Normal affect   Cardiac studies:  Echo 08/04/21:    1. Left ventricular ejection fraction, by estimation, is 65 to 70%. The  left ventricle has normal function. The left ventricle has no regional  wall motion abnormalities. There is mild left ventricular hypertrophy of  the basal-septal segment. Left  ventricular diastolic parameters are consistent with Grade I diastolic  dysfunction (impaired relaxation). Elevated left atrial pressure.   2. Right ventricular systolic function is normal. The right ventricular  size is normal. Tricuspid regurgitation signal is inadequate for assessing  PA pressure.   3. Left atrial size was mildly dilated.   4. The mitral valve is  degenerative. Mild mitral valve regurgitation.  Mild mitral stenosis. The mean mitral valve gradient is 7.0 mmHg with  average heart rate of 93 bpm. Severe mitral annular calcification.   5. There is trivial perivalvular leak in the vicinity of the right  coronary ostium. The aortic valve has been repaired/replaced. Aortic valve  regurgitation is mild. There is a 23 mm Sapien prosthetic (TAVR) valve  present in the aortic position.  Procedure Date: 08/03/21. Aortic valve mean gradient measures 10.2 mmHg.  Aortic valve Vmax measures 2.22 m/s. Aortic valve acceleration time  measures 58 msec.   6. The inferior vena cava is dilated in size with <50% respiratory  variability, suggesting right atrial pressure of 15 mmHg.    ______________________   Echo 09/10/21 IMPRESSIONS  1. 23 mm S3 is present in the aortic position. There is abnormal color  flow in the  region of the prosthesis, but this is forward flow based on  color doppler. There is no apparenent regurgitation or paravalvular leak.  Possibly, this represents flow  between the stent frame and aortic root. There is also a small color  signal below the annulus in the 8-10 o'clock position. Cannot exclude a  very small iatrogenic VSD. Vmax 2.1 m/s, MG 9.5 mmHG, EOA 2.42 cm2.  Hemodynamics are within limits. Would  consider TEE for characterization if clinically indicated. The aortic  valve has been repaired/replaced. Aortic valve regurgitation is not  visualized. There is a 23 mm Sapien prosthetic (TAVR) valve present in the  aortic position. Procedure Date:  08/03/2021.   2. Left ventricular ejection fraction, by estimation, is >75%. The left  ventricle has hyperdynamic function. The left ventricle has no regional  wall motion abnormalities. There is moderate concentric left ventricular  hypertrophy. Left ventricular  diastolic function could not be evaluated.   3. Right ventricular systolic function is normal. The right ventricular  size is normal. There is normal pulmonary artery systolic pressure. The  estimated right ventricular systolic pressure is 12.4 mmHg.   4. Left atrial size was mildly dilated.   5. The mitral valve is degenerative. No evidence of mitral valve  regurgitation. Moderate mitral annular calcification.   6. The inferior vena cava is normal in size with greater than 50%  respiratory variability, suggesting right atrial pressure of 3 mmHg.   Comparison(s): No significant change from prior study. AoV gradients  stable. Color jet described above that is abnormal.     Jettie Booze, MD (Primary)     Procedures  RIGHT/LEFT HEART CATH AND CORONARY ANGIOGRAPHY   Conclusion      Prox RCA lesion is 50% stenosed.   Prox Cx to Mid Cx lesion is 25% stenosed.   Prox LAD to Mid LAD lesion is 25% stenosed.   Hemodynamic findings consistent with pulmonary  hypertension.   Stable, mild to moderate coronary artery disease.  No indication for PCI.   Unable to cross aortic valve, consistent with worsening noted on echocardiogram.  Continue plans for TAVR w/u.   Echo 03/26/22: IMPRESSIONS     1. Left ventricular ejection fraction, by estimation, is 65 to 70%. The  left ventricle has normal function. The left ventricle has no regional  wall motion abnormalities. There is moderate left ventricular hypertrophy.  Left ventricular diastolic  parameters are indeterminate.   2. Right ventricular systolic function is normal. The right ventricular  size is normal. Tricuspid regurgitation signal is inadequate for assessing  PA pressure.   3. Left atrial size was  moderately dilated.   4. The mitral valve is degenerative. Trivial mitral valve regurgitation.  Mild mitral stenosis. The mean mitral valve gradient is 5.0 mmHg with  average heart rate of 68 bpm. Severe mitral annular calcification.   5. The abnormal color flow signal seen previously below the prosthetic  aortic valve is seen again on this exam, color flow in multiple views  suggestive of valvular regurgitation, though it cannot be demonstrated  with spectral Doppler, which would  support color flow artifact, possibly from stent frame of valve. No  evidence of prosthetic valve obstruction or significant regurgitation.      The aortic valve has been repaired/replaced. Aortic valve  regurgitation is mild. There is a 23 mm Sapien prosthetic (TAVR) valve  present in the aortic position. Procedure Date: 08/03/21. Echo findings are  consistent with likely stable structure and  function of the aortic valve prosthesis. Aortic valve area, by VTI  measures 2.58 cm. Aortic valve mean gradient measures 11.0 mmHg. Aortic  valve Vmax measures 1.98 m/s. Aortic valve acceleration time measures 85  msec.   6. The inferior vena cava is normal in size with greater than 50%  respiratory variability, suggesting  right atrial pressure of 3 mmHg.    Event monitor 06/07/22: Study Highlights      Nromal sinus rhythm   Rare PVCs with bigeminy   Occasional PACs with short runs of SVT   On October 27 there were 2 episoded of SVT c/w atrial flutter at 2:48 and 5:42 pm. longest lasting 8 minutes and 24 seconds.     Patch Wear Time:  13 days and 21 hours (2023-10-20T13:40:10-398 to 2023-11-03T11:06:28-0400)   Patient had a min HR of 61 bpm, max HR of 211 bpm, and avg HR of 90 bpm. Predominant underlying rhythm was Sinus Rhythm. QRS morphology changes were present throughout recording. 53 Supraventricular Tachycardia runs occurred, the run with the fastest  interval lasting 6 beats with a max rate of 211 bpm, the longest lasting 8 mins 24 secs with an avg rate of 158 bpm. Some episodes of Supraventricular Tachycardia may be possible Atrial Tachycardia with variable block. Isolated SVEs were occasional  (1.6%, 28180), SVE Couplets were rare (<1.0%, 955), and SVE Triplets were rare (<1.0%, 127). Isolated VEs were rare (<1.0%, 12422), VE Couplets were rare (<1.0%, 213), and VE Triplets were rare (<1.0%, 63). Ventricular Bigeminy and Trigeminy were   Echo 08/10/22: IMPRESSIONS     1. On image 60, there is high velocity color flow seen extending into the  LV septum. Cannot exclude VSD. Left ventricular ejection fraction, by  estimation, is 65 to 70%. The left ventricle has normal function. The left  ventricle has no regional wall  motion abnormalities. There is severe asymmetric left ventricular  hypertrophy of the septal segment. Left ventricular diastolic parameters  are consistent with Grade I diastolic dysfunction (impaired relaxation).   2. Right ventricular systolic function is normal. The right ventricular  size is normal. There is normal pulmonary artery systolic pressure.   3. Left atrial size was moderately dilated.   4. The mitral valve is degenerative. Trivial mitral valve regurgitation.  Mild  mitral stenosis. Severe mitral annular calcification.   5. Tricuspid valve regurgitation is mild to moderate.   6. The aortic valve has been repaired/replaced. Aortic valve  regurgitation is trivial. There is a 23 mm Sapien prosthetic (TAVR) valve  present in the aortic position. Procedure Date: 08/03/2021. Aortic  regurgitation PHT measures 199 msec. Aortic valve  area, by VTI measures 3.18 cm. Aortic valve mean gradient measures 14.3  mmHg. Aortic valve Vmax measures 2.43 m/s.   7. The inferior vena cava is normal in size with greater than 50%  respiratory variability, suggesting right atrial pressure of 3 mmHg.   Comparison(s): Prior images reviewed side by side.   ASSESSMENT:     Severe AS s/p TAVR: echo in Jan showed normal LV function. Prosthesis is functioning well.  mild AI.  No change. She has Amoxicillin for SBE prophylaxis. Continue anticoagulation.      2.  Left MCA stroke. Was on Eliquis - now switched to Pradaxa.  Prior Afib burden low. No rate control necessary  3.  Hx of pericardial effusion: s/p pericardial drainage. no recurrent effusion on echo   4. Paroxysmal fibrillation: continue Pradaxa given CVA.    5. Carotid artery disease: pre TAVR head/neck CTA with moderate right carotid artery stenosis with plans for surveillance dopplers in the OP setting. She is followed by [facility]- Dr Virl Cagey. Dopplers in Feb showed moderate RICA disease. More recent CT angio suggests severe stenosis at origin of RICA similar to prior CT. Follow up with [facility]   5. s/p Non-ST elevation (NSTEMI) myocardial infarction ([place]) due to demand ischemia in setting of stress induced CM in 2020 05/13/2019- HS Troponin peak 879. With decreased EF and no significant CAD strongly suspect this is related to recurrent Takotsubo cardiomyopathy  6. Takotsubo cardiomyopathy H/O remote Takotsubo event 5 years ago-Recurrent in October 2020 EF 30-35% by echo 05/13/2019 when she presented with second event - now  with normal LV function.  7. Essential hypertension BP is OK on very low dose lisinopril and lasix. Continue same  8. Dyslipidemia, goal LDL below 70 On Crestor  9. Anemia.   Follow up with me in 6 months.  Medication Adjustments/Labs and Tests Ordered: Current medicines are reviewed at length with the patient today.  Concerns regarding medicines are outlined above.  No orders of the defined types were placed in this encounter.  No orders of the defined types were placed in this encounter.   There are no Patient Instructions on file for this visit.   Signed, Kenora Spayd Martinique, MD  08/23/2022 4:19 PM    [facility]

## 2022-08-29 ENCOUNTER — Ambulatory Visit: Payer: Medicare Other | Attending: Cardiology | Admitting: Cardiology

## 2022-08-29 ENCOUNTER — Encounter: Payer: Self-pay | Admitting: Cardiology

## 2022-08-29 VITALS — BP 140/70 | HR 96 | Ht 61.0 in | Wt 111.0 lb

## 2022-08-29 DIAGNOSIS — I350 Nonrheumatic aortic (valve) stenosis: Secondary | ICD-10-CM | POA: Insufficient documentation

## 2022-08-29 DIAGNOSIS — I63412 Cerebral infarction due to embolism of left middle cerebral artery: Secondary | ICD-10-CM | POA: Diagnosis present

## 2022-08-29 DIAGNOSIS — Z952 Presence of prosthetic heart valve: Secondary | ICD-10-CM | POA: Diagnosis present

## 2022-08-29 DIAGNOSIS — I480 Paroxysmal atrial fibrillation: Secondary | ICD-10-CM | POA: Insufficient documentation

## 2022-08-29 DIAGNOSIS — I5181 Takotsubo syndrome: Secondary | ICD-10-CM | POA: Diagnosis present

## 2022-08-29 DIAGNOSIS — I10 Essential (primary) hypertension: Secondary | ICD-10-CM | POA: Diagnosis present

## 2022-08-29 DIAGNOSIS — I6523 Occlusion and stenosis of bilateral carotid arteries: Secondary | ICD-10-CM

## 2022-08-29 NOTE — Patient Instructions (Signed)
Medication Instructions:   Your physician recommends that you continue on your current medications as directed. Please refer to the Current Medication list given to you today.  *If you need a refill on your cardiac medications before your next appointment, please call your pharmacy*   Lab Work: NONE ordered at this time of appointment   If you have labs (blood work) drawn today and your tests are completely normal, you will receive your results only by: MyChart Message (if you have MyChart) OR A paper copy in the mail If you have any lab test that is abnormal or we need to change your treatment, we will call you to review the results.   Testing/Procedures: NONE ordered at this time of appointment   Follow-Up: At [facility], you and your health needs are our priority.  As part of our continuing mission to provide you with exceptional heart care, we have created designated Provider Care Teams.  These Care Teams include your primary Cardiologist (physician) and Advanced Practice Providers (APPs -  Physician Assistants and Nurse Practitioners) who all work together to provide you with the care you need, when you need it.   Your next appointment:   6 month(s)  Provider:   Peter Jordan, MD     Other Instructions   

## 2022-09-08 ENCOUNTER — Encounter: Payer: Self-pay | Admitting: Adult Health

## 2022-09-08 ENCOUNTER — Ambulatory Visit (INDEPENDENT_AMBULATORY_CARE_PROVIDER_SITE_OTHER): Payer: Medicare Other | Admitting: Adult Health

## 2022-09-08 VITALS — BP 118/70 | HR 92 | Ht 61.0 in | Wt 111.6 lb

## 2022-09-08 DIAGNOSIS — Z8673 Personal history of transient ischemic attack (TIA), and cerebral infarction without residual deficits: Secondary | ICD-10-CM

## 2022-09-08 DIAGNOSIS — I6523 Occlusion and stenosis of bilateral carotid arteries: Secondary | ICD-10-CM | POA: Diagnosis not present

## 2022-09-08 DIAGNOSIS — F339 Major depressive disorder, recurrent, unspecified: Secondary | ICD-10-CM

## 2022-09-08 DIAGNOSIS — J0110 Acute frontal sinusitis, unspecified: Secondary | ICD-10-CM

## 2022-09-08 MED ORDER — SACCHAROMYCES BOULARDII 250 MG PO CAPS: 250.0000 mg | ORAL_CAPSULE | Freq: Two times a day (BID) | ORAL | 0 refills | Status: AC

## 2022-09-08 MED ORDER — AMOXICILLIN-POT CLAVULANATE 875-125 MG PO TABS: 1.0000 | ORAL_TABLET | Freq: Two times a day (BID) | ORAL | 0 refills | Status: DC

## 2022-09-08 NOTE — Progress Notes (Signed)
[facility] clinic  Provider:  Durenda Age DNP  Code Status:  DNR  Goals of Care:     06/28/2022    1:36 PM  Advanced Directives  Does Patient Have a Medical Advance Directive? Yes  Type of Paramedic of [place];Living will  Does patient want to make changes to medical advance directive? No - Patient declined  Copy of [place] in Chart? Yes - validated most recent copy scanned in chart (See row information)     No chief complaint on file.   HPI: Patient is a 88 y.o. female seen today for an acute visit for possible sinus infection. She has a PMH of hypertension, hyperlipidemia, CAD, Atrial fibrillation on Pradaxa, S/P TAVR, takotsubo cardiomyopathy, CVA, anxiety, arthritis and GERD. She lives in [facility]. She was accompanied today by her daughter. She started having chills, cough,  clear sinus drainage and sneezing 3 days ago. She takes Allegra in the morning and Zyrtec at night for environmental allergies. There is an area in the ALF that is being repaired which she thinks causes her to sneeze and cough. Last night, she was not able to sleep well due to constant nasal drip. Daughter did COVID-19 test this morning and was negative. She complains of frontal and nasal tenderness.   She takes Lexapro and Remeron for depression. Daughter thinks that her mood is better since she is now willing to try things she needs to be doing at the facility. She is ambulating more at the ALF.   Wt Readings from Last 3 Encounters:  09/08/22 111 lb 9.6 oz (50.6 kg)  08/29/22 111 lb (50.3 kg)  08/10/22 112 lb 3.2 oz (50.9 kg)     Past Medical History:  Diagnosis Date   Allergies    Anxiety    panic attacks   Aortic stenosis    Arthritis    Bronchiectasis with (acute) exacerbation (HCC)    CAD (coronary artery disease)    non obst CAD   Depression due to acute stroke (HCC)    Dizziness    Elevated liver enzymes    Fall    Fluid overload     GERD (gastroesophageal reflux disease)    Heart murmur    Hemorrhoids    History of mammogram    Hyperlipemia    Hypertension    Internal carotid artery stenosis    OCD (obsessive compulsive disorder)    Orthostatic hypotension    Other cirrhosis of liver (HCC)    PAF (paroxysmal atrial fibrillation) (HCC)    Pericardial effusion    Physical deconditioning    Renal insufficiency    Rheumatic heart failure (congestive) (HCC)    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 Ali Lowe & Dr. Cyndia Bent   Seasonal allergies    Severe aortic stenosis    Stroke [facility])    Takotsubo cardiomyopathy     Past Surgical History:  Procedure Laterality Date   COLONOSCOPY     EYE SURGERY Bilateral    cataracts removed   INTRAOPERATIVE TRANSTHORACIC ECHOCARDIOGRAM N/A 08/03/2021   Procedure: INTRAOPERATIVE TRANSTHORACIC ECHOCARDIOGRAM;  Surgeon: Early Osmond, MD;  Location: [place] [facility];  Service: Open Heart Surgery;  Laterality: N/A;   IR CT HEAD LTD  03/25/2022   IR PERCUTANEOUS ART THROMBECTOMY/INFUSION INTRACRANIAL INC DIAG ANGIO  03/25/2022   IR US GUIDE VASC ACCESS RIGHT  03/25/2022  PERICARDIOCENTESIS N/A 07/28/2021   Procedure: PERICARDIOCENTESIS;  Surgeon: Early Osmond, MD;  Location: [place] [facility];  Service: Cardiovascular;  Laterality: N/A;   RADIOLOGY WITH ANESTHESIA N/A 03/25/2022   Procedure: IR WITH ANESTHESIA;  Surgeon: Radiologist, Medication, MD;  Location: [place];  Service: Radiology;  Laterality: N/A;   RIGHT/LEFT HEART CATH AND CORONARY ANGIOGRAPHY N/A 05/13/2019   Procedure: RIGHT/LEFT HEART CATH AND CORONARY ANGIOGRAPHY;  Surgeon: Martinique, Peter M, MD;  Location: [place] [facility];  Service: Cardiovascular;  Laterality: N/A;   RIGHT/LEFT HEART CATH AND CORONARY ANGIOGRAPHY N/A 07/23/2021   Procedure: RIGHT/LEFT HEART CATH AND CORONARY ANGIOGRAPHY;  Surgeon: Jettie Booze, MD;   Location: [place] [facility];  Service: Cardiovascular;  Laterality: N/A;   TONSILLECTOMY     TRANSCATHETER AORTIC VALVE REPLACEMENT, TRANSFEMORAL N/A 08/03/2021   Procedure: TRANSCATHETER AORTIC VALVE REPLACEMENT, TRANSFEMORAL;  Surgeon: Early Osmond, MD;  Location: [place] [facility];  Service: Open Heart Surgery;  Laterality: N/A;   UPPER GI ENDOSCOPY      Allergies  Allergen Reactions   Codeine Nausea And Vomiting   Cyclobenzaprine Other (See Comments)    Unknown reaction   Iodinated Contrast Media Nausea Only   Prednisone Other (See Comments)    "Sores in the mouth"    Outpatient Encounter Medications as of 09/08/2022  Medication Sig   acetaminophen (TYLENOL) 500 MG tablet Take 500-1,000 mg by mouth every 6 (six) hours as needed for moderate pain.   albuterol (VENTOLIN HFA) 108 (90 Base) MCG/ACT inhaler Inhale 2 puffs into the lungs every 4 (four) hours as needed for wheezing or shortness of breath (seasonal allergies).   dabigatran (PRADAXA) 150 MG CAPS capsule Take 1 capsule (150 mg total) by mouth every 12 (twelve) hours.   docusate sodium (COLACE) 100 MG capsule Take 100 mg by mouth 2 (two) times daily.   escitalopram (LEXAPRO) 10 MG tablet Take 1 tablet (10 mg total) by mouth daily.   famotidine (PEPCID) 10 MG tablet Take 10 mg by mouth daily as needed for heartburn.   fexofenadine (ALLEGRA) 60 MG tablet Take 1 tablet (60 mg total) by mouth daily.   furosemide (LASIX) 20 MG tablet TAKE (1) TABLET BY MOUTH ONCE DAILY IN THE MORNING.   Iron, Ferrous Sulfate, 325 (65 Fe) MG TABS Take 325 mg by mouth 3 (three) times a week. On Monday,Wednesday and Friday   lisinopril (ZESTRIL) 5 MG tablet Take 0.5 tablets (2.5 mg total) by mouth daily.   mirtazapine (REMERON) 7.5 MG tablet Take 1 tablet (7.5 mg total) by mouth at bedtime. (Patient taking differently: Take 15 mg by mouth at bedtime.)   montelukast (SINGULAIR) 10 MG tablet Take 1 tablet (10 mg total) by mouth daily.   Multiple  Vitamins-Minerals (EMERGEN-C VITAMIN C PO) Take 1 packet by mouth daily. Mix with water   rosuvastatin (CRESTOR) 20 MG tablet Take 1 tablet (20 mg total) by mouth daily.   Trolamine Salicylate (ASPERCREME EX) Apply 1 application topically daily as needed (knee pain).   No facility-administered encounter medications on file as of 09/08/2022.    Review of Systems:  Review of Systems  Constitutional:  Positive for chills. Negative for appetite change, fatigue and fever.  HENT:  Positive for postnasal drip, rhinorrhea, sinus pain and sneezing. Negative for congestion, hearing loss and sore throat.   Eyes: Negative.   Respiratory:  Positive for cough. Negative for shortness of breath and wheezing.        Dry cough  Cardiovascular:  Negative for chest pain, palpitations and leg swelling.  Gastrointestinal:  Negative for abdominal pain, constipation, diarrhea, nausea and vomiting.  Genitourinary:  Negative for dysuria.  Musculoskeletal:  Negative for arthralgias, back pain and myalgias.  Skin:  Negative for color change, rash and wound.  Neurological:  Negative for dizziness, weakness and headaches.  Psychiatric/Behavioral:  Negative for behavioral problems. The patient is not nervous/anxious.     Health Maintenance  Topic Date Due   DEXA SCAN  Never done   COVID-19 Vaccine (6 - 2023-24 season) 03/25/2022   Medicare Annual Wellness (AWV)  06/15/2022   DTaP/Tdap/Td (2 - Td or Tdap) 07/14/2024   Pneumonia Vaccine 38+ Years old  Completed   INFLUENZA VACCINE  Completed   Zoster Vaccines- Shingrix  Completed   HPV VACCINES  Aged Out    Physical Exam: Vitals:   09/08/22 1015  Weight: 111 lb 9.6 oz (50.6 kg)  Height: 5' 1"$  (1.549 m)   Body mass index is 21.09 kg/m. Physical Exam Constitutional:      Appearance: Normal appearance.  HENT:     Head: Normocephalic and atraumatic.     Nose: Nose normal.     Mouth/Throat:     Mouth: Mucous membranes are moist.  Eyes:      Conjunctiva/sclera: Conjunctivae normal.  Cardiovascular:     Rate and Rhythm: Normal rate and regular rhythm.  Pulmonary:     Effort: Pulmonary effort is normal.     Breath sounds: Normal breath sounds.  Abdominal:     General: Bowel sounds are normal.     Palpations: Abdomen is soft.  Musculoskeletal:        General: Normal range of motion.     Cervical back: Normal range of motion.  Skin:    General: Skin is warm and dry.  Neurological:     General: No focal deficit present.     Mental Status: She is alert and oriented to person, place, and time.  Psychiatric:        Mood and Affect: Mood normal.        Behavior: Behavior normal.        Thought Content: Thought content normal.        Judgment: Judgment normal.     Labs reviewed: Basic Metabolic Panel: Recent Labs    02/15/22 1119 03/25/22 1059 05/10/22 1532 05/11/22 0958 05/13/22 0358 05/19/22 1517 08/08/22 1413  NA 136   < > 133*   < > 134* 136 138  K 4.4   < > 4.1   < > 3.5 4.6 4.5  CL 99   < > 98   < > 100 102 104  CO2 31   < > 28   < > 25 25 26  $ GLUCOSE 87   < > 92   < > 103* 87 87  BUN 20   < > 19   < > 15 14 16  $ CREATININE 0.89   < > 0.96   < > 1.02* 0.95 1.24*  CALCIUM 9.5   < > 10.0   < > 9.1 9.3 9.4  MG  --   --   --   --  2.1  --   --   TSH 1.33  --  0.623  --   --   --   --    < > = values in this interval not displayed.   Liver Function Tests: Recent Labs    05/10/22 1532 05/11/22 0958 05/13/22  [id number] 05/19/22 1517 08/08/22 1413  AST 53* 50* 38 48* 45*  ALT 30 26 22 23 19  $ ALKPHOS 53 47 41  --   --   BILITOT 1.5* 1.7* 1.3* 1.2 1.0  PROT 7.7 6.1* 5.6* 6.4 6.3  ALBUMIN 4.3 3.3* 3.1*  --   --    No results for input(s): "LIPASE", "AMYLASE" in the last 8760 hours. Recent Labs    05/10/22 1532 05/19/22 1517  AMMONIA 19 <10   CBC: Recent Labs    05/10/22 1532 05/12/22 0311 05/19/22 1517 08/08/22 1413  WBC 8.0 7.4 6.1 6.3  NEUTROABS 5.7  --  3,129 3,522  HGB 10.8* 8.8* 9.6* 8.9*   HCT 34.2* 27.0* 30.2* 27.8*  MCV 91.7 90.6 92.1 88.5  PLT 280 216 269 273   Lipid Panel: Recent Labs    02/15/22 1119 03/26/22 0137  CHOL 204* 204*  HDL 85 86  LDLCALC 95 103*  TRIG 137 77  CHOLHDL 2.4 2.4   Lab Results  Component Value Date   HGBA1C 5.4 03/26/2022    Procedures since last visit: ECHOCARDIOGRAM COMPLETE  Result Date: 08/10/2022    ECHOCARDIOGRAM REPORT   Patient Name:   Barbara Mcconnell  Date of Exam: 08/10/2022 Medical Rec #:  [id number]     Height:       61.0 in Accession #:    [id number]    Weight:       114.2 lb Date of Birth:  13-Aug-1934      BSA:          1.488 m Patient Age:    11 years      BP:           120/68 mmHg Patient Gender: F             HR:           92 bpm. Exam Location:  [place] Procedure: 2D Echo, Cardiac Doppler and Color Doppler Indications:    Z95.2 S/P TAVR (56m EOlivia Mackie  History:        Patient has prior history of Echocardiogram examinations, most                 recent 03/26/2022. CAD, Signs/Symptoms:Murmur; Risk                 Factors:Hypertension, Dyslipidemia and Former Smoker. Aortic                 stenosis. PVD. Carotid artery stenosis. Takotsubo                 cardiomyopathy. Pericardial effusion. Orthostatic hypotension.                 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 Procedure Date: 08/03/2021.  Sonographer:    ADiamond NickelRCS Referring Phys: [id number] 1. On image 60, there is high velocity color flow seen extending into the LV septum. Cannot exclude VSD. Left ventricular ejection fraction, by estimation, is 65 to 70%. The left ventricle has normal function. The left ventricle has no regional wall motion abnormalities. There is severe asymmetric left ventricular hypertrophy of the septal segment. Left ventricular diastolic parameters are consistent with Grade I diastolic dysfunction (impaired relaxation).  2. Right  ventricular systolic function is normal. The right ventricular size is normal. There  is normal pulmonary artery systolic pressure.  3. Left atrial size was moderately dilated.  4. The mitral valve is degenerative. Trivial mitral valve regurgitation. Mild mitral stenosis. Severe mitral annular calcification.  5. Tricuspid valve regurgitation is mild to moderate.  6. The aortic valve has been repaired/replaced. Aortic valve regurgitation is trivial. 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 Procedure Date: 08/03/2021. Aortic regurgitation PHT measures 199 msec. Aortic valve area, by VTI measures 3.18 cm. Aortic valve mean gradient measures 14.3 mmHg. Aortic valve Vmax measures 2.43 m/s.  7. The inferior vena cava is normal in size with greater than 50% respiratory variability, suggesting right atrial pressure of 3 mmHg. Comparison(s): Prior images reviewed side by side. Conclusion(s)/Recommendation(s): S/P TAVR. There is trivial perivalvular leak seen with color flow and CW Doppler. There is again high velocity color flow seen in the ventricular septum, cannot exclude small VSD. FINDINGS  Left Ventricle: On image 60, there is high velocity color flow seen extending into the LV septum. Cannot exclude VSD. Left ventricular ejection fraction, by estimation, is 65 to 70%. The left ventricle has normal function. The left ventricle has no regional wall motion abnormalities. The left ventricular internal cavity size was small. There is severe asymmetric left ventricular hypertrophy of the septal segment. Left ventricular diastolic parameters are consistent with Grade I diastolic dysfunction (impaired relaxation). Right Ventricle: The right ventricular size is normal. No increase in right ventricular wall thickness. Right ventricular systolic function is normal. There is normal pulmonary artery systolic pressure. The tricuspid regurgitant velocity is 2.53 m/s, and  with an assumed right  atrial pressure of 3 mmHg, the estimated right ventricular systolic pressure is [id number] mmHg. Left Atrium: Left atrial size was moderately dilated. Right Atrium: Right atrial size was normal in size. Pericardium: There is no evidence of pericardial effusion. Mitral Valve: The mitral valve is degenerative in appearance. Severe mitral annular calcification. Trivial mitral valve regurgitation. Mild mitral valve stenosis. The mean mitral valve gradient is 6.0 mmHg with average heart rate of 88 bpm. Tricuspid Valve: The tricuspid valve is normal in structure. Tricuspid valve regurgitation is mild to moderate. No evidence of tricuspid stenosis. Aortic Valve: There is an echolucent space in the aortic root on image 4, with minimal color flow noted. This may be flow between the TAVR prosthesis and the native root. There is high velocity color flow at 9 o'clock on short axis view, which could be regurgitation vs. VSD. There is definitive aortic regurgitation seen on image 57. The aortic valve has been repaired/replaced. Aortic valve regurgitation is trivial. Aortic regurgitation PHT measures 199 msec. Aortic valve mean gradient measures 14.3 mmHg. Aortic valve peak gradient measures 23.6 mmHg. Aortic valve area, by VTI measures 3.18 cm. 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 Procedure Date: 08/03/2021. Pulmonic Valve: The pulmonic valve was not well visualized. Pulmonic valve regurgitation is trivial. No evidence of pulmonic stenosis. Aorta: The aortic root, ascending aorta, aortic arch and descending aorta are all structurally normal, with no evidence of dilitation or obstruction. Venous: The inferior vena cava is normal in size with greater than 50% respiratory variability, suggesting right atrial pressure of 3 mmHg. IAS/Shunts: The atrial septum is grossly normal.  LEFT VENTRICLE PLAX 2D LVIDd:         3.00 cm   Diastology LVIDs:         1.70 cm   LV e' medial:  5.98 cm/s  LV PW:         1.30 cm   LV E/e' medial:  15.4 LV IVS:        2.10 cm   LV e' lateral:   6.31 cm/s LVOT diam:     2.30 cm   LV E/e' lateral: 14.6 LV SV:         142 LV SV Index:   95 LVOT Area:     4.15 cm  RIGHT VENTRICLE RV Basal diam:  3.10 cm RV S prime:     13.50 cm/s TAPSE (M-mode): 1.8 cm LEFT ATRIUM           Index        RIGHT ATRIUM           Index LA diam:      4.10 cm 2.75 cm/m   RA Area:     11.30 cm LA Vol (A2C): 51.3 ml 34.46 ml/m  RA Volume:   24.40 ml  16.39 ml/m LA Vol (A4C): 21.2 ml 14.24 ml/m  AORTIC VALVE AV Area (Vmax):    2.99 cm AV Area (Vmean):   2.74 cm AV Area (VTI):     3.18 cm AV Vmax:           243.00 cm/s AV Vmean:          174.667 cm/s AV VTI:            0.447 m AV Peak Grad:      23.6 mmHg AV Mean Grad:      14.3 mmHg LVOT Vmax:         175.00 cm/s LVOT Vmean:        115.000 cm/s LVOT VTI:          0.342 m LVOT/AV VTI ratio: 0.76 AI PHT:            199 msec  AORTA Ao Asc diam: 3.70 cm MITRAL VALVE                TRICUSPID VALVE MV Area (PHT): 1.76 cm     TR Peak grad:   25.6 mmHg MV Mean grad:  6.0 mmHg     TR Vmax:        253.00 cm/s MV Decel Time: 431 msec MR Peak grad: 96.0 mmHg     SHUNTS MR Mean grad: 41.0 mmHg     Systemic VTI:  0.34 m MR Vmax:      490.00 cm/s   Systemic Diam: 2.30 cm MR Vmean:     270.0 cm/s MV E velocity: 92.00 cm/s MV A velocity: 149.00 cm/s MV E/A ratio:  0.62 Buford Dresser MD Electronically signed by Buford Dresser MD Signature Date/Time: 08/10/2022/6:39:01 PM    Final     Assessment/Plan  1. Acute non-recurrent frontal sinusitis -  has been having chills, cough, frontal and nasal tenderness - saccharomyces boulardii (FLORASTOR) 250 MG capsule; Take 1 capsule (250 mg total) by mouth 2 (two) times daily for 10 days.  Dispense: 20 capsule; Refill: 0 - amoxicillin-clavulanate (AUGMENTIN) 875-125 MG tablet; Take 1 tablet by mouth 2 (two) times daily for 7 days.  Dispense: 14 tablet; Refill: 0  2. History of CVA  (cerebrovascular accident) -  no weakness, stable -  continue Pradaxa and Rosuvastatin  3. Major depression, recurrent, chronic (HCC) -  mood is improved -  continue Lexapro and Mirtazapine   Labs/tests ordered:   None  Next appt:  09/26/2022

## 2022-09-08 NOTE — Patient Instructions (Signed)

## 2022-09-12 ENCOUNTER — Telehealth: Payer: Self-pay

## 2022-09-12 NOTE — Telephone Encounter (Signed)
Patient seen Barbara Mcconnell on Thursday and her symptoms have progressed. Patient with:  Productive cough (yellow/pinkish phlegm)  Chest congestion Diarrhea Dehydration possibly related to diarrhea   Patients daughter Barbara Mcconnell is questioning if patient need to continue ABX and see how it goes, be re-evaluated, or what course of action needs to be taken.  Please advise

## 2022-09-12 NOTE — Telephone Encounter (Signed)
Spoke with [place] and relayed Barbara's response:  Medina-Vargas, Barbara C, NP  You1 hour ago (1:39 PM)    Needs to continue antibiotics. Please start taking probiotic daily. Is she eating?   Patient is taking probiotic as you prescribed it when you prescribed the antibiotic. Patients daughter would like to confirm that you do not want to do anything  about her other symptoms as she was not having them when she see you. Patient has had four days of antibiotic.

## 2022-09-13 NOTE — Telephone Encounter (Signed)
Below is [place]'s reply:  Medina-Vargas, Senaida Lange, NP  You30 minutes ago (9:16 AM)    Talked to daughter, Earnest Bailey, and addressed all questions. I advised her to continue taking antibiotics and probiotic. Patient's BP is around 120. Patient is eating and drinking per daughter.

## 2022-09-15 ENCOUNTER — Ambulatory Visit (INDEPENDENT_AMBULATORY_CARE_PROVIDER_SITE_OTHER): Payer: Medicare Other | Admitting: Adult Health

## 2022-09-15 ENCOUNTER — Ambulatory Visit
Admission: RE | Admit: 2022-09-15 | Discharge: 2022-09-15 | Disposition: A | Discharge status: Home / Self Care | Payer: Medicare Other | Source: Ambulatory Visit | Attending: Adult Health | Admitting: Adult Health

## 2022-09-15 ENCOUNTER — Encounter: Payer: Self-pay | Admitting: Adult Health

## 2022-09-15 VITALS — BP 130/60 | HR 90 | Temp 98.0°F | Resp 18 | Ht 61.0 in | Wt 109.0 lb

## 2022-09-15 DIAGNOSIS — R600 Localized edema: Secondary | ICD-10-CM

## 2022-09-15 DIAGNOSIS — R051 Acute cough: Secondary | ICD-10-CM

## 2022-09-15 DIAGNOSIS — J0110 Acute frontal sinusitis, unspecified: Secondary | ICD-10-CM

## 2022-09-15 DIAGNOSIS — R040 Epistaxis: Secondary | ICD-10-CM

## 2022-09-15 DIAGNOSIS — I6523 Occlusion and stenosis of bilateral carotid arteries: Secondary | ICD-10-CM

## 2022-09-15 MED ORDER — FUROSEMIDE 40 MG PO TABS: 40.0000 mg | ORAL_TABLET | Freq: Every day | ORAL | 0 refills | Status: DC

## 2022-09-15 MED ORDER — OXYMETAZOLINE HCL 0.05 % NA SOLN: 2.0000 | Freq: Two times a day (BID) | NASAL | 0 refills | Status: DC | PRN

## 2022-09-15 NOTE — Patient Instructions (Signed)
Apply pressure for 30 min if nasal bleeding May use afrin 2 sprays If still bleeding go to ER

## 2022-09-15 NOTE — Progress Notes (Addendum)
Location:  [facility]  POS: Clinic  Provider: Royal Hawthorn, ANP   Goals of Care:     06/28/2022    1:36 PM  Advanced Directives  Does Patient Have a Medical Advance Directive? Yes  Type of Paramedic of [place];Living will  Does patient want to make changes to medical advance directive? No - Patient declined  Copy of [place] in Chart? Yes - validated most recent copy scanned in chart (See row information)     Chief Complaint  Patient presents with   Acute Visit    Patient is being seen for Epistaxis    HPI: Patient is a 88 y.o. female seen today for medical management of chronic diseases.    Completed augmentin for a sinus infection started 09/08/22.  Sinus pressure is gone. Feels tired. Coughing is worse. Yellow sputum   She was having diarrhea while on augmentin, which went away one day ago  Nose bleed copious for the past 30 hrs on the right off and on. They called EMS and it took 1hr to get it to stop. BP was 150/90 during nose bleed.  Has been using saline ayr in her nose to help.   She is not sleeping due to nose bleed, lost 2 lbs because she is not eating well. Loss of appetite due to nose bleed.   Also has some leg edema and hard to get shoes on  Past Medical History:  Diagnosis Date   Allergies    Anxiety    panic attacks   Aortic stenosis    Arthritis    Bronchiectasis with (acute) exacerbation (HCC)    CAD (coronary artery disease)    non obst CAD   Depression due to acute stroke (HCC)    Dizziness    Elevated liver enzymes    Fall    Fluid overload    GERD (gastroesophageal reflux disease)    Heart murmur    Hemorrhoids    History of mammogram    Hyperlipemia    Hypertension    Internal carotid artery stenosis    OCD (obsessive compulsive disorder)    Orthostatic hypotension    Other cirrhosis of liver (HCC)    PAF (paroxysmal atrial fibrillation) (HCC)    Pericardial effusion    Physical  deconditioning    Renal insufficiency    Rheumatic heart failure (congestive) (HCC)    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 Ali Lowe & Dr. Cyndia Bent   Seasonal allergies    Severe aortic stenosis    Stroke [facility])    Takotsubo cardiomyopathy     Past Surgical History:  Procedure Laterality Date   COLONOSCOPY     EYE SURGERY Bilateral    cataracts removed   INTRAOPERATIVE TRANSTHORACIC ECHOCARDIOGRAM N/A 08/03/2021   Procedure: INTRAOPERATIVE TRANSTHORACIC ECHOCARDIOGRAM;  Surgeon: Early Osmond, MD;  Location: [place] [facility];  Service: Open Heart Surgery;  Laterality: N/A;   IR CT HEAD LTD  03/25/2022   IR PERCUTANEOUS ART THROMBECTOMY/INFUSION INTRACRANIAL INC DIAG ANGIO  03/25/2022   IR US GUIDE VASC ACCESS RIGHT  03/25/2022   PERICARDIOCENTESIS N/A 07/28/2021   Procedure: PERICARDIOCENTESIS;  Surgeon: Early Osmond, MD;  Location: [place] [facility];  Service: Cardiovascular;  Laterality: N/A;   RADIOLOGY WITH ANESTHESIA N/A 03/25/2022   Procedure: IR WITH ANESTHESIA;  Surgeon: Radiologist, Medication, MD;  Location: [place];  Service: Radiology;  Laterality: N/A;   RIGHT/LEFT HEART CATH AND CORONARY ANGIOGRAPHY N/A 05/13/2019   Procedure: RIGHT/LEFT HEART CATH AND CORONARY ANGIOGRAPHY;  Surgeon: Martinique, Peter M, MD;  Location: [place] [facility];  Service: Cardiovascular;  Laterality: N/A;   RIGHT/LEFT HEART CATH AND CORONARY ANGIOGRAPHY N/A 07/23/2021   Procedure: RIGHT/LEFT HEART CATH AND CORONARY ANGIOGRAPHY;  Surgeon: Jettie Booze, MD;  Location: [place] [facility];  Service: Cardiovascular;  Laterality: N/A;   TONSILLECTOMY     TRANSCATHETER AORTIC VALVE REPLACEMENT, TRANSFEMORAL N/A 08/03/2021   Procedure: TRANSCATHETER AORTIC VALVE REPLACEMENT, TRANSFEMORAL;  Surgeon: Early Osmond, MD;  Location: [place] [facility];  Service: Open Heart Surgery;  Laterality: N/A;   UPPER GI  ENDOSCOPY      Allergies  Allergen Reactions   Codeine Nausea And Vomiting   Cyclobenzaprine Other (See Comments)    Unknown reaction   Iodinated Contrast Media Nausea Only   Prednisone Other (See Comments)    "Sores in the mouth"    Outpatient Encounter Medications as of 09/15/2022  Medication Sig   acetaminophen (TYLENOL) 500 MG tablet Take 500-1,000 mg by mouth every 6 (six) hours as needed for moderate pain.   albuterol (VENTOLIN HFA) 108 (90 Base) MCG/ACT inhaler Inhale 2 puffs into the lungs every 4 (four) hours as needed for wheezing or shortness of breath (seasonal allergies).   dabigatran (PRADAXA) 150 MG CAPS capsule Take 1 capsule (150 mg total) by mouth every 12 (twelve) hours.   docusate sodium (COLACE) 100 MG capsule Take 100 mg by mouth 2 (two) times daily.   escitalopram (LEXAPRO) 10 MG tablet Take 1 tablet (10 mg total) by mouth daily.   famotidine (PEPCID) 10 MG tablet Take 10 mg by mouth daily as needed for heartburn.   furosemide (LASIX) 20 MG tablet TAKE (1) TABLET BY MOUTH ONCE DAILY IN THE MORNING.   Iron, Ferrous Sulfate, 325 (65 Fe) MG TABS Take 325 mg by mouth 3 (three) times a week. On Monday,Wednesday and Friday   lisinopril (ZESTRIL) 5 MG tablet Take 0.5 tablets (2.5 mg total) by mouth daily.   mirtazapine (REMERON) 30 MG tablet Take 30 mg by mouth at bedtime.   montelukast (SINGULAIR) 10 MG tablet Take 1 tablet (10 mg total) by mouth daily.   Multiple Vitamins-Minerals (EMERGEN-C VITAMIN C PO) Take 1 packet by mouth daily. Mix with water   rosuvastatin (CRESTOR) 20 MG tablet Take 1 tablet (20 mg total) by mouth daily.   saccharomyces boulardii (FLORASTOR) 250 MG capsule Take 1 capsule (250 mg total) by mouth 2 (two) times daily for 10 days.   Trolamine Salicylate (ASPERCREME EX) Apply 1 application topically daily as needed (knee pain).   [DISCONTINUED] amoxicillin-clavulanate (AUGMENTIN) 875-125 MG tablet Take 1 tablet by mouth 2 (two) times daily for 7  days.   fexofenadine (ALLEGRA) 60 MG tablet Take 1 tablet (60 mg total) by mouth daily.   No facility-administered encounter medications on file as of 09/15/2022.    Review of Systems:  Review of Systems  Constitutional:  Positive for fatigue. Negative for activity change, appetite change, chills, diaphoresis, fever and unexpected weight change.  HENT:  Positive for congestion.        Nose bleed  Respiratory:  Positive for cough. Negative for shortness of breath and wheezing.   Cardiovascular:  Positive for leg swelling. Negative for chest pain and palpitations.  Gastrointestinal:  Negative for abdominal distention, abdominal pain, constipation and diarrhea.  Genitourinary:  Negative for  difficulty urinating and dysuria.  Musculoskeletal:  Negative for arthralgias, back pain, gait problem, joint swelling and myalgias.  Neurological:  Negative for dizziness, tremors, seizures, syncope, facial asymmetry, speech difficulty, weakness, light-headedness, numbness and headaches.  Psychiatric/Behavioral:  Negative for agitation, behavioral problems and confusion.     Health Maintenance  Topic Date Due   DEXA SCAN  Never done   COVID-19 Vaccine (6 - 2023-24 season) 03/25/2022   Medicare Annual Wellness (AWV)  06/15/2022   DTaP/Tdap/Td (2 - Td or Tdap) 07/14/2024   Pneumonia Vaccine 43+ Years old  Completed   INFLUENZA VACCINE  Completed   Zoster Vaccines- Shingrix  Completed   HPV VACCINES  Aged Out    Physical Exam: Vitals:   09/15/22 1100  BP: 130/60  Pulse: 90  Resp: 18  Temp: 98 F (36.7 C)  SpO2: 100%  Weight: 109 lb (49.4 kg)  Height: '5\' 1"'$  (1.549 m)   Body mass index is 20.6 kg/m. Weight: 109 lb (49.4 kg)  Weight: 109 lb (49.4 kg)  Weight: 109 lb (49.4 kg)   Wt Readings from Last 3 Encounters:  09/15/22 109 lb (49.4 kg)  09/08/22 111 lb 9.6 oz (50.6 kg)  08/29/22 111 lb (50.3 kg)    Physical Exam Vitals and nursing note reviewed.  Constitutional:      General:  She is not in acute distress.    Appearance: She is not diaphoretic.  HENT:     Head: Normocephalic and atraumatic.     Nose: Nose normal.     Comments: Right nare with swollen septum Right nare with clot No purulent drainage or ulceration. No fully visible due to blood    Mouth/Throat:     Mouth: Mucous membranes are moist.     Pharynx: No oropharyngeal exudate.     Comments: Right tonsillar slightly prominent. Blood posteriorly noted.  Neck:     Vascular: No JVD.  Cardiovascular:     Rate and Rhythm: Normal rate and regular rhythm.     Heart sounds: No murmur heard. Pulmonary:     Effort: Pulmonary effort is normal. No respiratory distress.     Breath sounds: Normal breath sounds. No wheezing.  Musculoskeletal:     Cervical back: No rigidity or tenderness.     Comments: BLE edema +1  Lymphadenopathy:     Cervical: No cervical adenopathy.  Skin:    General: Skin is warm and dry.  Neurological:     Mental Status: She is alert and oriented to person, place, and time.     Labs reviewed: Basic Metabolic Panel: Recent Labs    02/15/22 1119 03/25/22 1059 05/10/22 1532 05/11/22 0958 05/13/22 0358 05/19/22 1517 08/08/22 1413  NA 136   < > 133*   < > 134* 136 138  K 4.4   < > 4.1   < > 3.5 4.6 4.5  CL 99   < > 98   < > 100 102 104  CO2 31   < > 28   < > '25 25 26  '$ GLUCOSE 87   < > 92   < > 103* 87 87  BUN 20   < > 19   < > '15 14 16  '$ CREATININE 0.89   < > 0.96   < > 1.02* 0.95 1.24*  CALCIUM 9.5   < > 10.0   < > 9.1 9.3 9.4  MG  --   --   --   --  2.1  --   --  TSH 1.33  --  0.623  --   --   --   --    < > = values in this interval not displayed.   Liver Function Tests: Recent Labs    05/10/22 1532 05/11/22 0958 05/13/22 0358 05/19/22 1517 08/08/22 1413  AST 53* 50* 38 48* 45*  ALT '30 26 22 23 19  '$ ALKPHOS 53 47 41  --   --   BILITOT 1.5* 1.7* 1.3* 1.2 1.0  PROT 7.7 6.1* 5.6* 6.4 6.3  ALBUMIN 4.3 3.3* 3.1*  --   --    No results for input(s): "LIPASE",  "AMYLASE" in the last 8760 hours. Recent Labs    05/10/22 1532 05/19/22 1517  AMMONIA 19 <10   CBC: Recent Labs    05/10/22 1532 05/12/22 0311 05/19/22 1517 08/08/22 1413  WBC 8.0 7.4 6.1 6.3  NEUTROABS 5.7  --  3,129 3,522  HGB 10.8* 8.8* 9.6* 8.9*  HCT 34.2* 27.0* 30.2* 27.8*  MCV 91.7 90.6 92.1 88.5  PLT 280 216 269 273   Lipid Panel: Recent Labs    02/15/22 1119 03/26/22 0137  CHOL 204* 204*  HDL 85 86  LDLCALC 95 103*  TRIG 137 77  CHOLHDL 2.4 2.4   Lab Results  Component Value Date   HGBA1C 5.4 03/26/2022    Procedures since last visit: No results found.  Assessment/Plan  1. Epistaxis Resolved Small clot in right nare Nasal septum is swollen but no purulent matter   - oxymetazoline (AFRIN NASAL SPRAY) 0.05 % nasal spray; Place 2 sprays into both nostrils 2 (two) times daily as needed. 2 sprays BID prn nose nose bleed  Dispense: 30 mL; Refill: 0  - Ambulatory referral to ENT  Apply pressure for 30 min if nasal bleeding May use afrin 2 sprays If still bleeding go to ER  Recommend humidification and saline ayr for dryness  2. Acute cough  - DG Chest 2 View; Future  CXR did not show an acute infection Does have atelectasis and scarring  3. Lower extremity edema Increase lasix to 40 mg daily for 2 days due to edema and then return to 20 mg   4. Acute non-recurrent frontal sinusitis Appears to have resolved but does have some inflammation in the right nare Referral to ENT   Labs/tests ordered:  * No order type specified * Next appt:  09/26/2022   Total time 26mn:  time greater than 50% of total time spent doing pt counseling and coordination of care

## 2022-09-20 ENCOUNTER — Ambulatory Visit: Payer: Medicare HMO | Admitting: Adult Health

## 2022-09-23 ENCOUNTER — Telehealth: Payer: Self-pay

## 2022-09-23 NOTE — Telephone Encounter (Signed)
Patient daughter called and states that patient was seen by ENT today and [facility] will fax over medications to be signed off by PCP Sabra Heck Lillette Boxer, MD

## 2022-09-25 ENCOUNTER — Encounter: Payer: Self-pay | Admitting: Adult Health

## 2022-09-26 ENCOUNTER — Ambulatory Visit (INDEPENDENT_AMBULATORY_CARE_PROVIDER_SITE_OTHER): Payer: Medicare Other | Admitting: Adult Health

## 2022-09-26 ENCOUNTER — Encounter: Payer: Self-pay | Admitting: Adult Health

## 2022-09-26 VITALS — BP 120/68 | HR 86 | Temp 97.1°F | Resp 18 | Ht 61.0 in | Wt 109.0 lb

## 2022-09-26 DIAGNOSIS — Z0000 Encounter for general adult medical examination without abnormal findings: Secondary | ICD-10-CM | POA: Diagnosis not present

## 2022-09-26 NOTE — Progress Notes (Signed)
Subjective:   Barbara Mcconnell is a 88 y.o. female who presents for Medicare Annual (Subsequent) preventive examination.  Review of Systems        Objective:    Today's Vitals   09/26/22 1514  BP: 120/68  Pulse: 86  Resp: 18  Temp: (!) 97.1 F (36.2 C)  SpO2: 98%  Weight: 109 lb (49.4 kg)  Height: '5\' 1"'$  (1.549 m)   Body mass index is 20.6 kg/m.     09/26/2022    9:02 AM 06/28/2022    1:36 PM 06/22/2022    1:34 PM 05/10/2022    3:18 PM 10/26/2021    3:06 PM 09/15/2021   10:23 AM 08/03/2021   10:08 AM  Advanced Directives  Does Patient Have a Medical Advance Directive? Yes Yes Yes No Yes Yes No  Type of Paramedic of [place];Living will [place];Living will [place];Living will  [place];Living will [place];Living will   Does patient want to make changes to medical advance directive? No - Patient declined No - Patient declined No - Patient declined  No - Patient declined No - Patient declined No - Patient declined  Copy of [place] in Chart? Yes - validated most recent copy scanned in chart (See row information) Yes - validated most recent copy scanned in chart (See row information) Yes - validated most recent copy scanned in chart (See row information)  Yes - validated most recent copy scanned in chart (See row information) Yes - validated most recent copy scanned in chart (See row information)   Would patient like information on creating a medical advance directive?    No - Patient declined   No - Patient declined    Current Medications (verified) Outpatient Encounter Medications as of 09/26/2022  Medication Sig   acetaminophen (TYLENOL) 500 MG tablet Take 500-1,000 mg by mouth every 6 (six) hours as needed for moderate pain.   albuterol (VENTOLIN HFA) 108 (90 Base) MCG/ACT inhaler Inhale 2 puffs into the lungs every 4 (four) hours as needed for wheezing or  shortness of breath (seasonal allergies).   dabigatran (PRADAXA) 150 MG CAPS capsule Take 1 capsule (150 mg total) by mouth every 12 (twelve) hours.   docusate sodium (COLACE) 100 MG capsule Take 100 mg by mouth 2 (two) times daily.   escitalopram (LEXAPRO) 10 MG tablet Take 1 tablet (10 mg total) by mouth daily.   famotidine (PEPCID) 10 MG tablet Take 10 mg by mouth 2 (two) times daily.   furosemide (LASIX) 20 MG tablet TAKE (1) TABLET BY MOUTH ONCE DAILY IN THE MORNING.   Iron, Ferrous Sulfate, 325 (65 Fe) MG TABS Take 325 mg by mouth 3 (three) times a week. On Monday,Wednesday and Friday   lisinopril (ZESTRIL) 5 MG tablet Take 0.5 tablets (2.5 mg total) by mouth daily.   mirtazapine (REMERON) 30 MG tablet Take 30 mg by mouth at bedtime.   montelukast (SINGULAIR) 10 MG tablet Take 1 tablet (10 mg total) by mouth daily.   Multiple Vitamins-Minerals (EMERGEN-C VITAMIN C PO) Take 1 packet by mouth daily. Mix with water   oxymetazoline (AFRIN NASAL SPRAY) 0.05 % nasal spray Place 2 sprays into both nostrils 2 (two) times daily as needed. 2 sprays BID prn nose nose bleed   rosuvastatin (CRESTOR) 20 MG tablet Take 1 tablet (20 mg total) by mouth daily.   Trolamine Salicylate (ASPERCREME EX) Apply 1 application topically daily  as needed (knee pain).   fexofenadine (ALLEGRA) 60 MG tablet Take 1 tablet (60 mg total) by mouth daily.   furosemide (LASIX) 40 MG tablet Take 1 tablet (40 mg total) by mouth daily for 2 days. Hold lasix 20 mg daily while taking 40 mg   No facility-administered encounter medications on file as of 09/26/2022.    Allergies (verified) Codeine, Cyclobenzaprine, Iodinated contrast media, and Prednisone   History: Past Medical History:  Diagnosis Date   Allergies    Anxiety    panic attacks   Aortic stenosis    Arthritis    Bronchiectasis with (acute) exacerbation (HCC)    CAD (coronary artery disease)    non obst CAD   Depression due to acute stroke (HCC)    Dizziness     Elevated liver enzymes    Fall    Fluid overload    GERD (gastroesophageal reflux disease)    Heart murmur    Hemorrhoids    History of mammogram    Hyperlipemia    Hypertension    Internal carotid artery stenosis    OCD (obsessive compulsive disorder)    Orthostatic hypotension    Other cirrhosis of liver (HCC)    PAF (paroxysmal atrial fibrillation) (HCC)    Pericardial effusion    Physical deconditioning    Renal insufficiency    Rheumatic heart failure (congestive) (HCC)    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 Ali Lowe & Dr. Cyndia Bent   Seasonal allergies    Severe aortic stenosis    Stroke [facility])    Takotsubo cardiomyopathy    Past Surgical History:  Procedure Laterality Date   COLONOSCOPY     EYE SURGERY Bilateral    cataracts removed   INTRAOPERATIVE TRANSTHORACIC ECHOCARDIOGRAM N/A 08/03/2021   Procedure: INTRAOPERATIVE TRANSTHORACIC ECHOCARDIOGRAM;  Surgeon: Early Osmond, MD;  Location: [place] [facility];  Service: Open Heart Surgery;  Laterality: N/A;   IR CT HEAD LTD  03/25/2022   IR PERCUTANEOUS ART THROMBECTOMY/INFUSION INTRACRANIAL INC DIAG ANGIO  03/25/2022   IR US GUIDE VASC ACCESS RIGHT  03/25/2022   PERICARDIOCENTESIS N/A 07/28/2021   Procedure: PERICARDIOCENTESIS;  Surgeon: Early Osmond, MD;  Location: [place] [facility];  Service: Cardiovascular;  Laterality: N/A;   RADIOLOGY WITH ANESTHESIA N/A 03/25/2022   Procedure: IR WITH ANESTHESIA;  Surgeon: Radiologist, Medication, MD;  Location: [place];  Service: Radiology;  Laterality: N/A;   RIGHT/LEFT HEART CATH AND CORONARY ANGIOGRAPHY N/A 05/13/2019   Procedure: RIGHT/LEFT HEART CATH AND CORONARY ANGIOGRAPHY;  Surgeon: Martinique, Peter M, MD;  Location: [place] [facility];  Service: Cardiovascular;  Laterality: N/A;   RIGHT/LEFT HEART CATH AND CORONARY ANGIOGRAPHY N/A 07/23/2021   Procedure: RIGHT/LEFT HEART CATH AND CORONARY  ANGIOGRAPHY;  Surgeon: Jettie Booze, MD;  Location: [place] [facility];  Service: Cardiovascular;  Laterality: N/A;   TONSILLECTOMY     TRANSCATHETER AORTIC VALVE REPLACEMENT, TRANSFEMORAL N/A 08/03/2021   Procedure: TRANSCATHETER AORTIC VALVE REPLACEMENT, TRANSFEMORAL;  Surgeon: Early Osmond, MD;  Location: [place] [facility];  Service: Open Heart Surgery;  Laterality: N/A;   UPPER GI ENDOSCOPY     Family History  Problem Relation Age of Onset   Cancer Sister    Lung cancer Sister    Alcohol abuse Sister    Melanoma Brother    Brain cancer Brother    Heart failure Brother  Diabetes Brother    Cancer Brother    Heart Problems Brother    Down syndrome Brother    Heart failure Brother    Endometriosis Daughter    Hypertension Other    Social History   Socioeconomic History   Marital status: Divorced    Spouse name: Not on file   Number of children: Not on file   Years of education: Not on file   Highest education level: Not on file  Occupational History   Not on file  Tobacco Use   Smoking status: Former    Packs/day: 1.00    Years: 20.00    Total pack years: 20.00    Types: Cigarettes   Smokeless tobacco: Never   Tobacco comments:    Quit 30 yrs ago as of 07/2021 per daughter  Vaping Use   Vaping Use: Never used  Substance and Sexual Activity   Alcohol use: Yes    Comment: occasiona wine   Drug use: Never   Sexual activity: Not Currently    Birth control/protection: Post-menopausal  Other Topics Concern   Not on file  Social History Narrative   Tobacco use, amount per day now: None   Past tobacco use, amount per day: Up to 1 pack   How many years did you use tobacco: 20 years.   Alcohol use (drinks per week): None.   Diet: 3 meals daily/mostly cooked at home.   Do you drink/eat things with caffeine: periodic diet coke.   Marital status:  Divorced.                                What year were you married? 1968   Do you live in a house, apartment,  assisted living, condo, trailer, etc.? One Level Townhome   Is it one or more stories? One Level   How many persons live in your home? One   Do you have pets in your home?( please list) No   Highest Level of education completed? High School   Current or past profession: Programmer, systems.    Do you exercise?   Yes when able.                               Type and how often? Silver Sneakers 2-3 times weekly.   Do you have a living will? Yes   Do you have a DNR form?        No                           If not, do you want to discuss one? Yes   Do you have signed POA/HPOA forms?   Yes                     If so, please bring to you appointment      Do you have any difficulty bathing or dressing yourself? Yes   Do you have any difficulty preparing food or eating? Yes   Do you have any difficulty managing your medications? Yes   Do you have any difficulty managing your finances? No   Do you have any difficulty affording your medications? No   Social Determinants of Health   Financial Resource Strain: Low Risk  (05/14/2019)   Overall Financial Resource Strain (CARDIA)    Difficulty of  Paying Living Expenses: Not hard at all  Food Insecurity: Unknown (05/14/2019)   Hunger Vital Sign    Worried About Running Out of Food in the Last Year: Patient refused    [place] in the Last Year: Patient refused  Transportation Needs: No Transportation Needs (05/14/2019)   PRAPARE - Hydrologist (Medical): No    Lack of Transportation (Non-Medical): No  Physical Activity: Not on file  Stress: Not on file  Social Connections: Unknown (05/14/2019)   Social Connection and Isolation Panel [NHANES]    Frequency of Communication with Friends and Family: More than three times a week    Frequency of Social Gatherings with Friends and Family: More than three times a week    Attends Religious Services: Never    Marine scientist or Organizations: Not on file     Attends Archivist Meetings: Not on file    Marital Status: Not on file    Tobacco Counseling Counseling given: Not Answered Tobacco comments: Quit 30 yrs ago as of 07/2021 per daughter   Clinical Intake:  Pre-visit preparation completed: No  Pain : No/denies pain     BMI - recorded: 21.09 Nutritional Status: BMI of 19-24  Normal Nutritional Risks: None Diabetes: No  How often do you need to have someone help you when you read instructions, pamphlets, or other written materials from your doctor or pharmacy?: 3 - Sometimes What is the last grade level you completed in school?: 12th grade  Diabetic? No  Interpreter Needed?: No  Information entered by :: Lanika Colgate Medina-Vargas NP   Activities of Daily Living     No data to display          Patient Care Team: Wardell Honour, MD as PCP - General (Family Medicine) Martinique, Peter M, MD as PCP - Cardiology (Cardiology) Charlaine Dalton, MD as Referring Physician  Indicate any recent Medical Services you may have received from other than [facility] providers in the past year (date may be approximate).     Assessment:   This is a routine wellness examination for [facility].  Hearing/Vision screen No results found.  Dietary issues and exercise activities discussed:     Goals Addressed   None    Depression Screen    09/26/2022    9:04 AM 06/02/2022    3:23 PM  PHQ 2/9 Scores  PHQ - 2 Score 0 3  PHQ- 9 Score  13  Exception Documentation Other- indicate reason in comment box Medical reason  Not completed annual wellness visit     Fall Risk    09/26/2022    9:03 AM 06/28/2022    1:36 PM 06/22/2022    1:33 PM 02/15/2022   10:26 AM 11/10/2021    9:58 AM  Fall Risk   Falls in the past year? 0 1 0 0 0  Number falls in past yr: 0 0 0 0 0  Injury with Fall? 0 1 0 0 0  Risk for fall due to : No Fall Risks History of fall(s);Impaired balance/gait;Impaired mobility No Fall Risks No Fall Risks No Fall Risks   Follow up Falls evaluation completed Falls evaluation completed;Education provided;Falls prevention discussed Falls evaluation completed  Falls evaluation completed    FALL RISK PREVENTION PERTAINING TO THE HOME:  Any stairs in or around the home? No  If so, are there any without handrails?  N/A Home free of loose throw rugs in walkways, pet beds,  electrical cords, etc? No  Adequate lighting in your home to reduce risk of falls? Yes   ASSISTIVE DEVICES UTILIZED TO PREVENT FALLS:  Life alert? Yes  Use of a cane, walker or w/c? Yes  Grab bars in the bathroom? Yes  Shower chair or bench in shower? Yes  Elevated toilet seat or a handicapped toilet? No   TIMED UP AND GO:  Was the test performed? No .  Length of time to ambulate 10 feet: N/A sec.   Gait steady and fast with assistive device  Cognitive Function:        Immunizations Immunization History  Administered Date(s) Administered   Fluad Quad(high Dose 65+) 04/07/2019, 05/13/2022   Influenza Split 05/10/2006, 04/25/2007, 04/08/2008, 11/10/2008   Influenza, High Dose Seasonal PF 04/20/2017, 04/07/2019, 03/25/2020, 04/27/2021   Influenza-Unspecified 05/10/2006, 04/25/2007, 04/08/2008, 07/09/2008, 11/10/2008, 03/31/2015, 04/20/2018, 04/07/2019   PFIZER Comirnaty(Gray Top)Covid-19 Tri-Sucrose Vaccine 08/14/2019, 09/04/2019, 04/20/2020, 10/25/2020, 04/12/2021   PPD Test 05/12/2022   Pneumococcal Conjugate-13 09/29/2015   Pneumococcal Polysaccharide-23 03/12/2007   Tdap 07/14/2014   Zoster Recombinat (Shingrix) 12/10/2019, 02/08/2020   Zoster, Live 08/18/2004, 01/17/2007    TDAP status: Up to date  Flu Vaccine status: Up to date  Pneumococcal vaccine status: Up to date  Covid-19 vaccine status: Information provided on how to obtain vaccines.   Qualifies for Shingles Vaccine? Yes   Zostavax completed No   Shingrix Completed?: Yes  Screening Tests Health Maintenance  Topic Date Due   DEXA SCAN  Never done    COVID-19 Vaccine (6 - 2023-24 season) 03/25/2022   Medicare Annual Wellness (AWV)  09/26/2023   DTaP/Tdap/Td (2 - Td or Tdap) 07/14/2024   Pneumonia Vaccine 39+ Years old  Completed   INFLUENZA VACCINE  Completed   Zoster Vaccines- Shingrix  Completed   HPV VACCINES  Aged Out    Health Maintenance  Health Maintenance Due  Topic Date Due   DEXA SCAN  Never done   COVID-19 Vaccine (6 - 2023-24 season) 03/25/2022    Colorectal cancer screening: Type of screening: Colonoscopy. Completed Declines. Repeat every N/A years  Mammogram status: Completed last year. Repeat every year  Bone Density status: Ordered Declines. Pt provided with contact info and advised to call to schedule appt.  Lung Cancer Screening: (Low Dose CT Chest recommended if Age 70-80 years, 30 pack-year currently smoking OR have quit w/in 15years.) does not qualify. Just completed 05/11/22  Lung Cancer Screening Referral: NO  Additional Screening:  Hepatitis C Screening: does not qualify; Completed N/A  Vision Screening: Recommended annual ophthalmology exams for early detection of glaucoma and other disorders of the eye. Is the patient up to date with their annual eye exam?  Yes  Who is the provider or what is the name of the office in which the patient attends annual eye exams? [facility] in [place] If pt is not established with a provider, would they like to be referred to a provider to establish care? No .   Dental Screening: Recommended annual dental exams for proper oral hygiene  Community Resource Referral / Chronic Care Management: CRR required this visit?  No   CCM required this visit?  No      Plan:     I have personally reviewed and noted the following in the patient's chart:   Medical and social history Use of alcohol, tobacco or illicit drugs  Current medications and supplements including opioid prescriptions. Patient is currently taking opioid prescriptions. Information provided to  patient  regarding non-opioid alternatives. Patient advised to discuss non-opioid treatment plan with their provider. Functional ability and status Nutritional status Physical activity Advanced directives List of other physicians Hospitalizations, surgeries, and ER visits in previous 12 months Vitals Screenings to include cognitive, depression, and falls Referrals and appointments  In addition, I have reviewed and discussed with patient certain preventive protocols, quality metrics, and best practice recommendations. A written personalized care plan for preventive services as well as general preventive health recommendations were provided to patient.     Lavaeh Bau Medina-Vargas, NP   09/26/2022   Nurse Notes:  Recommend to do this annually.

## 2022-09-27 ENCOUNTER — Other Ambulatory Visit: Payer: Self-pay

## 2022-09-27 ENCOUNTER — Encounter: Payer: Self-pay | Admitting: Adult Health

## 2022-10-01 ENCOUNTER — Encounter: Payer: Self-pay | Admitting: Cardiology

## 2022-10-04 ENCOUNTER — Other Ambulatory Visit: Payer: Self-pay | Admitting: Adult Health

## 2022-10-24 ENCOUNTER — Encounter (HOSPITAL_BASED_OUTPATIENT_CLINIC_OR_DEPARTMENT_OTHER): Payer: Self-pay | Admitting: Student

## 2022-10-24 ENCOUNTER — Ambulatory Visit (INDEPENDENT_AMBULATORY_CARE_PROVIDER_SITE_OTHER): Payer: Medicare Other | Admitting: Student

## 2022-10-24 ENCOUNTER — Ambulatory Visit (INDEPENDENT_AMBULATORY_CARE_PROVIDER_SITE_OTHER): Payer: Medicare Other

## 2022-10-24 ENCOUNTER — Telehealth: Payer: Self-pay

## 2022-10-24 DIAGNOSIS — S82831A Other fracture of upper and lower end of right fibula, initial encounter for closed fracture: Secondary | ICD-10-CM | POA: Diagnosis not present

## 2022-10-24 DIAGNOSIS — M7989 Other specified soft tissue disorders: Secondary | ICD-10-CM

## 2022-10-24 DIAGNOSIS — M25471 Effusion, right ankle: Secondary | ICD-10-CM

## 2022-10-24 NOTE — Telephone Encounter (Signed)
Anna Genre, Speech Therapist with [facility], called requesting verbal orders for speech therapy 1 time a week for 8 weeks.  Verbal order was given.

## 2022-10-24 NOTE — Progress Notes (Signed)
Chief Complaint: Right ankle pain and swelling     History of Present Illness:    Barbara Mcconnell is a 88 y.o. female presenting for evaluation of pain and swelling in her right ankle and foot.  Her daughter is with her today who assisted in providing the history.  This began yesterday when the patient was standing up from a seated position when her foot twisted inward.  Since then she has been having some pain that she describes as a dull ache and has been unable to walk normally.  She is ambulating with a walker at today's visit.  She has noticed significant swelling and some bruising in her ankle that goes down into her foot as well.  She has tried icing it as well as Tylenol for pain control.  She is unable to take NSAIDs.  Patient is a resident of [facility].   Surgical History:   None pertinent to right leg  PMH/PSH/Family History/Social History/Meds/Allergies:    Past Medical History:  Diagnosis Date   Allergies    Anxiety    panic attacks   Aortic stenosis    Arthritis    Bronchiectasis with (acute) exacerbation    CAD (coronary artery disease)    non obst CAD   Depression due to acute stroke    Dizziness    Elevated liver enzymes    Fall    Fluid overload    GERD (gastroesophageal reflux disease)    Heart murmur    Hemorrhoids    History of mammogram    Hyperlipemia    Hypertension    Internal carotid artery stenosis    OCD (obsessive compulsive disorder)    Orthostatic hypotension    Other cirrhosis of liver    PAF (paroxysmal atrial fibrillation)    Pericardial effusion    Physical deconditioning    Renal insufficiency    Rheumatic heart failure (congestive)    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 Ali Lowe & Dr. Cyndia Bent   Seasonal allergies    Severe aortic stenosis    Stroke    Takotsubo cardiomyopathy    Past Surgical History:   Procedure Laterality Date   COLONOSCOPY     EYE SURGERY Bilateral    cataracts removed   INTRAOPERATIVE TRANSTHORACIC ECHOCARDIOGRAM N/A 08/03/2021   Procedure: INTRAOPERATIVE TRANSTHORACIC ECHOCARDIOGRAM;  Surgeon: Early Osmond, MD;  Location: [facility];  Service: Open Heart Surgery;  Laterality: N/A;   IR CT HEAD LTD  03/25/2022   IR PERCUTANEOUS ART THROMBECTOMY/INFUSION INTRACRANIAL INC DIAG ANGIO  03/25/2022   IR US GUIDE VASC ACCESS RIGHT  03/25/2022   PERICARDIOCENTESIS N/A 07/28/2021   Procedure: PERICARDIOCENTESIS;  Surgeon: Early Osmond, MD;  Location: [facility];  Service: Cardiovascular;  Laterality: N/A;   RADIOLOGY WITH ANESTHESIA N/A 03/25/2022   Procedure: IR WITH ANESTHESIA;  Surgeon: Radiologist, Medication, MD;  Location: [facility];  Service: Radiology;  Laterality: N/A;   RIGHT/LEFT HEART CATH AND CORONARY ANGIOGRAPHY N/A 05/13/2019   Procedure: RIGHT/LEFT HEART CATH AND CORONARY ANGIOGRAPHY;  Surgeon: Martinique, Peter M, MD;  Location: [facility];  Service: Cardiovascular;  Laterality: N/A;   RIGHT/LEFT HEART CATH AND CORONARY ANGIOGRAPHY N/A 07/23/2021   Procedure: RIGHT/LEFT  HEART CATH AND CORONARY ANGIOGRAPHY;  Surgeon: Jettie Booze, MD;  Location: [facility];  Service: Cardiovascular;  Laterality: N/A;   TONSILLECTOMY     TRANSCATHETER AORTIC VALVE REPLACEMENT, TRANSFEMORAL N/A 08/03/2021   Procedure: TRANSCATHETER AORTIC VALVE REPLACEMENT, TRANSFEMORAL;  Surgeon: Early Osmond, MD;  Location: [facility];  Service: Open Heart Surgery;  Laterality: N/A;   UPPER GI ENDOSCOPY     Social History   Socioeconomic History   Marital status: Divorced    Spouse name: Not on file   Number of children: Not on file   Years of education: Not on file   Highest education level: Not on file  Occupational History   Not on file  Tobacco Use   Smoking status: Former    Packs/day: 1.00    Years: 20.00    Additional pack years: 0.00     Total pack years: 20.00    Types: Cigarettes   Smokeless tobacco: Never   Tobacco comments:    Quit 30 yrs ago as of 07/2021 per daughter  Vaping Use   Vaping Use: Never used  Substance and Sexual Activity   Alcohol use: Yes    Comment: occasiona wine   Drug use: Never   Sexual activity: Not Currently    Birth control/protection: Post-menopausal  Other Topics Concern   Not on file  Social History Narrative   Tobacco use, amount per day now: None   Past tobacco use, amount per day: Up to 1 pack   How many years did you use tobacco: 20 years.   Alcohol use (drinks per week): None.   Diet: 3 meals daily/mostly cooked at home.   Do you drink/eat things with caffeine: periodic diet coke.   Marital status:  Divorced.                                What year were you married? 1968   Do you live in a house, apartment, assisted living, condo, trailer, etc.? One Level Townhome   Is it one or more stories? One Level   How many persons live in your home? One   Do you have pets in your home?( please list) No   Highest Level of education completed? High School   Current or past profession: Programmer, systems.    Do you exercise?   Yes when able.                               Type and how often? Silver Sneakers 2-3 times weekly.   Do you have a living will? Yes   Do you have a DNR form?        No                           If not, do you want to discuss one? Yes   Do you have signed POA/HPOA forms?   Yes                     If so, please bring to you appointment      Do you have any difficulty bathing or dressing yourself? Yes   Do you have any difficulty preparing food or eating? Yes   Do you have any difficulty managing your medications? Yes   Do you have any  difficulty managing your finances? No   Do you have any difficulty affording your medications? No   Social Determinants of Health   Financial Resource Strain: Low Risk  (05/14/2019)   Overall Financial Resource Strain  (CARDIA)    Difficulty of Paying Living Expenses: Not hard at all  Food Insecurity: Unknown (05/14/2019)   Hunger Vital Sign    Worried About Running Out of Food in the Last Year: Patient declined    [place] in the Last Year: Patient declined  Transportation Needs: No Transportation Needs (05/14/2019)   PRAPARE - Hydrologist (Medical): No    Lack of Transportation (Non-Medical): No  Physical Activity: Not on file  Stress: Not on file  Social Connections: Unknown (05/14/2019)   Social Connection and Isolation Panel [NHANES]    Frequency of Communication with Friends and Family: More than three times a week    Frequency of Social Gatherings with Friends and Family: More than three times a week    Attends Religious Services: Never    Marine scientist or Organizations: Not on file    Attends Archivist Meetings: Not on file    Marital Status: Not on file   Family History  Problem Relation Age of Onset   Cancer Sister    Lung cancer Sister    Alcohol abuse Sister    Melanoma Brother    Brain cancer Brother    Heart failure Brother    Diabetes Brother    Cancer Brother    Heart Problems Brother    Down syndrome Brother    Heart failure Brother    Endometriosis Daughter    Hypertension Other    Allergies  Allergen Reactions   Codeine Nausea And Vomiting   Cyclobenzaprine Other (See Comments)    Unknown reaction   Iodinated Contrast Media Nausea Only   Prednisone Other (See Comments)    "Sores in the mouth"   Current Outpatient Medications  Medication Sig Dispense Refill   acetaminophen (TYLENOL) 500 MG tablet Take 500-1,000 mg by mouth every 6 (six) hours as needed for moderate pain.     albuterol (VENTOLIN HFA) 108 (90 Base) MCG/ACT inhaler Inhale 2 puffs into the lungs every 4 (four) hours as needed for wheezing or shortness of breath (seasonal allergies).     dabigatran (PRADAXA) 150 MG CAPS capsule Take 1 capsule (150  mg total) by mouth every 12 (twelve) hours. 60 capsule 5   docusate sodium (COLACE) 100 MG capsule Take 100 mg by mouth 2 (two) times daily.     escitalopram (LEXAPRO) 10 MG tablet Take 1 tablet (10 mg total) by mouth daily. 30 tablet 3   famotidine (ACID REDUCER) 10 MG tablet TAKE (1) TABLET BY MOUTH TWICE DAILY FOR GERD. 90 tablet 1   fexofenadine (ALLEGRA) 60 MG tablet Take 1 tablet (60 mg total) by mouth daily. 90 tablet 0   furosemide (LASIX) 20 MG tablet TAKE (1) TABLET BY MOUTH ONCE DAILY IN THE MORNING. 30 tablet 3   Iron, Ferrous Sulfate, 325 (65 Fe) MG TABS Take 325 mg by mouth 3 (three) times a week. On Monday,Wednesday and Friday 30 tablet 3   lisinopril (ZESTRIL) 5 MG tablet Take 0.5 tablets (2.5 mg total) by mouth daily. 30 tablet 3   mirtazapine (REMERON) 30 MG tablet Take 30 mg by mouth at bedtime.     montelukast (SINGULAIR) 10 MG tablet Take 1 tablet (10 mg total) by mouth  daily. 90 tablet 3   Multiple Vitamins-Minerals (EMERGEN-C VITAMIN C PO) Take 1 packet by mouth daily. Mix with water     oxymetazoline (AFRIN NASAL SPRAY) 0.05 % nasal spray Place 2 sprays into both nostrils 2 (two) times daily as needed. 2 sprays BID prn nose nose bleed 30 mL 0   rosuvastatin (CRESTOR) 20 MG tablet Take 1 tablet (20 mg total) by mouth daily. 90 tablet 3   Trolamine Salicylate (ASPERCREME EX) Apply 1 application topically daily as needed (knee pain).     No current facility-administered medications for this visit.   No results found.  Review of Systems:   A ROS was performed including pertinent positives and negatives as documented in the HPI.  Physical Exam :   Constitutional: NAD and appears stated age Neurological: Alert and oriented Psych: Appropriate affect and cooperative There were no vitals taken for this visit.   Comprehensive Musculoskeletal Exam:    Patient has significant soft tissue edema around the ankle joint extending down into her toes.  Bruising noted on lateral  ankle as well as medial aspect of foot.  Mild tenderness to palpation over lateral malleolus.  Active ankle range of motion 10 degrees dorsiflexion and 30 degrees plantarflexion with minimal discomfort.  Ankle mortise is stable without laxity.  Distal neurosensory exam intact.  Imaging:   Xray (right ankle 3 views, right foot 4 views): Nondisplaced distal fibula fracture below the syndesmosis.  No other evidence of fracture or dislocation.  I personally reviewed and interpreted the radiographs.   Assessment:   88 y.o. female presenting with significant ankle swelling and mild pain levels.  X-ray does show that she sustained a nondisplaced distal fibula fracture.  Her ankle joint remains stable.  I recommend that she be placed in a cam boot today.  Advised the patient and daughter that she can come out of the boot while nonweightbearing, but to use it while up and moving around.  Can continue to use the walker to minimize fall risk.  Also recommended to continue icing as tolerated for swelling and to take Tylenol for pain.  Will plan to see her back to assess healing in 4 weeks.  Plan :    -Return to clinic in 4 weeks for reevaluation    I personally saw and evaluated the patient, and participated in the management and treatment plan.  Marnee Spring, PA-C Orthopedics  This document was dictated using Systems analyst. A reasonable attempt at proof reading has been made to minimize errors.

## 2022-10-26 ENCOUNTER — Telehealth: Payer: Self-pay

## 2022-10-26 ENCOUNTER — Encounter: Payer: Self-pay | Admitting: Family Medicine

## 2022-10-26 NOTE — Telephone Encounter (Signed)
Incoming call received from [facility] with [facility] requesting verbal order for PT evaluation.  Per [employer] standing order, verbal order given. Message will be sent to patient's provider as a FYI.

## 2022-10-31 ENCOUNTER — Telehealth: Payer: Self-pay | Admitting: Adult Health

## 2022-10-31 NOTE — Telephone Encounter (Signed)
Called the daughter back. She states that since the follow up with Shanda Bumps the NP she has continued to have balance/gait difficulty. She had a horrible fall in October 2023, she had a severe fall and she broke her front teeth and hit her head but she was unaware of even breaking her teeth. She never complained of pain. The dentist was in shock over the fact that she never had pain. She went to ER and had a hematoma they monitored and nose bleeds.  Currently she is on visit 10 with the dentist in repairing her teeth. In the meantime around 4/1 she fell and broke her foot. But she had no idea that she broke her foot. The orthopedic was in shock they she was still getting around and no complaints of pain. She has been discharged  from OT and continues to work with ST. She has been in/out of PT 2-3 different times. She ends up getting set back up with PT every time she has a fall.  The main concern the daughter had was PCP, ortho, and dentist was concerned on whether her stroke is causing her to not have the feeling and sensation in order to recognize pain. They are concerned that something will happen and she may not know its happening like this previous events.  She is not scheduled for a follow up until August with Dr Pearlean Brownie although they are on the wait list. Advised I would in the meantime while waiting, I will send this concern to Dr Pearlean Brownie for him to review her chart and give input on whether anything can be further assessed in the meantime while waiting for her apt

## 2022-10-31 NOTE — Telephone Encounter (Signed)
Pt daughter called stated she needs to talk to nurse about pt being seen sooner than Aug. Stated pt is falling and broken all her tooth and pt can't even feel the missing teeth. Stated pt has a broken ankle now and she can't feel any pain or  anything. Stated the Orthopedic provider and dentist suggest that pt be seen ASAP by Neurologist.

## 2022-11-04 NOTE — Telephone Encounter (Signed)
Called the daughter and was able to move the patient up to a sooner appt for next week

## 2022-11-07 ENCOUNTER — Encounter: Payer: Self-pay | Admitting: Neurology

## 2022-11-07 ENCOUNTER — Encounter: Payer: Self-pay | Admitting: Family Medicine

## 2022-11-08 ENCOUNTER — Ambulatory Visit (INDEPENDENT_AMBULATORY_CARE_PROVIDER_SITE_OTHER): Payer: Medicare Other | Admitting: Neurology

## 2022-11-08 ENCOUNTER — Encounter: Payer: Self-pay | Admitting: Neurology

## 2022-11-08 VITALS — BP 147/74 | HR 75 | Ht 61.0 in | Wt 112.0 lb

## 2022-11-08 DIAGNOSIS — W19XXXA Unspecified fall, initial encounter: Secondary | ICD-10-CM

## 2022-11-08 DIAGNOSIS — Z8673 Personal history of transient ischemic attack (TIA), and cerebral infarction without residual deficits: Secondary | ICD-10-CM

## 2022-11-08 DIAGNOSIS — R413 Other amnesia: Secondary | ICD-10-CM | POA: Diagnosis not present

## 2022-11-08 NOTE — Patient Instructions (Addendum)
I had a long discussion with the patient and her daughter regarding her recent recurrent falls are likely multifactorial due to combination of her old stroke, cervical spine stenosis orthostatic hypotension.  I advised her to get up slowly and avoid sudden head and neck movements and to use her walker at all times.  Continue speech therapy and.  Physical and Occupational Therapy.  Continue Pradaxa for stroke prevention and maintain aggressive risk factor modification withBP goal<130/90, HLD with LDL goal<70 and DM with A1c.<6.5.  Check EMG nerve conduction study for neuropathy and EEG for seizures.  Return for follow-up in 4 months or call earlier if necessary

## 2022-11-08 NOTE — Progress Notes (Signed)
[facility] [address] [place]. [place] [address] [phone]       HOSPITAL FOLLOW UP NOTE  [name] Date of Birth:  1935/02/23 Medical Record Number:  [id number]   Reason for Referral:  hospital stroke follow up    SUBJECTIVE:   CHIEF COMPLAINT:  Chief Complaint  Patient presents with   Follow-up    Rm 3 with daughter [name] Pt is well , daughter sent detailed message via mychart 11/07/22     HPI:   Barbara Mcconnell is a 88 y.o. female with a past medical history anxiety, arthritis, CAD, GERD, heart murmur, hemorrhoids, HLD, HTN, afib on eliquis, TAVR, and takotsubo cardiomyopathy who presented on 03/25/2022 with word finding difficulties and confusion.  Personally reviewed hospitalization pertinent progress notes, lab work and imaging.  Stroke work-up revealed left MCA infarct with left M2 occlusion s/p IR with reocclusion of left M3, embolic secondary to A-fib even on Eliquis. Felt Eliquis failure therefore switched to Pradaxa  BID.  CTA showed right ICA 80 to 90% stenosis, chronic per family followed by PCP.  LDL 103, increase Crestor dosage from 5 mg to 20 mg daily.  No prior stroke history.  She was discharged to SNF for therapy recommendations.   Prior visit 04/28/2022,( JV) patient is being seen for initial hospital follow-up accompanied by her daughter who provides majority of history.  She continues to reside at [facility].  Reports residual right hand decreased dexterity, language difficulty, cognitive impairment and gait impairment.  Also notes continued fatigue.  Released by PT, is working with SLP and OT.  Continued use of RW at all times, no recent falls. Daughter reports OT mentioned left hand with slight decreased dexterity, possibly related to arthritis. Per daughter, patient being set up with cognitive testing.  Also recently increase Lexapro dosage. No new or worsening stroke/TIA symptoms. Daughter had multiple questions regarding her  residual deficits and further recovery, all answered to the best of my ability.  Per SNF MAR, remains on Pradaxa and Crestor, denies side effects.  Blood pressure elevated today routinely monitored at SNF and has been stable. She has since been seen by PCP and cardiology.  Is scheduled to see [facility] next week for carotid duplex for right ICA stenosis   Update 11/08/2022 : Patient is seen today upon request from the daughter due to concerns about recurrent falls and episodes of dizziness and imbalance..  She presented on the after an unwitnessed fall hitting her head on an upright column.  After the fall she was noted by her daughter to be confused from her baseline.  She has been having episodes of dizziness as well as other spells that are concerning for zoning out.  It was unclear with his episodes of zoning out as patient was presyncopal.  CT head was unremarkable and MRI also showed no acute infarct.  EEG as well as long-term EEG monitoring was negative for seizure activity.  Patient was felt to have orthostatic hypotension episodes (recent complaints.  The patient and her family later on was found to have several cracked teeth both sides by dentist and patient surprisingly no pain or feeling there.  Similarly and afterwards she fell and twisted her ankle and subsequently was found to have fracture but did not complain of significant pain raising orthopedics MD also to be concerned with history loss of sensation.  Patient denies any symptoms of tingling numbness in the feet or previous problems of gait ataxia.  He has no  known history of peripheral neuropathy.  She is currently getting speech therapy for ACDF from her previous stroke in September 2023.  Nursing home desperate to go home.  She currently has a right ankle boot is waiting for the fracture to heal.  She remains on Pradaxa for stroke prevention after her stroke in September 23 occurred on Eliquis and she was switched.  She does have minor bruising  but no bleeding.  Blood pressure numbers good control.  She is tolerating Crestor well without muscle aches and pains.  He is followed by cardiology for TAVR and last echo January 2024 showed EF greater than 75% with a mean gradient of 14 mmHg.  Cardiac telemetry monitoring from 05/13/2022 to 06/07/2022 showed 2 episodes of atrial flutter.  (+       ROS:   14 system review of systems performed and negative with exception of those listed in HPI  PMH:  Past Medical History:  Diagnosis Date   Allergies    Anxiety    panic attacks   Aortic stenosis    Arthritis    Bronchiectasis with (acute) exacerbation    CAD (coronary artery disease)    non obst CAD   Depression due to acute stroke    Dizziness    Elevated liver enzymes    Fall    Fluid overload    GERD (gastroesophageal reflux disease)    Heart murmur    Hemorrhoids    History of mammogram    Hyperlipemia    Hypertension    Internal carotid artery stenosis    OCD (obsessive compulsive disorder)    Orthostatic hypotension    Other cirrhosis of liver    PAF (paroxysmal atrial fibrillation)    Pericardial effusion    Physical deconditioning    Renal insufficiency    Rheumatic heart failure (congestive)    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 Lynnette Caffey & Dr. Laneta Simmers   Seasonal allergies    Severe aortic stenosis    Stroke    Takotsubo cardiomyopathy     PSH:  Past Surgical History:  Procedure Laterality Date   COLONOSCOPY     EYE SURGERY Bilateral    cataracts removed   INTRAOPERATIVE TRANSTHORACIC ECHOCARDIOGRAM N/A 08/03/2021   Procedure: INTRAOPERATIVE TRANSTHORACIC ECHOCARDIOGRAM;  Surgeon: Orbie Pyo, MD;  Location: [facility] INVASIVE CV LAB;  Service: Open Heart Surgery;  Laterality: N/A;   IR CT HEAD LTD  03/25/2022   IR PERCUTANEOUS ART THROMBECTOMY/INFUSION INTRACRANIAL INC DIAG ANGIO  03/25/2022   IR US GUIDE VASC ACCESS  RIGHT  03/25/2022   PERICARDIOCENTESIS N/A 07/28/2021   Procedure: PERICARDIOCENTESIS;  Surgeon: Orbie Pyo, MD;  Location: [facility] INVASIVE CV LAB;  Service: Cardiovascular;  Laterality: N/A;   RADIOLOGY WITH ANESTHESIA N/A 03/25/2022   Procedure: IR WITH ANESTHESIA;  Surgeon: Radiologist, Medication, MD;  Location: [facility] OR;  Service: Radiology;  Laterality: N/A;   RIGHT/LEFT HEART CATH AND CORONARY ANGIOGRAPHY N/A 05/13/2019   Procedure: RIGHT/LEFT HEART CATH AND CORONARY ANGIOGRAPHY;  Surgeon: [place], Peter M, MD;  Location: [facility] INVASIVE CV LAB;  Service: Cardiovascular;  Laterality: N/A;   RIGHT/LEFT HEART CATH AND CORONARY ANGIOGRAPHY N/A 07/23/2021   Procedure: RIGHT/LEFT HEART CATH AND CORONARY ANGIOGRAPHY;  Surgeon: Corky Crafts, MD;  Location: [facility] INVASIVE CV LAB;  Service: Cardiovascular;  Laterality: N/A;   TONSILLECTOMY     TRANSCATHETER AORTIC VALVE REPLACEMENT, TRANSFEMORAL N/A 08/03/2021  Procedure: TRANSCATHETER AORTIC VALVE REPLACEMENT, TRANSFEMORAL;  Surgeon: Orbie Pyo, MD;  Location: [facility] INVASIVE CV LAB;  Service: Open Heart Surgery;  Laterality: N/A;   UPPER GI ENDOSCOPY      Social History:  Social History   Socioeconomic History   Marital status: Divorced    Spouse name: Not on file   Number of children: Not on file   Years of education: Not on file   Highest education level: 12th grade  Occupational History   Not on file  Tobacco Use   Smoking status: Former    Packs/day: 1.00    Years: 20.00    Additional pack years: 0.00    Total pack years: 20.00    Types: Cigarettes   Smokeless tobacco: Never   Tobacco comments:    Quit 30 yrs ago as of 07/2021 per daughter  Vaping Use   Vaping Use: Never used  Substance and Sexual Activity   Alcohol use: Yes    Comment: occasiona wine   Drug use: Never   Sexual activity: Not Currently    Birth control/protection: Post-menopausal  Other Topics Concern   Not on file  Social History Narrative   Tobacco  use, amount per day now: None   Past tobacco use, amount per day: Up to 1 pack   How many years did you use tobacco: 20 years.   Alcohol use (drinks per week): None.   Diet: 3 meals daily/mostly cooked at home.   Do you drink/eat things with caffeine: periodic diet coke.   Marital status:  Divorced.                                What year were you married? 1968   Do you live in a house, apartment, assisted living, condo, trailer, etc.? One Level Townhome   Is it one or more stories? One Level   How many persons live in your home? One   Do you have pets in your home?( please list) No   Highest Level of education completed? High School   Current or past profession: Immunologist.    Do you exercise?   Yes when able.                               Type and how often? Silver Sneakers 2-3 times weekly.   Do you have a living will? Yes   Do you have a DNR form?        No                           If not, do you want to discuss one? Yes   Do you have signed POA/HPOA forms?   Yes                     If so, please bring to you appointment      Do you have any difficulty bathing or dressing yourself? Yes   Do you have any difficulty preparing food or eating? Yes   Do you have any difficulty managing your medications? Yes   Do you have any difficulty managing your finances? No   Do you have any difficulty affording your medications? No   Social Determinants of Health   Financial Resource Strain: Medium Risk (11/07/2022)   Overall Financial Resource Strain (  CARDIA)    Difficulty of Paying Living Expenses: Somewhat hard  Food Insecurity: No Food Insecurity (11/07/2022)   Hunger Vital Sign    Worried About Running Out of Food in the Last Year: Never true    Ran Out of Food in the Last Year: Never true  Transportation Needs: No Transportation Needs (11/07/2022)   PRAPARE - Administrator, Civil Service (Medical): No    Lack of Transportation (Non-Medical): No  Physical  Activity: Unknown (11/07/2022)   Exercise Vital Sign    Days of Exercise per Week: 0 days    Minutes of Exercise per Session: Not on file  Stress: Stress Concern Present (11/07/2022)   [employer] of Occupational Health - Occupational Stress Questionnaire    Feeling of Stress : Very much  Social Connections: Socially Isolated (11/07/2022)   Social Connection and Isolation Panel [NHANES]    Frequency of Communication with Friends and Family: Three times a week    Frequency of Social Gatherings with Friends and Family: Patient declined    Attends Religious Services: Never    Database administrator or Organizations: No    Attends Engineer, structural: Not on file    Marital Status: Divorced  Catering manager Violence: Not on file    Family History:  Family History  Problem Relation Age of Onset   Cancer Sister    Lung cancer Sister    Alcohol abuse Sister    Melanoma Brother    Brain cancer Brother    Heart failure Brother    Diabetes Brother    Cancer Brother    Heart Problems Brother    Down syndrome Brother    Heart failure Brother    Endometriosis Daughter    Hypertension Other     Medications:   Current Outpatient Medications on File Prior to Visit  Medication Sig Dispense Refill   acetaminophen (TYLENOL) 500 MG tablet Take 500-1,000 mg by mouth every 6 (six) hours as needed for moderate pain.     albuterol (VENTOLIN HFA) 108 (90 Base) MCG/ACT inhaler Inhale 2 puffs into the lungs every 4 (four) hours as needed for wheezing or shortness of breath (seasonal allergies).     dabigatran (PRADAXA) 150 MG CAPS capsule Take 1 capsule (150 mg total) by mouth every 12 (twelve) hours. 60 capsule 5   docusate sodium (COLACE) 100 MG capsule Take 100 mg by mouth 2 (two) times daily.     escitalopram (LEXAPRO) 10 MG tablet Take 1 tablet (10 mg total) by mouth daily. 30 tablet 3   famotidine (ACID REDUCER) 10 MG tablet TAKE (1) TABLET BY MOUTH TWICE DAILY FOR GERD. 90  tablet 1   fexofenadine (ALLEGRA) 60 MG tablet Take 1 tablet (60 mg total) by mouth daily. 90 tablet 0   furosemide (LASIX) 20 MG tablet TAKE (1) TABLET BY MOUTH ONCE DAILY IN THE MORNING. 30 tablet 3   Iron, Ferrous Sulfate, 325 (65 Fe) MG TABS Take 325 mg by mouth 3 (three) times a week. On Monday,Wednesday and Friday 30 tablet 3   lisinopril (ZESTRIL) 5 MG tablet Take 0.5 tablets (2.5 mg total) by mouth daily. 30 tablet 3   mirtazapine (REMERON) 30 MG tablet Take 30 mg by mouth at bedtime.     montelukast (SINGULAIR) 10 MG tablet Take 1 tablet (10 mg total) by mouth daily. 90 tablet 3   Multiple Vitamins-Minerals (EMERGEN-C VITAMIN C PO) Take 1 packet by mouth daily. Mix with water  oxymetazoline (AFRIN NASAL SPRAY) 0.05 % nasal spray Place 2 sprays into both nostrils 2 (two) times daily as needed. 2 sprays BID prn nose nose bleed 30 mL 0   rosuvastatin (CRESTOR) 20 MG tablet Take 1 tablet (20 mg total) by mouth daily. 90 tablet 3   Trolamine Salicylate (ASPERCREME EX) Apply 1 application topically daily as needed (knee pain).     No current facility-administered medications on file prior to visit.    Allergies:   Allergies  Allergen Reactions   Codeine Nausea And Vomiting   Cyclobenzaprine Other (See Comments)    Unknown reaction   Iodinated Contrast Media Nausea Only   Prednisone Other (See Comments)    "Sores in the mouth"      OBJECTIVE:  Physical Exam  Vitals:   11/08/22 1037  BP: (!) 147/74  Pulse: 75  Weight: 112 lb (50.8 kg)  Height: 5\' 1"  (1.549 m)   Body mass index is 21.16 kg/m. No results found.   General: Frail elderly Caucasian lady very pleasant elderly Caucasian female, seated, in no evident distress Head: head normocephalic and atraumatic.   Neck: supple with no carotid or supraclavicular bruits Cardiovascular: regular rate and rhythm, no murmurs Musculoskeletal: Right ankle boot.   Skin:  no rash/petichiae Vascular:  Normal pulses all  extremities   Neurologic Exam Mental Status: Awake and fully alert.  moderate to severe expressive aphasia, mild receptive aphasia.  Difficulty naming objects and reading words.  Able to speak in short sentences.  Difficulty fully assessing cognition.   Attention span, concentration and fund of knowledge appeared appropriate during visit. Mood and affect appropriate.  Cranial Nerves: Fundoscopic exam reveals sharp disc margins. Pupils equal, briskly reactive to light. Extraocular movements full without nystagmus. Visual fields full to confrontation. Hearing intact. Facial sensation intact. Face, tongue, palate moves normally and symmetrically.  Motor: Normal bulk and tone. Normal strength in all tested extremity muscles except slightly decreased right hand dexterity.  Unable to appreciate left hand deficits on exam Sensory.: very slight difference with light touch on right side compared to left side.  Does feel vibration and position in both feet.  Ankle jerks are depressed slightly on the left and right cannot be tested due to boot and ankle Coordination: Rapid alternating movements normal in all extremities except slightly decreased right hand. Finger-to-nose mild RUE ataxia and heel-to-shin performed accurately bilaterally.  Reflexes: 1+ and symmetric. Toes downgoing.  Gait uses a walker walks with a slightly broad-based gait favoring right foot due to pain    NIHSS  3 Modified Rankin  3      ASSESSMENT: Trent Theisen is a 88 y.o. year old female with left MCA infarct on 03/25/2022 with left M2 occlusion s/p IR with reocclusion of left M3, embolic secondary to A-lutter even on Eliquis therefore switched to Pradaxa. Vascular risk factors include A-fib, CAD, HTN, HLD, s/p TAVR 07/2021, Takotsubo cardiomyopathy, right carotid stenosis and advanced age.  Patient had recent episode of transient dizziness and imbalance likely multifactorial from combination of presyncopal events from orthostasis,  cervical spine stenosis and recent stroke.  Doubt peripheral neuropathy though lack of pain and sensation is concerning    PLAN:  I had a long discussion with the patient and her daughter regarding her recent recurrent falls are likely multifactorial due to combination of her old stroke, cervical spine stenosis orthostatic hypotension.  I advised her to get up slowly and avoid sudden head and neck movements and to use her walker at  all times.  Continue speech therapy and.  Physical and Occupational Therapy.  Continue Pradaxa for stroke prevention and maintain aggressive risk factor modification withBP goal<130/90, HLD with LDL goal<70 and DM with A1c.<6.5.  Check EMG nerve conduction study for neuropathy and EEG for seizures.  Return for follow-up in 4 months or call earlier if necessary I spent a prolonged 40 minutes of face-to-face and non-face-to-face time with patient and daughter.  This included previsit chart review including review of recent hospitalization, lab review, study review, electronic health record documentation, patient and daughter education and prolonged discussion regarding recent stroke including suspected etiology, secondary stroke prevention measures and importance of managing stroke risk factors, residual deficits and typical recovery time and answered all other multiple questions to patient and daughters satisfaction  Delia Heady, MD  [facility] [address] [place], [place] [address]  Phone [phone] Fax [phone] Note: This document was prepared with digital dictation and possible smart phrase technology. Any transcriptional errors that result from this process are unintentional.

## 2022-11-09 ENCOUNTER — Encounter: Payer: Self-pay | Admitting: Family Medicine

## 2022-11-09 ENCOUNTER — Ambulatory Visit (INDEPENDENT_AMBULATORY_CARE_PROVIDER_SITE_OTHER): Payer: Medicare Other | Admitting: Family Medicine

## 2022-11-09 VITALS — BP 130/78 | HR 90 | Temp 97.4°F | Ht 61.0 in | Wt 112.6 lb

## 2022-11-09 DIAGNOSIS — D509 Iron deficiency anemia, unspecified: Secondary | ICD-10-CM

## 2022-11-09 DIAGNOSIS — R42 Dizziness and giddiness: Secondary | ICD-10-CM | POA: Diagnosis not present

## 2022-11-09 DIAGNOSIS — R634 Abnormal weight loss: Secondary | ICD-10-CM

## 2022-11-09 DIAGNOSIS — W19XXXA Unspecified fall, initial encounter: Secondary | ICD-10-CM

## 2022-11-09 DIAGNOSIS — I6930 Unspecified sequelae of cerebral infarction: Secondary | ICD-10-CM

## 2022-11-09 LAB — CBC
HCT: 29 % — ABNORMAL LOW (ref 35.0–45.0)
MCV: 89 fL (ref 80.0–100.0)
Platelets: 264 10*3/uL (ref 140–400)

## 2022-11-09 NOTE — Progress Notes (Signed)
Provider:  Jacalyn Lefevre, MD  Careteam: Patient Care Team: Frederica Kuster, MD as PCP - General (Family Medicine) Swazil[place], Peter M, MD as PCP - Cardiology (Cardiology) Marisue Brooklyn, MD as Referring Physician  PLACE OF SERVICE:  [facility]  Advanced Directive information    Allergies  Allergen Reactions   Codeine Nausea And Vomiting   Cyclobenzaprine Other (See Comments)    Unknown reaction   Iodinated Contrast Media Nausea Only   Prednisone Other (See Comments)    "Sores in the mouth"    Chief Complaint  Patient presents with   Medical Management of Chronic Issues    Patient presents today for a 5 month follow-up   Quality Metric Gaps    DEXA scan, COVID#6     HPI: Patient is a 88 y.o. female this is a follow-up exam for this 88 year old female who who is a resident at [facility] assisted living.  She is accompanied by her daughter who works with the hospital system.  Patient suffered a stroke in September 2023.  She had had multiple episodes of instability dizziness and worsening ambulatory function ultimately culminating in falls. She went to rehab at skilled nursing at [facility].  At the time of discharge from [facility] after completing rehab there she was but not felt to be safe to go home and she was admitted to [facility] assisted living.  She continues to have falls and has recently had a fall where she suffered a fracture of the right ankle.  She is wearing a boot today.  She was seen recently by neurology who have ordered more tests on her carotid but who also have concerns about her gait instability and frequent falls. Patient is very adamant about going home.  In talking to her or recall seems fairly good but a slums test was administered today. She also has a history of aortic stenosis status post TAVR and Takotsubo cardiomyopathy  Review of Systems:  Review of Systems  Constitutional: Negative.   Respiratory: Negative.    Cardiovascular:  Negative.   Gastrointestinal: Negative.   Genitourinary: Negative.   Musculoskeletal:  Positive for falls.  Neurological:  Positive for dizziness.  Psychiatric/Behavioral:  Positive for memory loss.   All other systems reviewed and are negative.   Past Medical History:  Diagnosis Date   Allergies    Anxiety    panic attacks   Aortic stenosis    Arthritis    Bronchiectasis with (acute) exacerbation    CAD (coronary artery disease)    non obst CAD   Depression due to acute stroke    Dizziness    Elevated liver enzymes    Fall    Fluid overload    GERD (gastroesophageal reflux disease)    Heart murmur    Hemorrhoids    History of mammogram    Hyperlipemia    Hypertension    Internal carotid artery stenosis    OCD (obsessive compulsive disorder)    Orthostatic hypotension    Other cirrhosis of liver    PAF (paroxysmal atrial fibrillation)    Pericardial effusion    Physical deconditioning    Renal insufficiency    Rheumatic heart failure (congestive)    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 Lynnette Caffey & Dr. Laneta Simmers   Seasonal allergies    Severe aortic stenosis    Stroke    Takotsubo cardiomyopathy  Past Surgical History:  Procedure Laterality Date   COLONOSCOPY     EYE SURGERY Bilateral    cataracts removed   INTRAOPERATIVE TRANSTHORACIC ECHOCARDIOGRAM N/A 08/03/2021   Procedure: INTRAOPERATIVE TRANSTHORACIC ECHOCARDIOGRAM;  Surgeon: Orbie Pyo, MD;  [facility]: [facility] INVASIVE CV LAB;  Service: Open Heart Surgery;  Laterality: N/A;   IR CT HEAD LTD  03/25/2022   IR PERCUTANEOUS ART THROMBECTOMY/INFUSION INTRACRANIAL INC DIAG ANGIO  03/25/2022   IR US GUIDE VASC ACCESS RIGHT  03/25/2022   PERICARDIOCENTESIS N/A 07/28/2021   Procedure: PERICARDIOCENTESIS;  Surgeon: Orbie Pyo, MD;  Location: [facility] INVASIVE CV LAB;  Service: Cardiovascular;  Laterality: N/A;   RADIOLOGY WITH  ANESTHESIA N/A 03/25/2022   Procedure: IR WITH ANESTHESIA;  Surgeon: Radiologist, Medication, MD;  Location: [facility] OR;  Service: Radiology;  Laterality: N/A;   RIGHT/LEFT HEART CATH AND CORONARY ANGIOGRAPHY N/A 05/13/2019   Procedure: RIGHT/LEFT HEART CATH AND CORONARY ANGIOGRAPHY;  Surgeon: Swaziland, Peter M, MD;  Location: [facility] INVASIVE CV LAB;  Service: Cardiovascular;  Laterality: N/A;   RIGHT/LEFT HEART CATH AND CORONARY ANGIOGRAPHY N/A 07/23/2021   Procedure: RIGHT/LEFT HEART CATH AND CORONARY ANGIOGRAPHY;  Surgeon: Corky Crafts, MD;  Location: [facility] INVASIVE CV LAB;  Service: Cardiovascular;  Laterality: N/A;   TONSILLECTOMY     TRANSCATHETER AORTIC VALVE REPLACEMENT, TRANSFEMORAL N/A 08/03/2021   Procedure: TRANSCATHETER AORTIC VALVE REPLACEMENT, TRANSFEMORAL;  Surgeon: Orbie Pyo, MD;  [facility]: [facility] INVASIVE CV LAB;  Service: Open Heart Surgery;  Laterality: N/A;   UPPER GI ENDOSCOPY     Social History:   reports that she has quit smoking. Her smoking use included cigarettes. She has a 20.00 pack-year smoking history. She has never used smokeless tobacco. She reports current alcohol use. She reports that she does not use drugs.  Family History  Problem Relation Age of Onset   Cancer Sister    Lung cancer Sister    Alcohol abuse Sister    Melanoma Brother    Brain cancer Brother    Heart failure Brother    Diabetes Brother    Cancer Brother    Heart Problems Brother    Down syndrome Brother    Heart failure Brother    Endometriosis Daughter    Hypertension Other     Medications: Patient's Medications  New Prescriptions   No medications on file  Previous Medications   ACETAMINOPHEN (TYLENOL) 500 MG TABLET    Take 500-1,000 mg by mouth every 6 (six) hours as needed for moderate pain.   ALBUTEROL (VENTOLIN HFA) 108 (90 BASE) MCG/ACT INHALER    Inhale 2 puffs into the lungs every 4 (four) hours as needed for wheezing or shortness of breath (seasonal allergies).   DABIGATRAN  (PRADAXA) 150 MG CAPS CAPSULE    Take 1 capsule (150 mg total) by mouth every 12 (twelve) hours.   DOCUSATE SODIUM (COLACE) 100 MG CAPSULE    Take 100 mg by mouth 2 (two) times daily.   ESCITALOPRAM (LEXAPRO) 10 MG TABLET    Take 1 tablet (10 mg total) by mouth daily.   FAMOTIDINE (ACID REDUCER) 10 MG TABLET    TAKE (1) TABLET BY MOUTH TWICE DAILY FOR GERD.   FEXOFENADINE (ALLEGRA) 60 MG TABLET    Take 1 tablet (60 mg total) by mouth daily.   FUROSEMIDE (LASIX) 20 MG TABLET    TAKE (1) TABLET BY MOUTH ONCE DAILY IN THE MORNING.   IRON, FERROUS SULFATE, 325 (65 FE) MG TABS    Take 325  mg by mouth 3 (three) times a week. On Monday,Wednesday and Friday   LISINOPRIL (ZESTRIL) 5 MG TABLET    Take 0.5 tablets (2.5 mg total) by mouth daily.   MIRTAZAPINE (REMERON) 30 MG TABLET    Take 30 mg by mouth at bedtime.   MONTELUKAST (SINGULAIR) 10 MG TABLET    Take 1 tablet (10 mg total) by mouth daily.   MULTIPLE VITAMINS-MINERALS (EMERGEN-C VITAMIN C PO)    Take 1 packet by mouth daily. Mix with water   OXYMETAZOLINE (AFRIN NASAL SPRAY) 0.05 % NASAL SPRAY    Place 2 sprays into both nostrils 2 (two) times daily as needed. 2 sprays BID prn nose nose bleed   ROSUVASTATIN (CRESTOR) 20 MG TABLET    Take 1 tablet (20 mg total) by mouth daily.   TROLAMINE SALICYLATE (ASPERCREME EX)    Apply 1 application topically daily as needed (knee pain).  Modified Medications   No medications on file  Discontinued Medications   No medications on file    Physical Exam:  Vitals:   11/09/22 1517  BP: 130/78  Pulse: 90  Temp: (!) 97.4 F (36.3 C)  SpO2: 97%  Weight: 112 lb 9.6 oz (51.1 kg)  Height:  (1.549 m)   Body mass index is 21.28 kg/m. Wt Readings from Last 3 Encounters:  11/09/22 112 lb 9.6 oz (51.1 kg)  11/08/22 112 lb (50.8 kg)  09/26/22 109 lb (49.4 kg)    Physical Exam Vitals and nursing note reviewed.  Constitutional:      Appearance: Normal appearance.  HENT:     Head: Normocephalic.   Cardiovascular:     Rate and Rhythm: Normal rate and regular rhythm.     Heart sounds: Murmur heard.  Pulmonary:     Effort: Pulmonary effort is normal.     Breath sounds: Normal breath sounds.  Neurological:     General: No focal deficit present.     Mental Status: She is alert and oriented to person, place, and time.     Comments: Some mild difficulty word finding Results of slums test shows a score of 17/30 consistent with dementia.  Patient vigorously disputed this result.     Labs reviewed: Basic Metabolic Panel: Recent Labs    02/15/22 1119 03/25/22 1059 05/10/22 1532 05/11/22 0958 05/13/22 0358 05/19/22 1517 08/08/22 1413  NA 136   < > 133*   < > 134* 136 138  K 4.4   < > 4.1   < > 3.5 4.6 4.5  CL 99   < > 98   < > 100 102 104  CO2 31   < > 28   < > GLUCOSE 87   < > 92   < > 103* 87 87  BUN 20   < > 19   < > CREATININE 0.89   < > 0.96   < > 1.02* 0.95 1.24*  CALCIUM 9.5   < > 10.0   < > 9.1 9.3 9.4  MG  --   --   --   --  2.1  --   --   TSH 1.33  --  0.623  --   --   --   --    < > = values in this interval not displayed.   Liver Function Tests: Recent Labs    05/10/22 1532 05/11/22 0958 05/13/22 0358 05/19/22 1517 08/08/22 1413  AST 53* 50* 38 48*  45*  ALT 30 26 22 23 19   ALKPHOS 53 47 41  --   --   BILITOT 1.5* 1.7* 1.3* 1.2 1.0  PROT 7.7 6.1* 5.6* 6.4 6.3  ALBUMIN 4.3 3.3* 3.1*  --   --    No results for input(s): "LIPASE", "AMYLASE" in the last 8760 hours. Recent Labs    05/10/22 1532 05/19/22 1517  AMMONIA 19 <10   CBC: Recent Labs    05/10/22 1532 05/12/22 0311 05/19/22 1517 08/08/22 1413  WBC 8.0 7.4 6.1 6.3  NEUTROABS 5.7  --  3,129 3,522  HGB 10.8* 8.8* 9.6* 8.9*  HCT 34.2* 27.0* 30.2* 27.8*  MCV 91.7 90.6 92.1 88.5  PLT 280 216 269 273   Lipid Panel: Recent Labs    02/15/22 1119 03/26/22 0137  CHOL 204* 204*  HDL 85 86  LDLCALC 95 103*  TRIG 137 77  CHOLHDL 2.4 2.4   TSH: Recent Labs     02/15/22 1119 05/10/22 1532  TSH 1.33 0.623   A1C: Lab Results  Component Value Date   HGBA1C 5.4 03/26/2022     Assessment/Plan  1. Iron deficiency anemia, unspecified iron deficiency anemia type Patient has been on iron supplement 3 times a week.  Will reassess CBC today  2. Weight loss, abnormal Has actually gained 3 pounds since last visit.  Will consider this problem stable  3. Dizziness Intermittent dizziness which is a contributing factor to her frequent falls.  Followed by neurology.  Carotid study planned in the near future    5. History of CVA with residual deficit Suspect patient has vascular dementia.  Do not feel she is safe to return home but she is very unhappy at staying in assisted living.  Daughter has met with legal representation but I would support her not being able to go home given the result of a slums test   Jacalyn Lefevre, MD [facility] [phone]

## 2022-11-10 ENCOUNTER — Telehealth: Payer: Self-pay

## 2022-11-10 LAB — COMPREHENSIVE METABOLIC PANEL
AG Ratio: 1.3 (calc) (ref 1.0–2.5)
ALT: 22 U/L (ref 6–29)
AST: 53 U/L — ABNORMAL HIGH (ref 10–35)
Albumin: 3.8 g/dL (ref 3.6–5.1)
Alkaline phosphatase (APISO): 54 U/L (ref 37–153)
BUN/Creatinine Ratio: 20 (calc) (ref 6–22)
BUN: 25 mg/dL (ref 7–25)
CO2: 29 mmol/L (ref 20–32)
Calcium: 9.4 mg/dL (ref 8.6–10.4)
Chloride: 103 mmol/L (ref 98–110)
Creat: 1.23 mg/dL — ABNORMAL HIGH (ref 0.60–0.95)
Globulin: 2.9 g/dL (calc) (ref 1.9–3.7)
Glucose, Bld: 99 mg/dL (ref 65–99)
Potassium: 4 mmol/L (ref 3.5–5.3)
Sodium: 140 mmol/L (ref 135–146)
Total Bilirubin: 1.1 mg/dL (ref 0.2–1.2)
Total Protein: 6.7 g/dL (ref 6.1–8.1)

## 2022-11-10 LAB — CBC
Hemoglobin: 9.1 g/dL — ABNORMAL LOW (ref 11.7–15.5)
MCH: 27.9 pg (ref 27.0–33.0)
MCHC: 31.4 g/dL — ABNORMAL LOW (ref 32.0–36.0)
MPV: 11.1 fL (ref 7.5–12.5)
RBC: 3.26 10*6/uL — ABNORMAL LOW (ref 3.80–5.10)
RDW: 14.5 % (ref 11.0–15.0)
WBC: 8.8 10*3/uL (ref 3.8–10.8)

## 2022-11-10 NOTE — Telephone Encounter (Signed)
Angie, PT with [facility] called to request verbal orders for 1 time a week for 3 weeks, then 3 times a week for one week and 1 time a week for one week.  Verbal orders were given

## 2022-11-15 ENCOUNTER — Other Ambulatory Visit: Payer: Medicare Other | Admitting: *Deleted

## 2022-11-23 ENCOUNTER — Encounter: Payer: Self-pay | Admitting: Family Medicine

## 2022-11-23 ENCOUNTER — Ambulatory Visit (INDEPENDENT_AMBULATORY_CARE_PROVIDER_SITE_OTHER): Payer: Medicare Other

## 2022-11-23 ENCOUNTER — Ambulatory Visit (INDEPENDENT_AMBULATORY_CARE_PROVIDER_SITE_OTHER): Payer: Medicare Other | Admitting: Student

## 2022-11-23 DIAGNOSIS — S82831A Other fracture of upper and lower end of right fibula, initial encounter for closed fracture: Secondary | ICD-10-CM

## 2022-11-23 NOTE — Progress Notes (Signed)
Chief Complaint: Right ankle pain and swelling     History of Present Illness:   11/23/22: Patient presents today for 4-week follow-up.  Overall she reports that she has been doing extremely well.  She says that her ankle swelling has decreased significantly.  She has been wearing the boot throughout the day with some breaks while nonweightbearing and also wears it to sleep.  Her pain levels are minimal to without use of pain medication.    Barbara Mcconnell is a 88 y.o. female presenting for evaluation of pain and swelling in her right ankle and foot.  Her daughter is with her today who assisted in providing the history.  This began yesterday when the patient was standing up from a seated position when her foot twisted inward.  Since then she has been having some pain that she describes as a dull ache and has been unable to walk normally.  She is ambulating with a walker at today's visit.  She has noticed significant swelling and some bruising in her ankle that goes down into her foot as well.  She has tried icing it as well as Tylenol for pain control.  She is unable to take NSAIDs.  Patient is a resident of [facility].   Surgical History:   None  PMH/PSH/Family History/Social History/Meds/Allergies:    Past Medical History:  Diagnosis Date   Allergies    Anxiety    panic attacks   Aortic stenosis    Arthritis    Bronchiectasis with (acute) exacerbation (HCC)    CAD (coronary artery disease)    non obst CAD   Depression due to acute stroke (HCC)    Dizziness    Elevated liver enzymes    Fall    Fluid overload    GERD (gastroesophageal reflux disease)    Heart murmur    Hemorrhoids    History of mammogram    Hyperlipemia    Hypertension    Internal carotid artery stenosis    OCD (obsessive compulsive disorder)    Orthostatic hypotension    Other cirrhosis of liver (HCC)    PAF (paroxysmal atrial fibrillation) (HCC)    Pericardial effusion     Physical deconditioning    Renal insufficiency    Rheumatic heart failure (congestive) (HCC)    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 Lynnette Caffey & Dr. Laneta Simmers   Seasonal allergies    Severe aortic stenosis    Stroke [facility])    Takotsubo cardiomyopathy    Past Surgical History:  Procedure Laterality Date   COLONOSCOPY     EYE SURGERY Bilateral    cataracts removed   INTRAOPERATIVE TRANSTHORACIC ECHOCARDIOGRAM N/A 08/03/2021   Procedure: INTRAOPERATIVE TRANSTHORACIC ECHOCARDIOGRAM;  Surgeon: Orbie Pyo, MD;  [facility]: [facility] INVASIVE CV LAB;  Service: Open Heart Surgery;  Laterality: N/A;   IR CT HEAD LTD  03/25/2022   IR PERCUTANEOUS ART THROMBECTOMY/INFUSION INTRACRANIAL INC DIAG ANGIO  03/25/2022   IR US GUIDE VASC ACCESS RIGHT  03/25/2022   PERICARDIOCENTESIS N/A 07/28/2021   Procedure: PERICARDIOCENTESIS;  Surgeon: Orbie Pyo, MD;  Location: [facility] INVASIVE CV LAB;  Service: Cardiovascular;  Laterality: N/A;   RADIOLOGY WITH ANESTHESIA N/A 03/25/2022   Procedure: IR WITH ANESTHESIA;  Surgeon: Radiologist, Medication, MD;  Location: [facility] OR;  Service: Radiology;  Laterality: N/A;   RIGHT/LEFT HEART CATH AND CORONARY ANGIOGRAPHY N/A 05/13/2019   Procedure: RIGHT/LEFT HEART CATH AND CORONARY ANGIOGRAPHY;  Surgeon: Swaziland, Peter M, MD;  Location: [facility] INVASIVE CV LAB;  Service: Cardiovascular;  Laterality: N/A;   RIGHT/LEFT HEART CATH AND CORONARY ANGIOGRAPHY N/A 07/23/2021   Procedure: RIGHT/LEFT HEART CATH AND CORONARY ANGIOGRAPHY;  Surgeon: Corky Crafts, MD;  Location: [facility] INVASIVE CV LAB;  Service: Cardiovascular;  Laterality: N/A;   TONSILLECTOMY     TRANSCATHETER AORTIC VALVE REPLACEMENT, TRANSFEMORAL N/A 08/03/2021   Procedure: TRANSCATHETER AORTIC VALVE REPLACEMENT, TRANSFEMORAL;  Surgeon: Orbie Pyo, MD;  Location: [facility] INVASIVE CV LAB;  Service: Open Heart Surgery;  Laterality: N/A;   UPPER  GI ENDOSCOPY     Social History   Socioeconomic History   Marital status: Divorced    Spouse name: Not on file   Number of children: Not on file   Years of education: Not on file   Highest education level: 12th grade  Occupational History   Not on file  Tobacco Use   Smoking status: Former    Packs/day: 1.00    Years: 20.00    Additional pack years: 0.00    Total pack years: 20.00    Types: Cigarettes   Smokeless tobacco: Never   Tobacco comments:    Quit 30 yrs ago as of 07/2021 per daughter  Vaping Use   Vaping Use: Never used  Substance and Sexual Activity   Alcohol use: Yes    Comment: occasiona wine   Drug use: Never   Sexual activity: Not Currently    Birth control/protection: Post-menopausal  Other Topics Concern   Not on file  Social History Narrative   Tobacco use, amount per day now: None   Past tobacco use, amount per day: Up to 1 pack   How many years did you use tobacco: 20 years.   Alcohol use (drinks per week): None.   Diet: 3 meals daily/mostly cooked at home.   Do you drink/eat things with caffeine: periodic diet coke.   Marital status:  Divorced.                                What year were you married? 1968   Do you live in a house, apartment, assisted living, condo, trailer, etc.? One Level Townhome   Is it one or more stories? One Level   How many persons live in your home? One   Do you have pets in your home?( please list) No   Highest Level of education completed? High School   Current or past profession: Immunologist.    Do you exercise?   Yes when able.                               Type and how often? Silver Sneakers 2-3 times weekly.   Do you have a living will? Yes   Do you have a DNR form?        No                           If not, do you want to discuss one? Yes   Do you have signed POA/HPOA forms?   Yes  If so, please bring to you appointment      Do you have any difficulty bathing or dressing  yourself? Yes   Do you have any difficulty preparing food or eating? Yes   Do you have any difficulty managing your medications? Yes   Do you have any difficulty managing your finances? No   Do you have any difficulty affording your medications? No   Social Determinants of Health   Financial Resource Strain: Medium Risk (11/07/2022)   Overall Financial Resource Strain (CARDIA)    Difficulty of Paying Living Expenses: Somewhat hard  Food Insecurity: No Food Insecurity (11/07/2022)   Hunger Vital Sign    Worried About Running Out of Food in the Last Year: Never true    Ran Out of Food in the Last Year: Never true  Transportation Needs: No Transportation Needs (11/07/2022)   PRAPARE - Administrator, Civil Service (Medical): No    Lack of Transportation (Non-Medical): No  Physical Activity: Unknown (11/07/2022)   Exercise Vital Sign    Days of Exercise per Week: 0 days    Minutes of Exercise per Session: Not on file  Stress: Stress Concern Present (11/07/2022)   [employer] of Occupational Health - Occupational Stress Questionnaire    Feeling of Stress : Very much  Social Connections: Socially Isolated (11/07/2022)   Social Connection and Isolation Panel [NHANES]    Frequency of Communication with Friends and Family: Three times a week    Frequency of Social Gatherings with Friends and Family: Patient declined    Attends Religious Services: Never    Database administrator or Organizations: No    Attends Engineer, structural: Not on file    Marital Status: Divorced   Family History  Problem Relation Age of Onset   Cancer Sister    Lung cancer Sister    Alcohol abuse Sister    Melanoma Brother    Brain cancer Brother    Heart failure Brother    Diabetes Brother    Cancer Brother    Heart Problems Brother    Down syndrome Brother    Heart failure Brother    Endometriosis Daughter    Hypertension Other    Allergies  Allergen Reactions   Codeine Nausea  And Vomiting   Cyclobenzaprine Other (See Comments)    Unknown reaction   Iodinated Contrast Media Nausea Only   Prednisone Other (See Comments)    "Sores in the mouth"   Current Outpatient Medications  Medication Sig Dispense Refill   acetaminophen (TYLENOL) 500 MG tablet Take 500-1,000 mg by mouth every 6 (six) hours as needed for moderate pain.     albuterol (VENTOLIN HFA) 108 (90 Base) MCG/ACT inhaler Inhale 2 puffs into the lungs every 4 (four) hours as needed for wheezing or shortness of breath (seasonal allergies).     dabigatran (PRADAXA) 150 MG CAPS capsule Take 1 capsule (150 mg total) by mouth every 12 (twelve) hours. 60 capsule 5   docusate sodium (COLACE) 100 MG capsule Take 100 mg by mouth 2 (two) times daily.     escitalopram (LEXAPRO) 10 MG tablet Take 1 tablet (10 mg total) by mouth daily. 30 tablet 3   famotidine (ACID REDUCER) 10 MG tablet TAKE (1) TABLET BY MOUTH TWICE DAILY FOR GERD. 90 tablet 1   fexofenadine (ALLEGRA) 60 MG tablet Take 1 tablet (60 mg total) by mouth daily. 90 tablet 0   furosemide (LASIX) 20 MG tablet TAKE (1) TABLET  BY MOUTH ONCE DAILY IN THE MORNING. 30 tablet 3   Iron, Ferrous Sulfate, 325 (65 Fe) MG TABS Take 325 mg by mouth 3 (three) times a week. On Monday,Wednesday and Friday 30 tablet 3   lisinopril (ZESTRIL) 5 MG tablet Take 0.5 tablets (2.5 mg total) by mouth daily. 30 tablet 3   mirtazapine (REMERON) 30 MG tablet Take 30 mg by mouth at bedtime.     montelukast (SINGULAIR) 10 MG tablet Take 1 tablet (10 mg total) by mouth daily. 90 tablet 3   Multiple Vitamins-Minerals (EMERGEN-C VITAMIN C PO) Take 1 packet by mouth daily. Mix with water     oxymetazoline (AFRIN NASAL SPRAY) 0.05 % nasal spray Place 2 sprays into both nostrils 2 (two) times daily as needed. 2 sprays BID prn nose nose bleed 30 mL 0   rosuvastatin (CRESTOR) 20 MG tablet Take 1 tablet (20 mg total) by mouth daily. 90 tablet 3   Trolamine Salicylate (ASPERCREME EX) Apply 1  application topically daily as needed (knee pain).     No current facility-administered medications for this visit.   No results found.  Review of Systems:   A ROS was performed including pertinent positives and negatives as documented in the HPI.  Physical Exam :   Constitutional: NAD and appears stated age Neurological: Alert and oriented Psych: Appropriate affect and cooperative There were no vitals taken for this visit.   Comprehensive Musculoskeletal Exam:    Patient is ambulating with a walker and Aircast boot. There is mild soft tissue swelling over the lateral ankle without ecchymosis.  No tenderness over ankle or foot with palpation.  Able to actively plantarflex to 40 degrees and dorsiflexion to 20 degrees without pain.  Distal neurosensory exam intact.   Imaging:   Xray (right ankle 3 views, right foot 4 views): Nondisplaced distal fibula fracture with evidence of callous formation  I personally reviewed and interpreted the radiographs.   Assessment:   88 y.o. female presenting for follow-up of nondisplaced distal fibula fracture.  Overall she is progressing very well.  I would like her to begin progressing her weightbearing as tolerated out of the boot and into a normal shoe.  She has worked previously with a home health PT which I believe would be beneficial to help her progress with weightbearing and stability.  Verbal orders were given today.  We will plan on seeing her back in clinic in 8 weeks for final x-ray and reevaluation.   Plan :    -Return to clinic in 8 weeks for reevaluation    I personally saw and evaluated the patient, and participated in the management and treatment plan.  Hazle Nordmann, PA-C Orthopedics  This document was dictated using Conservation officer, historic buildings. A reasonable attempt at proof reading has been made to minimize errors.

## 2022-12-14 ENCOUNTER — Encounter: Payer: Self-pay | Admitting: Family Medicine

## 2022-12-15 ENCOUNTER — Telehealth: Payer: Medicare Other

## 2022-12-15 NOTE — Telephone Encounter (Signed)
Barbara Mcconnell, Interior and spatial designer of [employer] called to inform Frederica Kuster, MD that patients daughter Barbara Mcconnell, asked that they cancel orders for speech therapt (in which they granted request), however patient will continue with physical therapy.

## 2022-12-19 NOTE — Telephone Encounter (Signed)
Okay to cancel orders for speech therapy

## 2022-12-20 ENCOUNTER — Telehealth: Payer: Self-pay | Admitting: Family Medicine

## 2022-12-20 NOTE — Telephone Encounter (Signed)
Patient's daughter, [name], called and stated her mom's PT at [facility] has ended today. She said Darla Lesches, DPT from [facility] will be calling tomorrow, 5/29, to obtain verbal orders to switch to their facility for speech/language and PT. Dr. Hyacinth Meeker would need to approve the order for speech and PT for [facility].

## 2022-12-21 NOTE — Telephone Encounter (Signed)
Okay to approve order for ST and PT for [facility]

## 2022-12-23 NOTE — Telephone Encounter (Signed)
Message routed to PCP Miller, Stephen M, MD  ?

## 2023-01-18 ENCOUNTER — Ambulatory Visit (INDEPENDENT_AMBULATORY_CARE_PROVIDER_SITE_OTHER): Payer: Medicare Other | Admitting: Student

## 2023-01-18 ENCOUNTER — Encounter (HOSPITAL_BASED_OUTPATIENT_CLINIC_OR_DEPARTMENT_OTHER): Payer: Self-pay | Admitting: Student

## 2023-01-18 ENCOUNTER — Ambulatory Visit (INDEPENDENT_AMBULATORY_CARE_PROVIDER_SITE_OTHER): Payer: Medicare Other

## 2023-01-18 DIAGNOSIS — S82831D Other fracture of upper and lower end of right fibula, subsequent encounter for closed fracture with routine healing: Secondary | ICD-10-CM | POA: Diagnosis not present

## 2023-01-18 DIAGNOSIS — S82831A Other fracture of upper and lower end of right fibula, initial encounter for closed fracture: Secondary | ICD-10-CM

## 2023-01-18 NOTE — Progress Notes (Signed)
Chief Complaint: Right ankle pain and swelling     History of Present Illness:   Barbara Mcconnell is a 88 y.o.presenting for follow-up today 12 weeks status post distal fibula fracture.  She has been working with physical therapy in her assisted living community 2-3 times a week.  Her strength has significantly improved and she is walking without use of any assistive device.  She has been experiencing slight increase of general swelling and discomfort around the ankle.  Also has some slight tingling throughout the lower leg.  Regular edema of both legs and is on Lasix.   Surgical History:   None  PMH/PSH/Family History/Social History/Meds/Allergies:    Past Medical History:  Diagnosis Date   Allergies    Anxiety    panic attacks   Aortic stenosis    Arthritis    Bronchiectasis with (acute) exacerbation (HCC)    CAD (coronary artery disease)    non obst CAD   Depression due to acute stroke (HCC)    Dizziness    Elevated liver enzymes    Fall    Fluid overload    GERD (gastroesophageal reflux disease)    Heart murmur    Hemorrhoids    History of mammogram    Hyperlipemia    Hypertension    Internal carotid artery stenosis    OCD (obsessive compulsive disorder)    Orthostatic hypotension    Other cirrhosis of liver (HCC)    PAF (paroxysmal atrial fibrillation) (HCC)    Pericardial effusion    Physical deconditioning    Renal insufficiency    Rheumatic heart failure (congestive) (HCC)    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 Lynnette Caffey & Dr. Laneta Simmers   Seasonal allergies    Severe aortic stenosis    Stroke [facility])    Takotsubo cardiomyopathy    Past Surgical History:  Procedure Laterality Date   COLONOSCOPY     EYE SURGERY Bilateral    cataracts removed   INTRAOPERATIVE TRANSTHORACIC ECHOCARDIOGRAM N/A 08/03/2021   Procedure: INTRAOPERATIVE TRANSTHORACIC  ECHOCARDIOGRAM;  Surgeon: Orbie Pyo, MD;  Location: [facility] INVASIVE CV LAB;  Service: Open Heart Surgery;  Laterality: N/A;   IR CT HEAD LTD  03/25/2022   IR PERCUTANEOUS ART THROMBECTOMY/INFUSION INTRACRANIAL INC DIAG ANGIO  03/25/2022   IR US GUIDE VASC ACCESS RIGHT  03/25/2022   PERICARDIOCENTESIS N/A 07/28/2021   Procedure: PERICARDIOCENTESIS;  Surgeon: Orbie Pyo, MD;  Location: [facility] INVASIVE CV LAB;  Service: Cardiovascular;  Laterality: N/A;   RADIOLOGY WITH ANESTHESIA N/A 03/25/2022   Procedure: IR WITH ANESTHESIA;  Surgeon: Radiologist, Medication, MD;  Location: [facility] OR;  Service: Radiology;  Laterality: N/A;   RIGHT/LEFT HEART CATH AND CORONARY ANGIOGRAPHY N/A 05/13/2019   Procedure: RIGHT/LEFT HEART CATH AND CORONARY ANGIOGRAPHY;  Surgeon: Swaziland, Peter M, MD;  Location: [facility] INVASIVE CV LAB;  Service: Cardiovascular;  Laterality: N/A;   RIGHT/LEFT HEART CATH AND CORONARY ANGIOGRAPHY N/A 07/23/2021   Procedure: RIGHT/LEFT HEART CATH AND CORONARY ANGIOGRAPHY;  Surgeon: Corky Crafts, MD;  Location: [facility] INVASIVE CV LAB;  Service: Cardiovascular;  Laterality: N/A;   TONSILLECTOMY     TRANSCATHETER AORTIC VALVE REPLACEMENT, TRANSFEMORAL N/A 08/03/2021   Procedure: TRANSCATHETER AORTIC VALVE REPLACEMENT, TRANSFEMORAL;  Surgeon: Lynnette Caffey,  Charlies Constable, MD;  Location: [facility] INVASIVE CV LAB;  Service: Open Heart Surgery;  Laterality: N/A;   UPPER GI ENDOSCOPY     Social History   Socioeconomic History   Marital status: Divorced    Spouse name: Not on file   Number of children: Not on file   Years of education: Not on file   Highest education level: 12th grade  Occupational History   Not on file  Tobacco Use   Smoking status: Former    Packs/day: 1.00    Years: 20.00    Additional pack years: 0.00    Total pack years: 20.00    Types: Cigarettes   Smokeless tobacco: Never   Tobacco comments:    Quit 30 yrs ago as of 07/2021 per daughter  Vaping Use   Vaping Use: Never used  Substance  and Sexual Activity   Alcohol use: Yes    Comment: occasiona wine   Drug use: Never   Sexual activity: Not Currently    Birth control/protection: Post-menopausal  Other Topics Concern   Not on file  Social History Narrative   Tobacco use, amount per day now: None   Past tobacco use, amount per day: Up to 1 pack   How many years did you use tobacco: 20 years.   Alcohol use (drinks per week): None.   Diet: 3 meals daily/mostly cooked at home.   Do you drink/eat things with caffeine: periodic diet coke.   Marital status:  Divorced.                                What year were you married? 1968   Do you live in a house, apartment, assisted living, condo, trailer, etc.? One Level Townhome   Is it one or more stories? One Level   How many persons live in your home? One   Do you have pets in your home?( please list) No   Highest Level of education completed? High School   Current or past profession: Immunologist.    Do you exercise?   Yes when able.                               Type and how often? Silver Sneakers 2-3 times weekly.   Do you have a living will? Yes   Do you have a DNR form?        No                           If not, do you want to discuss one? Yes   Do you have signed POA/HPOA forms?   Yes                     If so, please bring to you appointment      Do you have any difficulty bathing or dressing yourself? Yes   Do you have any difficulty preparing food or eating? Yes   Do you have any difficulty managing your medications? Yes   Do you have any difficulty managing your finances? No   Do you have any difficulty affording your medications? No   Social Determinants of Health   Financial Resource Strain: Medium Risk (11/07/2022)   Overall Financial Resource Strain (CARDIA)    Difficulty of Paying Living Expenses: Somewhat hard  Food  Insecurity: No Food Insecurity (11/07/2022)   Hunger Vital Sign    Worried About Running Out of Food in the Last Year:  Never true    Ran Out of Food in the Last Year: Never true  Transportation Needs: No Transportation Needs (11/07/2022)   PRAPARE - Administrator, Civil Service (Medical): No    Lack of Transportation (Non-Medical): No  Physical Activity: Unknown (11/07/2022)   Exercise Vital Sign    Days of Exercise per Week: 0 days    Minutes of Exercise per Session: Not on file  Stress: Stress Concern Present (11/07/2022)   [employer] of Occupational Health - Occupational Stress Questionnaire    Feeling of Stress : Very much  Social Connections: Socially Isolated (11/07/2022)   Social Connection and Isolation Panel [NHANES]    Frequency of Communication with Friends and Family: Three times a week    Frequency of Social Gatherings with Friends and Family: Patient declined    Attends Religious Services: Never    Database administrator or Organizations: No    Attends Engineer, structural: Not on file    Marital Status: Divorced   Family History  Problem Relation Age of Onset   Cancer Sister    Lung cancer Sister    Alcohol abuse Sister    Melanoma Brother    Brain cancer Brother    Heart failure Brother    Diabetes Brother    Cancer Brother    Heart Problems Brother    Down syndrome Brother    Heart failure Brother    Endometriosis Daughter    Hypertension Other    Allergies  Allergen Reactions   Codeine Nausea And Vomiting   Cyclobenzaprine Other (See Comments)    Unknown reaction   Iodinated Contrast Media Nausea Only   Prednisone Other (See Comments)    "Sores in the mouth"   Current Outpatient Medications  Medication Sig Dispense Refill   acetaminophen (TYLENOL) 500 MG tablet Take 500-1,000 mg by mouth every 6 (six) hours as needed for moderate pain.     albuterol (VENTOLIN HFA) 108 (90 Base) MCG/ACT inhaler Inhale 2 puffs into the lungs every 4 (four) hours as needed for wheezing or shortness of breath (seasonal allergies).     dabigatran (PRADAXA) 150 MG  CAPS capsule Take 1 capsule (150 mg total) by mouth every 12 (twelve) hours. 60 capsule 5   docusate sodium (COLACE) 100 MG capsule Take 100 mg by mouth 2 (two) times daily.     escitalopram (LEXAPRO) 10 MG tablet Take 1 tablet (10 mg total) by mouth daily. 30 tablet 3   famotidine (ACID REDUCER) 10 MG tablet TAKE (1) TABLET BY MOUTH TWICE DAILY FOR GERD. 90 tablet 1   fexofenadine (ALLEGRA) 60 MG tablet Take 1 tablet (60 mg total) by mouth daily. 90 tablet 0   furosemide (LASIX) 20 MG tablet TAKE (1) TABLET BY MOUTH ONCE DAILY IN THE MORNING. 30 tablet 3   Iron, Ferrous Sulfate, 325 (65 Fe) MG TABS Take 325 mg by mouth 3 (three) times a week. On Monday,Wednesday and Friday 30 tablet 3   lisinopril (ZESTRIL) 5 MG tablet Take 0.5 tablets (2.5 mg total) by mouth daily. 30 tablet 3   mirtazapine (REMERON) 30 MG tablet Take 30 mg by mouth at bedtime.     montelukast (SINGULAIR) 10 MG tablet Take 1 tablet (10 mg total) by mouth daily. 90 tablet 3   Multiple Vitamins-Minerals (EMERGEN-C VITAMIN C PO) Take  1 packet by mouth daily. Mix with water     oxymetazoline (AFRIN NASAL SPRAY) 0.05 % nasal spray Place 2 sprays into both nostrils 2 (two) times daily as needed. 2 sprays BID prn nose nose bleed 30 mL 0   rosuvastatin (CRESTOR) 20 MG tablet Take 1 tablet (20 mg total) by mouth daily. 90 tablet 3   Trolamine Salicylate (ASPERCREME EX) Apply 1 application topically daily as needed (knee pain).     No current facility-administered medications for this visit.   No results found.  Review of Systems:   A ROS was performed including pertinent positives and negatives as documented in the HPI.  Physical Exam :   Constitutional: NAD and appears stated age Neurological: Alert and oriented Psych: Appropriate affect and cooperative There were no vitals taken for this visit.   Comprehensive Musculoskeletal Exam:    Patient ambulating well without use of assistive device.  No tenderness palpation over  lateral malleolus and throughout the rest of the ankle and foot.  Active ankle range of motion to 20 degrees dorsiflexion 40 degrees plantarflexion.  Mild edema in lower legs bilaterally.  Able to extend knee good quad strength and tone.  DP pulse 2+.   Imaging:   Xray (right ankle 3 views, right foot 4 views): Evidence of healing distal fibula fracture with slight increase in callus from previous x-ray on 11/23/2022   I personally reviewed and interpreted the radiographs.   Assessment:   88 y.o. female 4 weeks status post nondisplaced distal fibula fracture.  Overall she is doing well and has been significantly improving with strengthening working with physical therapy.  Was previously utilizing walker however is now ambulating well without assistance.  Has had an increase of swelling around the ankle as well as discomfort which I do believe is a result of increasing activity with PT.  She is going to try compression socks to help with lower leg edema.  Discussed that at this point she can follow back up on an as-needed basis as she has progressed very well in her recovery.  Plan :    -Return to clinic as needed    I personally saw and evaluated the patient, and participated in the management and treatment plan.  Hazle Nordmann, PA-C Orthopedics  This document was dictated using Conservation officer, historic buildings. A reasonable attempt at proof reading has been made to minimize errors.

## 2023-01-31 ENCOUNTER — Other Ambulatory Visit: Payer: Self-pay | Admitting: Family Medicine

## 2023-01-31 DIAGNOSIS — F339 Major depressive disorder, recurrent, unspecified: Secondary | ICD-10-CM

## 2023-01-31 DIAGNOSIS — R0982 Postnasal drip: Secondary | ICD-10-CM

## 2023-01-31 DIAGNOSIS — I10 Essential (primary) hypertension: Secondary | ICD-10-CM

## 2023-01-31 NOTE — Telephone Encounter (Signed)
High Risk Warning Populated when attempting to refill, I will send to Provider for further review 

## 2023-02-02 ENCOUNTER — Encounter: Payer: Medicare Other | Admitting: Neurology

## 2023-02-15 ENCOUNTER — Encounter: Payer: Self-pay | Admitting: Family Medicine

## 2023-02-15 ENCOUNTER — Ambulatory Visit (INDEPENDENT_AMBULATORY_CARE_PROVIDER_SITE_OTHER): Payer: Medicare Other | Admitting: Family Medicine

## 2023-02-15 VITALS — BP 150/60 | HR 109 | Temp 97.9°F | Resp 16 | Ht 61.0 in | Wt 113.0 lb

## 2023-02-15 DIAGNOSIS — Z952 Presence of prosthetic heart valve: Secondary | ICD-10-CM | POA: Diagnosis not present

## 2023-02-15 DIAGNOSIS — I10 Essential (primary) hypertension: Secondary | ICD-10-CM | POA: Diagnosis not present

## 2023-02-15 DIAGNOSIS — R4189 Other symptoms and signs involving cognitive functions and awareness: Secondary | ICD-10-CM

## 2023-02-15 DIAGNOSIS — R4689 Other symptoms and signs involving appearance and behavior: Secondary | ICD-10-CM | POA: Insufficient documentation

## 2023-02-15 DIAGNOSIS — E785 Hyperlipidemia, unspecified: Secondary | ICD-10-CM

## 2023-02-15 DIAGNOSIS — D509 Iron deficiency anemia, unspecified: Secondary | ICD-10-CM

## 2023-02-15 LAB — CBC
MCH: 28.9 pg (ref 27.0–33.0)
MCHC: 31.9 g/dL — ABNORMAL LOW (ref 32.0–36.0)
MPV: 12 fL (ref 7.5–12.5)
Platelets: 278 10*3/uL (ref 140–400)
RBC: 3.49 10*6/uL — ABNORMAL LOW (ref 3.80–5.10)
RDW: 13.1 % (ref 11.0–15.0)

## 2023-02-15 NOTE — Progress Notes (Signed)
Provider:  Jacalyn Lefevre, MD  Careteam: Patient Care Team: Frederica Kuster, MD as PCP - General (Family Medicine) Swazil[place], Peter M, MD as PCP - Cardiology (Cardiology) Marisue Brooklyn, MD as Referring Physician  PLACE OF SERVICE:  [facility] CLINIC  Advanced Directive information Does Patient Have a Medical Advance Directive?: Yes, Type of Advance Directive: Healthcare Power of [place];Living will;Out of facility DNR (pink MOST or yellow form), Does patient want to make changes to medical advance directive?: No - Patient declined  Allergies  Allergen Reactions   Codeine Nausea And Vomiting   Cyclobenzaprine Other (See Comments)    Unknown reaction   Iodinated Contrast Media Nausea Only   Prednisone Other (See Comments)    "Sores in the mouth"    Chief Complaint  Patient presents with   Medical Management of Chronic Issues    3 month follow up.    Health Maintenance    Discuss the need for Dexa scan.    Immunizations    Discuss the need for [employer].      HPI: Patient is a 88 y.o. female patient is here for 21-month follow-up.  She is still living at [facility].  Things seem a little more resigned or settled today.  At her last visit she was very upset that she was living there and also upset that I explained that she may have some dementia.  She was adamant about leaving the facility and going home and was safe to do so.  She is accompanied at today's visit and each visit by her daughter [name]. We reviewed her medicines today.  She complains of feeling drowsy in the mornings.  This could be a side effect of Remeron which she was given for sleep and appetite but now has been increased to the antidepressant dose while also on Lexapro. Other concerns are Colace twice a day.  She request that be reduced to once a day.  She is also requesting reduction in iron which she takes 3 times a week.  She has been anemic with hemoglobin 8.6 and 9.1 on previous 2 CBC checks. She  is still receiving PT after her fractured ankle and doing well  Review of Systems:  Review of Systems  Constitutional: Negative.   Respiratory: Negative.    Cardiovascular: Negative.   Genitourinary: Negative.   Musculoskeletal:  Positive for joint pain.  Neurological: Negative.   Psychiatric/Behavioral:  Positive for memory loss.   All other systems reviewed and are negative.   Past Medical History:  Diagnosis Date   Allergies    Anxiety    panic attacks   Aortic stenosis    Arthritis    Bronchiectasis with (acute) exacerbation (HCC)    CAD (coronary artery disease)    non obst CAD   Depression due to acute stroke (HCC)    Dizziness    Elevated liver enzymes    Fall    Fluid overload    GERD (gastroesophageal reflux disease)    Heart murmur    Hemorrhoids    History of mammogram    Hyperlipemia    Hypertension    Internal carotid artery stenosis    OCD (obsessive compulsive disorder)    Orthostatic hypotension    Other cirrhosis of liver (HCC)    PAF (paroxysmal atrial fibrillation) (HCC)    Pericardial effusion    Physical deconditioning    Renal insufficiency    Rheumatic heart failure (congestive) (HCC)    S/P TAVR (transcatheter aortic  valve replacement) 08/03/2021   s/p TAVR with a 23 mm Edwards S3UR via the TF approach by Dr. Lynnette Caffey & Dr. Laneta Simmers   Seasonal allergies    Severe aortic stenosis    Stroke [facility])    Takotsubo cardiomyopathy    Past Surgical History:  Procedure Laterality Date   COLONOSCOPY     EYE SURGERY Bilateral    cataracts removed   INTRAOPERATIVE TRANSTHORACIC ECHOCARDIOGRAM N/A 08/03/2021   Procedure: INTRAOPERATIVE TRANSTHORACIC ECHOCARDIOGRAM;  Surgeon: Orbie Pyo, MD;  Location: [facility] INVASIVE CV LAB;  Service: Open Heart Surgery;  Laterality: N/A;   IR CT HEAD LTD  03/25/2022   IR PERCUTANEOUS ART THROMBECTOMY/INFUSION INTRACRANIAL INC DIAG ANGIO  03/25/2022   IR US GUIDE VASC ACCESS RIGHT  03/25/2022   PERICARDIOCENTESIS N/A  07/28/2021   Procedure: PERICARDIOCENTESIS;  Surgeon: Orbie Pyo, MD;  Location: [facility] INVASIVE CV LAB;  Service: Cardiovascular;  Laterality: N/A;   RADIOLOGY WITH ANESTHESIA N/A 03/25/2022   Procedure: IR WITH ANESTHESIA;  Surgeon: Radiologist, Medication, MD;  Location: [facility] OR;  Service: Radiology;  Laterality: N/A;   RIGHT/LEFT HEART CATH AND CORONARY ANGIOGRAPHY N/A 05/13/2019   Procedure: RIGHT/LEFT HEART CATH AND CORONARY ANGIOGRAPHY;  Surgeon: Swaziland, Peter M, MD;  Location: [facility] INVASIVE CV LAB;  Service: Cardiovascular;  Laterality: N/A;   RIGHT/LEFT HEART CATH AND CORONARY ANGIOGRAPHY N/A 07/23/2021   Procedure: RIGHT/LEFT HEART CATH AND CORONARY ANGIOGRAPHY;  Surgeon: Corky Crafts, MD;  Location: [facility] INVASIVE CV LAB;  Service: Cardiovascular;  Laterality: N/A;   TONSILLECTOMY     TRANSCATHETER AORTIC VALVE REPLACEMENT, TRANSFEMORAL N/A 08/03/2021   Procedure: TRANSCATHETER AORTIC VALVE REPLACEMENT, TRANSFEMORAL;  Surgeon: Orbie Pyo, MD;  Location: [facility] INVASIVE CV LAB;  Service: Open Heart Surgery;  Laterality: N/A;   UPPER GI ENDOSCOPY     Social History:   reports that she has quit smoking. Her smoking use included cigarettes. She has a 20 pack-year smoking history. She has never used smokeless tobacco. She reports current alcohol use. She reports that she does not use drugs.  Family History  Problem Relation Age of Onset   Cancer Sister    Lung cancer Sister    Alcohol abuse Sister    Melanoma Brother    Brain cancer Brother    Heart failure Brother    Diabetes Brother    Cancer Brother    Heart Problems Brother    Down syndrome Brother    Heart failure Brother    Endometriosis Daughter    Hypertension Other     Medications: Patient's Medications  New Prescriptions   No medications on file  Previous Medications   ACETAMINOPHEN (TYLENOL) 500 MG TABLET    Take 500-1,000 mg by mouth every 6 (six) hours as needed for moderate pain.   ACID REDUCER 10 MG  TABLET    TAKE (1) TABLET BY MOUTH TWICE DAILY FOR GERD.   ALBUTEROL (VENTOLIN HFA) 108 (90 BASE) MCG/ACT INHALER    Inhale 2 puffs into the lungs every 4 (four) hours as needed for wheezing or shortness of breath (seasonal allergies).   DABIGATRAN (PRADAXA) 150 MG CAPS CAPSULE    TAKE (1) CAPSULE BY MOUTH TWICE DAILY.   DOCUSATE SODIUM (COLACE) 100 MG CAPSULE    TAKE (1) CAPSULE BY MOUTH TWICE DAILY FOR CONSTIPATION.   ESCITALOPRAM (LEXAPRO) 10 MG TABLET    TAKE (1) TABLET BY MOUTH ONCE DAILY.   FEXOFENADINE (ALLEGRA) 60 MG TABLET    TAKE (1) TABLET BY MOUTH ONCE  EVERY MORNING.   FUROSEMIDE (LASIX) 20 MG TABLET    TAKE (1) TABLET BY MOUTH ONCE DAILY IN THE MORNING.   IRON, FERROUS SULFATE, 325 (65 FE) MG TABS    Take 325 mg by mouth 3 (three) times a week. On Monday,Wednesday and Friday   LISINOPRIL (ZESTRIL) 5 MG TABLET    TAKE 1/2 TABLET(2.5MG ) BY MOUTH ONCE DAILY.   MIRTAZAPINE (REMERON) 30 MG TABLET    TAKE (1) TABLET BY MOUTH AT BEDTIME FOR MOOD/INSOMNIA/WEIGHT LOSS.   MONTELUKAST (SINGULAIR) 10 MG TABLET    Take 1 tablet (10 mg total) by mouth daily.   MULTIPLE VITAMINS-MINERALS (EMERGEN-C VITAMIN C PO)    Take 1 packet by mouth daily. Mix with water   OXYMETAZOLINE (AFRIN NASAL SPRAY) 0.05 % NASAL SPRAY    Place 2 sprays into both nostrils 2 (two) times daily as needed. 2 sprays BID prn nose nose bleed   ROSUVASTATIN (CRESTOR) 20 MG TABLET    TAKE (1) TABLET BY MOUTH AT BEDTIME.   TROLAMINE SALICYLATE (ASPERCREME EX)    Apply 1 application topically daily as needed (knee pain).  Modified Medications   No medications on file  Discontinued Medications   No medications on file    Physical Exam:  Vitals:   02/15/23 1502 02/15/23 1506  BP: (!) 170/76 (!) 150/60  Pulse: (!) 109   Resp: 16   Temp: 97.9 F (36.6 C)   SpO2: 92%   Weight: 113 lb (51.3 kg)   Height: 5\' 1"  (1.549 m)    Body mass index is 21.35 kg/m. Wt Readings from Last 3 Encounters:  02/15/23 113 lb (51.3 kg)   11/09/22 112 lb 9.6 oz (51.1 kg)  11/08/22 112 lb (50.8 kg)    Physical Exam Vitals and nursing note reviewed.  Constitutional:      Appearance: Normal appearance.  Eyes:     Extraocular Movements: Extraocular movements intact.     Pupils: Pupils are equal, round, and reactive to light.  Cardiovascular:     Rate and Rhythm: Normal rate and regular rhythm.  Pulmonary:     Effort: Pulmonary effort is normal.     Breath sounds: Normal breath sounds.  Abdominal:     General: Bowel sounds are normal.     Palpations: Abdomen is soft.  Neurological:     General: No focal deficit present.     Mental Status: She is alert and oriented to person, place, and time.  Psychiatric:        Mood and Affect: Mood normal.        Behavior: Behavior normal.     Labs reviewed: Basic Metabolic Panel: Recent Labs    05/10/22 1532 05/11/22 0958 05/13/22 0358 05/19/22 1517 08/08/22 1413 11/09/22 1644  NA 133*   < > 134* 136 138 140  K 4.1   < > 3.5 4.6 4.5 4.0  CL 98   < > 100 102 104 103  CO2 28   < > 25 25 26 29   GLUCOSE 92   < > 103* 87 87 99  BUN 19   < > 15 14 16 25   CREATININE 0.96   < > 1.02* 0.95 1.24* 1.23*  CALCIUM 10.0   < > 9.1 9.3 9.4 9.4  MG  --   --  2.1  --   --   --   TSH 0.623  --   --   --   --   --    < > =  values in this interval not displayed.   Liver Function Tests: Recent Labs    05/10/22 1532 05/11/22 0958 05/13/22 0358 05/19/22 1517 08/08/22 1413 11/09/22 1644  AST 53* 50* 38 48* 45* 53*  ALT 30 26 22 23 19 22   ALKPHOS 53 47 41  --   --   --   BILITOT 1.5* 1.7* 1.3* 1.2 1.0 1.1  PROT 7.7 6.1* 5.6* 6.4 6.3 6.7  ALBUMIN 4.3 3.3* 3.1*  --   --   --    No results for input(s): "LIPASE", "AMYLASE" in the last 8760 hours. Recent Labs    05/10/22 1532 05/19/22 1517  AMMONIA 19 <10   CBC: Recent Labs    05/10/22 1532 05/12/22 0311 05/19/22 1517 08/08/22 1413 11/09/22 1644  WBC 8.0   < > 6.1 6.3 8.8  NEUTROABS 5.7  --  3,129 3,522  --   HGB  10.8*   < > 9.6* 8.9* 9.1*  HCT 34.2*   < > 30.2* 27.8* 29.0*  MCV 91.7   < > 92.1 88.5 89.0  PLT 280   < > 269 273 264   < > = values in this interval not displayed.   Lipid Panel: Recent Labs    03/26/22 0137  CHOL 204*  HDL 86  LDLCALC 103*  TRIG 77  CHOLHDL 2.4   TSH: Recent Labs    05/10/22 1532  TSH 0.623   A1C: Lab Results  Component Value Date   HGBA1C 5.4 03/26/2022     Assessment/Plan  1. Iron deficiency anemia, unspecified iron deficiency anemia type Last hemoglobin 9.1 would like to reassess today.  Has been on iron sulfate 3 times a week  2. Primary hypertension Blood pressures are fluctuating she is only on lisinopril 2.5 mg initial pressure today in the office was 170/76 recheck 150/60 I have ask facility where she lives to check her blood pressure at least weekly sometimes a.m. and sometimes p.m. to see if we should possibly increase her lisinopril to 5 mg  3. Dyslipidemia, goal LDL below 70 Taking rosuvastatin 20 mg last LDL was 103 in September 23.  She does have a high level of HDL at 86  4. S/P TAVR (transcatheter aortic valve replacement) Followed by cardiology she does take Pradaxa 150 mg  5. Cognitive and behavioral changes Patient's behavior has improved today.  Scored 23 of 30 on MMSE.  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

## 2023-02-16 LAB — BASIC METABOLIC PANEL WITH GFR
BUN/Creatinine Ratio: 20 (calc) (ref 6–22)
BUN: 21 mg/dL (ref 7–25)
CO2: 29 mmol/L (ref 20–32)
Calcium: 9.5 mg/dL (ref 8.6–10.4)
Chloride: 103 mmol/L (ref 98–110)
Creat: 1.07 mg/dL — ABNORMAL HIGH (ref 0.60–0.95)
Glucose, Bld: 95 mg/dL (ref 65–99)
Potassium: 4.1 mmol/L (ref 3.5–5.3)
Sodium: 139 mmol/L (ref 135–146)
eGFR: 51 mL/min/{1.73_m2} — ABNORMAL LOW (ref >=60)

## 2023-02-16 LAB — CBC
HCT: 31.7 % — ABNORMAL LOW (ref 35.0–45.0)
Hemoglobin: 10.1 g/dL — ABNORMAL LOW (ref 11.7–15.5)
MCV: 90.8 fL (ref 80.0–100.0)
WBC: 6.9 10*3/uL (ref 3.8–10.8)

## 2023-03-07 ENCOUNTER — Other Ambulatory Visit: Payer: Self-pay | Admitting: Family

## 2023-03-07 ENCOUNTER — Other Ambulatory Visit: Payer: Self-pay | Admitting: Family Medicine

## 2023-03-07 ENCOUNTER — Encounter: Payer: Self-pay | Admitting: Cardiology

## 2023-03-07 ENCOUNTER — Emergency Department (HOSPITAL_COMMUNITY)
Admission: EM | Admit: 2023-03-07 | Discharge: 2023-03-07 | Disposition: A | Discharge status: Home / Self Care | Payer: Medicare Other | Attending: Emergency Medicine | Admitting: Emergency Medicine

## 2023-03-07 ENCOUNTER — Encounter (HOSPITAL_COMMUNITY): Payer: Self-pay | Admitting: Emergency Medicine

## 2023-03-07 DIAGNOSIS — D649 Anemia, unspecified: Secondary | ICD-10-CM

## 2023-03-07 DIAGNOSIS — E871 Hypo-osmolality and hyponatremia: Secondary | ICD-10-CM | POA: Insufficient documentation

## 2023-03-07 DIAGNOSIS — R030 Elevated blood-pressure reading, without diagnosis of hypertension: Secondary | ICD-10-CM

## 2023-03-07 DIAGNOSIS — Z79899 Other long term (current) drug therapy: Secondary | ICD-10-CM | POA: Insufficient documentation

## 2023-03-07 DIAGNOSIS — I10 Essential (primary) hypertension: Secondary | ICD-10-CM | POA: Insufficient documentation

## 2023-03-07 DIAGNOSIS — R42 Dizziness and giddiness: Secondary | ICD-10-CM | POA: Insufficient documentation

## 2023-03-07 LAB — BASIC METABOLIC PANEL
Anion gap: 8 (ref 5–15)
BUN: 15 mg/dL (ref 8–23)
CO2: 25 mmol/L (ref 22–32)
Calcium: 9.1 mg/dL (ref 8.9–10.3)
Chloride: 95 mmol/L — ABNORMAL LOW (ref 98–111)
Creatinine, Ser: 0.94 mg/dL (ref 0.44–1.00)
GFR, Estimated: 59 mL/min — ABNORMAL LOW (ref >60)
Glucose, Bld: 97 mg/dL (ref 70–99)
Potassium: 4 mmol/L (ref 3.5–5.1)
Sodium: 128 mmol/L — ABNORMAL LOW (ref 135–145)

## 2023-03-07 LAB — CBC
HCT: 31.1 % — ABNORMAL LOW (ref 36.0–46.0)
Hemoglobin: 9.9 g/dL — ABNORMAL LOW (ref 12.0–15.0)
MCH: 28.8 pg (ref 26.0–34.0)
MCHC: 31.8 g/dL (ref 30.0–36.0)
MCV: 90.4 fL (ref 80.0–100.0)
Platelets: 260 10*3/uL (ref 150–400)
RBC: 3.44 MIL/uL — ABNORMAL LOW (ref 3.87–5.11)
RDW: 13.6 % (ref 11.5–15.5)
WBC: 7.3 10*3/uL (ref 4.0–10.5)
nRBC: 0 % (ref 0.0–0.2)

## 2023-03-07 LAB — TROPONIN I (HIGH SENSITIVITY): Troponin I (High Sensitivity): 15 ng/L (ref <18)

## 2023-03-07 MED ORDER — SODIUM CHLORIDE 0.9 % IV BOLUS
500.0000 mL | Freq: Once | INTRAVENOUS | Status: AC
  Administered 2023-03-07: 500 mL via INTRAVENOUS

## 2023-03-07 MED ORDER — DABIGATRAN ETEXILATE MESYLATE 150 MG PO CAPS
150.0000 mg | ORAL_CAPSULE | Freq: Two times a day (BID) | ORAL | Status: DC
  Administered 2023-03-07: 150 mg via ORAL
  Filled 2023-03-07: qty 1

## 2023-03-07 MED ORDER — LISINOPRIL 10 MG PO TABS
5.0000 mg | ORAL_TABLET | Freq: Once | ORAL | Status: AC
  Administered 2023-03-07: 5 mg via ORAL
  Filled 2023-03-07: qty 1

## 2023-03-07 NOTE — Discharge Instructions (Addendum)
It was our pleasure to provide your ER care today - we hope that you feel better.  From today's labs, your sodium level is low (128) - drink adequate fluids and stay hydrated, but avoid excessive amounts of water, and do drink electrolyte containing fluids. Follow up closely with your doctor in the next few days for recheck.   Your blood pressure is high, increase lisinopril to 10 mg a day, and follow up with your doctor/cardiologist in the coming week.   Return to ER if worse, new symptoms, chest pain, trouble breathing, or other concern.

## 2023-03-07 NOTE — ED Provider Notes (Signed)
[place] EMERGENCY DEPARTMENT AT [facility] Provider Note   CSN: [id number] Arrival date & time: 03/07/23  1742     History  Chief Complaint  Patient presents with   Hypertension   Dizziness    Barbara Mcconnell is a 88 y.o. female.  Pt c/o blood pressure being high on home machine today, with systolic readings in 170-200 range. Pt indicates compliant w normal meds, no recent change in meds or doses. Currently indicates feels ok, no specific pain or other c/o. No headache. No chest pain or sob. No abd pain or nv. No increased leg edema. No change in speech or vision. No new numbness/weakness, or problems w balance.   The history is provided by the patient, medical records, a relative and the EMS personnel.  Hypertension Pertinent negatives include no chest pain, no abdominal pain, no headaches and no shortness of breath.  Dizziness Associated symptoms: no chest pain, no headaches, no shortness of breath, no vomiting and no weakness        Home Medications Prior to Admission medications   Medication Sig Start Date End Date Taking? Authorizing Provider  acetaminophen (TYLENOL) 500 MG tablet Take 500-1,000 mg by mouth every 6 (six) hours as needed for moderate pain.   Yes [provider]  ACID REDUCER 10 MG tablet TAKE (1) TABLET BY MOUTH TWICE DAILY FOR GERD. Patient taking differently: Take 10 mg by mouth 2 (two) times daily. 02/01/23  Yes Frederica Kuster, MD  albuterol (VENTOLIN HFA) 108 (90 Base) MCG/ACT inhaler Inhale 2 puffs into the lungs every 4 (four) hours as needed for wheezing or shortness of breath (seasonal allergies). 02/04/19  Yes [provider]  dabigatran (PRADAXA) 150 MG CAPS capsule TAKE (1) CAPSULE BY MOUTH TWICE DAILY. Patient taking differently: Take 150 mg by mouth 2 (two) times daily. 02/01/23  Yes Frederica Kuster, MD  docusate sodium (COLACE) 100 MG capsule TAKE (1) CAPSULE BY MOUTH ONCE DAILY FOR CONSTIPATION. Patient taking  differently: Take 100 mg by mouth daily. 03/07/23  Yes Frederica Kuster, MD  escitalopram (LEXAPRO) 10 MG tablet TAKE (1) TABLET BY MOUTH ONCE DAILY. Patient taking differently: Take 5 mg by mouth daily. 02/01/23  Yes Frederica Kuster, MD  ferrous sulfate (FEROSUL) 325 (65 FE) MG tablet TAKE (1) TABLET BY MOUTH THREE TIMES WEEKLY. **MON, WED, FRI** Patient taking differently: Take 325 mg by mouth See admin instructions. Take one tablet by mouth three times a week on Monday, Wednesday and Fridays per patient 03/07/23  Yes Frederica Kuster, MD  fexofenadine (ALLEGRA) 60 MG tablet TAKE (1) TABLET BY MOUTH ONCE EVERY MORNING. Patient taking differently: Take 60 mg by mouth daily. 02/01/23  Yes Frederica Kuster, MD  furosemide (LASIX) 20 MG tablet TAKE (1) TABLET BY MOUTH ONCE DAILY IN THE MORNING. Patient taking differently: Take 20 mg by mouth daily. 02/01/23  Yes Frederica Kuster, MD  lisinopril (ZESTRIL) 5 MG tablet TAKE 1/2 TABLET(2.5MG ) BY MOUTH ONCE DAILY. Patient taking differently: 2.5 mg. 02/01/23  Yes Frederica Kuster, MD  mirtazapine (REMERON) 30 MG tablet TAKE (1) TABLET BY MOUTH AT BEDTIME FOR MOOD/INSOMNIA/WEIGHT LOSS. Patient taking differently: Take 30 mg by mouth at bedtime. 02/01/23  Yes Frederica Kuster, MD  montelukast (SINGULAIR) 10 MG tablet Take 1 tablet (10 mg total) by mouth daily. 06/15/22  Yes Frederica Kuster, MD  Multiple Vitamins-Minerals (EMERGEN-C VITAMIN C PO) Take 1 packet by mouth daily. Mix with water   Yes  [provider]  oxymetazoline (AFRIN NASAL SPRAY) 0.05 % nasal spray Place 2 sprays into both nostrils 2 (two) times daily as needed. 2 sprays BID prn nose nose bleed Patient taking differently: Place 2 sprays into both nostrils 2 (two) times daily as needed for congestion. 09/15/22  Yes Wert, Trula Ore, NP  rosuvastatin (CRESTOR) 20 MG tablet TAKE (1) TABLET BY MOUTH AT BEDTIME. Patient taking differently: Take 20 mg by mouth daily. 02/01/23  Yes Frederica Kuster, MD  Trolamine Salicylate (ASPERCREME EX) Apply 1 application topically daily as needed (knee pain).   Yes [provider]      Allergies    Codeine, Cyclobenzaprine, Iodinated contrast media, and Prednisone    Review of Systems   Review of Systems  Constitutional:  Negative for fever.  Eyes:  Negative for visual disturbance.  Respiratory:  Negative for shortness of breath.   Cardiovascular:  Negative for chest pain.  Gastrointestinal:  Negative for abdominal pain and vomiting.  Genitourinary:  Negative for dysuria and flank pain.  Musculoskeletal:  Negative for neck pain.  Neurological:  Positive for dizziness. Negative for weakness, numbness and headaches.  Psychiatric/Behavioral:  Negative for confusion.     Physical Exam Updated Vital Signs BP (!) 181/66   Pulse 66   Temp (!) 97.4 F (36.3 C) (Oral)   Resp (!) 21   Ht 1.549 m (5\' 1" )   Wt 51.3 kg   SpO2 98%   BMI 21.35 kg/m  Physical Exam Vitals and nursing note reviewed.  Constitutional:      Appearance: Normal appearance. She is well-developed.  HENT:     Head: Atraumatic.     Nose: Nose normal.     Mouth/Throat:     Mouth: Mucous membranes are moist.  Eyes:     General: No scleral icterus.    Conjunctiva/sclera: Conjunctivae normal.     Pupils: Pupils are equal, round, and reactive to light.  Neck:     Vascular: No carotid bruit.     Trachea: No tracheal deviation.  Cardiovascular:     Rate and Rhythm: Normal rate and regular rhythm.     Pulses: Normal pulses.     Heart sounds: Normal heart sounds. No murmur heard.    No friction rub. No gallop.  Pulmonary:     Effort: Pulmonary effort is normal. No respiratory distress.     Breath sounds: Normal breath sounds.  Abdominal:     General: There is no distension.     Palpations: Abdomen is soft.     Tenderness: There is no abdominal tenderness.     Comments: No bruits.   Musculoskeletal:        General: No swelling or tenderness.      Cervical back: Normal range of motion and neck supple. No rigidity. No muscular tenderness.  Skin:    General: Skin is warm and dry.     Findings: No rash.  Neurological:     Mental Status: She is alert.     Comments: Alert, speech normal. Motor/sens grossly intact.   Psychiatric:        Mood and Affect: Mood normal.     ED Results / Procedures / Treatments   Labs (all labs ordered are listed, but only abnormal results are displayed) Results for orders placed or performed during the hospital encounter of 03/07/23  Basic metabolic panel  Result Value Ref Range   Sodium 128 (L) 135 - 145 mmol/L   Potassium 4.0 3.5 -  5.1 mmol/L   Chloride 95 (L) 98 - 111 mmol/L   CO2 25 22 - 32 mmol/L   Glucose, Bld 97 70 - 99 mg/dL   BUN 15 8 - 23 mg/dL   Creatinine, Ser 1.61 0.44 - 1.00 mg/dL   Calcium 9.1 8.9 - 09.6 mg/dL   GFR, Estimated 59 (L) >60 mL/min   Anion gap 8 5 - 15  CBC  Result Value Ref Range   WBC 7.3 4.0 - 10.5 K/uL   RBC 3.44 (L) 3.87 - 5.11 MIL/uL   Hemoglobin 9.9 (L) 12.0 - 15.0 g/dL   HCT 04.5 (L) 40.9 - 81.1 %   MCV 90.4 80.0 - 100.0 fL   MCH 28.8 26.0 - 34.0 pg   MCHC 31.8 30.0 - 36.0 g/dL   RDW 91.4 78.2 - 95.6 %   Platelets 260 150 - 400 K/uL   nRBC 0.0 0.0 - 0.2 %  Troponin I (High Sensitivity)  Result Value Ref Range   Troponin I (High Sensitivity) 15 <18 ng/L    EKG None  Radiology No results found.  Procedures Procedures    Medications Ordered in ED Medications  dabigatran (PRADAXA) capsule 150 mg (150 mg Oral Given 03/07/23 2015)  lisinopril (ZESTRIL) tablet 5 mg (5 mg Oral Given 03/07/23 1918)  sodium chloride 0.9 % bolus 500 mL (500 mLs Intravenous Bolus 03/07/23 2030)    ED Course/ Medical Decision Making/ A&P                                 Medical Decision Making Problems Addressed: Elevated blood pressure reading: acute illness or injury Essential hypertension: chronic illness or injury with exacerbation, progression, or side effects  of treatment that poses a threat to life or bodily functions Hyponatremia: acute illness or injury with systemic symptoms that poses a threat to life or bodily functions Uncontrolled hypertension: acute illness or injury with systemic symptoms that poses a threat to life or bodily functions  Amount and/or Complexity of Data Reviewed Independent Historian:     Details: Ems/family, hx External Data Reviewed: labs and notes. Labs: ordered. Decision-making details documented in ED Course. ECG/medicine tests: ordered.  Risk Prescription drug management. Decision regarding hospitalization.   Iv ns. Continuous pulse ox and cardiac monitoring. Labs ordered/sent.  Differential diagnosis includes uncontrolled htn, aki, etc. Dispo decision including potential need for admission considered - will get labs and reassess.   Reviewed nursing notes and prior charts for additional history. External reports reviewed. Additional history from: family, ems.   Cardiac monitor: sinus rhythm, rate 70. BP elevated. Pt given additional dose of her bp med, lisinopril 5 mg po.   Labs reviewed/interpreted by me - chem w na mildly low, 128. Pt indicates does drink good amount of water, pt is on lasix. NS bolus iv.  Daughter requests trop be added to labs/workup. Pt denies chest pain or discomfort. Trop is normal.   BP is high, but does not require further emergent lowering. Rec heart health meal plan, continue meds, and pcp f/u.   Return precautions provided.          Final Clinical Impression(s) / ED Diagnoses Final diagnoses:  None    Rx / DC Orders ED Discharge Orders     None         Cathren Laine, MD 03/07/23 2119

## 2023-03-07 NOTE — ED Triage Notes (Signed)
Pt BIB [facility] from [facility]. Daughter called because patient was having elevated blood pressure, light headedness and neck pain high upper back. Daughter said the patient normally have neck pain, but it seem more intense today. Pt have history of aphasia from a prior stroke on 03/25/22, but daughter said that was normal for her. EMS reported patient took blood pressure at 11am and it was 149/68. The daughter and facility continue to take patient blood pressure at 4: 34 pm and it was 216/90. When EMS got there and took patient blood pressure in the right arm it was 212/108 and left arm 210/104. When EMS got to the Emergency room patient blood was check manually and it was 196/86. Pulse 84, RR 18, SPO2 98% and blood sugar 118. She takes Lisinopril 2.5 mg daily and she is also on Pradaxa 150 mg twice daily.  Patient is A & O x4

## 2023-03-08 ENCOUNTER — Telehealth: Payer: Self-pay | Admitting: Cardiology

## 2023-03-08 ENCOUNTER — Ambulatory Visit: Payer: Medicare Other | Attending: Physician Assistant | Admitting: Physician Assistant

## 2023-03-08 ENCOUNTER — Encounter: Payer: Self-pay | Admitting: Physician Assistant

## 2023-03-08 VITALS — BP 150/60 | HR 91 | Ht 61.0 in | Wt 113.0 lb

## 2023-03-08 DIAGNOSIS — Z952 Presence of prosthetic heart valve: Secondary | ICD-10-CM | POA: Insufficient documentation

## 2023-03-08 DIAGNOSIS — I5181 Takotsubo syndrome: Secondary | ICD-10-CM | POA: Insufficient documentation

## 2023-03-08 DIAGNOSIS — Z7901 Long term (current) use of anticoagulants: Secondary | ICD-10-CM | POA: Diagnosis present

## 2023-03-08 DIAGNOSIS — I10 Essential (primary) hypertension: Secondary | ICD-10-CM | POA: Diagnosis present

## 2023-03-08 DIAGNOSIS — I480 Paroxysmal atrial fibrillation: Secondary | ICD-10-CM | POA: Insufficient documentation

## 2023-03-08 DIAGNOSIS — I160 Hypertensive urgency: Secondary | ICD-10-CM | POA: Insufficient documentation

## 2023-03-08 MED ORDER — LISINOPRIL 5 MG PO TABS
5.0000 mg | ORAL_TABLET | Freq: Two times a day (BID) | ORAL | Status: DC
Start: 2023-03-08 — End: 2023-04-11

## 2023-03-08 NOTE — Telephone Encounter (Signed)
New Message:      Daughter called and said patient did go to the ER yesterday for her high blood pressure. She said she was instructed to call and make an appointment for the patient here this week.

## 2023-03-08 NOTE — Progress Notes (Signed)
Cardiology Office Note:    Date:  03/08/2023   ID:  [name], DOB 02-04-1935, MRN [id number]  PCP:  Frederica Kuster, MD   [facility] Providers Cardiologist:  Peter Swaziland, MD     Referring MD: Frederica Kuster, MD   Chief Complaint  Patient presents with   Hypertension    Patient was seen at the ER due to high blood pressure. Blood pressure readings has been over  220 systolic  and over 100 diastolic. Meds reviewed.     History of Present Illness:    Barbara Mcconnell is a 88 y.o. female with a hx of aortic stenosis s/p TAVR, history of Takotsubo cardiomyopathy 7 years ago in [facility] without records, hypertension, PVD, moderate carotid artery stenosis, and hyperlipidemia with statin intolerance.  LHC 04/2019 for chest pain and diaphoresis showed 50% RCA stenosis but otherwise normal coronaries with an EF of 30 to 35% with moderate AAS.  LV pattern consistent with Takotsubo cardiomyopathy. Unfortunately she did not follow up, but did have a repeat echocardiogram March 2021 and EF had normalized, unfortunately she continued to have moderate to severe aortic stenosis.  Repeat echocardiogram was recommended in 6 months.  She did not have follow-up until she presented to [facility] 06/2021 for acute dyspnea.  Echocardiogram with preserved LVEF and moderate circumferential pericardial effusion measuring 1.5 cm.  Moderate AAS with a mean gradient 33 mmHg, increased from 20.5 mmHg.  DI measured 0.29.  Right left heart catheterization 07/23/2021 showed stable mild to moderate CAD but pulmonary hypertension.  Hospital course was complicated by atrial fibrillation with RVR, converted with IV Cardizem.  She underwent pericardiocentesis 07/28/2021 with drain placement and was subsequently discharged 07/30/2021.  She eventually proceeded to TAVR 08/03/2021.  Heart monitor did show A-fib and she was transition from aspirin 81 mg to Eliquis 2.5 mg twice daily.  Unfortunately she was admitted 03/2022 with  acute left MCA stroke.  This occurred in the setting of Eliquis therapy and she was switched to Pradaxa and discharged to SNF.  She was last seen by Dr. Swaziland 08/29/2022 and was doing well.  She developed elevated blood pressure and was seen in the ER 03/07/2023.  SBP 170-200 range at home.  BP on arrival 181/66.  EDP did not feel she needed emergent blood pressure lowering and discharged her without admission with close follow-up with cardiology. They increased her lisinopril from 2.5 mg to 10 mg, which she took this morning.   Of note, Na was also 128, treated with IVF. She also having diarrhea over the last few days.   She was added to my schedule today. Daughter is here and helps with history. SBP has drifted into the 140-170s over the last month.   Past Medical History:  Diagnosis Date   Allergies    Anxiety    panic attacks   Aortic stenosis    Arthritis    Bronchiectasis with (acute) exacerbation (HCC)    CAD (coronary artery disease)    non obst CAD   Depression due to acute stroke (HCC)    Dizziness    Elevated liver enzymes    Fall    Fluid overload    GERD (gastroesophageal reflux disease)    Heart murmur    Hemorrhoids    History of mammogram    Hyperlipemia    Hypertension    Internal carotid artery stenosis    OCD (obsessive compulsive disorder)    Orthostatic hypotension    Other cirrhosis  of liver (HCC)    PAF (paroxysmal atrial fibrillation) (HCC)    Pericardial effusion    Physical deconditioning    Renal insufficiency    Rheumatic heart failure (congestive) (HCC)    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 Lynnette Caffey & Dr. Laneta Simmers   Seasonal allergies    Severe aortic stenosis    Stroke [facility])    Takotsubo cardiomyopathy     Past Surgical History:  Procedure Laterality Date   COLONOSCOPY     EYE SURGERY Bilateral    cataracts removed   INTRAOPERATIVE TRANSTHORACIC  ECHOCARDIOGRAM N/A 08/03/2021   Procedure: INTRAOPERATIVE TRANSTHORACIC ECHOCARDIOGRAM;  Surgeon: Orbie Pyo, MD;  Location: [facility] INVASIVE CV LAB;  Service: Open Heart Surgery;  Laterality: N/A;   IR CT HEAD LTD  03/25/2022   IR PERCUTANEOUS ART THROMBECTOMY/INFUSION INTRACRANIAL INC DIAG ANGIO  03/25/2022   IR US GUIDE VASC ACCESS RIGHT  03/25/2022   PERICARDIOCENTESIS N/A 07/28/2021   Procedure: PERICARDIOCENTESIS;  Surgeon: Orbie Pyo, MD;  Location: [facility] INVASIVE CV LAB;  Service: Cardiovascular;  Laterality: N/A;   RADIOLOGY WITH ANESTHESIA N/A 03/25/2022   Procedure: IR WITH ANESTHESIA;  Surgeon: Radiologist, Medication, MD;  Location: [facility] OR;  Service: Radiology;  Laterality: N/A;   RIGHT/LEFT HEART CATH AND CORONARY ANGIOGRAPHY N/A 05/13/2019   Procedure: RIGHT/LEFT HEART CATH AND CORONARY ANGIOGRAPHY;  Surgeon: Swaziland, Peter M, MD;  Location: [facility] INVASIVE CV LAB;  Service: Cardiovascular;  Laterality: N/A;   RIGHT/LEFT HEART CATH AND CORONARY ANGIOGRAPHY N/A 07/23/2021   Procedure: RIGHT/LEFT HEART CATH AND CORONARY ANGIOGRAPHY;  Surgeon: Corky Crafts, MD;  Location: [facility] INVASIVE CV LAB;  Service: Cardiovascular;  Laterality: N/A;   TONSILLECTOMY     TRANSCATHETER AORTIC VALVE REPLACEMENT, TRANSFEMORAL N/A 08/03/2021   Procedure: TRANSCATHETER AORTIC VALVE REPLACEMENT, TRANSFEMORAL;  Surgeon: Orbie Pyo, MD;  Location: [facility] INVASIVE CV LAB;  Service: Open Heart Surgery;  Laterality: N/A;   UPPER GI ENDOSCOPY      Current Medications: Current Meds  Medication Sig   acetaminophen (TYLENOL) 500 MG tablet Take 500-1,000 mg by mouth every 6 (six) hours as needed for moderate pain.   ACID REDUCER 10 MG tablet TAKE (1) TABLET BY MOUTH TWICE DAILY FOR GERD. (Patient taking differently: Take 10 mg by mouth 2 (two) times daily.)   albuterol (VENTOLIN HFA) 108 (90 Base) MCG/ACT inhaler Inhale 2 puffs into the lungs every 4 (four) hours as needed for wheezing or shortness of breath  (seasonal allergies).   dabigatran (PRADAXA) 150 MG CAPS capsule TAKE (1) CAPSULE BY MOUTH TWICE DAILY. (Patient taking differently: Take 150 mg by mouth 2 (two) times daily.)   docusate sodium (COLACE) 100 MG capsule TAKE (1) CAPSULE BY MOUTH ONCE DAILY FOR CONSTIPATION. (Patient taking differently: Take 100 mg by mouth daily.)   escitalopram (LEXAPRO) 10 MG tablet TAKE (1) TABLET BY MOUTH ONCE DAILY. (Patient taking differently: Take 5 mg by mouth daily.)   ferrous sulfate (FEROSUL) 325 (65 FE) MG tablet TAKE (1) TABLET BY MOUTH THREE TIMES WEEKLY. **MON, WED, FRI** (Patient taking differently: Take 325 mg by mouth See admin instructions. Take one tablet by mouth three times a week on Monday, Wednesday and Fridays per patient)   fexofenadine (ALLEGRA) 60 MG tablet TAKE (1) TABLET BY MOUTH ONCE EVERY MORNING. (Patient taking differently: Take 60 mg by mouth daily.)   furosemide (LASIX) 20 MG tablet TAKE (1) TABLET  BY MOUTH ONCE DAILY IN THE MORNING. (Patient taking differently: Take 20 mg by mouth daily.)   lisinopril (ZESTRIL) 5 MG tablet Take 1 tablet (5 mg total) by mouth 2 (two) times daily.   montelukast (SINGULAIR) 10 MG tablet Take 1 tablet (10 mg total) by mouth daily.   Multiple Vitamins-Minerals (EMERGEN-C VITAMIN C PO) Take 1 packet by mouth daily. Mix with water   oxymetazoline (AFRIN NASAL SPRAY) 0.05 % nasal spray Place 2 sprays into both nostrils 2 (two) times daily as needed. 2 sprays BID prn nose nose bleed (Patient taking differently: Place 2 sprays into both nostrils 2 (two) times daily as needed for congestion.)   rosuvastatin (CRESTOR) 20 MG tablet TAKE (1) TABLET BY MOUTH AT BEDTIME. (Patient taking differently: Take 20 mg by mouth daily.)   Trolamine Salicylate (ASPERCREME EX) Apply 1 application topically daily as needed (knee pain).   [DISCONTINUED] lisinopril (ZESTRIL) 5 MG tablet TAKE 1/2 TABLET(2.5MG ) BY MOUTH ONCE DAILY. (Patient taking differently: 2.5 mg. 10 mg taken this  morning(03/08/23) and 5 mg taken last night(03/07/23))     Allergies:   Codeine, Cyclobenzaprine, Iodinated contrast media, and Prednisone   Social History   Socioeconomic History   Marital status: Divorced    Spouse name: Not on file   Number of children: Not on file   Years of education: Not on file   Highest education level: 12th grade  Occupational History   Not on file  Tobacco Use   Smoking status: Former    Current packs/day: 1.00    Average packs/day: 1 pack/day for 20.0 years (20.0 ttl pk-yrs)    Types: Cigarettes   Smokeless tobacco: Never   Tobacco comments:    Quit 30 yrs ago as of 07/2021 per daughter  Vaping Use   Vaping status: Never Used  Substance and Sexual Activity   Alcohol use: Yes    Comment: occasionally wine   Drug use: Never   Sexual activity: Not Currently    Birth control/protection: Post-menopausal  Other Topics Concern   Not on file  Social History Narrative   Tobacco use, amount per day now: None   Past tobacco use, amount per day: Up to 1 pack   How many years did you use tobacco: 20 years.   Alcohol use (drinks per week): None.   Diet: 3 meals daily/mostly cooked at home.   Do you drink/eat things with caffeine: periodic diet coke.   Marital status:  Divorced.                                What year were you married? 1968   Do you live in a house, apartment, assisted living, condo, trailer, etc.? One Level Townhome   Is it one or more stories? One Level   How many persons live in your home? One   Do you have pets in your home?( please list) No   Highest Level of education completed? High School   Current or past profession: Immunologist.    Do you exercise?   Yes when able.                               Type and how often? Silver Sneakers 2-3 times weekly.   Do you have a living will? Yes   Do you have a DNR form?  No                           If not, do you want to discuss one? Yes   Do you have signed POA/HPOA  forms?   Yes                     If so, please bring to you appointment      Do you have any difficulty bathing or dressing yourself? Yes   Do you have any difficulty preparing food or eating? Yes   Do you have any difficulty managing your medications? Yes   Do you have any difficulty managing your finances? No   Do you have any difficulty affording your medications? No   Social Determinants of Health   Financial Resource Strain: Medium Risk (11/07/2022)   Overall Financial Resource Strain (CARDIA)    Difficulty of Paying Living Expenses: Somewhat hard  Food Insecurity: No Food Insecurity (11/07/2022)   Hunger Vital Sign    Worried About Running Out of Food in the Last Year: Never true    Ran Out of Food in the Last Year: Never true  Transportation Needs: No Transportation Needs (11/07/2022)   PRAPARE - Administrator, Civil Service (Medical): No    Lack of Transportation (Non-Medical): No  Physical Activity: Unknown (11/07/2022)   Exercise Vital Sign    Days of Exercise per Week: 0 days    Minutes of Exercise per Session: Not on file  Stress: Stress Concern Present (11/07/2022)   [employer] of Occupational Health - Occupational Stress Questionnaire    Feeling of Stress : Very much  Social Connections: Socially Isolated (11/07/2022)   Social Connection and Isolation Panel [NHANES]    Frequency of Communication with Friends and Family: Three times a week    Frequency of Social Gatherings with Friends and Family: Patient declined    Attends Religious Services: Never    Database administrator or Organizations: No    Attends Engineer, structural: Not on file    Marital Status: Divorced     Family History: The patient's family history includes Alcohol abuse in her sister; Brain cancer in her brother; Cancer in her brother and sister; Diabetes in her brother; Down syndrome in her brother; Endometriosis in her daughter; Heart Problems in her brother; Heart failure  in her brother and brother; Hypertension in an other family member; Lung cancer in her sister; Melanoma in her brother.  ROS:   Please see the history of present illness.     All other systems reviewed and are negative.  EKGs/Labs/Other Studies Reviewed:    The following studies were reviewed today:  Cardiac Studies & Procedures   CARDIAC CATHETERIZATION  CARDIAC CATHETERIZATION 07/28/2021  Narrative 1.  Successful pericardiocentesis and drain placement with evacuation of 400 cc of dark bloody fluid which was sent for analysis; the patient be transferred to the to heart unit for further monitoring.   CARDIAC CATHETERIZATION 07/23/2021  Narrative   Prox RCA lesion is 50% stenosed.   Prox Cx to Mid Cx lesion is 25% stenosed.   Prox LAD to Mid LAD lesion is 25% stenosed.   Hemodynamic findings consistent with pulmonary hypertension.  Stable, mild to moderate coronary artery disease.  No indication for PCI.  Unable to cross aortic valve, consistent with worsening noted on echocardiogram.  Continue plans for TAVR w/u.  Findings Coronary Findings Diagnostic  Dominance:  Right  Left Main Vessel is normal in caliber. Vessel is angiographically normal.  Left Anterior Descending Vessel is normal in caliber. The vessel exhibits minimal luminal irregularities. The vessel is mildly calcified. Prox LAD to Mid LAD lesion is 25% stenosed.  Left Circumflex Vessel is normal in caliber. The vessel exhibits minimal luminal irregularities. Prox Cx to Mid Cx lesion is 25% stenosed.  Right Coronary Artery Prox RCA lesion is 50% stenosed. The lesion is moderately calcified.  Intervention  No interventions have been documented.     ECHOCARDIOGRAM  ECHOCARDIOGRAM COMPLETE 08/10/2022  Narrative ECHOCARDIOGRAM REPORT    Patient Name:   Barbara Mcconnell  Date of Exam: 08/10/2022 Medical Rec #:  [id number]     Height:       61.0 in Accession #:    [id number]    Weight:       114.2 lb Date  of Birth:  04-11-1935      BSA:          1.488 m Patient Age:    87 years      BP:           120/68 mmHg Patient Gender: F             HR:           92 bpm. Exam Location:  [address]  Procedure: 2D Echo, Cardiac Doppler and Color Doppler  Indications:    Z95.2 S/P TAVR (23mm Patsy Lager)  History:        Patient has prior history of Echocardiogram examinations, most recent 03/26/2022. CAD, Signs/Symptoms:Murmur; Risk Factors:Hypertension, Dyslipidemia and Former Smoker. Aortic stenosis. PVD. Carotid artery stenosis. Takotsubo cardiomyopathy. Pericardial effusion. Orthostatic hypotension. Aortic Valve: 23 mm Sapien prosthetic, stented (TAVR) valve is present in the aortic position. Procedure Date: 08/03/2021.  Sonographer:    Cathie Beams RCS Referring Phys: [id number] KATHRYN R THOMPSON  IMPRESSIONS   1. On image 60, there is high velocity color flow seen extending into the LV septum. Cannot exclude VSD. Left ventricular ejection fraction, by estimation, is 65 to 70%. The left ventricle has normal function. The left ventricle has no regional wall motion abnormalities. There is severe asymmetric left ventricular hypertrophy of the septal segment. Left ventricular diastolic parameters are consistent with Grade I diastolic dysfunction (impaired relaxation). 2. Right ventricular systolic function is normal. The right ventricular size is normal. There is normal pulmonary artery systolic pressure. 3. Left atrial size was moderately dilated. 4. The mitral valve is degenerative. Trivial mitral valve regurgitation. Mild mitral stenosis. Severe mitral annular calcification. 5. Tricuspid valve regurgitation is mild to moderate. 6. The aortic valve has been repaired/replaced. Aortic valve regurgitation is trivial. 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 Procedure Date: 08/03/2021. Aortic regurgitation PHT measures 199 msec. Aortic valve area, by VTI measures  3.18 cm. Aortic valve mean gradient measures 14.3 mmHg. Aortic valve Vmax measures 2.43 m/s. 7. The inferior vena cava is normal in size with greater than 50% respiratory variability, suggesting right atrial pressure of 3 mmHg.  Comparison(s): Prior images reviewed side by side.  Conclusion(s)/Recommendation(s): S/P TAVR. There is trivial perivalvular leak seen with color flow and CW Doppler. There is again high velocity color flow seen in the ventricular septum, cannot exclude small VSD.  FINDINGS Left Ventricle: On image 60, there is high velocity color flow seen extending into the LV septum. Cannot exclude VSD. Left ventricular ejection fraction, by estimation, is 65 to 70%. The left  ventricle has normal function. The left ventricle has no regional wall motion abnormalities. The left ventricular internal cavity size was small. There is severe asymmetric left ventricular hypertrophy of the septal segment. Left ventricular diastolic parameters are consistent with Grade I diastolic dysfunction (impaired relaxation).  Right Ventricle: The right ventricular size is normal. No increase in right ventricular wall thickness. Right ventricular systolic function is normal. There is normal pulmonary artery systolic pressure. The tricuspid regurgitant velocity is 2.53 m/s, and with an assumed right atrial pressure of 3 mmHg, the estimated right ventricular systolic pressure is 28.6 mmHg.  Left Atrium: Left atrial size was moderately dilated.  Right Atrium: Right atrial size was normal in size.  Pericardium: There is no evidence of pericardial effusion.  Mitral Valve: The mitral valve is degenerative in appearance. Severe mitral annular calcification. Trivial mitral valve regurgitation. Mild mitral valve stenosis. The mean mitral valve gradient is 6.0 mmHg with average heart rate of 88 bpm.  Tricuspid Valve: The tricuspid valve is normal in structure. Tricuspid valve regurgitation is mild to moderate.  No evidence of tricuspid stenosis.  Aortic Valve: There is an echolucent space in the aortic root on image 4, with minimal color flow noted. This may be flow between the TAVR prosthesis and the native root. There is high velocity color flow at 9 o'clock on short axis view, which could be regurgitation vs. VSD. There is definitive aortic regurgitation seen on image 57. The aortic valve has been repaired/replaced. Aortic valve regurgitation is trivial. Aortic regurgitation PHT measures 199 msec. Aortic valve mean gradient measures 14.3 mmHg. Aortic valve peak gradient measures 23.6 mmHg. Aortic valve area, by VTI measures 3.18 cm. 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 Procedure Date: 08/03/2021.  Pulmonic Valve: The pulmonic valve was not well visualized. Pulmonic valve regurgitation is trivial. No evidence of pulmonic stenosis.  Aorta: The aortic root, ascending aorta, aortic arch and descending aorta are all structurally normal, with no evidence of dilitation or obstruction.  Venous: The inferior vena cava is normal in size with greater than 50% respiratory variability, suggesting right atrial pressure of 3 mmHg.  IAS/Shunts: The atrial septum is grossly normal.   LEFT VENTRICLE PLAX 2D LVIDd:         3.00 cm   Diastology LVIDs:         1.70 cm   LV e' medial:    5.98 cm/s LV PW:         1.30 cm   LV E/e' medial:  15.4 LV IVS:        2.10 cm   LV e' lateral:   6.31 cm/s LVOT diam:     2.30 cm   LV E/e' lateral: 14.6 LV SV:         142 LV SV Index:   95 LVOT Area:     4.15 cm   RIGHT VENTRICLE RV Basal diam:  3.10 cm RV S prime:     13.50 cm/s TAPSE (M-mode): 1.8 cm  LEFT ATRIUM           Index        RIGHT ATRIUM           Index LA diam:      4.10 cm 2.75 cm/m   RA Area:     11.30 cm LA Vol (A2C): 51.3 ml 34.46 ml/m  RA Volume:   24.40 ml  16.39 ml/m LA Vol (A4C): 21.2 ml 14.24 ml/m  AORTIC VALVE AV Area (Vmax):    2.99  cm AV Area (Vmean):   2.74 cm AV Area (VTI):     3.18 cm AV Vmax:           243.00 cm/s AV Vmean:          174.667 cm/s AV VTI:            0.447 m AV Peak Grad:      23.6 mmHg AV Mean Grad:      14.3 mmHg LVOT Vmax:         175.00 cm/s LVOT Vmean:        115.000 cm/s LVOT VTI:          0.342 m LVOT/AV VTI ratio: 0.76 AI PHT:            199 msec  AORTA Ao Asc diam: 3.70 cm  MITRAL VALVE                TRICUSPID VALVE MV Area (PHT): 1.76 cm     TR Peak grad:   25.6 mmHg MV Mean grad:  6.0 mmHg     TR Vmax:        253.00 cm/s MV Decel Time: 431 msec MR Peak grad: 96.0 mmHg     SHUNTS MR Mean grad: 41.0 mmHg     Systemic VTI:  0.34 m MR Vmax:      490.00 cm/s   Systemic Diam: 2.30 cm MR Vmean:     270.0 cm/s MV E velocity: 92.00 cm/s MV A velocity: 149.00 cm/s MV E/A ratio:  0.62  Jodelle Red MD Electronically signed by Jodelle Red MD Signature Date/Time: 08/10/2022/6:39:01 PM    Final    MONITORS  LONG TERM MONITOR-LIVE TELEMETRY (3-14 DAYS) 06/07/2022  Narrative   Nromal sinus rhythm   Rare PVCs with bigeminy   Occasional PACs with short runs of SVT   On October 27 there were 2 episoded of SVT c/w atrial flutter at 2:48 and 5:42 pm. longest lasting 8 minutes and 24 seconds.   Patch Wear Time:  13 days and 21 hours (2023-10-20T13:40:10-398 to 2023-11-03T11:06:28-0400)  Patient had a min HR of 61 bpm, max HR of 211 bpm, and avg HR of 90 bpm. Predominant underlying rhythm was Sinus Rhythm. QRS morphology changes were present throughout recording. 53 Supraventricular Tachycardia runs occurred, the run with the fastest interval lasting 6 beats with a max rate of 211 bpm, the longest lasting 8 mins 24 secs with an avg rate of 158 bpm. Some episodes of Supraventricular Tachycardia may be possible Atrial Tachycardia with variable block. Isolated SVEs were occasional (1.6%, 28180), SVE Couplets were rare (<1.0%, 955), and SVE Triplets were rare (<1.0%,  127). Isolated VEs were rare (<1.0%, 12422), VE Couplets were rare (<1.0%, 213), and VE Triplets were rare (<1.0%, 63). Ventricular Bigeminy and Trigeminy were present.   CT SCANS  CT CORONARY MORPH W/CTA COR W/SCORE 07/24/2021  Addendum 07/27/2021  7:06 PM ADDENDUM REPORT: 07/27/2021 19:04  ADDENDUM: Extracardiac findings will be described separately under dictation for contemporaneously obtained CTA chest, abdomen, and pelvis.  IMPRESSION: Please see separate dictation for contemporaneously obtained CTA chest, abdomen, and pelvis dated 07/24/2021 for full description of relevant extracardiac findings.   Electronically Signed By: Helyn Numbers M.D. On: 07/27/2021 19:04  Narrative CLINICAL DATA:  Aortic Stenosis  EXAM: Cardiac TAVR CT  TECHNIQUE: The patient was scanned on a Siemens Force 192 slice scanner. A 120 kV retrospective scan was triggered  in the ascending thoracic aorta at 140 HU's. Gantry rotation speed was 250 msecs and collimation was .6 mm. No beta blockade or nitro were given. The 3D data set was reconstructed in 5% intervals of the R-R cycle. Systolic and diastolic phases were analyzed on a dedicated work station using MPR, MIP and VRT modes. The patient received 80 cc of contrast.  FINDINGS: Aortic Valve: Tri leaflet calcified score 2093  Aorta: Bovine arch no aneurysm moderate calcific atherosclerosis  Sino-tubular Junction: 25 nn  Ascending Thoracic Aorta: 35 mm  Aortic Arch: 27 mm  Descending Thoracic Aorta: 27 mm  Sinus of Valsalva Measurements:  Non-coronary: 29.3 mm  Right - coronary: 28.6 mm  Left -   coronary: 28 mm  Coronary Artery Height above Annulus:  Left Main: 15 mm above annulus  Right Coronary: 15.6 mm above annulus  Virtual Basal Annulus Measurements:  Average  diameter: 22.8 mm  Perimeter: 74.9 mm  Area: 407 mm2  AVA by planimetry 1.01 cm2  Coronary Arteries: Sufficient height above annulus for  deployment  Optimum Fluoroscopic Angle for Delivery: LAO 20 Caudal 19 degrees  IMPRESSION: 1. Tri leaflet AV with calcium score 2093 and planimetered AVA 1.01 cm2  2.  Annular area of 407 mm2 suitable for a 23 mm Sapien 3 valve  3.  Coronary arteries sufficient height above annulus for deployment  4. Optimal angiographic angle for deployment LAO 20 Caudal 19 degrees  5.  Large circumferential pericardial effusion  Charlton Haws  Electronically Signed: By: Charlton Haws M.D. On: 07/25/2021 06:57               Recent Labs: 05/10/2022: TSH 0.623 05/13/2022: Magnesium 2.1 11/09/2022: ALT 22 03/07/2023: BUN 15; Creatinine, Ser 0.94; Hemoglobin 9.9; Platelets 260; Potassium 4.0; Sodium 128  Recent Lipid Panel    Component Value Date/Time   CHOL 204 (H) 03/26/2022 0137   TRIG 77 03/26/2022 0137   HDL 86 03/26/2022 0137   CHOLHDL 2.4 03/26/2022 0137   VLDL 15 03/26/2022 0137   LDLCALC 103 (H) 03/26/2022 0137   LDLCALC 95 02/15/2022 1119     Risk Assessment/Calculations:    CHA2DS2-VASc Score = 8   This indicates a 10.8% annual risk of stroke. The patient's score is based upon: CHF History: 1 HTN History: 1 Diabetes History: 0 Stroke History: 2 Vascular Disease History: 1 Age Score: 2 Gender Score: 1            Physical Exam:    VS:  BP (!) 150/60 (BP Location: Left Arm, Patient Position: Sitting, Cuff Size: Normal)   Pulse 91   Ht 5\' 1"  (1.549 m)   Wt 113 lb (51.3 kg)   SpO2 96%   BMI 21.35 kg/m     Wt Readings from Last 3 Encounters:  03/08/23 113 lb (51.3 kg)  03/07/23 113 lb (51.3 kg)  02/15/23 113 lb (51.3 kg)     GEN:  Well nourished, well developed in no acute distress HEENT: Normal NECK: No JVD; No carotid bruits LYMPHATICS: No lymphadenopathy CARDIAC: RRR, 3/6 systolic murmur RESPIRATORY:  Clear to auscultation without rales, wheezing or rhonchi  ABDOMEN: Soft, non-tender, non-distended MUSCULOSKELETAL:  No edema; No deformity  SKIN:  Warm and dry NEUROLOGIC:  Alert and oriented x 3 PSYCHIATRIC:  Normal affect   ASSESSMENT:    1. Primary hypertension   2. Hypertensive urgency   3. S/P TAVR (transcatheter aortic valve replacement)   4. PAF (paroxysmal atrial fibrillation) (HCC)   5. Takotsubo  cardiomyopathy   6. Chronic anticoagulation    PLAN:    In order of problems listed above:  Hypertension Hypertensive urgency - BP can be labile - BP goal may be closer to 140s to avoid hypotension - will increase lisinopril to 5 mg BID with BP log - BMP in 1 week   History of Takotsubo cardiomyopathy LVEF recovered - appears euvolemic   AS s/p TAVR Heart murmur - noted that she did not have a loud murmur - given increased SOB and murmur, will repeat echo   PAF History of stroke on Eliquis Anticoagulated with Pradaxa No bleeding issues   Keep follow up as scheduled           Medication Adjustments/Labs and Tests Ordered: Current medicines are reviewed at length with the patient today.  Concerns regarding medicines are outlined above.  Orders Placed This Encounter  Procedures   Basic Metabolic Panel (BMET)   ECHOCARDIOGRAM COMPLETE   Meds ordered this encounter  Medications   lisinopril (ZESTRIL) 5 MG tablet    Sig: Take 1 tablet (5 mg total) by mouth 2 (two) times daily.    Patient Instructions  Medication Instructions:  - STOP lisinopril 2.5mg  - START lisinopril 5mg  twice daily  *If you need a refill on your cardiac medications before your next appointment, please call your pharmacy*  Lab Work: -BMET ordered today to be drawn in 1 week (03/15/23).  Please fax copy of results to [phone].  Testing/Procedures: - Echo has been ordered today. Please have this done before your appointment with Dr. Swaziland on 03/20/23 if possible.  Your physician has requested that you have an echocardiogram. Echocardiography is a painless test that uses sound waves to create images of your heart. It  provides your doctor with information about the size and shape of your heart and how well your heart's chambers and valves are working. This procedure takes approximately one hour. There are no restrictions for this procedure.  Please do NOT wear cologne, perfume, aftershave, or lotions (deodorant is allowed). Please arrive 15 minutes prior to your appointment time.    Follow-Up: At [facility], you and your health needs are our priority.  As part of our continuing mission to provide you with exceptional heart care, we have created designated Provider Care Teams.  These Care Teams include your primary Cardiologist (physician) and Advanced Practice Providers (APPs -  Physician Assistants and Nurse Practitioners) who all work together to provide you with the care you need, when you need it.  We recommend signing up for the patient portal called "MyChart".  Sign up information is provided on this After Visit Summary.  MyChart is used to connect with patients for Virtual Visits (Telemedicine).  Patients are able to view lab/test results, encounter notes, upcoming appointments, etc.  Non-urgent messages can be sent to your provider as well.   To learn more about what you can do with MyChart, go to [email or web address].    Your next appointment:   - Keep your appointment with Dr. Swaziland on 03/20/23 at 1:20pm.  Other Instructions - Keep a daily blood pressure log. Check your BP 2 hours after morning medications.    Signed, Marcelino Duster, [place]  03/08/2023 4:13 PM    [facility]

## 2023-03-08 NOTE — Telephone Encounter (Signed)
Appointment scheduled with Micah Flesher PA today at 2:45 pm.Advised to bring a list of all medications.

## 2023-03-08 NOTE — Patient Instructions (Signed)
Medication Instructions:  - STOP lisinopril 2.5mg  - START lisinopril 5mg  twice daily  *If you need a refill on your cardiac medications before your next appointment, please call your pharmacy*  Lab Work: -BMET ordered today to be drawn in 1 week (03/15/23).  Please fax copy of results to [phone].  Testing/Procedures: - Echo has been ordered today. Please have this done before your appointment with Dr. Swaziland on 03/20/23 if possible.  Your physician has requested that you have an echocardiogram. Echocardiography is a painless test that uses sound waves to create images of your heart. It provides your doctor with information about the size and shape of your heart and how well your heart's chambers and valves are working. This procedure takes approximately one hour. There are no restrictions for this procedure.  Please do NOT wear cologne, perfume, aftershave, or lotions (deodorant is allowed). Please arrive 15 minutes prior to your appointment time.    Follow-Up: At [facility], you and your health needs are our priority.  As part of our continuing mission to provide you with exceptional heart care, we have created designated Provider Care Teams.  These Care Teams include your primary Cardiologist (physician) and Advanced Practice Providers (APPs -  Physician Assistants and Nurse Practitioners) who all work together to provide you with the care you need, when you need it.  We recommend signing up for the patient portal called "MyChart".  Sign up information is provided on this After Visit Summary.  MyChart is used to connect with patients for Virtual Visits (Telemedicine).  Patients are able to view lab/test results, encounter notes, upcoming appointments, etc.  Non-urgent messages can be sent to your provider as well.   To learn more about what you can do with MyChart, go to [email or web address].    Your next appointment:   - Keep your appointment with Dr. Swaziland on 03/20/23 at  1:20pm.  Other Instructions - Keep a daily blood pressure log. Check your BP 2 hours after morning medications.

## 2023-03-13 ENCOUNTER — Ambulatory Visit (HOSPITAL_COMMUNITY): Payer: Medicare Other | Attending: Physician Assistant

## 2023-03-13 DIAGNOSIS — I10 Essential (primary) hypertension: Secondary | ICD-10-CM | POA: Diagnosis present

## 2023-03-13 DIAGNOSIS — Z952 Presence of prosthetic heart valve: Secondary | ICD-10-CM | POA: Insufficient documentation

## 2023-03-13 DIAGNOSIS — I480 Paroxysmal atrial fibrillation: Secondary | ICD-10-CM | POA: Diagnosis not present

## 2023-03-13 LAB — ECHOCARDIOGRAM COMPLETE
AR max vel: 1.81 cm2
AV Area VTI: 1.94 cm2
AV Area mean vel: 1.86 cm2
AV Mean grad: 17 mmHg
AV Peak grad: 31.6 mmHg
Ao pk vel: 2.81 m/s
Area-P 1/2: 2.99 cm2
S' Lateral: 2.5 cm

## 2023-03-14 ENCOUNTER — Encounter: Payer: Self-pay | Admitting: Cardiology

## 2023-03-15 ENCOUNTER — Ambulatory Visit: Payer: Medicare HMO | Admitting: Neurology

## 2023-03-15 LAB — BASIC METABOLIC PANEL
BUN: 17 (ref 4–21)
CO2: 27 — AB (ref 13–22)
Chloride: 96 — AB (ref 99–108)
Creatinine: 1.1 (ref 0.5–1.1)
Glucose: 86
Potassium: 4.3 mEq/L (ref 3.5–5.1)
Sodium: 131 — AB (ref 137–147)

## 2023-03-15 LAB — COMPREHENSIVE METABOLIC PANEL
Calcium: 9.6 (ref 8.7–10.7)
eGFR: 48

## 2023-03-16 NOTE — Progress Notes (Signed)
Cardiology Office Note:    Date:  03/16/2023   ID:  Barbara Mcconnell, DOB 02/04/1935, MRN [id number]  PCP:  Gillis Santa, NP  Cardiologist:  Leannah Guse [place], MD  Electrophysiologist:  None   Referring MD: Frederica Kuster, MD   No chief complaint on file.   History of Present Illness:    Barbara Mcconnell is a 88 y.o. female is seen for follow up aortic stenosis s/p TAVR and Takotsubo cardiomyopathy.  She had a cardiac event approximately 7 years ago in [facility], we have no records of that. According to the patient this was Takotsubos cardiomyopathy.   Other medical issues include hypertension, peripheral vascular disease with moderate carotid stenosis, and dyslipidemia with a history of statin intolerance.    The patient presented to the med center in [facility] 05/13/2019 with substernal chest pain and diaphoresis.  She ruled in for an MI.  Admitted to [facility] hospital. Catheterization was done which showed a 50% RCA otherwise normal coronaries and an ejection fraction of 30 to 35% with moderate AS. LV pattern was c/w Takotsubo's disease.  In the hospital she was somewhat hypotensive but this improved prior to discharge and she was put back on her lisinopril but at a lower dose. She is also on low dose Toprol.  On repeat Echo in March 2021 her EF had normalized but there was moderate to severe Aortic stenosis and repeat in 6 months was recommended. Unfortunately she did not have follow up until she presented to [facility] 07/21/21-07/30/21 for acute dyspnea. Echo showed normal LVEF with a moderate circumfrential pericardial effusion measuring 1.5 cm. There was moderate AS with a mean gradient at (was 20.26mmhg), AVA 1.38cm and DI 0.29. She underwent [facility] 07/23/21 which showed stable, mild to moderate CAD with hemodynamics consistent with pulmonary hypertension. Due to moderate AS, she was initially felt to be a good PROGRESS trial candidate however with discrepancies between echocardiogram findings  and inability to cross AV, pre-TAVR CT imaging was obtained. These showed an AVA 1.01cm2, annular area measuring 433mm2, and large circumferential pericardial effusion. After further discussion, she opted to defer PROGRESS trail consideration and move forward with TAVR given progressive symptoms, LVH with small LV cavity and calcium score at 2093. Her hospital stay was further complicated by new onset atrial fibrillation with RVR. She was placed on diltiazem infusion with relatively quick conversion to NSR. She ultimately underwent pericardiocentesis 07/28/21 and drain placement. This was discontinued 07/29/21 and the patient was discharged from the hospital 07/30/21. During her admission, she was evaluated by our multidisciplinary valve team and plans were made for TAVR on 08/03/21.    She underwent successful TAVR with a 23 mm Edwards Sapien 3 Resilia via the TF approach on 08/03/21. Post operative echo showed LVEF at 65-70% with G1DD, mild MR, mild MS, trivial PVL in the right coronary ostium with mild MR with a mean gradient at 10.41mmHg. ECG with NSR and frequent PVCs however no high grade heart block. She was initially started on ASA 81mg  QD however this was changed to Eliquis 2.5mg  BID after discharge due to AF on ZIO monitor.  She was admitted 9/1-03/30/22 with acute left MCA stroke. She had difficulty with word finding and confusion. She was on Eliquis. Had IR intervention of M2 segment with apparent reocclusion of M3 segment. Echo was stable. CT showed severe stenosis at the origin of the RICA. No significant stenosis on the left. (Prior dopplers in Feb showed 50-69% RICA stenosis). She was switched  from Eliquis to Pradaxa. DC to SNF.  While in hospital no Afib seen. She was initially at [facility] but has since been moved to [facility] assisted living. She did wear an Event monitor which showed some nonsustained atrial arrhythmias. She is seen with her daughter today.   She did fall in October. Was found to  be orthostatic so metoprolol was held. DC on very low dose lisinopril 1.25 mg daily. This was later increased to 2.5 mg daily.   She developed elevated blood pressure and was seen in the ER 03/07/2023.  SBP 170-200 range at home. Had been drifting up to 140-170 range.  BP on arrival 181/66.  EDP did not feel she needed emergent blood pressure lowering and discharged her without admission with close follow-up with cardiology. They increased her lisinopril from 2.5 mg to 5 mg. Of note, Na was also 128, treated with IVF. She was having diarrhea over a few days. Was seen in follow up by Micah Flesher on 8/14, lisinopril increased to 5 mg bid. Loud murmur noted and Echo ordered. This showed hyperdynamic LV function with gr 2 diastolic dysfunction. Normal TAVR function.    Past Medical History:  Diagnosis Date   Allergies    Anxiety    panic attacks   Aortic stenosis    Arthritis    Bronchiectasis with (acute) exacerbation (HCC)    CAD (coronary artery disease)    non obst CAD   Depression due to acute stroke (HCC)    Dizziness    Elevated liver enzymes    Fall    Fluid overload    GERD (gastroesophageal reflux disease)    Heart murmur    Hemorrhoids    History of mammogram    Hyperlipemia    Hypertension    Internal carotid artery stenosis    OCD (obsessive compulsive disorder)    Orthostatic hypotension    Other cirrhosis of liver (HCC)    PAF (paroxysmal atrial fibrillation) (HCC)    Pericardial effusion    Physical deconditioning    Renal insufficiency    Rheumatic heart failure (congestive) (HCC)    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 Lynnette Caffey & Dr. Laneta Simmers   Seasonal allergies    Severe aortic stenosis    Stroke [facility])    Takotsubo cardiomyopathy     Past Surgical History:  Procedure Laterality Date   COLONOSCOPY     EYE SURGERY Bilateral    cataracts removed   INTRAOPERATIVE  TRANSTHORACIC ECHOCARDIOGRAM N/A 08/03/2021   Procedure: INTRAOPERATIVE TRANSTHORACIC ECHOCARDIOGRAM;  Surgeon: Orbie Pyo, MD;  Location: [facility] INVASIVE CV LAB;  Service: Open Heart Surgery;  Laterality: N/A;   IR CT HEAD LTD  03/25/2022   IR PERCUTANEOUS ART THROMBECTOMY/INFUSION INTRACRANIAL INC DIAG ANGIO  03/25/2022   IR US GUIDE VASC ACCESS RIGHT  03/25/2022   PERICARDIOCENTESIS N/A 07/28/2021   Procedure: PERICARDIOCENTESIS;  Surgeon: Orbie Pyo, MD;  Location: [facility] INVASIVE CV LAB;  Service: Cardiovascular;  Laterality: N/A;   RADIOLOGY WITH ANESTHESIA N/A 03/25/2022   Procedure: IR WITH ANESTHESIA;  Surgeon: Radiologist, Medication, MD;  Location: [facility] OR;  Service: Radiology;  Laterality: N/A;   RIGHT/LEFT HEART CATH AND CORONARY ANGIOGRAPHY N/A 05/13/2019   Procedure: RIGHT/LEFT HEART CATH AND CORONARY ANGIOGRAPHY;  Surgeon: [place], Daveena Elmore M, MD;  Location: [facility] INVASIVE CV LAB;  Service: Cardiovascular;  Laterality: N/A;   RIGHT/LEFT HEART CATH AND  CORONARY ANGIOGRAPHY N/A 07/23/2021   Procedure: RIGHT/LEFT HEART CATH AND CORONARY ANGIOGRAPHY;  Surgeon: Corky Crafts, MD;  Location: [facility] INVASIVE CV LAB;  Service: Cardiovascular;  Laterality: N/A;   TONSILLECTOMY     TRANSCATHETER AORTIC VALVE REPLACEMENT, TRANSFEMORAL N/A 08/03/2021   Procedure: TRANSCATHETER AORTIC VALVE REPLACEMENT, TRANSFEMORAL;  Surgeon: Orbie Pyo, MD;  Location: [facility] INVASIVE CV LAB;  Service: Open Heart Surgery;  Laterality: N/A;   UPPER GI ENDOSCOPY      Current Medications: No outpatient medications have been marked as taking for the 03/20/23 encounter (Appointment) with [place], Merion Caton M, MD.     Allergies:   Codeine, Cyclobenzaprine, Iodinated contrast media, and Prednisone   Social History   Socioeconomic History   Marital status: Divorced    Spouse name: Not on file   Number of children: Not on file   Years of education: Not on file   Highest education level: 12th grade  Occupational History    Not on file  Tobacco Use   Smoking status: Former    Current packs/day: 1.00    Average packs/day: 1 pack/day for 20.0 years (20.0 ttl pk-yrs)    Types: Cigarettes   Smokeless tobacco: Never   Tobacco comments:    Quit 30 yrs ago as of 07/2021 per daughter  Vaping Use   Vaping status: Never Used  Substance and Sexual Activity   Alcohol use: Yes    Comment: occasionally wine   Drug use: Never   Sexual activity: Not Currently    Birth control/protection: Post-menopausal  Other Topics Concern   Not on file  Social History Narrative   Tobacco use, amount per day now: None   Past tobacco use, amount per day: Up to 1 pack   How many years did you use tobacco: 20 years.   Alcohol use (drinks per week): None.   Diet: 3 meals daily/mostly cooked at home.   Do you drink/eat things with caffeine: periodic diet coke.   Marital status:  Divorced.                                What year were you married? 1968   Do you live in a house, apartment, assisted living, condo, trailer, etc.? One Level Townhome   Is it one or more stories? One Level   How many persons live in your home? One   Do you have pets in your home?( please list) No   Highest Level of education completed? High School   Current or past profession: Immunologist.    Do you exercise?   Yes when able.                               Type and how often? Silver Sneakers 2-3 times weekly.   Do you have a living will? Yes   Do you have a DNR form?        No                           If not, do you want to discuss one? Yes   Do you have signed POA/HPOA forms?   Yes                     If so, please bring to you appointment  Do you have any difficulty bathing or dressing yourself? Yes   Do you have any difficulty preparing food or eating? Yes   Do you have any difficulty managing your medications? Yes   Do you have any difficulty managing your finances? No   Do you have any difficulty affording your medications?  No   Social Determinants of Health   Financial Resource Strain: Medium Risk (11/07/2022)   Overall Financial Resource Strain (CARDIA)    Difficulty of Paying Living Expenses: Somewhat hard  Food Insecurity: No Food Insecurity (11/07/2022)   Hunger Vital Sign    Worried About Running Out of Food in the Last Year: Never true    Ran Out of Food in the Last Year: Never true  Transportation Needs: No Transportation Needs (11/07/2022)   PRAPARE - Administrator, Civil Service (Medical): No    Lack of Transportation (Non-Medical): No  Physical Activity: Unknown (11/07/2022)   Exercise Vital Sign    Days of Exercise per Week: 0 days    Minutes of Exercise per Session: Not on file  Stress: Stress Concern Present (11/07/2022)   [employer] of Occupational Health - Occupational Stress Questionnaire    Feeling of Stress : Very much  Social Connections: Socially Isolated (11/07/2022)   Social Connection and Isolation Panel [NHANES]    Frequency of Communication with Friends and Family: Three times a week    Frequency of Social Gatherings with Friends and Family: Patient declined    Attends Religious Services: Never    Database administrator or Organizations: No    Attends Engineer, structural: Not on file    Marital Status: Divorced     Family History: The patient's family history includes Alcohol abuse in her sister; Brain cancer in her brother; Cancer in her brother and sister; Diabetes in her brother; Down syndrome in her brother; Endometriosis in her daughter; Heart Problems in her brother; Heart failure in her brother and brother; Hypertension in an other family member; Lung cancer in her sister; Melanoma in her brother.  ROS:   Please see the history of present illness.     All other systems reviewed and are negative.  EKGs/Labs/Other Studies Reviewed:     EKG:  EKG is not ordered today.    Recent Labs: 05/10/2022: TSH 0.623 05/13/2022: Magnesium  2.1 11/09/2022: ALT 22 03/07/2023: BUN 15; Creatinine, Ser 0.94; Hemoglobin 9.9; Platelets 260; Potassium 4.0; Sodium 128  Recent Lipid Panel    Component Value Date/Time   CHOL 204 (H) 03/26/2022 0137   TRIG 77 03/26/2022 0137   HDL 86 03/26/2022 0137   CHOLHDL 2.4 03/26/2022 0137   VLDL 15 03/26/2022 0137   LDLCALC 103 (H) 03/26/2022 0137   LDLCALC 95 02/15/2022 1119    Dated 02/15/23: creatinine 1.07. otherwise CMET normal. Hgb 10.1  Physical Exam:    VS:  There were no vitals taken for this visit.    Wt Readings from Last 3 Encounters:  03/08/23 113 lb (51.3 kg)  03/07/23 113 lb (51.3 kg)  02/15/23 113 lb (51.3 kg)     GEN: Thin caucasian female, well developed in no acute distress HEENT: Normal NECK: No JVD LYMPHATICS: No lymphadenopathy CARDIAC: RRR, gr 2/6 diastolic murmur LSB, no rubs, gallops RESPIRATORY:  Clear to auscultation without rales, wheezing or rhonchi  ABDOMEN: Soft, non-tender, non-distended MUSCULOSKELETAL:  No edema; No deformity  SKIN: Warm and dry NEUROLOGIC:  Alert and oriented x 3. Some word finding difficulty.  PSYCHIATRIC:  Normal affect   Cardiac studies:  Echo 08/04/21:    1. Left ventricular ejection fraction, by estimation, is 65 to 70%. The  left ventricle has normal function. The left ventricle has no regional  wall motion abnormalities. There is mild left ventricular hypertrophy of  the basal-septal segment. Left  ventricular diastolic parameters are consistent with Grade I diastolic  dysfunction (impaired relaxation). Elevated left atrial pressure.   2. Right ventricular systolic function is normal. The right ventricular  size is normal. Tricuspid regurgitation signal is inadequate for assessing  PA pressure.   3. Left atrial size was mildly dilated.   4. The mitral valve is degenerative. Mild mitral valve regurgitation.  Mild mitral stenosis. The mean mitral valve gradient is 7.0 mmHg with  average heart rate of 93 bpm. Severe  mitral annular calcification.   5. There is trivial perivalvular leak in the vicinity of the right  coronary ostium. The aortic valve has been repaired/replaced. Aortic valve  regurgitation is mild. There is a 23 mm Sapien prosthetic (TAVR) valve  present in the aortic position.  Procedure Date: 08/03/21. Aortic valve mean gradient measures 10.2 mmHg.  Aortic valve Vmax measures 2.22 m/s. Aortic valve acceleration time  measures 58 msec.   6. The inferior vena cava is dilated in size with <50% respiratory  variability, suggesting right atrial pressure of 15 mmHg.    ______________________   Echo 09/10/21 IMPRESSIONS  1. 23 mm S3 is present in the aortic position. There is abnormal color  flow in the region of the prosthesis, but this is forward flow based on  color doppler. There is no apparenent regurgitation or paravalvular leak.  Possibly, this represents flow  between the stent frame and aortic root. There is also a small color  signal below the annulus in the 8-10 o'clock position. Cannot exclude a  very small iatrogenic VSD. Vmax 2.1 m/s, MG 9.5 mmHG, EOA 2.42 cm2.  Hemodynamics are within limits. Would  consider TEE for characterization if clinically indicated. The aortic  valve has been repaired/replaced. Aortic valve regurgitation is not  visualized. There is a 23 mm Sapien prosthetic (TAVR) valve present in the  aortic position. Procedure Date:  08/03/2021.   2. Left ventricular ejection fraction, by estimation, is >75%. The left  ventricle has hyperdynamic function. The left ventricle has no regional  wall motion abnormalities. There is moderate concentric left ventricular  hypertrophy. Left ventricular  diastolic function could not be evaluated.   3. Right ventricular systolic function is normal. The right ventricular  size is normal. There is normal pulmonary artery systolic pressure. The  estimated right ventricular systolic pressure is 26.0 mmHg.   4. Left atrial size  was mildly dilated.   5. The mitral valve is degenerative. No evidence of mitral valve  regurgitation. Moderate mitral annular calcification.   6. The inferior vena cava is normal in size with greater than 50%  respiratory variability, suggesting right atrial pressure of 3 mmHg.   Comparison(s): No significant change from prior study. AoV gradients  stable. Color jet described above that is abnormal.     Corky Crafts, MD (Primary)     Procedures  RIGHT/LEFT HEART CATH AND CORONARY ANGIOGRAPHY   Conclusion      Prox RCA lesion is 50% stenosed.   Prox Cx to Mid Cx lesion is 25% stenosed.   Prox LAD to Mid LAD lesion is 25% stenosed.   Hemodynamic findings consistent with pulmonary hypertension.   Stable, mild to  moderate coronary artery disease.  No indication for PCI.   Unable to cross aortic valve, consistent with worsening noted on echocardiogram.  Continue plans for TAVR w/u.   Echo 03/26/22: IMPRESSIONS     1. Left ventricular ejection fraction, by estimation, is 65 to 70%. The  left ventricle has normal function. The left ventricle has no regional  wall motion abnormalities. There is moderate left ventricular hypertrophy.  Left ventricular diastolic  parameters are indeterminate.   2. Right ventricular systolic function is normal. The right ventricular  size is normal. Tricuspid regurgitation signal is inadequate for assessing  PA pressure.   3. Left atrial size was moderately dilated.   4. The mitral valve is degenerative. Trivial mitral valve regurgitation.  Mild mitral stenosis. The mean mitral valve gradient is 5.0 mmHg with  average heart rate of 68 bpm. Severe mitral annular calcification.   5. The abnormal color flow signal seen previously below the prosthetic  aortic valve is seen again on this exam, color flow in multiple views  suggestive of valvular regurgitation, though it cannot be demonstrated  with spectral Doppler, which would  support color  flow artifact, possibly from stent frame of valve. No  evidence of prosthetic valve obstruction or significant regurgitation.      The aortic valve has been repaired/replaced. Aortic valve  regurgitation is mild. There is a 23 mm Sapien prosthetic (TAVR) valve  present in the aortic position. Procedure Date: 08/03/21. Echo findings are  consistent with likely stable structure and  function of the aortic valve prosthesis. Aortic valve area, by VTI  measures 2.58 cm. Aortic valve mean gradient measures 11.0 mmHg. Aortic  valve Vmax measures 1.98 m/s. Aortic valve acceleration time measures 85  msec.   6. The inferior vena cava is normal in size with greater than 50%  respiratory variability, suggesting right atrial pressure of 3 mmHg.    Event monitor 06/07/22: Study Highlights      Nromal sinus rhythm   Rare PVCs with bigeminy   Occasional PACs with short runs of SVT   On October 27 there were 2 episoded of SVT c/w atrial flutter at 2:48 and 5:42 pm. longest lasting 8 minutes and 24 seconds.     Patch Wear Time:  13 days and 21 hours (2023-10-20T13:40:10-398 to 2023-11-03T11:06:28-0400)   Patient had a min HR of 61 bpm, max HR of 211 bpm, and avg HR of 90 bpm. Predominant underlying rhythm was Sinus Rhythm. QRS morphology changes were present throughout recording. 53 Supraventricular Tachycardia runs occurred, the run with the fastest  interval lasting 6 beats with a max rate of 211 bpm, the longest lasting 8 mins 24 secs with an avg rate of 158 bpm. Some episodes of Supraventricular Tachycardia may be possible Atrial Tachycardia with variable block. Isolated SVEs were occasional  (1.6%, 28180), SVE Couplets were rare (<1.0%, 955), and SVE Triplets were rare (<1.0%, 127). Isolated VEs were rare (<1.0%, 12422), VE Couplets were rare (<1.0%, 213), and VE Triplets were rare (<1.0%, 63). Ventricular Bigeminy and Trigeminy were   Echo 08/10/22: IMPRESSIONS     1. On image 60, there is high  velocity color flow seen extending into the  LV septum. Cannot exclude VSD. Left ventricular ejection fraction, by  estimation, is 65 to 70%. The left ventricle has normal function. The left  ventricle has no regional wall  motion abnormalities. There is severe asymmetric left ventricular  hypertrophy of the septal segment. Left ventricular diastolic parameters  are consistent  with Grade I diastolic dysfunction (impaired relaxation).   2. Right ventricular systolic function is normal. The right ventricular  size is normal. There is normal pulmonary artery systolic pressure.   3. Left atrial size was moderately dilated.   4. The mitral valve is degenerative. Trivial mitral valve regurgitation.  Mild mitral stenosis. Severe mitral annular calcification.   5. Tricuspid valve regurgitation is mild to moderate.   6. The aortic valve has been repaired/replaced. Aortic valve  regurgitation is trivial. There is a 23 mm Sapien prosthetic (TAVR) valve  present in the aortic position. Procedure Date: 08/03/2021. Aortic  regurgitation PHT measures 199 msec. Aortic valve  area, by VTI measures 3.18 cm. Aortic valve mean gradient measures 14.3  mmHg. Aortic valve Vmax measures 2.43 m/s.   7. The inferior vena cava is normal in size with greater than 50%  respiratory variability, suggesting right atrial pressure of 3 mmHg.   Comparison(s): Prior images reviewed side by side.   Echo 03/13/23: IMPRESSIONS     1. Left ventricular ejection fraction, by estimation, is 70 to 75%. The  left ventricle has hyperdynamic function. The left ventricle has no  regional wall motion abnormalities. There is severe left ventricular  hypertrophy. Left ventricular diastolic  parameters are consistent with Grade II diastolic dysfunction  (pseudonormalization). Elevated left atrial pressure.   2. Right ventricular systolic function is normal. The right ventricular  size is normal. There is normal pulmonary artery  systolic pressure. The  estimated right ventricular systolic pressure is 33.0 mmHg.   3. Left atrial size was mildly dilated.   4. The mitral valve is degenerative. Trivial mitral valve regurgitation.  Mild mitral stenosis. The mean mitral valve gradient is 4.0 mmHg with  average heart rate of 77 bpm. Severe mitral annular calcification.   5. The aortic valve has been repaired/replaced. Aortic valve  regurgitation is mild. There is a 23 mm Edwards Sapien prosthetic (TAVR)  valve present in the aortic position. Procedure Date: 08/03/2021. Mild PVL.  Vmax 2.8 m/s, MG , EOA 1.9 cm^2, DI  0.62.   6. Aortic dilatation noted. There is dilatation of the ascending aorta,  measuring 40 mm.   7. The inferior vena cava is normal in size with greater than 50%  respiratory variability, suggesting right atrial pressure of 3 mmHg.   ASSESSMENT:     Severe AS s/p TAVR: echo recently showed hyperdynamic LV function. Prosthesis is functioning well.  mild AI.  No change. She has Amoxicillin for SBE prophylaxis. Continue anticoagulation.      2.  Left MCA stroke. Was on Eliquis - now switched to Pradaxa.  Prior Afib burden low. No rate control necessary  3.  Hx of pericardial effusion: s/p pericardial drainage. no recurrent effusion on echo   4. Paroxysmal fibrillation: continue Pradaxa given CVA.    5. Carotid artery disease: pre TAVR head/neck CTA with moderate right carotid artery stenosis with plans for surveillance dopplers in the OP setting. She is followed by [facility]- Dr Karin Lieu. Dopplers in Feb showed moderate RICA disease. More recent CT angio suggests severe stenosis at origin of RICA similar to prior CT. Follow up with [facility]   5. s/p Non-ST elevation (NSTEMI) myocardial infarction (HCC) due to demand ischemia in setting of stress induced CM in 2020 05/13/2019- HS Troponin peak 879. With decreased EF and no significant CAD strongly suspect this is related to recurrent Takotsubo cardiomyopathy  6.  Takotsubo cardiomyopathy H/O remote Takotsubo event 5 years ago-Recurrent in October 2020  EF 30-35% by echo 05/13/2019 when she presented with second event - now with normal LV function.  7. Essential hypertension BP is staying high despite increase in lisinopril. No on 5 mg bid. Will add low dose Bystolic 2.5 mg daily. Move cautiously given prior history of low BP.   8. Dyslipidemia, goal LDL below 70 On Crestor  9. Chronic diastolic dysfunction. Appears euvolemic. Will reduce lasix to 10 mg daily given hyponatremia. Monitor weight and swelling.   Follow up in 3 months.  Medication Adjustments/Labs and Tests Ordered: Current medicines are reviewed at length with the patient today.  Concerns regarding medicines are outlined above.  No orders of the defined types were placed in this encounter.  No orders of the defined types were placed in this encounter.   There are no Patient Instructions on file for this visit.   Signed, Lennan Malone Swaz[place], MD  03/16/2023 10:02 AM    [facility]

## 2023-03-17 ENCOUNTER — Encounter: Payer: Self-pay | Admitting: Cardiology

## 2023-03-20 ENCOUNTER — Ambulatory Visit: Payer: Medicare Other | Attending: Cardiology | Admitting: Cardiology

## 2023-03-20 ENCOUNTER — Encounter: Payer: Self-pay | Admitting: Cardiology

## 2023-03-20 VITALS — BP 164/60 | HR 87 | Ht 61.0 in | Wt 112.6 lb

## 2023-03-20 DIAGNOSIS — Z952 Presence of prosthetic heart valve: Secondary | ICD-10-CM | POA: Insufficient documentation

## 2023-03-20 DIAGNOSIS — I10 Essential (primary) hypertension: Secondary | ICD-10-CM | POA: Diagnosis present

## 2023-03-20 DIAGNOSIS — I5032 Chronic diastolic (congestive) heart failure: Secondary | ICD-10-CM | POA: Diagnosis present

## 2023-03-20 DIAGNOSIS — I480 Paroxysmal atrial fibrillation: Secondary | ICD-10-CM | POA: Insufficient documentation

## 2023-03-20 MED ORDER — NEBIVOLOL HCL 2.5 MG PO TABS: 2.5000 mg | ORAL_TABLET | Freq: Every day | ORAL | 3 refills | Status: DC

## 2023-03-20 NOTE — Patient Instructions (Addendum)
Medication Instructions:  Your physician has recommended you make the following change in your medication:   - Start Lasix 10 mg, once daily  - Nebivolol (Bystolic) 2.5mg , once daily   *If you need a refill on your cardiac medications before your next appointment, please call your pharmacy*    Follow-Up: At [facility], you and your health needs are our priority.  As part of our continuing mission to provide you with exceptional heart care, we have created designated Provider Care Teams.  These Care Teams include your primary Cardiologist (physician) and Advanced Practice Providers (APPs -  Physician Assistants and Nurse Practitioners) who all work together to provide you with the care you need, when you need it.  We recommend signing up for the patient portal called "MyChart".  Sign up information is provided on this After Visit Summary.  MyChart is used to connect with patients for Virtual Visits (Telemedicine).  Patients are able to view lab/test results, encounter notes, upcoming appointments, etc.  Non-urgent messages can be sent to your provider as well.   To learn more about what you can do with MyChart, go to [email or web address].    Your next appointment:   3 month(s)  Provider:   Peter Swaziland, MD

## 2023-04-03 ENCOUNTER — Other Ambulatory Visit: Payer: Self-pay | Admitting: Family Medicine

## 2023-04-03 DIAGNOSIS — R0982 Postnasal drip: Secondary | ICD-10-CM

## 2023-04-03 DIAGNOSIS — F339 Major depressive disorder, recurrent, unspecified: Secondary | ICD-10-CM

## 2023-04-03 NOTE — Telephone Encounter (Signed)
Patient medication has High Risk Warnings. I cannot send medication in. Medication pend and sent to PCP Venita Sheffield, MD for approval.

## 2023-04-07 ENCOUNTER — Other Ambulatory Visit (HOSPITAL_BASED_OUTPATIENT_CLINIC_OR_DEPARTMENT_OTHER): Payer: Self-pay

## 2023-04-07 MED ORDER — COMIRNATY 30 MCG/0.3ML IM SUSY
0.3000 mL | PREFILLED_SYRINGE | Freq: Once | INTRAMUSCULAR | 0 refills | Status: AC
  Filled 2023-04-07: qty 0.3, 1d supply, fill #0

## 2023-04-10 ENCOUNTER — Other Ambulatory Visit: Payer: Self-pay | Admitting: Cardiology

## 2023-04-10 ENCOUNTER — Encounter: Payer: Self-pay | Admitting: Cardiology

## 2023-04-10 DIAGNOSIS — I10 Essential (primary) hypertension: Secondary | ICD-10-CM

## 2023-04-11 NOTE — Telephone Encounter (Signed)
Spoke to patient's daughter [name] she stated mother's medications were started back yesterday 9/16.Stated she found out she had missed 4 different medications.Stated she is concerned mother is not getting good care at [facility].Stated mother has appointment with a new Dr.at [facility] next Tue 9/24.Advised to call back if she needs a sooner appointment with Dr.Jordan.

## 2023-04-12 ENCOUNTER — Other Ambulatory Visit: Payer: Self-pay | Admitting: Cardiology

## 2023-04-13 ENCOUNTER — Encounter: Payer: Self-pay | Admitting: Sports Medicine

## 2023-04-13 NOTE — Telephone Encounter (Signed)
Message routed to PCP Venita Sheffield, MD

## 2023-04-18 ENCOUNTER — Encounter: Payer: Self-pay | Admitting: Sports Medicine

## 2023-04-18 ENCOUNTER — Ambulatory Visit (INDEPENDENT_AMBULATORY_CARE_PROVIDER_SITE_OTHER): Payer: Medicare Other | Admitting: Sports Medicine

## 2023-04-18 VITALS — BP 120/84 | HR 68 | Temp 97.4°F | Resp 16 | Ht 61.0 in | Wt 114.0 lb

## 2023-04-18 DIAGNOSIS — R4689 Other symptoms and signs involving appearance and behavior: Secondary | ICD-10-CM

## 2023-04-18 DIAGNOSIS — R4189 Other symptoms and signs involving cognitive functions and awareness: Secondary | ICD-10-CM

## 2023-04-18 DIAGNOSIS — E871 Hypo-osmolality and hyponatremia: Secondary | ICD-10-CM

## 2023-04-18 DIAGNOSIS — I10 Essential (primary) hypertension: Secondary | ICD-10-CM

## 2023-04-18 DIAGNOSIS — F339 Major depressive disorder, recurrent, unspecified: Secondary | ICD-10-CM

## 2023-04-18 DIAGNOSIS — Z23 Encounter for immunization: Secondary | ICD-10-CM | POA: Diagnosis not present

## 2023-04-18 DIAGNOSIS — D509 Iron deficiency anemia, unspecified: Secondary | ICD-10-CM

## 2023-04-18 MED ORDER — LISINOPRIL 10 MG PO TABS: 10.0000 mg | ORAL_TABLET | Freq: Two times a day (BID) | ORAL | 3 refills | Status: DC

## 2023-04-18 NOTE — Progress Notes (Signed)
Careteam: Patient Care Team: Venita Sheffield, MD as PCP - General (Internal Medicine) Sw[place], Peter M, MD as PCP - Cardiology (Cardiology) Marisue Brooklyn, MD as Referring Physician  PLACE OF SERVICE:  [facility]  Advanced Directive information Does Patient Have a Medical Advance Directive?: Yes, Type of Advance Directive: Out of facility DNR (pink MOST or yellow form);Living will;Healthcare Power of Attorney, Pre-existing out of facility DNR order (yellow form or pink MOST form): Yellow form placed in chart (order not valid for inpatient use), Does patient want to make changes to medical advance directive?: No - Patient declined  Allergies  Allergen Reactions   Codeine Nausea And Vomiting   Cyclobenzaprine Other (See Comments)    Unknown reaction   Iodinated Contrast Media Nausea Only   Prednisone Other (See Comments)    "Sores in the mouth"    Chief Complaint  Patient presents with   Follow-up    Follow up lab work and BP   Immunizations    Influenza   Quality Metric Gaps    Dexa Scan     HPI: Patient is a 88 y.o. female  with PMH of severe AS s/p TAVR, left MCA stroke, h/o pericardial effusion, Paroxysmal Afib, CAD, HTN, Diastolic dysfunction, dementia, IDA , Depression is here for follow up  Pt lives at [facility] [place]  Pt is here for follow up  Accompanied by her daughter     HTN - saw cardiology , lisinopril was increased to lisinopril 5 mg bid and started on bystolic Daughter states that her mom's blood pressure is running high   CHF -  changed lasix to 10 mg , bystolic  was started by her cardiology at her last visit with them ECHO 02/2023  EF 70- 75%  Anemia-     Latest Ref Rng & Units 03/07/2023    7:22 PM 02/15/2023    4:14 PM 11/09/2022    4:44 PM  CBC  WBC 4.0 - 10.5 K/uL 7.3  6.9  8.8   Hemoglobin 12.0 - 15.0 g/dL 9.9  16.1  9.1   Hematocrit 36.0 - 46.0 % 31.1  31.7  29.0   Platelets 150 - 400 K/uL 260  278  264    No signs of  bleeding    Hyponatremia    Latest Ref Rng & Units 03/15/2023   12:00 AM 03/07/2023    7:22 PM 02/15/2023    4:14 PM  BMP  Glucose 70 - 99 mg/dL  97  95   BUN 4 - 21 17     15  21    Creatinine 0.5 - 1.1 1.1     0.94  1.07   BUN/Creat Ratio 6 - 22 (calc)   20   Sodium 137 - 147 131     128  139   Potassium 3.5 - 5.1 mEq/L 4.3     4.0  4.1   Chloride 99 - 108 96     95  103   CO2 13 - 22 27     25  29    Calcium 8.7 - 10.7 9.6     9.1  9.5      This result is from an external source.       Review of Systems:  Review of Systems  Constitutional:  Negative for chills and fever.  HENT:  Negative for congestion and sore throat.   Eyes:  Negative for double vision.  Respiratory:  Negative for cough, sputum production and  shortness of breath (exertional, chronic, no recent change).   Cardiovascular:  Negative for chest pain, palpitations and leg swelling.  Gastrointestinal:  Negative for abdominal pain, heartburn and nausea.  Genitourinary:  Negative for dysuria, frequency and hematuria.  Musculoskeletal:  Negative for falls and myalgias.  Neurological:  Negative for dizziness, sensory change and focal weakness.     Past Medical History:  Diagnosis Date   Allergies    Anxiety    panic attacks   Aortic stenosis    Arthritis    Bronchiectasis with (acute) exacerbation (HCC)    CAD (coronary artery disease)    non obst CAD   Depression due to acute stroke (HCC)    Dizziness    Elevated liver enzymes    Fall    Fluid overload    GERD (gastroesophageal reflux disease)    Heart murmur    Hemorrhoids    History of mammogram    Hyperlipemia    Hypertension    Internal carotid artery stenosis    OCD (obsessive compulsive disorder)    Orthostatic hypotension    Other cirrhosis of liver (HCC)    PAF (paroxysmal atrial fibrillation) (HCC)    Pericardial effusion    Physical deconditioning    Renal insufficiency    Rheumatic heart failure (congestive) (HCC)    S/P TAVR  (transcatheter aortic valve replacement) 08/03/2021   s/p TAVR with a 23 mm Edwards S3UR via the TF approach by Dr. Lynnette Caffey & Dr. Laneta Simmers   Seasonal allergies    Severe aortic stenosis    Stroke [facility])    Takotsubo cardiomyopathy    Past Surgical History:  Procedure Laterality Date   COLONOSCOPY     EYE SURGERY Bilateral    cataracts removed   INTRAOPERATIVE TRANSTHORACIC ECHOCARDIOGRAM N/A 08/03/2021   Procedure: INTRAOPERATIVE TRANSTHORACIC ECHOCARDIOGRAM;  Surgeon: Orbie Pyo, MD;  Location: [facility] INVASIVE CV LAB;  Service: Open Heart Surgery;  Laterality: N/A;   IR CT HEAD LTD  03/25/2022   IR PERCUTANEOUS ART THROMBECTOMY/INFUSION INTRACRANIAL INC DIAG ANGIO  03/25/2022   IR US GUIDE VASC ACCESS RIGHT  03/25/2022   PERICARDIOCENTESIS N/A 07/28/2021   Procedure: PERICARDIOCENTESIS;  Surgeon: Orbie Pyo, MD;  Location: [facility] INVASIVE CV LAB;  Service: Cardiovascular;  Laterality: N/A;   RADIOLOGY WITH ANESTHESIA N/A 03/25/2022   Procedure: IR WITH ANESTHESIA;  Surgeon: Radiologist, Medication, MD;  Location: [facility] OR;  Service: Radiology;  Laterality: N/A;   RIGHT/LEFT HEART CATH AND CORONARY ANGIOGRAPHY N/A 05/13/2019   Procedure: RIGHT/LEFT HEART CATH AND CORONARY ANGIOGRAPHY;  Surgeon: Swaziland, Peter M, MD;  Location: [facility] INVASIVE CV LAB;  Service: Cardiovascular;  Laterality: N/A;   RIGHT/LEFT HEART CATH AND CORONARY ANGIOGRAPHY N/A 07/23/2021   Procedure: RIGHT/LEFT HEART CATH AND CORONARY ANGIOGRAPHY;  Surgeon: Corky Crafts, MD;  Location: [facility] INVASIVE CV LAB;  Service: Cardiovascular;  Laterality: N/A;   TONSILLECTOMY     TRANSCATHETER AORTIC VALVE REPLACEMENT, TRANSFEMORAL N/A 08/03/2021   Procedure: TRANSCATHETER AORTIC VALVE REPLACEMENT, TRANSFEMORAL;  Surgeon: Orbie Pyo, MD;  Location: [facility] INVASIVE CV LAB;  Service: Open Heart Surgery;  Laterality: N/A;   UPPER GI ENDOSCOPY     Social History:   reports that she has quit smoking. Her smoking use included cigarettes.  She has a 20 pack-year smoking history. She has never used smokeless tobacco. She reports current alcohol use. She reports that she does not use drugs.  Family History  Problem Relation Age of Onset   Cancer Sister  Lung cancer Sister    Alcohol abuse Sister    Melanoma Brother    Brain cancer Brother    Heart failure Brother    Diabetes Brother    Cancer Brother    Heart Problems Brother    Down syndrome Brother    Heart failure Brother    Endometriosis Daughter    Hypertension Other     Medications: Patient's Medications  New Prescriptions   No medications on file  Previous Medications   ACETAMINOPHEN (TYLENOL) 500 MG TABLET    Take 500-1,000 mg by mouth every 6 (six) hours as needed for moderate pain.   ACID REDUCER 10 MG TABLET    TAKE (1) TABLET BY MOUTH TWICE DAILY FOR GERD.   ALBUTEROL (VENTOLIN HFA) 108 (90 BASE) MCG/ACT INHALER    Inhale 2 puffs into the lungs every 4 (four) hours as needed for wheezing or shortness of breath (seasonal allergies).   DABIGATRAN (PRADAXA) 150 MG CAPS CAPSULE    TAKE (1) CAPSULE BY MOUTH TWICE DAILY.   DOCUSATE SODIUM (COLACE) 100 MG CAPSULE    TAKE (1) CAPSULE BY MOUTH ONCE DAILY FOR CONSTIPATION.   ESCITALOPRAM (LEXAPRO) 10 MG TABLET    TAKE (1) TABLET BY MOUTH ONCE DAILY.   FERROUS SULFATE (FEROSUL) 325 (65 FE) MG TABLET    TAKE (1) TABLET BY MOUTH THREE TIMES WEEKLY. **MON, WED, FRI**   FEXOFENADINE (ALLEGRA) 60 MG TABLET    TAKE (1) TABLET BY MOUTH ONCE EVERY MORNING.   FUROSEMIDE (LASIX) 20 MG TABLET    TAKE (1/2) TABLET (10MG ) BY MOUTH ONCE DAILY IN THE MORNING.   LISINOPRIL (ZESTRIL) 5 MG TABLET    TAKE (1) TABLET BY MOUTH TWICE DAILY.   MONTELUKAST (SINGULAIR) 10 MG TABLET    Take 1 tablet (10 mg total) by mouth daily.   MULTIPLE VITAMINS-MINERALS (EMERGEN-C VITAMIN C PO)    Take 1 packet by mouth daily. Mix with water   NEBIVOLOL (BYSTOLIC) 2.5 MG TABLET    Take 1 tablet (2.5 mg total) by mouth daily.   OXYMETAZOLINE (AFRIN  NASAL SPRAY) 0.05 % NASAL SPRAY    Place 2 sprays into both nostrils 2 (two) times daily as needed. 2 sprays BID prn nose nose bleed   ROSUVASTATIN (CRESTOR) 20 MG TABLET    TAKE (1) TABLET BY MOUTH AT BEDTIME.   TROLAMINE SALICYLATE (ASPERCREME EX)    Apply 1 application topically daily as needed (knee pain).  Modified Medications   No medications on file  Discontinued Medications   No medications on file    Physical Exam:  Vitals:   04/18/23 1222  Height: 5\' 1"  (1.549 m)   Body mass index is 21.28 kg/m. Wt Readings from Last 3 Encounters:  03/20/23 112 lb 9.6 oz (51.1 kg)  03/08/23 113 lb (51.3 kg)  03/07/23 113 lb (51.3 kg)    Physical Exam Constitutional:      Appearance: Normal appearance.  HENT:     Head: Normocephalic and atraumatic.  Cardiovascular:     Rate and Rhythm: Normal rate and regular rhythm.     Heart sounds: No murmur heard. Pulmonary:     Effort: Pulmonary effort is normal. No respiratory distress.     Breath sounds: Normal breath sounds. No wheezing.  Abdominal:     General: Bowel sounds are normal. There is no distension.     Tenderness: There is no abdominal tenderness. There is no guarding or rebound.  Musculoskeletal:  General: No swelling or tenderness.  Skin:    General: Skin is dry.  Neurological:     Mental Status: She is alert. Mental status is at baseline.     Sensory: No sensory deficit.     Motor: No weakness.     Labs reviewed: Basic Metabolic Panel: Recent Labs    05/10/22 1532 05/11/22 0958 05/13/22 0358 05/19/22 1517 11/09/22 1644 02/15/23 1614 03/07/23 1922 03/15/23 0000  NA 133*   < > 134*   < > 140 139 128* 131*  K 4.1   < > 3.5   < > 4.0 4.1 4.0 4.3  CL 98   < > 100   < > 103 103 95* 96*  CO2 28   < > 25   < > 29 29 25  27*  GLUCOSE 92   < > 103*   < > 99 95 97  --   BUN 19   < > 15   < > 25 21 15 17   CREATININE 0.96   < > 1.02*   < > 1.23* 1.07* 0.94 1.1  CALCIUM 10.0   < > 9.1   < > 9.4 9.5 9.1 9.6  MG   --   --  2.1  --   --   --   --   --   TSH 0.623  --   --   --   --   --   --   --    < > = values in this interval not displayed.   Liver Function Tests: Recent Labs    05/10/22 1532 05/11/22 0958 05/13/22 0358 05/19/22 1517 08/08/22 1413 11/09/22 1644  AST 53* 50* 38 48* 45* 53*  ALT 30 26 22 23 19 22   ALKPHOS 53 47 41  --   --   --   BILITOT 1.5* 1.7* 1.3* 1.2 1.0 1.1  PROT 7.7 6.1* 5.6* 6.4 6.3 6.7  ALBUMIN 4.3 3.3* 3.1*  --   --   --    No results for input(s): "LIPASE", "AMYLASE" in the last 8760 hours. Recent Labs    05/10/22 1532 05/19/22 1517  AMMONIA 19 <10   CBC: Recent Labs    05/10/22 1532 05/12/22 0311 05/19/22 1517 08/08/22 1413 11/09/22 1644 02/15/23 1614 03/07/23 1922  WBC 8.0   < > 6.1 6.3 8.8 6.9 7.3  NEUTROABS 5.7  --  3,129 3,522  --   --   --   HGB 10.8*   < > 9.6* 8.9* 9.1* 10.1* 9.9*  HCT 34.2*   < > 30.2* 27.8* 29.0* 31.7* 31.1*  MCV 91.7   < > 92.1 88.5 89.0 90.8 90.4  PLT 280   < > 269 273 264 278 260   < > = values in this interval not displayed.   Lipid Panel: No results for input(s): "CHOL", "HDL", "LDLCALC", "TRIG", "CHOLHDL", "LDLDIRECT" in the last 8760 hours. TSH: Recent Labs    05/10/22 1532  TSH 0.623   A1C: Lab Results  Component Value Date   HGBA1C 5.4 03/26/2022     Assessment/Plan  1. Hyponatremia  - Basic Metabolic Panel with eGFR  2. Iron deficiency anemia, unspecified iron deficiency anemia type No signs of bleeding - CBC (no diff)  3. Primary hypertension Daughter is concerned about high BP readings  Will increase to lisinopril 10 mg bid Cont with bystolic - lisinopril (ZESTRIL) 10 MG tablet; Take 1 tablet (10 mg total) by mouth 2 (two) times daily.  Dispense: 180 tablet; Refill: 3  4. Cognitive and behavioral changes Increase physical activity and cognitively engaging activities  5. Major depression, recurrent, chronic (HCC) Stable, cont with lexapro  Other orders - ferrous sulfate 325 (65  FE) MG EC tablet; Take 325 mg by mouth 3 (three) times daily. - oxymetazoline (AFRIN) 0.05 % nasal spray; Place 1 spray into both nostrils as needed for congestion.   No follow-ups on file.:    I spent greater than  40 minutes for the care of this patient in face to face time, chart review, clinical documentation, patient education.

## 2023-04-19 LAB — BASIC METABOLIC PANEL WITH GFR
BUN: 15 mg/dL (ref 7–25)
CO2: 29 mmol/L (ref 20–32)
Calcium: 9.7 mg/dL (ref 8.6–10.4)
Chloride: 98 mmol/L (ref 98–110)
Creat: 0.93 mg/dL (ref 0.60–0.95)
Glucose, Bld: 90 mg/dL (ref 65–99)
Potassium: 4.8 mmol/L (ref 3.5–5.3)
Sodium: 134 mmol/L — ABNORMAL LOW (ref 135–146)
eGFR: 60 mL/min/{1.73_m2} (ref >=60)

## 2023-04-19 LAB — CBC
HCT: 32.6 % — ABNORMAL LOW (ref 35.0–45.0)
Hemoglobin: 10.3 g/dL — ABNORMAL LOW (ref 11.7–15.5)
MCH: 28.8 pg (ref 27.0–33.0)
MCHC: 31.6 g/dL — ABNORMAL LOW (ref 32.0–36.0)
MCV: 91.1 fL (ref 80.0–100.0)
MPV: 11.9 fL (ref 7.5–12.5)
Platelets: 256 10*3/uL (ref 140–400)
RBC: 3.58 10*6/uL — ABNORMAL LOW (ref 3.80–5.10)
RDW: 12.5 % (ref 11.0–15.0)
WBC: 6.3 10*3/uL (ref 3.8–10.8)

## 2023-04-20 ENCOUNTER — Encounter: Payer: Self-pay | Admitting: Sports Medicine

## 2023-04-24 ENCOUNTER — Encounter: Payer: Self-pay | Admitting: Cardiology

## 2023-04-24 NOTE — Telephone Encounter (Signed)
Message sent to Pharm D for advice.

## 2023-04-25 ENCOUNTER — Encounter: Payer: Self-pay | Admitting: Cardiology

## 2023-04-25 ENCOUNTER — Other Ambulatory Visit: Payer: Self-pay

## 2023-04-25 MED ORDER — RIVAROXABAN 15 MG PO TABS: ORAL_TABLET | ORAL | 3 refills | Status: DC

## 2023-04-25 MED ORDER — RIVAROXABAN 15 MG PO TABS: 15.0000 mg | ORAL_TABLET | Freq: Every day | ORAL | Status: DC

## 2023-04-25 NOTE — Telephone Encounter (Signed)
Spoke to [employer] at [employer].Advised to  stop Pradaxa.Start Xarelto 15 mg daily at supper.Order faxed to her at fax # [phone].  Spoke to daughter she is aware Pradaxa will be discontinued.Start Xarelto 15 mg daily at supper.

## 2023-04-25 NOTE — Telephone Encounter (Signed)
Yes that timing is fine  Peter Swaziland MD, [facility]

## 2023-05-05 ENCOUNTER — Other Ambulatory Visit: Payer: Self-pay | Admitting: Cardiology

## 2023-05-09 ENCOUNTER — Other Ambulatory Visit (HOSPITAL_BASED_OUTPATIENT_CLINIC_OR_DEPARTMENT_OTHER): Payer: Self-pay

## 2023-05-09 MED ORDER — RSVPREF3 VAC RECOMB ADJUVANTED 120 MCG/0.5ML IM SUSR
0.5000 mL | Freq: Once | INTRAMUSCULAR | 0 refills | Status: AC
  Filled 2023-05-09: qty 0.5, 1d supply, fill #0

## 2023-05-17 ENCOUNTER — Ambulatory Visit (HOSPITAL_BASED_OUTPATIENT_CLINIC_OR_DEPARTMENT_OTHER): Payer: Medicare Other | Admitting: Student

## 2023-05-19 ENCOUNTER — Encounter: Payer: Self-pay | Admitting: Cardiology

## 2023-05-30 ENCOUNTER — Ambulatory Visit: Payer: Medicare Other | Admitting: Sports Medicine

## 2023-05-30 ENCOUNTER — Other Ambulatory Visit: Payer: Self-pay | Admitting: Family Medicine

## 2023-05-30 DIAGNOSIS — T7840XA Allergy, unspecified, initial encounter: Secondary | ICD-10-CM

## 2023-05-31 ENCOUNTER — Ambulatory Visit: Payer: Medicare Other | Admitting: Sports Medicine

## 2023-06-01 ENCOUNTER — Ambulatory Visit (INDEPENDENT_AMBULATORY_CARE_PROVIDER_SITE_OTHER): Payer: Medicare Other | Admitting: Family

## 2023-06-01 ENCOUNTER — Encounter: Payer: Self-pay | Admitting: Family

## 2023-06-01 ENCOUNTER — Other Ambulatory Visit: Payer: Medicare Other

## 2023-06-01 VITALS — BP 122/68 | HR 75 | Temp 97.8°F | Resp 20 | Ht 61.0 in | Wt 114.4 lb

## 2023-06-01 DIAGNOSIS — E871 Hypo-osmolality and hyponatremia: Secondary | ICD-10-CM

## 2023-06-01 DIAGNOSIS — K5901 Slow transit constipation: Secondary | ICD-10-CM | POA: Diagnosis not present

## 2023-06-01 DIAGNOSIS — I10 Essential (primary) hypertension: Secondary | ICD-10-CM

## 2023-06-01 DIAGNOSIS — E785 Hyperlipidemia, unspecified: Secondary | ICD-10-CM

## 2023-06-01 DIAGNOSIS — R600 Localized edema: Secondary | ICD-10-CM

## 2023-06-01 MED ORDER — POLYETHYLENE GLYCOL 3350 17 GM/SCOOP PO POWD
17.0000 g | Freq: Every day | ORAL | 3 refills | Status: AC | PRN
Start: 2023-06-01 — End: ?

## 2023-06-01 NOTE — Progress Notes (Signed)
Provider: Arrionna Serena FNP-C  Venita Sheffield, MD  Patient Care Team: Venita Sheffield, MD as PCP - General (Internal Medicine) [place], Peter M, MD as PCP - Cardiology (Cardiology) Marisue Brooklyn, MD as Referring Physician  Extended Emergency Contact Information Primary Emergency Contact: [facility] Address: [address] [place], [place] [address] [place] of [employer] Phone: [phone] Relation: Daughter  Code Status:  DNR Goals of care: Advanced Directive information    04/18/2023    1:15 PM  Advanced Directives  Does Patient Have a Medical Advance Directive? Yes  Type of Estate agent of [place];Living will;Out of facility DNR (pink MOST or yellow form)  Does patient want to make changes to medical advance directive? No - Patient declined  Copy of Healthcare Power of Attorney in Chart? Yes - validated most recent copy scanned in chart (See row information)     Chief Complaint  Patient presents with   Medical Management of Chronic Issues    Patient presents today for a 6 weeks follow-up   Quality Metric Gaps    DEXA scan    HPI:  Barbara Mcconnell is a 88 y.o. female seen today for an acute visit for 6 weeks follow up for blood pressure.she was seen by Dr. Jacquenette Shone  on 04/18/2023 for high blood pressure and hyponatremia. Her lisinopril was increased from 5 mg tablet to 10 mg tablet daily. She resides at [facility] brought in medication list and order sheet to be returned to the facility.states was brought by facility transportation.she called her daughter on her cell phone during the visit and spoke with provider.States patient has up coming appointment with cardiologist would like certain routine lab work done prior to seeing cardiologist.Previous cardiologist notes reviewed no specified labs ordered or requested.discussed basic lab work.CBC,BMP already done by Dr.Veludandi on 03/2023. Had low sodium level. No B/p  readings brought from the facility but daughter states readings have been normal except episodes tends to run high prior to taking medication then runs within normal range after taking her meds. She denies any headache,dizziness,vision changes,fatigue,chest tightness,palpitation,chest pain or shortness of breath.  States on iron pills which tends to make her constipated.currently takes Colace but sometimes goes 3 days without a bowel movement.discussed increasing colace to twice daily but prefers to take miralax as needed.        Past Medical History:  Diagnosis Date   Allergies    Anxiety    panic attacks   Aortic stenosis    Arthritis    Bronchiectasis with (acute) exacerbation (HCC)    CAD (coronary artery disease)    non obst CAD   Depression due to acute stroke (HCC)    Dizziness    Elevated liver enzymes    Fall    Fluid overload    GERD (gastroesophageal reflux disease)    Heart murmur    Hemorrhoids    History of mammogram    Hyperlipemia    Hypertension    Internal carotid artery stenosis    OCD (obsessive compulsive disorder)    Orthostatic hypotension    Other cirrhosis of liver (HCC)    PAF (paroxysmal atrial fibrillation) (HCC)    Pericardial effusion    Physical deconditioning    Renal insufficiency    Rheumatic heart failure (congestive) (HCC)    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 Lynnette Caffey & Dr. Laneta Simmers   Seasonal allergies    Severe aortic stenosis    Stroke [facility])    Takotsubo cardiomyopathy    Past Surgical History:  Procedure Laterality Date   COLONOSCOPY     EYE SURGERY Bilateral    cataracts removed   INTRAOPERATIVE TRANSTHORACIC ECHOCARDIOGRAM N/A 08/03/2021   Procedure: INTRAOPERATIVE TRANSTHORACIC ECHOCARDIOGRAM;  Surgeon: Orbie Pyo, MD;  Location: [facility] INVASIVE CV LAB;  Service: Open Heart Surgery;  Laterality: N/A;   IR CT HEAD LTD  03/25/2022   IR  PERCUTANEOUS ART THROMBECTOMY/INFUSION INTRACRANIAL INC DIAG ANGIO  03/25/2022   IR US GUIDE VASC ACCESS RIGHT  03/25/2022   PERICARDIOCENTESIS N/A 07/28/2021   Procedure: PERICARDIOCENTESIS;  Surgeon: Orbie Pyo, MD;  Location: [facility] INVASIVE CV LAB;  Service: Cardiovascular;  Laterality: N/A;   RADIOLOGY WITH ANESTHESIA N/A 03/25/2022   Procedure: IR WITH ANESTHESIA;  Surgeon: Radiologist, Medication, MD;  Location: [facility] OR;  Service: Radiology;  Laterality: N/A;   RIGHT/LEFT HEART CATH AND CORONARY ANGIOGRAPHY N/A 05/13/2019   Procedure: RIGHT/LEFT HEART CATH AND CORONARY ANGIOGRAPHY;  Surgeon: [place]aziland, Peter M, MD;  Location: [facility] INVASIVE CV LAB;  Service: Cardiovascular;  Laterality: N/A;   RIGHT/LEFT HEART CATH AND CORONARY ANGIOGRAPHY N/A 07/23/2021   Procedure: RIGHT/LEFT HEART CATH AND CORONARY ANGIOGRAPHY;  Surgeon: Corky Crafts, MD;  Location: [facility] INVASIVE CV LAB;  Service: Cardiovascular;  Laterality: N/A;   TONSILLECTOMY     TRANSCATHETER AORTIC VALVE REPLACEMENT, TRANSFEMORAL N/A 08/03/2021   Procedure: TRANSCATHETER AORTIC VALVE REPLACEMENT, TRANSFEMORAL;  Surgeon: Orbie Pyo, MD;  Location: [facility] INVASIVE CV LAB;  Service: Open Heart Surgery;  Laterality: N/A;   UPPER GI ENDOSCOPY      Allergies  Allergen Reactions   Codeine Nausea And Vomiting   Cyclobenzaprine Other (See Comments)    Unknown reaction   Iodinated Contrast Media Nausea Only   Prednisone Other (See Comments)    "Sores in the mouth"    Outpatient Encounter Medications as of 06/01/2023  Medication Sig   acetaminophen (TYLENOL) 500 MG tablet Take 500-1,000 mg by mouth every 6 (six) hours as needed for moderate pain.   ACID REDUCER 10 MG tablet TAKE (1) TABLET BY MOUTH TWICE DAILY FOR GERD.   albuterol (VENTOLIN HFA) 108 (90 Base) MCG/ACT inhaler Inhale 2 puffs into the lungs every 4 (four) hours as needed for wheezing or shortness of breath (seasonal allergies).   docusate sodium (COLACE) 100 MG capsule  TAKE (1) CAPSULE BY MOUTH ONCE DAILY FOR CONSTIPATION.   escitalopram (LEXAPRO) 10 MG tablet TAKE (1) TABLET BY MOUTH ONCE DAILY.   ferrous sulfate 325 (65 FE) MG EC tablet Take 325 mg by mouth 3 (three) times daily.   fexofenadine (ALLEGRA) 60 MG tablet TAKE (1) TABLET BY MOUTH ONCE EVERY MORNING.   furosemide (LASIX) 20 MG tablet TAKE (1/2) TABLET (10MG ) BY MOUTH ONCE DAILY IN THE MORNING.   lisinopril (ZESTRIL) 10 MG tablet Take 1 tablet (10 mg total) by mouth 2 (two) times daily.   montelukast (SINGULAIR) 10 MG tablet TAKE (1) TABLET BY MOUTH ONCE DAILY.   Multiple Vitamins-Minerals (EMERGEN-C VITAMIN C PO) Take 1 packet by mouth daily. Mix with water   nebivolol (BYSTOLIC) 2.5 MG tablet Take 1 tablet (2.5 mg total) by mouth daily.   oxymetazoline (AFRIN) 0.05 % nasal spray Place 1 spray into both nostrils as needed for congestion.   Rivaroxaban (XARELTO) 15 MG TABS tablet TAKE (1) TABLET BY MOUTH ONCE DAILY.  rosuvastatin (CRESTOR) 20 MG tablet TAKE (1) TABLET BY MOUTH AT BEDTIME.   Trolamine Salicylate (ASPERCREME EX) Apply 1 application topically daily as needed (knee pain).   No facility-administered encounter medications on file as of 06/01/2023.    Review of Systems  Constitutional:  Negative for appetite change, chills, fatigue, fever and unexpected weight change.  HENT:  Negative for congestion, dental problem, ear discharge, ear pain, facial swelling, hearing loss, nosebleeds, postnasal drip, rhinorrhea, sinus pressure, sinus pain, sneezing, sore throat, tinnitus and trouble swallowing.   Eyes:  Negative for pain, discharge, redness, itching and visual disturbance.  Respiratory:  Negative for cough, chest tightness, shortness of breath and wheezing.   Cardiovascular:  Negative for chest pain, palpitations and leg swelling.  Gastrointestinal:  Positive for constipation. Negative for abdominal distention, abdominal pain, blood in stool, diarrhea, nausea and vomiting.   Genitourinary:  Negative for difficulty urinating, dysuria, flank pain, frequency and urgency.  Musculoskeletal:  Positive for gait problem. Negative for arthralgias, back pain, joint swelling and myalgias.  Skin:  Negative for color change, pallor, rash and wound.  Neurological:  Negative for dizziness, syncope, speech difficulty, weakness, light-headedness, numbness and headaches.  Hematological:  Does not bruise/bleed easily.  Psychiatric/Behavioral:  Negative for agitation, behavioral problems, confusion, hallucinations and sleep disturbance. The patient is not nervous/anxious.        Forgetful     Immunization History  Administered Date(s) Administered   Fluad Quad(high Dose 65+) 04/07/2019, 05/13/2022   Fluad Trivalent(High Dose 65+) 04/18/2023   Influenza Split 05/10/2006, 04/25/2007, 04/08/2008, 11/10/2008   Influenza, High Dose Seasonal PF 04/20/2017, 04/07/2019, 03/25/2020, 04/27/2021   Influenza-Unspecified 05/10/2006, 04/25/2007, 04/08/2008, 07/09/2008, 11/10/2008, 03/31/2015, 04/20/2018, 04/07/2019   PFIZER Comirnaty(Gray Top)Covid-19 Tri-Sucrose Vaccine 08/14/2019, 09/04/2019, 04/20/2020, 10/25/2020, 04/12/2021   PPD Test 05/12/2022   Pfizer(Comirnaty)Fall Seasonal Vaccine 12 years and older 04/07/2023   Pneumococcal Conjugate-13 09/29/2015   Pneumococcal Polysaccharide-23 03/12/2007   Respiratory Syncytial Virus Vaccine,Recomb Aduvanted(Arexvy) 05/09/2023   Tdap 07/14/2014   Zoster Recombinant(Shingrix) 12/10/2019, 02/08/2020   Zoster, Live 08/18/2004, 01/17/2007   Pertinent  Health Maintenance Due  Topic Date Due   DEXA SCAN  Never done   INFLUENZA VACCINE  Completed      06/28/2022    1:36 PM 09/26/2022    9:03 AM 02/15/2023    3:05 PM 04/18/2023    1:15 PM 06/01/2023    9:46 AM  Fall Risk  Falls in the past year? 1 0 0 0 0  Was there an injury with Fall? 1 0 0 0 0  Fall Risk Category Calculator 2 0 0 0 0  Fall Risk Category (Retired) Moderate      (RETIRED)  Patient Fall Risk Level Moderate fall risk      Patient at Risk for Falls Due to History of fall(s);Impaired balance/gait;Impaired mobility No Fall Risks No Fall Risks No Fall Risks No Fall Risks  Fall risk Follow up Falls evaluation completed;Education provided;Falls prevention discussed Falls evaluation completed Falls evaluation completed;Education provided;Falls prevention discussed Falls evaluation completed;Education provided;Falls prevention discussed Falls evaluation completed   Functional Status Survey:    Vitals:   06/01/23 0943  BP: 122/68  Pulse: 75  Resp: 20  SpO2: 98%  Weight: 114 lb 6.4 oz (51.9 kg)  Height: 5\' 1"  (1.549 m)   Body mass index is 21.62 kg/m. Physical Exam Vitals reviewed.  Constitutional:      General: She is not in acute distress.    Appearance: Normal appearance. She is normal weight. She is  not ill-appearing or diaphoretic.  HENT:     Head: Normocephalic.     Mouth/Throat:     Mouth: Mucous membranes are moist.     Pharynx: Oropharynx is clear. No oropharyngeal exudate or posterior oropharyngeal erythema.  Eyes:     General: No scleral icterus.       Right eye: No discharge.        Left eye: No discharge.     Conjunctiva/sclera: Conjunctivae normal.     Pupils: Pupils are equal, round, and reactive to light.  Neck:     Vascular: No carotid bruit.  Cardiovascular:     Rate and Rhythm: Normal rate and regular rhythm.     Pulses: Normal pulses.     Heart sounds: Normal heart sounds. No murmur heard.    No friction rub. No gallop.  Pulmonary:     Effort: Pulmonary effort is normal. No respiratory distress.     Breath sounds: Normal breath sounds. No wheezing, rhonchi or rales.  Chest:     Chest wall: No tenderness.  Abdominal:     General: Bowel sounds are normal. There is no distension.     Palpations: Abdomen is soft. There is no mass.     Tenderness: There is no abdominal tenderness. There is no right CVA tenderness, left CVA  tenderness, guarding or rebound.  Musculoskeletal:        General: No swelling or tenderness. Normal range of motion.     Cervical back: Normal range of motion. No rigidity or tenderness.     Right lower leg: Edema present.     Left lower leg: Edema present.     Comments: Bilateral ankle trace edema   Lymphadenopathy:     Cervical: No cervical adenopathy.  Skin:    General: Skin is warm and dry.     Coloration: Skin is not pale.     Findings: No bruising, erythema, lesion or rash.  Neurological:     Mental Status: She is alert and oriented to person, place, and time.     Motor: No weakness.     Gait: Gait abnormal.  Psychiatric:        Mood and Affect: Mood normal.        Speech: Speech normal.        Behavior: Behavior normal.        Thought Content: Thought content normal.        Cognition and Memory: She exhibits impaired recent memory.        Judgment: Judgment normal.     Labs reviewed: Recent Labs    02/15/23 1614 03/07/23 1922 03/15/23 0000 04/18/23 1354  NA 139 128* 131* 134*  K 4.1 4.0 4.3 4.8  CL 103 95* 96* 98  CO2 29 25 27* 29  GLUCOSE 95 97  --  90  BUN 21 15 17 15   CREATININE 1.07* 0.94 1.1 0.93  CALCIUM 9.5 9.1 9.6 9.7   Recent Labs    08/08/22 1413 11/09/22 1644  AST 45* 53*  ALT 19 22  BILITOT 1.0 1.1  PROT 6.3 6.7   Recent Labs    08/08/22 1413 11/09/22 1644 02/15/23 1614 03/07/23 1922 04/18/23 1354  WBC 6.3   < > 6.9 7.3 6.3  NEUTROABS 3,522  --   --   --   --   HGB 8.9*   < > 10.1* 9.9* 10.3*  HCT 27.8*   < > 31.7* 31.1* 32.6*  MCV 88.5   < >  90.8 90.4 91.1  PLT 273   < > 278 260 256   < > = values in this interval not displayed.   Lab Results  Component Value Date   TSH 0.623 05/10/2022   Lab Results  Component Value Date   HGBA1C 5.4 03/26/2022   Lab Results  Component Value Date   CHOL 204 (H) 03/26/2022   HDL 86 03/26/2022   LDLCALC 103 (H) 03/26/2022   TRIG 77 03/26/2022   CHOLHDL 2.4 03/26/2022     Significant Diagnostic Results in last 30 days:  No results found.  Assessment/Plan 1. Primary hypertension Blood pressure at goal -Continue on nebivolol, lisinopril and furosemide -Continue to monitor blood pressure daily -Follow-up with a cardiologist as scheduled - Basic metabolic panel - TSH  2. Hyponatremia Previous sodium was 13 4 - Basic metabolic panel  3. Slow transit constipation Occasional constipation on iron -Continue on Colace -Request MiraLAX as needed as below -Encouraged to increase fiber in her diet - polyethylene glycol powder (GLYCOLAX/MIRALAX) 17 GM/SCOOP powder; Take 17 g by mouth daily as needed for moderate constipation. Hold for loose stool  Dispense: 116 g; Refill: 3  4. Dyslipidemia, goal LDL below 70 Previous LDL not at goal -Continue on rosuvastatin -Continue to follow-up with a cardiologist - Lipid Panel  5. Lower extremity edema Bilateral trace edema -Encouraged to keep feet elevated while seated -Knee-high compression stockings on in the morning and off at bedtime -Reduce salt intake in diet -Continue on furosemide -Continue to monitor weight  Family/ staff Communication: Reviewed plan of care with patient verbalized understanding  Labs/tests ordered:  - Basic metabolic panel - TSH - Lipid Panel  Next Appointment: Return in about 6 months (around 11/29/2023) for medical mangement of chronic issues with Dr.Veludandi .   Caesar Bookman, NP

## 2023-06-02 ENCOUNTER — Telehealth: Payer: Self-pay | Admitting: *Deleted

## 2023-06-02 LAB — BASIC METABOLIC PANEL
BUN/Creatinine Ratio: 14 (calc) (ref 6–22)
BUN: 15 mg/dL (ref 7–25)
CO2: 32 mmol/L (ref 20–32)
Calcium: 9.7 mg/dL (ref 8.6–10.4)
Chloride: 94 mmol/L — ABNORMAL LOW (ref 98–110)
Creat: 1.07 mg/dL — ABNORMAL HIGH (ref 0.60–0.95)
Glucose, Bld: 93 mg/dL (ref 65–139)
Potassium: 4.4 mmol/L (ref 3.5–5.3)
Sodium: 131 mmol/L — ABNORMAL LOW (ref 135–146)

## 2023-06-02 LAB — LIPID PANEL
Cholesterol: 200 mg/dL — ABNORMAL HIGH (ref <200)
HDL: 93 mg/dL (ref >=50)
LDL Cholesterol (Calc): 82 mg/dL
Non-HDL Cholesterol (Calc): 107 mg/dL (ref <130)
Total CHOL/HDL Ratio: 2.2 (calc) (ref <5.0)
Triglycerides: 145 mg/dL (ref <150)

## 2023-06-02 LAB — TSH: TSH: 0.88 m[IU]/L (ref 0.40–4.50)

## 2023-06-02 NOTE — Telephone Encounter (Signed)
[name], Daughter called and stated that patient was seen yesterday 11/7 by Carilyn Goodpasture and had labwork done. Stated that she looked on MyChart and she is concerned with the: Sodium 131 Chloride 94  Stated that previously when these numbers was low patient was sent to the Hospital for IV fluids. She is concerned because of the shortage of IV fluids.   Please advise.

## 2023-06-02 NOTE — Telephone Encounter (Signed)
Patient daughter, [name] notified and agreed.

## 2023-06-02 NOTE — Telephone Encounter (Signed)
Sodium level is at her baseline.recommend discuss with Cardiologist on upcoming visit if okay to drink Gatorade for few days to increase sodium level.

## 2023-06-05 ENCOUNTER — Ambulatory Visit: Payer: Medicare Other | Admitting: Adult Health

## 2023-06-08 ENCOUNTER — Encounter: Payer: Self-pay | Admitting: Cardiology

## 2023-06-12 NOTE — Progress Notes (Unsigned)
Cardiology Office Note:    Date:  06/12/2023   ID:  Barbara Mcconnell, DOB Apr 10, 1935, MRN [id number]  PCP:  Barbara Sheffield, MD  Cardiologist:  Barbara Madeira [place], MD  Electrophysiologist:  None   Referring MD: Barbara Mcconnell*   No chief complaint on file.   History of Present Illness:    Barbara Mcconnell is a 88 y.o. female is seen for follow up aortic stenosis s/p TAVR and Takotsubo cardiomyopathy.  She had a cardiac event approximately 7 years ago in [facility], we have no records of that. According to the patient this was Takotsubos cardiomyopathy.   Other medical issues include hypertension, peripheral vascular disease with moderate carotid stenosis, and dyslipidemia with a history of statin intolerance.    The patient presented to the med center in [facility] 05/13/2019 with substernal chest pain and diaphoresis.  She ruled in for an MI.  Admitted to [facility] hospital. Catheterization was done which showed a 50% RCA otherwise normal coronaries and an ejection fraction of 30 to 35% with moderate AS. LV pattern was c/w Takotsubo's disease.  In the hospital she was somewhat hypotensive but this improved prior to discharge and she was put back on her lisinopril but at a lower dose. She is also on low dose Toprol.  On repeat Echo in March 2021 her EF had normalized but there was moderate to severe Aortic stenosis and repeat in 6 months was recommended. Unfortunately she did not have follow up until she presented to [facility] 07/21/21-07/30/21 for acute dyspnea. Echo showed normal LVEF with a moderate circumfrential pericardial effusion measuring 1.5 cm. There was moderate AS with a mean gradient at (was 20.37mmhg), AVA 1.38cm and DI 0.29. She underwent [facility] 07/23/21 which showed stable, mild to moderate CAD with hemodynamics consistent with pulmonary hypertension. Due to moderate AS, she was initially felt to be a good PROGRESS trial candidate however with discrepancies between echocardiogram  findings and inability to cross AV, pre-TAVR CT imaging was obtained. These showed an AVA 1.01cm2, annular area measuring 447mm2, and large circumferential pericardial effusion. After further discussion, she opted to defer PROGRESS trail consideration and move forward with TAVR given progressive symptoms, LVH with small LV cavity and calcium score at 2093. Her hospital stay was further complicated by new onset atrial fibrillation with RVR. She was placed on diltiazem infusion with relatively quick conversion to NSR. She ultimately underwent pericardiocentesis 07/28/21 and drain placement. This was discontinued 07/29/21 and the patient was discharged from the hospital 07/30/21. During her admission, she was evaluated by our multidisciplinary valve team and plans were made for TAVR on 08/03/21.    She underwent successful TAVR with a 23 mm Edwards Sapien 3 Resilia via the TF approach on 08/03/21. Post operative echo showed LVEF at 65-70% with G1DD, mild MR, mild MS, trivial PVL in the right coronary ostium with mild MR with a mean gradient at 10.58mmHg. ECG with NSR and frequent PVCs however no high grade heart block. She was initially started on ASA 81mg  QD however this was changed to Eliquis 2.5mg  BID after discharge due to AF on ZIO monitor.  She was admitted 9/1-03/30/22 with acute left MCA stroke. She had difficulty with word finding and confusion. She was on Eliquis. Had IR intervention of M2 segment with apparent reocclusion of M3 segment. Echo was stable. CT showed severe stenosis at the origin of the RICA. No significant stenosis on the left. (Prior dopplers in Feb showed 50-69% RICA stenosis). She was switched from Eliquis  to Pradaxa. DC to SNF.  While in hospital no Afib seen. She was initially at [facility] but has since been moved to [facility] assisted living. She did wear an Event monitor which showed some nonsustained atrial arrhythmias. She is seen with her daughter today.   She did fall in October. Was  found to be orthostatic so metoprolol was held. DC on very low dose lisinopril 1.25 mg daily. This was later increased to 2.5 mg daily.   She developed elevated blood pressure and was seen in the ER 03/07/2023.  SBP 170-200 range at home. Had been drifting up to 140-170 range.  BP on arrival 181/66.  EDP did not feel she needed emergent blood pressure lowering and discharged her without admission with close follow-up with cardiology. They increased her lisinopril from 2.5 mg to 5 mg. Of note, Na was also 128, treated with IVF. She was having diarrhea over a few days. Was seen in follow up by Barbara Mcconnell on 8/14, lisinopril increased to 5 mg bid. Loud murmur noted and Echo ordered. This showed hyperdynamic LV function with gr 2 diastolic dysfunction. Normal TAVR function. Later lisinopril increased to 20 mg daily for persistently elevated BP.  She is actually seen alone today. Daughter Barbara Mcconnell was not hear today. Patient states she feels fine but hates her living situation and the limits she has now. Doesn't want anything done to prolong life. Doesn't want additional meds. Frustrated. Denies SOB. No increase in edema weight is down 2 lbs. BP readings at [facility] range from 127-175 systolic.    Past Medical History:  Diagnosis Date   Allergies    Anxiety    panic attacks   Aortic stenosis    Arthritis    Bronchiectasis with (acute) exacerbation (HCC)    CAD (coronary artery disease)    non obst CAD   Depression due to acute stroke (HCC)    Dizziness    Elevated liver enzymes    Fall    Fluid overload    GERD (gastroesophageal reflux disease)    Heart murmur    Hemorrhoids    History of mammogram    Hyperlipemia    Hypertension    Internal carotid artery stenosis    OCD (obsessive compulsive disorder)    Orthostatic hypotension    Other cirrhosis of liver (HCC)    PAF (paroxysmal atrial fibrillation) (HCC)    Pericardial effusion    Physical deconditioning    Renal insufficiency     Rheumatic heart failure (congestive) (HCC)    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 Lynnette Caffey & Dr. Laneta Simmers   Seasonal allergies    Severe aortic stenosis    Stroke [facility])    Takotsubo cardiomyopathy     Past Surgical History:  Procedure Laterality Date   COLONOSCOPY     EYE SURGERY Bilateral    cataracts removed   INTRAOPERATIVE TRANSTHORACIC ECHOCARDIOGRAM N/A 08/03/2021   Procedure: INTRAOPERATIVE TRANSTHORACIC ECHOCARDIOGRAM;  Surgeon: Orbie Pyo, MD;  Location: [facility] INVASIVE CV LAB;  Service: Open Heart Surgery;  Laterality: N/A;   IR CT HEAD LTD  03/25/2022   IR PERCUTANEOUS ART THROMBECTOMY/INFUSION INTRACRANIAL INC DIAG ANGIO  03/25/2022   IR US GUIDE VASC ACCESS RIGHT  03/25/2022   PERICARDIOCENTESIS N/A 07/28/2021   Procedure: PERICARDIOCENTESIS;  Surgeon: Orbie Pyo, MD;  Location: [facility] INVASIVE CV LAB;  Service: Cardiovascular;  Laterality: N/A;  RADIOLOGY WITH ANESTHESIA N/A 03/25/2022   Procedure: IR WITH ANESTHESIA;  Surgeon: Radiologist, Medication, MD;  Location: [facility] OR;  Service: Radiology;  Laterality: N/A;   RIGHT/LEFT HEART CATH AND CORONARY ANGIOGRAPHY N/A 05/13/2019   Procedure: RIGHT/LEFT HEART CATH AND CORONARY ANGIOGRAPHY;  Surgeon: Mcconnell, Iker Nuttall M, MD;  Location: [facility] INVASIVE CV LAB;  Service: Cardiovascular;  Laterality: N/A;   RIGHT/LEFT HEART CATH AND CORONARY ANGIOGRAPHY N/A 07/23/2021   Procedure: RIGHT/LEFT HEART CATH AND CORONARY ANGIOGRAPHY;  Surgeon: Corky Crafts, MD;  Location: [facility] INVASIVE CV LAB;  Service: Cardiovascular;  Laterality: N/A;   TONSILLECTOMY     TRANSCATHETER AORTIC VALVE REPLACEMENT, TRANSFEMORAL N/A 08/03/2021   Procedure: TRANSCATHETER AORTIC VALVE REPLACEMENT, TRANSFEMORAL;  Surgeon: Orbie Pyo, MD;  Location: [facility] INVASIVE CV LAB;  Service: Open Heart Surgery;  Laterality: N/A;   UPPER GI ENDOSCOPY      Current Medications: No  outpatient medications have been marked as taking for the 06/15/23 encounter (Appointment) with Mcconnell, Jhony Antrim M, MD.     Allergies:   Codeine, Cyclobenzaprine, Iodinated contrast media, and Prednisone   Social History   Socioeconomic History   Marital status: Divorced    Spouse name: Not on file   Number of children: Not on file   Years of education: Not on file   Highest education level: 12th grade  Occupational History   Not on file  Tobacco Use   Smoking status: Former    Current packs/day: 1.00    Average packs/day: 1 pack/day for 20.0 years (20.0 ttl pk-yrs)    Types: Cigarettes   Smokeless tobacco: Never   Tobacco comments:    Quit 30 yrs ago as of 07/2021 per daughter  Vaping Use   Vaping status: Never Used  Substance and Sexual Activity   Alcohol use: Not Currently    Comment: occasionally wine   Drug use: Never   Sexual activity: Not Currently    Birth control/protection: Post-menopausal  Other Topics Concern   Not on file  Social History Narrative   Tobacco use, amount per day now: None   Past tobacco use, amount per day: Up to 1 pack   How many years did you use tobacco: 20 years.   Alcohol use (drinks per week): None.   Diet: 3 meals daily/mostly cooked at home.   Do you drink/eat things with caffeine: periodic diet coke.   Marital status:  Divorced.                                What year were you married? 1968   Do you live in a house, apartment, assisted living, condo, trailer, etc.? One Level Townhome   Is it one or more stories? One Level   How many persons live in your home? One   Do you have pets in your home?( please list) No   Highest Level of education completed? High School   Current or past profession: Immunologist.    Do you exercise?   Yes when able.                               Type and how often? Silver Sneakers 2-3 times weekly.   Do you have a living will? Yes   Do you have a DNR form?        No  If not, do you want to discuss one? Yes   Do you have signed POA/HPOA forms?   Yes                     If so, please bring to you appointment      Do you have any difficulty bathing or dressing yourself? Yes   Do you have any difficulty preparing food or eating? Yes   Do you have any difficulty managing your medications? Yes   Do you have any difficulty managing your finances? No   Do you have any difficulty affording your medications? No   Social Determinants of Health   Financial Resource Strain: Medium Risk (05/31/2023)   Overall Financial Resource Strain (CARDIA)    Difficulty of Paying Living Expenses: Somewhat hard  Food Insecurity: No Food Insecurity (05/31/2023)   Hunger Vital Sign    Worried About Running Out of Food in the Last Year: Never true    Ran Out of Food in the Last Year: Never true  Transportation Needs: No Transportation Needs (05/31/2023)   PRAPARE - Administrator, Civil Service (Medical): No    Lack of Transportation (Non-Medical): No  Physical Activity: Unknown (05/31/2023)   Exercise Vital Sign    Days of Exercise per Week: 0 days    Minutes of Exercise per Session: Not on file  Stress: Stress Concern Present (05/31/2023)   [employer] of Occupational Health - Occupational Stress Questionnaire    Feeling of Stress : Very much  Social Connections: Socially Isolated (05/31/2023)   Social Connection and Isolation Panel [NHANES]    Frequency of Communication with Friends and Family: More than three times a week    Frequency of Social Gatherings with Friends and Family: Twice a week    Attends Religious Services: Never    Database administrator or Organizations: No    Attends Engineer, structural: Not on file    Marital Status: Divorced     Family History: The patient's family history includes Alcohol abuse in her sister; Brain cancer in her brother; Cancer in her brother and sister; Diabetes in her brother; Down syndrome in her  brother; Endometriosis in her daughter; Heart Problems in her brother; Heart failure in her brother and brother; Hypertension in an other family member; Lung cancer in her sister; Melanoma in her brother.  ROS:   Please see the history of present illness.     All other systems reviewed and are negative.  EKGs/Labs/Other Studies Reviewed:     EKG:  EKG is not ordered today.    Recent Labs: 11/09/2022: ALT 22 04/18/2023: Hemoglobin 10.3; Platelets 256 06/01/2023: BUN 15; Creat 1.07; Potassium 4.4; Sodium 131; TSH 0.88  Recent Lipid Panel    Component Value Date/Time   CHOL 200 (H) 06/01/2023 1034   TRIG 145 06/01/2023 1034   HDL 93 06/01/2023 1034   CHOLHDL 2.2 06/01/2023 1034   VLDL 15 03/26/2022 0137   LDLCALC 82 06/01/2023 1034    Dated 02/15/23: creatinine 1.07. otherwise CMET normal. Hgb 10.1  Physical Exam:    VS:  There were no vitals taken for this visit.    Wt Readings from Last 3 Encounters:  06/01/23 114 lb 6.4 oz (51.9 kg)  04/18/23 114 lb (51.7 kg)  03/20/23 112 lb 9.6 oz (51.1 kg)     GEN: Thin caucasian female, well developed in no acute distress HEENT: Normal NECK: No JVD LYMPHATICS: No lymphadenopathy CARDIAC: RRR,  gr 2/6 diastolic murmur LSB, no rubs, gallops RESPIRATORY:  Clear to auscultation without rales, wheezing or rhonchi  ABDOMEN: Soft, non-tender, non-distended MUSCULOSKELETAL:  No edema; No deformity  SKIN: Warm and dry NEUROLOGIC:  Alert and oriented x 3. Some word finding difficulty.  PSYCHIATRIC:  Normal affect   Cardiac studies:  Echo 08/04/21:    1. Left ventricular ejection fraction, by estimation, is 65 to 70%. The  left ventricle has normal function. The left ventricle has no regional  wall motion abnormalities. There is mild left ventricular hypertrophy of  the basal-septal segment. Left  ventricular diastolic parameters are consistent with Grade I diastolic  dysfunction (impaired relaxation). Elevated left atrial pressure.   2.  Right ventricular systolic function is normal. The right ventricular  size is normal. Tricuspid regurgitation signal is inadequate for assessing  PA pressure.   3. Left atrial size was mildly dilated.   4. The mitral valve is degenerative. Mild mitral valve regurgitation.  Mild mitral stenosis. The mean mitral valve gradient is 7.0 mmHg with  average heart rate of 93 bpm. Severe mitral annular calcification.   5. There is trivial perivalvular leak in the vicinity of the right  coronary ostium. The aortic valve has been repaired/replaced. Aortic valve  regurgitation is mild. There is a 23 mm Sapien prosthetic (TAVR) valve  present in the aortic position.  Procedure Date: 08/03/21. Aortic valve mean gradient measures 10.2 mmHg.  Aortic valve Vmax measures 2.22 m/s. Aortic valve acceleration time  measures 58 msec.   6. The inferior vena cava is dilated in size with <50% respiratory  variability, suggesting right atrial pressure of 15 mmHg.    ______________________   Echo 09/10/21 IMPRESSIONS  1. 23 mm S3 is present in the aortic position. There is abnormal color  flow in the region of the prosthesis, but this is forward flow based on  color doppler. There is no apparenent regurgitation or paravalvular leak.  Possibly, this represents flow  between the stent frame and aortic root. There is also a small color  signal below the annulus in the 8-10 o'clock position. Cannot exclude a  very small iatrogenic VSD. Vmax 2.1 m/s, MG 9.5 mmHG, EOA 2.42 cm2.  Hemodynamics are within limits. Would  consider TEE for characterization if clinically indicated. The aortic  valve has been repaired/replaced. Aortic valve regurgitation is not  visualized. There is a 23 mm Sapien prosthetic (TAVR) valve present in the  aortic position. Procedure Date:  08/03/2021.   2. Left ventricular ejection fraction, by estimation, is >75%. The left  ventricle has hyperdynamic function. The left ventricle has no  regional  wall motion abnormalities. There is moderate concentric left ventricular  hypertrophy. Left ventricular  diastolic function could not be evaluated.   3. Right ventricular systolic function is normal. The right ventricular  size is normal. There is normal pulmonary artery systolic pressure. The  estimated right ventricular systolic pressure is 26.0 mmHg.   4. Left atrial size was mildly dilated.   5. The mitral valve is degenerative. No evidence of mitral valve  regurgitation. Moderate mitral annular calcification.   6. The inferior vena cava is normal in size with greater than 50%  respiratory variability, suggesting right atrial pressure of 3 mmHg.   Comparison(s): No significant change from prior study. AoV gradients  stable. Color jet described above that is abnormal.     Corky Crafts, MD (Primary)     Procedures  RIGHT/LEFT HEART CATH AND CORONARY ANGIOGRAPHY  Conclusion      Prox RCA lesion is 50% stenosed.   Prox Cx to Mid Cx lesion is 25% stenosed.   Prox LAD to Mid LAD lesion is 25% stenosed.   Hemodynamic findings consistent with pulmonary hypertension.   Stable, mild to moderate coronary artery disease.  No indication for PCI.   Unable to cross aortic valve, consistent with worsening noted on echocardiogram.  Continue plans for TAVR w/u.   Echo 03/26/22: IMPRESSIONS     1. Left ventricular ejection fraction, by estimation, is 65 to 70%. The  left ventricle has normal function. The left ventricle has no regional  wall motion abnormalities. There is moderate left ventricular hypertrophy.  Left ventricular diastolic  parameters are indeterminate.   2. Right ventricular systolic function is normal. The right ventricular  size is normal. Tricuspid regurgitation signal is inadequate for assessing  PA pressure.   3. Left atrial size was moderately dilated.   4. The mitral valve is degenerative. Trivial mitral valve regurgitation.  Mild mitral  stenosis. The mean mitral valve gradient is 5.0 mmHg with  average heart rate of 68 bpm. Severe mitral annular calcification.   5. The abnormal color flow signal seen previously below the prosthetic  aortic valve is seen again on this exam, color flow in multiple views  suggestive of valvular regurgitation, though it cannot be demonstrated  with spectral Doppler, which would  support color flow artifact, possibly from stent frame of valve. No  evidence of prosthetic valve obstruction or significant regurgitation.      The aortic valve has been repaired/replaced. Aortic valve  regurgitation is mild. There is a 23 mm Sapien prosthetic (TAVR) valve  present in the aortic position. Procedure Date: 08/03/21. Echo findings are  consistent with likely stable structure and  function of the aortic valve prosthesis. Aortic valve area, by VTI  measures 2.58 cm. Aortic valve mean gradient measures 11.0 mmHg. Aortic  valve Vmax measures 1.98 m/s. Aortic valve acceleration time measures 85  msec.   6. The inferior vena cava is normal in size with greater than 50%  respiratory variability, suggesting right atrial pressure of 3 mmHg.    Event monitor 06/07/22: Study Highlights      Nromal sinus rhythm   Rare PVCs with bigeminy   Occasional PACs with short runs of SVT   On October 27 there were 2 episoded of SVT c/w atrial flutter at 2:48 and 5:42 pm. longest lasting 8 minutes and 24 seconds.     Patch Wear Time:  13 days and 21 hours (2023-10-20T13:40:10-398 to 2023-11-03T11:06:28-0400)   Patient had a min HR of 61 bpm, max HR of 211 bpm, and avg HR of 90 bpm. Predominant underlying rhythm was Sinus Rhythm. QRS morphology changes were present throughout recording. 53 Supraventricular Tachycardia runs occurred, the run with the fastest  interval lasting 6 beats with a max rate of 211 bpm, the longest lasting 8 mins 24 secs with an avg rate of 158 bpm. Some episodes of Supraventricular Tachycardia may  be possible Atrial Tachycardia with variable block. Isolated SVEs were occasional  (1.6%, 28180), SVE Couplets were rare (<1.0%, 955), and SVE Triplets were rare (<1.0%, 127). Isolated VEs were rare (<1.0%, 12422), VE Couplets were rare (<1.0%, 213), and VE Triplets were rare (<1.0%, 63). Ventricular Bigeminy and Trigeminy were   Echo 08/10/22: IMPRESSIONS     1. On image 60, there is high velocity color flow seen extending into the  LV septum. Cannot exclude VSD.  Left ventricular ejection fraction, by  estimation, is 65 to 70%. The left ventricle has normal function. The left  ventricle has no regional wall  motion abnormalities. There is severe asymmetric left ventricular  hypertrophy of the septal segment. Left ventricular diastolic parameters  are consistent with Grade I diastolic dysfunction (impaired relaxation).   2. Right ventricular systolic function is normal. The right ventricular  size is normal. There is normal pulmonary artery systolic pressure.   3. Left atrial size was moderately dilated.   4. The mitral valve is degenerative. Trivial mitral valve regurgitation.  Mild mitral stenosis. Severe mitral annular calcification.   5. Tricuspid valve regurgitation is mild to moderate.   6. The aortic valve has been repaired/replaced. Aortic valve  regurgitation is trivial. There is a 23 mm Sapien prosthetic (TAVR) valve  present in the aortic position. Procedure Date: 08/03/2021. Aortic  regurgitation PHT measures 199 msec. Aortic valve  area, by VTI measures 3.18 cm. Aortic valve mean gradient measures 14.3  mmHg. Aortic valve Vmax measures 2.43 m/s.   7. The inferior vena cava is normal in size with greater than 50%  respiratory variability, suggesting right atrial pressure of 3 mmHg.   Comparison(s): Prior images reviewed side by side.   Echo 03/13/23: IMPRESSIONS     1. Left ventricular ejection fraction, by estimation, is 70 to 75%. The  left ventricle has hyperdynamic  function. The left ventricle has no  regional wall motion abnormalities. There is severe left ventricular  hypertrophy. Left ventricular diastolic  parameters are consistent with Grade II diastolic dysfunction  (pseudonormalization). Elevated left atrial pressure.   2. Right ventricular systolic function is normal. The right ventricular  size is normal. There is normal pulmonary artery systolic pressure. The  estimated right ventricular systolic pressure is 33.0 mmHg.   3. Left atrial size was mildly dilated.   4. The mitral valve is degenerative. Trivial mitral valve regurgitation.  Mild mitral stenosis. The mean mitral valve gradient is 4.0 mmHg with  average heart rate of 77 bpm. Severe mitral annular calcification.   5. The aortic valve has been repaired/replaced. Aortic valve  regurgitation is mild. There is a 23 mm Edwards Sapien prosthetic (TAVR)  valve present in the aortic position. Procedure Date: 08/03/2021. Mild PVL.  Vmax 2.8 m/s, MG , EOA 1.9 cm^2, DI  0.62.   6. Aortic dilatation noted. There is dilatation of the ascending aorta,  measuring 40 mm.   7. The inferior vena cava is normal in size with greater than 50%  respiratory variability, suggesting right atrial pressure of 3 mmHg.   ASSESSMENT:     Severe AS s/p TAVR: last Echo showed hyperdynamic LV function. Prosthesis is functioning well.  mild AI.  No change in exam today. She has Amoxicillin for SBE prophylaxis. Continue anticoagulation.      2.  Left MCA stroke. Was on Eliquis - now switched to Xarelto.  Prior Afib burden low. No rate control necessary  3.  Hx of pericardial effusion: s/p pericardial drainage. no recurrent effusion on echo   4. Paroxysmal fibrillation: continue Xarelto given CVA.    5. Carotid artery disease: pre TAVR head/neck CTA with moderate right carotid artery stenosis with plans for surveillance dopplers in the OP setting. She is followed by [facility]- Dr Karin Lieu. Dopplers in Feb showed  moderate RICA disease. More recent CT angio suggests severe stenosis at origin of RICA similar to prior CT. Follow up with [facility]. Patient clearly does not want any intervention.  5. s/p Non-ST elevation (NSTEMI) myocardial infarction (HCC) due to demand ischemia in setting of stress induced CM in 2020 05/13/2019- HS Troponin peak 879. With decreased EF and no significant CAD strongly suspect this is related to recurrent Takotsubo cardiomyopathy  6. Takotsubo cardiomyopathy H/O remote Takotsubo event 5 years ago-Recurrent in October 2020 EF 30-35% by echo 05/13/2019 when she presented with second event - now with normal LV function.  7. Essential hypertension BP is staying high despite increase in lisinopril. No on 10 mg bid. I would be inclined to increase Zbeta to 5 mg daily but patient is adament that she does not want any change. Will defer to PCP.   8. Dyslipidemia On Crestor  9. Chronic diastolic dysfunction. Appears euvolemic. Continue current lasix. She does have chronic hyponatremia that is stable. Monitor weight and swelling.   Follow up in 6 months.  Medication Adjustments/Labs and Tests Ordered: Current medicines are reviewed at length with the patient today.  Concerns regarding medicines are outlined above.  No orders of the defined types were placed in this encounter.  No orders of the defined types were placed in this encounter.   There are no Patient Instructions on file for this visit.   Signed, Jaidin Richison [place], MD  06/12/2023 12:36 PM    [facility]

## 2023-06-14 ENCOUNTER — Ambulatory Visit: Payer: Medicare Other | Admitting: Family Medicine

## 2023-06-15 ENCOUNTER — Ambulatory Visit: Payer: Medicare Other | Attending: Cardiology | Admitting: Cardiology

## 2023-06-15 VITALS — BP 160/60 | HR 77 | Ht 60.0 in | Wt 112.0 lb

## 2023-06-15 DIAGNOSIS — Z952 Presence of prosthetic heart valve: Secondary | ICD-10-CM | POA: Diagnosis not present

## 2023-06-15 DIAGNOSIS — I10 Essential (primary) hypertension: Secondary | ICD-10-CM | POA: Diagnosis not present

## 2023-06-15 DIAGNOSIS — I5032 Chronic diastolic (congestive) heart failure: Secondary | ICD-10-CM | POA: Diagnosis not present

## 2023-06-15 DIAGNOSIS — I480 Paroxysmal atrial fibrillation: Secondary | ICD-10-CM | POA: Insufficient documentation

## 2023-06-15 NOTE — Patient Instructions (Signed)

## 2023-06-26 ENCOUNTER — Encounter: Payer: Self-pay | Admitting: Cardiology

## 2023-06-26 ENCOUNTER — Encounter: Payer: Self-pay | Admitting: *Deleted

## 2023-09-04 ENCOUNTER — Encounter: Payer: Self-pay | Admitting: Sports Medicine

## 2023-09-05 ENCOUNTER — Ambulatory Visit (INDEPENDENT_AMBULATORY_CARE_PROVIDER_SITE_OTHER): Payer: Medicare Other | Admitting: Sports Medicine

## 2023-09-05 ENCOUNTER — Encounter: Payer: Self-pay | Admitting: Sports Medicine

## 2023-09-05 VITALS — BP 128/88 | HR 71 | Temp 97.1°F | Resp 18 | Ht 60.0 in | Wt 113.4 lb

## 2023-09-05 DIAGNOSIS — Z780 Asymptomatic menopausal state: Secondary | ICD-10-CM

## 2023-09-05 DIAGNOSIS — R5383 Other fatigue: Secondary | ICD-10-CM

## 2023-09-05 DIAGNOSIS — D649 Anemia, unspecified: Secondary | ICD-10-CM

## 2023-09-05 DIAGNOSIS — I10 Essential (primary) hypertension: Secondary | ICD-10-CM

## 2023-09-05 DIAGNOSIS — F32A Depression, unspecified: Secondary | ICD-10-CM

## 2023-09-05 DIAGNOSIS — R0602 Shortness of breath: Secondary | ICD-10-CM | POA: Diagnosis not present

## 2023-09-05 NOTE — Progress Notes (Signed)
Careteam: Patient Care Team: Venita Sheffield, MD as PCP - General (Internal Medicine) [place], Peter M, MD as PCP - Cardiology (Cardiology) Marisue Brooklyn, MD as Referring Physician  PLACE OF SERVICE:  [facility]  Advanced Directive information    Allergies  Allergen Reactions   Codeine Nausea And Vomiting   Cyclobenzaprine Other (See Comments)    Unknown reaction   Iodinated Contrast Media Nausea Only   Prednisone Other (See Comments)    "Sores in the mouth"    Chief Complaint  Patient presents with   Medical Management of Chronic Issues    3 month follow up.    Health Maintenance    Discuss the need for Dexa scan.      Discussed the use of AI scribe software for clinical note transcription with the patient, who gave verbal consent to proceed.  History of Present Illness   Barbara Mcconnell is an 89 year old female with spinal stenosis and arthritis who presents with chronic neck pain.   She experiences chronic neck pain due to spinal stenosis and arthritis. The pain is constant with variable intensity. She prefers non-pharmacological management due to concerns about medication side effects, using a heating pad for relief. She has tried physical therapy for mobility and balance, though not specifically for her neck. A roller ball provided temporary relief but caused skin irritation.  She has chronic breathing difficulties and low energy levels, impacting her daily activities. She denies chest or stomach pain, recent falls, and pain during urination. She mentions needing a 'little bump' for her breathing and attributes her low energy to aging.  She feels dizzy after taking her medications. No current depression, but she mentions not being happy and having intermittent sleep issues. She is on Lexapro 10 mg for mood but is hesitant to increase the dose. She sleeps well at night but takes daytime naps.  She has a history of anemia, managed with iron supplementation  three times a week. Her daughter   concerned about declining energy levels.         Review of Systems:  Review of Systems  Constitutional:  Positive for malaise/fatigue. Negative for chills and fever.  HENT:  Negative for congestion and sore throat.   Eyes:  Negative for double vision.  Respiratory:  Negative for cough, sputum production and shortness of breath.   Cardiovascular:  Negative for chest pain, palpitations and leg swelling.  Gastrointestinal:  Negative for abdominal pain, heartburn and nausea.  Genitourinary:  Negative for dysuria, frequency and hematuria.  Musculoskeletal:  Positive for neck pain. Negative for falls and myalgias.  Neurological:  Negative for dizziness, sensory change and focal weakness.  Psychiatric/Behavioral:  Positive for depression. Negative for suicidal ideas.    Negative unless indicated in HPI.   Past Medical History:  Diagnosis Date   Allergies    Anxiety    panic attacks   Aortic stenosis    Arthritis    Bronchiectasis with (acute) exacerbation (HCC)    CAD (coronary artery disease)    non obst CAD   Depression due to acute stroke (HCC)    Dizziness    Elevated liver enzymes    Fall    Fluid overload    GERD (gastroesophageal reflux disease)    Heart murmur    Hemorrhoids    History of mammogram    Hyperlipemia    Hypertension    Internal carotid artery stenosis    OCD (obsessive compulsive disorder)    Orthostatic hypotension  Other cirrhosis of liver (HCC)    PAF (paroxysmal atrial fibrillation) (HCC)    Pericardial effusion    Physical deconditioning    Renal insufficiency    Rheumatic heart failure (congestive) (HCC)    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 Lynnette Caffey & Dr. Laneta Simmers   Seasonal allergies    Severe aortic stenosis    Stroke [facility])    Takotsubo cardiomyopathy    Past Surgical History:  Procedure Laterality Date    COLONOSCOPY     EYE SURGERY Bilateral    cataracts removed   INTRAOPERATIVE TRANSTHORACIC ECHOCARDIOGRAM N/A 08/03/2021   Procedure: INTRAOPERATIVE TRANSTHORACIC ECHOCARDIOGRAM;  Surgeon: Orbie Pyo, MD;  Location: [facility] INVASIVE CV LAB;  Service: Open Heart Surgery;  Laterality: N/A;   IR CT HEAD LTD  03/25/2022   IR PERCUTANEOUS ART THROMBECTOMY/INFUSION INTRACRANIAL INC DIAG ANGIO  03/25/2022   IR US GUIDE VASC ACCESS RIGHT  03/25/2022   PERICARDIOCENTESIS N/A 07/28/2021   Procedure: PERICARDIOCENTESIS;  Surgeon: Orbie Pyo, MD;  Location: [facility] INVASIVE CV LAB;  Service: Cardiovascular;  Laterality: N/A;   RADIOLOGY WITH ANESTHESIA N/A 03/25/2022   Procedure: IR WITH ANESTHESIA;  Surgeon: Radiologist, Medication, MD;  Location: [facility] OR;  Service: Radiology;  Laterality: N/A;   RIGHT/LEFT HEART CATH AND CORONARY ANGIOGRAPHY N/A 05/13/2019   Procedure: RIGHT/LEFT HEART CATH AND CORONARY ANGIOGRAPHY;  Surgeon: [place], Peter M, MD;  Location: [facility] INVASIVE CV LAB;  Service: Cardiovascular;  Laterality: N/A;   RIGHT/LEFT HEART CATH AND CORONARY ANGIOGRAPHY N/A 07/23/2021   Procedure: RIGHT/LEFT HEART CATH AND CORONARY ANGIOGRAPHY;  Surgeon: Corky Crafts, MD;  Location: [facility] INVASIVE CV LAB;  Service: Cardiovascular;  Laterality: N/A;   TONSILLECTOMY     TRANSCATHETER AORTIC VALVE REPLACEMENT, TRANSFEMORAL N/A 08/03/2021   Procedure: TRANSCATHETER AORTIC VALVE REPLACEMENT, TRANSFEMORAL;  Surgeon: Orbie Pyo, MD;  Location: [facility] INVASIVE CV LAB;  Service: Open Heart Surgery;  Laterality: N/A;   UPPER GI ENDOSCOPY     Social History:   reports that she has quit smoking. Her smoking use included cigarettes. She has a 20 pack-year smoking history. She has never used smokeless tobacco. She reports that she does not currently use alcohol. She reports that she does not use drugs.  Family History  Problem Relation Age of Onset   Cancer Sister    Lung cancer Sister    Alcohol abuse Sister     Melanoma Brother    Brain cancer Brother    Heart failure Brother    Diabetes Brother    Cancer Brother    Heart Problems Brother    Down syndrome Brother    Heart failure Brother    Endometriosis Daughter    Hypertension Other     Medications: Patient's Medications  New Prescriptions   No medications on file  Previous Medications   ACETAMINOPHEN (TYLENOL) 500 MG TABLET    Take 500-1,000 mg by mouth every 6 (six) hours as needed for moderate pain.   ACID REDUCER 10 MG TABLET    TAKE (1) TABLET BY MOUTH TWICE DAILY FOR GERD.   ALBUTEROL (VENTOLIN HFA) 108 (90 BASE) MCG/ACT INHALER    Inhale 2 puffs into the lungs every 4 (four) hours as needed for wheezing or shortness of breath (seasonal allergies).   DOCUSATE SODIUM (COLACE) 100 MG CAPSULE    TAKE (1) CAPSULE BY MOUTH ONCE DAILY FOR CONSTIPATION.   ESCITALOPRAM (LEXAPRO)  10 MG TABLET    TAKE (1) TABLET BY MOUTH ONCE DAILY.   FERROUS SULFATE 325 (65 FE) MG EC TABLET    Take 325 mg by mouth 3 (three) times daily.   FEXOFENADINE (ALLEGRA) 60 MG TABLET    TAKE (1) TABLET BY MOUTH ONCE EVERY MORNING.   FUROSEMIDE (LASIX) 20 MG TABLET    TAKE (1/2) TABLET (10MG ) BY MOUTH ONCE DAILY IN THE MORNING.   LISINOPRIL (ZESTRIL) 10 MG TABLET    Take 1 tablet (10 mg total) by mouth 2 (two) times daily.   MONTELUKAST (SINGULAIR) 10 MG TABLET    TAKE (1) TABLET BY MOUTH ONCE DAILY.   MULTIPLE VITAMINS-MINERALS (EMERGEN-C VITAMIN C PO)    Take 1 packet by mouth daily. Mix with water   NEBIVOLOL (BYSTOLIC) 2.5 MG TABLET    Take 1 tablet (2.5 mg total) by mouth daily.   OXYMETAZOLINE (AFRIN) 0.05 % NASAL SPRAY    Place 1 spray into both nostrils as needed for congestion.   POLYETHYLENE GLYCOL POWDER (GLYCOLAX/MIRALAX) 17 GM/SCOOP POWDER    Take 17 g by mouth daily as needed for moderate constipation. Hold for loose stool   RIVAROXABAN (XARELTO) 15 MG TABS TABLET    TAKE (1) TABLET BY MOUTH ONCE DAILY.   ROSUVASTATIN (CRESTOR) 20 MG TABLET    TAKE (1)  TABLET BY MOUTH AT BEDTIME.   TROLAMINE SALICYLATE (ASPERCREME EX)    Apply 1 application topically daily as needed (knee pain).  Modified Medications   No medications on file  Discontinued Medications   No medications on file    Physical Exam: Vitals:   09/05/23 0954  BP: 128/88  Pulse: 71  Resp: 18  Temp: (!) 97.1 F (36.2 C)  SpO2: 94%  Weight: 113 lb 6.4 oz (51.4 kg)  Height: 5' (1.524 m)   Body mass index is 22.15 kg/m. BP Readings from Last 3 Encounters:  09/05/23 128/88  06/15/23 (!) 160/60  06/01/23 122/68   Wt Readings from Last 3 Encounters:  09/05/23 113 lb 6.4 oz (51.4 kg)  06/15/23 112 lb (50.8 kg)  06/01/23 114 lb 6.4 oz (51.9 kg)    Physical Exam Constitutional:      Appearance: Normal appearance.  HENT:     Head: Normocephalic and atraumatic.  Cardiovascular:     Rate and Rhythm: Normal rate and regular rhythm.     Heart sounds: Murmur heard.  Pulmonary:     Effort: Pulmonary effort is normal. No respiratory distress.     Breath sounds: Normal breath sounds. No wheezing.  Abdominal:     General: Bowel sounds are normal. There is no distension.     Tenderness: There is no abdominal tenderness. There is no guarding or rebound.     Comments:    Musculoskeletal:        General: No swelling or tenderness.     Comments: Neck - no tenderness Som slightly restricted due to pain  Neurological:     Mental Status: She is alert. Mental status is at baseline.     Sensory: No sensory deficit.     Motor: No weakness.     Labs reviewed: Basic Metabolic Panel: Recent Labs    03/07/23 1922 03/15/23 0000 04/18/23 1354 06/01/23 1034  NA 128* 131* 134* 131*  K 4.0 4.3 4.8 4.4  CL 95* 96* 98 94*  CO2 25 27* 29 32  GLUCOSE 97  --  90 93  BUN 15 17 15 15   CREATININE 0.94  1.1 0.93 1.07*  CALCIUM 9.1 9.6 9.7 9.7  TSH  --   --   --  0.88   Liver Function Tests: Recent Labs    11/09/22 1644  AST 53*  ALT 22  BILITOT 1.1  PROT 6.7   No results  for input(s): "LIPASE", "AMYLASE" in the last 8760 hours. No results for input(s): "AMMONIA" in the last 8760 hours. CBC: Recent Labs    02/15/23 1614 03/07/23 1922 04/18/23 1354  WBC 6.9 7.3 6.3  HGB 10.1* 9.9* 10.3*  HCT 31.7* 31.1* 32.6*  MCV 90.8 90.4 91.1  PLT 278 260 256   Lipid Panel: Recent Labs    06/01/23 1034  CHOL 200*  HDL 93  LDLCALC 82  TRIG 145  CHOLHDL 2.2   TSH: Recent Labs    06/01/23 1034  TSH 0.88   A1C: Lab Results  Component Value Date   HGBA1C 5.4 03/26/2022    Assessment and Plan    Cervical Spondylosis Chronic neck pain, severe arthritis noted on MRI. Limited options due to patient's preference to avoid medications and physical therapy. -Continue use of heating pad for pain relief.  Chronic Fatigue Reports of low energy and excessive daytime sleepiness. Multiple potential contributing factors including depression, aging, and possible anemia. Will check cbc, bmp  Depression Reports of low mood  but denies suicidal ideation. Currently on Lexapro 10mg  daily, but declined increase in dosage. -Continue current dose of Lexapro 10mg  daily.  Hypertension Blood pressure well-controlled on current regimen. -Continue Lisinopril for blood pressure control.  Anemia Possible anemia contributing to fatigue, currently on iron supplementation three times a week. Will check cbc  General Health Maintenance -Check kidney function in lab work.          Return in about 3 months (around 12/03/2023) for SCHEDULE FOR [facility].:

## 2023-09-07 ENCOUNTER — Encounter: Payer: Self-pay | Admitting: Sports Medicine

## 2023-09-07 ENCOUNTER — Other Ambulatory Visit: Payer: Medicare Other

## 2023-09-08 LAB — BASIC METABOLIC PANEL WITH GFR
BUN/Creatinine Ratio: 18 (calc) (ref 6–22)
BUN: 18 mg/dL (ref 7–25)
CO2: 23 mmol/L (ref 20–32)
Calcium: 9.6 mg/dL (ref 8.6–10.4)
Chloride: 96 mmol/L — ABNORMAL LOW (ref 98–110)
Creat: 1.02 mg/dL — ABNORMAL HIGH (ref 0.60–0.95)
Glucose, Bld: 109 mg/dL (ref 65–139)
Potassium: 4.7 mmol/L (ref 3.5–5.3)
Sodium: 133 mmol/L — ABNORMAL LOW (ref 135–146)
eGFR: 54 mL/min/{1.73_m2} — ABNORMAL LOW (ref >=60)

## 2023-09-08 LAB — CBC
HCT: 34.1 % — ABNORMAL LOW (ref 35.0–45.0)
Hemoglobin: 11 g/dL — ABNORMAL LOW (ref 11.7–15.5)
MCH: 29.3 pg (ref 27.0–33.0)
MCHC: 32.3 g/dL (ref 32.0–36.0)
MCV: 90.9 fL (ref 80.0–100.0)
MPV: 12.1 fL (ref 7.5–12.5)
Platelets: 247 10*3/uL (ref 140–400)
RBC: 3.75 10*6/uL — ABNORMAL LOW (ref 3.80–5.10)
RDW: 12.4 % (ref 11.0–15.0)
WBC: 7.2 10*3/uL (ref 3.8–10.8)

## 2023-09-08 IMAGING — CT CT CHEST W/ CM
2 of 7 series · 10 of 36 positions shown, 13 images · IV contrast (omnipaque)
Comparison: 07/18/2021

CLINICAL DATA: Thoracic aorta disease, post-op; Aneurysm, pelvis or
lower extremity. Aneurysm, pelvis or lower extremity. Severe
symptomatic aortic stenosis.

EXAM:
CT ANGIOGRAPHY CHEST, ABDOMEN AND PELVIS
TECHNIQUE: Multidetector CT imaging through the chest, abdomen and pelvis was
performed using the standard protocol during bolus administration of
intravenous contrast. Multiplanar reconstructed images and MIPs were
obtained and reviewed to evaluate the vascular anatomy.
CONTRAST:  100mL OMNIPAQUE IOHEXOL 350 MG/ML SOLN

[Series 3: ax thins · axial · 0.59mm/px · z∈[+743,+1336]mm · 9 of 669 slices shown, 12 images]
[im 38/669  mediastinal]
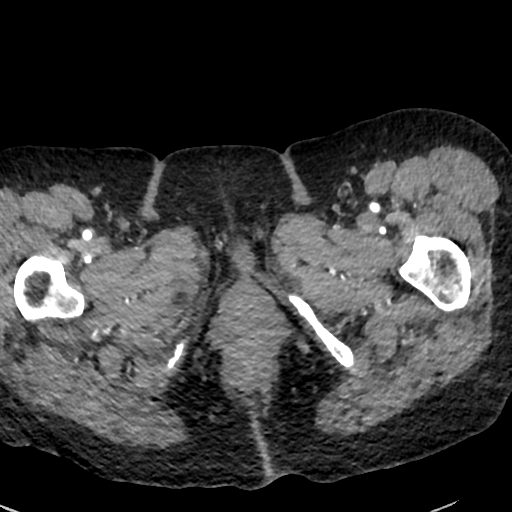
[im 38/669  lung]
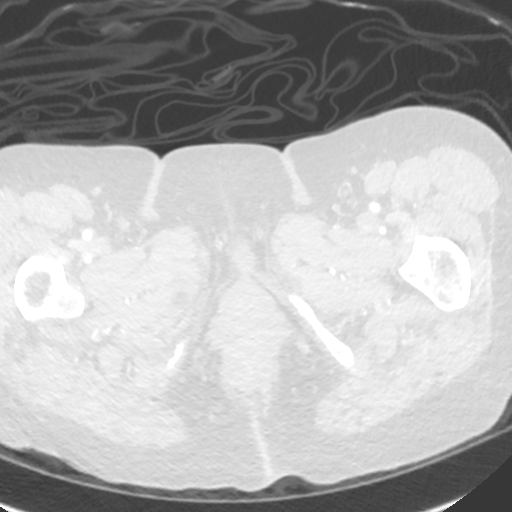
[im 112/669  lung]
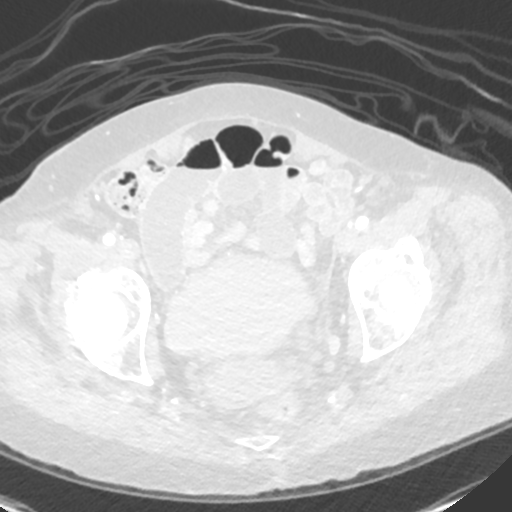
[im 186/669  lung]
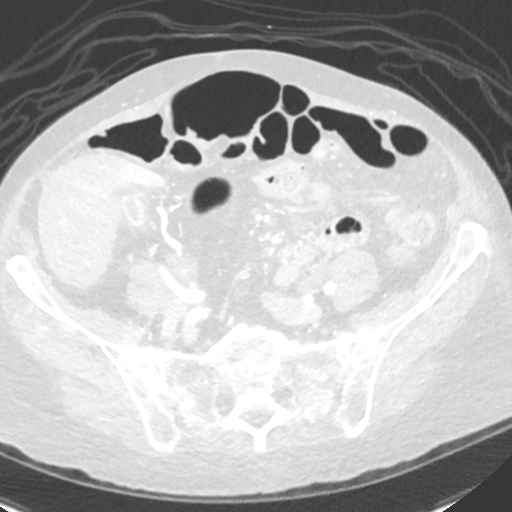
[im 260/669  lung]
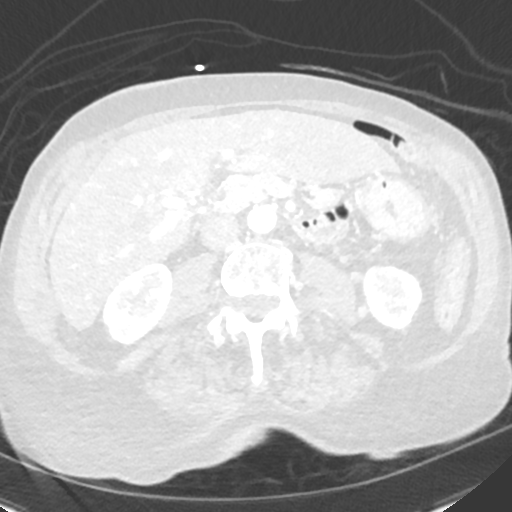
[im 335/669  mediastinal]
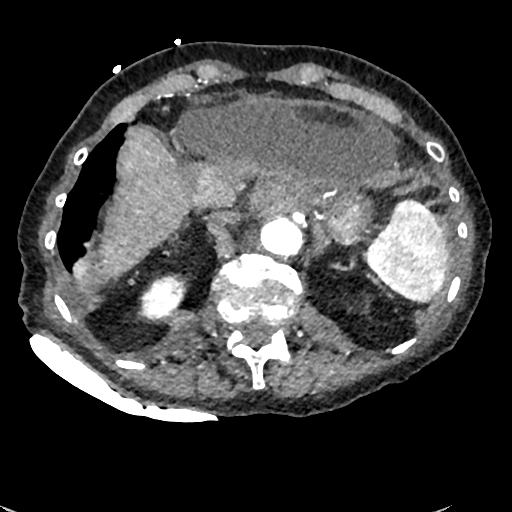
[im 335/669  lung]
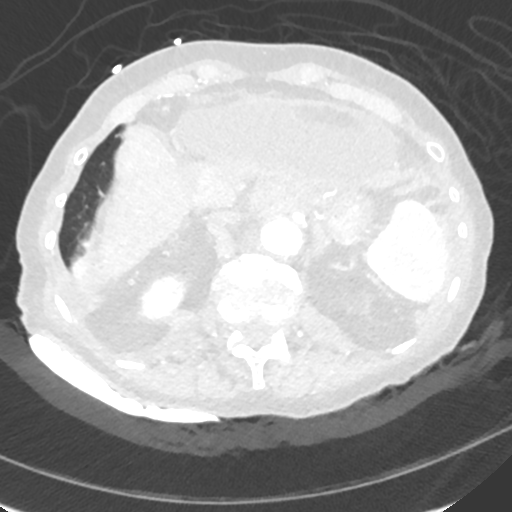
[im 409/669  lung]
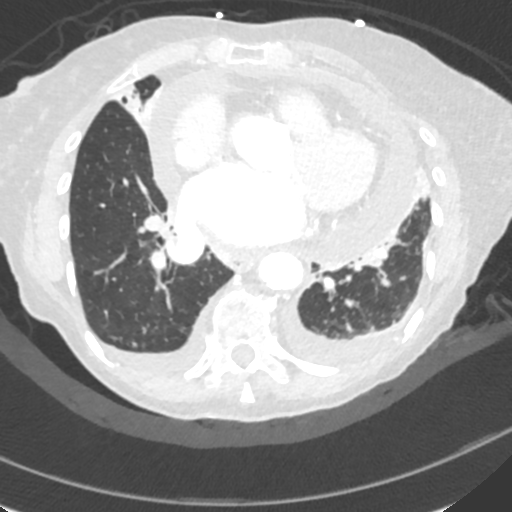
[im 483/669  lung]
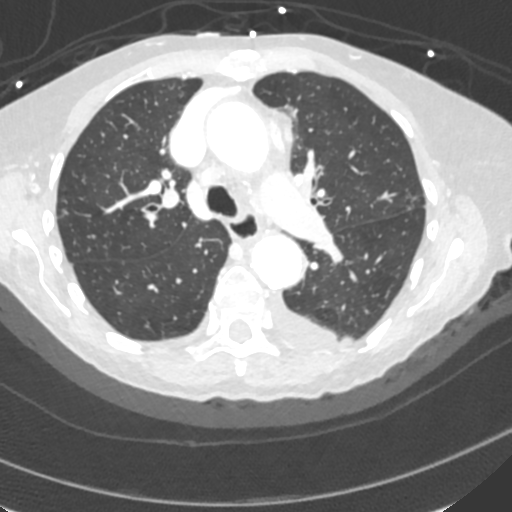
[im 557/669  lung]
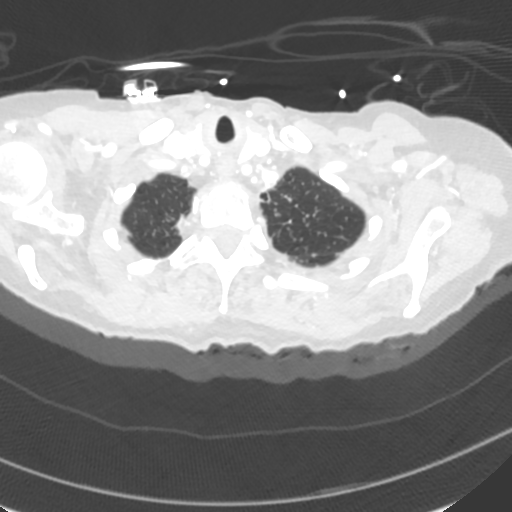
[im 631/669  mediastinal]
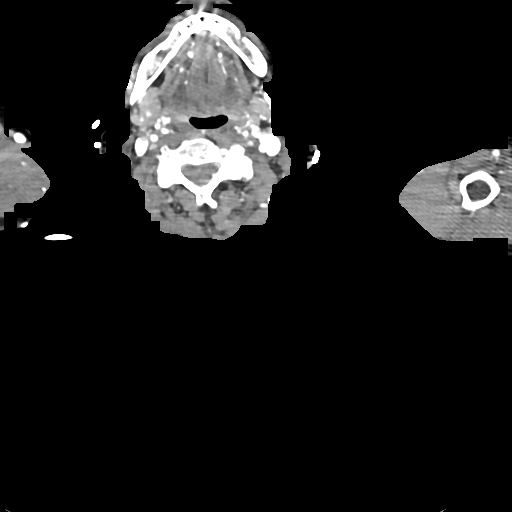
[im 631/669  lung]
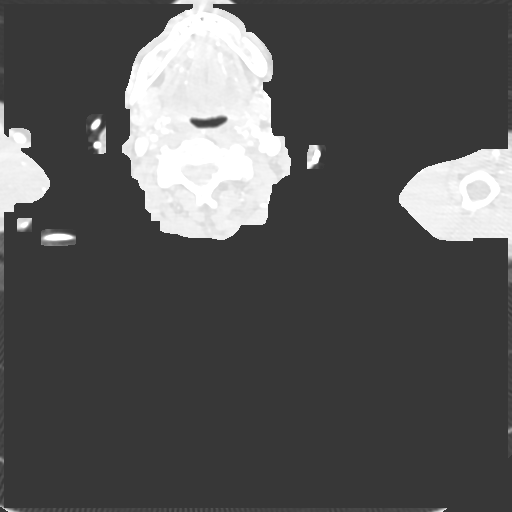

[Series 6: cor · coronal · 0.64mm/px · 1 of 151 slices shown]
[im 76/151  lung]
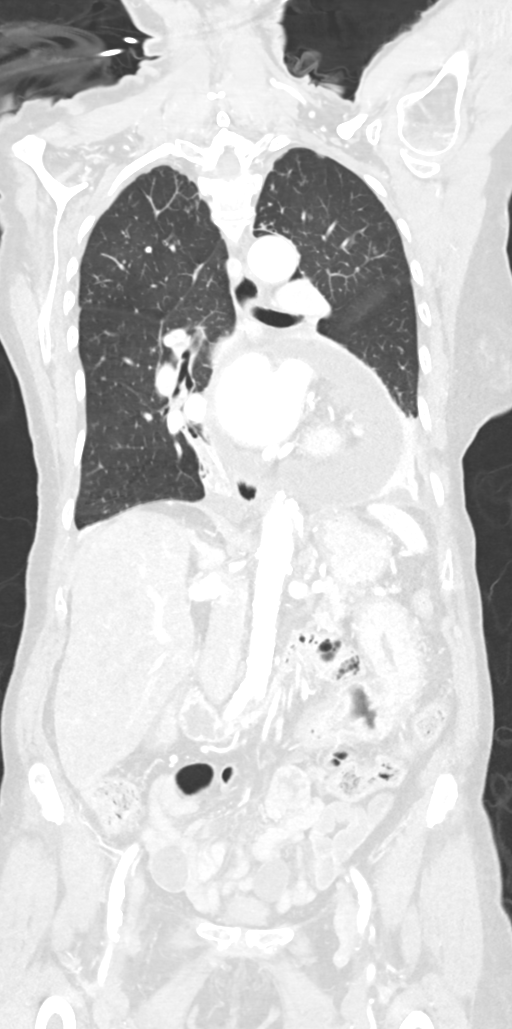

[10 of 36 positions shown; findings below may reference images not displayed]

FINDINGS: CTA CHEST FINDINGS

Cardiovascular: The thoracic aorta is of normal caliber, measuring
3.6 x 3.7 cm in its ascending segment, 2.9 x 2.6 cm within the arch
just beyond the takeoff of the left subclavian artery, and 2.6 x
cm within its descending segment at the level of the left atrium. No
intramural hematoma or dissection. Moderate calcified
atherosclerotic plaque.

Conventional arch vessel anatomic configuration. Less than 50%
stenosis of the left subclavian artery at its origin. Extensive,
irregular mixed atherosclerotic plaque at the right carotid
bifurcation results in a greater than 90% stenosis of the proximal
right internal carotid artery calcified atherosclerotic plaque at
the left carotid bifurcation results in a less than 50% stenosis the
proximal left internal carotid artery. Calcified atherosclerotic
plaque results in a 50% stenosis of the dominant left vertebral
artery at its origin.

Mild cardiomegaly with left ventricular hypertrophy noted. Extensive
atherosclerotic calcification of the aortic valve leaflets. The
aortic valve is trileaflet. Moderate coronary artery calcification
with probable stenting of the proximal right coronary artery.
Moderate pericardial effusion is present, enlarged in size when
compared to prior examination. Pericardial fluid appears slightly
hyperdense suggesting proteinaceous or hemorrhagic fluid. No CT
evidence of cardiac tamponade. The central pulmonary arteries are of
normal caliber.

Mediastinum/Nodes: No enlarged mediastinal, hilar, or axillary lymph
nodes. Thyroid gland, trachea, and esophagus demonstrate no
significant findings.

Lungs/Pleura: Small bilateral pleural effusions are present with
compressive atelectasis of the a lower lobes bilaterally, left
greater than right. There is cylindrical bronchiectasis and volume
loss noted within the lingula and basilar right middle lobe. Mild
peribronchial nodularity within these regions may reflect the
sequela of remote inflammation or a chronic atypical infectious
process as can be seen with mycobacterial or fungal infection. No
confluent pulmonary infiltrate. No pneumothorax. No central
obstructing lesion.

Musculoskeletal: No acute bone abnormality. No lytic or blastic bone
lesion.

Review of the MIP images confirms the above findings.

CTA ABDOMEN AND PELVIS FINDINGS

VASCULAR

Aorta: Normal caliber. No aneurysm or dissection. Moderate
atherosclerotic calcification. No periaortic inflammatory change.

Celiac: Less than 50% stenosis of the a celiac origin secondary to
calcified atherosclerotic plaque. Distally widely patent. No
aneurysm or dissection.

SMA: Patent without evidence of aneurysm, dissection, vasculitis or
significant stenosis.

Renals: Single renal arteries bilaterally. Less than 50% stenosis of
the origin of the right renal artery. Widely patent left renal
artery. Both arteries demonstrate a a beaded appearance within there
mid segments in keeping with changes of fibromuscular dysplasia. No
aneurysm or dissection.

IMA: 50-75% stenosis of the origin.  Distally widely patent.

Inflow: Mild atherosclerotic calcification. No evidence of
hemodynamically significant stenosis. No aneurysm or dissection.
Common iliac arteries measure 8-9 mm in diameter bilaterally.
External iliac arteries measure 6 mm in diameter bilaterally. Common
femoral arteries measure 7 mm in diameter bilaterally.

Veins: No obvious venous abnormality within the limitations of this
arterial phase study.

Review of the MIP images confirms the above findings.

NON-VASCULAR

Hepatobiliary: Geographic region of hyperenhancement within segment
2 of the liver may relate to underlying arterial portal shunting
with a resultant transient hepatic attenuation difference (DAWETI).
Similar region is seen within segment 4B of the liver. There are at
least 3 arterial enhancing rounded lesions within segment 4 and
segment 8 of the liver measuring up to 8 mm (image # 137/4) which
are nonspecific but may represent small flash filled hemangioma. The
liver is otherwise unremarkable. Gallbladder unremarkable. No intra
or extrahepatic biliary ductal dilation.

Pancreas: Unremarkable

Spleen: Unremarkable

Adrenals/Urinary Tract: The adrenal glands are unremarkable. The
kidneys are normal in position. Moderate left renal asymmetric
cortical atrophy. No enhancing intrarenal masses. No hydronephrosis.
No intrarenal or ureteral calculi. The bladder is unremarkable.

Stomach/Bowel: Stomach is within normal limits. Appendix appears
normal. No evidence of bowel wall thickening, distention, or
inflammatory changes.

Lymphatic: No pathologic adenopathy within the abdomen and pelvis.

Reproductive: Uterus and bilateral adnexa are unremarkable.

Other: Surgical dressing noted within the right inguinal region. No
abdominal wall hernia.

Musculoskeletal: Degenerative changes are noted within the lumbar
spine with grade 1 anterolisthesis of L4-5. No acute bone
abnormality. No lytic or blastic bone lesion.

Review of the MIP images confirms the above findings.
IMPRESSION: No evidence of thoracoabdominal aortic aneurysm or dissection.

Peripheral vascular disease with greater than 90% stenosis of the
proximal right internal carotid artery. Plaque morphology appears
irregular and may be better assessed with dedicated carotid artery
Doppler sonography. 50% stenosis of the a dominant left vertebral
artery at its origin. 50-75% stenosis of the inferior mesenteric
artery at its origin, though this is of questionable clinical
significance given patency of the celiac axis and superior
mesenteric artery.

Morphologic changes in keeping with fibromuscular dysplasia
involving the renal arteries bilaterally.

Cardiomegaly with left ventricular hypertrophy. Extensive
calcification of the aortic valve leaflets. Enlarging, moderate
pericardial effusion without definite CT evidence of cardiac
tamponade. Note, however, small bilateral pleural effusions have
developed since prior examination and can be seen the setting of
progressive cardiogenic failure. Pericardial fluid appears slightly
hyperdense suggesting a proteinaceous or hemorrhagic component.

Cylindrical bronchiectasis and volume loss within the lingula and
basilar right middle lobe with associated peribronchial nodularity.
This may reflect the sequela of remote inflammation or reflect
changes of a chronic infectious process such as atypical
mycobacterial or fungal pneumonia.

Multiple subcentimeter arterially enhancing lesions within the liver
possibly representing small flash filled hemangioma. These are not
optimally characterized on this examination may be better assessed
with dedicated MRI examination if clinically indicated.

Aortic Atherosclerosis ([id number]).

## 2023-09-20 ENCOUNTER — Encounter: Payer: Self-pay | Admitting: Sports Medicine

## 2023-09-20 NOTE — Telephone Encounter (Signed)
 Dr.Veludandi please provide clarity on the recommendation for Ocean for Nausea, as I will be faxing this message to patients facility as an order

## 2023-09-21 NOTE — Telephone Encounter (Signed)
 Late Entry, this note was printed and faxed to patients facility [phone] yesterday at 5:13 pm

## 2023-11-24 ENCOUNTER — Other Ambulatory Visit: Payer: Self-pay | Admitting: Cardiology

## 2023-11-27 NOTE — Telephone Encounter (Signed)
 Prescription refill request for Xarelto  received.  Indication:afib Last office visit:11/24 Weight:51.4  kg Age:89 Scr:1.02  2/25 CrCl:32.13  ml/min  Prescription refilled

## 2023-12-05 ENCOUNTER — Ambulatory Visit (INDEPENDENT_AMBULATORY_CARE_PROVIDER_SITE_OTHER): Payer: Medicare Other | Admitting: Family

## 2023-12-05 ENCOUNTER — Encounter: Payer: Self-pay | Admitting: Family

## 2023-12-05 VITALS — BP 146/68 | HR 73 | Temp 97.3°F | Ht 60.0 in | Wt 110.0 lb

## 2023-12-05 DIAGNOSIS — Z0000 Encounter for general adult medical examination without abnormal findings: Secondary | ICD-10-CM

## 2023-12-05 NOTE — Progress Notes (Signed)
 Subjective:   Barbara Mcconnell is a 89 y.o. female who presents for Medicare Annual (Subsequent) preventive examination.  Visit Complete: In person  Patient Medicare AWV questionnaire was completed by the patient on 12/05/2023; I have confirmed that all information answered by patient is correct and no changes since this date.  Cardiac Risk Factors include: advanced age (>60men, >39 women);smoking/ tobacco exposure;hypertension     Objective:    Today's Vitals   12/05/23 1307 12/05/23 1339  BP: (!) 146/68   Pulse: 73   Temp: (!) 97.3 F (36.3 C)   TempSrc: Temporal   SpO2: 92%   Weight: 110 lb (49.9 kg)   Height: 5' (1.524 m)   PainSc:  6    Body mass index is 21.48 kg/m.     12/05/2023    1:57 PM 04/18/2023    1:15 PM 04/18/2023   12:21 PM 03/07/2023    6:54 PM 02/15/2023    3:05 PM 09/26/2022    9:02 AM 06/28/2022    1:36 PM  Advanced Directives  Does Patient Have a Medical Advance Directive? Yes Yes Yes Yes Yes Yes Yes  Type of Estate agent of [place];Living will Healthcare Power of [place];Living will;Out of facility DNR (pink MOST or yellow form) Out of facility DNR (pink MOST or yellow form);Living will;Healthcare Power of [employer] of [place];Living will;Out of facility DNR (pink MOST or yellow form) Healthcare Power of [place];Living will Healthcare Power of [place];Living will  Does patient want to make changes to medical advance directive? No - Patient declined No - Patient declined No - Patient declined  No - Patient declined No - Patient declined No - Patient declined  Copy of Healthcare Power of Attorney in Chart? Yes - validated most recent copy scanned in chart (See row information) Yes - validated most recent copy scanned in chart (See row information) Yes - validated most recent copy scanned in chart (See row information)  Yes - validated most recent copy scanned in chart (See row information) Yes - validated most recent copy  scanned in chart (See row information) Yes - validated most recent copy scanned in chart (See row information)  Would patient like information on creating a medical advance directive? No - Patient declined        Pre-existing out of facility DNR order (yellow form or pink MOST form)   Yellow form placed in chart (order not valid for inpatient use)        Current Medications (verified) Outpatient Encounter Medications as of 12/05/2023  Medication Sig   acetaminophen  (TYLENOL ) 500 MG tablet Take 500-1,000 mg by mouth every 6 (six) hours as needed for moderate pain.   ACID REDUCER 10 MG tablet TAKE (1) TABLET BY MOUTH TWICE DAILY FOR GERD.   docusate sodium  (COLACE) 100 MG capsule TAKE (1) CAPSULE BY MOUTH ONCE DAILY FOR CONSTIPATION.   escitalopram  (LEXAPRO ) 10 MG tablet TAKE (1) TABLET BY MOUTH ONCE DAILY.   ferrous sulfate  325 (65 FE) MG EC tablet Take 325 mg by mouth 3 (three) times daily.   fexofenadine  (ALLEGRA ) 60 MG tablet TAKE (1) TABLET BY MOUTH ONCE EVERY MORNING.   furosemide  (LASIX ) 20 MG tablet TAKE (1/2) TABLET (10MG ) BY MOUTH ONCE DAILY IN THE MORNING.   lisinopril  (ZESTRIL ) 10 MG tablet Take 1 tablet (10 mg total) by mouth 2 (two) times daily.   montelukast  (SINGULAIR ) 10 MG tablet TAKE (1) TABLET BY MOUTH ONCE DAILY.   Multiple Vitamins-Minerals (EMERGEN-C VITAMIN C  PO) Take 1 packet by mouth daily. Mix with water   nebivolol  (BYSTOLIC ) 2.5 MG tablet Take 1 tablet (2.5 mg total) by mouth daily.   oxymetazoline  (AFRIN) 0.05 % nasal spray Place 1 spray into both nostrils as needed for congestion.   polyethylene glycol powder (GLYCOLAX /MIRALAX ) 17 GM/SCOOP powder Take 17 g by mouth daily as needed for moderate constipation. Hold for loose stool   rosuvastatin  (CRESTOR ) 20 MG tablet TAKE (1) TABLET BY MOUTH AT BEDTIME.   Trolamine Salicylate (ASPERCREME EX) Apply 1 application topically daily as needed (knee pain).   XARELTO  15 MG TABS tablet TAKE (1) TABLET BY MOUTH ONCE DAILY.    albuterol  (VENTOLIN  HFA) 108 (90 Base) MCG/ACT inhaler Inhale 2 puffs into the lungs every 4 (four) hours as needed for wheezing or shortness of breath (seasonal allergies). (Patient not taking: Reported on 12/05/2023)   No facility-administered encounter medications on file as of 12/05/2023.    Allergies (verified) Codeine, Cyclobenzaprine , Iodinated contrast media, and Prednisone    History: Past Medical History:  Diagnosis Date   Allergies    Anxiety    panic attacks   Aortic stenosis    Arthritis    Bronchiectasis with (acute) exacerbation (HCC)    CAD (coronary artery disease)    non obst CAD   Depression due to acute stroke (HCC)    Dizziness    Elevated liver enzymes    Fall    Fluid overload    GERD (gastroesophageal reflux disease)    Heart murmur    Hemorrhoids    History of mammogram    Hyperlipemia    Hypertension    Internal carotid artery stenosis    OCD (obsessive compulsive disorder)    Orthostatic hypotension    Other cirrhosis of liver (HCC)    PAF (paroxysmal atrial fibrillation) (HCC)    Pericardial effusion    Physical deconditioning    Renal insufficiency    Rheumatic heart failure (congestive) (HCC)    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 Lorie Rook & Dr. Sherene Dilling   Seasonal allergies    Severe aortic stenosis    Stroke [facility])    Takotsubo cardiomyopathy    Past Surgical History:  Procedure Laterality Date   COLONOSCOPY     EYE SURGERY Bilateral    cataracts removed   INTRAOPERATIVE TRANSTHORACIC ECHOCARDIOGRAM N/A 08/03/2021   Procedure: INTRAOPERATIVE TRANSTHORACIC ECHOCARDIOGRAM;  Surgeon: Thukkani, Arun K, MD;  Location: [facility] INVASIVE CV LAB;  Service: Open Heart Surgery;  Laterality: N/A;   IR CT HEAD LTD  03/25/2022   IR PERCUTANEOUS ART THROMBECTOMY/INFUSION INTRACRANIAL INC DIAG ANGIO  03/25/2022   IR US  GUIDE VASC ACCESS RIGHT  03/25/2022   PERICARDIOCENTESIS  N/A 07/28/2021   Procedure: PERICARDIOCENTESIS;  Surgeon: Kyra Phy, MD;  [facility] INVASIVE CV LAB;  Service: Cardiovascular;  Laterality: N/A;   RADIOLOGY WITH ANESTHESIA N/A 03/25/2022   Procedure: IR WITH ANESTHESIA;  Surgeon: Radiologist, Medication, MD;  Location: [facility] OR;  Service: Radiology;  Laterality: N/A;   RIGHT/LEFT HEART CATH AND CORONARY ANGIOGRAPHY N/A 05/13/2019   Procedure: RIGHT/LEFT HEART CATH AND CORONARY ANGIOGRAPHY;  Surgeon: Swazil[place], Peter M, MD;  Location: [facility] INVASIVE CV LAB;  Service: Cardiovascular;  Laterality: N/A;   RIGHT/LEFT HEART CATH AND CORONARY ANGIOGRAPHY N/A 07/23/2021   Procedure: RIGHT/LEFT HEART CATH AND CORONARY ANGIOGRAPHY;  Surgeon: Lucendia Rusk, MD;  Location: [facility] INVASIVE CV LAB;  Service:  Cardiovascular;  Laterality: N/A;   TONSILLECTOMY     TRANSCATHETER AORTIC VALVE REPLACEMENT, TRANSFEMORAL N/A 08/03/2021   Procedure: TRANSCATHETER AORTIC VALVE REPLACEMENT, TRANSFEMORAL;  Surgeon: Thukkani, Arun K, MD;  Location: [facility] INVASIVE CV LAB;  Service: Open Heart Surgery;  Laterality: N/A;   UPPER GI ENDOSCOPY     Family History  Problem Relation Age of Onset   Cancer Sister    Lung cancer Sister    Alcohol abuse Sister    Melanoma Brother    Brain cancer Brother    Heart failure Brother    Diabetes Brother    Cancer Brother    Heart Problems Brother    Down syndrome Brother    Heart failure Brother    Endometriosis Daughter    Hypertension Other    Social History   Socioeconomic History   Marital status: Divorced    Spouse name: Not on file   Number of children: Not on file   Years of education: Not on file   Highest education level: 12th grade  Occupational History   Not on file  Tobacco Use   Smoking status: Former    Current packs/day: 1.00    Average packs/day: 1 pack/day for 20.0 years (20.0 ttl pk-yrs)    Types: Cigarettes   Smokeless tobacco: Never   Tobacco comments:    Quit 30 yrs ago as of 07/2021 per daughter   Vaping Use   Vaping status: Never Used  Substance and Sexual Activity   Alcohol use: Not Currently    Comment: occasionally wine   Drug use: Never   Sexual activity: Not Currently    Birth control/protection: Post-menopausal  Other Topics Concern   Not on file  Social History Narrative   Tobacco use, amount per day now: None   Past tobacco use, amount per day: Up to 1 pack   How many years did you use tobacco: 20 years.   Alcohol use (drinks per week): None.   Diet: 3 meals daily/mostly cooked at home.   Do you drink/eat things with caffeine: periodic diet coke.   Marital status:  Divorced.                                What year were you married? 1968   Do you live in a house, apartment, assisted living, condo, trailer, etc.? One Level Townhome   Is it one or more stories? One Level   How many persons live in your home? One   Do you have pets in your home?( please list) No   Highest Level of education completed? High School   Current or past profession: Immunologist.    Do you exercise?   Yes when able.                               Type and how often? Silver Sneakers 2-3 times weekly.   Do you have a living will? Yes   Do you have a DNR form?        No                           If not, do you want to discuss one? Yes   Do you have signed POA/HPOA forms?   Yes  If so, please bring to you appointment      Do you have any difficulty bathing or dressing yourself? Yes   Do you have any difficulty preparing food or eating? Yes   Do you have any difficulty managing your medications? Yes   Do you have any difficulty managing your finances? No   Do you have any difficulty affording your medications? No   Social Drivers of Corporate investment banker Strain: Low Risk  (12/05/2023)   Overall Financial Resource Strain (CARDIA)    Difficulty of Paying Living Expenses: Not hard at all  Food Insecurity: No Food Insecurity (09/04/2023)   Hunger Vital  Sign    Worried About Running Out of Food in the Last Year: Never true    Ran Out of Food in the Last Year: Never true  Transportation Needs: No Transportation Needs (09/04/2023)   PRAPARE - Administrator, Civil Service (Medical): No    Lack of Transportation (Non-Medical): No  Physical Activity: Sufficiently Active (12/05/2023)   Exercise Vital Sign    Days of Exercise per Week: 3 days    Minutes of Exercise per Session: 60 min  Stress: No Stress Concern Present (12/05/2023)   [employer] of Occupational Health - Occupational Stress Questionnaire    Feeling of Stress : Not at all  Social Connections: Socially Integrated (12/05/2023)   Social Connection and Isolation Panel [NHANES]    Frequency of Communication with Friends and Family: More than three times a week    Frequency of Social Gatherings with Friends and Family: More than three times a week    Attends Religious Services: More than 4 times per year    Active Member of [employer] or Organizations: Yes    Attends Engineer, structural: More than 4 times per year    Marital Status: Married    Tobacco Counseling Counseling given: Not Answered Tobacco comments: Quit 30 yrs ago as of 07/2021 per daughter   Clinical Intake:  Pre-visit preparation completed: No  Pain : 0-10 Pain Score: 6  Pain Type: Chronic pain Pain Location: Neck Pain Orientation: Mid Pain Radiating Towards: upper back Pain Descriptors / Indicators: Aching Pain Onset: More than a month ago Pain Frequency: Constant Pain Relieving Factors: Neck collar Effect of Pain on Daily Activities: No  Pain Relieving Factors: Neck collar  BMI - recorded: 21.62 Nutritional Status: BMI of 19-24  Normal Nutritional Risks: None Diabetes: No  How often do you need to have someone help you when you read instructions, pamphlets, or other written materials from your doctor or pharmacy?: 1 - Never What is the last grade level you completed in school?:  12 grade  Interpreter Needed?: No      Activities of Daily Living    12/05/2023    1:24 PM 12/04/2023   11:02 AM  In your present state of health, do you have any difficulty performing the following activities:  Hearing? 0 0  Vision? 0 0  Difficulty concentrating or making decisions? 1 1  Walking or climbing stairs? 1 1  Dressing or bathing? 1 1  Doing errands, shopping? 1 1  Preparing Food and eating ? Colie Dawes  Comment Resides in Assisted Living   Using the Toilet? N N  In the past six months, have you accidently leaked urine? Y Y  Do you have problems with loss of bowel control? N N  Managing your Medications? Y Y  Managing your Finances? Colie Dawes  Comment daughter  assist   Housekeeping or managing your Housekeeping? Colie Dawes  Comment resides in ALF     Patient Care Team: Tye Gall, MD as PCP - General (Internal Medicine) [place], Peter M, MD as PCP - Cardiology (Cardiology) Moss Argyle, MD as Referring Physician  Indicate any recent Medical Services you may have received from other than [facility] providers in the past year (date may be approximate).     Assessment:   This is a routine wellness examination for [facility].  Hearing/Vision screen Hearing Screening - Comments:: Denies hearing difficulties   Vision Screening - Comments:: Wears rx glasses - up to date with routine eye exams with Dr Alicia Inoue    Goals Addressed   None   Depression Screen    12/05/2023    1:17 PM 09/05/2023   10:17 AM 09/05/2023    9:53 AM 09/26/2022    9:04 AM 06/02/2022    3:23 PM  PHQ 2/9 Scores  PHQ - 2 Score 0 1 0 0 3  PHQ- 9 Score     13  Exception Documentation    Other- indicate reason in comment box Medical reason  Not completed    annual wellness visit     Fall Risk    12/05/2023    1:24 PM 12/04/2023   11:02 AM 09/05/2023   10:17 AM 09/05/2023    9:53 AM 06/01/2023    9:46 AM  Fall Risk   Falls in the past year? 1 1 0 0 0  Number falls in past yr:  0 0 0 0  Injury with  Fall?  1 0 0 0  Risk for fall due to :    No Fall Risks No Fall Risks  Follow up    Falls evaluation completed Falls evaluation completed    MEDICARE RISK AT HOME: Medicare Risk at Home Any stairs in or around the home?: No If so, are there any without handrails?: No Home free of loose throw rugs in walkways, pet beds, electrical cords, etc?: Yes Adequate lighting in your home to reduce risk of falls?: Yes Life alert?: Yes Use of a cane, walker or w/c?: Yes Grab bars in the bathroom?: Yes Shower chair or bench in shower?: Yes Elevated toilet seat or a handicapped toilet?: No  TIMED UP AND GO:  Was the test performed?  Yes  Length of time to ambulate 10 feet: 12 sec Gait slow and steady with assistive device    Cognitive Function:        Immunizations Immunization History  Administered Date(s) Administered   Fluad Quad(high Dose 65+) 04/07/2019, 05/13/2022   Fluad Trivalent(High Dose 65+) 04/18/2023   Influenza Split 05/10/2006, 04/25/2007, 04/08/2008, 11/10/2008   Influenza Whole 04/25/2023   Influenza, High Dose Seasonal PF 04/20/2017, 04/07/2019, 03/25/2020, 04/27/2021   Influenza-Unspecified 05/10/2006, 04/25/2007, 04/08/2008, 07/09/2008, 11/10/2008, 03/31/2015, 04/20/2018, 04/07/2019   PFIZER Comirnaty Aaron AasGray Top)Covid-19 Tri-Sucrose Vaccine 08/14/2019, 09/04/2019, 04/20/2020, 10/25/2020, 04/12/2021   PPD Test 05/12/2022   Pfizer(Comirnaty )Fall Seasonal Vaccine 12 years and older 04/07/2023   Pneumococcal Conjugate-13 09/29/2015   Pneumococcal Polysaccharide-23 03/12/2007   Respiratory Syncytial Virus Vaccine ,Recomb Aduvanted(Arexvy ) 05/09/2023   Tdap 07/14/2014   Zoster Recombinant(Shingrix) 12/10/2019, 02/08/2020   Zoster, Live 08/18/2004, 01/17/2007    TDAP status: Up to date  Flu Vaccine status: Up to date  Pneumococcal vaccine status: Up to date  Covid-19 vaccine status: Information provided on how to obtain vaccines.   Qualifies for Shingles Vaccine?  Yes   Zostavax completed No   Shingrix Completed?: Yes  Screening Tests Health Maintenance  Topic Date Due   COVID-19 Vaccine (7 - 2024-25 season) 10/05/2023   DEXA SCAN  12/04/2024 (Originally 02/25/2002)   INFLUENZA VACCINE  02/23/2024   DTaP/Tdap/Td (2 - Td or Tdap) 07/14/2024   Medicare Annual Wellness (AWV)  12/04/2024   Pneumonia Vaccine 83+ Years old  Completed   Zoster Vaccines- Shingrix  Completed   HPV VACCINES  Aged Out   Meningococcal B Vaccine  Aged Out    Health Maintenance  Health Maintenance Due  Topic Date Due   COVID-19 Vaccine (7 - 2024-25 season) 10/05/2023    Colorectal cancer screening: No longer required.   Mammogram status: No longer required due to due to age .  Bone Density status: Ordered declined . Pt provided with contact info and advised to call to schedule appt.  Lung Cancer Screening: (Low Dose CT Chest recommended if Age 61-80 years, 20 pack-year currently smoking OR have quit w/in 15years.) does not qualify.   Lung Cancer Screening Referral: No   Additional Screening:  Hepatitis C Screening: does not qualify; Completed N/A   Vision Screening: Recommended annual ophthalmology exams for early detection of glaucoma and other disorders of the eye. Is the patient up to date with their annual eye exam?  Yes  Who is the provider or what is the name of the office in which the patient attends annual eye exams? [facility] If pt is not established with a provider, would they like to be referred to a provider to establish care? No .   Dental Screening: Recommended annual dental exams for proper oral hygiene  Diabetic Foot Exam: Diabetic Foot Exam: Completed N/A   Community Resource Referral / Chronic Care Management: CRR required this visit?  No   CCM required this visit?  No     Plan:     I have personally reviewed and noted the following in the patient's chart:   Medical and social history Use of alcohol, tobacco or illicit drugs  Current  medications and supplements including opioid prescriptions. Patient is currently taking opioid prescriptions. Information provided to patient regarding non-opioid alternatives. Patient advised to discuss non-opioid treatment plan with their provider. Functional ability and status Nutritional status Physical activity Advanced directives List of other physicians Hospitalizations, surgeries, and ER visits in previous 12 months Vitals Screenings to include cognitive, depression, and falls Referrals and appointments  In addition, I have reviewed and discussed with patient certain preventive protocols, quality metrics, and best practice recommendations. A written personalized care plan for preventive services as well as general preventive health recommendations were provided to patient.     Estil Heman, NP   12/05/2023   After Visit Summary: (In Person-Printed) AVS printed and given to the patient  Nurse Notes: - Declined Dexa scan

## 2023-12-05 NOTE — Patient Instructions (Signed)
 [name] , Thank you for taking time to come for your Medicare Wellness Visit. I appreciate your ongoing commitment to your health goals. Please review the following plan we discussed and let me know if I can assist you in the future.   Screening recommendations/referrals: Colonoscopy : N/A  Mammogram : Declined  Bone Density : Declined  Recommended yearly ophthalmology/optometry visit for glaucoma screening and checkup Recommended yearly dental visit for hygiene and checkup  Vaccinations: Influenza vaccine- due annually in September/October Pneumococcal vaccine : Up to date  Tdap vaccine : Up to date  Shingles vaccine : Up to date     Advanced directives: Yes   Conditions/risks identified:  advanced age (>9men, >31 women);smoking/ tobacco exposure;hypertension  Next appointment: 1 year    Preventive Care 36 Years and Older, Female Preventive care refers to lifestyle choices and visits with your health care provider that can promote health and wellness. What does preventive care include? A yearly physical exam. This is also called an annual well check. Dental exams once or twice a year. Routine eye exams. Ask your health care provider how often you should have your eyes checked. Personal lifestyle choices, including: Daily care of your teeth and gums. Regular physical activity. Eating a healthy diet. Avoiding tobacco and drug use. Limiting alcohol use. Practicing safe sex. Taking low-dose aspirin  every day. Taking vitamin and mineral supplements as recommended by your health care provider. What happens during an annual well check? The services and screenings done by your health care provider during your annual well check will depend on your age, overall health, lifestyle risk factors, and family history of disease. Counseling  Your health care provider may ask you questions about your: Alcohol use. Tobacco use. Drug use. Emotional well-being. Home and relationship  well-being. Sexual activity. Eating habits. History of falls. Memory and ability to understand (cognition). Work and work Astronomer. Reproductive health. Screening  You may have the following tests or measurements: Height, weight, and BMI. Blood pressure. Lipid and cholesterol levels. These may be checked every 5 years, or more frequently if you are over 16 years old. Skin check. Lung cancer screening. You may have this screening every year starting at age 62 if you have a 30-pack-year history of smoking and currently smoke or have quit within the past 15 years. Fecal occult blood test (FOBT) of the stool. You may have this test every year starting at age 31. Flexible sigmoidoscopy or colonoscopy. You may have a sigmoidoscopy every 5 years or a colonoscopy every 10 years starting at age 16. Hepatitis C blood test. Hepatitis B blood test. Sexually transmitted disease (STD) testing. Diabetes screening. This is done by checking your blood sugar (glucose) after you have not eaten for a while (fasting). You may have this done every 1-3 years. Bone density scan. This is done to screen for osteoporosis. You may have this done starting at age 10. Mammogram. This may be done every 1-2 years. Talk to your health care provider about how often you should have regular mammograms. Talk with your health care provider about your test results, treatment options, and if necessary, the need for more tests. Vaccines  Your health care provider may recommend certain vaccines, such as: Influenza vaccine. This is recommended every year. Tetanus, diphtheria, and acellular pertussis (Tdap, Td) vaccine. You may need a Td booster every 10 years. Zoster vaccine. You may need this after age 72. Pneumococcal 13-valent conjugate (PCV13) vaccine. One dose is recommended after age 31. Pneumococcal polysaccharide (PPSV23)  vaccine. One dose is recommended after age 57. Talk to your health care provider about which  screenings and vaccines you need and how often you need them. This information is not intended to replace advice given to you by your health care provider. Make sure you discuss any questions you have with your health care provider. Document Released: 08/07/2015 Document Revised: 03/30/2016 Document Reviewed: 05/12/2015 Elsevier Interactive Patient Education  2017 [employer].  Fall Prevention in the Home Falls can cause injuries. They can happen to people of all ages. There are many things you can do to make your home safe and to help prevent falls. What can I do on the outside of my home? Regularly fix the edges of walkways and driveways and fix any cracks. Remove anything that might make you trip as you walk through a door, such as a raised step or threshold. Trim any bushes or trees on the path to your home. Use bright outdoor lighting. Clear any walking paths of anything that might make someone trip, such as rocks or tools. Regularly check to see if handrails are loose or broken. Make sure that both sides of any steps have handrails. Any raised decks and porches should have guardrails on the edges. Have any leaves, snow, or ice cleared regularly. Use sand or salt on walking paths during winter. Clean up any spills in your garage right away. This includes oil or grease spills. What can I do in the bathroom? Use night lights. Install grab bars by the toilet and in the tub and shower. Do not use towel bars as grab bars. Use non-skid mats or decals in the tub or shower. If you need to sit down in the shower, use a plastic, non-slip stool. Keep the floor dry. Clean up any water that spills on the floor as soon as it happens. Remove soap buildup in the tub or shower regularly. Attach bath mats securely with double-sided non-slip rug tape. Do not have throw rugs and other things on the floor that can make you trip. What can I do in the bedroom? Use night lights. Make sure that you have a  light by your bed that is easy to reach. Do not use any sheets or blankets that are too big for your bed. They should not hang down onto the floor. Have a firm chair that has side arms. You can use this for support while you get dressed. Do not have throw rugs and other things on the floor that can make you trip. What can I do in the kitchen? Clean up any spills right away. Avoid walking on wet floors. Keep items that you use a lot in easy-to-reach places. If you need to reach something above you, use a strong step stool that has a grab bar. Keep electrical cords out of the way. Do not use floor polish or wax that makes floors slippery. If you must use wax, use non-skid floor wax. Do not have throw rugs and other things on the floor that can make you trip. What can I do with my stairs? Do not leave any items on the stairs. Make sure that there are handrails on both sides of the stairs and use them. Fix handrails that are broken or loose. Make sure that handrails are as long as the stairways. Check any carpeting to make sure that it is firmly attached to the stairs. Fix any carpet that is loose or worn. Avoid having throw rugs at the top or bottom  of the stairs. If you do have throw rugs, attach them to the floor with carpet tape. Make sure that you have a light switch at the top of the stairs and the bottom of the stairs. If you do not have them, ask someone to add them for you. What else can I do to help prevent falls? Wear shoes that: Do not have high heels. Have rubber bottoms. Are comfortable and fit you well. Are closed at the toe. Do not wear sandals. If you use a stepladder: Make sure that it is fully opened. Do not climb a closed stepladder. Make sure that both sides of the stepladder are locked into place. Ask someone to hold it for you, if possible. Clearly mark and make sure that you can see: Any grab bars or handrails. First and last steps. Where the edge of each step  is. Use tools that help you move around (mobility aids) if they are needed. These include: Canes. Walkers. Scooters. Crutches. Turn on the lights when you go into a dark area. Replace any light bulbs as soon as they burn out. Set up your furniture so you have a clear path. Avoid moving your furniture around. If any of your floors are uneven, fix them. If there are any pets around you, be aware of where they are. Review your medicines with your doctor. Some medicines can make you feel dizzy. This can increase your chance of falling. Ask your doctor what other things that you can do to help prevent falls. This information is not intended to replace advice given to you by your health care provider. Make sure you discuss any questions you have with your health care provider. Document Released: 05/07/2009 Document Revised: 12/17/2015 Document Reviewed: 08/15/2014 Elsevier Interactive Patient Education  2017 [employer].

## 2023-12-07 NOTE — Progress Notes (Signed)
 Cardiology Office Note:    Date:  12/12/2023   ID:  Barbara Mcconnell, DOB Jul 26, 1934, MRN [id number]  PCP:  Tye Gall, MD  Cardiologist:  Kuba Shepherd [place], MD  Electrophysiologist:  None   Referring MD: Tye Gall, *   Chief Complaint  Patient presents with   Shortness of Breath    History of Present Illness:    Barbara Mcconnell is a 89 y.o. female is seen for follow up aortic stenosis s/p TAVR and Takotsubo cardiomyopathy.  She had a cardiac event approximately 7 years ago in [facility], we have no records of that. According to the patient this was Takotsubos cardiomyopathy.   Other medical issues include hypertension, peripheral vascular disease with moderate carotid stenosis, and dyslipidemia with a history of statin intolerance.    The patient presented to the med center in [facility] 05/13/2019 with substernal chest pain and diaphoresis.  She ruled in for an MI.  Admitted to [facility] hospital. Catheterization was done which showed a 50% RCA otherwise normal coronaries and an ejection fraction of 30 to 35% with moderate AS. LV pattern was c/w Takotsubo's disease.  In the hospital she was somewhat hypotensive but this improved prior to discharge and she was put back on her lisinopril  but at a lower dose. She is also on low dose Toprol .  On repeat Echo in March 2021 her EF had normalized but there was moderate to severe Aortic stenosis and repeat in 6 months was recommended. Unfortunately she did not have follow up until she presented to [facility] 07/21/21-07/30/21 for acute dyspnea. Echo showed normal LVEF with a moderate circumfrential pericardial effusion measuring 1.5 cm. There was moderate AS with a mean gradient at (was 20.69mmhg), AVA 1.38cm and DI 0.29. She underwent [facility] 07/23/21 which showed stable, mild to moderate CAD with hemodynamics consistent with pulmonary hypertension. Due to moderate AS, she was initially felt to be a good PROGRESS trial candidate however with  discrepancies between echocardiogram findings and inability to cross AV, pre-TAVR CT imaging was obtained. These showed an AVA 1.01cm2, annular area measuring 423mm2, and large circumferential pericardial effusion. After further discussion, she opted to defer PROGRESS trail consideration and move forward with TAVR given progressive symptoms, LVH with small LV cavity and calcium  score at 2093. Her hospital stay was further complicated by new onset atrial fibrillation with RVR. She was placed on diltiazem  infusion with relatively quick conversion to NSR. She ultimately underwent pericardiocentesis 07/28/21 and drain placement. This was discontinued 07/29/21 and the patient was discharged from the hospital 07/30/21. During her admission, she was evaluated by our multidisciplinary valve team and plans were made for TAVR on 08/03/21.    She underwent successful TAVR with a 23 mm Edwards Sapien 3 Resilia via the TF approach on 08/03/21. Post operative echo showed LVEF at 65-70% with G1DD, mild MR, mild MS, trivial PVL in the right coronary ostium with mild MR with a mean gradient at 10.65mmHg. ECG with NSR and frequent PVCs however no high grade heart block. She was initially started on ASA 81mg  QD however this was changed to Eliquis  2.5mg  BID after discharge due to AF on ZIO monitor.  She was admitted 9/1-03/30/22 with acute left MCA stroke. She had difficulty with word finding and confusion. She was on Eliquis . Had IR intervention of M2 segment with apparent reocclusion of M3 segment. Echo was stable. CT showed severe stenosis at the origin of the RICA. No significant stenosis on the left. (Prior dopplers in Feb showed 50-69%  RICA stenosis). She was switched from Eliquis  to Pradaxa . DC to SNF.  While in hospital no Afib seen. She was initially at [facility] but has since been moved to [facility] assisted living. She did wear an Event monitor which showed some nonsustained atrial arrhythmias.   She developed elevated blood  pressure and was seen in the ER 03/07/2023.  SBP 170-200 range at home. Had been drifting up to 140-170 range.  BP on arrival 181/66.  EDP did not feel she needed emergent blood pressure lowering and discharged her without admission with close follow-up with cardiology. They increased her lisinopril  from 2.5 mg to 5 mg. Of note, Na was also 128, treated with IVF. She was having diarrhea over a few days. Was seen in follow up by Marcie Sever on 8/14, lisinopril  increased to 5 mg bid. Loud murmur noted and Echo ordered. This showed hyperdynamic LV function with gr 2 diastolic dysfunction. Normal TAVR function. Later lisinopril  increased to 20 mg daily for persistently elevated BP.  She is seen with her Daughter [name] today. Patient complains of feeling very fatigued. Notes a lot of posterior neck pain. More SOB in the last few months especially when lying down.  Doesn't want anything done to prolong life. Doesn't want additional meds. States she is ready for it all to end.  Weight is unchanged. No edema or cough.    Past Medical History:  Diagnosis Date   Allergies    Anxiety    panic attacks   Aortic stenosis    Arthritis    Bronchiectasis with (acute) exacerbation (HCC)    CAD (coronary artery disease)    non obst CAD   Depression due to acute stroke (HCC)    Dizziness    Elevated liver enzymes    Fall    Fluid overload    GERD (gastroesophageal reflux disease)    Heart murmur    Hemorrhoids    History of mammogram    Hyperlipemia    Hypertension    Internal carotid artery stenosis    OCD (obsessive compulsive disorder)    Orthostatic hypotension    Other cirrhosis of liver (HCC)    PAF (paroxysmal atrial fibrillation) (HCC)    Pericardial effusion    Physical deconditioning    Renal insufficiency    Rheumatic heart failure (congestive) (HCC)    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 Lorie Rook & Dr.  Sherene Dilling   Seasonal allergies    Severe aortic stenosis    Stroke [facility])    Takotsubo cardiomyopathy     Past Surgical History:  Procedure Laterality Date   COLONOSCOPY     EYE SURGERY Bilateral    cataracts removed   INTRAOPERATIVE TRANSTHORACIC ECHOCARDIOGRAM N/A 08/03/2021   Procedure: INTRAOPERATIVE TRANSTHORACIC ECHOCARDIOGRAM;  Surgeon: Thukkani, Arun K, MD;  Location: [facility] INVASIVE CV LAB;  Service: Open Heart Surgery;  Laterality: N/A;   IR CT HEAD LTD  03/25/2022   IR PERCUTANEOUS ART THROMBECTOMY/INFUSION INTRACRANIAL INC DIAG ANGIO  03/25/2022   IR US  GUIDE VASC ACCESS RIGHT  03/25/2022   PERICARDIOCENTESIS N/A 07/28/2021   Procedure: PERICARDIOCENTESIS;  Surgeon: Kyra Phy, MD;  Location: [facility] INVASIVE CV LAB;  Service: Cardiovascular;  Laterality: N/A;   RADIOLOGY WITH ANESTHESIA N/A 03/25/2022   Procedure: IR WITH ANESTHESIA;  Surgeon: Radiologist, Medication, MD;  Location: [facility] OR;  Service: Radiology;  Laterality: N/A;   RIGHT/LEFT HEART CATH  AND CORONARY ANGIOGRAPHY N/A 05/13/2019   Procedure: RIGHT/LEFT HEART CATH AND CORONARY ANGIOGRAPHY;  Surgeon: [place], Kamaiyah Uselton M, MD;  Location: [facility] INVASIVE CV LAB;  Service: Cardiovascular;  Laterality: N/A;   RIGHT/LEFT HEART CATH AND CORONARY ANGIOGRAPHY N/A 07/23/2021   Procedure: RIGHT/LEFT HEART CATH AND CORONARY ANGIOGRAPHY;  Surgeon: Lucendia Rusk, MD;  Location: [facility] INVASIVE CV LAB;  Service: Cardiovascular;  Laterality: N/A;   TONSILLECTOMY     TRANSCATHETER AORTIC VALVE REPLACEMENT, TRANSFEMORAL N/A 08/03/2021   Procedure: TRANSCATHETER AORTIC VALVE REPLACEMENT, TRANSFEMORAL;  Surgeon: Thukkani, Arun K, MD;  Location: [facility] INVASIVE CV LAB;  Service: Open Heart Surgery;  Laterality: N/A;   UPPER GI ENDOSCOPY      Current Medications: Current Meds  Medication Sig   acetaminophen  (TYLENOL ) 500 MG tablet Take 500-1,000 mg by mouth every 6 (six) hours as needed for moderate pain.   ACID REDUCER 10 MG tablet TAKE (1) TABLET BY  MOUTH TWICE DAILY FOR GERD.   albuterol  (VENTOLIN  HFA) 108 (90 Base) MCG/ACT inhaler Inhale 2 puffs into the lungs every 4 (four) hours as needed for wheezing or shortness of breath (seasonal allergies).   docusate sodium  (COLACE) 100 MG capsule TAKE (1) CAPSULE BY MOUTH ONCE DAILY FOR CONSTIPATION.   escitalopram  (LEXAPRO ) 10 MG tablet TAKE (1) TABLET BY MOUTH ONCE DAILY.   ferrous sulfate  325 (65 FE) MG EC tablet Take 325 mg by mouth 3 (three) times daily.   fexofenadine  (ALLEGRA ) 60 MG tablet TAKE (1) TABLET BY MOUTH ONCE EVERY MORNING.   furosemide  (LASIX ) 20 MG tablet TAKE (1/2) TABLET (10MG ) BY MOUTH ONCE DAILY IN THE MORNING.   lisinopril  (ZESTRIL ) 10 MG tablet Take 1 tablet (10 mg total) by mouth 2 (two) times daily.   montelukast  (SINGULAIR ) 10 MG tablet TAKE (1) TABLET BY MOUTH ONCE DAILY.   Multiple Vitamins-Minerals (EMERGEN-C VITAMIN C PO) Take 1 packet by mouth daily. Mix with water   nebivolol  (BYSTOLIC ) 2.5 MG tablet Take 1 tablet (2.5 mg total) by mouth daily.   oxymetazoline  (AFRIN) 0.05 % nasal spray Place 1 spray into both nostrils as needed for congestion.   polyethylene glycol powder (GLYCOLAX /MIRALAX ) 17 GM/SCOOP powder Take 17 g by mouth daily as needed for moderate constipation. Hold for loose stool   rosuvastatin  (CRESTOR ) 20 MG tablet TAKE (1) TABLET BY MOUTH AT BEDTIME.   Trolamine Salicylate (ASPERCREME EX) Apply 1 application topically daily as needed (knee pain).   XARELTO  15 MG TABS tablet TAKE (1) TABLET BY MOUTH ONCE DAILY.     Allergies:   Codeine, Cyclobenzaprine , Iodinated contrast media, and Prednisone    Social History   Socioeconomic History   Marital status: Divorced    Spouse name: Not on file   Number of children: Not on file   Years of education: Not on file   Highest education level: 12th grade  Occupational History   Not on file  Tobacco Use   Smoking status: Former    Current packs/day: 1.00    Average packs/day: 1 pack/day for 20.0 years  (20.0 ttl pk-yrs)    Types: Cigarettes   Smokeless tobacco: Never   Tobacco comments:    Quit 30 yrs ago as of 07/2021 per daughter  Vaping Use   Vaping status: Never Used  Substance and Sexual Activity   Alcohol use: Not Currently    Comment: occasionally wine   Drug use: Never   Sexual activity: Not Currently    Birth control/protection: Post-menopausal  Other Topics Concern   Not  on file  Social History Narrative   Tobacco use, amount per day now: None   Past tobacco use, amount per day: Up to 1 pack   How many years did you use tobacco: 20 years.   Alcohol use (drinks per week): None.   Diet: 3 meals daily/mostly cooked at home.   Do you drink/eat things with caffeine: periodic diet coke.   Marital status:  Divorced.                                What year were you married? 1968   Do you live in a house, apartment, assisted living, condo, trailer, etc.? One Level Townhome   Is it one or more stories? One Level   How many persons live in your home? One   Do you have pets in your home?( please list) No   Highest Level of education completed? High School   Current or past profession: Immunologist.    Do you exercise?   Yes when able.                               Type and how often? Silver Sneakers 2-3 times weekly.   Do you have a living will? Yes   Do you have a DNR form?        No                           If not, do you want to discuss one? Yes   Do you have signed POA/HPOA forms?   Yes                     If so, please bring to you appointment      Do you have any difficulty bathing or dressing yourself? Yes   Do you have any difficulty preparing food or eating? Yes   Do you have any difficulty managing your medications? Yes   Do you have any difficulty managing your finances? No   Do you have any difficulty affording your medications? No   Social Drivers of Corporate investment banker Strain: Low Risk  (12/05/2023)   Overall Financial Resource Strain  (CARDIA)    Difficulty of Paying Living Expenses: Not hard at all  Food Insecurity: No Food Insecurity (09/04/2023)   Hunger Vital Sign    Worried About Running Out of Food in the Last Year: Never true    Ran Out of Food in the Last Year: Never true  Transportation Needs: No Transportation Needs (09/04/2023)   PRAPARE - Administrator, Civil Service (Medical): No    Lack of Transportation (Non-Medical): No  Physical Activity: Sufficiently Active (12/05/2023)   Exercise Vital Sign    Days of Exercise per Week: 3 days    Minutes of Exercise per Session: 60 min  Stress: No Stress Concern Present (12/05/2023)   [employer] of Occupational Health - Occupational Stress Questionnaire    Feeling of Stress : Not at all  Social Connections: Socially Integrated (12/05/2023)   Social Connection and Isolation Panel [NHANES]    Frequency of Communication with Friends and Family: More than three times a week    Frequency of Social Gatherings with Friends and Family: More than three times a week    Attends Religious Services: More than  4 times per year    Active Member of Clubs or Organizations: Yes    Attends Engineer, structural: More than 4 times per year    Marital Status: Married     Family History: The patient's family history includes Alcohol abuse in her sister; Brain cancer in her brother; Cancer in her brother and sister; Diabetes in her brother; Down syndrome in her brother; Endometriosis in her daughter; Heart Problems in her brother; Heart failure in her brother and brother; Hypertension in an other family member; Lung cancer in her sister; Melanoma in her brother.  ROS:   Please see the history of present illness.     All other systems reviewed and are negative.  EKGs/Labs/Other Studies Reviewed:     EKG:  EKG is not ordered today.    Recent Labs: 06/01/2023: TSH 0.88 09/07/2023: BUN 18; Creat 1.02; Hemoglobin 11.0; Platelets 247; Potassium 4.7; Sodium 133   Recent Lipid Panel    Component Value Date/Time   CHOL 200 (H) 06/01/2023 1034   TRIG 145 06/01/2023 1034   HDL 93 06/01/2023 1034   CHOLHDL 2.2 06/01/2023 1034   VLDL 15 03/26/2022 0137   LDLCALC 82 06/01/2023 1034    Dated 02/15/23: creatinine 1.07. otherwise CMET normal. Hgb 10.1  Physical Exam:    VS:  BP 120/70   Pulse 70   Wt 112 lb 9.6 oz (51.1 kg)   SpO2 93%   BMI 21.99 kg/m     Wt Readings from Last 3 Encounters:  12/12/23 112 lb 9.6 oz (51.1 kg)  12/05/23 110 lb (49.9 kg)  09/05/23 113 lb 6.4 oz (51.4 kg)     GEN: Thin caucasian female, well developed in no acute distress HEENT: Normal NECK: No JVD LYMPHATICS: No lymphadenopathy CARDIAC: RRR, gr 2/6 diastolic murmur LSB, no rubs, gallops RESPIRATORY:  Clear to auscultation without rales, wheezing or rhonchi  ABDOMEN: Soft, non-tender, non-distended MUSCULOSKELETAL:  No edema; No deformity  SKIN: Warm and dry NEUROLOGIC:  Alert and oriented x 3. Some word finding difficulty.  PSYCHIATRIC:  Normal affect   Cardiac studies:  Echo 08/04/21:    1. Left ventricular ejection fraction, by estimation, is 65 to 70%. The  left ventricle has normal function. The left ventricle has no regional  wall motion abnormalities. There is mild left ventricular hypertrophy of  the basal-septal segment. Left  ventricular diastolic parameters are consistent with Grade I diastolic  dysfunction (impaired relaxation). Elevated left atrial pressure.   2. Right ventricular systolic function is normal. The right ventricular  size is normal. Tricuspid regurgitation signal is inadequate for assessing  PA pressure.   3. Left atrial size was mildly dilated.   4. The mitral valve is degenerative. Mild mitral valve regurgitation.  Mild mitral stenosis. The mean mitral valve gradient is 7.0 mmHg with  average heart rate of 93 bpm. Severe mitral annular calcification.   5. There is trivial perivalvular leak in the vicinity of the right   coronary ostium. The aortic valve has been repaired/replaced. Aortic valve  regurgitation is mild. There is a 23 mm Sapien prosthetic (TAVR) valve  present in the aortic position.  Procedure Date: 08/03/21. Aortic valve mean gradient measures 10.2 mmHg.  Aortic valve Vmax measures 2.22 m/s. Aortic valve acceleration time  measures 58 msec.   6. The inferior vena cava is dilated in size with <50% respiratory  variability, suggesting right atrial pressure of 15 mmHg.    ______________________   Echo 09/10/21 IMPRESSIONS  1. 23 mm S3 is present in the aortic position. There is abnormal color  flow in the region of the prosthesis, but this is forward flow based on  color doppler. There is no apparenent regurgitation or paravalvular leak.  Possibly, this represents flow  between the stent frame and aortic root. There is also a small color  signal below the annulus in the 8-10 o'clock position. Cannot exclude a  very small iatrogenic VSD. Vmax 2.1 m/s, MG 9.5 mmHG, EOA 2.42 cm2.  Hemodynamics are within limits. Would  consider TEE for characterization if clinically indicated. The aortic  valve has been repaired/replaced. Aortic valve regurgitation is not  visualized. There is a 23 mm Sapien prosthetic (TAVR) valve present in the  aortic position. Procedure Date:  08/03/2021.   2. Left ventricular ejection fraction, by estimation, is >75%. The left  ventricle has hyperdynamic function. The left ventricle has no regional  wall motion abnormalities. There is moderate concentric left ventricular  hypertrophy. Left ventricular  diastolic function could not be evaluated.   3. Right ventricular systolic function is normal. The right ventricular  size is normal. There is normal pulmonary artery systolic pressure. The  estimated right ventricular systolic pressure is 26.0 mmHg.   4. Left atrial size was mildly dilated.   5. The mitral valve is degenerative. No evidence of mitral valve   regurgitation. Moderate mitral annular calcification.   6. The inferior vena cava is normal in size with greater than 50%  respiratory variability, suggesting right atrial pressure of 3 mmHg.   Comparison(s): No significant change from prior study. AoV gradients  stable. Color jet described above that is abnormal.     Lucendia Rusk, MD (Primary)     Procedures  RIGHT/LEFT HEART CATH AND CORONARY ANGIOGRAPHY   Conclusion      Prox RCA lesion is 50% stenosed.   Prox Cx to Mid Cx lesion is 25% stenosed.   Prox LAD to Mid LAD lesion is 25% stenosed.   Hemodynamic findings consistent with pulmonary hypertension.   Stable, mild to moderate coronary artery disease.  No indication for PCI.   Unable to cross aortic valve, consistent with worsening noted on echocardiogram.  Continue plans for TAVR w/u.   Echo 03/26/22: IMPRESSIONS     1. Left ventricular ejection fraction, by estimation, is 65 to 70%. The  left ventricle has normal function. The left ventricle has no regional  wall motion abnormalities. There is moderate left ventricular hypertrophy.  Left ventricular diastolic  parameters are indeterminate.   2. Right ventricular systolic function is normal. The right ventricular  size is normal. Tricuspid regurgitation signal is inadequate for assessing  PA pressure.   3. Left atrial size was moderately dilated.   4. The mitral valve is degenerative. Trivial mitral valve regurgitation.  Mild mitral stenosis. The mean mitral valve gradient is 5.0 mmHg with  average heart rate of 68 bpm. Severe mitral annular calcification.   5. The abnormal color flow signal seen previously below the prosthetic  aortic valve is seen again on this exam, color flow in multiple views  suggestive of valvular regurgitation, though it cannot be demonstrated  with spectral Doppler, which would  support color flow artifact, possibly from stent frame of valve. No  evidence of prosthetic valve  obstruction or significant regurgitation.      The aortic valve has been repaired/replaced. Aortic valve  regurgitation is mild. There is a 23 mm Sapien prosthetic (TAVR) valve  present in the  aortic position. Procedure Date: 08/03/21. Echo findings are  consistent with likely stable structure and  function of the aortic valve prosthesis. Aortic valve area, by VTI  measures 2.58 cm. Aortic valve mean gradient measures 11.0 mmHg. Aortic  valve Vmax measures 1.98 m/s. Aortic valve acceleration time measures 85  msec.   6. The inferior vena cava is normal in size with greater than 50%  respiratory variability, suggesting right atrial pressure of 3 mmHg.    Event monitor 06/07/22: Study Highlights      Nromal sinus rhythm   Rare PVCs with bigeminy   Occasional PACs with short runs of SVT   On October 27 there were 2 episoded of SVT c/w atrial flutter at 2:48 and 5:42 pm. longest lasting 8 minutes and 24 seconds.     Patch Wear Time:  13 days and 21 hours (2023-10-20T13:40:10-398 to 2023-11-03T11:06:28-0400)   Patient had a min HR of 61 bpm, max HR of 211 bpm, and avg HR of 90 bpm. Predominant underlying rhythm was Sinus Rhythm. QRS morphology changes were present throughout recording. 53 Supraventricular Tachycardia runs occurred, the run with the fastest  interval lasting 6 beats with a max rate of 211 bpm, the longest lasting 8 mins 24 secs with an avg rate of 158 bpm. Some episodes of Supraventricular Tachycardia may be possible Atrial Tachycardia with variable block. Isolated SVEs were occasional  (1.6%, 28180), SVE Couplets were rare (<1.0%, 955), and SVE Triplets were rare (<1.0%, 127). Isolated VEs were rare (<1.0%, 12422), VE Couplets were rare (<1.0%, 213), and VE Triplets were rare (<1.0%, 63). Ventricular Bigeminy and Trigeminy were   Echo 08/10/22: IMPRESSIONS     1. On image 60, there is high velocity color flow seen extending into the  LV septum. Cannot exclude VSD. Left  ventricular ejection fraction, by  estimation, is 65 to 70%. The left ventricle has normal function. The left  ventricle has no regional wall  motion abnormalities. There is severe asymmetric left ventricular  hypertrophy of the septal segment. Left ventricular diastolic parameters  are consistent with Grade I diastolic dysfunction (impaired relaxation).   2. Right ventricular systolic function is normal. The right ventricular  size is normal. There is normal pulmonary artery systolic pressure.   3. Left atrial size was moderately dilated.   4. The mitral valve is degenerative. Trivial mitral valve regurgitation.  Mild mitral stenosis. Severe mitral annular calcification.   5. Tricuspid valve regurgitation is mild to moderate.   6. The aortic valve has been repaired/replaced. Aortic valve  regurgitation is trivial. There is a 23 mm Sapien prosthetic (TAVR) valve  present in the aortic position. Procedure Date: 08/03/2021. Aortic  regurgitation PHT measures 199 msec. Aortic valve  area, by VTI measures 3.18 cm. Aortic valve mean gradient measures 14.3  mmHg. Aortic valve Vmax measures 2.43 m/s.   7. The inferior vena cava is normal in size with greater than 50%  respiratory variability, suggesting right atrial pressure of 3 mmHg.   Comparison(s): Prior images reviewed side by side.   Echo 03/13/23: IMPRESSIONS     1. Left ventricular ejection fraction, by estimation, is 70 to 75%. The  left ventricle has hyperdynamic function. The left ventricle has no  regional wall motion abnormalities. There is severe left ventricular  hypertrophy. Left ventricular diastolic  parameters are consistent with Grade II diastolic dysfunction  (pseudonormalization). Elevated left atrial pressure.   2. Right ventricular systolic function is normal. The right ventricular  size is normal. There  is normal pulmonary artery systolic pressure. The  estimated right ventricular systolic pressure is 33.0 mmHg.    3. Left atrial size was mildly dilated.   4. The mitral valve is degenerative. Trivial mitral valve regurgitation.  Mild mitral stenosis. The mean mitral valve gradient is 4.0 mmHg with  average heart rate of 77 bpm. Severe mitral annular calcification.   5. The aortic valve has been repaired/replaced. Aortic valve  regurgitation is mild. There is a 23 mm Edwards Sapien prosthetic (TAVR)  valve present in the aortic position. Procedure Date: 08/03/2021. Mild PVL.  Vmax 2.8 m/s, MG , EOA 1.9 cm^2, DI  0.62.   6. Aortic dilatation noted. There is dilatation of the ascending aorta,  measuring 40 mm.   7. The inferior vena cava is normal in size with greater than 50%  respiratory variability, suggesting right atrial pressure of 3 mmHg.   ASSESSMENT:     Severe AS s/p TAVR: last Echo in August showed hyperdynamic LV function. Prosthesis is functioning well.  mild AI.  No change in exam today. She has Amoxicillin  for SBE prophylaxis. Continue anticoagulation.      2.  Left MCA stroke. Was on Eliquis  - now switched to Xarelto .  Prior Afib burden low. No rate control necessary  3.  Hx of pericardial effusion: s/p pericardial drainage. no recurrent effusion on echo in August   4. Paroxysmal fibrillation: continue Xarelto  given CVA.    5. Carotid artery disease: pre TAVR head/neck CTA with moderate right carotid artery stenosis. Patient clearly does not want any intervention.    5. s/p Non-ST elevation (NSTEMI) myocardial infarction (HCC) due to demand ischemia in setting of stress induced CM in 2020 05/13/2019- HS Troponin peak 879. With decreased EF and no significant CAD strongly suspect this is related to recurrent Takotsubo cardiomyopathy  6. Takotsubo cardiomyopathy H/O remote Takotsubo event 5 years ago-Recurrent in October 2020 EF 30-35% by echo 05/13/2019 when she presented with second event - now with normal LV function.  7. Essential hypertension BP is well controlled on  lisinopril  and low dose Bystolic .   8. Dyslipidemia On Crestor   9. Chronic diastolic dysfunction. Appears euvolemic. Continue current lasix . She does have chronic hyponatremia that is stable. Monitor weight and swelling.   Will check complete labs today. The daughter notes it was a real struggle getting her here today in our new office and would prefer to be seen by our practice at [facility]. Will arrange.   Follow up in 6 months.  Medication Adjustments/Labs and Tests Ordered: Current medicines are reviewed at length with the patient today.  Concerns regarding medicines are outlined above.  Orders Placed This Encounter  Procedures   CBC w/Diff   Comp Met (CMET)   Lipid panel   B Nat Peptide   TSH   No orders of the defined types were placed in this encounter.   There are no Patient Instructions on file for this visit.   Signed, Wallace Gappa [place], MD  12/12/2023 11:09 AM    [facility]

## 2023-12-11 ENCOUNTER — Encounter: Payer: Self-pay | Admitting: Cardiology

## 2023-12-11 NOTE — Telephone Encounter (Signed)
 FYI for app

## 2023-12-12 ENCOUNTER — Ambulatory Visit: Payer: Medicare Other | Attending: Cardiology | Admitting: Cardiology

## 2023-12-12 ENCOUNTER — Other Ambulatory Visit: Payer: Self-pay | Admitting: Sports Medicine

## 2023-12-12 ENCOUNTER — Encounter: Payer: Self-pay | Admitting: Cardiology

## 2023-12-12 VITALS — BP 120/70 | HR 70 | Wt 112.6 lb

## 2023-12-12 DIAGNOSIS — I480 Paroxysmal atrial fibrillation: Secondary | ICD-10-CM | POA: Insufficient documentation

## 2023-12-12 DIAGNOSIS — I10 Essential (primary) hypertension: Secondary | ICD-10-CM | POA: Insufficient documentation

## 2023-12-12 DIAGNOSIS — R0602 Shortness of breath: Secondary | ICD-10-CM | POA: Insufficient documentation

## 2023-12-12 DIAGNOSIS — E785 Hyperlipidemia, unspecified: Secondary | ICD-10-CM | POA: Insufficient documentation

## 2023-12-12 NOTE — Telephone Encounter (Signed)
Pharmacy requested refill.  Pended Rx and sent to Dr. Jacquenette Shone for approval.

## 2023-12-12 NOTE — Patient Instructions (Signed)
 Medication Instructions:  Continue same medications *If you need a refill on your cardiac medications before your next appointment, please call your pharmacy*  Lab Work: Cbc,cmet,lipid panel,bnp,tsh today  Testing/Procedures: None ordered  Follow-Up: At [facility], you and your health needs are our priority.  As part of our continuing mission to provide you with exceptional heart care, our providers are all part of one team.  This team includes your primary Cardiologist (physician) and Advanced Practice Providers or APPs (Physician Assistants and Nurse Practitioners) who all work together to provide you with the care you need, when you need it.  Your next appointment:  6 months    Call in July to schedule Nov appointment     Provider:  Dr.Lame Deer    We recommend signing up for the patient portal called "MyChart".  Sign up information is provided on this After Visit Summary.  MyChart is used to connect with patients for Virtual Visits (Telemedicine).  Patients are able to view lab/test results, encounter notes, upcoming appointments, etc.  Non-urgent messages can be sent to your provider as well.   To learn more about what you can do with MyChart, go to [email or web address].

## 2023-12-13 ENCOUNTER — Ambulatory Visit: Payer: Self-pay | Admitting: Cardiology

## 2023-12-13 ENCOUNTER — Encounter: Payer: Self-pay | Admitting: Cardiology

## 2023-12-13 DIAGNOSIS — R5381 Other malaise: Secondary | ICD-10-CM

## 2023-12-13 LAB — BRAIN NATRIURETIC PEPTIDE: BNP: 253.2 pg/mL — ABNORMAL HIGH (ref 0.0–100.0)

## 2023-12-13 LAB — CBC WITH DIFFERENTIAL/PLATELET
Basophils Absolute: 0.1 10*3/uL (ref 0.0–0.2)
Basos: 1 %
EOS (ABSOLUTE): 0.1 10*3/uL (ref 0.0–0.4)
Eos: 2 %
Hematocrit: 36.5 % (ref 34.0–46.6)
Hemoglobin: 11.7 g/dL (ref 11.1–15.9)
Immature Grans (Abs): 0 10*3/uL (ref 0.0–0.1)
Immature Granulocytes: 0 %
Lymphocytes Absolute: 1.8 10*3/uL (ref 0.7–3.1)
Lymphs: 27 %
MCH: 29.7 pg (ref 26.6–33.0)
MCHC: 32.1 g/dL (ref 31.5–35.7)
MCV: 93 fL (ref 79–97)
Monocytes Absolute: 0.5 10*3/uL (ref 0.1–0.9)
Monocytes: 7 %
Neutrophils Absolute: 4.3 10*3/uL (ref 1.4–7.0)
Neutrophils: 63 %
Platelets: 277 10*3/uL (ref 150–450)
RBC: 3.94 x10E6/uL (ref 3.77–5.28)
RDW: 13.1 % (ref 11.7–15.4)
WBC: 6.7 10*3/uL (ref 3.4–10.8)

## 2023-12-13 LAB — LIPID PANEL
Chol/HDL Ratio: 2 ratio (ref 0.0–4.4)
Cholesterol, Total: 205 mg/dL — ABNORMAL HIGH (ref 100–199)
HDL: 102 mg/dL (ref >39)
LDL Chol Calc (NIH): 82 mg/dL (ref 0–99)
Triglycerides: 126 mg/dL (ref 0–149)
VLDL Cholesterol Cal: 21 mg/dL (ref 5–40)

## 2023-12-13 LAB — COMPREHENSIVE METABOLIC PANEL WITH GFR
ALT: 22 IU/L (ref 0–32)
AST: 54 IU/L — ABNORMAL HIGH (ref 0–40)
Albumin: 4.2 g/dL (ref 3.7–4.7)
Alkaline Phosphatase: 65 IU/L (ref 44–121)
BUN/Creatinine Ratio: 13 (ref 12–28)
BUN: 14 mg/dL (ref 8–27)
Bilirubin Total: 1.3 mg/dL — ABNORMAL HIGH (ref 0.0–1.2)
CO2: 19 mmol/L — ABNORMAL LOW (ref 20–29)
Calcium: 9.4 mg/dL (ref 8.7–10.3)
Chloride: 95 mmol/L — ABNORMAL LOW (ref 96–106)
Creatinine, Ser: 1.06 mg/dL — ABNORMAL HIGH (ref 0.57–1.00)
Globulin, Total: 2.7 g/dL (ref 1.5–4.5)
Glucose: 81 mg/dL (ref 70–99)
Potassium: 4.7 mmol/L (ref 3.5–5.2)
Sodium: 135 mmol/L (ref 134–144)
Total Protein: 6.9 g/dL (ref 6.0–8.5)
eGFR: 51 mL/min/{1.73_m2} — ABNORMAL LOW (ref >59)

## 2023-12-13 LAB — TSH: TSH: 1.18 u[IU]/mL (ref 0.450–4.500)

## 2023-12-13 MED ORDER — GABAPENTIN 100 MG PO CAPS
100.0000 mg | ORAL_CAPSULE | Freq: Two times a day (BID) | ORAL | 0 refills | Status: AC
Start: 2023-12-13 — End: ?

## 2023-12-14 ENCOUNTER — Telehealth: Payer: Self-pay | Admitting: *Deleted

## 2023-12-14 NOTE — Telephone Encounter (Signed)
 Barbara Gall, MD  [facility] Clinical20 minutes ago (2:26 PM)    noted

## 2023-12-14 NOTE — Telephone Encounter (Signed)
 Copied from CRM [id number]. Topic: Clinical - Lab/Test Results >> Rakeb Kibble 22, 2025 12:51 PM [name] wrote: Reason for CRM: Patient's daughter, Barbara Mcconnell, called in stating that her mom had labs done and ordered by another physician. She wanted to make Barbara Mcconnell aware so that her and Barbara Mcconnell could be on the same page. Inda [place] that if Barbara Mcconnell needs to see the labs, she can view them via the patient's chart as they are in her chart. Barbara Mcconnell stated the results has already been read by Barbara Mcconnell but she didn't know if Barbara Mcconnell would be alerted.

## 2023-12-15 ENCOUNTER — Telehealth: Payer: Self-pay | Admitting: Cardiology

## 2023-12-15 NOTE — Telephone Encounter (Signed)
 Spoke with pharmacy and they state patient is having a hard time swallowing.  Are you ok with patient taking her gabapentin with apple sauce.

## 2023-12-15 NOTE — Telephone Encounter (Signed)
 Pt c/o medication issue:  1. Name of Medication: gabapentin (NEURONTIN) 100 MG capsule   2. How are you currently taking this medication (dosage and times per day)? N/A  3. Are you having a reaction (difficulty breathing--STAT)? No  4. What is your medication issue? Pt nursing home would like to know if the pt could take this medication with applesauce due to it being hard for the pt to swallow if so please place a new order.

## 2023-12-19 NOTE — Telephone Encounter (Signed)
 Spoke with Vaughn Georges and he is aware patient can take her gabapentin with applesauce

## 2023-12-27 ENCOUNTER — Telehealth: Payer: Self-pay | Admitting: Sports Medicine

## 2023-12-27 NOTE — Telephone Encounter (Signed)
 Verbal was given to Donnie Galea from [facility] per [facility] policy. Buzzy Cassette, MD was notified.   Message sent to Tye Gall, MD

## 2023-12-27 NOTE — Telephone Encounter (Signed)
 Ok for the PT orders.

## 2023-12-27 NOTE — Telephone Encounter (Signed)
 Copied from CRM [id number]. Topic: Clinical - Home Health Verbal Orders >> Dec 26, 2023  4:45 PM Tiffany H wrote: Caller/Agency: [facility] Pin[facility]  Callback Number: [phone] Service Requested: Physical Therapy Frequency: 1x4 Any new concerns about the patient? No   Okay to approve?  Please advise

## 2023-12-28 ENCOUNTER — Other Ambulatory Visit: Payer: Self-pay | Admitting: Sports Medicine

## 2023-12-28 DIAGNOSIS — R0982 Postnasal drip: Secondary | ICD-10-CM

## 2023-12-28 DIAGNOSIS — F339 Major depressive disorder, recurrent, unspecified: Secondary | ICD-10-CM

## 2024-01-02 ENCOUNTER — Encounter: Payer: Self-pay | Admitting: Cardiology

## 2024-01-05 MED ORDER — GABAPENTIN 100 MG PO CAPS: ORAL_CAPSULE | ORAL | 0 refills | Status: DC

## 2024-01-05 NOTE — Telephone Encounter (Signed)
 Daughter [name] calling wanting to speak to nurse about discontinuing this medication and will also need to send something to the assisted living facility

## 2024-01-05 NOTE — Telephone Encounter (Signed)
 Per [place] an order needs to go to [facility] in [place].   The tapering schedule that the pharmacist recommended is below.  [facility] needs notified of the change and the discontinuation/tapering schedule.    [facility] [place] - [phone]

## 2024-01-05 NOTE — Telephone Encounter (Signed)
 Taper/discontinuation instructions clarified by PharmD and then prescription sent electronically to [facility].

## 2024-01-05 NOTE — Telephone Encounter (Signed)
 Would do 100mg  daily for 3 days, the every other day for 3 doses, then stop

## 2024-01-08 ENCOUNTER — Other Ambulatory Visit: Payer: Self-pay | Admitting: Adult Health

## 2024-01-08 NOTE — Telephone Encounter (Signed)
Pharmacy requested refill.  Pended Rx and sent to Dr. Jacquenette Shone for approval.

## 2024-01-24 ENCOUNTER — Other Ambulatory Visit: Payer: Self-pay | Admitting: Cardiology

## 2024-02-01 ENCOUNTER — Encounter: Payer: Self-pay | Admitting: Cardiology

## 2024-02-02 DIAGNOSIS — I480 Paroxysmal atrial fibrillation: Secondary | ICD-10-CM | POA: Diagnosis not present

## 2024-02-02 DIAGNOSIS — Z7901 Long term (current) use of anticoagulants: Secondary | ICD-10-CM

## 2024-02-02 DIAGNOSIS — F32A Depression, unspecified: Secondary | ICD-10-CM

## 2024-02-02 DIAGNOSIS — Z556 Problems related to health literacy: Secondary | ICD-10-CM

## 2024-02-02 DIAGNOSIS — Z87440 Personal history of urinary (tract) infections: Secondary | ICD-10-CM

## 2024-02-02 DIAGNOSIS — Z952 Presence of prosthetic heart valve: Secondary | ICD-10-CM

## 2024-02-02 DIAGNOSIS — Z87891 Personal history of nicotine dependence: Secondary | ICD-10-CM

## 2024-02-02 DIAGNOSIS — K7469 Other cirrhosis of liver: Secondary | ICD-10-CM

## 2024-02-02 DIAGNOSIS — E785 Hyperlipidemia, unspecified: Secondary | ICD-10-CM

## 2024-02-02 DIAGNOSIS — I110 Hypertensive heart disease with heart failure: Secondary | ICD-10-CM | POA: Diagnosis not present

## 2024-02-02 DIAGNOSIS — Z9181 History of falling: Secondary | ICD-10-CM

## 2024-02-02 DIAGNOSIS — I214 Non-ST elevation (NSTEMI) myocardial infarction: Secondary | ICD-10-CM | POA: Diagnosis not present

## 2024-02-02 DIAGNOSIS — Z8673 Personal history of transient ischemic attack (TIA), and cerebral infarction without residual deficits: Secondary | ICD-10-CM

## 2024-02-02 DIAGNOSIS — I6523 Occlusion and stenosis of bilateral carotid arteries: Secondary | ICD-10-CM

## 2024-02-02 DIAGNOSIS — K219 Gastro-esophageal reflux disease without esophagitis: Secondary | ICD-10-CM

## 2024-02-02 DIAGNOSIS — I5032 Chronic diastolic (congestive) heart failure: Secondary | ICD-10-CM

## 2024-02-02 DIAGNOSIS — Z604 Social exclusion and rejection: Secondary | ICD-10-CM

## 2024-02-02 DIAGNOSIS — F419 Anxiety disorder, unspecified: Secondary | ICD-10-CM

## 2024-02-02 DIAGNOSIS — I951 Orthostatic hypotension: Secondary | ICD-10-CM

## 2024-02-02 DIAGNOSIS — I2510 Atherosclerotic heart disease of native coronary artery without angina pectoris: Secondary | ICD-10-CM

## 2024-02-02 DIAGNOSIS — M159 Polyosteoarthritis, unspecified: Secondary | ICD-10-CM | POA: Diagnosis not present

## 2024-02-21 ENCOUNTER — Ambulatory Visit: Payer: Self-pay

## 2024-02-21 ENCOUNTER — Ambulatory Visit (INDEPENDENT_AMBULATORY_CARE_PROVIDER_SITE_OTHER): Admitting: Adult Health

## 2024-02-21 VITALS — BP 122/78 | HR 70 | Temp 99.0°F | Ht 60.0 in | Wt 112.4 lb

## 2024-02-21 DIAGNOSIS — T148XXA Other injury of unspecified body region, initial encounter: Secondary | ICD-10-CM | POA: Diagnosis not present

## 2024-02-21 DIAGNOSIS — N183 Chronic kidney disease, stage 3 unspecified: Secondary | ICD-10-CM

## 2024-02-21 DIAGNOSIS — D509 Iron deficiency anemia, unspecified: Secondary | ICD-10-CM

## 2024-02-21 DIAGNOSIS — K219 Gastro-esophageal reflux disease without esophagitis: Secondary | ICD-10-CM

## 2024-02-21 DIAGNOSIS — I129 Hypertensive chronic kidney disease with stage 1 through stage 4 chronic kidney disease, or unspecified chronic kidney disease: Secondary | ICD-10-CM

## 2024-02-21 DIAGNOSIS — I480 Paroxysmal atrial fibrillation: Secondary | ICD-10-CM | POA: Diagnosis not present

## 2024-02-21 DIAGNOSIS — I5032 Chronic diastolic (congestive) heart failure: Secondary | ICD-10-CM

## 2024-02-21 DIAGNOSIS — F339 Major depressive disorder, recurrent, unspecified: Secondary | ICD-10-CM

## 2024-02-21 LAB — BASIC METABOLIC PANEL WITH GFR
BUN: 17 mg/dL (ref 7–25)
CO2: 34 mmol/L — ABNORMAL HIGH (ref 20–32)
Calcium: 9.8 mg/dL (ref 8.6–10.4)
Chloride: 96 mmol/L — ABNORMAL LOW (ref 98–110)
Creat: 0.79 mg/dL (ref 0.60–0.95)
Glucose, Bld: 91 mg/dL (ref 65–99)
Potassium: 4.8 mmol/L (ref 3.5–5.3)
Sodium: 133 mmol/L — ABNORMAL LOW (ref 135–146)
eGFR: 73 mL/min/1.73m2 (ref >=60)

## 2024-02-21 LAB — CBC WITH DIFFERENTIAL/PLATELET
Absolute Lymphocytes: 2244 {cells}/uL (ref 850–3900)
Absolute Monocytes: 462 {cells}/uL (ref 200–950)
Basophils Absolute: 48 {cells}/uL (ref 0–200)
Basophils Relative: 0.8 %
Eosinophils Absolute: 138 {cells}/uL (ref 15–500)
Eosinophils Relative: 2.3 %
HCT: 37.4 % (ref 35.0–45.0)
Hemoglobin: 11.7 g/dL (ref 11.7–15.5)
MCH: 29.3 pg (ref 27.0–33.0)
MCHC: 31.3 g/dL — ABNORMAL LOW (ref 32.0–36.0)
MCV: 93.7 fL (ref 80.0–100.0)
MPV: 11.7 fL (ref 7.5–12.5)
Monocytes Relative: 7.7 %
Neutro Abs: 3108 {cells}/uL (ref 1500–7800)
Neutrophils Relative %: 51.8 %
Platelets: 234 Thousand/uL (ref 140–400)
RBC: 3.99 Million/uL (ref 3.80–5.10)
RDW: 12.7 % (ref 11.0–15.0)
Total Lymphocyte: 37.4 %
WBC: 6 Thousand/uL (ref 3.8–10.8)

## 2024-02-21 MED ORDER — FERROUS SULFATE 325 (65 FE) MG PO TBEC: 325.0000 mg | DELAYED_RELEASE_TABLET | ORAL | Status: DC

## 2024-02-21 NOTE — Telephone Encounter (Signed)
 FYI Only or Action Required?: FYI only for provider.  Patient was last seen in primary care on 12/05/2023 by Ngetich, Roxan BROCKS, NP.  Called Nurse Triage reporting Bleeding/Bruising.  Symptoms began yesterday.  Interventions attempted: Nothing.  Symptoms are: gradually worsening.  Triage Disposition: See Physician Within 24 Hours  Patient/caregiver understands and will follow disposition?: Yes   Copied from CRM [id number]. Topic: Clinical - Red Word Triage >> Feb 21, 2024 11:35 AM Zane F wrote: [facility] that prompted transfer to Nurse Triage:   Excessive bruising while on a blood thinner Reason for Disposition  Taking Coumadin (warfarin) or other strong blood thinner, OR known bleeding disorder (e.g., thrombocytopenia)  (Exception: Small bruise at heparin  injection site.)  Answer Assessment - Initial Assessment Questions 1. APPEARANCE of BRUISE: Describe the bruise.      Where arm meets the shoulder on left arm 2. SIZE: How large is the bruise?      large 3. NUMBER: How many bruises are there?      One large bruise 4. LOCATION: Where is the bruise located?      See above 5. ONSET: How long ago did the bruise occur?      yesterday 6. CAUSE: What do you think caused the bruise?     Blood thinners on xaralto 7. MEDICAL HISTORY: Do you have any medical problems that can cause easy bruising or bleeding? (e.g., leukemia, liver disease, recent chemotherapy)     no 8. MEDICINES: Do you take any medicines which thin the blood such as: aspirin , apixaban , heparin , ibuprofen (NSAIDS), Plavix, or Coumadin?     Yes xaralto 9. OTHER SYMPTOMS: Do you have any other symptoms?  (e.g., weakness, dizziness, pain, fever, nosebleed, blood in urine/stool)     Nausea, weak and fatigue 10. PREGNANCY: Is there any chance you are pregnant? When was your last menstrual period?       na  Protocols used: [employer]

## 2024-02-21 NOTE — Progress Notes (Signed)
 [facility] clinic  Provider:  Jereld Serum DNP  Code Status:  Full Code  Goals of Care:     12/05/2023    1:57 PM  Advanced Directives  Does Patient Have a Medical Advance Directive? Yes  Type of Estate agent of [place];Living will  Does patient want to make changes to medical advance directive? No - Patient declined  Copy of Healthcare Power of Attorney in Chart? Yes - validated most recent copy scanned in chart (See row information)  Would patient like information on creating a medical advance directive? No - Patient declined     Chief Complaint  Patient presents with   Acute Visit    Bruising on left arm and some stomach discomfort and neck pain on her left side and very weak    Discussed the use of AI scribe software for clinical note transcription with the patient, who gave verbal consent to proceed.   HPI: Patient is a 89 y.o. female seen today for an acute visit for bruising. She was accompanied by her daughter. She uses a walker when ambulating.  She has atrial fibrillation and a synthetic aortic valve replacement who presents with unexplained bruising on the left arm. She is accompanied by her daughter.  She noticed bruising on her left arm yesterday, which is slightly itchy and appears to be spreading. There is no history of recent trauma or injury to the area.  She is currently on Xarelto  15 mg daily, which was started in the spring after discontinuing Eliquis  due to difficulty swallowing. This is her third anticoagulant. She experiences gastrointestinal upset and nausea, which have been more frequent recently. She occasionally takes over-the-counter Pepto-Bismol for relief.  Her past medical history includes atrial fibrillation, a synthetic aortic valve replacement, and a history of stroke. She takes iron  supplements three times a week for previous anemia, which has improved her iron  levels. She is on Lexapro  10 mg daily for depression,  although she denies feeling depressed and does not wish to change her medication regimen.  She has a history of GERD and takes famotidine  10 mg daily, which has helped with her symptoms and prevented respiratory issues related to silent GERD. She occasionally uses Pepto-Bismol melts for gastrointestinal discomfort.  She takes Lasix  20 mg daily for fluid retention and lisinopril  10 mg twice daily and  Bystolic  2.5 mg daily for hypertension, which has been well-controlled.   She reports feeling more fatigued and weak, attributing it to the heat. She resides at [place]  but does not participate in activities there. She is content with her current situation and does not wish to make any changes to her medications or lifestyle.  Past Medical History:  Diagnosis Date   Allergies    Anxiety    panic attacks   Aortic stenosis    Arthritis    Bronchiectasis with (acute) exacerbation (HCC)    CAD (coronary artery disease)    non obst CAD   Depression due to acute stroke (HCC)    Dizziness    Elevated liver enzymes    Fall    Fluid overload    GERD (gastroesophageal reflux disease)    Heart murmur    Hemorrhoids    History of mammogram    Hyperlipemia    Hypertension    Internal carotid artery stenosis    OCD (obsessive compulsive disorder)    Orthostatic hypotension    Other cirrhosis of liver (HCC)    PAF (paroxysmal atrial fibrillation) (HCC)  Pericardial effusion    Physical deconditioning    Renal insufficiency    Rheumatic heart failure (congestive) (HCC)    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 Wendel & Dr. Lucas   Seasonal allergies    Severe aortic stenosis    Stroke [facility])    Takotsubo cardiomyopathy     Past Surgical History:  Procedure Laterality Date   COLONOSCOPY     EYE SURGERY Bilateral    cataracts removed   INTRAOPERATIVE TRANSTHORACIC ECHOCARDIOGRAM N/A 08/03/2021    Procedure: INTRAOPERATIVE TRANSTHORACIC ECHOCARDIOGRAM;  Surgeon: Wendel Lurena POUR, MD;  Location: [facility] INVASIVE CV LAB;  Service: Open Heart Surgery;  Laterality: N/A;   IR CT HEAD LTD  03/25/2022   IR PERCUTANEOUS ART THROMBECTOMY/INFUSION INTRACRANIAL INC DIAG ANGIO  03/25/2022   IR US  GUIDE VASC ACCESS RIGHT  03/25/2022   PERICARDIOCENTESIS N/A 07/28/2021   Procedure: PERICARDIOCENTESIS;  Surgeon: Wendel Lurena POUR, MD;  Location: [facility] INVASIVE CV LAB;  Service: Cardiovascular;  Laterality: N/A;   RADIOLOGY WITH ANESTHESIA N/A 03/25/2022   Procedure: IR WITH ANESTHESIA;  Surgeon: Radiologist, Medication, MD;  Location: [facility] OR;  Service: Radiology;  Laterality: N/A;   RIGHT/LEFT HEART CATH AND CORONARY ANGIOGRAPHY N/A 05/13/2019   Procedure: RIGHT/LEFT HEART CATH AND CORONARY ANGIOGRAPHY;  Surgeon: Swazil[place], Peter M, MD;  Location: [facility] INVASIVE CV LAB;  Service: Cardiovascular;  Laterality: N/A;   RIGHT/LEFT HEART CATH AND CORONARY ANGIOGRAPHY N/A 07/23/2021   Procedure: RIGHT/LEFT HEART CATH AND CORONARY ANGIOGRAPHY;  Surgeon: Dann Candyce RAMAN, MD;  Location: [facility] INVASIVE CV LAB;  Service: Cardiovascular;  Laterality: N/A;   TONSILLECTOMY     TRANSCATHETER AORTIC VALVE REPLACEMENT, TRANSFEMORAL N/A 08/03/2021   Procedure: TRANSCATHETER AORTIC VALVE REPLACEMENT, TRANSFEMORAL;  Surgeon: Thukkani, Arun K, MD;  Location: [facility] INVASIVE CV LAB;  Service: Open Heart Surgery;  Laterality: N/A;   UPPER GI ENDOSCOPY      Allergies  Allergen Reactions   Codeine Nausea And Vomiting   Cyclobenzaprine  Other (See Comments)    Unknown reaction   Iodinated Contrast Media Nausea Only   Prednisone  Other (See Comments)    Sores in the mouth    Outpatient Encounter Medications as of 02/21/2024  Medication Sig   acetaminophen  (TYLENOL ) 500 MG tablet Take 500-1,000 mg by mouth every 6 (six) hours as needed for moderate pain.   ACID REDUCER 10 MG tablet TAKE (1) TABLET BY MOUTH TWICE DAILY FOR GERD.   albuterol   (VENTOLIN  HFA) 108 (90 Base) MCG/ACT inhaler Inhale 2 puffs into the lungs every 4 (four) hours as needed for wheezing or shortness of breath (seasonal allergies).   docusate sodium  (COLACE) 100 MG capsule TAKE (1) CAPSULE BY MOUTH ONCE DAILY FOR CONSTIPATION.   escitalopram  (LEXAPRO ) 10 MG tablet TAKE (1) TABLET BY MOUTH ONCE DAILY.   fexofenadine  (ALLEGRA ) 60 MG tablet TAKE (1) TABLET BY MOUTH ONCE EVERY MORNING.   furosemide  (LASIX ) 20 MG tablet TAKE (1/2) TABLET (10MG ) BY MOUTH ONCE DAILY IN THE MORNING.   lisinopril  (ZESTRIL ) 10 MG tablet Take 1 tablet (10 mg total) by mouth 2 (two) times daily.   montelukast  (SINGULAIR ) 10 MG tablet TAKE (1) TABLET BY MOUTH ONCE DAILY.   Multiple Vitamins-Minerals (EMERGEN-C VITAMIN C PO) Take 1 packet by mouth daily. Mix with water   nebivolol  (BYSTOLIC ) 2.5 MG tablet TAKE (1) TABLET BY MOUTH ONCE DAILY.   oxymetazoline  (AFRIN) 0.05 % nasal spray USE (2)  SPRAYS IN EACH NOSTRIL TWICE DAILY AS NEEDED FOR NOSE BLEED.   polyethylene glycol powder (GLYCOLAX /MIRALAX ) 17 GM/SCOOP powder Take 17 g by mouth daily as needed for moderate constipation. Hold for loose stool   rosuvastatin  (CRESTOR ) 20 MG tablet TAKE (1) TABLET BY MOUTH AT BEDTIME.   Trolamine Salicylate (ASPERCREME EX) Apply 1 application topically daily as needed (knee pain).   XARELTO  15 MG TABS tablet TAKE (1) TABLET BY MOUTH ONCE DAILY.   [DISCONTINUED] ferrous sulfate  325 (65 FE) MG EC tablet Take 325 mg by mouth 3 (three) times daily.   ferrous sulfate  325 (65 FE) MG EC tablet Take 1 tablet (325 mg total) by mouth every Monday, Wednesday, and Friday.   gabapentin  (NEURONTIN ) 100 MG capsule Take one tablet by mouth daily for 3 days, then take one tablet by mouth every other day, then stop (Patient not taking: Reported on 02/21/2024)   Multiple Vitamins-Minerals (EMERGEN-C VITAMIN C) PACK MIX CONTENTS OF (1) PACKET WITH 4-6 OUNCES OF WATER, STIR, THEN DRINK ONCE DAILY. (Patient not taking: Reported on  02/21/2024)   No facility-administered encounter medications on file as of 02/21/2024.    Review of Systems:  Review of Systems  Constitutional:  Negative for appetite change, chills, fatigue and fever.  HENT:  Negative for congestion, hearing loss, rhinorrhea and sore throat.   Eyes: Negative.   Respiratory:  Negative for cough, shortness of breath and wheezing.   Cardiovascular:  Negative for chest pain, palpitations and leg swelling.  Gastrointestinal:  Negative for abdominal pain, constipation, diarrhea, nausea and vomiting.  Genitourinary:  Negative for dysuria.  Musculoskeletal:  Negative for arthralgias, back pain and myalgias.  Skin:  Negative for color change, rash and wound.  Neurological:  Negative for dizziness, weakness and headaches.  Hematological:  Bruises/bleeds easily.  Psychiatric/Behavioral:  Negative for behavioral problems. The patient is not nervous/anxious.     Health Maintenance  Topic Date Due   Hepatitis B Vaccines (1 of 3 - Risk 3-dose series) Never done   COVID-19 Vaccine (7 - 2024-25 season) 10/05/2023   DEXA SCAN  12/04/2024 (Originally 02/25/2002)   INFLUENZA VACCINE  02/23/2024   DTaP/Tdap/Td (2 - Td or Tdap) 07/14/2024   Medicare Annual Wellness (AWV)  12/04/2024   Pneumococcal Vaccine: 50+ Years  Completed   Zoster Vaccines- Shingrix  Completed   HPV VACCINES  Aged Out   Meningococcal B Vaccine  Aged Out    Physical Exam: Vitals:   02/21/24 1416  BP: 122/78  Pulse: 70  Temp: 99 F (37.2 C)  SpO2: 98%  Weight: 112 lb 6.4 oz (51 kg)  Height: 5' (1.524 m)   Body mass index is 21.95 kg/m. Physical Exam Constitutional:      Appearance: Normal appearance.  HENT:     Head: Normocephalic and atraumatic.     Nose: Nose normal.     Mouth/Throat:     Mouth: Mucous membranes are moist.  Eyes:     Conjunctiva/sclera: Conjunctivae normal.  Cardiovascular:     Rate and Rhythm: Normal rate and regular rhythm.  Pulmonary:     Effort:  Pulmonary effort is normal.     Breath sounds: Normal breath sounds.  Abdominal:     General: Bowel sounds are normal.     Palpations: Abdomen is soft.  Musculoskeletal:        General: Normal range of motion.     Cervical back: Normal range of motion.     Right lower leg: Edema present.  Left lower leg: Edema present.     Comments: Trace edema bilateral lower extremity  Skin:    General: Skin is warm and dry.     Comments: Bruise on left upper arm  Neurological:     General: No focal deficit present.     Mental Status: She is alert and oriented to person, place, and time.  Psychiatric:        Mood and Affect: Mood normal.        Behavior: Behavior normal.        Thought Content: Thought content normal.        Judgment: Judgment normal.     Labs reviewed: Basic Metabolic Panel: Recent Labs    06/01/23 1034 09/07/23 1358 12/12/23 1137  NA 131* 133* 135  K 4.4 4.7 4.7  CL 94* 96* 95*  CO2 32 23 19*  GLUCOSE 93 109 81  BUN 15 18 14   CREATININE 1.07* 1.02* 1.06*  CALCIUM  9.7 9.6 9.4  TSH 0.88  --  1.180   Liver Function Tests: Recent Labs    12/12/23 1137  AST 54*  ALT 22  ALKPHOS 65  BILITOT 1.3*  PROT 6.9  ALBUMIN 4.2   No results for input(s): LIPASE, AMYLASE in the last 8760 hours. No results for input(s): AMMONIA in the last 8760 hours. CBC: Recent Labs    04/18/23 1354 09/07/23 1358 12/12/23 1137  WBC 6.3 7.2 6.7  NEUTROABS  --   --  4.3  HGB 10.3* 11.0* 11.7  HCT 32.6* 34.1* 36.5  MCV 91.1 90.9 93  PLT 256 247 277   Lipid Panel: Recent Labs    06/01/23 1034 12/12/23 1137  CHOL 200* 205*  HDL 93 102  LDLCALC 82 82  TRIG 145 126  CHOLHDL 2.2 2.0   Lab Results  Component Value Date   HGBA1C 5.4 03/26/2022    Procedures since last visit: No results found.  Assessment/Plan  1. Bruise (Primary) -  Bruising likely due to chronic anticoagulation therapy with Xarelto . - Order CBC with platelet count to assess for any  hematological abnormalities.  2. PAF (paroxysmal atrial fibrillation) (HCC) -  Chronic anticoagulation with Xarelto  15 mg daily for atrial fibrillation, synthetic aortic valve replacement, and stroke. - Continue Xarelto  15 mg daily.  3. Benign hypertension with chronic kidney disease, stage III (HCC) -  managed with lisinopril  10 mg twice daily and Bystolic  2.5 mg daily. Blood pressure is well-controlled at 120/70 mmHg. - Basic metabolic panel  4. Iron  deficiency anemia, unspecified iron  deficiency anemia type Lab Results  Component Value Date   HGB 11.7 12/12/2023    - ferrous sulfate  325 (65 FE) MG EC tablet; Take 1 tablet (325 mg total) by mouth every Monday, Wednesday, and Friday. - CBC with Differential/Platelets  5. Chronic diastolic heart failure (HCC) -  no shortness of breath -  managed with Lasix  20 mg daily  6. Major depression, recurrent, chronic (HCC) -  well-managed on Lexapro  10 mg daily  7. GERD  -  managed with famotidine  10 mg daily. Experiences gastrointestinal upset and occasional nausea, possibly exacerbated by Xarelto . Advised against Pepto-Bismol use due to potential interactions.    Labs/tests ordered:   CBC, BMP   Return if symptoms worsen or fail to improve.  Barbara Gisler Medina-Vargas, NP

## 2024-02-26 ENCOUNTER — Other Ambulatory Visit: Payer: Self-pay | Admitting: Family Medicine

## 2024-02-26 ENCOUNTER — Other Ambulatory Visit: Payer: Self-pay | Admitting: Sports Medicine

## 2024-02-26 DIAGNOSIS — I10 Essential (primary) hypertension: Secondary | ICD-10-CM

## 2024-02-26 DIAGNOSIS — T7840XA Allergy, unspecified, initial encounter: Secondary | ICD-10-CM

## 2024-02-27 ENCOUNTER — Ambulatory Visit: Payer: Self-pay | Admitting: Adult Health

## 2024-02-27 NOTE — Progress Notes (Signed)
-     No anemia -   Platelet within normal level -Sodium 133, down from 135 , stable -  continue current medications

## 2024-03-05 ENCOUNTER — Ambulatory Visit: Admitting: Family

## 2024-03-20 ENCOUNTER — Other Ambulatory Visit: Payer: Self-pay | Admitting: Family Medicine

## 2024-03-20 NOTE — Telephone Encounter (Signed)
 High Risk Warning Populated when attempting to refill, I will send to Provider for further review

## 2024-04-09 ENCOUNTER — Ambulatory Visit: Admitting: Sports Medicine

## 2024-04-24 ENCOUNTER — Telehealth: Payer: Self-pay | Admitting: Sports Medicine

## 2024-04-24 NOTE — Telephone Encounter (Unsigned)
 Copied from CRM #[id number]. Topic: Clinical - Medication Refill >> Apr 24, 2024  4:13 PM Fredrica W wrote: Medication: Trolamine Salicylate (ASPERCREME EX)  Has the patient contacted their pharmacy? Yes - Pharmacy Requested. Also sent electronically was denied.  (Agent: If no, request that the patient contact the pharmacy for the refill. If patient does not wish to contact the pharmacy document the reason why and proceed with request.) (Agent: If yes, when and what did the pharmacy advise?)  This is the patient's preferred pharmacy:  [facility] [place] - [address] [place] [address] Phone: [phone] Fax: [phone]  Is this the correct pharmacy for this prescription? Yes If no, delete pharmacy and type the correct one.   Has the prescription been filled recently? No  Is the patient out of the medication? N/A  Has the patient been seen for an appointment in the last year OR does the patient have an upcoming appointment? Yes  Can we respond through MyChart? No  Agent: Please be advised that Rx refills may take up to 3 business days. We ask that you follow-up with your pharmacy.

## 2024-04-24 NOTE — Telephone Encounter (Signed)
 Patient requests order for aspercreme, unable to pend

## 2024-04-24 NOTE — Telephone Encounter (Signed)
Message routed to PCP Venita Sheffield, MD

## 2024-04-25 ENCOUNTER — Other Ambulatory Visit: Payer: Self-pay | Admitting: Sports Medicine

## 2024-04-25 DIAGNOSIS — T7840XA Allergy, unspecified, initial encounter: Secondary | ICD-10-CM

## 2024-04-25 DIAGNOSIS — I10 Essential (primary) hypertension: Secondary | ICD-10-CM

## 2024-04-26 MED ORDER — LIDOCAINE HCL 4 % EX CREA: 1.0000 | TOPICAL_CREAM | Freq: Every day | CUTANEOUS | 3 refills | Status: AC | PRN

## 2024-04-27 ENCOUNTER — Encounter: Payer: Self-pay | Admitting: Sports Medicine

## 2024-04-30 ENCOUNTER — Telehealth: Payer: Self-pay

## 2024-04-30 ENCOUNTER — Other Ambulatory Visit: Payer: Self-pay | Admitting: Sports Medicine

## 2024-04-30 DIAGNOSIS — R4189 Other symptoms and signs involving cognitive functions and awareness: Secondary | ICD-10-CM

## 2024-04-30 DIAGNOSIS — Z952 Presence of prosthetic heart valve: Secondary | ICD-10-CM

## 2024-04-30 DIAGNOSIS — I6930 Unspecified sequelae of cerebral infarction: Secondary | ICD-10-CM

## 2024-04-30 DIAGNOSIS — I5032 Chronic diastolic (congestive) heart failure: Secondary | ICD-10-CM

## 2024-04-30 NOTE — Telephone Encounter (Signed)
 Representative with [facility] called to clarify if recent referral ws for hospice or palliative care (emphasized can not be for both). I placed caller on hold to check with Sherlynn Madden, MD and she was unavailable. I called patients daughter to seek clarification, and she stated she would like palliative care.  Call resumed and answer from daughter was shared.   Dr.Veludandi was later made aware of this encounter (verbal conversation)

## 2024-05-15 ENCOUNTER — Telehealth: Payer: Self-pay | Admitting: Cardiology

## 2024-05-15 NOTE — Telephone Encounter (Signed)
 Caller Rick) called in response to faxed follow-up regarding patient's referral for physical therapy and noted patient had therapy in June 25.

## 2024-05-15 NOTE — Telephone Encounter (Signed)
 Returned call to [employer] with [facility] she was calling to verify if I faxed a order.Advised I have not faxed any orders.

## 2024-05-15 NOTE — Telephone Encounter (Signed)
 Hi Cheryl,   Did you fax anything regarding PT orders for this patient?

## 2024-05-17 ENCOUNTER — Telehealth: Payer: Self-pay

## 2024-05-17 NOTE — Telephone Encounter (Signed)
 Patients daughter concerned about the patient because her mother has breathing issues, has been feeling uncomfortable, the oxygen was 80 when evaluated by nurse Alan with [facility]. The nurse then disappeared, per [facility], nothing was done about low oxygen level. Barbara Mcconnell stated  This was the strangest and weirdest encounter that we have had with a medical professional. Barbara Mcconnell is not sure if Barbara Mcconnell pulse oximeter was working correctly.  I have a verbal conversation with Barbara An, NP and she stated patient needs to go to the emergency room, call 911.   Barbara Mcconnell would also like to know if Dr.Veludandi thinks her mother should have a MOST form completed as that was recommended by the [facility] nurse before she disappeared.   Barbara Mcconnell will call the assisted living facility have them recheck vitals and if abnormal aware that she needs to go to the emergency room.

## 2024-05-20 ENCOUNTER — Ambulatory Visit: Payer: Self-pay | Admitting: Sports Medicine

## 2024-05-20 NOTE — Telephone Encounter (Signed)
 FYI Only or Action Required?: Action required by provider: update on patient condition.  Patient was last seen in primary care on 02/21/2024 by Medina-Vargas, Jereld BROCKS, NP.  Called Nurse Triage reporting Dementia.  Triage Disposition: Call EMS 911 Now  Patient/caregiver understands and will follow disposition?: No, wishes to speak with PCP          Copied from CRM #[id number]. Topic: Clinical - Red Word Triage >> May 20, 2024  8:43 AM Cherylann RAMAN wrote: Red Word that prompted transfer to Nurse Triage: Patient does not want to go to the hospital to be checked. Patient's Oxygen levels were low to mid 80s. Low to 81-82. Nurse tried to check on multiple fingers but kept getting the same results. Daughter Silvano would like some advise on next steps as she is concerned because her mom is not being cooperative. In addition, patient is confused. Reason for Disposition  Sounds like a life-threatening emergency to the triager  Answer Assessment - Initial Assessment Questions This RN spoke with pt's daughter, Silvano. Pt does not want to go to the ED. Next appt is 11/4. Pt daughter does not think she fully understands what is going on. This RN recommends pt goes to ED but pt declines. Pt daughter call back number is [phone]. Pt daughter wants an appt today if possible. This RN notified [facility] of pt refusal of ED disposition.    Pt had stated to nurse she wishes something would take her and it would all be over Pt had a home health nurse come on Fri; unable to do a full physical examination as pt was confused why she was there and not cooperative The highest o2 saturation was 86%; it ranged from low to mid 80s; usually in high 90s%; pt does not use supplemental oxygen Pt has been showing signs of further fatigue, SOB, bouts of confusion  Protocols used: Oxygen Monitoring and Hypoxia-A-AH

## 2024-05-21 ENCOUNTER — Telehealth: Payer: Self-pay

## 2024-05-21 ENCOUNTER — Other Ambulatory Visit: Payer: Self-pay

## 2024-05-21 MED ORDER — UNABLE TO FIND: 0 refills | Status: DC

## 2024-05-21 NOTE — Telephone Encounter (Signed)
 Copied from CRM #[id number]. Topic: General - Other >> May 21, 2024  3:54 PM Mercer PEDLAR wrote: Reason for CRM: Therisa is calling from [facility] stating that she received an email from patient's daughter Milaya Hora stating that they will receive an order from PCP for monetiring oxygen saturation but they have not received it.   Fax: [phone] Callback: [phone]

## 2024-05-21 NOTE — Telephone Encounter (Signed)
 Barbara Madden, MD to [facility] (Selected Message)     05/21/24 10:42 AM Ok for the verbal order to check pulse oximetry every 4 hrs  If patient is hypoxic and her o2 goes below 90% , recommend transitioning to  hospice as pt does not going to hospital or aggressive interventions as pt is focussed on more comfort care.  Order created and available for viewing on active medication list- see unable to find listing   Refer to chart review- Misc Reports- Chart review:Routing History for documentation of fax sent to [facility] [phone]

## 2024-05-21 NOTE — Telephone Encounter (Signed)
 Order faxed again (sent electronically through epic with fax option)   Refer to chart review- Misc Reports- Chart review:Routing History for documentation

## 2024-05-21 NOTE — Telephone Encounter (Signed)
 Copied from CRM #[id number]. Topic: Clinical - Medical Advice >> May 21, 2024  9:47 AM Corin V wrote: Reason for CRM: Patient daughter is calling to see if patient can get an order for oxygen to have reading in the home with palliative care. Patient does not want to go to emergency room due to being in palliative care and not wanting to have additional care. Assisted living facility with [facility]. She is also unsure if the visit on 11/4 needs to be kept or moved sooner. She is requesting an order to have patient pulseoxometer readings to check o2 levels at whatever frequency provider wants each day. They just need order to do pulseoxometer readings. Patient was triaged yesterday and there has not been a response to orders. Please send orders to [facility] (Phone: [phone], Care Director: Therisa Berg, Executive Director: Angeline Hammersmith, RN) as soon as possible so that they can monitor oxygen levels and verify if she is stable or having dips in levels that require further intervention. A report with oxygen levels from Friday was sent via MyChart.   [place], [place], would like to have a call back (P: [phone]) or MyChart message before end of day to verify that this concern has been acknowledged and to see if orders can be sent to [facility] today if possible.

## 2024-05-21 NOTE — Telephone Encounter (Signed)
 Left detailed message informing Barbara Mcconnell order faxed

## 2024-05-28 ENCOUNTER — Ambulatory Visit: Admitting: Sports Medicine

## 2024-05-28 ENCOUNTER — Encounter: Payer: Self-pay | Admitting: Family

## 2024-05-28 ENCOUNTER — Ambulatory Visit (INDEPENDENT_AMBULATORY_CARE_PROVIDER_SITE_OTHER): Admitting: Family

## 2024-05-28 ENCOUNTER — Telehealth: Payer: Self-pay

## 2024-05-28 VITALS — BP 122/78 | HR 70 | Temp 97.8°F | Resp 18 | Ht 60.0 in | Wt 111.4 lb

## 2024-05-28 DIAGNOSIS — I6930 Unspecified sequelae of cerebral infarction: Secondary | ICD-10-CM

## 2024-05-28 DIAGNOSIS — I480 Paroxysmal atrial fibrillation: Secondary | ICD-10-CM | POA: Diagnosis not present

## 2024-05-28 DIAGNOSIS — D509 Iron deficiency anemia, unspecified: Secondary | ICD-10-CM | POA: Diagnosis not present

## 2024-05-28 DIAGNOSIS — I5032 Chronic diastolic (congestive) heart failure: Secondary | ICD-10-CM | POA: Diagnosis not present

## 2024-05-28 DIAGNOSIS — K219 Gastro-esophageal reflux disease without esophagitis: Secondary | ICD-10-CM

## 2024-05-28 DIAGNOSIS — R350 Frequency of micturition: Secondary | ICD-10-CM

## 2024-05-28 DIAGNOSIS — I10 Essential (primary) hypertension: Secondary | ICD-10-CM

## 2024-05-28 DIAGNOSIS — F339 Major depressive disorder, recurrent, unspecified: Secondary | ICD-10-CM

## 2024-05-28 LAB — POCT URINALYSIS DIPSTICK
Bilirubin, UA: NEGATIVE
Blood, UA: NEGATIVE
Glucose, UA: NEGATIVE
Ketones, UA: NEGATIVE
Leukocytes, UA: NEGATIVE
Nitrite, UA: NEGATIVE
Protein, UA: POSITIVE — AB
Spec Grav, UA: 1.01 (ref 1.010–1.025)
Urobilinogen, UA: 2 U/dL — AB
pH, UA: 6.5 (ref 5.0–8.0)

## 2024-05-28 NOTE — Telephone Encounter (Signed)
 Patient seen in office brought copy of MOST given to adm for scanning.

## 2024-05-28 NOTE — Telephone Encounter (Signed)
 Spoke with Silvano (daughter) patient lives in assisted living facility, O2 in low 90's and as low as mid 68's. Patient has comfort care measures and questions if this is documented in her chart. Patient does not wish to be transported to the ER/ED or have any aggressive treatment plans. [facility] completed a MOST form, however we need to have one in our chart.   Patient does not comprehend why low oxygen is a problem

## 2024-05-28 NOTE — Telephone Encounter (Signed)
 Copied from CRM [id number]. Topic: Clinical - Medical Advice >> May 28, 2024 11:28 AM Debby BROCKS wrote: Reason for CRM: Carlita Whitcomb wants to speak to a nurse or Dinah regarding her mother. Dr. LULLA was the PCP but now with the changes she just wants to place Dinah up to speed with the patients wishes, health needs, etc before the appointment today at 2:20pm. (She will also be sending a mychart message but she prefers speaking with someone as well if possible) [phone]

## 2024-05-29 ENCOUNTER — Other Ambulatory Visit: Payer: Self-pay | Admitting: Sports Medicine

## 2024-05-29 LAB — COMPREHENSIVE METABOLIC PANEL WITH GFR
AG Ratio: 1.7 (calc) (ref 1.0–2.5)
ALT: 26 U/L (ref 6–29)
AST: 52 U/L — ABNORMAL HIGH (ref 10–35)
Albumin: 4.3 g/dL (ref 3.6–5.1)
Alkaline phosphatase (APISO): 63 U/L (ref 37–153)
BUN: 17 mg/dL (ref 7–25)
CO2: 30 mmol/L (ref 20–32)
Calcium: 9.8 mg/dL (ref 8.6–10.4)
Chloride: 92 mmol/L — ABNORMAL LOW (ref 98–110)
Creat: 0.92 mg/dL (ref 0.60–0.95)
Globulin: 2.5 g/dL (ref 1.9–3.7)
Glucose, Bld: 89 mg/dL (ref 65–139)
Potassium: 4.8 mmol/L (ref 3.5–5.3)
Sodium: 130 mmol/L — ABNORMAL LOW (ref 135–146)
Total Bilirubin: 1.3 mg/dL — ABNORMAL HIGH (ref 0.2–1.2)
Total Protein: 6.8 g/dL (ref 6.1–8.1)
eGFR: 60 mL/min/1.73m2 (ref >=60)

## 2024-05-29 LAB — LIPID PANEL
Cholesterol: 188 mg/dL (ref <200)
HDL: 109 mg/dL (ref >=50)
LDL Cholesterol (Calc): 63 mg/dL
Non-HDL Cholesterol (Calc): 79 mg/dL (ref <130)
Total CHOL/HDL Ratio: 1.7 (calc) (ref <5.0)
Triglycerides: 77 mg/dL (ref <150)

## 2024-05-29 LAB — CBC WITH DIFFERENTIAL/PLATELET
Absolute Lymphocytes: 2485 {cells}/uL (ref 850–3900)
Absolute Monocytes: 648 {cells}/uL (ref 200–950)
Basophils Absolute: 57 {cells}/uL (ref 0–200)
Basophils Relative: 0.7 %
Eosinophils Absolute: 82 {cells}/uL (ref 15–500)
Eosinophils Relative: 1 %
HCT: 37.5 % (ref 35.0–45.0)
Hemoglobin: 12.5 g/dL (ref 11.7–15.5)
MCH: 30 pg (ref 27.0–33.0)
MCHC: 33.3 g/dL (ref 32.0–36.0)
MCV: 89.9 fL (ref 80.0–100.0)
MPV: 11.2 fL (ref 7.5–12.5)
Monocytes Relative: 7.9 %
Neutro Abs: 4928 {cells}/uL (ref 1500–7800)
Neutrophils Relative %: 60.1 %
Platelets: 302 Thousand/uL (ref 140–400)
RBC: 4.17 Million/uL (ref 3.80–5.10)
RDW: 12.4 % (ref 11.0–15.0)
Total Lymphocyte: 30.3 %
WBC: 8.2 Thousand/uL (ref 3.8–10.8)

## 2024-05-29 LAB — URINE CULTURE
MICRO NUMBER:: 17188708
Result:: NO GROWTH
SPECIMEN QUALITY:: ADEQUATE

## 2024-05-29 LAB — TSH: TSH: 0.9 m[IU]/L (ref 0.40–4.50)

## 2024-05-31 ENCOUNTER — Ambulatory Visit: Payer: Self-pay | Admitting: Family

## 2024-06-03 ENCOUNTER — Telehealth: Payer: Self-pay

## 2024-06-03 DIAGNOSIS — E871 Hypo-osmolality and hyponatremia: Secondary | ICD-10-CM

## 2024-06-03 NOTE — Telephone Encounter (Signed)
 Patient only needs to be schedule with provider, the provider will send patient to lab and assure all orders placed, after seeing them to assess oxygen levels

## 2024-06-03 NOTE — Telephone Encounter (Signed)
 Copied from CRM [id number]. Topic: Clinical - Request for Lab/Test Order >> Jun 03, 2024 10:44 AM Chiquita SQUIBB wrote: Reason for CRM: Patients daughter Silvano is calling in to schedule lab work to recheck sodium levels BMP as recommended in the [facility] message, orders have not been placed yet, please place these orders and notify [facility] for scheduling. Silvano would like to schedule the office visit for the oxygen check as well at the same time.

## 2024-06-05 ENCOUNTER — Other Ambulatory Visit: Payer: Self-pay | Admitting: Sports Medicine

## 2024-06-05 NOTE — Progress Notes (Signed)
 Provider: Roxan Plough FNP-C   [name], Roxan BROCKS, NP  Patient Care Team: Keondra Haydu, Roxan BROCKS, NP as PCP - General (Family Medicine) Jordan, Peter M, MD as PCP - Cardiology (Cardiology) Nathanael Deatrice Barrio, MD as Referring Physician  Extended Emergency Contact Information Primary Emergency Contact: [facility] Address: [address] [place], [place] [address] [place] Phone: [phone] Relation: Daughter  Code Status:  Full Code  Goals of care: Advanced Directive information    05/28/2024    2:05 PM  Advanced Directives  Does Patient Have a Medical Advance Directive? Yes  Type of Estate Agent of [place];Living will;Out of facility DNR (pink MOST or yellow form)  Does patient want to make changes to medical advance directive? No - Patient declined  Copy of Healthcare Power of Attorney in Chart? Yes - validated most recent copy scanned in chart (See row information)     Chief Complaint  Patient presents with   Medical Management of Chronic Issues    Routine Follow up.   Discussed the use of AI scribe software for clinical note transcription with the patient, who gave verbal consent to proceed.  History of Present Illness   Barbara Mcconnell is an 89 year old female who presents for a routine six-month follow-up visit and blood work. She is accompanied by her daughter, who is also her primary caregiver.  She experiences poor sleep, describing today as a particularly bad day and stating she didn't sleep well. She goes to bed at 10 PM and wakes up at 6 AM, but feels this is insufficient. She wakes up twice at night to use the bathroom.  Her oxygen saturation levels fluctuate, with recent readings mostly in the low 90s, occasionally dropping to 86%. These measurements are taken during mid-morning checks. No shortness of breath is reported.  She reports constipation, having reduced Colace from twice daily to once daily due to  diarrhea, but this is now ineffective. She has Miralax  available as needed but has difficulty accessing it. Her daughter confirms Miralax  is listed as a PRN medication.  Her current medications include rosuvastatin  20 mg for cholesterol, Bystolic  2.5 mg for blood pressure, lisinopril  10 mg twice daily, furosemide  20 mg for swelling, Allegra  60 mg for allergies, ferrous sulfate  for iron , Lexapro  for anxiety and depression, and an albuterol  inhaler as needed. She discontinued gabapentin  due to side effects and ineffectiveness for back pain.  She has advanced directives in place, including a DNR and comfort care preferences. She states 'I'm ready to go,' but does not feel depressed and is content with her current state. No burning or itching during urination or recent falls are reported. She uses the bathroom frequently during the day due to Lasix .   Past Medical History:  Diagnosis Date   Allergies    Anxiety    panic attacks   Aortic stenosis    Arthritis    Bronchiectasis with (acute) exacerbation (HCC)    CAD (coronary artery disease)    non obst CAD   Depression due to acute stroke (HCC)    Dizziness    Elevated liver enzymes    Fall    Fluid overload    GERD (gastroesophageal reflux disease)    Heart murmur    Hemorrhoids    History of mammogram    Hyperlipemia    Hypertension    Internal carotid artery stenosis    OCD (obsessive compulsive disorder)  Orthostatic hypotension    Other cirrhosis of liver (HCC)    PAF (paroxysmal atrial fibrillation) (HCC)    Pericardial effusion    Physical deconditioning    Renal insufficiency    Rheumatic heart failure (congestive) (HCC)    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 Wendel & Dr. Lucas   Seasonal allergies    Severe aortic stenosis    Stroke [facility])    Takotsubo cardiomyopathy    Past Surgical History:  Procedure Laterality Date    COLONOSCOPY     EYE SURGERY Bilateral    cataracts removed   INTRAOPERATIVE TRANSTHORACIC ECHOCARDIOGRAM N/A 08/03/2021   Procedure: INTRAOPERATIVE TRANSTHORACIC ECHOCARDIOGRAM;  Surgeon: Wendel Lurena POUR, MD;  Location: [facility] INVASIVE CV LAB;  Service: Open Heart Surgery;  Laterality: N/A;   IR CT HEAD LTD  03/25/2022   IR PERCUTANEOUS ART THROMBECTOMY/INFUSION INTRACRANIAL INC DIAG ANGIO  03/25/2022   IR US  GUIDE VASC ACCESS RIGHT  03/25/2022   PERICARDIOCENTESIS N/A 07/28/2021   Procedure: PERICARDIOCENTESIS;  Surgeon: Wendel Lurena POUR, MD;  Location: [facility] INVASIVE CV LAB;  Service: Cardiovascular;  Laterality: N/A;   RADIOLOGY WITH ANESTHESIA N/A 03/25/2022   Procedure: IR WITH ANESTHESIA;  Surgeon: Radiologist, Medication, MD;  Location: [facility] OR;  Service: Radiology;  Laterality: N/A;   RIGHT/LEFT HEART CATH AND CORONARY ANGIOGRAPHY N/A 05/13/2019   Procedure: RIGHT/LEFT HEART CATH AND CORONARY ANGIOGRAPHY;  Surgeon: Jordan, Peter M, MD;  Location: [facility] INVASIVE CV LAB;  Service: Cardiovascular;  Laterality: N/A;   RIGHT/LEFT HEART CATH AND CORONARY ANGIOGRAPHY N/A 07/23/2021   Procedure: RIGHT/LEFT HEART CATH AND CORONARY ANGIOGRAPHY;  Surgeon: Dann Candyce RAMAN, MD;  Location: [facility] INVASIVE CV LAB;  Service: Cardiovascular;  Laterality: N/A;   TONSILLECTOMY     TRANSCATHETER AORTIC VALVE REPLACEMENT, TRANSFEMORAL N/A 08/03/2021   Procedure: TRANSCATHETER AORTIC VALVE REPLACEMENT, TRANSFEMORAL;  Surgeon: Thukkani, Arun K, MD;  Location: [facility] INVASIVE CV LAB;  Service: Open Heart Surgery;  Laterality: N/A;   UPPER GI ENDOSCOPY      Allergies  Allergen Reactions   Codeine Nausea And Vomiting   Cyclobenzaprine  Other (See Comments)    Unknown reaction   Iodinated Contrast Media Nausea Only   Prednisone  Other (See Comments)    Sores in the mouth    Allergies as of 05/28/2024       Reactions   Codeine Nausea And Vomiting   Cyclobenzaprine  Other (See Comments)   Unknown reaction   Iodinated  Contrast Media Nausea Only   Prednisone  Other (See Comments)   Sores in the mouth        Medication List        Accurate as of May 28, 2024 11:59 PM. If you have any questions, ask your nurse or doctor.          STOP taking these medications    gabapentin  100 MG capsule Commonly known as: Neurontin  Stopped by: Laureano Hetzer C Gidget Quizhpi       TAKE these medications    acetaminophen  500 MG tablet Commonly known as: TYLENOL  Take 500-1,000 mg by mouth every 6 (six) hours as needed for moderate pain.   Acid Reducer 10 MG tablet Generic drug: famotidine  TAKE (1) TABLET BY MOUTH TWICE DAILY FOR GERD.   albuterol  108 (90 Base) MCG/ACT inhaler Commonly known as: VENTOLIN  HFA Inhale 2 puffs into the lungs every 4 (four) hours as needed for wheezing or shortness of breath (seasonal  allergies).   ASPERCREME EX Apply 1 application topically daily as needed (knee pain).   docusate sodium  100 MG capsule Commonly known as: COLACE TAKE (1) CAPSULE BY MOUTH ONCE DAILY FOR CONSTIPATION.   EMERGEN-C VITAMIN C PO Take 1 packet by mouth daily. Mix with water What changed: Another medication with the same name was removed. Continue taking this medication, and follow the directions you see here. Changed by: Zophia Marrone C Viviane Semidey   escitalopram  10 MG tablet Commonly known as: LEXAPRO  TAKE (1) TABLET BY MOUTH ONCE DAILY.   fexofenadine  60 MG tablet Commonly known as: ALLEGRA  TAKE (1) TABLET BY MOUTH ONCE EVERY MORNING.   furosemide  20 MG tablet Commonly known as: LASIX  TAKE (1/2) TABLET (10MG ) BY MOUTH ONCE DAILY IN THE MORNING.   Iron  325 (65 Fe) MG Tabs TAKE (1) TABLET BY MOUTH THREE TIMES WEEKLY. **MON, WED, FRI**   Lidocaine  HCl 4 % Crea Commonly known as: Aspercreme Lidocaine  Apply 1 Application topically daily as needed.   lisinopril  10 MG tablet Commonly known as: ZESTRIL  TAKE (1) TABLET BY MOUTH TWICE DAILY.   montelukast  10 MG tablet Commonly known as: SINGULAIR  TAKE  (1) TABLET BY MOUTH ONCE DAILY.   nebivolol  2.5 MG tablet Commonly known as: BYSTOLIC  TAKE (1) TABLET BY MOUTH ONCE DAILY.   oxymetazoline  0.05 % nasal spray Commonly known as: AFRIN USE (2) SPRAYS IN EACH NOSTRIL TWICE DAILY AS NEEDED FOR NOSE BLEED.   polyethylene glycol powder 17 GM/SCOOP powder Commonly known as: GLYCOLAX /MIRALAX  Take 17 g by mouth daily as needed for moderate constipation. Hold for loose stool   rosuvastatin  20 MG tablet Commonly known as: CRESTOR  TAKE (1) TABLET BY MOUTH AT BEDTIME.   UNABLE TO FIND Order: Sherlynn Madden, MD to [facility] (Selected Message) 05/21/24 10:42 AM Ok for the verbal order to check pulse oximetry every 4 hrs  If patient is hypoxic and her o2 goes below 90% , recommend transitioning to  hospice as pt does not going to hospital or aggressive interventions as pt is focussed on more comfort care.   Xarelto  15 MG Tabs tablet Generic drug: Rivaroxaban  TAKE (1) TABLET BY MOUTH ONCE DAILY.        Review of Systems  Constitutional:  Negative for appetite change, chills, fatigue, fever and unexpected weight change.  HENT:  Negative for congestion, dental problem, ear discharge, ear pain, facial swelling, hearing loss, nosebleeds, postnasal drip, rhinorrhea, sinus pressure, sinus pain, sneezing, sore throat, tinnitus and trouble swallowing.   Eyes:  Negative for pain, discharge, redness, itching and visual disturbance.  Respiratory:  Negative for cough, chest tightness, shortness of breath and wheezing.   Cardiovascular:  Negative for chest pain, palpitations and leg swelling.  Gastrointestinal:  Negative for abdominal distention, abdominal pain, blood in stool, constipation, diarrhea, nausea and vomiting.  Endocrine: Negative for cold intolerance, heat intolerance, polydipsia, polyphagia and polyuria.  Genitourinary:  Positive for frequency. Negative for difficulty urinating, dysuria, flank pain and urgency.  Musculoskeletal:   Positive for gait problem. Negative for arthralgias, back pain, joint swelling, myalgias, neck pain and neck stiffness.  Skin:  Negative for color change, pallor, rash and wound.  Neurological:  Negative for dizziness, syncope, speech difficulty, weakness, light-headedness, numbness and headaches.  Hematological:  Does not bruise/bleed easily.  Psychiatric/Behavioral:  Positive for sleep disturbance. Negative for agitation, behavioral problems, confusion, hallucinations, self-injury and suicidal ideas. The patient is not nervous/anxious.     Immunization History  Administered Date(s) Administered   Fluad Quad(high Dose 65+) 04/07/2019,  05/13/2022   Fluad Trivalent(High Dose 65+) 04/18/2023   INFLUENZA, HIGH DOSE SEASONAL PF 04/20/2017, 04/07/2019, 03/25/2020, 04/27/2021   Influenza Split 05/10/2006, 04/25/2007, 04/08/2008, 11/10/2008   Influenza Whole 04/25/2023   Influenza-Unspecified 05/10/2006, 04/25/2007, 04/08/2008, 07/09/2008, 11/10/2008, 03/31/2015, 04/20/2018, 04/07/2019, 05/15/2024   PFIZER Comirnaty ETTERGray Top)Covid-19 Tri-Sucrose Vaccine 08/14/2019, 09/04/2019, 04/20/2020, 10/25/2020, 04/12/2021   PPD Test 05/12/2022   Pfizer(Comirnaty )Fall Seasonal Vaccine 12 years and older 04/07/2023   Pneumococcal Conjugate-13 09/29/2015   Pneumococcal Polysaccharide-23 03/12/2007   Respiratory Syncytial Virus Vaccine ,Recomb Aduvanted(Arexvy ) 05/09/2023   Tdap 07/14/2014   Zoster Recombinant(Shingrix) 12/10/2019, 02/08/2020   Zoster, Live 08/18/2004, 01/17/2007   Pertinent  Health Maintenance Due  Topic Date Due   DEXA SCAN  12/04/2024 (Originally 02/25/2002)   Influenza Vaccine  Completed      09/05/2023    9:53 AM 09/05/2023   10:17 AM 12/04/2023   11:02 AM 12/05/2023    1:24 PM 05/28/2024    2:03 PM  Fall Risk  Falls in the past year? 0 0 1 1 0  Was there an injury with Fall? 0 0 1  0  Fall Risk Category Calculator 0 0 2   0  Patient at Risk for Falls Due to No Fall Risks    No Fall  Risks  Fall risk Follow up Falls evaluation completed    Falls evaluation completed     Patient-reported   Functional Status Survey:    Vitals:   05/28/24 1411  BP: 122/78  Pulse: 70  Resp: 18  Temp: 97.8 F (36.6 C)  SpO2: 94%  Weight: 111 lb 6.4 oz (50.5 kg)  Height: 5' (1.524 m)   Body mass index is 21.76 kg/m. Physical Exam VITALS: T- 97.8, P- 70, BP- 2/78, SaO2- 94% GENERAL: Alert, cooperative, well developed, no acute distress. HEENT: Normocephalic, normal oropharynx, moist mucous membranes. CHEST: Clear to auscultation bilaterally, no wheezes, rhonchi, or crackles. CARDIOVASCULAR: Normal heart rate and rhythm, S1 and S2 normal without murmurs. ABDOMEN: Soft, non-tender, non-distended, without organomegaly, normal bowel sounds. EXTREMITIES: No cyanosis, no edema, no pain in extremities. NEUROLOGICAL: Cranial nerves grossly intact, moves all extremities without gross motor or sensory deficit.  SKIN: No rash,no lesion or erythema   PSYCHIATRY/BEHAVIORAL: Mood stable    Labs reviewed: Recent Labs    12/12/23 1137 02/21/24 1439 05/28/24 1509  NA 135 133* 130*  K 4.7 4.8 4.8  CL 95* 96* 92*  CO2 19* 34* 30  GLUCOSE 81 91 89  BUN 14 17 17   CREATININE 1.06* 0.79 0.92  CALCIUM  9.4 9.8 9.8   Recent Labs    12/12/23 1137 05/28/24 1509  AST 54* 52*  ALT 22 26  ALKPHOS 65  --   BILITOT 1.3* 1.3*  PROT 6.9 6.8  ALBUMIN 4.2  --    Recent Labs    12/12/23 1137 02/21/24 1439 05/28/24 1509  WBC 6.7 6.0 8.2  NEUTROABS 4.3 3,108 4,928  HGB 11.7 11.7 12.5  HCT 36.5 37.4 37.5  MCV 93 93.7 89.9  PLT 277 234 302   Lab Results  Component Value Date   TSH 0.90 05/28/2024   Lab Results  Component Value Date   HGBA1C 5.4 03/26/2022   Lab Results  Component Value Date   CHOL 188 05/28/2024   HDL 109 05/28/2024   LDLCALC 63 05/28/2024   TRIG 77 05/28/2024   CHOLHDL 1.7 05/28/2024    Significant Diagnostic Results in last 30 days:  No results  found.  Assessment/Plan  Adult Wellness Visit Routine  wellness visit with blood pressure at 120/78 mmHg, oxygen saturation at 94%, heart rate at 70 bpm, and temperature at 97.94F. Reports fatigue and difficulty sleeping, waking twice at night to use the bathroom. No shortness of breath. Declines further oxygen saturation monitoring and palliative care involvement. - Ordered routine lab work - Discontinued oxygen saturation monitoring - Discontinued palliative care involvement unless requested  Comfort care and advanced directives (including DNR status) Opted for comfort care and DNR status, preferring no hospitalization or aggressive interventions. Advanced directives updated to reflect these wishes, including no feeding tube and limited use of antibiotics and IV fluids. Declines hospice involvement unless at the end of life. - Scanned and updated advanced directives in medical chart - Ensured DNR status is documented - Discontinued hospice involvement unless at the end of life  Essential hypertension Blood pressure well-controlled at 120/78 mmHg with current medication regimen. - Continue current antihypertensive medications  Chronic diastolic congestive heart failure Furosemide  effective in managing symptoms. - Continue furosemide  for fluid management  Paroxysmal atrial fibrillation No recent episodes reported. Current management appears effective. - Continue current management plan  Iron  deficiency anemia Taking ferrous sulfate  for iron  deficiency anemia. Constipation likely related to iron  supplementation. - Continue ferrous sulfate  - Use stool softener as needed for constipation  Constipation Experiencing constipation, likely exacerbated by iron  supplementation. Previously managed with Colace, now considering Miralax . - Use Miralax  as needed for constipation - Continue Colace as needed  Frequency of micturition Increased frequency of urination, possibly related to  furosemide  use. No signs of urinary tract infection. - Ordered urine culture to rule out infection  Unspecified sequelae of cerebral infarction No new neurological symptoms reported. Condition well-managed. - Continue current management plan  Depression Reports no significant depressive symptoms. Lexapro  effective in managing symptoms. - Continue Lexapro   Gastroesophageal reflux disease Condition well-managed with current medication regimen. - Continue current GERD medications   Family/ staff Communication: Reviewed plan of care with patient verbalized understanding   Labs/tests ordered: None   Next Appointment : Return in about 4 months (around 09/25/2024).   Spent 45 minutes of Face to face and non-face to face with patient  >50% time spent counseling; reviewing medical record; tests; labs; documentation and developing future plan of care.   Roxan JAYSON Plough, NP

## 2024-06-18 ENCOUNTER — Ambulatory Visit: Admitting: Family

## 2024-06-18 ENCOUNTER — Encounter: Payer: Self-pay | Admitting: Family

## 2024-06-18 VITALS — BP 124/74 | HR 71 | Temp 98.2°F | Resp 17 | Ht 60.0 in | Wt 109.0 lb

## 2024-06-18 DIAGNOSIS — R0902 Hypoxemia: Secondary | ICD-10-CM

## 2024-06-18 DIAGNOSIS — D509 Iron deficiency anemia, unspecified: Secondary | ICD-10-CM | POA: Diagnosis not present

## 2024-06-18 DIAGNOSIS — E871 Hypo-osmolality and hyponatremia: Secondary | ICD-10-CM

## 2024-06-18 LAB — BASIC METABOLIC PANEL WITH GFR
BUN/Creatinine Ratio: 13 (calc) (ref 6–22)
BUN: 14 mg/dL (ref 7–25)
CO2: 32 mmol/L (ref 20–32)
Calcium: 9.7 mg/dL (ref 8.6–10.4)
Chloride: 96 mmol/L — ABNORMAL LOW (ref 98–110)
Creat: 1.05 mg/dL — ABNORMAL HIGH (ref 0.60–0.95)
Glucose, Bld: 69 mg/dL (ref 65–139)
Potassium: 4.3 mmol/L (ref 3.5–5.3)
Sodium: 135 mmol/L (ref 135–146)
eGFR: 51 mL/min/1.73m2 — ABNORMAL LOW (ref >=60)

## 2024-06-18 MED ORDER — IRON 325 (65 FE) MG PO TABS: ORAL_TABLET | ORAL | 0 refills | Status: AC

## 2024-06-19 ENCOUNTER — Ambulatory Visit: Payer: Self-pay | Admitting: Family

## 2024-06-19 ENCOUNTER — Telehealth: Payer: Self-pay

## 2024-06-19 NOTE — Telephone Encounter (Signed)
 Patient refused portable concentrator during the visit.Please verify with patient if in agreement with the large concentrator then will write new orders for oxygen. Patient's oxygen saturation at rest  are normal but drops with exertion.

## 2024-06-19 NOTE — Telephone Encounter (Signed)
 Copied from CRM #[id number]. Topic: Clinical - Order For Equipment >> Jun 19, 2024 10:39 AM Graeme ORN wrote: Reason for CRM: [employer] called. States she spoke with daughter of patient who says she has received several calls today from [facility] about order. States there is some confusion about it. [employer] called to clarify that they have received corrected or and are processing it and will deliver to patient today. Nothing further needed at this time. Just wanted to address any confusion. Thank You

## 2024-06-19 NOTE — Telephone Encounter (Signed)
 Barbara Mcconnell would like to speak with Barbara Mcconnell directly as  There is a lot of confusion since appointment yesterday.   1.) Based on labs and visit yesterday, does patient need oxygen supplementation now? Barbara Mcconnell states the message Roxan gave on the lab results was really confusing to interpret   2.) [employer] received order for oxygen however medicare guidelines require the larger concentrator in combination with the portable one and the order would need to be changed and resubmitted if patient needs oxygen

## 2024-06-19 NOTE — Telephone Encounter (Signed)
 Please see message recorded within this encounter at 10:23 am

## 2024-06-19 NOTE — Telephone Encounter (Signed)
 Noted

## 2024-06-21 ENCOUNTER — Other Ambulatory Visit: Payer: Self-pay | Admitting: Sports Medicine

## 2024-06-21 DIAGNOSIS — T7840XA Allergy, unspecified, initial encounter: Secondary | ICD-10-CM

## 2024-06-21 DIAGNOSIS — I10 Essential (primary) hypertension: Secondary | ICD-10-CM

## 2024-06-23 NOTE — Progress Notes (Signed)
 Provider: Roxan Plough FNP-C  Alexandru Moorer, Roxan BROCKS, NP  Patient Care Team: Yuriko Portales, Roxan BROCKS, NP as PCP - General (Family Medicine) Jordan, Peter M, MD as PCP - Cardiology (Cardiology) Nathanael Deatrice Barrio, MD as Referring Physician  Extended Emergency Contact Information Primary Emergency Contact: [facility] Address: [address] [place], [place] [address] [place] Phone: [phone] Relation: Daughter  Code Status:  DNR Goals of care: Advanced Directive information    05/28/2024    2:05 PM  Advanced Directives  Does Patient Have a Medical Advance Directive? Yes  Type of Estate Agent of [place];Living will;Out of facility DNR (pink MOST or yellow form)  Does patient want to make changes to medical advance directive? No - Patient declined  Copy of Healthcare Power of Attorney in Chart? Yes - validated most recent copy scanned in chart (See row information)     Chief Complaint  Patient presents with   Medical Management of Chronic Issues    Labs/Order for oxygen.    Discussed the use of AI scribe software for clinical note transcription with the patient, who gave verbal consent to proceed.  History of Present Illness   Barbara Mcconnell is an 89 year old female who presents for re-evaluation of her sodium and chloride levels and assessment of her oxygen needs. She is accompanied by her daughter, who is also her primary caregiver.  She is here for a follow-up to recheck her sodium and chloride levels. Previously, her sodium was at 130 mmol/L and chloride at 92 mmol/L.  She experiences intermittent low oxygen levels, with two instances of her oxygen saturation dropping to 85% while resting in the mid-morning. Her oxygen saturation was 98% during the current visit. She reports fatigue and some shortness of breath. She is concerned about the need for supplemental oxygen, especially when walking, but prefers not to use a large  oxygen tank.  She has a persistent mild cough but no significant shortness of breath while walking to the dining room or bathroom, although she finds the distance challenging. She does not use a walker with a seat and prefers not to have one at this time. Does have a rolling walker that she uses.  Her daughter mentions that she previously had orthostatic hypotension, and that a prior clinician thought it was resolved after discontinuing metoprolol . Her daughter reports that she does not currently feel any symptoms of low blood pressure.  She is currently taking iron  supplements three times a week, but her daughter requests to reduce this to once a week due to constipation issues. Her iron  levels were reportedly good at the last check.  Past Medical History:  Diagnosis Date   Allergies    Anxiety    panic attacks   Aortic stenosis    Arthritis    Bronchiectasis with (acute) exacerbation (HCC)    CAD (coronary artery disease)    non obst CAD   Depression due to acute stroke (HCC)    Dizziness    Elevated liver enzymes    Fall    Fluid overload    GERD (gastroesophageal reflux disease)    Heart murmur    Hemorrhoids    History of mammogram    Hyperlipemia    Hypertension    Internal carotid artery stenosis    OCD (obsessive compulsive disorder)    Orthostatic hypotension    Other cirrhosis of liver (HCC)    PAF (  paroxysmal atrial fibrillation) (HCC)    Pericardial effusion    Physical deconditioning    Renal insufficiency    Rheumatic heart failure (congestive) (HCC)    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 Wendel & Dr. Lucas   Seasonal allergies    Severe aortic stenosis    Stroke [facility])    Takotsubo cardiomyopathy    Past Surgical History:  Procedure Laterality Date   COLONOSCOPY     EYE SURGERY Bilateral    cataracts removed   INTRAOPERATIVE TRANSTHORACIC ECHOCARDIOGRAM N/A  08/03/2021   Procedure: INTRAOPERATIVE TRANSTHORACIC ECHOCARDIOGRAM;  Surgeon: Wendel Lurena POUR, MD;  Location: [facility] INVASIVE CV LAB;  Service: Open Heart Surgery;  Laterality: N/A;   IR CT HEAD LTD  03/25/2022   IR PERCUTANEOUS ART THROMBECTOMY/INFUSION INTRACRANIAL INC DIAG ANGIO  03/25/2022   IR US  GUIDE VASC ACCESS RIGHT  03/25/2022   PERICARDIOCENTESIS N/A 07/28/2021   Procedure: PERICARDIOCENTESIS;  Surgeon: Wendel Lurena POUR, MD;  Location: [facility] INVASIVE CV LAB;  Service: Cardiovascular;  Laterality: N/A;   RADIOLOGY WITH ANESTHESIA N/A 03/25/2022   Procedure: IR WITH ANESTHESIA;  Surgeon: Radiologist, Medication, MD;  Location: [facility] OR;  Service: Radiology;  Laterality: N/A;   RIGHT/LEFT HEART CATH AND CORONARY ANGIOGRAPHY N/A 05/13/2019   Procedure: RIGHT/LEFT HEART CATH AND CORONARY ANGIOGRAPHY;  Surgeon: Jordan, Peter M, MD;  Location: [facility] INVASIVE CV LAB;  Service: Cardiovascular;  Laterality: N/A;   RIGHT/LEFT HEART CATH AND CORONARY ANGIOGRAPHY N/A 07/23/2021   Procedure: RIGHT/LEFT HEART CATH AND CORONARY ANGIOGRAPHY;  Surgeon: Dann Candyce RAMAN, MD;  Location: [facility] INVASIVE CV LAB;  Service: Cardiovascular;  Laterality: N/A;   TONSILLECTOMY     TRANSCATHETER AORTIC VALVE REPLACEMENT, TRANSFEMORAL N/A 08/03/2021   Procedure: TRANSCATHETER AORTIC VALVE REPLACEMENT, TRANSFEMORAL;  Surgeon: Thukkani, Arun K, MD;  Location: [facility] INVASIVE CV LAB;  Service: Open Heart Surgery;  Laterality: N/A;   UPPER GI ENDOSCOPY      Allergies  Allergen Reactions   Codeine Nausea And Vomiting   Cyclobenzaprine  Other (See Comments)    Unknown reaction   Iodinated Contrast Media Nausea Only   Prednisone  Other (See Comments)    Sores in the mouth    Outpatient Encounter Medications as of 06/18/2024  Medication Sig   acetaminophen  (TYLENOL ) 500 MG tablet Take 500-1,000 mg by mouth every 6 (six) hours as needed for moderate pain.   ACID REDUCER 10 MG tablet TAKE (1) TABLET BY MOUTH TWICE DAILY FOR GERD.    albuterol  (VENTOLIN  HFA) 108 (90 Base) MCG/ACT inhaler Inhale 2 puffs into the lungs every 4 (four) hours as needed for wheezing or shortness of breath (seasonal allergies).   docusate sodium  (COLACE) 100 MG capsule TAKE (1) CAPSULE BY MOUTH ONCE DAILY FOR CONSTIPATION.   escitalopram  (LEXAPRO ) 10 MG tablet TAKE (1) TABLET BY MOUTH ONCE DAILY.   fexofenadine  (ALLEGRA ) 60 MG tablet TAKE (1) TABLET BY MOUTH ONCE EVERY MORNING.   furosemide  (LASIX ) 20 MG tablet TAKE (1/2) TABLET (10MG ) BY MOUTH ONCE DAILY IN THE MORNING.   Lidocaine  HCl (ASPERCREME LIDOCAINE ) 4 % CREA Apply 1 Application topically daily as needed.   lisinopril  (ZESTRIL ) 10 MG tablet TAKE (1) TABLET BY MOUTH TWICE DAILY.   montelukast  (SINGULAIR ) 10 MG tablet TAKE (1) TABLET BY MOUTH ONCE DAILY.   Multiple Vitamins-Minerals (EMERGEN-C VITAMIN C PO) Take 1 packet by mouth daily. Mix with water   nebivolol  (BYSTOLIC ) 2.5 MG tablet  TAKE (1) TABLET BY MOUTH ONCE DAILY.   oxymetazoline  (AFRIN) 0.05 % nasal spray USE (2) SPRAYS IN EACH NOSTRIL TWICE DAILY AS NEEDED FOR NOSE BLEED. (Patient taking differently: as needed.)   polyethylene glycol powder (GLYCOLAX /MIRALAX ) 17 GM/SCOOP powder Take 17 g by mouth daily as needed for moderate constipation. Hold for loose stool   rosuvastatin  (CRESTOR ) 20 MG tablet TAKE (1) TABLET BY MOUTH AT BEDTIME.   Trolamine Salicylate (ASPERCREME EX) Apply 1 application topically daily as needed (knee pain).   XARELTO  15 MG TABS tablet TAKE (1) TABLET BY MOUTH ONCE DAILY.   [DISCONTINUED] Ferrous Sulfate  (IRON ) 325 (65 Fe) MG TABS TAKE (1) TABLET BY MOUTH THREE TIMES WEEKLY. **MON, WED, FRI**   Ferrous Sulfate  (IRON ) 325 (65 Fe) MG TABS TAKE (1) TABLET BY MOUTH once a WEEK.   [DISCONTINUED] UNABLE TO FIND Order: Sherlynn Madden, MD to [facility] (Selected Message) 05/21/24 10:42 AM Ok for the verbal order to check pulse oximetry every 4 hrs  If patient is hypoxic and her o2 goes below 90% , recommend  transitioning to  hospice as pt does not going to hospital or aggressive interventions as pt is focussed on more comfort care. (Patient not taking: Reported on 06/18/2024)   No facility-administered encounter medications on file as of 06/18/2024.    Review of Systems  Constitutional:  Negative for appetite change, chills, fatigue, fever and unexpected weight change.  HENT:  Negative for congestion, dental problem, ear discharge, ear pain, facial swelling, hearing loss, nosebleeds, postnasal drip, rhinorrhea, sinus pressure, sinus pain, sneezing, sore throat, tinnitus and trouble swallowing.   Eyes:  Negative for pain, discharge, redness, itching and visual disturbance.  Respiratory:  Negative for cough, chest tightness, shortness of breath and wheezing.   Cardiovascular:  Negative for chest pain, palpitations and leg swelling.  Gastrointestinal:  Negative for abdominal distention, abdominal pain, blood in stool, constipation, diarrhea, nausea and vomiting.  Endocrine: Negative for cold intolerance, heat intolerance, polydipsia, polyphagia and polyuria.  Genitourinary:  Negative for difficulty urinating, dysuria, flank pain, frequency and urgency.  Musculoskeletal:  Negative for arthralgias, back pain, gait problem, joint swelling, myalgias, neck pain and neck stiffness.  Skin:  Negative for color change, pallor, rash and wound.  Neurological:  Negative for dizziness, syncope, speech difficulty, weakness, light-headedness, numbness and headaches.  Hematological:  Does not bruise/bleed easily.  Psychiatric/Behavioral:  Negative for agitation, behavioral problems, confusion, hallucinations, self-injury, sleep disturbance and suicidal ideas. The patient is nervous/anxious.     Immunization History  Administered Date(s) Administered   Fluad Quad(high Dose 65+) 04/07/2019, 05/13/2022   Fluad Trivalent(High Dose 65+) 04/18/2023   INFLUENZA, HIGH DOSE SEASONAL PF 04/20/2017, 04/07/2019, 03/25/2020,  04/27/2021   Influenza Split 05/10/2006, 04/25/2007, 04/08/2008, 11/10/2008   Influenza Whole 04/25/2023   Influenza-Unspecified 05/10/2006, 04/25/2007, 04/08/2008, 07/09/2008, 11/10/2008, 03/31/2015, 04/20/2018, 04/07/2019, 05/15/2024   PFIZER Comirnaty ETTERGray Top)Covid-19 Tri-Sucrose Vaccine 08/14/2019, 09/04/2019, 04/20/2020, 10/25/2020, 04/12/2021   PPD Test 05/12/2022   Pfizer(Comirnaty )Fall Seasonal Vaccine 12 years and older 04/07/2023   Pneumococcal Conjugate-13 09/29/2015   Pneumococcal Polysaccharide-23 03/12/2007   Respiratory Syncytial Virus Vaccine ,Recomb Aduvanted(Arexvy ) 05/09/2023   Tdap 07/14/2014   Zoster Recombinant(Shingrix) 12/10/2019, 02/08/2020   Zoster, Live 08/18/2004, 01/17/2007   Pertinent  Health Maintenance Due  Topic Date Due   Bone Density Scan  12/04/2024 (Originally 02/25/2002)   Influenza Vaccine  Completed      09/05/2023    9:53 AM 09/05/2023   10:17 AM 12/04/2023   11:02 AM 12/05/2023    1:24 PM  05/28/2024    2:03 PM  Fall Risk  Falls in the past year? 0 0 1 1 0  Was there an injury with Fall? 0 0 1  0  Fall Risk Category Calculator 0 0 2   0  Patient at Risk for Falls Due to No Fall Risks    No Fall Risks  Fall risk Follow up Falls evaluation completed    Falls evaluation completed     Patient-reported   Functional Status Survey:    Vitals:   06/18/24 1045  BP: 124/74  Pulse: 71  Resp: 17  Temp: 98.2 F (36.8 C)  SpO2: 98%  Weight: 109 lb (49.4 kg)  Height: 5' (1.524 m)   Body mass index is 21.29 kg/m. Physical Exam  VITALS: SaO2- 98% at rest;86% on room air and 93% with supplemental oxygen 2 liters via Nasal cannula  GENERAL: Alert, cooperative, well developed, no acute distress. HEENT: Normocephalic, normal oropharynx, moist mucous membranes. CHEST: Clear to auscultation bilaterally, no wheezes, rhonchi, or crackles. CARDIOVASCULAR: Normal heart rate and rhythm, S1 and S2 normal without murmurs. ABDOMEN: Soft, non-tender,  non-distended, without organomegaly, normal bowel sounds. EXTREMITIES: No cyanosis, edema, or swelling in the legs. NEUROLOGICAL: Cranial nerves grossly intact, moves all extremities without gross motor or sensory deficit.  SKIN: No rash,no lesion or erythema   PSYCHIATRY/BEHAVIORAL: Mood anxious   Labs reviewed: Recent Labs    02/21/24 1439 05/28/24 1509 06/18/24 1141  NA 133* 130* 135  K 4.8 4.8 4.3  CL 96* 92* 96*  CO2 34* 30 32  GLUCOSE 91 89 69  BUN 17 17 14   CREATININE 0.79 0.92 1.05*  CALCIUM  9.8 9.8 9.7   Recent Labs    12/12/23 1137 05/28/24 1509  AST 54* 52*  ALT 22 26  ALKPHOS 65  --   BILITOT 1.3* 1.3*  PROT 6.9 6.8  ALBUMIN 4.2  --    Recent Labs    12/12/23 1137 02/21/24 1439 05/28/24 1509  WBC 6.7 6.0 8.2  NEUTROABS 4.3 3,108 4,928  HGB 11.7 11.7 12.5  HCT 36.5 37.4 37.5  MCV 93 93.7 89.9  PLT 277 234 302   Lab Results  Component Value Date   TSH 0.90 05/28/2024   Lab Results  Component Value Date   HGBA1C 5.4 03/26/2022   Lab Results  Component Value Date   CHOL 188 05/28/2024   HDL 109 05/28/2024   LDLCALC 63 05/28/2024   TRIG 77 05/28/2024   CHOLHDL 1.7 05/28/2024    Significant Diagnostic Results in last 30 days:  No results found.  Assessment/Plan  Exertional hypoxemia requiring supplemental oxygen Intermittent exertional hypoxemia with oxygen saturation dropping to 86% on room air during walking, improving to 93% with supplemental oxygen. Oxygen at rest on room air was 98%. No significant shortness of breath at rest, but increased fatigue and weakness possibly related to intermittent hypoxemia. Prefers portable oxygen over large tanks. - Ordered portable oxygen for use during walking at 2 liters via Nasal cannula. Declines any large tank or stationary oxygen. - Instructed to use oxygen when walking, especially to the dining room. - Educated on the use of portable oxygen and its delivery process.  Hyponatremia Chronic  hyponatremia with sodium levels around 130 mEq/L. No immediate intervention required unless sodium levels drop into the 120s. - Rechecked sodium and chloride levels today. - Continue to monitor sodium levels; no action required unless levels drop into the 120s.  Iron  deficiency anemia Latest hemoglobin level has improved  Reduce ferrous sulfate  325 mg from 3 times a week to once a week  Family/ staff Communication: Reviewed plan of care with patient and daughter verbalized understanding  Labs/tests ordered: BMP order in place   Next Appointment: Return if symptoms worsen or fail to improve.   Total time: 30 minutes. Greater than 50% of total time spent doing patient education regarding hyponatremia, hypoxia, iron  deficiency anemia and health maintenance including symptom/medication management.   Roxan JAYSON Plough, NP

## 2024-07-03 ENCOUNTER — Other Ambulatory Visit: Payer: Self-pay | Admitting: Sports Medicine

## 2024-07-12 ENCOUNTER — Other Ambulatory Visit: Payer: Self-pay | Admitting: Family

## 2024-07-17 ENCOUNTER — Other Ambulatory Visit (HOSPITAL_BASED_OUTPATIENT_CLINIC_OR_DEPARTMENT_OTHER): Payer: Self-pay

## 2024-07-23 NOTE — Telephone Encounter (Signed)
 Please advise...  Copied from CRM #[id number]. Topic: Appointments - Scheduling Inquiry for Clinic >> Jul 23, 2024  8:59 AM Graeme ORN wrote: Reason for CRM: Patient daughter  Heidy, [facility]) [phone] called. Would like a call back. States patient received Rx for oxygen - requires re-evaluation in 3 months and new Rx. [facility] stated new Rx due end of Feb. Patient appt not until March. Wants to know if Rx will be approved or if appt needs to be rescheduled. Wants to make sure insurance will cover everything. Thank You

## 2024-07-24 ENCOUNTER — Other Ambulatory Visit: Payer: Self-pay | Admitting: Family

## 2024-07-24 ENCOUNTER — Ambulatory Visit: Payer: Self-pay

## 2024-07-24 ENCOUNTER — Other Ambulatory Visit: Payer: Self-pay | Admitting: Sports Medicine

## 2024-07-24 DIAGNOSIS — R0982 Postnasal drip: Secondary | ICD-10-CM

## 2024-07-24 DIAGNOSIS — F339 Major depressive disorder, recurrent, unspecified: Secondary | ICD-10-CM

## 2024-07-24 NOTE — Telephone Encounter (Signed)
 Will need appointment in February to re-evaluate oxygen saturation at  rest and on exertion with and without oxygen on.

## 2024-07-24 NOTE — Telephone Encounter (Signed)
" °  FYI Only or Action Required?: Action required by provider: request for appointment.  Patient was last seen in primary care on 06/18/2024 by Ngetich, Roxan BROCKS, NP.  Called Nurse Triage reporting Arm bruise.  Symptoms began several days ago.  Interventions attempted: Nothing.  Symptoms are: unchanged. Left bruised from shoulder down, swollen, painful. On Xarelto . Will go to UC, no availability.  Triage Disposition: See HCP Within 4 Hours (Or PCP Triage)  Patient/caregiver understands and will follow disposition?: Yes     Copied from CRM #[id number]. Topic: Clinical - Red Word Triage >> Jul 24, 2024  7:49 AM Carrielelia G wrote: Red Word that prompted transfer to Nurse Triage: Hematoma on left arm from shoulder to wrist, very painful,  Hx of being on blood thinners.  Edema in both ankles Reason for Disposition  Entire arm is swollen  Answer Assessment - Initial Assessment Questions 1. ONSET: When did the pain start?     5 days 2. LOCATION: Where is the pain located?     Left arm 3. PAIN: How bad is the pain? (Scale 0-10; or none, mild, moderate, severe)     severe 4. WORK OR EXERCISE: Has there been any recent work or exercise that involved this part of the body?     no 5. CAUSE: What do you think is causing the arm pain?     Unsure - xarelto  6. OTHER SYMPTOMS: Do you have any other symptoms? (e.g., neck pain, swelling, rash, fever, numbness, weakness)     swelling 7. PREGNANCY: Is there any chance you are pregnant? When was your last menstrual period?     no  Protocols used: Arm Pain-A-AH  "

## 2024-07-24 NOTE — Telephone Encounter (Signed)
 Nicholaus Sonny LABOR, RN to [facility] (Selected Message)     07/24/24  8:11 AM Going to UC, thanks.

## 2024-07-26 ENCOUNTER — Ambulatory Visit: Admitting: Family

## 2024-07-26 ENCOUNTER — Ambulatory Visit: Payer: Self-pay | Admitting: Family

## 2024-07-26 ENCOUNTER — Encounter: Payer: Self-pay | Admitting: Family

## 2024-07-26 ENCOUNTER — Ambulatory Visit
Admission: RE | Admit: 2024-07-26 | Discharge: 2024-07-26 | Disposition: A | Discharge status: Home / Self Care | Source: Ambulatory Visit | Attending: Family | Admitting: Family

## 2024-07-26 ENCOUNTER — Ambulatory Visit: Payer: Self-pay

## 2024-07-26 VITALS — BP 130/80 | HR 81 | Temp 97.7°F | Resp 17 | Ht 60.0 in | Wt 110.8 lb

## 2024-07-26 DIAGNOSIS — M25512 Pain in left shoulder: Secondary | ICD-10-CM

## 2024-07-26 DIAGNOSIS — M79602 Pain in left arm: Secondary | ICD-10-CM

## 2024-07-26 DIAGNOSIS — K5901 Slow transit constipation: Secondary | ICD-10-CM | POA: Diagnosis not present

## 2024-07-26 DIAGNOSIS — M25532 Pain in left wrist: Secondary | ICD-10-CM

## 2024-07-26 DIAGNOSIS — R5381 Other malaise: Secondary | ICD-10-CM | POA: Diagnosis not present

## 2024-07-26 DIAGNOSIS — I5032 Chronic diastolic (congestive) heart failure: Secondary | ICD-10-CM | POA: Diagnosis not present

## 2024-07-26 DIAGNOSIS — I480 Paroxysmal atrial fibrillation: Secondary | ICD-10-CM

## 2024-07-26 NOTE — Telephone Encounter (Signed)
 See triage notes from triage nurse.

## 2024-07-26 NOTE — Progress Notes (Signed)
 "  Provider: Taysia Rivere FNP-C  Meyer Arora, Roxan BROCKS, NP  Patient Care Team: Latonja Bobeck, Roxan BROCKS, NP as PCP - General (Family Medicine) Jordan, Peter M, MD as PCP - Cardiology (Cardiology) Nathanael Deatrice Barrio, MD as Referring Physician  Extended Emergency Contact Information Primary Emergency Contact: [facility] Address: [address] [place], [place] [address] [place] Phone: [phone] Relation: Daughter  Code Status:  DNR Goals of care: Advanced Directive information    05/28/2024    2:05 PM  Advanced Directives  Does Patient Have a Medical Advance Directive? Yes  Type of Estate Agent of [place];Living will;Out of facility DNR (pink MOST or yellow form)  Does patient want to make changes to medical advance directive? No - Patient declined  Copy of Healthcare Power of Attorney in Chart? Yes - validated most recent copy scanned in chart (See row information)     Chief Complaint  Patient presents with   Left arm swelling    Pain & bruising.Would like to discuss hospice care.    History of Present Illness   Barbara Mcconnell is an 89 year old female with congestive heart failure and atrial fibrillation who presents with severe bruising and pain in her left arm. She is accompanied by her daughter, who is her primary caregiver.  She has severe bruising and pain in her left arm, which began approximately five to seven days ago. The bruising extends from the shoulder down to the hand, with no recollection of specific trauma. She is on Xarelto , a blood thinner, which may have contributed to the severity of the bruising. The pain is rated as an eight out of ten, particularly when moving the arm, and is associated with significant swelling and limited mobility. Tylenol  has been used for pain relief, but it has been only partially effective.  She has a history of congestive heart failure and atrial fibrillation, for which she is on  Xarelto . She also uses oxygen, prescribed for use during ambulation, but finds it difficult to manage the portable machine while walking. She experiences shortness of breath. She has been resistant to using a rollator due to concerns about stability and changes in her assisted living environment.  She has a history of a previous fracture in her ankle, which occurred with minimal trauma, indicating possible bone fragility. She also experiences constipation and takes Colace daily, with occasional use of Miralax . She manages her medications with assistance from her daughter and prefers not to have changes made to her current regimen without her primary doctor's involvement.  In the review of symptoms, she reports increased shortness of breath, occasional palpitations, and confusion, which may be related to her oxygen use. She does not recall any recent falls or trauma to the arm. She also reports difficulty with swallowing pills, sometimes crushing them, which is not recommended for all her medications.   Past Medical History:  Diagnosis Date   Allergies    Anxiety    panic attacks   Aortic stenosis    Arthritis    Bronchiectasis with (acute) exacerbation (HCC)    CAD (coronary artery disease)    non obst CAD   Depression due to acute stroke (HCC)    Dizziness    Elevated liver enzymes    Fall    Fluid overload    GERD (gastroesophageal reflux disease)    Heart murmur    Hemorrhoids    History of mammogram  Hyperlipemia    Hypertension    Internal carotid artery stenosis    OCD (obsessive compulsive disorder)    Orthostatic hypotension    Other cirrhosis of liver (HCC)    PAF (paroxysmal atrial fibrillation) (HCC)    Pericardial effusion    Physical deconditioning    Renal insufficiency    Rheumatic heart failure (congestive) (HCC)    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 Wendel & Dr. Lucas    Seasonal allergies    Severe aortic stenosis    Stroke [facility])    Takotsubo cardiomyopathy    Past Surgical History:  Procedure Laterality Date   COLONOSCOPY     EYE SURGERY Bilateral    cataracts removed   INTRAOPERATIVE TRANSTHORACIC ECHOCARDIOGRAM N/A 08/03/2021   Procedure: INTRAOPERATIVE TRANSTHORACIC ECHOCARDIOGRAM;  Surgeon: Thukkani, Arun K, MD;  Location: [facility] INVASIVE CV LAB;  Service: Open Heart Surgery;  Laterality: N/A;   IR CT HEAD LTD  03/25/2022   IR PERCUTANEOUS ART THROMBECTOMY/INFUSION INTRACRANIAL INC DIAG ANGIO  03/25/2022   IR US  GUIDE VASC ACCESS RIGHT  03/25/2022   PERICARDIOCENTESIS N/A 07/28/2021   Procedure: PERICARDIOCENTESIS;  Surgeon: Wendel Lurena POUR, MD;  Location: [facility] INVASIVE CV LAB;  Service: Cardiovascular;  Laterality: N/A;   RADIOLOGY WITH ANESTHESIA N/A 03/25/2022   Procedure: IR WITH ANESTHESIA;  Surgeon: Radiologist, Medication, MD;  Location: [facility] OR;  Service: Radiology;  Laterality: N/A;   RIGHT/LEFT HEART CATH AND CORONARY ANGIOGRAPHY N/A 05/13/2019   Procedure: RIGHT/LEFT HEART CATH AND CORONARY ANGIOGRAPHY;  Surgeon: Jordan, Peter M, MD;  Location: [facility] INVASIVE CV LAB;  Service: Cardiovascular;  Laterality: N/A;   RIGHT/LEFT HEART CATH AND CORONARY ANGIOGRAPHY N/A 07/23/2021   Procedure: RIGHT/LEFT HEART CATH AND CORONARY ANGIOGRAPHY;  Surgeon: Dann Candyce RAMAN, MD;  Location: [facility] INVASIVE CV LAB;  Service: Cardiovascular;  Laterality: N/A;   TONSILLECTOMY     TRANSCATHETER AORTIC VALVE REPLACEMENT, TRANSFEMORAL N/A 08/03/2021   Procedure: TRANSCATHETER AORTIC VALVE REPLACEMENT, TRANSFEMORAL;  Surgeon: Thukkani, Arun K, MD;  Location: [facility] INVASIVE CV LAB;  Service: Open Heart Surgery;  Laterality: N/A;   UPPER GI ENDOSCOPY      Allergies[1]  Outpatient Encounter Medications as of 07/26/2024  Medication Sig   acetaminophen  (TYLENOL ) 500 MG tablet Take 500-1,000 mg by mouth every 6 (six) hours as needed for moderate pain.   albuterol  (VENTOLIN  HFA) 108  (90 Base) MCG/ACT inhaler Inhale 2 puffs into the lungs every 4 (four) hours as needed for wheezing or shortness of breath (seasonal allergies).   docusate sodium  (COLACE) 100 MG capsule TAKE (1) CAPSULE BY MOUTH ONCE DAILY FOR CONSTIPATION.   escitalopram  (LEXAPRO ) 10 MG tablet TAKE (1) TABLET BY MOUTH ONCE DAILY.   Ferrous Sulfate  (IRON ) 325 (65 Fe) MG TABS TAKE (1) TABLET BY MOUTH once a WEEK.   fexofenadine  (ALLEGRA ) 60 MG tablet TAKE (1) TABLET BY MOUTH ONCE EVERY MORNING.   furosemide  (LASIX ) 20 MG tablet TAKE (1/2) TABLET (10MG ) BY MOUTH ONCE DAILY IN THE MORNING.   HEARTBURN RELIEF 10 MG tablet TAKE (1) TABLET BY MOUTH TWICE DAILY FOR GERD.   Lidocaine  HCl (ASPERCREME LIDOCAINE ) 4 % CREA Apply 1 Application topically daily as needed.   lisinopril  (ZESTRIL ) 10 MG tablet TAKE (1) TABLET BY MOUTH TWICE DAILY.   montelukast  (SINGULAIR ) 10 MG tablet TAKE (1) TABLET BY MOUTH ONCE DAILY.   Multiple Vitamins-Minerals (EMERGEN-C VITAMIN C PO) Take 1 packet by  mouth daily. Mix with water   nebivolol  (BYSTOLIC ) 2.5 MG tablet TAKE (1) TABLET BY MOUTH ONCE DAILY.   oxymetazoline  (AFRIN) 0.05 % nasal spray USE (2) SPRAYS IN EACH NOSTRIL TWICE DAILY AS NEEDED FOR NOSE BLEED.   polyethylene glycol powder (GLYCOLAX /MIRALAX ) 17 GM/SCOOP powder Take 17 g by mouth daily as needed for moderate constipation. Hold for loose stool   rosuvastatin  (CRESTOR ) 20 MG tablet TAKE (1) TABLET BY MOUTH AT BEDTIME.   Trolamine Salicylate (ASPERCREME EX) Apply 1 application topically daily as needed (knee pain).   XARELTO  15 MG TABS tablet TAKE (1) TABLET BY MOUTH ONCE DAILY.   Multiple Vitamins-Minerals (EMERGEN-C VITAMIN C) PACK MIX CONTENTS OF (1) PACKET WITH 4-6 OUNCES OF WATER, STIR, THEN DRINK ONCE DAILY. (Patient not taking: Reported on 07/26/2024)   [DISCONTINUED] ACID REDUCER 10 MG tablet TAKE (1) TABLET BY MOUTH TWICE DAILY FOR GERD.   [DISCONTINUED] fexofenadine  (ALLEGRA ) 60 MG tablet TAKE (1) TABLET BY MOUTH ONCE  EVERY MORNING.   [DISCONTINUED] rosuvastatin  (CRESTOR ) 20 MG tablet TAKE (1) TABLET BY MOUTH AT BEDTIME.   No facility-administered encounter medications on file as of 07/26/2024.    Review of Systems  Constitutional:  Negative for appetite change, chills, fatigue, fever and unexpected weight change.  HENT:  Negative for congestion, ear discharge, ear pain, hearing loss, nosebleeds, postnasal drip, rhinorrhea, sinus pressure, sinus pain, sneezing, sore throat, tinnitus and trouble swallowing.   Eyes:  Negative for pain, discharge, redness, itching and visual disturbance.  Respiratory:  Negative for cough, chest tightness, shortness of breath and wheezing.        Chronic dyspnea on exertion   Cardiovascular:  Negative for chest pain, palpitations and leg swelling.  Gastrointestinal:  Negative for abdominal distention, abdominal pain, blood in stool, constipation, diarrhea, nausea and vomiting.  Endocrine: Negative for cold intolerance, heat intolerance, polydipsia, polyphagia and polyuria.  Genitourinary:  Negative for difficulty urinating, dysuria, flank pain, frequency and urgency.  Musculoskeletal:  Positive for arthralgias and gait problem. Negative for back pain, joint swelling, myalgias, neck pain and neck stiffness.       Left shoulder/forearm/wrist pain   Skin:  Negative for color change, pallor, rash and wound.       Bruise on left shoulder/forearm   Neurological:  Negative for dizziness, syncope, speech difficulty, weakness, light-headedness, numbness and headaches.  Hematological:  Does not bruise/bleed easily.  Psychiatric/Behavioral:  Negative for agitation, behavioral problems, confusion, hallucinations and sleep disturbance. The patient is nervous/anxious.     Immunization History  Administered Date(s) Administered   Fluad Quad(high Dose 65+) 04/07/2019, 05/13/2022   Fluad Trivalent(High Dose 65+) 04/18/2023   INFLUENZA, HIGH DOSE SEASONAL PF 04/20/2017, 04/07/2019,  03/25/2020, 04/27/2021   Influenza Split 05/10/2006, 04/25/2007, 04/08/2008, 11/10/2008   Influenza Whole 04/25/2023   Influenza-Unspecified 05/10/2006, 04/25/2007, 04/08/2008, 07/09/2008, 11/10/2008, 03/31/2015, 04/20/2018, 04/07/2019, 05/15/2024   PFIZER Comirnaty ETTERGray Top)Covid-19 Tri-Sucrose Vaccine 08/14/2019, 09/04/2019, 04/20/2020, 10/25/2020, 04/12/2021   PPD Test 05/12/2022   Pfizer(Comirnaty )Fall Seasonal Vaccine 12 years and older 04/07/2023   Pneumococcal Conjugate-13 09/29/2015   Pneumococcal Polysaccharide-23 03/12/2007   Respiratory Syncytial Virus Vaccine ,Recomb Aduvanted(Arexvy ) 05/09/2023   Tdap 07/14/2014   Unspecified SARS-COV-2 Vaccination 06/29/2024   Zoster Recombinant(Shingrix) 12/10/2019, 02/08/2020   Zoster, Live 08/18/2004, 01/17/2007   Pertinent  Health Maintenance Due  Topic Date Due   Bone Density Scan  12/04/2024 (Originally 02/25/2002)   Influenza Vaccine  Completed      09/05/2023    9:53 AM 09/05/2023   10:17 AM 12/04/2023   11:02  AM 12/05/2023    1:24 PM 05/28/2024    2:03 PM  Fall Risk  Falls in the past year? 0 0 1  1 0  Was there an injury with Fall? 0  0  1    0   Fall Risk Category Calculator 0 0 2   0  Patient at Risk for Falls Due to No Fall Risks    No Fall Risks  Fall risk Follow up Falls evaluation completed    Falls evaluation completed     Manually entered by patient   Data saved with a previous flowsheet row definition   Functional Status Survey:    Vitals:   07/26/24 0940  BP: 130/80  Pulse: 81  Resp: 17  Temp: 97.7 F (36.5 C)  SpO2: 97%  Weight: 110 lb 12.8 oz (50.3 kg)  Height: 5' (1.524 m)  PF: (!) 2 L/min   Body mass index is 21.64 kg/m. Physical Exam  VITALS: T- 97.7, P- 81, BP- 130/80, SaO2- 97% on 2 liters via nasal cannula  GENERAL: Alert, cooperative, well developed, no acute distress. HEENT: Normocephalic, normal oropharynx, moist mucous membranes. CHEST: Clear to auscultation bilaterally, no wheezes,  rhonchi, or crackles. CARDIOVASCULAR: Normal heart rate and rhythm, S1 and S2 normal without murmurs. ABDOMEN: Soft, non-tender, non-distended, without organomegaly, normal bowel sounds. EXTREMITIES: Bruising and swelling on left arm extending to shoulder, limited mobility in left arm due to pain, no bruising on hips, no cyanosis or edema. NEUROLOGICAL: Cranial nerves grossly intact, moves all extremities without gross motor or sensory deficit.  SKIN: No rash,no lesion or erythema.dark purplish bruise on entire left shoulder/upper arm/forearm   PSYCHIATRY/BEHAVIORAL: Mood anxious.impaired Memory loss    Labs reviewed: Recent Labs    02/21/24 1439 05/28/24 1509 06/18/24 1141  NA 133* 130* 135  K 4.8 4.8 4.3  CL 96* 92* 96*  CO2 34* 30 32  GLUCOSE 91 89 69  BUN 17 17 14   CREATININE 0.79 0.92 1.05*  CALCIUM  9.8 9.8 9.7   Recent Labs    12/12/23 1137 05/28/24 1509  AST 54* 52*  ALT 22 26  ALKPHOS 65  --   BILITOT 1.3* 1.3*  PROT 6.9 6.8  ALBUMIN 4.2  --    Recent Labs    12/12/23 1137 02/21/24 1439 05/28/24 1509  WBC 6.7 6.0 8.2  NEUTROABS 4.3 3,108 4,928  HGB 11.7 11.7 12.5  HCT 36.5 37.4 37.5  MCV 93 93.7 89.9  PLT 277 234 302   Lab Results  Component Value Date   TSH 0.90 05/28/2024   Lab Results  Component Value Date   HGBA1C 5.4 03/26/2022   Lab Results  Component Value Date   CHOL 188 05/28/2024   HDL 109 05/28/2024   LDLCALC 63 05/28/2024   TRIG 77 05/28/2024   CHOLHDL 1.7 05/28/2024    Significant Diagnostic Results in last 30 days:  DG Wrist Complete Left Result Date: 07/26/2024 CLINICAL DATA:  Wrist and forearm pain.  Swelling and bruising. EXAM: LEFT WRIST - COMPLETE 3+ VIEW; LEFT FOREARM - 2 VIEW COMPARISON:  None Available. FINDINGS: Wrist: No acute fracture or dislocation. 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 Narrowing of the distal radioulnar joint with subchondral sclerosis. Narrowing of the  radiocarpal joint with subchondral sclerosis and subchondral cystic change. Osteoarthritis of the thumb carpal metacarpal joint. No frank erosions or bony destruction. Mild generalized soft tissue edema. Forearm: No fracture of the radius or ulna. Cortical margins are  intact. Wrist alignment is maintained. Soft tissue edema throughout the forearm, greatest about the ulnar aspect. No soft tissue gas or radiopaque foreign body. IMPRESSION: 1. Soft tissue edema throughout the forearm and wrist, greatest about the ulnar aspect. 2. No acute osseous abnormality of the wrist or forearm. 3. 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 Electronically Signed   By: Andrea Gasman M.D.   On: 07/26/2024 11:39   DG Forearm Left Result Date: 07/26/2024 CLINICAL DATA:  Wrist and forearm pain.  Swelling and bruising. EXAM: LEFT WRIST - COMPLETE 3+ VIEW; LEFT FOREARM - 2 VIEW COMPARISON:  None Available. FINDINGS: Wrist: No acute fracture or dislocation. 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 Narrowing of the distal radioulnar joint with subchondral sclerosis. Narrowing of the radiocarpal joint with subchondral sclerosis and subchondral cystic change. Osteoarthritis of the thumb carpal metacarpal joint. No frank erosions or bony destruction. Mild generalized soft tissue edema. Forearm: No fracture of the radius or ulna. Cortical margins are intact. Wrist alignment is maintained. Soft tissue edema throughout the forearm, greatest about the ulnar aspect. No soft tissue gas or radiopaque foreign body. IMPRESSION: 1. Soft tissue edema throughout the forearm and wrist, greatest about the ulnar aspect. 2. No acute osseous abnormality of the wrist or forearm. 3. 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 Electronically Signed   By: Andrea Gasman M.D.   On: 07/26/2024 11:39   DG Shoulder Left Result Date: 07/26/2024 CLINICAL DATA:  Left shoulder pain.  Left upper extremity  bruising. EXAM: LEFT SHOULDER - 2+ VIEW; LEFT HUMERUS - 2+ VIEW COMPARISON:  None Available. FINDINGS: Shoulder: No acute fracture or dislocation. The alignment is maintained. Minor glenohumeral degenerative change. Slight subcortical cystic change in the lateral humeral head. No erosive or bony destructive changes. Soft tissue thickening is seen laterally. Humerus: No acute fracture. No erosion or bony destruction. No evidence of focal lesion. Elbow alignment is maintained. IMPRESSION: 1. No acute osseous abnormality of the left shoulder or humerus. 2. Minor glenohumeral degenerative change. 3. Soft tissue thickening laterally about the shoulder. Electronically Signed   By: Andrea Gasman M.D.   On: 07/26/2024 11:37   DG Humerus Left Result Date: 07/26/2024 CLINICAL DATA:  Left shoulder pain.  Left upper extremity bruising. EXAM: LEFT SHOULDER - 2+ VIEW; LEFT HUMERUS - 2+ VIEW COMPARISON:  None Available. FINDINGS: Shoulder: No acute fracture or dislocation. The alignment is maintained. Minor glenohumeral degenerative change. Slight subcortical cystic change in the lateral humeral head. No erosive or bony destructive changes. Soft tissue thickening is seen laterally. Humerus: No acute fracture. No erosion or bony destruction. No evidence of focal lesion. Elbow alignment is maintained. IMPRESSION: 1. No acute osseous abnormality of the left shoulder or humerus. 2. Minor glenohumeral degenerative change. 3. Soft tissue thickening laterally about the shoulder. Electronically Signed   By: Andrea Gasman M.D.   On: 07/26/2024 11:37    Assessment/Plan Acute left upper extremity pain and swelling with bruising Acute pain and swelling in the left upper extremity with extensive bruising, likely exacerbated by anticoagulation therapy. Pain level reported as 7-8/10, with limited mobility. Differential includes possible fracture or intramuscular hematoma. Tylenol  provides limited relief. - Ordered x-ray of left  upper extremity including shoulder, arm, and wrist to assess for fracture or other injury. - Continue Tylenol  for pain management, with consideration for scheduled dosing to prevent severe pain episodes. - Referred to hospice for additional care and support.  Chronic diastolic heart failure Chronic  condition with ongoing management. Oxygen therapy prescribed but not used during ambulation due to difficulty managing the portable machine. Oxygen saturation drops during ambulation, indicating potential need for reassessment of oxygen therapy. - Reassessed oxygen levels during ambulation and at rest. - Continue current oxygen therapy regimen, with consideration for alternative oxygen delivery methods if feasible.  Paroxysmal atrial fibrillation Intermittent episodes reported. Current management includes anticoagulation therapy with Xarelto . - Continue current anticoagulation therapy with Xarelto .  chronic respiratory Failure with hypoxia  on portable oxygen 2 liters via nasal cannula. oxygen saturation 97 % with oxygen 2 liters. 89 % on Room air with exertion and 91 % on RA at rest and 93 % with oxygen 2 liters via Nasal cannula on exertion.  - continue on oxygen via nasal cannula  Physical deconditioning Difficulty managing portable oxygen equipment and ambulation. Concerns about fall risk and mobility limitations. - Referred to hospice for additional support and assistance with mobility and daily activities.  Constipation Chronic constipation managed with Colace and Miralax  as needed. Occasional use of Miralax  for symptom relief. - Continue Colace once daily. - Use Miralax  as needed for constipation relief.   Family/ staff Communication: Reviewed plan of care with patient and daughter verbalized understanding  Labs/tests ordered:  Dg shoulder/arm/forearm and wrist   Next Appointment: Return if symptoms worsen or fail to improve.   Total time: 30 minutes. Greater than 50% of total time  spent doing patient education regarding shoulder/arm/forearm/wrist pain,CHF,Afib,chronic respiratory failure with hypoxia, physical deconditioning, constipation and health maintenance including symptom/medication management.   Roxan BROCKS Nikeisha Klutz, NP    [1]  Allergies Allergen Reactions   Codeine Nausea And Vomiting   Cyclobenzaprine  Other (See Comments)    Unknown reaction   Iodinated Contrast Media Nausea Only   Prednisone  Other (See Comments)    Sores in the mouth   "

## 2024-07-26 NOTE — Telephone Encounter (Signed)
 Noted.will evaluate symptoms during visit.

## 2024-07-26 NOTE — Telephone Encounter (Signed)
 Daughter wants to discuss Hospice and patient's current medications as well at this appointment.  FYI Only or Action Required?: FYI only for provider: appointment scheduled on 07/26/2024 at 9:40am with patient's PCP Dinah Ngetich NP.  Patient was last seen in primary care on 06/18/2024 by Ngetich, Roxan BROCKS, NP.  Called Nurse Triage reporting Arm Swelling.  Symptoms began several days ago.  Interventions attempted: OTC medications: Tylenol  and Rest, hydration, or home remedies.  Symptoms are: gradually worsening.  Triage Disposition: See PCP Within 2 Weeks  Patient/caregiver understands and will follow disposition?: Yes               Copied from CRM #[id number]. Topic: Clinical - Red Word Triage >> Jul 26, 2024  8:21 AM Diannia H wrote: Red Word that prompted transfer to Nurse Triage: Hematoma on left arm from shoulder to wrist, very painful,  Hx of being on blood thinners.  Edema in both ankles. The patient is getting worse since sending a red word on 12/31 and her daughter Silvano is also wanting a referral for hospice. Reason for Disposition  Requesting regular office appointment  Answer Assessment - Initial Assessment Questions Patient's daughter, Power of Gabriella, called and states that she has been calling since 12/31 She was advised to call [facility] (palliative and hospice care agency)--patient has a M.O.S.T. form that states she wants comfort measures and does not want to go to the hospital. They decided not to take the patient to Urgent Care. [facility] advised patient to convert patient to Hospice Care due to further decline in the past two weeks--PCP or a provider at PCP office has to create the order for Hospice and patient is on a blood thinner & left arm has a black/blue bruise with limited mobility and pain. Daughter believes patient hit this left arm. Patient is on Xarelto . She states the patient has periodic low oxygen so she cannot remember exactly what  happened to this arm. Daughter is not with the patient at this time--patient is at an Assisted Living Facility. On 07/24/2024--they were advised to be seen within 4 hours for the pain in this left arm with the hematoma. Daughter thinks this arm may possibly be injured. Daughter also wants to discuss if the patient needs to be on all the medications that she is on at this time. Daughter saw and spoke with the patient yesterday--and she states the bruising and swelling of the left arm has gotten better but she is still in pain and having a limited mobility.  The only medication she is able to take is Tylenol  as needed. Daughter is given an appointment for the patient today to see patient's PCP.  She is advised if anything changes to call us  back Daughter verbalized understanding.  Protocols used: Information Only Call - No Triage-A-AH

## 2024-07-26 NOTE — Telephone Encounter (Signed)
 Noted

## 2024-08-07 ENCOUNTER — Encounter: Payer: Self-pay | Admitting: Cardiology

## 2024-09-03 ENCOUNTER — Ambulatory Visit: Admitting: Cardiology

## 2024-09-24 ENCOUNTER — Ambulatory Visit: Admitting: Family

## 2024-12-04 ENCOUNTER — Ambulatory Visit: Payer: Self-pay | Admitting: Family

## 2024-12-10 ENCOUNTER — Ambulatory Visit: Admitting: Family
